# Patient Record
Sex: Female | Born: 1945 | Race: Black or African American | Hispanic: No | Smoking: Former smoker
Health system: Southern US, Community
[De-identification: ages and names within clinical notes are randomized; demographics above are authoritative.]

## PROBLEM LIST (undated history)

## (undated) DIAGNOSIS — I639 Cerebral infarction, unspecified: Secondary | ICD-10-CM

## (undated) DIAGNOSIS — Z9981 Dependence on supplemental oxygen: Secondary | ICD-10-CM

## (undated) DIAGNOSIS — I5030 Unspecified diastolic (congestive) heart failure: Secondary | ICD-10-CM

## (undated) DIAGNOSIS — I4892 Unspecified atrial flutter: Secondary | ICD-10-CM

## (undated) DIAGNOSIS — M797 Fibromyalgia: Secondary | ICD-10-CM

## (undated) DIAGNOSIS — K598 Other specified functional intestinal disorders: Secondary | ICD-10-CM

## (undated) DIAGNOSIS — K219 Gastro-esophageal reflux disease without esophagitis: Secondary | ICD-10-CM

## (undated) DIAGNOSIS — F329 Major depressive disorder, single episode, unspecified: Secondary | ICD-10-CM

## (undated) DIAGNOSIS — E785 Hyperlipidemia, unspecified: Secondary | ICD-10-CM

## (undated) DIAGNOSIS — M1990 Unspecified osteoarthritis, unspecified site: Secondary | ICD-10-CM

## (undated) DIAGNOSIS — M545 Low back pain, unspecified: Secondary | ICD-10-CM

## (undated) DIAGNOSIS — J189 Pneumonia, unspecified organism: Secondary | ICD-10-CM

## (undated) DIAGNOSIS — I509 Heart failure, unspecified: Secondary | ICD-10-CM

## (undated) DIAGNOSIS — J42 Unspecified chronic bronchitis: Secondary | ICD-10-CM

## (undated) DIAGNOSIS — E039 Hypothyroidism, unspecified: Secondary | ICD-10-CM

## (undated) DIAGNOSIS — N281 Cyst of kidney, acquired: Secondary | ICD-10-CM

## (undated) DIAGNOSIS — I219 Acute myocardial infarction, unspecified: Secondary | ICD-10-CM

## (undated) DIAGNOSIS — F419 Anxiety disorder, unspecified: Secondary | ICD-10-CM

## (undated) DIAGNOSIS — Z86010 Personal history of colonic polyps: Secondary | ICD-10-CM

## (undated) DIAGNOSIS — H348122 Central retinal vein occlusion, left eye, stable: Secondary | ICD-10-CM

## (undated) DIAGNOSIS — G8929 Other chronic pain: Secondary | ICD-10-CM

## (undated) DIAGNOSIS — G43909 Migraine, unspecified, not intractable, without status migrainosus: Secondary | ICD-10-CM

## (undated) DIAGNOSIS — J449 Chronic obstructive pulmonary disease, unspecified: Secondary | ICD-10-CM

## (undated) DIAGNOSIS — G4730 Sleep apnea, unspecified: Secondary | ICD-10-CM

## (undated) DIAGNOSIS — F32A Depression, unspecified: Secondary | ICD-10-CM

## (undated) DIAGNOSIS — I63512 Cerebral infarction due to unspecified occlusion or stenosis of left middle cerebral artery: Secondary | ICD-10-CM

## (undated) DIAGNOSIS — R0602 Shortness of breath: Secondary | ICD-10-CM

## (undated) DIAGNOSIS — E049 Nontoxic goiter, unspecified: Secondary | ICD-10-CM

## (undated) DIAGNOSIS — I10 Essential (primary) hypertension: Secondary | ICD-10-CM

## (undated) DIAGNOSIS — N182 Chronic kidney disease, stage 2 (mild): Secondary | ICD-10-CM

## (undated) HISTORY — PX: TONSILLECTOMY: SUR1361

## (undated) HISTORY — PX: CARPAL TUNNEL RELEASE: SHX101

## (undated) HISTORY — PX: TOTAL KNEE ARTHROPLASTY: SHX125

## (undated) HISTORY — PX: VAGINAL HYSTERECTOMY: SUR661

## (undated) HISTORY — PX: CHOLECYSTECTOMY OPEN: SUR202

## (undated) HISTORY — PX: LUMBAR DISC SURGERY: SHX700

## (undated) HISTORY — PX: BREAST BIOPSY: SHX20

## (undated) HISTORY — PX: JOINT REPLACEMENT: SHX530

## (undated) HISTORY — PX: POSTERIOR LUMBAR FUSION: SHX6036

## (undated) HISTORY — PX: ANTERIOR CERVICAL DECOMP/DISCECTOMY FUSION: SHX1161

## (undated) HISTORY — DX: Unspecified atrial flutter: I48.92

## (undated) HISTORY — PX: CARDIAC CATHETERIZATION: SHX172

## (undated) HISTORY — DX: Unspecified diastolic (congestive) heart failure: I50.30

## (undated) HISTORY — PX: BACK SURGERY: SHX140

---

## 1997-11-13 ENCOUNTER — Encounter: Payer: Self-pay | Admitting: Neurosurgery

## 1997-11-13 ENCOUNTER — Ambulatory Visit (HOSPITAL_COMMUNITY): Admission: RE | Admit: 1997-11-13 | Discharge: 1997-11-13 | Payer: Self-pay | Admitting: Neurosurgery

## 1997-12-11 ENCOUNTER — Encounter: Payer: Self-pay | Admitting: Neurosurgery

## 1997-12-11 ENCOUNTER — Inpatient Hospital Stay (HOSPITAL_COMMUNITY): Admission: RE | Admit: 1997-12-11 | Discharge: 1997-12-14 | Payer: Self-pay | Admitting: Neurosurgery

## 1998-07-22 ENCOUNTER — Encounter: Payer: Self-pay | Admitting: *Deleted

## 1998-07-22 ENCOUNTER — Ambulatory Visit (HOSPITAL_COMMUNITY): Admission: RE | Admit: 1998-07-22 | Discharge: 1998-07-22 | Payer: Self-pay | Admitting: *Deleted

## 1998-12-10 ENCOUNTER — Ambulatory Visit (HOSPITAL_COMMUNITY): Admission: RE | Admit: 1998-12-10 | Discharge: 1998-12-10 | Payer: Self-pay | Admitting: Gastroenterology

## 1998-12-10 ENCOUNTER — Encounter: Payer: Self-pay | Admitting: Gastroenterology

## 1998-12-17 ENCOUNTER — Ambulatory Visit (HOSPITAL_COMMUNITY): Admission: RE | Admit: 1998-12-17 | Discharge: 1998-12-17 | Payer: Self-pay | Admitting: *Deleted

## 1998-12-17 ENCOUNTER — Encounter: Payer: Self-pay | Admitting: *Deleted

## 1999-03-04 ENCOUNTER — Ambulatory Visit (HOSPITAL_COMMUNITY): Admission: RE | Admit: 1999-03-04 | Discharge: 1999-03-04 | Payer: Self-pay | Admitting: Gastroenterology

## 2001-10-18 ENCOUNTER — Encounter: Payer: Self-pay | Admitting: Cardiovascular Disease

## 2001-10-18 ENCOUNTER — Ambulatory Visit (HOSPITAL_COMMUNITY): Admission: RE | Admit: 2001-10-18 | Discharge: 2001-10-18 | Payer: Self-pay | Admitting: Cardiovascular Disease

## 2001-11-08 ENCOUNTER — Encounter: Payer: Self-pay | Admitting: Surgery

## 2001-11-10 ENCOUNTER — Encounter: Payer: Self-pay | Admitting: Surgery

## 2001-11-10 ENCOUNTER — Ambulatory Visit (HOSPITAL_COMMUNITY): Admission: AD | Admit: 2001-11-10 | Discharge: 2001-11-11 | Payer: Self-pay | Admitting: Surgery

## 2001-11-10 ENCOUNTER — Encounter (INDEPENDENT_AMBULATORY_CARE_PROVIDER_SITE_OTHER): Payer: Self-pay | Admitting: *Deleted

## 2002-06-06 ENCOUNTER — Encounter: Payer: Self-pay | Admitting: Orthopedic Surgery

## 2002-06-08 ENCOUNTER — Inpatient Hospital Stay (HOSPITAL_COMMUNITY): Admission: RE | Admit: 2002-06-08 | Discharge: 2002-06-13 | Payer: Self-pay | Admitting: Orthopedic Surgery

## 2002-06-08 ENCOUNTER — Encounter: Payer: Self-pay | Admitting: Orthopedic Surgery

## 2003-09-03 ENCOUNTER — Ambulatory Visit (HOSPITAL_COMMUNITY): Admission: RE | Admit: 2003-09-03 | Discharge: 2003-09-03 | Payer: Self-pay | Admitting: *Deleted

## 2003-09-14 ENCOUNTER — Ambulatory Visit (HOSPITAL_COMMUNITY): Admission: RE | Admit: 2003-09-14 | Discharge: 2003-09-14 | Payer: Self-pay | Admitting: *Deleted

## 2004-01-29 ENCOUNTER — Encounter: Admission: RE | Admit: 2004-01-29 | Discharge: 2004-01-29 | Payer: Self-pay | Admitting: Orthopedic Surgery

## 2004-02-17 HISTORY — PX: COLONOSCOPY: SHX174

## 2004-02-17 HISTORY — PX: ESOPHAGOGASTRODUODENOSCOPY: SHX1529

## 2004-02-21 ENCOUNTER — Ambulatory Visit: Payer: Self-pay | Admitting: Internal Medicine

## 2004-02-27 ENCOUNTER — Ambulatory Visit: Payer: Self-pay | Admitting: Internal Medicine

## 2004-02-27 ENCOUNTER — Ambulatory Visit (HOSPITAL_COMMUNITY): Admission: RE | Admit: 2004-02-27 | Discharge: 2004-02-27 | Payer: Self-pay | Admitting: Internal Medicine

## 2004-05-13 ENCOUNTER — Encounter: Admission: RE | Admit: 2004-05-13 | Discharge: 2004-05-13 | Payer: Self-pay | Admitting: Orthopedic Surgery

## 2004-05-14 ENCOUNTER — Ambulatory Visit (HOSPITAL_BASED_OUTPATIENT_CLINIC_OR_DEPARTMENT_OTHER): Admission: RE | Admit: 2004-05-14 | Discharge: 2004-05-14 | Payer: Self-pay | Admitting: Orthopedic Surgery

## 2004-05-14 ENCOUNTER — Encounter (INDEPENDENT_AMBULATORY_CARE_PROVIDER_SITE_OTHER): Payer: Self-pay | Admitting: Specialist

## 2004-05-14 ENCOUNTER — Ambulatory Visit (HOSPITAL_COMMUNITY): Admission: RE | Admit: 2004-05-14 | Discharge: 2004-05-14 | Payer: Self-pay | Admitting: Orthopedic Surgery

## 2005-03-05 ENCOUNTER — Ambulatory Visit: Payer: Self-pay | Admitting: Psychology

## 2005-08-31 ENCOUNTER — Encounter: Admission: RE | Admit: 2005-08-31 | Discharge: 2005-09-30 | Payer: Self-pay | Admitting: Neurology

## 2005-09-04 ENCOUNTER — Ambulatory Visit (HOSPITAL_COMMUNITY): Admission: RE | Admit: 2005-09-04 | Discharge: 2005-09-04 | Payer: Self-pay | Admitting: *Deleted

## 2006-02-15 ENCOUNTER — Encounter: Admission: RE | Admit: 2006-02-15 | Discharge: 2006-02-15 | Payer: Self-pay | Admitting: Neurology

## 2006-06-16 ENCOUNTER — Ambulatory Visit (HOSPITAL_COMMUNITY): Admission: RE | Admit: 2006-06-16 | Discharge: 2006-06-16 | Payer: Self-pay | Admitting: Neurosurgery

## 2006-07-22 ENCOUNTER — Inpatient Hospital Stay (HOSPITAL_COMMUNITY): Admission: RE | Admit: 2006-07-22 | Discharge: 2006-07-27 | Payer: Self-pay | Admitting: Neurosurgery

## 2007-04-19 ENCOUNTER — Ambulatory Visit: Payer: Self-pay | Admitting: Cardiology

## 2007-04-20 ENCOUNTER — Encounter: Payer: Self-pay | Admitting: Cardiology

## 2007-07-05 ENCOUNTER — Emergency Department (HOSPITAL_COMMUNITY): Admission: EM | Admit: 2007-07-05 | Discharge: 2007-07-05 | Payer: Self-pay | Admitting: Emergency Medicine

## 2007-07-25 ENCOUNTER — Ambulatory Visit (HOSPITAL_COMMUNITY): Admission: RE | Admit: 2007-07-25 | Discharge: 2007-07-25 | Payer: Self-pay | Admitting: Neurosurgery

## 2008-02-13 ENCOUNTER — Ambulatory Visit (HOSPITAL_COMMUNITY): Admission: RE | Admit: 2008-02-13 | Discharge: 2008-02-13 | Payer: Self-pay | Admitting: *Deleted

## 2009-02-16 HISTORY — PX: COLONOSCOPY: SHX174

## 2009-03-07 ENCOUNTER — Ambulatory Visit (HOSPITAL_COMMUNITY): Admission: RE | Admit: 2009-03-07 | Discharge: 2009-03-07 | Payer: Self-pay | Admitting: *Deleted

## 2009-04-19 ENCOUNTER — Encounter: Payer: Self-pay | Admitting: Internal Medicine

## 2009-04-23 ENCOUNTER — Ambulatory Visit (HOSPITAL_COMMUNITY): Admission: RE | Admit: 2009-04-23 | Discharge: 2009-04-23 | Payer: Self-pay | Admitting: Internal Medicine

## 2009-04-23 ENCOUNTER — Ambulatory Visit: Payer: Self-pay | Admitting: Internal Medicine

## 2009-04-25 ENCOUNTER — Encounter: Payer: Self-pay | Admitting: Internal Medicine

## 2010-03-09 ENCOUNTER — Encounter: Payer: Self-pay | Admitting: Neurology

## 2010-03-20 NOTE — Letter (Signed)
Summary: Patient Notice, Colon Biopsy Results  [facility]  [address]   [place], [place] [address]   Phone: [phone]  Fax: [phone]       April 25, 2009   Holly Flores [address] [place], [place]  [address] [birth year]    Dear Ms. Habermann,  I am pleased to inform you that the biopsies taken during your recent colonoscopy did not show any evidence of cancer upon pathologic examination.  Additional information/recommendations:  You should have a repeat colonoscopy examination  in 5 years.  Please call us if you are having persistent problems or have questions about your condition that have not been fully answered at this time.  Sincerely,    R. Roetta Sessions MD  [facility] Ph: [phone]    Fax: [phone]   Appended Document: Patient Notice, Colon Biopsy Results Letter mailed.

## 2010-03-20 NOTE — Letter (Signed)
Summary: TRIAGE  TRIAGE   Imported By: Diana Eves 04/19/2009 11:45:05  _____________________________________________________________________  External Attachment:    Type:   Image     Comment:   External Document

## 2010-03-27 ENCOUNTER — Other Ambulatory Visit: Payer: Self-pay

## 2010-03-27 DIAGNOSIS — Z1231 Encounter for screening mammogram for malignant neoplasm of breast: Secondary | ICD-10-CM

## 2010-04-02 ENCOUNTER — Ambulatory Visit (HOSPITAL_COMMUNITY)
Admission: RE | Admit: 2010-04-02 | Discharge: 2010-04-02 | Disposition: A | Discharge status: Home / Self Care | Payer: MEDICARE | Source: Ambulatory Visit | Attending: *Deleted | Admitting: *Deleted

## 2010-04-02 DIAGNOSIS — Z1231 Encounter for screening mammogram for malignant neoplasm of breast: Secondary | ICD-10-CM | POA: Insufficient documentation

## 2010-06-12 ENCOUNTER — Other Ambulatory Visit: Payer: Self-pay | Admitting: Urology

## 2010-06-12 DIAGNOSIS — D4959 Neoplasm of unspecified behavior of other genitourinary organ: Secondary | ICD-10-CM

## 2010-06-17 ENCOUNTER — Ambulatory Visit
Admission: RE | Admit: 2010-06-17 | Discharge: 2010-06-17 | Disposition: A | Discharge status: Home / Self Care | Payer: MEDICARE | Source: Ambulatory Visit | Attending: Urology | Admitting: Urology

## 2010-06-17 DIAGNOSIS — D4959 Neoplasm of unspecified behavior of other genitourinary organ: Secondary | ICD-10-CM

## 2010-06-17 MED ORDER — GADOBENATE DIMEGLUMINE 529 MG/ML IV SOLN
20.0000 mL | Freq: Once | INTRAVENOUS | Status: AC | PRN
  Administered 2010-06-17: 20 mL via INTRAVENOUS

## 2010-06-20 ENCOUNTER — Other Ambulatory Visit (HOSPITAL_COMMUNITY): Payer: Self-pay | Admitting: *Deleted

## 2010-06-20 ENCOUNTER — Other Ambulatory Visit: Payer: Self-pay | Admitting: Urology

## 2010-06-20 DIAGNOSIS — Z1231 Encounter for screening mammogram for malignant neoplasm of breast: Secondary | ICD-10-CM

## 2010-06-21 ENCOUNTER — Ambulatory Visit
Admission: RE | Admit: 2010-06-21 | Discharge: 2010-06-21 | Disposition: A | Discharge status: Home / Self Care | Payer: MEDICARE | Source: Ambulatory Visit | Attending: Urology | Admitting: Urology

## 2010-06-21 MED ORDER — GADOBENATE DIMEGLUMINE 529 MG/ML IV SOLN
20.0000 mL | Freq: Once | INTRAVENOUS | Status: AC | PRN
  Administered 2010-06-21: 20 mL via INTRAVENOUS

## 2010-07-01 NOTE — Op Note (Signed)
NAMEBASYA, CASAVANT NO.:  [id number]   MEDICAL RECORD NO.:  [id number]          PATIENT TYPE:  INP   LOCATION:  3004                         FACILITY:  [facility]   PHYSICIAN:  Danae Orleans. Venetia Maxon, M.D.  DATE OF BIRTH:  1945-09-10   DATE OF PROCEDURE:  07/22/2006  DATE OF DISCHARGE:                               OPERATIVE REPORT   PREOPERATIVE DIAGNOSIS:  Status post L4-5 fusion with L4, 5, S1  spondylolisthesis, spondylosis, degenerative disease and radiculopathy  and low back pain.   POSTOPERATIVE DIAGNOSIS:  Status post L4-5 fusion with L4, 5, S1  spondylolisthesis, spondylosis, degenerative disease and radiculopathy  and low back pain.   PROCEDURE:  1. Exploration of L4-5 fusion.  2. Decompression at L5-S1.  3. Posterior lumbar interbody fusion with PEEK interbody cages with      morselized bone autograft and Osteocell.  4. Pedicle screw fixation nonsegmental instrumentation L5-S1 levels.  5. Posterolateral arthrodesis L5-S1 levels.  6. Nerve monitoring, EMG nerve monitoring.   SURGEON:  Danae Orleans. Venetia Maxon, M.D.   ASSISTANT:  Hilda Lias, M.D. and Georgiann Cocker, RN   ANESTHESIA:  General endotracheal anesthesia.   BLOOD LOSS:  150 mL.   COMPLICATIONS:  None.   DISPOSITION:  Recovery.   INDICATIONS:  Isyss Espinal is a 65 year old woman previously  undergone L4-5 Ray threaded fusion cage surgery who now has developed  back pain, spondylolisthesis of L5 and S1, spondylosis, degenerative  disk disease, radiculopathy.  Was elected to take her to surgery for  decompression and fusion.   PROCEDURE:  Ms. Romeo Apple was brought to the operating room.  Following  satisfactory and uncomplicated option of general endotracheal  anesthesia, placement intravenous lines, and Foley catheter the patient  was placed in a prone position on the [place] table.  Her low back was  then prepped and draped in the usual sterile fashion.  Area of planned  incision was  infiltrated with 0.25% Marcaine and 0.5% lidocaine with  1:200,000 epinephrine.  Incision was made midline, carried through  copious adipose tissue to the lumbodorsal fascia.  Subperiosteal  dissection was performed exposing the L5 transverse process, the sacral  alae bilaterally.  Self-retaining retractor was placed facilitate  exposure.  The intraoperative x-ray was obtained confirming correct  level of the L5-S1 level.  There was significant degenerative changes at  the L5-S1 level with marked spondylosis and facet arthropathic changes  with significant ligamentous hypertrophy.  The L4-5 level was explored.  There was no evidence of motion on moving spinous processes.  There  appeared to be solid arthrodesis at this level.  The removal of spinous  process was then performed of L5. The ligamentous tissue was highly  hypertrophied, this was removed and a dilator probe was inserted  initially on the left side to the pedicle and then out the neural  foramen and subsequently a similar probe was placed on the right side to  access the foramen.  The facetectomy was then completed with removal of  the medial aspect of the facet joint at L5-S1 bilaterally and the  superior articular process of the L5.  The S1 facette was removed with  resultant decompression of the both the L5 and S1 nerve roots.  A  diskectomy was then performed on the left side of midline and the  interspace was aggressively cleared of disk material.  Endplates were  prepared for later grafting.  After trial sizing a 10-mm interbody  spacer was then placed.  A trial spacer was then placed and subsequent  diskectomy was performed on the right side of midline as well.  Subsequently after a thorough diskectomy was performed endplate  preparation, 10-mm PEEK interbody cage was packed with morcellized bone  autograft and also Osteocell.  This was inserted the interspace  countersunk appropriately.  Additional bone material was  placed within  the interspace and subsequently tamped into position and subsequently on  the left side of midline a similarly sized cage was placed and tamped  into position.  Additional bone graft was placed overlying the L5-S1  level was then decorticated and pedicle screw fixation was placed at 6.5  x 40 mm sacral screws bilaterally and 6.5 x 45-mm screws at L5.  EMG  monitoring demonstrated no evidence of any cutouts were suggestion of  nerve root irritability.  The positioning of the screws was confirmed on  AP and lateral fluoroscopy.  The remaining bone graft was placed  posterolateral region, tamped into position.  40-mm rods were placed  overlying the screws and locked down in situ.  The wound was irrigated  and soft tissues inspected and found to be in good repair.  The  lumbodorsal fascia was then closed with 1-0 Vicryl sutures, subcutaneous  tissues were approximated 2-0 Vicryl interrupted inverted sutures and  skin edges were approximated 3-0 Vicryl subcu stitch.  Wound was dressed  with Benzoin, Steri-Strips soft gauze and tape.  The patient was  extubated in the operating, taken to recovery in stable satisfactory  condition having tolerated operation well.  Counts correct at end the  case.      Danae Orleans. Venetia Maxon, M.D.  Electronically Signed     JDS/MEDQ  D:  07/22/2006  T:  07/23/2006  Job:  [id number]

## 2010-07-04 NOTE — Consult Note (Signed)
NAME:  Holly Flores, Holly Flores             ACCOUNT NO.:  [id number]   MEDICAL RECORD NO.:  [id number]          PATIENT TYPE:  OUT   LOCATION:  RDC                           FACILITY:  [facility]   PHYSICIAN:  R. Roetta Sessions, M.D. DATE OF BIRTH:  Nov 15, 1945   DATE OF CONSULTATION:  02/21/2004  DATE OF DISCHARGE:                                   CONSULTATION   REQUESTING PHYSICIAN:  Samuel Jester   REASON FOR CONSULTATION:  Persistent heart burn, history of gastric ulcer  from abdominal bloating, occult blood in stool.   HISTORY OF PRESENT ILLNESS:  Holly Flores is a 65 year old lady who presents  today for further evaluation of above stated symptoms.  She states she has a  long history of gastroesophageal reflux disease.  She describes incidence of  inhaling chlorine gas in the last 1980's and since that time has been  disabled and had chronic and infrequently intractable heart burn symptoms.  She also describes having history of peptic ulcer disease.  She has been  seen by gastroenterologist in [place].  She describes having multiple  endoscopies, both colonoscopies and EGD's.  About three years ago she was  referred to [facility], and at that time, she described an esophageal dilatation.  She was placed on Prilosec in the morning and Nexium at night and did fairly  well for a couple of years.  However, back in October 2005, she notes that  her Synthroid medication was increased.  She was also advised to take on an  empty stomach.  Since that time, she has felt epigastric burning, typical  heartburn symptoms, indigestion.  She feels pain when she swallows.  She  also feels food and pills becoming lodged in her esophagus.  She describes  water brash.  Over the last 2-3 weeks, symptoms have been worsening.  She  also complains of sore throat with it.  She has intermittent nausea and  vomiting, and she has had hematemesis.  Last week as she passed a dark  stool, and this also had fresh blood on it.   She generally has a bowel  movement once daily, but she was having diarrhea then.  She notes greasy  foods and certain liquids makes her epigastric pain and heartburn worse.  She complains of abdominal bloating and pressure.  She is eating six small  meals daily which seems to help.  She reports having three positive  hemoccult cards recently.  I do not have a current CBC.   CURRENT MEDICATIONS:  1.  Nexium 40 mg daily.  2.  Toprol XL 50 mg daily.  3.  Demadex 20 mg daily.  4.  Altace 5 mg daily.  5.  Xanax 2 mg b.i.d.  6.  Synthroid 150 mg daily.  7.  Prozac 20 mg daily.  8.  Serevent two puffs daily.  9.  Proventil two puffs daily.  10. Percocet 500 mg p.r.n.  11. Nebulizer daily.  12. Mylanta daily.  13. Centrum Silver daily.  14. Caltrate daily.   ALLERGIES:  PENICILLIN AND SULFA.   PAST MEDICAL HISTORY:  1.  History of  MI, but she reports no damage was done.  2.  History of CHF.  3.  Hypothyroidism.  4.  Hiatal hernia.  5.  Remote peptic ulcer disease in the early 80's.  6.  History of esophageal dilatation.  7.  Colonic polyps and states she was supposed to have a follow up      colonoscopy last year but she was unable to due to other surgeries.  8.  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  9.  Three titanium disks put in her lumbar back region.  10. Complete hysterectomy.  11. Tonsillectomy.  12. Heart catheterization.  13. Benign breast lumpectomy.  14. Cholecystectomy for stones in September 2005.   FAMILY HISTORY:  Significant for diabetes.  Mother had bone cancer.  Negative for colorectal cancer.  Brother had ulcers.   SOCIAL HISTORY:  She is married.  She has been disabled since 1989.  Denies  any tobacco or alcohol use.   REVIEW OF SYSTEMS:  See HPI for GI.  Cardiopulmonary:  Complains of some  dyspnea on exertion.  GU:  Denies any dysuria.   PHYSICAL EXAMINATION:  VITAL SIGNS:  Weight 236, temperature 98, blood  pressure 110/90, pulse 88.   GENERAL APPEARANCE:  Pleasant, well-nourished, well-developed black female  in no acute distress.  SKIN:  Warm and dry, no jaundice.  HEENT:  Conjunctivae are pink.  Sclerae nonicteric.  Oropharyngeal and  mucosa moist and pink.  No lesion, erythema or exudate.  No lymphadenopathy,  thyromegaly.  CHEST:  Lungs are clear to auscultation.  CARDIAC:  Regular rate and rhythm.  Normal S1, S2.  No murmurs, rubs or  gallops.  ABDOMEN:  Positive bowel sounds.  Obese but symmetrical.  Soft.  She had  mild epigastric tenderness to deep palpation.  No organomegaly or masses.  No rebound tenderness or guarding.  EXTREMITIES:  No edema.   IMPRESSION:  Holly Flores is a 65 year old lady with intractable  gastroesophageal reflux disease, epigastric pain, dysphagia and odynophagia.  She was recently found to have hemoccult positive stool.  She has remote  history of peptic ulcer disease.  She definitely needs to have an EGD for  further evaluation of these symptoms.  In addition, she tells me she is  overdue for a surveillance colonoscopy given personal history of colonic  polyps.  She would like to arrange for that today.  She says she needs  antibiotics for procedures, and therefore, will provide SBE prophylaxis.  I  discussed risks, alternatives and benefits of EGD and colonoscopy with  regards to reactions to medications, bleeding, infection, perforation.  She  understands and is agreeable to proceed.   PLAN:  1.  Colonoscopy with EGD +/- esophageal dilatation.  2.  Will provide SBE prophylaxis.  3.  She will continue Nexium 40 mg daily for now.  4.  Will obtain a CBC at time of endoscopy.  5.  Further recommendations to follow.   I would like to thank Dr. Samuel Jester for allowing [place] to take part in the  care of this patient.     Lesl   LL/MEDQ  D:  02/21/2004  T:  02/21/2004  Job:  [id number]   cc:   Leo [address]  [place]  [place] [address]  Fax: [phone]

## 2010-07-04 NOTE — Discharge Summary (Signed)
[name], [name] NO.:  [id number]   MEDICAL RECORD NO.:  [id number]          PATIENT TYPE:  INP   LOCATION:  3004                         FACILITY:  [facility]   PHYSICIAN:  Danae Orleans. Venetia Maxon, M.D.  DATE OF BIRTH:  1945-10-29   DATE OF ADMISSION:  07/22/2006  DATE OF DISCHARGE:  07/27/2006                               DISCHARGE SUMMARY   REASON FOR ADMISSION:  1. Lumbar disk degeneration.  2. Acquired spondylolisthesis.  3. Lumbosacral spondylosis.  4. Hypertension, not otherwise specified.  5. Congestive heart failure.  6. Chronic airway obstructive disease.  7. History of tobacco use.  8. History of SULFONAMIDE ALLERGY.  9. History of NARCOTIC ALLERGY.   FINAL DIAGNOSES:  1. Lumbar disk degeneration.  2. Acquired spondylolisthesis.  3. Lumbosacral spondylosis.  4. Hypertension, not otherwise specified.  5. Congestive heart failure.  6. Chronic airway obstructive disease.  7. History of tobacco use.  8. History of SULFONAMIDE ALLERGY.  9. History of NARCOTIC ALLERGY.   HISTORY OF PRESENT ILLNESS AND HOSPITAL COURSE:  Holly Flores is a 65-  year-old woman who had previously undergone decompression and fusion at  L4-5 who now developed spondylolisthesis and spondylosis with disk  degeneration at the L5-S1 level.  It was elected to admit her to the  hospital, and she underwent uncomplicated decompression and fusion at  the L5-S1 level.   Postoperatively, she was gradually mobilized, was doing well, and was  discharged home on the 10th with home health nursing, PT, rolling  equipment, and Keflex 500 mg q.i.d. for some incisional drainage and  Valium as needed for muscular spasm.      Danae Orleans. Venetia Maxon, M.D.  Electronically Signed     JDS/MEDQ  D:  09/23/2006  T:  09/24/2006  Job:  [id number]

## 2010-07-04 NOTE — Op Note (Signed)
NAME:  Holly Flores, Holly Flores                       ACCOUNT NO.:  [id number]   MEDICAL RECORD NO.:  [id number]                   PATIENT TYPE:  INP   LOCATION:  5705                                 FACILITY:  [facility]   PHYSICIAN:  Sandria Bales. Ezzard Standing, MD                 DATE OF BIRTH:  October 12, 1945   DATE OF PROCEDURE:  11/10/2001  DATE OF DISCHARGE:  11/11/2001                                 OPERATIVE REPORT   PREOPERATIVE DIAGNOSES:  Chronic cholecystitis with cholelithiasis.   POSTOPERATIVE DIAGNOSES:  Chronic cholecystitis with cholelithiasis.   PROCEDURE:  Laparoscopic cholecystectomy with intraoperative cholangiogram.   SURGEON:  Sandria Bales. Ezzard Standing, M.D.   FIRST ASSISTANT:  Ollen Gross. Carolynne Edouard, M.D.   ANESTHESIA:  General endotracheal.   ESTIMATED BLOOD LOSS:  Minimal.   INDICATION FOR PROCEDURE:  The patient is a 65 year old black female who  comes with symptomatic cholelithiasis.  She is moderately obese, has known  coronary artery disease, followed by Dr. Nanetta Batty.  I discussed both  the indications, potential complications including but not limited to  infection, bleeding, bile duct injury, bowel injury, and no surgery to the  patient.  The patient now comes for attempted laparoscopic cholecystectomy.   DESCRIPTION OF PROCEDURE:  The patient was placed in the supine position,  given a general endotracheal anesthetic.  She had PA stockings placed and  was given 1 g of Ancef at the initiation of this procedure.  Her abdomen was  prepped with Betadine solution and sterilely draped. An infraumbilical  incision was made with sharp dissection and carried down through the  abdominal cavity.  The patient was noted to have some adhesions in the lower  part of her abdomen and these were due to lower midline incisions for C-  sections.  When I got into the abdomen, the 0-degree laparoscope was  inserted through a 12-mm Hasson trocar and the Hasson trocar was secured  with a 0 Vicryl  suture.  I placed three additional trocars, a 10-mm  subxiphoid trocar, a 5-mm right mid subcostal and a 5-mm lateral subcostal.  The patient actually had a fair amount of intra-abdominal obesity and fat  which really limited part of the exposure.  I actually had to switch from a  0 to a 30-degree scope to see the gallbladder and its entire anatomy. I was  able to dissect out the cystic duct.  The patient had some adhesions of the  duodenum to the gallbladder which were taken down.  I identified the cystic  duct and cystic artery and triangle of Calot.  The cystic artery was triply  ligated and divided.  The cystic lymph node, or Calot lymph node, was  identified and pulled down.  I then dissected out at the gallbladder and  cystic duct junction.  A clip was placed in the gallbladder side of the  cystic duct.  An intraoperative cholangiogram was obtained.  An  intraoperative cholangiogram was obtained using a cut-off taut catheter  inserted through a 14-gauge Jelco catheter and inserted to the side of the  cystic duct.  Contrast flowed freeing in the common bile duct into the  duodenum and went back up the hepatic radicles.  This appeared to be a  normal intraoperative cholangiogram.  The taut catheter was then removed.  The cystic duct was triply endoclipped and divided.  The gallbladder was  removed sharply with blunt dissection from the gallbladder bed.  Prior to  complete dissection of the gallbladder from the gallbladder bed, I  revisualized the triangle of Calot in the gallbladder bed and there was no  bleeding or bile leak.  The gallbladder was then delivered through the  umbilicus in an EndoCatch bag.  The umbilical port was closed with a 0  Vicryl suture.  I visualized each port and there was no bleeding at any of  these port sites.  The ports were then all removed, the abdomen irrigated  with about 500 cc of saline.  The skin incision site was closed with a 5-0  Vicryl suture.   Each site had been infiltrated with 0.25% Marcaine using  about 12 cc total.  The wounds were then Steri-Stripped and sterilely  dressed.  The patient tolerated the procedure well and was transported to  the recovery room in good condition.  The sponge and needle count were  correct at the end of the case.                                               Sandria Bales. Ezzard Standing, MD    DHN/MEDQ  D:  11/10/2001  T:  11/11/2001  Job:  [id number]   cc:   Lynnae Sandhoff, M.D.  [address]  [place]  [place] [address]  Fax: [phone]   Eino Farber., M.D.  [address] [place]  [place] [address]  Fax: [phone]

## 2010-07-04 NOTE — Discharge Summary (Signed)
NAME:  HUGH, GARROW                       ACCOUNT NO.:  [id number]   MEDICAL RECORD NO.:  [id number]                   PATIENT TYPE:  INP   LOCATION:  5035                                 FACILITY:  [facility]   PHYSICIAN:  Loreta Ave, M.D.              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   DISCHARGE DIAGNOSES:  1. 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  2. History of congestive heart failure.  3. Hypertension.  4. asthma.   PROCEDURE:  Left total knee replacement.   HISTORY OF PRESENT ILLNESS:  The patient is a 65 year old female with  radiographic end-stage DJD of the left knee.  Previous scope by Dr. Lajoyce Corners  several years ago revealed significant degenerative changes.  She now has  pain at 8/10 affecting her on a daily basis with her activities.  She also  has rest pain.  She is now indicated for a left total knee replacement.   HOSPITAL COURSE:  This 65 year old female was admitted June 08, 2002.  After appropriate laboratory studies were obtained as well as 1 g of  vancomycin IV on call to the operating room, was taken to the operating room  where she underwent a left total knee replacement.  She tolerated the  procedure well.  She was placed on a Dilaudid PCA pump for postoperative  pain management.  Continued on vancomycin 1 g IV q.12h. for two more doses.  Consultations with PT and OT were made.  CPM from 0-30 degrees for eight  hours per day, increasing daily by 10 degrees was also instituted.  Ambulation and PT, weightbearing as tolerated.  Foley was placed  intraoperatively.  She was allowed out of bed to a chair the following day.  She did develop some hypokalemia which was treated with KCl 20 mEq p.o.  b.i.d. x24 hours.  She did not tolerate her iron sulfate, and  this was  discontinued on June 12, 2002.  She improved with her physical and  occupational therapy.  Once she was ambulatory and stable she was discharged  on June 13, 2002, to return back to the office in 10 days for follow-up.   EKG showed normal sinus rhythm with nonspecific T-wave abnormality.   Radiographic studies reveal anatomic alignment status post left total knee  replacement.   Admission hemoglobin 13.6, hematocrit 40.1%, white count 9100, platelets  273,000.  Discharge hemoglobin 10.3, hematocrit 30.3%, white count 12,000,  with platelet count 289,000.  Discharge pro time was 23.4 with an INR of  2.4.  Preoperative chemistries:  Sodium 141, potassium 3.6, chloride 104,  CO2 34, glucose 91, BUN 11, creatinine 1.2, calcium 9.2, total protein 7.3,  albumin 3.8, AST 16, ALT 12, ALP 73, total bilirubin  0.3.  Discharge sodium  139, potassium 3.2, chloride 102, CO2 30, glucose 116, BUN 9, creatinine  1.3, calcium 8.6.  Urinalysis revealed a small amount of bilirubin,  otherwise unremarkable.  Blood type was B positive, antibody screen  negative.   MEDICATIONS:  1. She was given a prescription for Percocet 5/325 1-2 tablets every four     hours as needed for pain.  2. Coumadin 5 mg as per [facility].  3. Colace 100 mg b.i.d.  4. Continue home medications.   ACTIVITY:  As taught in physical therapy.   DIET:  No restrictions.  She is to eat a banana a day for one week.   WOUND CARE:  Keep the wound clean and dry.  Cover with a sterile dressing.   FOLLOW-UP:  Follow the instructions sheet.  Follow back up in the office in  10 days for follow-up.   CONDITION ON DISCHARGE:  Improved.     Oris Drone Petrarca, P.A.-C.                Loreta Ave, M.D.    BDP/MEDQ  D:  07/10/2002  T:  07/10/2002  Job:  [id number]

## 2010-07-04 NOTE — Cardiovascular Report (Signed)
NAME:  Holly Flores, Holly Flores                       ACCOUNT NO.:  [id number]   MEDICAL RECORD NO.:  [id number]                   PATIENT TYPE:  OIB   LOCATION:  2875                                 FACILITY:  [facility]   PHYSICIAN:  Runell Gess, M.D.             DATE OF BIRTH:  Oct 13, 1945   DATE OF PROCEDURE:  DATE OF DISCHARGE:                              CARDIAC CATHETERIZATION   INDICATIONS FOR PROCEDURE:  This is a 65 year old mildly overweight black  female with a history of a cardiac catheterization in 1998, which showed a  mid LAD myocardial bridge.  She has been complaining of chest pain.  A  Cardiolite performed on August 26, 2001, showed a mild amount of central  ischemia.  She was initially treated in the emergency room for chest  pain/right upper quadrant pain, and was found to have cholelithiasis.  She  presents now for a diagnostic coronary angiography, to rule out an ischemic  etiology to her chest pain, and to risk stratify her prior to a  cholecystectomy.   DESCRIPTION OF PROCEDURE:  The patient was brought to the second floor [facility]iac [facility]atheter[facility] in the  postabsorptive state.  She was premedicated with p.o. Valium.  Her right  groin was prepped and shaved in the usual sterile fashion.  Xylocaine 1% was  used for local anesthesia.  A 6-French sheath was inserted into the right  femoral artery using the standard Seldinger technique.  The 6-French right  and left Judkins dilator catheters along with a 6-French pigtail catheter  were used for selective coronary angiography and a left ventriculography  respectively.  Omnipaque dye was used for the entirety of the case.  Retrograde aortic and left ventricular and pullback pressures were recorded.   HEMODYNAMICS:  Aortic systolic pressure:  123.  Aortic diastolic pressure:  78.  Left ventricular systolic pressure:  120.  Left ventricular diastolic pressure:  18.   CORONARY  ANGIOGRAPHY:  1. Left main:  The left main normal.  2. LAD:  The LAD had a 30%-40% mid systolic bridge after a diagonal branch.  3. Left circumflex coronary artery:  Free of significant disease.  4. Right coronary artery:  This a large dominant vessel which was free of     significant disease.   LEFT VENTRICULOGRAPHY:  The RAO left ventriculogram was performed using 25  cc of Omnipaque dye at 12 cc per second.  The overall LVEF was estimated at  greater than 60% without focal wall motion abnormality.   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  I suspect that her chest  pain is related to cholelithiasis.   The sheath was removed and pressure held on the groin to achieve hemostasis.  The patient left the cath lab in  stable condition.    DISPOSITION:  She will be discharged home today to be followed as an  outpatient.  She will see me back in the office in several weeks.  She is  cleared to undergo an elective cholecystectomy, at a low cardiovascular  risk.  She left the laboratory in stable condition.  Dr. Samuel Jester was  notified of these results.                                                Runell Gess, M.D.    JJB/MEDQ  D:  10/18/2001  T:  10/18/2001  Job:  [id number]   cc:   Cardiac Cath Lab Second Floor Nurses Station   [facility]  [address]  [place], [place] [address]   Samuel Jester

## 2010-07-04 NOTE — Op Note (Signed)
NAMEMAKIA, Holly Flores             ACCOUNT NO.:  [id number]   MEDICAL RECORD NO.:  [id number]          PATIENT TYPE:  AMB   LOCATION:  DSC                          FACILITY:  [facility]   PHYSICIAN:  Artist Pais. Weingold, M.D.DATE OF BIRTH:  [birth year]   DATE OF PROCEDURE:  05/14/2004  DATE OF DISCHARGE:                                 OPERATIVE REPORT   PREOPERATIVE DIAGNOSIS:  Symptomatic left wrist volar mass.   POSTOPERATIVE DIAGNOSIS:  Symptomatic left wrist volar mass.   PROCEDURE:  Excision and biopsy of mass left wrist.   SURGEON:  Artist Pais. Mina Marble, M.D.   ASSISTANT:  Aura Fey. Bobbe Medico.   ANESTHESIA:  General anesthesia.   TOURNIQUET TIME:  20 minutes.   COMPLICATIONS:  None.   DRAINS:  None.   SPECIMENS:  One specimen sent.   DESCRIPTION OF PROCEDURE:  Patient was taken to the operating room.  After  induction of adequate general anesthesia, left upper extremity is prepped  and draped in usual sterile fashion.  Esmarch was used to exsanguinate the  limb.  Tourniquet inflated to 250 mmHg.  At this point in time, a  longitudinal incision was made over the dorsal radial aspect of the left  wrist.  Incision taken down through the skin and subcutaneous tissue 3 cm in  length.  Dissection was carried down to the radial artery.  It was carefully  dissected off an underlying mass that appeared to be an old calcified  ganglion cyst.  It was carefully dissected off the cyst.  Cyst was traced  down to the stalk and removed in its entirety.  Wound was thoroughly  irrigated.  Hemostasis achieved with bipolar electrocautery.  It was loosely  closed with running 3-0 Prolene subcuticular stitch.  Steri-Strips, 4x4s,  fluffs and volar splint was applied.  Tolerated the procedure well and went  to the recovery room in stable fashion.      MAW/MEDQ  D:  05/14/2004  T:  05/14/2004  Job:  [id number]

## 2010-07-04 NOTE — Op Note (Signed)
NAME:  Holly Flores, Holly Flores                       ACCOUNT NO.:  [id number]   MEDICAL RECORD NO.:  [id number]                   PATIENT TYPE:  INP   LOCATION:  2899                                 FACILITY:  [facility]   PHYSICIAN:  Loreta Ave, M.D.              DATE OF BIRTH:  [birth year]   DATE OF PROCEDURE:  06/08/2002  DATE OF DISCHARGE:                                 OPERATIVE REPORT   PREOPERATIVE DIAGNOSES:  End stage degenerative arthritis left knee with  varus alignment.   POSTOPERATIVE DIAGNOSES:  End stage degenerative arthritis left knee with  varus alignment.   OPERATION PERFORMED:  Left total knee replacement with soft tissue  balancing.  Cemented #7 posterior stabilized femoral component.  Cemented #7  tibial component with a 10 mm posterior stabilized polyethylene insert.  Cemented recessed 26 mm patellar component.   SURGEON:  Loreta Ave, M.D.   ASSISTANT:  Arlys John D. Petrarca, P.A.-C.   ANESTHESIA:  General.   ESTIMATED BLOOD LOSS:  Minimal.   TOURNIQUET TIME:  One hour 20 minutes.   SPECIMENS:  Excised bone and soft tissue.   CULTURES:  None.   COMPLICATIONS:  None.   DRESSING:  Soft compressive with knee immobilizer.   DRAINS:  Hemovac times two.   DESCRIPTION OF PROCEDURE:  The patient was brought to the operating room and  placed on the operating table in supine position.  After adequate anesthesia  had been obtained, a tourniquet was applied to the __________ aspect of the  left leg.  Prepped and draped in the usual sterile fashion.  Exsanguinated  with elevation and Esmarch.  Tourniquet inflated to 375 mmHg.  Exposure exam  all limited by exogenous obesity.  She did have full extension varus  alignment correctable to neutral but flexion only to 90 degrees because of  adiposity between the calf and the thigh.  Anterior incision from above the  patella down to the tibial tubercle.  Medial parapatellar arthrotomy.  Knee  exposed.  Grade 4  changes throughout.  Remnants of menisci, cruciate  ligaments, periarticular spurs, loose bodies, intra-articular adhesions,  synovitis all excised.  Distal femur exposed.  Intramedullary guide placed.  Distal cut removing 10 mm 5 degrees valgus.  Sized for #7 component, jigs  put in place. Definitive cuts made.  Attention turned to the tibia.  Tibial  spine removed with a saw.  Sized with #7 component.  Proximal cut 5 degree  posterior slope cut resecting 6 mm from the deficient medial side.  Patella  sized, reamed and drilled for 26 mm component.  All trials put in place and  found to fit well.  With a 10 mm insert, full extension, full flexion, good  stability.  Good patellofemoral tracking.  Tibia was marked for rotation and  then hand reamed.  All trials removed.  Copious irrigation with a pulse  irrigating device.  All components cemented under pressurized  technique.  Polyethylene attached to the tibia and knee reduced.  At completion, I had  good stability, good alignment, good patellofemoral tracking, full extension  and full flexion.  Wound irrigated.  Hemovac placed and brought out through  a separate stab wound.  Arthrotomy closed with #1 Vicryl.  Skin and  subcutaneous tissue with Vicryl and staples.  Sterile compressive dressing  applied.  Hemovacs clamped after knee injected with Marcaine.  Knee  immobilizer applied after tourniquet removed.  Anesthesia reversed.  Brought  to recovery room.  Tolerated surgery well.  No complications.                                                  Loreta Ave, M.D.    DFM/MEDQ  D:  06/08/2002  T:  06/08/2002  Job:  [id number]

## 2010-07-04 NOTE — Op Note (Signed)
Holly Flores, Holly Flores             ACCOUNT NO.:  [id number]   MEDICAL RECORD NO.:  [id number]          PATIENT TYPE:  AMB   LOCATION:  DAY                           FACILITY:  [facility]   PHYSICIAN:  R. Roetta Sessions, M.D. DATE OF BIRTH:  12/15/1945   DATE OF PROCEDURE:  02/27/2004  DATE OF DISCHARGE:                                 OPERATIVE REPORT   PROCEDURES:  Esophagogastroduodenoscopy with Elease Hashimoto dilation, followed by  colonoscopy with snare polypectomy.   INDICATION FOR PROCEDURE:  The patient is a 65 year old lady with  intractable gastroesophageal reflux disease symptoms, epigastric and  esophageal dysphagia, Hemoccult-positive stool, history of colonic polyps.  EGD and colonoscopy are now being done.  This approach was discussed with  the patient at length previously and again at the bedside.  The potential  risks, benefits, and alternatives have been reviewed, questions answered.  Please see the documentation in the medical record.   PROCEDURE NOTE:  O2 saturation, blood pressure, pulse, and respiration were  monitored throughout the entirety of both procedures.   CONSCIOUS SEDATION:  Versed 5 mg IV, Demerol 100 mg IV in divided doses.   PREMEDICATION:  The patient received vancomycin 1 g IV, gentamicin 120 mg IV  prior to the procedure.   INSTRUMENT USED:  Olympus video chip system.   FINDINGS:  EGD:  Examination of the tubular esophagus revealed a subtle  Schatzki's ring.  The esophageal mucosa otherwise appeared normal.  The EG  junction was easily traversed.   Stomach:  The gastric cavity was empty and insufflated well with air.  A  thorough examination of the gastric mucosa including a retroflexed view of  the proximal stomach and esophagogastric junction demonstrated only a tiny  pyloric channel erosion.  The bulb and second portion appeared normal.   Therapeutic/diagnostic maneuvers performed:  A 56 French Maloney dilator was  passed to full insertion with good  patient tolerance with blood return on  the dilator.  A look back revealed no apparent complication related to  passage of the dilator.  The patient tolerated the procedure well and was  prepared for colonoscopy.   Digital rectal examination revealed no abnormalities.   Endoscopic findings:  Prep was adequate.   Rectum:  Examination of the rectal mucosa including a retroflexed view of  the anal verge revealed only internal hemorrhoids.   Colon:  The colonic mucosa was surveyed from the rectosigmoid junction  through the left, transverse, and right colon to the area of the appendiceal  orifice, ileocecal valve, and cecum.  These structures were well-seen and  photographed for the record.  From this level the scope was slowly withdrawn  and all previously-mentioned mucosal surfaces were again seen.  The patient  was noted to have a 7 mm pedunculated polyp at 35 cm.  The remainder of the  colonic mucosa appeared normal.  This polyp was removed with snare cautery.  The patient tolerated the procedure well, was reacted in endoscopy.   IMPRESSION:  EGD:  Subtle Schatzki's ring, otherwise normal upper GI tract,  aside from a small pyloric channel erosion, status post dilation  as  described above.  Colonoscopy findings:  1.  Internal hemorrhoids, otherwise normal rectum.  2.  Pedunculated polyp at 35 cm, removed with snare.  The remainder of the      colonic mucosa appeared normal.   RECOMMENDATIONS:  1.  No aspirin or arthritis medications for 10 days.  2.  Follow up on pathology.  3.  Continue Nexium 40 mg orally daily.  4.  Further recommendations to follow.     Otelia Sergeant   RMR/MEDQ  D:  02/27/2004  T:  02/27/2004  Job:  [id number]   cc:   Rolin Barry, M.D.  [place], [place]

## 2011-03-16 ENCOUNTER — Other Ambulatory Visit (HOSPITAL_COMMUNITY): Payer: Self-pay | Admitting: *Deleted

## 2011-03-16 DIAGNOSIS — Z1231 Encounter for screening mammogram for malignant neoplasm of breast: Secondary | ICD-10-CM

## 2011-04-13 ENCOUNTER — Ambulatory Visit (HOSPITAL_COMMUNITY)
Admission: RE | Admit: 2011-04-13 | Discharge: 2011-04-13 | Disposition: A | Discharge status: Home / Self Care | Payer: Medicare Other | Source: Ambulatory Visit | Attending: *Deleted | Admitting: *Deleted

## 2011-04-13 DIAGNOSIS — Z1231 Encounter for screening mammogram for malignant neoplasm of breast: Secondary | ICD-10-CM

## 2011-08-12 ENCOUNTER — Encounter (HOSPITAL_COMMUNITY): Payer: Self-pay | Admitting: *Deleted

## 2011-08-12 ENCOUNTER — Emergency Department (HOSPITAL_COMMUNITY)
Admission: EM | Admit: 2011-08-12 | Discharge: 2011-08-13 | Disposition: A | Discharge status: Home / Self Care | Payer: Medicare Other | Attending: Emergency Medicine | Admitting: Emergency Medicine

## 2011-08-12 DIAGNOSIS — F17200 Nicotine dependence, unspecified, uncomplicated: Secondary | ICD-10-CM | POA: Insufficient documentation

## 2011-08-12 DIAGNOSIS — Z8673 Personal history of transient ischemic attack (TIA), and cerebral infarction without residual deficits: Secondary | ICD-10-CM | POA: Insufficient documentation

## 2011-08-12 DIAGNOSIS — H539 Unspecified visual disturbance: Secondary | ICD-10-CM

## 2011-08-12 DIAGNOSIS — I10 Essential (primary) hypertension: Secondary | ICD-10-CM | POA: Insufficient documentation

## 2011-08-12 DIAGNOSIS — I509 Heart failure, unspecified: Secondary | ICD-10-CM | POA: Insufficient documentation

## 2011-08-12 DIAGNOSIS — R51 Headache: Secondary | ICD-10-CM | POA: Insufficient documentation

## 2011-08-12 DIAGNOSIS — M129 Arthropathy, unspecified: Secondary | ICD-10-CM | POA: Insufficient documentation

## 2011-08-12 HISTORY — DX: Heart failure, unspecified: I50.9

## 2011-08-12 HISTORY — DX: Essential (primary) hypertension: I10

## 2011-08-12 HISTORY — DX: Unspecified osteoarthritis, unspecified site: M19.90

## 2011-08-12 HISTORY — DX: Cerebral infarction, unspecified: I63.9

## 2011-08-12 LAB — CBC WITH DIFFERENTIAL/PLATELET
Basophils Absolute: 0 10*3/uL (ref 0.0–0.1)
Eosinophils Absolute: 0.5 10*3/uL (ref 0.0–0.7)
Lymphs Abs: 4.2 10*3/uL — ABNORMAL HIGH (ref 0.7–4.0)
MCH: 30.6 pg (ref 26.0–34.0)
Neutrophils Relative %: 36 % — ABNORMAL LOW (ref 43–77)
Platelets: 244 10*3/uL (ref 150–400)
RBC: 4.38 MIL/uL (ref 3.87–5.11)
WBC: 8.7 10*3/uL (ref 4.0–10.5)

## 2011-08-12 LAB — BASIC METABOLIC PANEL
GFR calc Af Amer: 53 mL/min — ABNORMAL LOW (ref >90)
GFR calc non Af Amer: 46 mL/min — ABNORMAL LOW (ref >90)
Glucose, Bld: 122 mg/dL — ABNORMAL HIGH (ref 70–99)
Potassium: 3.5 mEq/L (ref 3.5–5.1)
Sodium: 139 mEq/L (ref 135–145)

## 2011-08-12 NOTE — ED Notes (Signed)
MD at bedside. 

## 2011-08-12 NOTE — ED Notes (Signed)
Visual changes lt eye and fell app 3pm today.  Vision of left eye greatly decreased.  Headache, Hx of stroke. Had MRI last week at [facility].

## 2011-08-12 NOTE — ED Provider Notes (Addendum)
History   This chart was scribed for Donnetta Hutching, MD by Melba Coon. The patient was seen in room APA14/APA14 and the patient's care was started at 9:16PM.    CSN: [id number]  Arrival date & time 08/12/11  2057   First MD Initiated Contact with Patient 08/12/11 2113      Chief Complaint  Patient presents with  . Headache    (Consider location/radiation/quality/duration/timing/severity/associated sxs/prior treatment) HPI Holly Flores is a 66 y.o. female who presents to the Emergency Department complaining of constant, moderate to severe headache with associated blurry vision of the left eye with an onset 3:00PM today. Around onset, LOC was present after pain started. Pt can see "figures" but can't see detailed images in left eye. Right eye vision is nml to baseline. Pt also has lateral left leg and arm numbness but not new today. Pt states that her optometrist states that she had a stroke in left eye last week; had blurry vision last week but not as bad as it is today; MRI showed no brain damage but not sure about eye damage. No fever, neck pain, sore throat, rash, back pain, CP, SOB, abd pain, n/v/d, dysuria, or extremity pain, edema, weakness, numbness, or tingling. Allergic to sulfa abx and penicillins. No other pertinent medical symptoms.  PCP: Dr. Charm Barges in [place] Optometrist: Dr. Larry Sierras in [place]   Past Medical History  Diagnosis Date  . CHF (congestive heart failure)   . Arthritis   . Stroke   . Hypertension     Past Surgical History  Procedure Date  . Back surgery   . Knee surgery   . Cholecystectomy   . Tonsillectomy   . Cesarean section   . Cervical spine surgery     History reviewed. No pertinent family history.  History  Substance Use Topics  . Smoking status: Current Some Day Smoker  . Smokeless tobacco: Not on file  . Alcohol Use: No    OB History    Grav Para Term Preterm Abortions TAB SAB Ect Mult Living                  Review of  Systems 10 Systems reviewed and all are negative for acute change except as noted in the HPI.   Allergies  Sulfa antibiotics and Penicillins  Home Medications  No current outpatient prescriptions on file.  BP 119/67  Pulse 72  Temp 97.4 F (36.3 C) (Oral)  Resp 20  Ht 5\' 1"  (1.549 m)  Wt 220 lb (99.791 kg)  BMI 41.57 kg/m2  SpO2 97%  Physical Exam  Nursing note and vitals reviewed. Constitutional: She is oriented to person, place, and time. She appears well-developed and well-nourished.  HENT:  Head: Normocephalic and atraumatic.  Eyes: Conjunctivae and EOM are normal. Pupils are equal, round, and reactive to light.       Subjectively patient complains of blurred vision on left eye  Neck: Normal range of motion. Neck supple.  Cardiovascular: Normal rate and regular rhythm.   Pulmonary/Chest: Effort normal and breath sounds normal.  Abdominal: Soft. Bowel sounds are normal.  Musculoskeletal: Normal range of motion.  Neurological: She is alert and oriented to person, place, and time.  Skin: Skin is warm and dry.  Psychiatric: She has a normal mood and affect.    ED Course  Procedures (including critical care time)  DIAGNOSTIC STUDIES: Oxygen Saturation is 97% on room air, normal by my interpretation.    COORDINATION OF CARE:  9:27PM -  EDMD will send pt to [facility] for [facility] head; blood w/u will be ordered for the pt.  Labs Reviewed - No data to display No results found.   No diagnosis found.   Date: 08/12/2011  Rate: 76as her  Rhythm: normal sinus rhythm  QRS Axis: normal  Intervals: normal  ST/T Wave abnormalities: normal  Conduction Disutrbances: none  Narrative Interpretation: unremarkable  Results for orders placed during the hospital encounter of 08/12/11  CBC WITH DIFFERENTIAL      Component Value Range   WBC 8.7  4.0 - 10.5 K/uL   RBC 4.38  3.87 - 5.11 MIL/uL   Hemoglobin 13.4  12.0 - 15.0 g/dL   HCT 16.1  09.6 - 04.5 %   MCV 93.2  78.0 - 100.0  fL   MCH 30.6  26.0 - 34.0 pg   MCHC 32.8  30.0 - 36.0 g/dL   RDW 40.9  81.1 - 91.4 %   Platelets 244  150 - 400 K/uL   Neutrophils Relative 36 (*) 43 - 77 %   Neutro Abs 3.2  1.7 - 7.7 K/uL   Lymphocytes Relative 48 (*) 12 - 46 %   Lymphs Abs 4.2 (*) 0.7 - 4.0 K/uL   Monocytes Relative 9  3 - 12 %   Monocytes Absolute 0.8  0.1 - 1.0 K/uL   Eosinophils Relative 6 (*) 0 - 5 %   Eosinophils Absolute 0.5  0.0 - 0.7 K/uL   Basophils Relative 1  0 - 1 %   Basophils Absolute 0.0  0.0 - 0.1 K/uL  BASIC METABOLIC PANEL      Component Value Range   Sodium 139  135 - 145 mEq/L   Potassium 3.5  3.5 - 5.1 mEq/L   Chloride 101  96 - 112 mEq/L   CO2 28  19 - 32 mEq/L   Glucose, Bld 122 (*) 70 - 99 mg/dL   BUN 12  6 - 23 mg/dL   Creatinine, Ser 7.82 (*) 0.50 - 1.10 mg/dL   Calcium 9.1  8.4 - 95.6 mg/dL   GFR calc non Af Amer 46 (*) >90 mL/min   GFR calc Af Amer 53 (*) >90 mL/min      MDM  MRI head from [facility] 07/20/2011 shows mild atrophy with moderate chronic microvascular ischemic changes. no acute or large vessel infarct is observed.   Ct head is pending from [facility].  Discussed with Dr Colon Branch I personally performed the services described in this documentation, which was scribed in my presence. The recorded information has been reviewed and considered.  Recheck ay 0020:  No deterioration in left visual field. Patient wants to go home. She can see her ophthalmologist tomorrow. She understands risks and benefits of this decision.      Donnetta Hutching, MD 08/12/11 2356  Donnetta Hutching, MD 08/13/11 2130  Donnetta Hutching, MD 09/23/11 1534

## 2011-08-12 NOTE — ED Notes (Signed)
[facility] called to transport pt, ETA approx 11pm

## 2011-08-13 NOTE — ED Notes (Signed)
Discharge instructions reviewed with pt, questions answered. Pt verbalized understanding.  

## 2011-08-13 NOTE — Discharge Instructions (Signed)
followup your ophthalmologist tomorrow.

## 2011-09-10 ENCOUNTER — Encounter (HOSPITAL_COMMUNITY): Payer: Self-pay | Admitting: Pharmacy Technician

## 2011-09-15 ENCOUNTER — Encounter (HOSPITAL_COMMUNITY)
Admission: RE | Admit: 2011-09-15 | Discharge: 2011-09-15 | Disposition: A | Discharge status: Home / Self Care | Payer: Medicare Other | Source: Ambulatory Visit | Attending: Ophthalmology | Admitting: Ophthalmology

## 2011-09-15 ENCOUNTER — Encounter (HOSPITAL_COMMUNITY): Payer: Self-pay

## 2011-09-15 HISTORY — DX: Shortness of breath: R06.02

## 2011-09-15 HISTORY — DX: Cyst of kidney, acquired: N28.1

## 2011-09-15 HISTORY — DX: Gastro-esophageal reflux disease without esophagitis: K21.9

## 2011-09-15 HISTORY — DX: Anxiety disorder, unspecified: F41.9

## 2011-09-15 HISTORY — DX: Nontoxic goiter, unspecified: E04.9

## 2011-09-15 HISTORY — DX: Sleep apnea, unspecified: G47.30

## 2011-09-15 HISTORY — DX: Hyperlipidemia, unspecified: E78.5

## 2011-09-15 HISTORY — DX: Chronic obstructive pulmonary disease, unspecified: J44.9

## 2011-09-15 HISTORY — DX: Depression, unspecified: F32.A

## 2011-09-15 HISTORY — DX: Major depressive disorder, single episode, unspecified: F32.9

## 2011-09-15 MED ORDER — CYCLOPENTOLATE-PHENYLEPHRINE 0.2-1 % OP SOLN
OPHTHALMIC | Status: AC
  Filled 2011-09-15: qty 2

## 2011-09-15 NOTE — Patient Instructions (Addendum)
Your procedure is scheduled on:  09/21/11  Report to [facility] at 06:15 AM.  Call this number if you have problems the morning of surgery: [phone]   Remember:   Do not eat or drink:After Midnight.  Take these medicines the morning of surgery with A SIP OF WATER: Cymbalta, Nexium, Synthroid, Reglan and Xanax if needed. Also, use your inhalers, Albuterol and Nasonex.   Do not wear jewelry, make-up or nail polish.  Do not wear lotions, powders, or perfumes. You may wear deodorant.  Do not bring valuables to the hospital.  Contacts, dentures or bridgework may not be worn into surgery.   Patients discharged the day of surgery will not be allowed to drive home.  Special Instructions: Take your eye drops as prescribed by your eye doctor.   Please read over the following fact sheets that you were given: Anesthesia Post-op Instructions and Care and Recovery After Surgery    Cataract Surgery  A cataract is a clouding of the lens of the eye. When a lens becomes cloudy, vision is reduced based on the degree and nature of the clouding. Surgery may be needed to improve vision. Surgery removes the cloudy lens and usually replaces it with a substitute lens (intraocular lens, IOL). LET YOUR EYE DOCTOR KNOW ABOUT:  Allergies to food or medicine.   Medicines taken including herbs, eyedrops, over-the-counter medicines, and creams.   Use of steroids (by mouth or creams).   Previous problems with anesthetics or numbing medicine.   History of bleeding problems or blood clots.   Previous surgery.   Other health problems, including diabetes and kidney problems.   Possibility of pregnancy, if this applies.  RISKS AND COMPLICATIONS  Infection.   Inflammation of the eyeball (endophthalmitis) that can spread to both eyes (sympathetic ophthalmia).   Poor wound healing.   If an IOL is inserted, it can later fall out of proper position. This is very uncommon.   Clouding of the part of your eye  that holds an IOL in place. This is called an "after-cataract." These are uncommon, but easily treated.  BEFORE THE PROCEDURE  Do not eat or drink anything except small amounts of water for 8 to 12 before your surgery, or as directed by your caregiver.   Unless you are told otherwise, continue any eyedrops you have been prescribed.   Talk to your primary caregiver about all other medicines that you take (both prescription and non-prescription). In some cases, you may need to stop or change medicines near the time of your surgery. This is most important if you are taking blood-thinning medicine.Do not stop medicines unless you are told to do so.   Arrange for someone to drive you to and from the procedure.   Do not put contact lenses in either eye on the day of your surgery.  PROCEDURE There is more than one method for safely removing a cataract. Your doctor can explain the differences and help determine which is best for you. Phacoemulsification surgery is the most common form of cataract surgery.  An injection is given behind the eye or eyedrops are given to make this a painless procedure.   A small cut (incision) is made on the edge of the clear, dome-shaped surface that covers the front of the eye (cornea).   A tiny probe is painlessly inserted into the eye. This device gives off ultrasound waves that soften and break up the cloudy center of the lens. This makes it easier for the cloudy  lens to be removed by suction.   An IOL may be implanted.   The normal lens of the eye is covered by a clear capsule. Part of that capsule is intentionally left in the eye to support the IOL.   Your surgeon may or may not use stitches to close the incision.  There are other forms of cataract surgery that require a larger incision and stiches to close the eye. This approach is taken in cases where the doctor feels that the cataract cannot be easily removed using phacoemulsification. AFTER THE  PROCEDURE  When an IOL is implanted, it does not need care. It becomes a permanent part of your eye and cannot be seen or felt.   Your doctor will schedule follow-up exams to check on your progress.   Review your other medicines with your doctor to see which can be resumed after surgery.   Use eyedrops or take medicine as prescribed by your doctor.  Document Released: 01/22/2011 Document Reviewed: 01/19/2011 [facility] Patient Information 2012 [place], [place].   PATIENT INSTRUCTIONS POST-ANESTHESIA  IMMEDIATELY FOLLOWING SURGERY:  Do not drive or operate machinery for the first twenty four hours after surgery.  Do not make any important decisions for twenty four hours after surgery or while taking narcotic pain medications or sedatives.  If you develop intractable nausea and vomiting or a severe headache please notify your doctor immediately.  FOLLOW-UP:  Please make an appointment with your surgeon as instructed. You do not need to follow up with anesthesia unless specifically instructed to do so.  WOUND CARE INSTRUCTIONS (if applicable):  Keep a dry clean dressing on the anesthesia/puncture wound site if there is drainage.  Once the wound has quit draining you may leave it open to air.  Generally you should leave the bandage intact for twenty four hours unless there is drainage.  If the epidural site drains for more than 36-48 hours please call the anesthesia department.  QUESTIONS?:  Please feel free to call your physician or the hospital operator if you have any questions, and they will be happy to assist you.

## 2011-09-21 ENCOUNTER — Encounter (HOSPITAL_COMMUNITY): Payer: Self-pay | Admitting: Anesthesiology

## 2011-09-21 ENCOUNTER — Ambulatory Visit (HOSPITAL_COMMUNITY)
Admission: RE | Admit: 2011-09-21 | Discharge: 2011-09-21 | Disposition: A | Discharge status: Home / Self Care | Payer: Medicare Other | Source: Ambulatory Visit | Attending: Ophthalmology | Admitting: Ophthalmology

## 2011-09-21 ENCOUNTER — Ambulatory Visit (HOSPITAL_COMMUNITY): Payer: Medicare Other | Admitting: Anesthesiology

## 2011-09-21 ENCOUNTER — Encounter (HOSPITAL_COMMUNITY)
Admission: RE | Disposition: A | Discharge status: Home / Self Care | Payer: Self-pay | Source: Ambulatory Visit | Attending: Ophthalmology

## 2011-09-21 ENCOUNTER — Encounter (HOSPITAL_COMMUNITY): Payer: Self-pay | Admitting: *Deleted

## 2011-09-21 DIAGNOSIS — Z79899 Other long term (current) drug therapy: Secondary | ICD-10-CM | POA: Insufficient documentation

## 2011-09-21 DIAGNOSIS — H2510 Age-related nuclear cataract, unspecified eye: Secondary | ICD-10-CM | POA: Insufficient documentation

## 2011-09-21 DIAGNOSIS — I10 Essential (primary) hypertension: Secondary | ICD-10-CM | POA: Insufficient documentation

## 2011-09-21 DIAGNOSIS — G4733 Obstructive sleep apnea (adult) (pediatric): Secondary | ICD-10-CM | POA: Insufficient documentation

## 2011-09-21 DIAGNOSIS — J449 Chronic obstructive pulmonary disease, unspecified: Secondary | ICD-10-CM | POA: Insufficient documentation

## 2011-09-21 DIAGNOSIS — J4489 Other specified chronic obstructive pulmonary disease: Secondary | ICD-10-CM | POA: Insufficient documentation

## 2011-09-21 HISTORY — PX: CATARACT EXTRACTION W/PHACO: SHX586

## 2011-09-21 SURGERY — PHACOEMULSIFICATION, CATARACT, WITH IOL INSERTION
Anesthesia: Monitor Anesthesia Care | Site: Eye | Laterality: Left | Wound class: Clean

## 2011-09-21 MED ORDER — TRYPAN BLUE 0.06 % OP SOLN
OPHTHALMIC | Status: AC
  Filled 2011-09-21: qty 0.5

## 2011-09-21 MED ORDER — GATIFLOXACIN 0.5 % OP SOLN OPTIME - NO CHARGE
1.0000 [drp] | Freq: Once | OPHTHALMIC | Status: AC
  Administered 2011-09-21: 1 [drp] via OPHTHALMIC
  Filled 2011-09-21: qty 2.5

## 2011-09-21 MED ORDER — CYCLOPENTOLATE-PHENYLEPHRINE 0.2-1 % OP SOLN
1.0000 [drp] | OPHTHALMIC | Status: AC
  Administered 2011-09-21: 1 [drp] via OPHTHALMIC

## 2011-09-21 MED ORDER — TETRACAINE 0.5 % OP SOLN OPTIME - NO CHARGE
OPHTHALMIC | Status: DC | PRN
  Administered 2011-09-21: 2 [drp] via OPHTHALMIC

## 2011-09-21 MED ORDER — KETOROLAC TROMETHAMINE 0.4 % OP SOLN - NO CHARGE
1.0000 [drp] | Freq: Once | OPHTHALMIC | Status: AC
  Administered 2011-09-21: 1 [drp] via OPHTHALMIC
  Filled 2011-09-21: qty 5

## 2011-09-21 MED ORDER — TETRACAINE HCL 0.5 % OP SOLN
OPHTHALMIC | Status: AC
  Filled 2011-09-21: qty 2

## 2011-09-21 MED ORDER — NA HYALUR & NA CHOND-NA HYALUR 0.55-0.5 ML IO KIT
PACK | INTRAOCULAR | Status: DC | PRN
  Administered 2011-09-21: 1 via OPHTHALMIC

## 2011-09-21 MED ORDER — LACTATED RINGERS IV SOLN
INTRAVENOUS | Status: DC
  Administered 2011-09-21: 1000 mL via INTRAVENOUS

## 2011-09-21 MED ORDER — TETRACAINE HCL 0.5 % OP SOLN
1.0000 [drp] | OPHTHALMIC | Status: AC
Start: 2011-09-21 — End: 2011-09-21
  Administered 2011-09-21: 1 [drp] via OPHTHALMIC

## 2011-09-21 MED ORDER — LIDOCAINE 3.5 % OP GEL OPTIME - NO CHARGE
OPHTHALMIC | Status: DC | PRN
  Administered 2011-09-21: 1 [drp] via OPHTHALMIC

## 2011-09-21 MED ORDER — LIDOCAINE HCL 3.5 % OP GEL: 1.0000 "application " | Freq: Once | OPHTHALMIC | Status: DC

## 2011-09-21 MED ORDER — EPINEPHRINE HCL 1 MG/ML IJ SOLN
INTRAOCULAR | Status: DC | PRN
  Administered 2011-09-21: 08:00:00

## 2011-09-21 MED ORDER — LIDOCAINE HCL 3.5 % OP GEL
OPHTHALMIC | Status: AC
  Filled 2011-09-21: qty 5

## 2011-09-21 MED ORDER — MIDAZOLAM HCL 2 MG/2ML IJ SOLN
1.0000 mg | INTRAMUSCULAR | Status: DC | PRN
  Administered 2011-09-21: 2 mg via INTRAVENOUS

## 2011-09-21 MED ORDER — BSS IO SOLN
INTRAOCULAR | Status: DC | PRN
  Administered 2011-09-21: 15 mL via INTRAOCULAR

## 2011-09-21 MED ORDER — GATIFLOXACIN 0.5 % OP SOLN OPTIME - NO CHARGE
OPHTHALMIC | Status: DC | PRN
  Administered 2011-09-21: 1 [drp] via OPHTHALMIC

## 2011-09-21 MED ORDER — MIDAZOLAM HCL 2 MG/2ML IJ SOLN
INTRAMUSCULAR | Status: AC
  Filled 2011-09-21: qty 2

## 2011-09-21 SURGICAL SUPPLY — 29 items
CAPSULAR TENSION RING-AMO (OPHTHALMIC RELATED) IMPLANT
CLOTH BEACON ORANGE TIMEOUT ST (SAFETY) ×2 IMPLANT
GLOVE BIO SURGEON STRL SZ7.5 (GLOVE) IMPLANT
GLOVE BIOGEL M 6.5 STRL (GLOVE) IMPLANT
GLOVE BIOGEL PI IND STRL 6.5 (GLOVE) IMPLANT
GLOVE BIOGEL PI IND STRL 7.0 (GLOVE) ×1 IMPLANT
GLOVE BIOGEL PI INDICATOR 6.5 (GLOVE)
GLOVE BIOGEL PI INDICATOR 7.0 (GLOVE) ×1
GLOVE ECLIPSE 6.5 STRL STRAW (GLOVE) IMPLANT
GLOVE ECLIPSE 7.5 STRL STRAW (GLOVE) IMPLANT
GLOVE EXAM NITRILE LRG STRL (GLOVE) IMPLANT
GLOVE EXAM NITRILE MD LF STRL (GLOVE) ×2 IMPLANT
GLOVE SKINSENSE NS SZ6.5 (GLOVE)
GLOVE SKINSENSE NS SZ7.0 (GLOVE)
GLOVE SKINSENSE STRL SZ6.5 (GLOVE) IMPLANT
GLOVE SKINSENSE STRL SZ7.0 (GLOVE) IMPLANT
GLOVE SS BIOGEL STRL SZ 6.5 (GLOVE) ×1 IMPLANT
GLOVE SUPERSENSE BIOGEL SZ 6.5 (GLOVE) ×1
INST SET CATARACT ~~LOC~~ (KITS) ×2 IMPLANT
KIT VITRECTOMY (OPHTHALMIC RELATED) IMPLANT
PAD ARMBOARD 7.5X6 YLW CONV (MISCELLANEOUS) ×2 IMPLANT
PROC W NO LENS (INTRAOCULAR LENS)
PROC W SPEC LENS (INTRAOCULAR LENS)
PROCESS W NO LENS (INTRAOCULAR LENS) IMPLANT
PROCESS W SPEC LENS (INTRAOCULAR LENS) IMPLANT
RING MALYGIN (MISCELLANEOUS) IMPLANT
SIGHTPATH CAT PROC W REG LENS (Ophthalmic Related) ×2 IMPLANT
VISCOELASTIC ADDITIONAL (OPHTHALMIC RELATED) IMPLANT
WATER STERILE IRR 250ML POUR (IV SOLUTION) ×2 IMPLANT

## 2011-09-21 NOTE — Progress Notes (Signed)
Medication order incorrect in EPIC.  MD order for Cyclomydril and Tetracaine is for only one drop to left eye per medication.  Unable to change order due to timing of medication.  One drop Cyclomydril and one drop Tetracaine administered to left eye per MD order.  See chart for more information.

## 2011-09-21 NOTE — Transfer of Care (Signed)
Immediate Anesthesia Transfer of Care Note  Patient: Holly Flores  Procedure(s) Performed: Procedure(s) (LRB): CATARACT EXTRACTION PHACO AND INTRAOCULAR LENS PLACEMENT (IOC) (Left)  Patient Location: PACU and Short Stay  Anesthesia Type: MAC  Level of Consciousness: awake, alert , oriented and patient cooperative  Airway & Oxygen Therapy: Patient Spontanous Breathing  Post-op Assessment: Report given to PACU RN, Post -op Vital signs reviewed and stable and Patient moving all extremities  Post vital signs: Reviewed and stable  Complications: No apparent anesthesia complications

## 2011-09-21 NOTE — H&P (Signed)
I have reviewed the pre printed H&P, the patient was re-examined, and I have identified no significant interval changes in the patient's medical condition.  There is no change in the plan of care since the history and physical of record. 

## 2011-09-21 NOTE — Brief Op Note (Signed)
09/21/2011  10:52 AM  PATIENT:  Holly Flores  66 y.o. female  PRE-OPERATIVE DIAGNOSIS:  nuclear cataract left eye  POST-OPERATIVE DIAGNOSIS:  nuclear cataract left eye  PROCEDURE:  Procedure(s): CATARACT EXTRACTION PHACO AND INTRAOCULAR LENS PLACEMENT (IOC)  SURGEON:  Surgeon(s): Susa Simmonds, MD  ASSISTANTS:  Laurena Bering, CST   ANESTHESIA STAFF: Despina Hidden, CRNA - CRNA Roselie Awkward, MD - Anesthesiologist  ANESTHESIA:   topical and MAC  REQUESTED LENS POWER: 22.0  LENS IMPLANT INFORMATION:  Alcon [id number] s/n [id number]  Exp 11/2015  CUMULATIVE DISSIPATED ENERGY:9.78  INDICATIONS:see cpre op H&P  OP FINDINGS:dense NS   COMPLICATIONS:None  DICTATION #: n/a  PLAN OF CARE: see H&P  PATIENT DISPOSITION:  Short Stay

## 2011-09-21 NOTE — Anesthesia Preprocedure Evaluation (Signed)
Anesthesia Evaluation  Patient identified by MRN, date of birth, ID band Patient awake    Reviewed: Allergy & Precautions  Airway Mallampati: I TM Distance: >3 FB Neck ROM: Full    Dental  (+) Edentulous Upper and Edentulous Lower   Pulmonary shortness of breath, sleep apnea , COPD   Pulmonary exam normal       Cardiovascular hypertension, Pt. on medications +CHF Rhythm:Regular Rate:Normal     Neuro/Psych PSYCHIATRIC DISORDERS Anxiety Depression CVA    GI/Hepatic Neg liver ROS, GERD-  Medicated,  Endo/Other    Renal/GU   negative genitourinary   Musculoskeletal   Abdominal (+) + obese,  Abdomen: soft.    Peds  Hematology   Anesthesia Other Findings   Reproductive/Obstetrics                           Anesthesia Physical Anesthesia Plan  ASA: III  Anesthesia Plan: MAC   Post-op Pain Management:    Induction: Intravenous  Airway Management Planned: Nasal Cannula  Additional Equipment:   Intra-op Plan:   Post-operative Plan:   Informed Consent: I have reviewed the patients History and Physical, chart, labs and discussed the procedure including the risks, benefits and alternatives for the proposed anesthesia with the patient or authorized representative who has indicated his/her understanding and acceptance.     Plan Discussed with: CRNA  Anesthesia Plan Comments:         Anesthesia Quick Evaluation

## 2011-09-21 NOTE — Op Note (Signed)
See scanned op note dated today  

## 2011-09-21 NOTE — Anesthesia Postprocedure Evaluation (Signed)
  Anesthesia Post-op Note  Patient: Holly Flores  Procedure(s) Performed: Procedure(s) (LRB): CATARACT EXTRACTION PHACO AND INTRAOCULAR LENS PLACEMENT (IOC) (Left)  Patient Location: PACU and Short Stay  Anesthesia Type: MAC  Level of Consciousness: awake, alert , oriented and patient cooperative  Airway and Oxygen Therapy: Patient Spontanous Breathing  Post-op Pain: none  Post-op Assessment: Post-op Vital signs reviewed, Patient's Cardiovascular Status Stable, Respiratory Function Stable, Patent Airway, No signs of Nausea or vomiting and Pain level controlled  Post-op Vital Signs: Reviewed and stable  Complications: No apparent anesthesia complications

## 2011-09-22 ENCOUNTER — Encounter (HOSPITAL_COMMUNITY): Payer: Self-pay | Admitting: Ophthalmology

## 2013-04-20 ENCOUNTER — Encounter: Payer: Self-pay | Admitting: Cardiology

## 2013-04-20 ENCOUNTER — Ambulatory Visit (INDEPENDENT_AMBULATORY_CARE_PROVIDER_SITE_OTHER): Payer: Medicare Other | Admitting: Cardiology

## 2013-04-20 ENCOUNTER — Ambulatory Visit: Payer: Medicare Other | Admitting: Cardiology

## 2013-04-20 VITALS — BP 110/78 | HR 81 | Ht 61.0 in | Wt 232.0 lb

## 2013-04-20 DIAGNOSIS — R0989 Other specified symptoms and signs involving the circulatory and respiratory systems: Secondary | ICD-10-CM

## 2013-04-20 DIAGNOSIS — E785 Hyperlipidemia, unspecified: Secondary | ICD-10-CM | POA: Insufficient documentation

## 2013-04-20 DIAGNOSIS — R079 Chest pain, unspecified: Secondary | ICD-10-CM

## 2013-04-20 DIAGNOSIS — R0600 Dyspnea, unspecified: Secondary | ICD-10-CM

## 2013-04-20 DIAGNOSIS — I10 Essential (primary) hypertension: Secondary | ICD-10-CM

## 2013-04-20 DIAGNOSIS — R0609 Other forms of dyspnea: Secondary | ICD-10-CM

## 2013-04-20 NOTE — Progress Notes (Signed)
Clinical Summary Holly Flores is a 68 y.o.female seen today as a new patient. She was formerly followed by Dr Gwenlyn Found over 10 years ago at [facility].   1. 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 Repeat cath 10/2001 in setting of chest pain with LM normal, LAD 35-32% mid systolic bridge, LCX patent, RCA patent. LVEF 60% by LVgram.  - denies any recent chest pain.   2. Preoperative evaluation - planned for knee replacement - denies any recent chest pain. Chronic DOE with walking just a few feet. Physical activity mainly limited by chronic kneed pain.      Past Medical History  Diagnosis Date  . CHF (congestive heart failure)   . Arthritis   . Stroke   . Hypertension   . GERD (gastroesophageal reflux disease)   . COPD (chronic obstructive pulmonary disease)   . Shortness of breath   . Anxiety   . Depression   . Hyperlipidemia   . Goiter   . Kidney cysts     left side  . Sleep apnea      Allergies  Allergen Reactions  . Sulfa Antibiotics Swelling    Tongue swelling  . Penicillins Hives, Itching and Rash     Current Outpatient Prescriptions  Medication Sig Dispense Refill  . albuterol (PROVENTIL HFA;VENTOLIN HFA) 108 (90 BASE) MCG/ACT inhaler Inhale 2 puffs into the lungs every 6 (six) hours as needed. For shortness of breath      . albuterol (PROVENTIL) (2.5 MG/3ML) 0.083% nebulizer solution Take 2.5 mg by nebulization 3 (three) times daily.      Marland Kitchen alprazolam (XANAX) 2 MG tablet Take 2 mg by mouth 3 (three) times daily.       . Carboxymethylcellulose Sodium (THERATEARS) 0.25 % SOLN Apply 1 drop to eye daily.      . DULoxetine (CYMBALTA) 20 MG capsule Take 20 mg by mouth daily.      Marland Kitchen esomeprazole (NEXIUM) 40 MG capsule Take 40 mg by mouth daily as needed. For heartburn      . levothyroxine (SYNTHROID, LEVOTHROID) 100 MCG tablet Take 100 mcg by mouth daily.      . metoCLOPramide (REGLAN) 5 MG tablet Take 5 mg by mouth every morning.      .  mometasone (NASONEX) 50 MCG/ACT nasal spray Place 2 sprays into the nose as needed. For allergies      . Multiple Vitamin (MULTIVITAMIN WITH MINERALS) TABS Take 1 tablet by mouth daily.      . naproxen sodium (ANAPROX) 220 MG tablet Take 220 mg by mouth every 8 (eight) hours as needed. For pain      . oxyCODONE-acetaminophen (PERCOCET) 10-325 MG per tablet Take 1 tablet by mouth every 6 (six) hours as needed. For pain      . potassium chloride SA (K-DUR,KLOR-CON) 20 MEQ tablet Take 20 mEq by mouth every morning.      . simvastatin (ZOCOR) 10 MG tablet Take 10 mg by mouth at bedtime.      . torsemide (DEMADEX) 20 MG tablet Take 40 mg by mouth every morning.      . Vitamin D, Ergocalciferol, (DRISDOL) 50000 UNITS CAPS Take 50,000 Units by mouth every 7 (seven) days. On Wednesdays       No current facility-administered medications for this visit.     Past Surgical History  Procedure Laterality Date  . Back surgery    . Knee surgery    . Cholecystectomy    .  Tonsillectomy    . Cesarean section    . Cervical spine surgery    . Cardiac catheterization    . Abdominal hysterectomy    . Breast biopsy      left-non cancerous  . Cataract extraction w/phaco  09/21/2011    Procedure: CATARACT EXTRACTION PHACO AND INTRAOCULAR LENS PLACEMENT (IOC);  Surgeon: Williams Che, MD;  Location: [facility];  Service: Ophthalmology;  Laterality: Left;  CDE:9.78     Allergies  Allergen Reactions  . Sulfa Antibiotics Swelling    Tongue swelling  . Penicillins Hives, Itching and Rash      No family history on file.   Social History Holly Flores reports that she has never smoked. She does not have any smokeless tobacco history on file. Holly Flores reports that she does not drink alcohol.   Review of Systems CONSTITUTIONAL: No weight loss, fever, chills, weakness or fatigue.  HEENT: Eyes: No visual loss, blurred vision, double vision or yellow sclerae.No hearing loss, sneezing, congestion, runny  nose or sore throat.  SKIN: No rash or itching.  CARDIOVASCULAR: per HPI  RESPIRATORY: No shortness of breath, cough or sputum.  GASTROINTESTINAL: No anorexia, nausea, vomiting or diarrhea. No abdominal pain or blood.  GENITOURINARY: No burning on urination, no polyuria NEUROLOGICAL: No headache, dizziness, syncope, paralysis, ataxia, numbness or tingling in the extremities. No change in bowel or bladder control.  MUSCULOSKELETAL:knee pain LYMPHATICS: No enlarged nodes. No history of splenectomy.  PSYCHIATRIC: No history of depression or anxiety.  ENDOCRINOLOGIC: No reports of sweating, cold or heat intolerance. No polyuria or polydipsia.  Marland Kitchen   Physical Examination p 81 bp 110/78 wt 232 lbs BMI 44 Gen: resting comfortably, no acute distress HEENT: no scleral icterus, pupils equal round and reactive, no palptable cervical adenopathy,  CV: RRR, no m/r/g, no JVD, no carotid bruits Resp: Clear to auscultation bilaterally GI: abdomen is soft, non-tender, non-distended, normal bowel sounds, no hepatosplenomegaly MSK: extremities are warm, no edema.  Skin: warm, no rash Neuro:  no focal deficits Psych: appropriate affect   Diagnostic Studies 10/2011 cath HEMODYNAMICS: Aortic systolic pressure: 427.  Aortic diastolic pressure: 78.  Left ventricular systolic pressure: 062.  Left ventricular diastolic pressure: 18.  CORONARY ANGIOGRAPHY:  1. Left main: The left main normal.  2. LAD: The LAD had a 37%-62% mid systolic bridge after a diagonal Kendrick Haapala.  3. Left circumflex coronary artery: Free of significant disease.  4. Right coronary artery: This a large dominant vessel which was free of  significant disease.  LEFT VENTRICULOGRAPHY: The RAO left ventriculogram was performed using 25  cc of Omnipaque dye at 12 cc per second. The overall LVEF was estimated at  greater than 60% without focal wall motion abnormality.  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 I suspect that her chest  pain is related to cholelithiasis.  The sheath was removed and pressure held on the groin to achieve hemostasis.  The patient left the cath lab in stable condition.  DISPOSITION: She will be discharged home today to be followed as an  outpatient. She will see me back in the office in several weeks. She is  cleared to undergo an elective cholecystectomy, at a low cardiovascular  risk. She left the laboratory in stable condition. Dr. Octavio Graves was  notified of these results.     Assessment and Plan  1. Chest pain - no current symptoms - prior myocardial bridging on cath, if recurrence of symptoms start beta blocker.   2. Dyspnea - describes DOE with just a few feet, unclear etiology. She does reprort prior pulmonary history, but unclear if could potentially be a cardiac component. She is on torsemide regularly at home, do not see any recent echoes in our system - obtain echo  3. Preoperative evalatuion - she is being considered for intermediate risk orthopedic surgery. She has no active acute cardiac issues. I cannot assess her cardiac functional capacity by history because she is limited by knee pain. She descirbes dyspnea with walking just a few feet which could potentially have a cardiac component given her age and cardiac risk factors - will obtain lexiscan to evalute for possible ischemia in setting of dyspnea and better risk stratify for surgery.    Follow 4 months   Arnoldo Lenis, M.D., F.A.C.C.

## 2013-04-20 NOTE — Patient Instructions (Signed)
Your physician recommends that you schedule a follow-up appointment in: 4 months with Dr. Harl Bowie. You should receive a letter in the mail in 2-3 months. If you do not receive this letter by May or June 2015 call our office to schedule this appointment.   Your physician recommends that you continue on your current medications as directed. Please refer to the Current Medication list given to you today.  Your physician has requested that you have an echocardiogram. Echocardiography is a painless test that uses sound waves to create images of your heart. It provides your doctor with information about the size and shape of your heart and how well your heart's chambers and valves are working. This procedure takes approximately one hour. There are no restrictions for this procedure.  Your physician has requested that you have a lexiscan myoview. For further information please visit [email or web address]. Please follow instruction sheet, as given.

## 2013-04-27 ENCOUNTER — Other Ambulatory Visit: Payer: Self-pay

## 2013-04-27 ENCOUNTER — Other Ambulatory Visit (INDEPENDENT_AMBULATORY_CARE_PROVIDER_SITE_OTHER): Payer: Medicare Other

## 2013-04-27 DIAGNOSIS — R0989 Other specified symptoms and signs involving the circulatory and respiratory systems: Secondary | ICD-10-CM

## 2013-04-27 DIAGNOSIS — R0600 Dyspnea, unspecified: Secondary | ICD-10-CM

## 2013-04-27 DIAGNOSIS — R0609 Other forms of dyspnea: Secondary | ICD-10-CM

## 2013-04-27 DIAGNOSIS — R079 Chest pain, unspecified: Secondary | ICD-10-CM

## 2013-04-28 ENCOUNTER — Other Ambulatory Visit (HOSPITAL_COMMUNITY)

## 2013-05-01 ENCOUNTER — Encounter (HOSPITAL_COMMUNITY): Payer: Self-pay

## 2013-05-01 ENCOUNTER — Encounter (HOSPITAL_COMMUNITY)
Admission: RE | Admit: 2013-05-01 | Discharge: 2013-05-01 | Disposition: A | Discharge status: Home / Self Care | Payer: Medicare Other | Source: Ambulatory Visit | Attending: Cardiology | Admitting: Cardiology

## 2013-05-01 DIAGNOSIS — R0600 Dyspnea, unspecified: Secondary | ICD-10-CM

## 2013-05-01 DIAGNOSIS — R0609 Other forms of dyspnea: Secondary | ICD-10-CM | POA: Insufficient documentation

## 2013-05-01 DIAGNOSIS — R0989 Other specified symptoms and signs involving the circulatory and respiratory systems: Principal | ICD-10-CM | POA: Insufficient documentation

## 2013-05-01 DIAGNOSIS — R079 Chest pain, unspecified: Secondary | ICD-10-CM

## 2013-05-01 MED ORDER — TECHNETIUM TC 99M SESTAMIBI - CARDIOLITE
30.0000 | Freq: Once | INTRAVENOUS | Status: AC | PRN
  Administered 2013-05-01: 12:00:00 30 via INTRAVENOUS

## 2013-05-01 MED ORDER — REGADENOSON 0.4 MG/5ML IV SOLN
INTRAVENOUS | Status: AC
  Administered 2013-05-01: 0.4 mg via INTRAVENOUS
  Filled 2013-05-01: qty 5

## 2013-05-01 MED ORDER — SODIUM CHLORIDE 0.9 % IJ SOLN
INTRAMUSCULAR | Status: AC
  Administered 2013-05-01: 10 mL via INTRAVENOUS
  Filled 2013-05-01: qty 10

## 2013-05-01 MED ORDER — TECHNETIUM TC 99M SESTAMIBI GENERIC - CARDIOLITE
10.0000 | Freq: Once | INTRAVENOUS | Status: AC | PRN
  Administered 2013-05-01: 10 via INTRAVENOUS

## 2013-05-01 NOTE — Progress Notes (Signed)
Stress Lab Nurses Notes - [facility]  JOSILYN SHIPPEE 05/01/2013 Reason for doing test: Chest Pain, Dyspnea and Surgical Clearance Type of test: Wille Glaser Nurse performing test: Gerrit Halls, RN Nuclear Medicine Tech: Redmond Baseman Echo Tech: Not Applicable MD performing test: S. McDowellK.Purcell Nails NP Family MD: Octavio Graves, DO Test explained and consent signed: yes IV started: 22g jelco, Saline lock flushed, No redness or edema and Saline lock started in radiology Symptoms: Flushed Treatment/Intervention: None Reason test stopped: protocol completed After recovery IV was: Discontinued via X-ray tech and No redness or edema Patient to return to [facility]. Med at : 12:30 Patient discharged: Home Patient's Condition upon discharge was: stable Comments: During test BP 141/84 & HR 93.  Recovery BP 129/78 & HR 68.  Symptoms resolved in recovery. Geanie Cooley T

## 2013-05-02 ENCOUNTER — Telehealth: Payer: Self-pay | Admitting: Cardiology

## 2013-05-02 NOTE — Telephone Encounter (Signed)
Message copied by Lewayne Bunting on Tue May 02, 2013  4:06 PM ------      Message from: [place] F      Created: Mon May 01, 2013 10:18 PM       Patient with normal echo and stress test, please update her and provide what ever her surgeon needs for clearance.            Carlyle Dolly MD ------

## 2013-05-02 NOTE — Telephone Encounter (Signed)
Left message for pt to return call in [place] office tomorrow Wednesday 05-03-13

## 2013-05-03 NOTE — Telephone Encounter (Signed)
Informed pt of results and pt verbalized understanding. I will forwarded information to Dr. Kathryne Hitch at [facility] in [place].

## 2013-05-08 ENCOUNTER — Other Ambulatory Visit: Payer: Self-pay | Admitting: Physician Assistant

## 2013-05-08 NOTE — H&P (Signed)
TOTAL KNEE ADMISSION H&P  Patient is being admitted for right total knee arthroplasty.  Subjective:  Chief Complaint:right knee pain.  HPI: Holly Flores, 68 y.o. female, has a history of pain and functional disability in the right knee due to arthritis and has failed non-surgical conservative treatments for greater than 12 weeks to includeNSAID's and/or analgesics, corticosteriod injections and activity modification.  Onset of symptoms was gradual, starting 6 years ago with gradually worsening course since that time. The patient noted no past surgery on the right knee(s).  Patient currently rates pain in the right knee(s) at 6 out of 10 with activity. Patient has night pain, worsening of pain with activity and weight bearing, pain that interferes with activities of daily living, pain with passive range of motion, crepitus and joint swelling.  Patient has evidence of subchondral sclerosis, periarticular osteophytes and joint space narrowing by imaging studies. There is no active infection.  Patient Active Problem List   Diagnosis Date Noted  . Chest pain 04/20/2013  . Dyspnea 04/20/2013  . Hyperlipidemia 04/20/2013   Past Medical History  Diagnosis Date  . CHF (congestive heart failure)   . Arthritis   . Stroke   . Hypertension   . GERD (gastroesophageal reflux disease)   . COPD (chronic obstructive pulmonary disease)   . Shortness of breath   . Anxiety   . Depression   . Hyperlipidemia   . Goiter   . Kidney cysts     left side  . Sleep apnea     Past Surgical History  Procedure Laterality Date  . Back surgery    . Knee surgery    . Cholecystectomy    . Tonsillectomy    . Cesarean section    . Cervical spine surgery    . Cardiac catheterization    . Abdominal hysterectomy    . Breast biopsy      left-non cancerous  . Cataract extraction w/phaco  09/21/2011    Procedure: CATARACT EXTRACTION PHACO AND INTRAOCULAR LENS PLACEMENT (IOC);  Surgeon: Williams Che, MD;   Location: [facility];  Service: Ophthalmology;  Laterality: Left;  CDE:9.78     (Not in a hospital admission) Allergies  Allergen Reactions  . Sulfa Antibiotics Swelling    Tongue swelling  . Penicillins Hives, Itching and Rash    History  Substance Use Topics  . Smoking status: Former Smoker -- 5 years    Types: Cigarettes    Quit date: 04/20/2008  . Smokeless tobacco: Never Used  . Alcohol Use: No    No family history on file.   Review of Systems  Constitutional: Negative.   HENT: Positive for nosebleeds.   Eyes: Negative.   Respiratory: Negative.   Cardiovascular: Negative.   Gastrointestinal: Positive for heartburn. Negative for nausea and vomiting.  Genitourinary: Negative.   Musculoskeletal: Positive for joint pain.  Skin: Negative.   Neurological: Positive for headaches.  Endo/Heme/Allergies: Bruises/bleeds easily.  Psychiatric/Behavioral: Negative.     Objective:  Physical Exam  Constitutional: She is oriented to person, place, and time. She appears well-developed and well-nourished.  HENT:  Head: Normocephalic and atraumatic.  Eyes: EOM are normal. Pupils are equal, round, and reactive to light.  Neck: Normal range of motion. Thyromegaly present.  Cardiovascular: Normal rate and regular rhythm.   Respiratory: Effort normal and breath sounds normal. No respiratory distress. She has no wheezes. She has no rales.  GI: Soft. Bowel sounds are normal. She exhibits no distension. There is no tenderness.  Musculoskeletal:  ROM 5-90, 5 degrees of varus.  Marked tibial femoral and patellofemoral crepitus.   Negative log roll.  Negative straight leg raise.  Neurovascularly intact distally.   Neurological: She is alert and oriented to person, place, and time.  Skin: Skin is warm and dry.  Psychiatric: She has a normal mood and affect. Her behavior is normal. Judgment and thought content normal.    Vital signs in last 24 hours: @VSRANGES @  Labs:   Estimated body  mass index is 43.86 kg/(m^2) as calculated from the following:   Height as of 04/20/13: 5\' 1"  (1.549 m).   Weight as of 04/20/13: 105.235 kg (232 lb).   Imaging Review Plain radiographs demonstrate severe degenerative joint disease of the right knee(s). The overall alignment ismild varus. The bone quality appears to be fair for age and reported activity level.  Assessment/Plan:  End stage arthritis, right knee   The patient history, physical examination, clinical judgment of the provider and imaging studies are consistent with end stage degenerative joint disease of the right knee(s) and total knee arthroplasty is deemed medically necessary. The treatment options including medical management, injection therapy arthroscopy and arthroplasty were discussed at length. The risks and benefits of total knee arthroplasty were presented and reviewed. The risks due to aseptic loosening, infection, stiffness, patella tracking problems, thromboembolic complications and other imponderables were discussed. The patient acknowledged the explanation, agreed to proceed with the plan and consent was signed. Patient is being admitted for inpatient treatment for surgery, pain control, PT, OT, prophylactic antibiotics, VTE prophylaxis, progressive ambulation and ADL's and discharge planning. The patient is planning to be discharged home with home health services

## 2013-05-10 ENCOUNTER — Encounter (HOSPITAL_COMMUNITY): Payer: Self-pay | Admitting: Pharmacy Technician

## 2013-05-11 NOTE — Pre-Procedure Instructions (Signed)
Holly Flores  05/11/2013   Your procedure is scheduled on: Wednesday, April 8th    Report to [facility]  2 * 3 at  12:30  PM.   Call this number if you have problems the morning of surgery: [phone]   Remember:   Do not eat food or drink liquids after midnight Tuesday.   Take these medicines the morning of surgery with A SIP OF WATER: Xanax, Nexium, Levothyroid, Pain medication.               Please take your inhalers & nebulizer.     Do not wear jewelry, make-up or nail polish.  Do not wear lotions, powders, or perfumes. You may NOT  wear deodorant.  Do not shave 48 hours prior to surgery.    Do not bring valuables to the hospital.  [facility] is not responsible for any belongings or valuables.               Contacts, dentures or bridgework may not be worn into surgery.  Leave suitcase in the car. After surgery it may be brought to your room.  For patients admitted to the hospital, discharge time is determined by your treatment team.    Name and phone number of your driver:    Special Instructions:   "Preparing for Surgery" instruction sheet.   Please read over the following fact sheets that you were given: Pain Booklet, Coughing and Deep Breathing, Blood Transfusion Information, MRSA Information and Surgical Site Infection Prevention

## 2013-05-12 ENCOUNTER — Encounter (HOSPITAL_COMMUNITY): Payer: Self-pay

## 2013-05-12 ENCOUNTER — Ambulatory Visit (HOSPITAL_COMMUNITY): Admission: RE | Admit: 2013-05-12 | Payer: Medicare Other | Source: Ambulatory Visit

## 2013-05-12 ENCOUNTER — Encounter (HOSPITAL_COMMUNITY)
Admission: RE | Admit: 2013-05-12 | Discharge: 2013-05-12 | Disposition: A | Discharge status: Home / Self Care | Payer: Medicare Other | Source: Ambulatory Visit | Attending: Orthopedic Surgery | Admitting: Orthopedic Surgery

## 2013-05-12 DIAGNOSIS — Z01812 Encounter for preprocedural laboratory examination: Secondary | ICD-10-CM | POA: Insufficient documentation

## 2013-05-12 LAB — CBC WITH DIFFERENTIAL/PLATELET
BASOS ABS: 0 10*3/uL (ref 0.0–0.1)
Basophils Relative: 0 % (ref 0–1)
EOS ABS: 0.9 10*3/uL — AB (ref 0.0–0.7)
EOS PCT: 9 % — AB (ref 0–5)
HEMATOCRIT: 41.6 % (ref 36.0–46.0)
Hemoglobin: 14.2 g/dL (ref 12.0–15.0)
Lymphocytes Relative: 46 % (ref 12–46)
Lymphs Abs: 4.4 10*3/uL — ABNORMAL HIGH (ref 0.7–4.0)
MCH: 30.9 pg (ref 26.0–34.0)
MCHC: 34.1 g/dL (ref 30.0–36.0)
MCV: 90.4 fL (ref 78.0–100.0)
MONO ABS: 0.6 10*3/uL (ref 0.1–1.0)
Monocytes Relative: 6 % (ref 3–12)
Neutro Abs: 3.7 10*3/uL (ref 1.7–7.7)
Neutrophils Relative %: 39 % — ABNORMAL LOW (ref 43–77)
Platelets: 243 10*3/uL (ref 150–400)
RBC: 4.6 MIL/uL (ref 3.87–5.11)
RDW: 14.2 % (ref 11.5–15.5)
WBC: 9.6 10*3/uL (ref 4.0–10.5)

## 2013-05-12 LAB — COMPREHENSIVE METABOLIC PANEL
ALT: 6 U/L (ref 0–35)
AST: 11 U/L (ref 0–37)
Albumin: 3.5 g/dL (ref 3.5–5.2)
Alkaline Phosphatase: 89 U/L (ref 39–117)
BUN: 13 mg/dL (ref 6–23)
CO2: 32 meq/L (ref 19–32)
CREATININE: 1.09 mg/dL (ref 0.50–1.10)
Calcium: 9.1 mg/dL (ref 8.4–10.5)
Chloride: 102 mEq/L (ref 96–112)
GFR calc non Af Amer: 51 mL/min — ABNORMAL LOW (ref >90)
GFR, EST AFRICAN AMERICAN: 59 mL/min — AB (ref >90)
GLUCOSE: 104 mg/dL — AB (ref 70–99)
Potassium: 3.6 mEq/L — ABNORMAL LOW (ref 3.7–5.3)
Sodium: 144 mEq/L (ref 137–147)
TOTAL PROTEIN: 7.3 g/dL (ref 6.0–8.3)
Total Bilirubin: 0.2 mg/dL — ABNORMAL LOW (ref 0.3–1.2)

## 2013-05-12 LAB — URINALYSIS, ROUTINE W REFLEX MICROSCOPIC
Bilirubin Urine: NEGATIVE
Glucose, UA: NEGATIVE mg/dL
Hgb urine dipstick: NEGATIVE
Ketones, ur: NEGATIVE mg/dL
Leukocytes, UA: NEGATIVE
Nitrite: NEGATIVE
PROTEIN: NEGATIVE mg/dL
Specific Gravity, Urine: 1.018 (ref 1.005–1.030)
UROBILINOGEN UA: 0.2 mg/dL (ref 0.0–1.0)
pH: 5 (ref 5.0–8.0)

## 2013-05-12 LAB — TYPE AND SCREEN
ABO/RH(D): B POS
ANTIBODY SCREEN: NEGATIVE

## 2013-05-12 LAB — SURGICAL PCR SCREEN
MRSA, PCR: NEGATIVE
STAPHYLOCOCCUS AUREUS: NEGATIVE

## 2013-05-12 LAB — PROTIME-INR
INR: 0.99 (ref 0.00–1.49)
Prothrombin Time: 12.9 seconds (ref 11.6–15.2)

## 2013-05-12 LAB — APTT: aPTT: 31 seconds (ref 24–37)

## 2013-05-12 NOTE — Progress Notes (Addendum)
Patient states she saw Dr. Carlyle Dolly [phone]) last week.  She had stress, echo and EKG d/t her past history of CHF.  Will request those records.  DA Mrs Partin believes she had CXR while at Dr. Nelly Laurence office.  May have to do one DOS.  DA Denies CP or SOB.  Feels pretty good except for the knee.  DA  I have placed a call into Dr. Percell Miller concerning the thigh hi teds.  Her calf diameter measure 18.5.  We do NOT have anything larger then 17.5 (spoke with materials).  I believe these will be to tight.  DA  Spoke with the PA.Marland KitchenMarland KitchenMarland KitchenShe stated that it was ok to use the  17.5 diameter ted hose.   DA

## 2013-05-14 LAB — URINE CULTURE: Colony Count: 75000

## 2013-05-15 ENCOUNTER — Encounter (HOSPITAL_COMMUNITY): Payer: Self-pay

## 2013-05-15 NOTE — Progress Notes (Signed)
Anesthesia Chart Review:  Patient is a 68 year old female scheduled for right TKA on 05/24/13 by Dr. Kathryne Hitch.  History includes former smoker, CHF, hypertension, hyperlipidemia, obstructive sleep apnea, GERD, COPD, left ocular stroke, goiter, anxiety, depression, goiter, left renal cysts, L4-5 PLIF, left TKA '04. BMI is 43 consistent with morbid obesity. Cardiologist Dr. Carlyle Dolly cleared her for surgery following normal stress and echo. PCP is listed as Dr. Octavio Graves.  Nuclear stress test on 05/01/13 showed: Low risk Lexiscan Cardiolite. There were no diagnostic ST segment abnormalities. Perfusion imaging is most consistent with breast attenuation, no clear evidence of scar or ischemia as outlined above. LVEF is normal at 67% with normal volumes and normal wall motion.  Echo on 04/27/13 showed: Mild LVH with LVEF 32-67%, grade 1 diastolic dysfunction. Mild left atrial enlargement. Minor MAC with trivial mitral regurgitation. Trivial tricuspid regurgitation. Unable to assess PASP.  Cardiac cath on 10/18/01 showed: 1. Left main: The left main normal.  2. LAD: The LAD had a 12%-45% mid systolic bridge after a diagonal branch.  3. Left circumflex coronary artery: Free of significant disease.  4. Right coronary artery: This a large dominant vessel which was free of significant disease.  EKG on 04/20/13 showed NSR, LAD, poor r wave progression, non-specific T wave abnormality.  No CXR done at [facility], so it will need to be done on the day of surgery.  Preoperative labs noted. UA WNL. Urine culture showed: 75,000 colonies DIPHTHEROIDS (CORYNEBACTERIUM SPECIES) Note: Standardized susceptibility testing for this organism is not available. Doran Stabler, PA-C aware.  She will follow-up with patient and determine if she will need any antibiotic treatment pre-operatively.  From an anesthesia standpoint, I think she can proceed as planned if no acute changes.   George [facility]  Short Stay Center/Anesthesiology Phone [phone] 05/15/2013 3:23 PM

## 2013-05-23 MED ORDER — CLINDAMYCIN PHOSPHATE 900 MG/50ML IV SOLN
900.0000 mg | INTRAVENOUS | Status: AC
  Administered 2013-05-24: 900 mg via INTRAVENOUS
  Filled 2013-05-23: qty 50

## 2013-05-23 NOTE — Progress Notes (Signed)
Spoke with pt and informed to arrive at 1000 with stated understanding.

## 2013-05-24 ENCOUNTER — Inpatient Hospital Stay (HOSPITAL_COMMUNITY)
Admission: RE | Admit: 2013-05-24 | Discharge: 2013-05-26 | DRG: 470 | Disposition: A | Discharge status: Home / Self Care | Payer: Medicare Other | Source: Ambulatory Visit | Attending: Orthopedic Surgery | Admitting: Orthopedic Surgery

## 2013-05-24 ENCOUNTER — Encounter (HOSPITAL_COMMUNITY): Payer: Medicare Other | Admitting: Vascular Surgery

## 2013-05-24 ENCOUNTER — Encounter (HOSPITAL_COMMUNITY): Payer: Self-pay | Admitting: *Deleted

## 2013-05-24 ENCOUNTER — Encounter (HOSPITAL_COMMUNITY)
Admission: RE | Disposition: A | Discharge status: Home / Self Care | Payer: Self-pay | Source: Ambulatory Visit | Attending: Orthopedic Surgery

## 2013-05-24 ENCOUNTER — Inpatient Hospital Stay (HOSPITAL_COMMUNITY): Payer: Medicare Other | Admitting: Anesthesiology

## 2013-05-24 ENCOUNTER — Inpatient Hospital Stay (HOSPITAL_COMMUNITY): Payer: Medicare Other

## 2013-05-24 DIAGNOSIS — F3289 Other specified depressive episodes: Secondary | ICD-10-CM | POA: Diagnosis present

## 2013-05-24 DIAGNOSIS — Z8673 Personal history of transient ischemic attack (TIA), and cerebral infarction without residual deficits: Secondary | ICD-10-CM

## 2013-05-24 DIAGNOSIS — J449 Chronic obstructive pulmonary disease, unspecified: Secondary | ICD-10-CM | POA: Diagnosis present

## 2013-05-24 DIAGNOSIS — F329 Major depressive disorder, single episode, unspecified: Secondary | ICD-10-CM | POA: Diagnosis present

## 2013-05-24 DIAGNOSIS — Z79899 Other long term (current) drug therapy: Secondary | ICD-10-CM

## 2013-05-24 DIAGNOSIS — Z9849 Cataract extraction status, unspecified eye: Secondary | ICD-10-CM

## 2013-05-24 DIAGNOSIS — Z01812 Encounter for preprocedural laboratory examination: Secondary | ICD-10-CM

## 2013-05-24 DIAGNOSIS — E785 Hyperlipidemia, unspecified: Secondary | ICD-10-CM | POA: Diagnosis present

## 2013-05-24 DIAGNOSIS — G4730 Sleep apnea, unspecified: Secondary | ICD-10-CM | POA: Diagnosis present

## 2013-05-24 DIAGNOSIS — Z6841 Body Mass Index (BMI) 40.0 and over, adult: Secondary | ICD-10-CM

## 2013-05-24 DIAGNOSIS — K219 Gastro-esophageal reflux disease without esophagitis: Secondary | ICD-10-CM | POA: Diagnosis present

## 2013-05-24 DIAGNOSIS — J4489 Other specified chronic obstructive pulmonary disease: Secondary | ICD-10-CM | POA: Diagnosis present

## 2013-05-24 DIAGNOSIS — Z7982 Long term (current) use of aspirin: Secondary | ICD-10-CM

## 2013-05-24 DIAGNOSIS — I509 Heart failure, unspecified: Secondary | ICD-10-CM | POA: Diagnosis present

## 2013-05-24 DIAGNOSIS — Z87891 Personal history of nicotine dependence: Secondary | ICD-10-CM

## 2013-05-24 DIAGNOSIS — M1710 Unilateral primary osteoarthritis, unspecified knee: Principal | ICD-10-CM | POA: Diagnosis present

## 2013-05-24 DIAGNOSIS — M179 Osteoarthritis of knee, unspecified: Secondary | ICD-10-CM | POA: Diagnosis present

## 2013-05-24 DIAGNOSIS — I10 Essential (primary) hypertension: Secondary | ICD-10-CM | POA: Diagnosis present

## 2013-05-24 DIAGNOSIS — F411 Generalized anxiety disorder: Secondary | ICD-10-CM | POA: Diagnosis present

## 2013-05-24 HISTORY — PX: TOTAL KNEE ARTHROPLASTY: SHX125

## 2013-05-24 SURGERY — ARTHROPLASTY, KNEE, TOTAL
Anesthesia: General | Site: Knee | Laterality: Right

## 2013-05-24 MED ORDER — ACETAMINOPHEN 10 MG/ML IV SOLN
INTRAVENOUS | Status: DC | PRN
  Administered 2013-05-24: 1000 mg via INTRAVENOUS

## 2013-05-24 MED ORDER — STERILE WATER FOR INJECTION IJ SOLN
INTRAMUSCULAR | Status: AC
  Filled 2013-05-24: qty 10

## 2013-05-24 MED ORDER — ONDANSETRON HCL 4 MG/2ML IJ SOLN
INTRAMUSCULAR | Status: DC | PRN
  Administered 2013-05-24: 4 mg via INTRAVENOUS

## 2013-05-24 MED ORDER — PROPOFOL 10 MG/ML IV BOLUS
INTRAVENOUS | Status: AC
  Filled 2013-05-24: qty 20

## 2013-05-24 MED ORDER — HYDROMORPHONE HCL PF 1 MG/ML IJ SOLN: 0.5000 mg | INTRAMUSCULAR | Status: DC | PRN

## 2013-05-24 MED ORDER — BUPIVACAINE HCL (PF) 0.25 % IJ SOLN
INTRAMUSCULAR | Status: DC | PRN
  Administered 2013-05-24: 10 mL

## 2013-05-24 MED ORDER — DEXAMETHASONE SODIUM PHOSPHATE 10 MG/ML IJ SOLN
INTRAMUSCULAR | Status: DC | PRN
  Administered 2013-05-24: 10 mg via INTRAVENOUS

## 2013-05-24 MED ORDER — METOCLOPRAMIDE HCL 5 MG/ML IJ SOLN
INTRAMUSCULAR | Status: AC
  Filled 2013-05-24: qty 2

## 2013-05-24 MED ORDER — LEVOTHYROXINE SODIUM 50 MCG PO TABS
50.0000 ug | ORAL_TABLET | Freq: Every day | ORAL | Status: DC
  Administered 2013-05-25 – 2013-05-26 (×2): 50 ug via ORAL
  Filled 2013-05-24 (×3): qty 1

## 2013-05-24 MED ORDER — OXYCODONE HCL 5 MG PO TABS: 5.0000 mg | ORAL_TABLET | Freq: Once | ORAL | Status: DC | PRN

## 2013-05-24 MED ORDER — LACTATED RINGERS IV SOLN: INTRAVENOUS | Status: DC

## 2013-05-24 MED ORDER — ONDANSETRON HCL 4 MG/2ML IJ SOLN: 4.0000 mg | Freq: Four times a day (QID) | INTRAMUSCULAR | Status: DC | PRN

## 2013-05-24 MED ORDER — VECURONIUM BROMIDE 10 MG IV SOLR
INTRAVENOUS | Status: AC
  Filled 2013-05-24: qty 10

## 2013-05-24 MED ORDER — ALBUTEROL SULFATE (2.5 MG/3ML) 0.083% IN NEBU: 3.0000 mL | INHALATION_SOLUTION | Freq: Four times a day (QID) | RESPIRATORY_TRACT | Status: DC | PRN

## 2013-05-24 MED ORDER — ACETAMINOPHEN 10 MG/ML IV SOLN
INTRAVENOUS | Status: AC
  Filled 2013-05-24: qty 100

## 2013-05-24 MED ORDER — POTASSIUM CHLORIDE IN NACL 20-0.9 MEQ/L-% IV SOLN
INTRAVENOUS | Status: DC
  Administered 2013-05-24: 21:00:00 via INTRAVENOUS
  Filled 2013-05-24 (×6): qty 1000

## 2013-05-24 MED ORDER — METHOCARBAMOL 500 MG PO TABS
500.0000 mg | ORAL_TABLET | Freq: Four times a day (QID) | ORAL | Status: DC | PRN
  Administered 2013-05-25 – 2013-05-26 (×2): 500 mg via ORAL
  Filled 2013-05-24 (×2): qty 1

## 2013-05-24 MED ORDER — ARTIFICIAL TEARS OP OINT
TOPICAL_OINTMENT | OPHTHALMIC | Status: DC | PRN
  Administered 2013-05-24: 1 via OPHTHALMIC

## 2013-05-24 MED ORDER — SIMVASTATIN 10 MG PO TABS
10.0000 mg | ORAL_TABLET | Freq: Every day | ORAL | Status: DC
  Administered 2013-05-24 – 2013-05-25 (×2): 10 mg via ORAL
  Filled 2013-05-24 (×3): qty 1

## 2013-05-24 MED ORDER — POTASSIUM CHLORIDE CRYS ER 20 MEQ PO TBCR
20.0000 meq | EXTENDED_RELEASE_TABLET | Freq: Every day | ORAL | Status: DC
  Administered 2013-05-24 – 2013-05-26 (×3): 20 meq via ORAL
  Filled 2013-05-24 (×3): qty 1

## 2013-05-24 MED ORDER — DEXAMETHASONE SODIUM PHOSPHATE 10 MG/ML IJ SOLN
10.0000 mg | Freq: Three times a day (TID) | INTRAMUSCULAR | Status: AC
  Filled 2013-05-24 (×3): qty 1

## 2013-05-24 MED ORDER — METOCLOPRAMIDE HCL 10 MG PO TABS: 5.0000 mg | ORAL_TABLET | Freq: Three times a day (TID) | ORAL | Status: DC | PRN

## 2013-05-24 MED ORDER — LIDOCAINE HCL (CARDIAC) 20 MG/ML IV SOLN
INTRAVENOUS | Status: DC | PRN
  Administered 2013-05-24: 100 mg via INTRAVENOUS

## 2013-05-24 MED ORDER — MENTHOL 3 MG MT LOZG: 1.0000 | LOZENGE | OROMUCOSAL | Status: DC | PRN

## 2013-05-24 MED ORDER — SODIUM CHLORIDE 0.9 % IR SOLN
Status: DC | PRN
  Administered 2013-05-24: 3000 mL

## 2013-05-24 MED ORDER — VECURONIUM BROMIDE 10 MG IV SOLR
INTRAVENOUS | Status: DC | PRN
  Administered 2013-05-24: 2 mg via INTRAVENOUS

## 2013-05-24 MED ORDER — NEOSTIGMINE METHYLSULFATE 1 MG/ML IJ SOLN
INTRAMUSCULAR | Status: DC | PRN
  Administered 2013-05-24: 5 mg via INTRAVENOUS

## 2013-05-24 MED ORDER — ALPRAZOLAM 0.5 MG PO TABS
2.0000 mg | ORAL_TABLET | Freq: Three times a day (TID) | ORAL | Status: DC | PRN
  Administered 2013-05-24: 2 mg via ORAL
  Filled 2013-05-24: qty 4

## 2013-05-24 MED ORDER — ALBUTEROL SULFATE (2.5 MG/3ML) 0.083% IN NEBU
2.5000 mg | INHALATION_SOLUTION | Freq: Three times a day (TID) | RESPIRATORY_TRACT | Status: DC
  Administered 2013-05-24 – 2013-05-25 (×3): 2.5 mg via RESPIRATORY_TRACT
  Filled 2013-05-24 (×5): qty 3

## 2013-05-24 MED ORDER — LACTATED RINGERS IV SOLN
INTRAVENOUS | Status: DC | PRN
  Administered 2013-05-24 (×2): via INTRAVENOUS

## 2013-05-24 MED ORDER — CARBOXYMETHYLCELLULOSE SODIUM 0.25 % OP SOLN: 1.0000 [drp] | Freq: Every day | OPHTHALMIC | Status: DC

## 2013-05-24 MED ORDER — ASPIRIN EC 325 MG PO TBEC
325.0000 mg | DELAYED_RELEASE_TABLET | Freq: Every day | ORAL | Status: DC
  Administered 2013-05-25 – 2013-05-26 (×2): 325 mg via ORAL
  Filled 2013-05-24 (×3): qty 1

## 2013-05-24 MED ORDER — METHOCARBAMOL 100 MG/ML IJ SOLN: 500.0000 mg | Freq: Four times a day (QID) | INTRAVENOUS | Status: DC | PRN

## 2013-05-24 MED ORDER — DOCUSATE SODIUM 100 MG PO CAPS
100.0000 mg | ORAL_CAPSULE | Freq: Two times a day (BID) | ORAL | Status: DC
  Administered 2013-05-24 – 2013-05-26 (×4): 100 mg via ORAL
  Filled 2013-05-24 (×5): qty 1

## 2013-05-24 MED ORDER — ARTIFICIAL TEARS OP OINT
TOPICAL_OINTMENT | OPHTHALMIC | Status: AC
  Filled 2013-05-24: qty 3.5

## 2013-05-24 MED ORDER — FENTANYL CITRATE 0.05 MG/ML IJ SOLN
INTRAMUSCULAR | Status: DC | PRN
  Administered 2013-05-24: 50 ug via INTRAVENOUS
  Administered 2013-05-24: 150 ug via INTRAVENOUS
  Administered 2013-05-24 (×2): 50 ug via INTRAVENOUS

## 2013-05-24 MED ORDER — EPHEDRINE SULFATE 50 MG/ML IJ SOLN
INTRAMUSCULAR | Status: DC | PRN
  Administered 2013-05-24: 15 mg via INTRAVENOUS

## 2013-05-24 MED ORDER — FENTANYL CITRATE 0.05 MG/ML IJ SOLN
25.0000 ug | INTRAMUSCULAR | Status: DC | PRN
  Administered 2013-05-24 (×2): 50 ug via INTRAVENOUS

## 2013-05-24 MED ORDER — FENTANYL CITRATE 0.05 MG/ML IJ SOLN
INTRAMUSCULAR | Status: AC
  Filled 2013-05-24: qty 5

## 2013-05-24 MED ORDER — OXYCODONE HCL 5 MG PO TABS
5.0000 mg | ORAL_TABLET | ORAL | Status: DC | PRN
  Administered 2013-05-24 – 2013-05-26 (×10): 10 mg via ORAL
  Filled 2013-05-24 (×10): qty 2

## 2013-05-24 MED ORDER — BISACODYL 10 MG RE SUPP: 10.0000 mg | Freq: Every day | RECTAL | Status: DC | PRN

## 2013-05-24 MED ORDER — SODIUM CHLORIDE 0.9 % IJ SOLN
INTRAMUSCULAR | Status: DC | PRN
  Administered 2013-05-24 (×4): 10 mL via INTRAVENOUS

## 2013-05-24 MED ORDER — METOCLOPRAMIDE HCL 5 MG PO TABS
5.0000 mg | ORAL_TABLET | ORAL | Status: DC
  Administered 2013-05-24 – 2013-05-26 (×2): 5 mg via ORAL
  Filled 2013-05-24 (×2): qty 1

## 2013-05-24 MED ORDER — POLYVINYL ALCOHOL 1.4 % OP SOLN
1.0000 [drp] | Freq: Every day | OPHTHALMIC | Status: DC
  Administered 2013-05-25 – 2013-05-26 (×2): 1 [drp] via OPHTHALMIC
  Filled 2013-05-24: qty 15

## 2013-05-24 MED ORDER — TORSEMIDE 20 MG PO TABS
40.0000 mg | ORAL_TABLET | Freq: Every day | ORAL | Status: DC
  Administered 2013-05-24 – 2013-05-26 (×3): 40 mg via ORAL
  Filled 2013-05-24 (×3): qty 2

## 2013-05-24 MED ORDER — MIDAZOLAM HCL 2 MG/2ML IJ SOLN
INTRAMUSCULAR | Status: AC
  Filled 2013-05-24: qty 2

## 2013-05-24 MED ORDER — DEXAMETHASONE 6 MG PO TABS
10.0000 mg | ORAL_TABLET | Freq: Three times a day (TID) | ORAL | Status: AC
  Administered 2013-05-24 – 2013-05-25 (×3): 10 mg via ORAL
  Filled 2013-05-24 (×3): qty 1

## 2013-05-24 MED ORDER — METOCLOPRAMIDE HCL 5 MG/ML IJ SOLN
INTRAMUSCULAR | Status: DC | PRN
  Administered 2013-05-24: 10 mg via INTRAVENOUS

## 2013-05-24 MED ORDER — FENTANYL CITRATE 0.05 MG/ML IJ SOLN
INTRAMUSCULAR | Status: AC
Start: 2013-05-24 — End: 2013-05-25
  Filled 2013-05-24: qty 2

## 2013-05-24 MED ORDER — ACETAMINOPHEN 650 MG RE SUPP: 650.0000 mg | Freq: Four times a day (QID) | RECTAL | Status: DC | PRN

## 2013-05-24 MED ORDER — DEXAMETHASONE SODIUM PHOSPHATE 10 MG/ML IJ SOLN
INTRAMUSCULAR | Status: AC
  Filled 2013-05-24: qty 1

## 2013-05-24 MED ORDER — ACETAMINOPHEN 325 MG PO TABS: 650.0000 mg | ORAL_TABLET | Freq: Four times a day (QID) | ORAL | Status: DC | PRN

## 2013-05-24 MED ORDER — OXYCODONE HCL 5 MG/5ML PO SOLN: 5.0000 mg | Freq: Once | ORAL | Status: DC | PRN

## 2013-05-24 MED ORDER — MIDAZOLAM HCL 5 MG/5ML IJ SOLN
INTRAMUSCULAR | Status: DC | PRN
  Administered 2013-05-24: 2 mg via INTRAVENOUS

## 2013-05-24 MED ORDER — PROPOFOL 10 MG/ML IV BOLUS
INTRAVENOUS | Status: DC | PRN
  Administered 2013-05-24: 150 mg via INTRAVENOUS

## 2013-05-24 MED ORDER — PANTOPRAZOLE SODIUM 40 MG PO TBEC
80.0000 mg | DELAYED_RELEASE_TABLET | Freq: Every day | ORAL | Status: DC
  Administered 2013-05-25: 80 mg via ORAL
  Filled 2013-05-24: qty 2

## 2013-05-24 MED ORDER — ONDANSETRON HCL 4 MG/2ML IJ SOLN
INTRAMUSCULAR | Status: AC
  Filled 2013-05-24: qty 2

## 2013-05-24 MED ORDER — ONDANSETRON HCL 4 MG PO TABS: 4.0000 mg | ORAL_TABLET | Freq: Four times a day (QID) | ORAL | Status: DC | PRN

## 2013-05-24 MED ORDER — ROCURONIUM BROMIDE 100 MG/10ML IV SOLN
INTRAVENOUS | Status: DC | PRN
  Administered 2013-05-24: 50 mg via INTRAVENOUS

## 2013-05-24 MED ORDER — BUPIVACAINE LIPOSOME 1.3 % IJ SUSP
INTRAMUSCULAR | Status: DC | PRN
  Administered 2013-05-24: 20 mL

## 2013-05-24 MED ORDER — DIPHENHYDRAMINE HCL 12.5 MG/5ML PO ELIX: 12.5000 mg | ORAL_SOLUTION | ORAL | Status: DC | PRN

## 2013-05-24 MED ORDER — PHENOL 1.4 % MT LIQD: 1.0000 | OROMUCOSAL | Status: DC | PRN

## 2013-05-24 MED ORDER — BUPIVACAINE LIPOSOME 1.3 % IJ SUSP
20.0000 mL | Freq: Once | INTRAMUSCULAR | Status: DC
  Filled 2013-05-24: qty 20

## 2013-05-24 MED ORDER — CLINDAMYCIN PHOSPHATE 600 MG/50ML IV SOLN
600.0000 mg | Freq: Four times a day (QID) | INTRAVENOUS | Status: AC
  Administered 2013-05-24 – 2013-05-25 (×2): 600 mg via INTRAVENOUS
  Filled 2013-05-24 (×2): qty 50

## 2013-05-24 MED ORDER — METOCLOPRAMIDE HCL 5 MG/ML IJ SOLN: 5.0000 mg | Freq: Three times a day (TID) | INTRAMUSCULAR | Status: DC | PRN

## 2013-05-24 MED ORDER — GLYCOPYRROLATE 0.2 MG/ML IJ SOLN
INTRAMUSCULAR | Status: DC | PRN
  Administered 2013-05-24: 1 mg via INTRAVENOUS

## 2013-05-24 SURGICAL SUPPLY — 63 items
BANDAGE ESMARK 6X9 LF (GAUZE/BANDAGES/DRESSINGS) ×1 IMPLANT
BENZOIN TINCTURE PRP APPL 2/3 (GAUZE/BANDAGES/DRESSINGS) ×2 IMPLANT
BLADE SAG 18X100X1.27 (BLADE) ×4 IMPLANT
BNDG ESMARK 6X9 LF (GAUZE/BANDAGES/DRESSINGS) ×2
BOWL SMART MIX CTS (DISPOSABLE) ×2 IMPLANT
CEMENT BONE SIMPLEX SPEEDSET (Cement) ×4 IMPLANT
CLSR STERI-STRIP ANTIMIC 1/2X4 (GAUZE/BANDAGES/DRESSINGS) ×4 IMPLANT
COVER SURGICAL LIGHT HANDLE (MISCELLANEOUS) ×2 IMPLANT
CUFF TOURNIQUET SINGLE 44IN (TOURNIQUET CUFF) ×2 IMPLANT
DRAPE EXTREMITY T 121X128X90 (DRAPE) ×2 IMPLANT
DRAPE PROXIMA HALF (DRAPES) ×2 IMPLANT
DRAPE U-SHAPE 47X51 STRL (DRAPES) ×2 IMPLANT
DRSG PAD ABDOMINAL 8X10 ST (GAUZE/BANDAGES/DRESSINGS) ×2 IMPLANT
DURAPREP 26ML APPLICATOR (WOUND CARE) ×4 IMPLANT
ELECT CAUTERY BLADE 6.4 (BLADE) ×2 IMPLANT
ELECT REM PT RETURN 9FT ADLT (ELECTROSURGICAL) ×2
ELECTRODE REM PT RTRN 9FT ADLT (ELECTROSURGICAL) ×1 IMPLANT
EVACUATOR 1/8 PVC DRAIN (DRAIN) ×2 IMPLANT
FACESHIELD WRAPAROUND (MASK) ×4 IMPLANT
GLOVE BIOGEL PI IND STRL 7.0 (GLOVE) ×2 IMPLANT
GLOVE BIOGEL PI INDICATOR 7.0 (GLOVE) ×2
GLOVE ECLIPSE 6.5 STRL STRAW (GLOVE) ×4 IMPLANT
GLOVE ORTHO TXT STRL SZ7.5 (GLOVE) ×2 IMPLANT
GOWN STRL REUS W/ TWL LRG LVL3 (GOWN DISPOSABLE) ×1 IMPLANT
GOWN STRL REUS W/ TWL XL LVL3 (GOWN DISPOSABLE) ×1 IMPLANT
GOWN STRL REUS W/TWL LRG LVL3 (GOWN DISPOSABLE) ×1
GOWN STRL REUS W/TWL XL LVL3 (GOWN DISPOSABLE) ×1
HANDPIECE INTERPULSE COAX TIP (DISPOSABLE) ×1
IMMOBILIZER KNEE 20 (SOFTGOODS) ×2
IMMOBILIZER KNEE 20 THIGH 36 (SOFTGOODS) ×1 IMPLANT
IMMOBILIZER KNEE 22 UNIV (SOFTGOODS) ×2 IMPLANT
IMMOBILIZER KNEE 24 THIGH 36 (MISCELLANEOUS) IMPLANT
IMMOBILIZER KNEE 24 UNIV (MISCELLANEOUS)
KIT BASIN OR (CUSTOM PROCEDURE TRAY) ×2 IMPLANT
KIT ROOM TURNOVER OR (KITS) ×2 IMPLANT
KNEE/VIT E POLY LINER LEVEL 1B ×2 IMPLANT
MANIFOLD NEPTUNE II (INSTRUMENTS) ×2 IMPLANT
NEEDLE 18GX1X1/2 (RX/OR ONLY) (NEEDLE) ×2 IMPLANT
NEEDLE 25GX 5/8IN NON SAFETY (NEEDLE) ×2 IMPLANT
NS IRRIG 1000ML POUR BTL (IV SOLUTION) ×2 IMPLANT
PACK TOTAL JOINT (CUSTOM PROCEDURE TRAY) ×2 IMPLANT
PAD ABD 8X10 STRL (GAUZE/BANDAGES/DRESSINGS) ×2 IMPLANT
PAD ARMBOARD 7.5X6 YLW CONV (MISCELLANEOUS) ×4 IMPLANT
PAD CAST 4YDX4 CTTN HI CHSV (CAST SUPPLIES) ×1 IMPLANT
PADDING CAST COTTON 4X4 STRL (CAST SUPPLIES) ×1
PADDING CAST COTTON 6X4 STRL (CAST SUPPLIES) ×2 IMPLANT
SET HNDPC FAN SPRY TIP SCT (DISPOSABLE) ×1 IMPLANT
SPONGE GAUZE 4X4 12PLY (GAUZE/BANDAGES/DRESSINGS) ×2 IMPLANT
STRIP CLOSURE SKIN 1/2X4 (GAUZE/BANDAGES/DRESSINGS) ×4 IMPLANT
SUCTION FRAZIER TIP 10 FR DISP (SUCTIONS) ×2 IMPLANT
SUT MNCRL AB 4-0 PS2 18 (SUTURE) ×2 IMPLANT
SUT VIC AB 0 CT1 27 (SUTURE) ×1
SUT VIC AB 0 CT1 27XBRD ANBCTR (SUTURE) ×1 IMPLANT
SUT VIC AB 1 CTX 36 (SUTURE) ×1
SUT VIC AB 1 CTX36XBRD ANBCTR (SUTURE) ×1 IMPLANT
SUT VIC AB 2-0 CT1 27 (SUTURE) ×2
SUT VIC AB 2-0 CT1 36 (SUTURE) ×4 IMPLANT
SUT VIC AB 2-0 CT1 TAPERPNT 27 (SUTURE) ×2 IMPLANT
SYR 50ML LL SCALE MARK (SYRINGE) ×2 IMPLANT
SYR CONTROL 10ML LL (SYRINGE) ×2 IMPLANT
TOWEL OR 17X24 6PK STRL BLUE (TOWEL DISPOSABLE) ×2 IMPLANT
TOWEL OR 17X26 10 PK STRL BLUE (TOWEL DISPOSABLE) ×2 IMPLANT
WATER STERILE IRR 1000ML POUR (IV SOLUTION) ×4 IMPLANT

## 2013-05-24 NOTE — Discharge Instructions (Signed)
Total Knee Replacement Care After Refer to this sheet in the next few weeks. These instructions provide you with information on caring for yourself after your procedure. Your caregiver also may give you specific instructions. Your treatment has been planned according to the most current medical practices, but problems sometimes occur. Call your caregiver if you have any problems or questions after your procedure. HOME CARE INSTRUCTIONS   Weight bearing as tolerated.  Take Aspirin 1 tab a day for the next 30 days to prevent blood clots.  May change dressing on daily starting on Saturday.  May shower on Monday, but do not let incision get wet.  May apply ice for up to 20 minutes at a time for pain and swelling.  Follow up appointment in our office in two weeks.     See a physical therapist as directed by your caregiver.  Take over-the-counter or prescription medicines for pain, discomfort, or fever only as directed by your caregiver.  Avoid lifting or driving until you are instructed otherwise.  If you have been sent home with a continuous passive motion machine, use it as directed by your caregiver. SEEK MEDICAL CARE IF:  You have difficulty breathing.  Your wound is red, swollen, or has become increasingly painful.  You have pus draining from your wound.  You have a bad smell coming from your wound.  You have persistent bleeding from your wound.  Your wound breaks open after sutures (stitches) or staples have been removed. SEEK IMMEDIATE MEDICAL CARE IF:   You have a fever.  You have a rash.  You have pain or swelling in your calf or thigh.  You have shortness of breath or chest pain.  Your range of motion in your knee is decreasing rather than increasing. MAKE SURE YOU:   Understand these instructions.  Will watch your condition.  Will get help right away if you are not doing well or get worse. Document Released: 08/22/2004 Document Revised: 08/04/2011 Document  Reviewed: 03/24/2011 [facility] Patient Information 2014 [place].

## 2013-05-24 NOTE — Anesthesia Postprocedure Evaluation (Deleted)
Anesthesia Post Note  Patient: Holly Flores  Procedure(s) Performed: Procedure(s) (LRB): TOTAL KNEE ARTHROPLASTY (Right)  Anesthesia type: general  Patient location: PACU  Post pain: Pain level controlled  Post assessment: Patient's Cardiovascular Status Stable  Last Vitals:  Filed Vitals:   05/24/13 1500  BP: 113/76  Pulse: 58  Temp: 36.4 C  Resp: 15    Post vital signs: Reviewed and stable  Level of consciousness: sedated  Complications: No apparent anesthesia complications

## 2013-05-24 NOTE — Anesthesia Procedure Notes (Signed)
Procedure Name: Intubation Date/Time: 05/24/2013 12:44 PM Performed by: Neldon Newport Pre-anesthesia Checklist: Patient identified, Timeout performed, Emergency Drugs available, Suction available and Patient being monitored Patient Re-evaluated:Patient Re-evaluated prior to inductionOxygen Delivery Method: Circle system utilized Preoxygenation: Pre-oxygenation with 100% oxygen Intubation Type: IV induction Ventilation: Mask ventilation without difficulty Laryngoscope Size: Mac and 3 (intubation by [employer] student) Grade View: Grade I Tube type: Oral Tube size: 7.5 mm Number of attempts: 1 Placement Confirmation: positive ETCO2,  ETT inserted through vocal cords under direct vision and breath sounds checked- equal and bilateral Secured at: 21 cm Tube secured with: Tape Dental Injury: Teeth and Oropharynx as per pre-operative assessment

## 2013-05-24 NOTE — Anesthesia Preprocedure Evaluation (Addendum)
Anesthesia Evaluation  Patient identified by MRN, date of birth, ID band Patient awake    Reviewed: Allergy & Precautions, H&P , NPO status , Patient's Chart, lab work & pertinent test results  Airway Mallampati: II  Neck ROM: full    Dental  (+) Edentulous Upper, Edentulous Lower, Dental Advidsory Given   Pulmonary sleep apnea , COPDformer smoker,          Cardiovascular hypertension, +CHF     Neuro/Psych Anxiety Depression CVA    GI/Hepatic GERD-  ,  Endo/Other  Morbid obesity  Renal/GU      Musculoskeletal  (+) Arthritis -,   Abdominal   Peds  Hematology   Anesthesia Other Findings   Reproductive/Obstetrics                          Anesthesia Physical Anesthesia Plan  ASA: III  Anesthesia Plan: General   Post-op Pain Management:    Induction: Intravenous  Airway Management Planned: Oral ETT  Additional Equipment:   Intra-op Plan:   Post-operative Plan: Extubation in OR  Informed Consent: I have reviewed the patients History and Physical, chart, labs and discussed the procedure including the risks, benefits and alternatives for the proposed anesthesia with the patient or authorized representative who has indicated his/her understanding and acceptance.   Dental Advisory Given  Plan Discussed with: CRNA, Anesthesiologist and Surgeon  Anesthesia Plan Comments:        Anesthesia Quick Evaluation

## 2013-05-24 NOTE — Progress Notes (Signed)
Orthopedic Tech Progress Note Patient Details:  JONETTE WASSEL 08/18/1945 [id number]  CPM Right Knee CPM Right Knee: On Right Knee Flexion (Degrees): 60 Right Knee Extension (Degrees): 0 Additional Comments: put ohf on bed   Braulio Bosch 05/24/2013, 3:49 PM

## 2013-05-24 NOTE — H&P (View-Only) (Signed)
TOTAL KNEE ADMISSION H&P  Patient is being admitted for right total knee arthroplasty.  Subjective:  Chief Complaint:right knee pain.  HPI: Holly Flores, 68 y.o. female, has a history of pain and functional disability in the right knee due to arthritis and has failed non-surgical conservative treatments for greater than 12 weeks to includeNSAID's and/or analgesics, corticosteriod injections and activity modification.  Onset of symptoms was gradual, starting 6 years ago with gradually worsening course since that time. The patient noted no past surgery on the right knee(s).  Patient currently rates pain in the right knee(s) at 6 out of 10 with activity. Patient has night pain, worsening of pain with activity and weight bearing, pain that interferes with activities of daily living, pain with passive range of motion, crepitus and joint swelling.  Patient has evidence of subchondral sclerosis, periarticular osteophytes and joint space narrowing by imaging studies. There is no active infection.  Patient Active Problem List   Diagnosis Date Noted  . Chest pain 04/20/2013  . Dyspnea 04/20/2013  . Hyperlipidemia 04/20/2013   Past Medical History  Diagnosis Date  . CHF (congestive heart failure)   . Arthritis   . Stroke   . Hypertension   . GERD (gastroesophageal reflux disease)   . COPD (chronic obstructive pulmonary disease)   . Shortness of breath   . Anxiety   . Depression   . Hyperlipidemia   . Goiter   . Kidney cysts     left side  . Sleep apnea     Past Surgical History  Procedure Laterality Date  . Back surgery    . Knee surgery    . Cholecystectomy    . Tonsillectomy    . Cesarean section    . Cervical spine surgery    . Cardiac catheterization    . Abdominal hysterectomy    . Breast biopsy      left-non cancerous  . Cataract extraction w/phaco  09/21/2011    Procedure: CATARACT EXTRACTION PHACO AND INTRAOCULAR LENS PLACEMENT (IOC);  Surgeon: Williams Che, MD;   Location: [facility];  Service: Ophthalmology;  Laterality: Left;  CDE:9.78     (Not in a hospital admission) Allergies  Allergen Reactions  . Sulfa Antibiotics Swelling    Tongue swelling  . Penicillins Hives, Itching and Rash    History  Substance Use Topics  . Smoking status: Former Smoker -- 5 years    Types: Cigarettes    Quit date: 04/20/2008  . Smokeless tobacco: Never Used  . Alcohol Use: No    No family history on file.   Review of Systems  Constitutional: Negative.   HENT: Positive for nosebleeds.   Eyes: Negative.   Respiratory: Negative.   Cardiovascular: Negative.   Gastrointestinal: Positive for heartburn. Negative for nausea and vomiting.  Genitourinary: Negative.   Musculoskeletal: Positive for joint pain.  Skin: Negative.   Neurological: Positive for headaches.  Endo/Heme/Allergies: Bruises/bleeds easily.  Psychiatric/Behavioral: Negative.     Objective:  Physical Exam  Constitutional: She is oriented to person, place, and time. She appears well-developed and well-nourished.  HENT:  Head: Normocephalic and atraumatic.  Eyes: EOM are normal. Pupils are equal, round, and reactive to light.  Neck: Normal range of motion. Thyromegaly present.  Cardiovascular: Normal rate and regular rhythm.   Respiratory: Effort normal and breath sounds normal. No respiratory distress. She has no wheezes. She has no rales.  GI: Soft. Bowel sounds are normal. She exhibits no distension. There is no tenderness.  Musculoskeletal:  ROM 5-90, 5 degrees of varus.  Marked tibial femoral and patellofemoral crepitus.   Negative log roll.  Negative straight leg raise.  Neurovascularly intact distally.   Neurological: She is alert and oriented to person, place, and time.  Skin: Skin is warm and dry.  Psychiatric: She has a normal mood and affect. Her behavior is normal. Judgment and thought content normal.    Vital signs in last 24 hours: @VSRANGES @  Labs:   Estimated body  mass index is 43.86 kg/(m^2) as calculated from the following:   Height as of 04/20/13: 5\' 1"  (1.549 m).   Weight as of 04/20/13: 105.235 kg (232 lb).   Imaging Review Plain radiographs demonstrate severe degenerative joint disease of the right knee(s). The overall alignment ismild varus. The bone quality appears to be fair for age and reported activity level.  Assessment/Plan:  End stage arthritis, right knee   The patient history, physical examination, clinical judgment of the provider and imaging studies are consistent with end stage degenerative joint disease of the right knee(s) and total knee arthroplasty is deemed medically necessary. The treatment options including medical management, injection therapy arthroscopy and arthroplasty were discussed at length. The risks and benefits of total knee arthroplasty were presented and reviewed. The risks due to aseptic loosening, infection, stiffness, patella tracking problems, thromboembolic complications and other imponderables were discussed. The patient acknowledged the explanation, agreed to proceed with the plan and consent was signed. Patient is being admitted for inpatient treatment for surgery, pain control, PT, OT, prophylactic antibiotics, VTE prophylaxis, progressive ambulation and ADL's and discharge planning. The patient is planning to be discharged home with home health services

## 2013-05-24 NOTE — Transfer of Care (Signed)
Immediate Anesthesia Transfer of Care Note  Patient: Holly Flores  Procedure(s) Performed: Procedure(s): TOTAL KNEE ARTHROPLASTY (Right)  Patient Location: PACU  Anesthesia Type:General  Level of Consciousness: sedated and responds to stimulation  Airway & Oxygen Therapy: Patient Spontanous Breathing and Patient connected to face mask oxygen  Post-op Assessment: Report given to PACU RN, Post -op Vital signs reviewed and stable and Patient moving all extremities  Post vital signs: Reviewed and stable  Complications: No apparent anesthesia complications

## 2013-05-24 NOTE — Interval H&P Note (Signed)
History and Physical Interval Note:  05/24/2013 8:16 AM  Holly Flores  has presented today for surgery, with the diagnosis of DJD RIGHT KNEE  The various methods of treatment have been discussed with the patient and family. After consideration of risks, benefits and other options for treatment, the patient has consented to  Procedure(s): TOTAL KNEE ARTHROPLASTY (Right) as a surgical intervention .  The patient's history has been reviewed, patient examined, no change in status, stable for surgery.  I have reviewed the patient's chart and labs.  Questions were answered to the patient's satisfaction.     [name]

## 2013-05-24 NOTE — Progress Notes (Signed)
Utilization review completed.  

## 2013-05-25 ENCOUNTER — Encounter (HOSPITAL_COMMUNITY): Payer: Self-pay | Admitting: General Practice

## 2013-05-25 LAB — CBC
HEMATOCRIT: 37.2 % (ref 36.0–46.0)
HEMOGLOBIN: 12.1 g/dL (ref 12.0–15.0)
MCH: 30.1 pg (ref 26.0–34.0)
MCHC: 32.5 g/dL (ref 30.0–36.0)
MCV: 92.5 fL (ref 78.0–100.0)
Platelets: 242 10*3/uL (ref 150–400)
RBC: 4.02 MIL/uL (ref 3.87–5.11)
RDW: 14.4 % (ref 11.5–15.5)
WBC: 18.7 10*3/uL — ABNORMAL HIGH (ref 4.0–10.5)

## 2013-05-25 LAB — BASIC METABOLIC PANEL
BUN: 16 mg/dL (ref 6–23)
CALCIUM: 8.7 mg/dL (ref 8.4–10.5)
CO2: 25 mEq/L (ref 19–32)
CREATININE: 1.26 mg/dL — AB (ref 0.50–1.10)
Chloride: 99 mEq/L (ref 96–112)
GFR calc non Af Amer: 43 mL/min — ABNORMAL LOW (ref >90)
GFR, EST AFRICAN AMERICAN: 50 mL/min — AB (ref >90)
GLUCOSE: 155 mg/dL — AB (ref 70–99)
Potassium: 4.2 mEq/L (ref 3.7–5.3)
Sodium: 140 mEq/L (ref 137–147)

## 2013-05-25 MED ORDER — METHOCARBAMOL 500 MG PO TABS: 500.0000 mg | ORAL_TABLET | Freq: Four times a day (QID) | ORAL | Status: DC

## 2013-05-25 MED ORDER — ASPIRIN EC 325 MG PO TBEC: 325.0000 mg | DELAYED_RELEASE_TABLET | Freq: Every day | ORAL | Status: DC

## 2013-05-25 MED ORDER — OXYCODONE-ACETAMINOPHEN 5-325 MG PO TABS: 1.0000 | ORAL_TABLET | ORAL | Status: DC | PRN

## 2013-05-25 MED ORDER — ONDANSETRON HCL 4 MG PO TABS: 4.0000 mg | ORAL_TABLET | Freq: Three times a day (TID) | ORAL | Status: DC | PRN

## 2013-05-25 MED ORDER — BISACODYL 5 MG PO TBEC: 5.0000 mg | DELAYED_RELEASE_TABLET | Freq: Every day | ORAL | Status: DC | PRN

## 2013-05-25 NOTE — Progress Notes (Signed)
Subjective: 1 Day Post-Op Procedure(s) (LRB): TOTAL KNEE ARTHROPLASTY (Right) Patient reports pain as 4 on 0-10 scale.  No nausea/vomiting, lightheadedness/dizziness.  Positive flatus but no bm as of yet.  Tolerating diet.  Objective: Vital signs in last 24 hours: Temp:  [97.3 F (36.3 C)-98.5 F (36.9 C)] 98.5 F (36.9 C) (04/09 0629) Pulse Rate:  [56-103] 76 (04/09 0629) Resp:  [5-20] 17 (04/09 0629) BP: (111-132)/(59-112) 117/59 mmHg (04/09 0629) SpO2:  [90 %-100 %] 98 % (04/09 0629) Weight:  [107.049 kg (236 lb)] 107.049 kg (236 lb) (04/08 1017)  Intake/Output from previous day: 04/08 0701 - 04/09 0700 In: 1725 [I.V.:1400] Out: 505 [Drains:405; Blood:100] Intake/Output this shift: Total I/O In: -  Out: 280 [Drains:280]  No results found for this basename: HGB,  in the last 72 hours No results found for this basename: WBC, RBC, HCT, PLT,  in the last 72 hours No results found for this basename: NA, K, CL, CO2, BUN, CREATININE, GLUCOSE, CALCIUM,  in the last 72 hours No results found for this basename: LABPT, INR,  in the last 72 hours  Neurologically intact ABD soft Neurovascular intact Sensation intact distally Intact pulses distally Dorsiflexion/Plantar flexion intact Compartment soft hemovac drain pulled by me today No drainage noted through ace wrap  Assessment/Plan: 1 Day Post-Op Procedure(s) (LRB): TOTAL KNEE ARTHROPLASTY (Right) Advance diet Up with therapy Plan for discharge tomorrow to home Will keep iv fluids for one more day to to decreased GFR pre-op  M. Doran Stabler 05/25/2013, 6:43 AM

## 2013-05-25 NOTE — Progress Notes (Signed)
Occupational Therapy Evaluation and Discharge Patient Details Name: Holly Flores MRN: [id number] DOB: Jun 15, 1945 Today's Date: 05/25/2013    History of Present Illness Pt is 68 y.o female s/p R TKA for DJD.    Clinical Impression   PTA pt lived at home with husband and required assistance for LB bathing and setup for bathing. Pt with history of back surgeries and L TKA. Education and training completed regarding compensatory techniques for LB ADLs and safety during toilet/tub transfers. Pt declined opportunity to practice tub transfer and was in recliner for duration of OT session. Educated and demonstrated safe tub transfer with use of RW and BSC. Pt reports that her husband will be home 24/7 to assist with ADLs. No further acute OT needs. Acute OT to sign off.     Follow Up Recommendations  No OT follow up;Supervision/Assistance - 24 hour    Equipment Recommendations  None recommended by OT       Precautions / Restrictions Precautions Precautions: Knee Required Braces or Orthoses: Knee Immobilizer - Right Restrictions Weight Bearing Restrictions: Yes RLE Weight Bearing: Weight bearing as tolerated              ADL Overall ADL's : Needs assistance/impaired Eating/Feeding: Sitting;Independent   Grooming: Set up;Sitting   Upper Body Bathing: Set up;Sitting   Lower Body Bathing: Minimal assistance;Sit to/from stand   Upper Body Dressing : Set up;Sitting   Lower Body Dressing: Moderate assistance;Sit to/from stand                 General ADL Comments: Pt declined OOB activities and states she has no concerns about toilet/tub transfer. Pt in recliner for duration of OT session.               Pertinent Vitals/Pain No c/o pain     Hand Dominance Right   Extremity/Trunk Assessment Upper Extremity Assessment Upper Extremity Assessment: Overall WFL for tasks assessed   Lower Extremity Assessment Lower Extremity Assessment: Defer to PT evaluation        Communication Communication Communication: No difficulties   Cognition Arousal/Alertness: Awake/alert Behavior During Therapy: WFL for tasks assessed/performed Overall Cognitive Status: Within Functional Limits for tasks assessed                                Home Living Family/patient expects to be discharged to:: Private residence Living Arrangements: Spouse/significant other Available Help at Discharge: Family;Available 24 hours/day Type of Home: House Home Access: Stairs to enter [employer] of Steps: 2 Entrance Stairs-Rails: None Home Layout: One level     Bathroom Shower/Tub: Tub/shower unit;Curtain Shower/tub characteristics: Architectural technologist: Standard     Home Equipment: Walker - standard;Cane - single point;Bedside commode          Prior Functioning/Environment Level of Independence: Needs assistance  Gait / Transfers Assistance Needed: 4-wheeled walker all the time ADL's / Homemaking Assistance Needed: Husband assisted her with dressing and set-up for bathing                                  End of Session CPM Right Knee CPM Right Knee: Off  Activity Tolerance: Patient tolerated treatment well Patient left: in chair;with call bell/phone within reach   Time: 1022-1039 OT Time Calculation (min): 17 min Charges:  OT General Charges $OT Visit: 1 Procedure OT Evaluation $Initial OT Evaluation Tier  I: 1 Procedure OT Treatments $Self Care/Home Management : 8-22 mins  Juluis Rainier [phone] 05/25/2013, 10:43 AM

## 2013-05-25 NOTE — Discharge Summary (Signed)
Patient ID: Holly Flores MRN: [id number] DOB/AGE: 23-Nov-1945 68 y.o.  Admit date: 05/24/2013 Discharge date: 05/26/2013  Admission Diagnoses:  Active Problems:   DJD (degenerative joint disease) of knee   Discharge Diagnoses:  Same  Past Medical History  Diagnosis Date  . CHF (congestive heart failure)   . Arthritis   . Stroke   . Hypertension   . GERD (gastroesophageal reflux disease)   . COPD (chronic obstructive pulmonary disease)   . Shortness of breath   . Anxiety   . Depression   . Hyperlipidemia   . Goiter   . Kidney cysts     left side  . Sleep apnea   . Stroke     OCULAR  LEFT  EYE    LAST YR.    Surgeries: Procedure(s): TOTAL KNEE ARTHROPLASTY on 05/24/2013   Consultants:    Discharged Condition: Improved  Hospital Course: Holly Flores is an 68 y.o. female who was admitted 05/24/2013 for operative treatment of<principal problem not specified>. Patient has severe unremitting pain that affects sleep, daily activities, and work/hobbies. After pre-op clearance the patient was taken to the operating room on 05/24/2013 and underwent  Procedure(s): TOTAL KNEE ARTHROPLASTY.    Patient was given perioperative antibiotics:     Anti-infectives   Start     Dose/Rate Route Frequency Ordered Stop   05/24/13 1800  clindamycin (CLEOCIN) IVPB 600 mg     600 mg 100 mL/hr over 30 Minutes Intravenous Every 6 hours 05/24/13 1655 05/25/13 0129   05/24/13 0600  clindamycin (CLEOCIN) IVPB 900 mg     900 mg 100 mL/hr over 30 Minutes Intravenous On call to O.R. 05/23/13 1433 05/24/13 1255       Patient was given sequential compression devices, early ambulation, and chemoprophylaxis to prevent DVT.  Patient benefited maximally from hospital stay and there were no complications.    Recent vital signs:  Patient Vitals for the past 24 hrs:  BP Temp Temp src Pulse Resp SpO2  05/26/13 0617 123/68 mmHg 99.3 F (37.4 C) Oral 91 18 93 %  05/25/13 2246 124/72 mmHg 99.9 F  (37.7 C) Oral 110 18 93 %  05/25/13 2019 - - - 86 18 97 %  05/25/13 1534 - - - - - 97 %     Recent laboratory studies:   Recent Labs  05/25/13 0630  WBC 18.7*  HGB 12.1  HCT 37.2  PLT 242  NA 140  K 4.2  CL 99  CO2 25  BUN 16  CREATININE 1.26*  GLUCOSE 155*  CALCIUM 8.7     Discharge Medications:     Medication List    STOP taking these medications       naproxen sodium 220 MG tablet  Commonly known as:  ANAPROX     oxyCODONE-acetaminophen 10-325 MG per tablet  Commonly known as:  PERCOCET  Replaced by:  oxyCODONE-acetaminophen 5-325 MG per tablet     torsemide 20 MG tablet  Commonly known as:  DEMADEX      TAKE these medications       albuterol (2.5 MG/3ML) 0.083% nebulizer solution  Commonly known as:  PROVENTIL  Take 2.5 mg by nebulization 3 (three) times daily.     albuterol 108 (90 BASE) MCG/ACT inhaler  Commonly known as:  PROVENTIL HFA;VENTOLIN HFA  Inhale 2 puffs into the lungs every 6 (six) hours as needed for shortness of breath.     alprazolam 2 MG tablet  Commonly known as:  XANAX  Take 2 mg by mouth 3 (three) times daily as needed for anxiety.     aspirin EC 325 MG tablet  Take 1 tablet (325 mg total) by mouth daily.     bisacodyl 5 MG EC tablet  Commonly known as:  DULCOLAX  Take 1 tablet (5 mg total) by mouth daily as needed for moderate constipation.     esomeprazole 40 MG capsule  Commonly known as:  NEXIUM  Take 40 mg by mouth daily. For heartburn     LEVOTHROID 50 MCG tablet  Generic drug:  levothyroxine  Take 50 mcg by mouth daily before breakfast.     methocarbamol 500 MG tablet  Commonly known as:  ROBAXIN  Take 1 tablet (500 mg total) by mouth 4 (four) times daily.     metoCLOPramide 5 MG tablet  Commonly known as:  REGLAN  Take 5 mg by mouth every other day.     mometasone 50 MCG/ACT nasal spray  Commonly known as:  NASONEX  Place 2 sprays into the nose daily as needed (allergies).     multivitamin with  minerals Tabs tablet  Take 1 tablet by mouth daily.     ondansetron 4 MG tablet  Commonly known as:  ZOFRAN  Take 1 tablet (4 mg total) by mouth every 8 (eight) hours as needed for nausea or vomiting.     oxyCODONE-acetaminophen 5-325 MG per tablet  Commonly known as:  ROXICET  Take 1-2 tablets by mouth every 4 (four) hours as needed.     potassium chloride SA 20 MEQ tablet  Commonly known as:  K-DUR,KLOR-CON  Take 20 mEq by mouth every morning.     simvastatin 10 MG tablet  Commonly known as:  ZOCOR  Take 10 mg by mouth at bedtime.     THERATEARS 0.25 % Soln  Generic drug:  Carboxymethylcellulose Sodium  Apply 1 drop to eye daily.     Vitamin D (Ergocalciferol) 50000 UNITS Caps capsule  Commonly known as:  DRISDOL  Take 100,000 Units by mouth every 7 (seven) days. On Wednesdays        Diagnostic Studies: Nm Myocar Single W/spect W/wall Motion And Ef  05/01/2013   CLINICAL DATA:  68 year old woman with a history of LAD myocardial bridging, hypertension, stroke, hyperlipidemia, and COPD. She is being considered for orthopedic surgery, and this study is requested as part of a preoperative evaluation.  EXAM: MYOCARDIAL IMAGING WITH SPECT (REST AND PHARMACOLOGIC-STRESS)  GATED LEFT VENTRICULAR WALL MOTION STUDY  LEFT VENTRICULAR EJECTION FRACTION  TECHNIQUE: Standard myocardial SPECT imaging was performed after resting intravenous injection of 10 mCi Tc-60m sestamibi. Subsequently, intravenous infusion of Lexiscan was performed under the supervision of the Cardiology staff. At peak effect of the drug, 30 mCi Tc-19m sestamibi was injected intravenously and standard myocardial SPECT imaging was performed. Quantitative gated imaging was also performed to evaluate left ventricular wall motion, and estimate left ventricular ejection fraction.  COMPARISON:  None.  FINDINGS: Resting ECG shows normal sinus rhythm at 63 beats per min with nonspecific T-wave changes and borderline low voltage.  Lexiscan bolus was given in standard fashion. Heart rate increased from 56 beats per min up to 93 beats per min, and blood pressure increased from 139/75 up to 141/84. No chest pain was reported. No diagnostic ST segment abnormalities were seen, and there were no arrhythmias.  Analysis of the overall perfusion data finds breast attenuation.  Tomographic views were obtained using the short axis, vertical long axis, and  horizontal long axis planes. There is a small, mild intensity, apical anteroseptal defect that is fixed and most consistent with soft tissue attenuation. There is normal wall motion in this region.  Gated imaging reveals an EDV of 88, ESV of 29, TID ratio 0.96, and LVEF of 67% with normal wall motion.  IMPRESSION: Low risk Lexiscan Cardiolite. There were no diagnostic ST segment abnormalities. Perfusion imaging is most consistent with breast attenuation, no clear evidence of scar or ischemia as outlined above. LVEF is normal at 67% with normal volumes and normal wall motion.   Electronically Signed   By: Rozann Lesches M.D.   On: 05/01/2013 15:39   Dg Knee Right Port  05/24/2013   CLINICAL DATA:  Total right knee arthroplasty.  EXAM: PORTABLE RIGHT KNEE - 1-2 VIEW  COMPARISON:  10/19/2012  FINDINGS: The femoral and tibial components appear well seated without complicating features. No fracture is identified.  IMPRESSION: Well seated components of a total knee arthroplasty without complicating features.   Electronically Signed   By: Kalman Jewels M.D.   On: 05/24/2013 16:14    Disposition: 01-Home or Self Care  Discharge Orders   Future Orders Complete By Expires   Call MD / Call 911  As directed    Change dressing  As directed    Constipation Prevention  As directed    CPM  As directed    Diet - low sodium heart healthy  As directed    Discharge instructions  As directed    Do not put a pillow under the knee. Place it under the heel.  As directed    Increase activity slowly as  tolerated  As directed    TED hose  As directed       Follow-up Information   Follow up with [facility] F, MD. Schedule an appointment as soon as possible for a visit in 2 weeks.   Specialty:  Orthopedic Surgery   Contact information:   [place] [address] [phone]       Follow up with [facility]. (Someone from [facility] will contact you concerning start date of physical therapy.)    Contact information:   [address] [place] [address] [phone]        Signed: Jerilynn Mages. Doran Stabler 05/26/2013, 8:21 AM

## 2013-05-25 NOTE — Progress Notes (Signed)
Physical Therapy Treatment Patient Details Name: Holly Flores MRN: [id number] DOB: Jan 14, 1946 Today's Date: 05/25/2013    History of Present Illness Pt is a 68 y/o female admitted s/p elective TKA on R.     PT Comments    Pt progressing towards physical therapy goals. Was able to improve ambulation distance and technique, with less reported pain. CPM was donned at end of session and set on 70, with pt education on increasing flexion angle when able on the remote. Will continue to follow.   Follow Up Recommendations  Home health PT;Supervision for mobility/OOB     Equipment Recommendations  Rolling walker with 5" wheels    Recommendations for Other Services       Precautions / Restrictions Precautions Precautions: Knee;Fall Precaution Comments: Discussed towel/foam roll under heel and NO pillow under knee.  Required Braces or Orthoses: Knee Immobilizer - Right Knee Immobilizer - Right: On when out of bed or walking Restrictions Weight Bearing Restrictions: Yes RLE Weight Bearing: Weight bearing as tolerated    Mobility  Bed Mobility Overal bed mobility: Needs Assistance Bed Mobility: Sit to Supine       Sit to supine: Supervision   General bed mobility comments: Pt able to transition from sit>supine without physical assist. VC's for technique as she scoots to [facility].   Transfers Overall transfer level: Needs assistance Equipment used: Rolling walker (2 wheeled) Transfers: Sit to/from Stand Sit to Stand: Min guard         General transfer comment: VC's for walker placement and both hands reaching back for the bed prior to initiating stand>sit.   Ambulation/Gait Ambulation/Gait assistance: Min guard Ambulation Distance (Feet): 75 Feet Assistive device: Rolling walker (2 wheeled) Gait Pattern/deviations: Step-to pattern;Step-through pattern;Decreased stride length;Trunk flexed Gait velocity: Decreased Gait velocity interpretation: Below normal speed for  age/gender General Gait Details: VC's for sequencing and safety awareness with the RW. Specific cueing for increased heel strike, improved posture, and encouraged step-through gait pattern.    Stairs            Wheelchair Mobility    Modified Rankin (Stroke Patients Only)       Balance Overall balance assessment: Needs assistance Sitting-balance support: Feet supported;No upper extremity supported Sitting balance-Leahy Scale: Fair     Standing balance support: Bilateral upper extremity supported Standing balance-Leahy Scale: Fair Standing balance comment: Able to maintain balance for short amounts of time with hands off walker, or 1 extremity off walker.                     Cognition Arousal/Alertness: Awake/alert Behavior During Therapy: WFL for tasks assessed/performed Overall Cognitive Status: Within Functional Limits for tasks assessed                      Exercises Total Joint Exercises Ankle Circles/Pumps: 10 reps Quad Sets: 10 reps Heel Slides: 15 reps Hip ABduction/ADduction: 10 reps Straight Leg Raises: 10 reps Long Arc Quad: 10 reps    General Comments        Pertinent Vitals/Pain Pt reports minimal pain throughout session.     Home Living                      Prior Function            PT Goals (current goals can now be found in the care plan section) Acute Rehab PT Goals Patient Stated Goal: To be more independent at home.  PT  Goal Formulation: With patient Time For Goal Achievement: 06/01/13 Potential to Achieve Goals: Good Progress towards PT goals: Progressing toward goals    Frequency  7X/week    PT Plan Current plan remains appropriate    Co-evaluation             End of Session Equipment Utilized During Treatment: Gait belt Activity Tolerance: Patient tolerated treatment well Patient left: in bed;with call bell/phone within reach;with family/visitor present     Time: 1308-6578 PT Time  Calculation (min): 35 min  Charges:  $Gait Training: 8-22 mins $Therapeutic Exercise: 8-22 mins                    G Codes:      Jolyn Lent May 26, 2013, 4:02 PM  Jolyn Lent, PT, DPT Acute Rehabilitation Services Pager: [phone]

## 2013-05-25 NOTE — Progress Notes (Signed)
Physical Therapy Treatment Patient Details Name: Holly Flores MRN: [id number] DOB: Apr 05, 1945 Today's Date: 05/25/2013    History of Present Illness Pt is 68 y.o female s/p R TKA for DJD.     PT Comments    Pt is s/p TKA resulting in the deficits listed below (see PT Problem List). At the time of PT eval pt was able to perform transfers and ambulation with min guard assist. Pt will benefit from skilled PT to increase their independence and safety with mobility to allow discharge to the venue listed below.    Follow Up Recommendations  Home health PT;Supervision for mobility/OOB     Equipment Recommendations  Rolling walker with 5" wheels    Recommendations for Other Services       Precautions / Restrictions Precautions Precautions: Knee Precaution Comments: Discussed towel/foam roll under heel and NO pillow under knee.  Required Braces or Orthoses: Knee Immobilizer - Right Knee Immobilizer - Right: On when out of bed or walking Restrictions Weight Bearing Restrictions: Yes RLE Weight Bearing: Weight bearing as tolerated    Mobility  Bed Mobility               General bed mobility comments: Pt received sitting up in recliner, finishing self-bath.   Transfers Overall transfer level: Needs assistance Equipment used: Rolling walker (2 wheeled) Transfers: Sit to/from Stand Sit to Stand: Min guard         General transfer comment: VC's for hand placement on seated surface for safety.   Ambulation/Gait Ambulation/Gait assistance: Min guard Ambulation Distance (Feet): 60 Feet Assistive device: Rolling walker (2 wheeled) Gait Pattern/deviations: Step-to pattern;Step-through pattern;Decreased stride length;Trunk flexed Gait velocity: Decreased Gait velocity interpretation: Below normal speed for age/gender General Gait Details: VC's for sequencing and safety awareness with the RW. Specific cueing for increased heel strike, improved posture, and encouraged  step-through gait pattern.    Stairs            Wheelchair Mobility    Modified Rankin (Stroke Patients Only)       Balance Overall balance assessment: Needs assistance Sitting-balance support: Feet supported;No upper extremity supported Sitting balance-Leahy Scale: Fair     Standing balance support: Bilateral upper extremity supported Standing balance-Leahy Scale: Poor Standing balance comment: Pt needs walker support to maintain balance while standing.                     Cognition Arousal/Alertness: Awake/alert Behavior During Therapy: WFL for tasks assessed/performed Overall Cognitive Status: Within Functional Limits for tasks assessed                      Exercises Total Joint Exercises Ankle Circles/Pumps: 10 reps Quad Sets: 10 reps Long Arc Quad: 10 reps Knee Flexion: 10 reps Goniometric ROM: 70 AROM     General Comments        Pertinent Vitals/Pain Pt reports 6/10 pain at rest, and asks for pain medication at end of session. RN notified.     Home Living Family/patient expects to be discharged to:: Private residence Living Arrangements: Spouse/significant other Available Help at Discharge: Family;Available 24 hours/day Type of Home: House Home Access: Stairs to enter Entrance Stairs-Rails: None Home Layout: One level Home Equipment: Walker - standard;Cane - single point;Bedside commode      Prior Function Level of Independence: Needs assistance  Gait / Transfers Assistance Needed: 4-wheeled walker all the time ADL's / Homemaking Assistance Needed: Husband assisted her with dressing and set-up for  bathing     PT Goals (current goals can now be found in the care plan section) Acute Rehab PT Goals Patient Stated Goal: To be more independent at home.  PT Goal Formulation: With patient Time For Goal Achievement: 06/01/13 Potential to Achieve Goals: Good    Frequency  7X/week    PT Plan      Co-evaluation              End of Session Equipment Utilized During Treatment: Gait belt Activity Tolerance: Patient tolerated treatment well Patient left: in chair;with call bell/phone within reach;with family/visitor present     Time: 0921-0954 PT Time Calculation (min): 33 min  Charges:  $Therapeutic Exercise: 8-22 mins $Therapeutic Activity: 8-22 mins                    G Codes:      Jolyn Lent 06-05-2013, 11:15 AM  Jolyn Lent, PT, DPT Acute Rehabilitation Services Pager: [phone]

## 2013-05-25 NOTE — Care Management Note (Signed)
CARE MANAGEMENT NOTE 05/25/2013  Patient:  Holly Flores, Holly Flores   Account Number:  [id number]  Date Initiated:  05/25/2013  Documentation initiated by:  Ricki Miller  Subjective/Objective Assessment:   68 yr old female s/p right total knee arthroplasty.     Action/Plan:   Case Manager spoke with patient concerning home health needs and DME .Preoperatively setup with [facility], no changes. Patient has family support at discharge.   Anticipated DC Date:  05/26/2013   Anticipated DC Plan:  [place]  CM consult      [facility] Choice  HOME HEALTH  DURABLE MEDICAL EQUIPMENT   Choice offered to / List presented to:  C-1 Patient   DME arranged  3-N-1  [place]  CPM      DME agency  [employer]     [place] arranged  HH-2 PT      [place].   Status of service:  Completed, signed off

## 2013-05-26 LAB — CBC
HCT: 32.8 % — ABNORMAL LOW (ref 36.0–46.0)
HEMOGLOBIN: 10.7 g/dL — AB (ref 12.0–15.0)
MCH: 29.8 pg (ref 26.0–34.0)
MCHC: 32.6 g/dL (ref 30.0–36.0)
MCV: 91.4 fL (ref 78.0–100.0)
PLATELETS: 219 10*3/uL (ref 150–400)
RBC: 3.59 MIL/uL — ABNORMAL LOW (ref 3.87–5.11)
RDW: 14.7 % (ref 11.5–15.5)
WBC: 24.9 10*3/uL — ABNORMAL HIGH (ref 4.0–10.5)

## 2013-05-26 LAB — BASIC METABOLIC PANEL
BUN: 24 mg/dL — ABNORMAL HIGH (ref 6–23)
CALCIUM: 8.7 mg/dL (ref 8.4–10.5)
CO2: 26 mEq/L (ref 19–32)
Chloride: 101 mEq/L (ref 96–112)
Creatinine, Ser: 1.38 mg/dL — ABNORMAL HIGH (ref 0.50–1.10)
GFR, EST AFRICAN AMERICAN: 45 mL/min — AB (ref >90)
GFR, EST NON AFRICAN AMERICAN: 39 mL/min — AB (ref >90)
GLUCOSE: 128 mg/dL — AB (ref 70–99)
Potassium: 4.3 mEq/L (ref 3.7–5.3)
SODIUM: 140 meq/L (ref 137–147)

## 2013-05-26 NOTE — Anesthesia Postprocedure Evaluation (Signed)
  Anesthesia Post-op Note  Patient: Holly Flores  Procedure(s) Performed: Procedure(s): TOTAL KNEE ARTHROPLASTY (Right)  Patient discharged with no apparent anesthetic complaints of complications   Last Vitals:  Filed Vitals:   05/26/13 0617  BP: 123/68  Pulse: 91  Temp: 37.4 C  Resp: 18

## 2013-05-26 NOTE — Op Note (Signed)
NAMELENNOX, LEIKAM NO.:  [id number]  MEDICAL RECORD NO.:  [id number]  LOCATION:  5N12C                        FACILITY:  [facility]  PHYSICIAN:  [name], M.D. DATE OF BIRTH:  May 21, 1945  DATE OF PROCEDURE:  05/24/2013 DATE OF DISCHARGE:                              OPERATIVE REPORT   PREOPERATIVE DIAGNOSIS:  Right knee end-stage degenerative arthritis. Varus alignment.  5 to 7 degree flexion contracture.  POSTOPERATIVE DIAGNOSIS:  Right knee end-stage degenerative arthritis. Varus alignment.  5 to 7 degree flexion contracture.  PROCEDURE:  Right knee modified minimally invasive total knee replacement with Stryker triathlon prosthesis.  Soft tissue balancing. Cemented pegged #4 CR femoral component.  Cemented #4 tibial component with a 9 mm CS insert.  Cemented resurfacing 32-mm patellar component.  SURGEON:  [name], M.D.  ASSISTANT:  Eula Listen PA, present throughout the entire case and necessary for timely completion of procedure.  ANESTHESIA:  General.  BLOOD LOSS:  Minimal.  SPECIMENS:  None.  CULTURES:  None.  COMPLICATIONS:  None.  DRESSINGS:  Soft compressive knee immobilizer.  DRAINS:  Hemovac x1.  TOURNIQUET TIME:  1 hour.  DESCRIPTION OF PROCEDURE:  The patient was brought to the operating room, placed on the operating table in supine position.  After adequate anesthesia had been obtained, knee examined.  5 to 7 degrees flexion contracture.  Further flexion 90.  5 degrees of varus.  Tourniquet applied, prepped and draped in usual sterile fashion.  Exsanguinated with elevation of Esmarch.  Tourniquet inflated to 350 mmHg.  Straight incision above the patella down to tibial tubercle.  Because of obesity, this was larger than normal to allow access.  Medial arthrotomy, vastus splitting, preserving quad tendon.  Medial capsule release.  The knee cleared out.  Intramedullary guide distal femur.  An 8-mm resection  5 degrees of valgus.  Using epicondylar axis, the femur was sized, cut, and fitted for a #4 CR component.  Proximal tibial resection below the defect medially.  Multiple drilling of sclerotic bone on the medial side.  Size #4 component.  Patella exposed.  Posterior 10 mm removed. Drilled, sized, and fitted for a 32-mm component.  Debris cleared throughout.  Trials put in place.  With the 9 mm insert, very pleased with alignment, stability, motion.  Tibia was marked for rotation and hand reamed.  All trials removed.  Copious irrigation with pulse irrigating device.  Cement prepared, placed on all components, firmly seated.  Polyethylene attached to tibia, knee reduced.  Patella held with a clamp.  Once cement hardened, the knee was irrigated once again. Soft tissue was injected with Exparel.  Hemovac placed.  Arthrotomy closed with #1 Vicryl.  Skin and subcutaneous tissue with subcutaneous subcuticular closure.  Margins were injected with Marcaine.  Sterile compressive dressing applied.  Tourniquet deflated and removed.  Knee immobilizer applied.  Anesthesia reversed.  Brought to the recovery room.  Tolerated the surgery well with no complications.     [name], M.D.     DFM/MEDQ  D:  05/25/2013  T:  05/25/2013  Job:  [id number]

## 2013-05-26 NOTE — Progress Notes (Signed)
Physical Therapy Treatment Patient Details Name: Holly Flores MRN: [id number] DOB: July 26, 1945 Today's Date: 05/26/2013    History of Present Illness Pt is a 68 y/o female admitted s/p elective TKA on R.     PT Comments    Pt progressing towards physical therapy goals. Was able to negotiate 4 steps ascending and descending utilizing backwards technique with the walker. Handouts given for sequencing on the steps and for HEP. Pt anticipates d/c home today. Will continue to follow until d/c.   Follow Up Recommendations  Home health PT;Supervision for mobility/OOB     Equipment Recommendations  Rolling walker with 5" wheels    Recommendations for Other Services       Precautions / Restrictions Precautions Precautions: Knee;Fall Precaution Comments: Discussed towel/foam roll under heel and NO pillow under knee.  Required Braces or Orthoses: Knee Immobilizer - Right Knee Immobilizer - Right: On when out of bed or walking Restrictions Weight Bearing Restrictions: Yes RLE Weight Bearing: Weight bearing as tolerated    Mobility  Bed Mobility               General bed mobility comments: Pt received sitting up in recliner.   Transfers Overall transfer level: Needs assistance Equipment used: Rolling walker (2 wheeled) Transfers: Sit to/from Stand Sit to Stand: Supervision         General transfer comment: Pt demonstrates proper hand placement and safety awareness.  Ambulation/Gait Ambulation/Gait assistance: Supervision Ambulation Distance (Feet): 100 Feet Assistive device: Rolling walker (2 wheeled) Gait Pattern/deviations: Step-to pattern;Step-through pattern;Decreased stride length;Trunk flexed Gait velocity: Decreased Gait velocity interpretation: Below normal speed for age/gender General Gait Details: VC's for sequencing and safety awareness with the RW. Specific cueing for increased heel strike, improved posture, and encouraged step-through gait pattern.     Stairs Stairs: Yes Stairs assistance: Min guard Stair Management: No rails;Backwards;With walker Number of Stairs: 4 General stair comments: VC's for sequencing and safety awareness.  Wheelchair Mobility    Modified Rankin (Stroke Patients Only)       Balance Overall balance assessment: Needs assistance Sitting-balance support: Feet supported;No upper extremity supported Sitting balance-Leahy Scale: Good     Standing balance support: No upper extremity supported;During functional activity Standing balance-Leahy Scale: Fair                      Cognition Arousal/Alertness: Awake/alert Behavior During Therapy: WFL for tasks assessed/performed Overall Cognitive Status: Within Functional Limits for tasks assessed                      Exercises Total Joint Exercises Ankle Circles/Pumps: 10 reps Quad Sets: 10 reps Towel Squeeze: 10 reps Short Arc Quad: 10 reps Heel Slides: 10 reps Hip ABduction/ADduction: 10 reps Straight Leg Raises: 10 reps Knee Flexion: 10 reps Goniometric ROM: 93 AAROM    General Comments General comments (skin integrity, edema, etc.): Issued HEP and discussed technique, reps, sets, frequency      Pertinent Vitals/Pain Pt reports minimal pain throughout session.     Home Living                      Prior Function            PT Goals (current goals can now be found in the care plan section) Acute Rehab PT Goals Patient Stated Goal: To be more independent at home.  PT Goal Formulation: With patient Time For Goal Achievement: 06/01/13 Potential to Achieve Goals:  Good Progress towards PT goals: Progressing toward goals    Frequency  7X/week    PT Plan Current plan remains appropriate    Co-evaluation             End of Session Equipment Utilized During Treatment: Gait belt Activity Tolerance: Patient tolerated treatment well Patient left: in bed;with call bell/phone within reach;with  family/visitor present     Time: 7824-2353 PT Time Calculation (min): 42 min  Charges:  $Gait Training: 23-37 mins $Therapeutic Exercise: 8-22 mins                    G Codes:      Jolyn Lent 2013/06/02, 11:08 AM  Jolyn Lent, PT, DPT Acute Rehabilitation Services Pager: [phone]

## 2013-05-26 NOTE — Progress Notes (Signed)
Subjective: 2 Days Post-Op Procedure(s) (LRB): TOTAL KNEE ARTHROPLASTY (Right) Patient reports pain as 1 on 0-10 scale.  Holly Flores is continuing to do great.  Doing well with PT.  No nausea/vomiting, lightheadedness/dizziness.  Positive flatus, but no bm as of yet.    Objective: Vital signs in last 24 hours: Temp:  [99.3 F (37.4 C)-99.9 F (37.7 C)] 99.3 F (37.4 C) (04/10 0617) Pulse Rate:  [86-110] 91 (04/10 0617) Resp:  [18] 18 (04/10 0617) BP: (123-124)/(68-72) 123/68 mmHg (04/10 0617) SpO2:  [93 %-97 %] 93 % (04/10 0617)  Intake/Output from previous day: 04/09 0701 - 04/10 0700 In: 240 [I.V.:240] Out: -  Intake/Output this shift:     Recent Labs  05/25/13 0630  HGB 12.1    Recent Labs  05/25/13 0630  WBC 18.7*  RBC 4.02  HCT 37.2  PLT 242    Recent Labs  05/25/13 0630  NA 140  K 4.2  CL 99  CO2 25  BUN 16  CREATININE 1.26*  GLUCOSE 155*  CALCIUM 8.7   No results found for this basename: LABPT, INR,  in the last 72 hours  Neurologically intact Neurovascular intact Sensation intact distally Intact pulses distally Dorsiflexion/Plantar flexion intact Incision: scant drainage No cellulitis present Compartment soft Dressing changed by me today  Assessment/Plan: 2 Days Post-Op Procedure(s) (LRB): TOTAL KNEE ARTHROPLASTY (Right) Advance diet Up with therapy D/C IV fluids Discharge home with home health  M. Doran Stabler 05/26/2013, 8:19 AM

## 2013-06-13 ENCOUNTER — Ambulatory Visit: Payer: Medicare Other | Attending: Orthopedic Surgery | Admitting: Physical Therapy

## 2013-06-13 DIAGNOSIS — M25669 Stiffness of unspecified knee, not elsewhere classified: Secondary | ICD-10-CM | POA: Diagnosis not present

## 2013-06-13 DIAGNOSIS — IMO0001 Reserved for inherently not codable concepts without codable children: Secondary | ICD-10-CM | POA: Diagnosis not present

## 2013-06-13 DIAGNOSIS — Z96659 Presence of unspecified artificial knee joint: Secondary | ICD-10-CM | POA: Diagnosis not present

## 2013-06-13 DIAGNOSIS — M25569 Pain in unspecified knee: Secondary | ICD-10-CM | POA: Insufficient documentation

## 2013-06-13 DIAGNOSIS — R5381 Other malaise: Secondary | ICD-10-CM | POA: Insufficient documentation

## 2013-06-15 ENCOUNTER — Ambulatory Visit: Payer: Medicare Other | Admitting: Physical Therapy

## 2013-06-15 DIAGNOSIS — IMO0001 Reserved for inherently not codable concepts without codable children: Secondary | ICD-10-CM | POA: Diagnosis not present

## 2013-06-19 ENCOUNTER — Ambulatory Visit: Payer: Medicare Other | Attending: Orthopedic Surgery | Admitting: Physical Therapy

## 2013-06-19 DIAGNOSIS — IMO0001 Reserved for inherently not codable concepts without codable children: Secondary | ICD-10-CM | POA: Diagnosis not present

## 2013-06-19 DIAGNOSIS — M25569 Pain in unspecified knee: Secondary | ICD-10-CM | POA: Diagnosis not present

## 2013-06-19 DIAGNOSIS — Z96659 Presence of unspecified artificial knee joint: Secondary | ICD-10-CM | POA: Insufficient documentation

## 2013-06-19 DIAGNOSIS — R5381 Other malaise: Secondary | ICD-10-CM | POA: Diagnosis not present

## 2013-06-19 DIAGNOSIS — M25669 Stiffness of unspecified knee, not elsewhere classified: Secondary | ICD-10-CM | POA: Insufficient documentation

## 2013-06-21 ENCOUNTER — Ambulatory Visit: Payer: Medicare Other | Admitting: Physical Therapy

## 2013-06-21 DIAGNOSIS — IMO0001 Reserved for inherently not codable concepts without codable children: Secondary | ICD-10-CM | POA: Diagnosis not present

## 2013-06-23 ENCOUNTER — Ambulatory Visit: Payer: Medicare Other | Admitting: Physical Therapy

## 2013-06-23 DIAGNOSIS — IMO0001 Reserved for inherently not codable concepts without codable children: Secondary | ICD-10-CM | POA: Diagnosis not present

## 2013-06-27 ENCOUNTER — Ambulatory Visit: Payer: Medicare Other | Admitting: *Deleted

## 2013-06-27 DIAGNOSIS — IMO0001 Reserved for inherently not codable concepts without codable children: Secondary | ICD-10-CM | POA: Diagnosis not present

## 2013-06-29 ENCOUNTER — Ambulatory Visit: Payer: Medicare Other | Admitting: Physical Therapy

## 2013-06-29 DIAGNOSIS — IMO0001 Reserved for inherently not codable concepts without codable children: Secondary | ICD-10-CM | POA: Diagnosis not present

## 2013-07-04 ENCOUNTER — Ambulatory Visit: Payer: Medicare Other | Admitting: *Deleted

## 2013-07-04 DIAGNOSIS — IMO0001 Reserved for inherently not codable concepts without codable children: Secondary | ICD-10-CM | POA: Diagnosis not present

## 2013-07-06 ENCOUNTER — Ambulatory Visit: Payer: Medicare Other | Admitting: *Deleted

## 2013-07-06 DIAGNOSIS — IMO0001 Reserved for inherently not codable concepts without codable children: Secondary | ICD-10-CM | POA: Diagnosis not present

## 2014-04-17 ENCOUNTER — Encounter: Payer: Self-pay | Admitting: Internal Medicine

## 2014-05-14 ENCOUNTER — Encounter: Payer: Self-pay | Admitting: Gastroenterology

## 2014-05-14 ENCOUNTER — Other Ambulatory Visit: Payer: Self-pay

## 2014-05-14 ENCOUNTER — Ambulatory Visit (INDEPENDENT_AMBULATORY_CARE_PROVIDER_SITE_OTHER): Payer: Medicare Other | Admitting: Gastroenterology

## 2014-05-14 VITALS — BP 137/85 | HR 87 | Temp 97.5°F | Ht 61.0 in | Wt 230.0 lb

## 2014-05-14 DIAGNOSIS — Z860100 Personal history of colon polyps, unspecified: Secondary | ICD-10-CM

## 2014-05-14 HISTORY — DX: Personal history of colon polyps, unspecified: Z86.0100

## 2014-05-14 HISTORY — DX: Personal history of colonic polyps: Z86.010

## 2014-05-14 MED ORDER — PEG-KCL-NACL-NASULF-NA ASC-C 100 G PO SOLR: 1.0000 | ORAL | Status: DC

## 2014-05-14 NOTE — Progress Notes (Signed)
Primary Care Physician:  Octavio Graves, DO Primary Gastroenterologist:  Dr. Gala Romney   Chief Complaint  Patient presents with  . set up TCS    HPI:   Holly Flores is a 69 y.o. female presenting today to schedule surveillance colonoscopy. History of adenomatous polyps in 2011. No hematochezia. No constipation or diarrhea. No abdominal pain. Nexium for GERD. No dysphagia. Overall no GI issues currently.   Past Medical History  Diagnosis Date  . CHF (congestive heart failure)   . Arthritis   . Stroke   . Hypertension   . GERD (gastroesophageal reflux disease)   . COPD (chronic obstructive pulmonary disease)   . Shortness of breath   . Anxiety   . Depression   . Hyperlipidemia   . Goiter   . Kidney cysts     left side  . Sleep apnea   . Stroke     OCULAR  LEFT  EYE    LAST YR.    Past Surgical History  Procedure Laterality Date  . Back surgery    . Knee surgery    . Cholecystectomy    . Tonsillectomy    . Cesarean section    . Cervical spine surgery    . Cardiac catheterization    . Abdominal hysterectomy    . Breast biopsy      left-non cancerous  . Cataract extraction w/phaco  09/21/2011    Procedure: CATARACT EXTRACTION PHACO AND INTRAOCULAR LENS PLACEMENT (IOC);  Surgeon: Williams Che, MD;  Location: [facility];  Service: Ophthalmology;  Laterality: Left;  CDE:9.78  . Total knee arthroplasty Right 05/24/2013    DR MURPHY  . Total knee arthroplasty Right 05/24/2013    Procedure: TOTAL KNEE ARTHROPLASTY;  Surgeon: [name], MD;  Location: [facility];  Service: Orthopedics;  Laterality: Right;  . Esophagogastroduodenoscopy  2006    Dr. Gala Romney: Subtle Schatzkis ring, otherwise normal upper GI tract, aside from a small pyloric channell erosion, status post dilation as described above.   . Colonoscopy  2006    RMR: 1. Internal hemorroids, otherwise normal rectum. 2. Pedunculated polyp at 35 cm. reomved with snare. The remainder of teh colonic mucosa appeared  normal.   . Colonoscopy  2011    Dr. Gala Romney: multiple ascending colon polyps and rectal polyp, adenomatous    Current Outpatient Prescriptions  Medication Sig Dispense Refill  . albuterol (PROVENTIL HFA;VENTOLIN HFA) 108 (90 BASE) MCG/ACT inhaler Inhale 2 puffs into the lungs every 6 (six) hours as needed for shortness of breath.     Marland Kitchen albuterol (PROVENTIL) (2.5 MG/3ML) 0.083% nebulizer solution Take 2.5 mg by nebulization 3 (three) times daily.    Marland Kitchen alprazolam (XANAX) 2 MG tablet Take 2 mg by mouth 3 (three) times daily as needed for anxiety.     Marland Kitchen aspirin EC 325 MG tablet Take 1 tablet (325 mg total) by mouth daily. 30 tablet 0  . Carboxymethylcellulose Sodium (THERATEARS) 0.25 % SOLN Apply 1 drop to eye daily.    Marland Kitchen esomeprazole (NEXIUM) 40 MG capsule Take 40 mg by mouth daily. For heartburn    . levothyroxine (LEVOTHROID) 50 MCG tablet Take 50 mcg by mouth daily before breakfast.    . methocarbamol (ROBAXIN) 500 MG tablet Take 1 tablet (500 mg total) by mouth 4 (four) times daily. 90 tablet 0  . metoCLOPramide (REGLAN) 5 MG tablet Take 5 mg by mouth every other day. Takes only rarely    . mometasone (NASONEX)  50 MCG/ACT nasal spray Place 2 sprays into the nose daily as needed (allergies).     . Multiple Vitamin (MULTIVITAMIN WITH MINERALS) TABS Take 1 tablet by mouth daily.    . ondansetron (ZOFRAN) 4 MG tablet Take 1 tablet (4 mg total) by mouth every 8 (eight) hours as needed for nausea or vomiting. 40 tablet 0  . oxyCODONE-acetaminophen (ROXICET) 5-325 MG per tablet Take 1-2 tablets by mouth every 4 (four) hours as needed. 60 tablet 0  . torsemide (DEMADEX) 20 MG tablet Take 40 mg by mouth daily.    . Vitamin D, Ergocalciferol, (DRISDOL) 50000 UNITS CAPS Take 100,000 Units by mouth every 7 (seven) days. On Wednesdays    . peg 3350 powder (MOVIPREP) 100 G SOLR Take 1 kit (200 g total) by mouth as directed. 1 kit 0  . simvastatin (ZOCOR) 10 MG tablet Take 10 mg by mouth daily.     No  current facility-administered medications for this visit.    Allergies as of 05/14/2014 - Review Complete 05/14/2014  Allergen Reaction Noted  . Sulfa antibiotics Swelling 08/12/2011  . Penicillins Hives, Itching, and Rash 08/12/2011    Family History  Problem Relation Age of Onset  . Colon cancer Neg Hx     History   Social History  . Marital Status: Married    Spouse Name: N/A  . Number of Children: N/A  . Years of Education: N/A   Occupational History  . Not on file.   Social History Main Topics  . Smoking status: Former Smoker -- 5 years    Types: Cigarettes    Quit date: 04/21/1998  . Smokeless tobacco: Never Used  . Alcohol Use: 1.2 oz/week    2 Glasses of wine per week  . Drug Use: No  . Sexual Activity: Yes    Birth Control/ Protection: Post-menopausal   Other Topics Concern  . Not on file   Social History Narrative    Review of Systems: Gen: Denies any fever, chills, fatigue, weight loss, lack of appetite.  CV: Denies chest pain, heart palpitations, peripheral edema, syncope.  Resp: Denies shortness of breath at rest or with exertion. Denies wheezing or cough.  GI: see HPI GU : Denies urinary burning, urinary frequency, urinary hesitancy MS: +arthritis Derm: Denies rash, itching, dry skin Psych: Denies depression, anxiety, memory loss, and confusion Heme: Denies bruising, bleeding, and enlarged lymph nodes.  Physical Exam: BP 137/85 mmHg  Pulse 87  Temp(Src) 97.5 F (36.4 C)  Ht _0  (1.549 m)  Wt 230 lb (104.327 kg)  BMI 43.48 kg/m2 General:   Alert and oriented. Pleasant and cooperative. Well-nourished and well-developed.  Head:  Normocephalic and atraumatic. Eyes:  Without icterus, sclera clear and conjunctiva pink.  Ears:  Normal auditory acuity. Nose:  No deformity, discharge,  or lesions. Mouth:  No deformity or lesions, oral mucosa pink.  Lungs:  Clear to auscultation bilaterally. No wheezes, rales, or rhonchi. No distress.  Heart:   S1, S2 present without murmurs appreciated.  Abdomen:  +BS, soft, non-tender and non-distended. No HSM noted. No guarding or rebound. No masses appreciated.  Rectal:  Deferred  Msk:  Symmetrical without gross deformities. Normal posture. Extremities:  Without clubbing or edema. Neurologic:  Alert and  oriented x4;  grossly normal neurologically. Skin:  Intact without significant lesions or rashes. Psych:  Alert and cooperative. Normal mood and affect.

## 2014-05-14 NOTE — Patient Instructions (Signed)
We have set you up for a colonoscopy with Dr. Gala Romney.  Further recommendations to follow!

## 2014-05-24 ENCOUNTER — Encounter: Payer: Self-pay | Admitting: Gastroenterology

## 2014-05-24 NOTE — Assessment & Plan Note (Signed)
69 year old female due for surveillance colonoscopy secondary to history of colon polyps, without any concerning lower or upper GI symptoms. Proceed with colonoscopy now.   Proceed with TCS with Dr. Gala Romney in near future: the risks, benefits, and alternatives have been discussed with the patient in detail. The patient states understanding and desires to proceed.

## 2014-05-28 NOTE — Progress Notes (Signed)
cc'ed to pcp °

## 2014-05-31 ENCOUNTER — Encounter (HOSPITAL_COMMUNITY): Payer: Self-pay | Admitting: *Deleted

## 2014-05-31 ENCOUNTER — Ambulatory Visit (HOSPITAL_COMMUNITY)
Admission: RE | Admit: 2014-05-31 | Discharge: 2014-05-31 | Disposition: A | Discharge status: Home / Self Care | Payer: Medicare Other | Source: Ambulatory Visit | Attending: Internal Medicine | Admitting: Internal Medicine

## 2014-05-31 ENCOUNTER — Encounter (HOSPITAL_COMMUNITY)
Admission: RE | Disposition: A | Discharge status: Home / Self Care | Payer: Self-pay | Source: Ambulatory Visit | Attending: Internal Medicine

## 2014-05-31 DIAGNOSIS — D123 Benign neoplasm of transverse colon: Secondary | ICD-10-CM | POA: Diagnosis not present

## 2014-05-31 DIAGNOSIS — G4730 Sleep apnea, unspecified: Secondary | ICD-10-CM | POA: Diagnosis not present

## 2014-05-31 DIAGNOSIS — D120 Benign neoplasm of cecum: Secondary | ICD-10-CM | POA: Diagnosis not present

## 2014-05-31 DIAGNOSIS — Z7951 Long term (current) use of inhaled steroids: Secondary | ICD-10-CM | POA: Insufficient documentation

## 2014-05-31 DIAGNOSIS — F419 Anxiety disorder, unspecified: Secondary | ICD-10-CM | POA: Insufficient documentation

## 2014-05-31 DIAGNOSIS — Z79899 Other long term (current) drug therapy: Secondary | ICD-10-CM | POA: Diagnosis not present

## 2014-05-31 DIAGNOSIS — Z96651 Presence of right artificial knee joint: Secondary | ICD-10-CM | POA: Diagnosis not present

## 2014-05-31 DIAGNOSIS — I509 Heart failure, unspecified: Secondary | ICD-10-CM | POA: Diagnosis not present

## 2014-05-31 DIAGNOSIS — Z09 Encounter for follow-up examination after completed treatment for conditions other than malignant neoplasm: Secondary | ICD-10-CM | POA: Diagnosis present

## 2014-05-31 DIAGNOSIS — Z8673 Personal history of transient ischemic attack (TIA), and cerebral infarction without residual deficits: Secondary | ICD-10-CM | POA: Insufficient documentation

## 2014-05-31 DIAGNOSIS — D175 Benign lipomatous neoplasm of intra-abdominal organs: Secondary | ICD-10-CM | POA: Insufficient documentation

## 2014-05-31 DIAGNOSIS — M1990 Unspecified osteoarthritis, unspecified site: Secondary | ICD-10-CM | POA: Diagnosis not present

## 2014-05-31 DIAGNOSIS — E049 Nontoxic goiter, unspecified: Secondary | ICD-10-CM | POA: Diagnosis not present

## 2014-05-31 DIAGNOSIS — E785 Hyperlipidemia, unspecified: Secondary | ICD-10-CM | POA: Insufficient documentation

## 2014-05-31 DIAGNOSIS — J449 Chronic obstructive pulmonary disease, unspecified: Secondary | ICD-10-CM | POA: Insufficient documentation

## 2014-05-31 DIAGNOSIS — Z79891 Long term (current) use of opiate analgesic: Secondary | ICD-10-CM | POA: Diagnosis not present

## 2014-05-31 DIAGNOSIS — Z9049 Acquired absence of other specified parts of digestive tract: Secondary | ICD-10-CM | POA: Diagnosis not present

## 2014-05-31 DIAGNOSIS — K219 Gastro-esophageal reflux disease without esophagitis: Secondary | ICD-10-CM | POA: Insufficient documentation

## 2014-05-31 DIAGNOSIS — Z7982 Long term (current) use of aspirin: Secondary | ICD-10-CM | POA: Diagnosis not present

## 2014-05-31 DIAGNOSIS — D1779 Benign lipomatous neoplasm of other sites: Secondary | ICD-10-CM | POA: Diagnosis not present

## 2014-05-31 DIAGNOSIS — Z86010 Personal history of colonic polyps: Secondary | ICD-10-CM | POA: Insufficient documentation

## 2014-05-31 DIAGNOSIS — I10 Essential (primary) hypertension: Secondary | ICD-10-CM | POA: Insufficient documentation

## 2014-05-31 HISTORY — DX: Hypothyroidism, unspecified: E03.9

## 2014-05-31 HISTORY — PX: COLONOSCOPY: SHX5424

## 2014-05-31 SURGERY — COLONOSCOPY
Anesthesia: Moderate Sedation

## 2014-05-31 MED ORDER — MIDAZOLAM HCL 5 MG/5ML IJ SOLN
INTRAMUSCULAR | Status: DC | PRN
  Administered 2014-05-31: 2 mg via INTRAVENOUS
  Administered 2014-05-31: 1 mg via INTRAVENOUS
  Administered 2014-05-31: 2 mg via INTRAVENOUS

## 2014-05-31 MED ORDER — MIDAZOLAM HCL 5 MG/5ML IJ SOLN
INTRAMUSCULAR | Status: AC
  Filled 2014-05-31: qty 10

## 2014-05-31 MED ORDER — SODIUM CHLORIDE 0.9 % IV SOLN
INTRAVENOUS | Status: DC
Start: 2014-05-31 — End: 2014-05-31
  Administered 2014-05-31: 1000 mL via INTRAVENOUS

## 2014-05-31 MED ORDER — STERILE WATER FOR IRRIGATION IR SOLN
Status: DC | PRN
  Administered 2014-05-31: 13:00:00

## 2014-05-31 MED ORDER — ONDANSETRON HCL 4 MG/2ML IJ SOLN
INTRAMUSCULAR | Status: DC | PRN
  Administered 2014-05-31: 4 mg via INTRAVENOUS

## 2014-05-31 MED ORDER — MEPERIDINE HCL 100 MG/ML IJ SOLN
INTRAMUSCULAR | Status: AC
  Filled 2014-05-31: qty 2

## 2014-05-31 MED ORDER — ONDANSETRON HCL 4 MG/2ML IJ SOLN
INTRAMUSCULAR | Status: AC
  Filled 2014-05-31: qty 2

## 2014-05-31 MED ORDER — MEPERIDINE HCL 100 MG/ML IJ SOLN
INTRAMUSCULAR | Status: DC | PRN
  Administered 2014-05-31 (×2): 50 mg via INTRAVENOUS
  Administered 2014-05-31: 25 mg via INTRAVENOUS

## 2014-05-31 NOTE — Discharge Instructions (Signed)
°Colonoscopy °Discharge Instructions ° °Read the instructions outlined below and refer to this sheet in the next few weeks. These discharge instructions provide you with general information on caring for yourself after you leave the hospital. Your doctor may also give you specific instructions. While your treatment has been planned according to the most current medical practices available, unavoidable complications occasionally occur. If you have any problems or questions after discharge, call Dr. Rourk at [phone]. °ACTIVITY °· You may resume your regular activity, but move at a slower pace for the next 24 hours.  °· Take frequent rest periods for the next 24 hours.  °· Walking will help get rid of the air and reduce the bloated feeling in your belly (abdomen).  °· No driving for 24 hours (because of the medicine (anesthesia) used during the test).   °· Do not sign any important legal documents or operate any machinery for 24 hours (because of the anesthesia used during the test).  °NUTRITION °· Drink plenty of fluids.  °· You may resume your normal diet as instructed by your doctor.  °· Begin with a light meal and progress to your normal diet. Heavy or fried foods are harder to digest and may make you feel sick to your stomach (nauseated).  °· Avoid alcoholic beverages for 24 hours or as instructed.  °MEDICATIONS °· You may resume your normal medications unless your doctor tells you otherwise.  °WHAT YOU CAN EXPECT TODAY °· Some feelings of bloating in the abdomen.  °· Passage of more gas than usual.  °· Spotting of blood in your stool or on the toilet paper.  °IF YOU HAD POLYPS REMOVED DURING THE COLONOSCOPY: °· No aspirin products for 7 days or as instructed.  °· No alcohol for 7 days or as instructed.  °· Eat a soft diet for the next 24 hours.  °FINDING OUT THE RESULTS OF YOUR TEST °Not all test results are available during your visit. If your test results are not back during the visit, make an appointment  with your caregiver to find out the results. Do not assume everything is normal if you have not heard from your caregiver or the medical facility. It is important for you to follow up on all of your test results.  °SEEK IMMEDIATE MEDICAL ATTENTION IF: °· You have more than a spotting of blood in your stool.  °· Your belly is swollen (abdominal distention).  °· You are nauseated or vomiting.  °· You have a temperature over 101.  °· You have abdominal pain or discomfort that is severe or gets worse throughout the day.  ° ° °Polyp information provided ° °Further recommendations to follow pending review of pathology report ° °Colon Polyps °Polyps are lumps of extra tissue growing inside the body. Polyps can grow in the large intestine (colon). Most colon polyps are noncancerous (benign). However, some colon polyps can become cancerous over time. Polyps that are larger than a pea may be harmful. To be safe, caregivers remove and test all polyps. °CAUSES  °Polyps form when mutations in the genes cause your cells to grow and divide even though no more tissue is needed. °RISK FACTORS °There are a number of risk factors that can increase your chances of getting colon polyps. They include: °· Being older than 50 years. °· Family history of colon polyps or colon cancer. °· Long-term colon diseases, such as colitis or Crohn disease. °· Being overweight. °· Smoking. °· Being inactive. °· Drinking too much alcohol. °SYMPTOMS  °  Most small polyps do not cause symptoms. If symptoms are present, they may include: °· Blood in the stool. The stool may look dark red or black. °· Constipation or diarrhea that lasts longer than 1 week. °DIAGNOSIS °People often do not know they have polyps until their caregiver finds them during a regular checkup. Your caregiver can use 4 tests to check for polyps: °· Digital rectal exam. The caregiver wears gloves and feels inside the rectum. This test would find polyps only in the rectum. °· Barium  enema. The caregiver puts a liquid called barium into your rectum before taking X-rays of your colon. Barium makes your colon look white. Polyps are dark, so they are easy to see in the X-ray pictures. °· Sigmoidoscopy. A thin, flexible tube (sigmoidoscope) is placed into your rectum. The sigmoidoscope has a light and tiny camera in it. The caregiver uses the sigmoidoscope to look at the last third of your colon. °· Colonoscopy. This test is like sigmoidoscopy, but the caregiver looks at the entire colon. This is the most common method for finding and removing polyps. °TREATMENT  °Any polyps will be removed during a sigmoidoscopy or colonoscopy. The polyps are then tested for cancer. °PREVENTION  °To help lower your risk of getting more colon polyps: °· Eat plenty of fruits and vegetables. Avoid eating fatty foods. °· Do not smoke. °· Avoid drinking alcohol. °· Exercise every day. °· Lose weight if recommended by your caregiver. °· Eat plenty of calcium and folate. Foods that are rich in calcium include milk, cheese, and broccoli. Foods that are rich in folate include chickpeas, kidney beans, and spinach. °HOME CARE INSTRUCTIONS °Keep all follow-up appointments as directed by your caregiver. You may need periodic exams to check for polyps. °SEEK MEDICAL CARE IF: °You notice bleeding during a bowel movement. °Document Released: 10/30/2003 Document Revised: 04/27/2011 Document Reviewed: 04/14/2011 °[employer]® Patient Information ©2015 [employer]. This information is not intended to replace advice given to you by your health care provider. Make sure you discuss any questions you have with your health care provider. ° °

## 2014-05-31 NOTE — Interval H&P Note (Signed)
History and Physical Interval Note:  05/31/2014 1:24 PM  Holly Flores  has presented today for surgery, with the diagnosis of history of colon polyps  The various methods of treatment have been discussed with the patient and family. After consideration of risks, benefits and other options for treatment, the patient has consented to  Procedure(s) with comments: COLONOSCOPY (N/A) - 130 as a surgical intervention .  The patient's history has been reviewed, patient examined, no change in status, stable for surgery.  I have reviewed the patient's chart and labs.  Questions were answered to the patient's satisfaction.     Holly Flores   No change. Surveillance colonoscopy per plan.  The risks, benefits, limitations, alternatives and imponderables have been reviewed with the patient. Questions have been answered. All parties are agreeable.

## 2014-05-31 NOTE — Op Note (Signed)
[facility] [address] [place], [address]   COLONOSCOPY PROCEDURE REPORT  PATIENT: Holly Flores, Holly Flores  MR#: [id number] BIRTHDATE: November 18, 1945 , 68  yrs. old GENDER: female ENDOSCOPIST: R.  Garfield Cornea, MD FACP [facility] REFERRED TK:[id number] Melina Copa PROCEDURE DATE:  2014/06/09 PROCEDURE:   Colonoscopy, surveillance , Colonoscopy with biopsy, and Colonoscopy with snare polypectomy INDICATIONS:History of multiple colonic adenomas. MEDICATIONS: Versed 5 mg IV and Demerol 125 mg IV in divided doses. Zofran 4 mg IV. ASA CLASS:       Class II  CONSENT: The risks, benefits, alternatives and imponderables including but not limited to bleeding, perforation as well as the possibility of a missed lesion have been reviewed.  The potential for biopsy, lesion removal, etc. have also been discussed. Questions have been answered.  All parties agreeable.  Please see the history and physical in the medical record for more information.  DESCRIPTION OF PROCEDURE:   After the risks benefits and alternatives of the procedure were thoroughly explained, informed consent was obtained.  The digital rectal exam revealed no abnormalities of the rectum.   The EC-[id number] ([id number])  endoscope was introduced through the anus and advanced to the cecum, which was identified by both the appendix and ileocecal valve. No adverse events experienced.   The quality of the prep was adequate  The instrument was then slowly withdrawn as the colon was fully examined.      COLON FINDINGS: Normal-appearing rectal mucosa.  (1) 5 x 7 mm carpet polyp in the base of the cecum; (3) diminutive adjacent cecal polyps (1) diminutive polyp at the hepatic flexure and (2) diminutive polyps in the mid transverse segment; otherwise, the remainder of the colonic mucosa appeared normal aside from a 1.5 cm yellowish submucosal mass in the transverse colon (positive pillow sign)  consistent with a lipoma.  The  above-mentioned polyps were hot snare removed and cold biopsy removed, respectively.  Retroflexion was performed. .  Withdrawal time=23 minutes 0 seconds.  The scope was withdrawn and the procedure completed. COMPLICATIONS: There were no immediate complications.  ENDOSCOPIC IMPRESSION: Multiple colonic polyps?"removed as described above. 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  eSigned:  R. Garfield Cornea, MD Rosalita Chessman [facility] 06-09-2014 2:27 PM   cc:  CPT CODES: ICD CODES:  The ICD and CPT codes recommended by this software are interpretations from the data that the clinical staff has captured with the software.  The verification of the translation of this report to the ICD and CPT codes and modifiers is the sole responsibility of the health care institution and practicing physician where this report was generated.  [employer] will not be held responsible for the validity of the ICD and CPT codes included on this report.  AMA assumes no liability for data contained or not contained herein. CPT is a Designer, television/film set of the [employer].  PATIENT NAME:  Holly Flores, Holly Flores MR#: [id number]

## 2014-05-31 NOTE — H&P (View-Only) (Signed)
    Primary Care Physician:  BUTLER, CYNTHIA, DO Primary Gastroenterologist:  Dr. Rourk   Chief Complaint  Patient presents with  . set up TCS    HPI:   Holly Flores is a 68 y.o. female presenting today to schedule surveillance colonoscopy. History of adenomatous polyps in 2011. No hematochezia. No constipation or diarrhea. No abdominal pain. Nexium for GERD. No dysphagia. Overall no GI issues currently.   Past Medical History  Diagnosis Date  . CHF (congestive heart failure)   . Arthritis   . Stroke   . Hypertension   . GERD (gastroesophageal reflux disease)   . COPD (chronic obstructive pulmonary disease)   . Shortness of breath   . Anxiety   . Depression   . Hyperlipidemia   . Goiter   . Kidney cysts     left side  . Sleep apnea   . Stroke     OCULAR  LEFT  EYE    LAST YR.    Past Surgical History  Procedure Laterality Date  . Back surgery    . Knee surgery    . Cholecystectomy    . Tonsillectomy    . Cesarean section    . Cervical spine surgery    . Cardiac catheterization    . Abdominal hysterectomy    . Breast biopsy      left-non cancerous  . Cataract extraction w/phaco  09/21/2011    Procedure: CATARACT EXTRACTION PHACO AND INTRAOCULAR LENS PLACEMENT (IOC);  Surgeon: Carroll F Haines, MD;  Location: [facility];  Service: Ophthalmology;  Laterality: Left;  CDE:9.78  . Total knee arthroplasty Right 05/24/2013    DR MURPHY  . Total knee arthroplasty Right 05/24/2013    Procedure: TOTAL KNEE ARTHROPLASTY;  Surgeon: Daniel F Murphy, MD;  Location: [facility] OR;  Service: Orthopedics;  Laterality: Right;  . Esophagogastroduodenoscopy  2006    Dr. Rourk: Subtle Schatzkis ring, otherwise normal upper GI tract, aside from a small pyloric channell erosion, status post dilation as described above.   . Colonoscopy  2006    RMR: 1. Internal hemorroids, otherwise normal rectum. 2. Pedunculated polyp at 35 cm. reomved with snare. The remainder of teh colonic mucosa appeared  normal.   . Colonoscopy  2011    Dr. Rourk: multiple ascending colon polyps and rectal polyp, adenomatous    Current Outpatient Prescriptions  Medication Sig Dispense Refill  . albuterol (PROVENTIL HFA;VENTOLIN HFA) 108 (90 BASE) MCG/ACT inhaler Inhale 2 puffs into the lungs every 6 (six) hours as needed for shortness of breath.     . albuterol (PROVENTIL) (2.5 MG/3ML) 0.083% nebulizer solution Take 2.5 mg by nebulization 3 (three) times daily.    . alprazolam (XANAX) 2 MG tablet Take 2 mg by mouth 3 (three) times daily as needed for anxiety.     . aspirin EC 325 MG tablet Take 1 tablet (325 mg total) by mouth daily. 30 tablet 0  . Carboxymethylcellulose Sodium (THERATEARS) 0.25 % SOLN Apply 1 drop to eye daily.    . esomeprazole (NEXIUM) 40 MG capsule Take 40 mg by mouth daily. For heartburn    . levothyroxine (LEVOTHROID) 50 MCG tablet Take 50 mcg by mouth daily before breakfast.    . methocarbamol (ROBAXIN) 500 MG tablet Take 1 tablet (500 mg total) by mouth 4 (four) times daily. 90 tablet 0  . metoCLOPramide (REGLAN) 5 MG tablet Take 5 mg by mouth every other day. Takes only rarely    . mometasone (NASONEX)   50 MCG/ACT nasal spray Place 2 sprays into the nose daily as needed (allergies).     . Multiple Vitamin (MULTIVITAMIN WITH MINERALS) TABS Take 1 tablet by mouth daily.    . ondansetron (ZOFRAN) 4 MG tablet Take 1 tablet (4 mg total) by mouth every 8 (eight) hours as needed for nausea or vomiting. 40 tablet 0  . oxyCODONE-acetaminophen (ROXICET) 5-325 MG per tablet Take 1-2 tablets by mouth every 4 (four) hours as needed. 60 tablet 0  . torsemide (DEMADEX) 20 MG tablet Take 40 mg by mouth daily.    . Vitamin D, Ergocalciferol, (DRISDOL) 50000 UNITS CAPS Take 100,000 Units by mouth every 7 (seven) days. On Wednesdays    . peg 3350 powder (MOVIPREP) 100 G SOLR Take 1 kit (200 g total) by mouth as directed. 1 kit 0  . simvastatin (ZOCOR) 10 MG tablet Take 10 mg by mouth daily.     No  current facility-administered medications for this visit.    Allergies as of 05/14/2014 - Review Complete 05/14/2014  Allergen Reaction Noted  . Sulfa antibiotics Swelling 08/12/2011  . Penicillins Hives, Itching, and Rash 08/12/2011    Family History  Problem Relation Age of Onset  . Colon cancer Neg Hx     History   Social History  . Marital Status: Married    Spouse Name: N/A  . Number of Children: N/A  . Years of Education: N/A   Occupational History  . Not on file.   Social History Main Topics  . Smoking status: Former Smoker -- 5 years    Types: Cigarettes    Quit date: 04/21/1998  . Smokeless tobacco: Never Used  . Alcohol Use: 1.2 oz/week    2 Glasses of wine per week  . Drug Use: No  . Sexual Activity: Yes    Birth Control/ Protection: Post-menopausal   Other Topics Concern  . Not on file   Social History Narrative    Review of Systems: Gen: Denies any fever, chills, fatigue, weight loss, lack of appetite.  CV: Denies chest pain, heart palpitations, peripheral edema, syncope.  Resp: Denies shortness of breath at rest or with exertion. Denies wheezing or cough.  GI: see HPI GU : Denies urinary burning, urinary frequency, urinary hesitancy MS: +arthritis Derm: Denies rash, itching, dry skin Psych: Denies depression, anxiety, memory loss, and confusion Heme: Denies bruising, bleeding, and enlarged lymph nodes.  Physical Exam: BP 137/85 mmHg  Pulse 87  Temp(Src) 97.5 F (36.4 C)  Ht 5' 1" (1.549 m)  Wt 230 lb (104.327 kg)  BMI 43.48 kg/m2 General:   Alert and oriented. Pleasant and cooperative. Well-nourished and well-developed.  Head:  Normocephalic and atraumatic. Eyes:  Without icterus, sclera clear and conjunctiva pink.  Ears:  Normal auditory acuity. Nose:  No deformity, discharge,  or lesions. Mouth:  No deformity or lesions, oral mucosa pink.  Lungs:  Clear to auscultation bilaterally. No wheezes, rales, or rhonchi. No distress.  Heart:   S1, S2 present without murmurs appreciated.  Abdomen:  +BS, soft, non-tender and non-distended. No HSM noted. No guarding or rebound. No masses appreciated.  Rectal:  Deferred  Msk:  Symmetrical without gross deformities. Normal posture. Extremities:  Without clubbing or edema. Neurologic:  Alert and  oriented x4;  grossly normal neurologically. Skin:  Intact without significant lesions or rashes. Psych:  Alert and cooperative. Normal mood and affect.    

## 2014-06-01 ENCOUNTER — Encounter (HOSPITAL_COMMUNITY): Payer: Self-pay | Admitting: Internal Medicine

## 2014-06-05 ENCOUNTER — Encounter: Payer: Self-pay | Admitting: Internal Medicine

## 2014-10-08 NOTE — Patient Instructions (Signed)
Holly Flores  10/08/2014     @PREFPERIOPPHARMACY @   Your procedure is scheduled on 10/12/2014  Report to [facility] at 6:30 A.M.  Call this number if you have problems the morning of surgery:  [phone]   Remember:  Do not eat food or drink liquids after midnight.  Take these medicines the morning of surgery with A SIP OF WATER Albuterol inhaler (Bring with you, as well), Xanax, Nexium,   Synthroid, Robaxin, Reglan, Nasonex, Zofran, Oxycodone   Do not wear jewelry, make-up or nail polish.  Do not wear lotions, powders, or perfumes.  You may wear deodorant.  Do not shave 48 hours prior to surgery.  Men may shave face and neck.  Do not bring valuables to the hospital.  [facility] is not responsible for any belongings or valuables.  Contacts, dentures or bridgework may not be worn into surgery.  Leave your suitcase in the car.  After surgery it may be brought to your room.  For patients admitted to the hospital, discharge time will be determined by your treatment team.  Patients discharged the day of surgery will not be allowed to drive home.    Please read over the following fact sheets that you were given. Anesthesia Post-op Instructions     PATIENT INSTRUCTIONS POST-ANESTHESIA  IMMEDIATELY FOLLOWING SURGERY:  Do not drive or operate machinery for the first twenty four hours after surgery.  Do not make any important decisions for twenty four hours after surgery or while taking narcotic pain medications or sedatives.  If you develop intractable nausea and vomiting or a severe headache please notify your doctor immediately.  FOLLOW-UP:  Please make an appointment with your surgeon as instructed. You do not need to follow up with anesthesia unless specifically instructed to do so.  WOUND CARE INSTRUCTIONS (if applicable):  Keep a dry clean dressing on the anesthesia/puncture wound site if there is drainage.  Once the wound has quit draining you may leave it open to air.   Generally you should leave the bandage intact for twenty four hours unless there is drainage.  If the epidural site drains for more than 36-48 hours please call the anesthesia department.  QUESTIONS?:  Please feel free to call your physician or the hospital operator if you have any questions, and they will be happy to assist you.      Cataract Surgery  A cataract is a clouding of the lens of the eye. When a lens becomes cloudy, vision is reduced based on the degree and nature of the clouding. Surgery may be needed to improve vision. Surgery removes the cloudy lens and usually replaces it with a substitute lens (intraocular lens, IOL). LET YOUR EYE DOCTOR KNOW ABOUT:  Allergies to food or medicine.  Medicines taken including herbs, eye drops, over-the-counter medicines, and creams.  Use of steroids (by mouth or creams).  Previous problems with anesthetics or numbing medicine.  History of bleeding problems or blood clots.  Previous surgery.  Other health problems, including diabetes and kidney problems.  Possibility of pregnancy, if this applies. RISKS AND COMPLICATIONS  Infection.  Inflammation of the eyeball (endophthalmitis) that can spread to both eyes (sympathetic ophthalmia).  Poor wound healing.  If an IOL is inserted, it can later fall out of proper position. This is very uncommon.  Clouding of the part of your eye that holds an IOL in place. This is called an "after-cataract." These are uncommon but easily treated. BEFORE THE PROCEDURE  Do  not eat or drink anything except small amounts of water for 8 to 12 before your surgery, or as directed by your caregiver.  Unless you are told otherwise, continue any eye drops you have been prescribed.  Talk to your primary caregiver about all other medicines that you take (both prescription and nonprescription). In some cases, you may need to stop or change medicines near the time of your surgery. This is most important if you  are taking blood-thinning medicine.Do not stop medicines unless you are told to do so.  Arrange for someone to drive you to and from the procedure.  Do not put contact lenses in either eye on the day of your surgery. PROCEDURE There is more than one method for safely removing a cataract. Your doctor can explain the differences and help determine which is best for you. Phacoemulsification surgery is the most common form of cataract surgery.  An injection is given behind the eye or eye drops are given to make this a painless procedure.  A small cut (incision) is made on the edge of the clear, dome-shaped surface that covers the front of the eye (cornea).  A tiny probe is painlessly inserted into the eye. This device gives off ultrasound waves that soften and break up the cloudy center of the lens. This makes it easier for the cloudy lens to be removed by suction.  An IOL may be implanted.  The normal lens of the eye is covered by a clear capsule. Part of that capsule is intentionally left in the eye to support the IOL.  Your surgeon may or may not use stitches to close the incision. There are other forms of cataract surgery that require a larger incision and stitches to close the eye. This approach is taken in cases where the doctor feels that the cataract cannot be easily removed using phacoemulsification. AFTER THE PROCEDURE  When an IOL is implanted, it does not need care. It becomes a permanent part of your eye and cannot be seen or felt.  Your doctor will schedule follow-up exams to check on your progress.  Review your other medicines with your doctor to see which can be resumed after surgery.  Use eye drops or take medicine as prescribed by your doctor. Document Released: 01/22/2011 Document Revised: 06/19/2013 Document Reviewed: 01/22/2011 [facility] Patient Information 2015 [place], [place]. This information is not intended to replace advice given to you by your health care  provider. Make sure you discuss any questions you have with your health care provider.

## 2014-10-09 ENCOUNTER — Encounter (HOSPITAL_COMMUNITY): Payer: Self-pay

## 2014-10-09 ENCOUNTER — Encounter (HOSPITAL_COMMUNITY)
Admission: RE | Admit: 2014-10-09 | Discharge: 2014-10-09 | Disposition: A | Discharge status: Home / Self Care | Payer: Medicare Other | Source: Ambulatory Visit | Attending: Ophthalmology | Admitting: Ophthalmology

## 2014-10-09 DIAGNOSIS — H2511 Age-related nuclear cataract, right eye: Secondary | ICD-10-CM | POA: Insufficient documentation

## 2014-10-09 DIAGNOSIS — Z01818 Encounter for other preprocedural examination: Secondary | ICD-10-CM | POA: Insufficient documentation

## 2014-10-09 HISTORY — DX: Acute myocardial infarction, unspecified: I21.9

## 2014-10-09 LAB — CBC
HCT: 41.1 % (ref 36.0–46.0)
Hemoglobin: 13.6 g/dL (ref 12.0–15.0)
MCH: 30.4 pg (ref 26.0–34.0)
MCHC: 33.1 g/dL (ref 30.0–36.0)
MCV: 91.9 fL (ref 78.0–100.0)
PLATELETS: 235 10*3/uL (ref 150–400)
RBC: 4.47 MIL/uL (ref 3.87–5.11)
RDW: 13.9 % (ref 11.5–15.5)
WBC: 8.7 10*3/uL (ref 4.0–10.5)

## 2014-10-09 LAB — BASIC METABOLIC PANEL
Anion gap: 7 (ref 5–15)
BUN: 16 mg/dL (ref 6–20)
CALCIUM: 8.4 mg/dL — AB (ref 8.9–10.3)
CO2: 29 mmol/L (ref 22–32)
CREATININE: 1.08 mg/dL — AB (ref 0.44–1.00)
Chloride: 101 mmol/L (ref 101–111)
GFR calc Af Amer: 60 mL/min — ABNORMAL LOW (ref >60)
GFR, EST NON AFRICAN AMERICAN: 52 mL/min — AB (ref >60)
Glucose, Bld: 115 mg/dL — ABNORMAL HIGH (ref 65–99)
POTASSIUM: 3.1 mmol/L — AB (ref 3.5–5.1)
SODIUM: 137 mmol/L (ref 135–145)

## 2014-10-11 NOTE — H&P (Signed)
Pre Surgical History and Physical Exam  Patient:  Holly   C. Flores     Date:      10/09/2014 9:28:08 AM  Chief Complaint Pt states OD is pulling c/o having to look with one eye.  Pt states Dr. Celesta Aver wants cataract removed OD so better retinal view  +HPI:   Location OU  Quality Pt states OD is pulling c/o having to look with one eye.  Pt states Dr. Celesta Aver wants cataract removed OD so better retinal view  Severity moderate  Duration x yrs  Timing constant  Context Hx:  cat OD, PC IOL OS, retinal edema OS, macula puckering OS, last Avastin inj OS 09/26/12, CRVO OS  Modifying Factors Gtts- Artificial Tears prn OU,Tobrex QID OS for Avastin injection  Medical History:              Retinal detachment , recent , unspecified OS  Incomplete CRVO OS  Pseudophakia OS  Vitreous hemorrhage  OS Hypertension  Headache  Visual field disturbance  Blurred vision  BRVO OS  Floaters OU  Cataract, Nuclear OD  Dry Eye  Cataract , Cortical  OD  Presbyopia Hyperopia  Family Hx:                      non contributary  Social Hx:                        Allergy:                          Sulfa PCN Med List:                        Demadex, Xanax, Nexium, Simvastin, Potassium, Vit D, Reglan  Notes:                               Review of Systems:   Pt admits to:Fatigue  The patient admits to: and sinus problems   Patient admits to and low thyroid  The patient admits [id number] and Breathing  The patient admits to:, Hypertension and irregular heart rhythm  The patient admits to: and Acid Reflux  The patient admits to: and Arthritis  The patient admits YK:DXIPJASN, joint pain, Headaches  The patient admits to:, depression and anxiety  The patient admits  to:Thyroid   Physical Exam: General:  The patient is a well nourished, well developed [place] American female.  Systolic: Diastolic: Pulse:  [phone] 78    SHEENT:          Normocephalic, normally pigmented, atraumatic  Neck:               without mass, midline structural deviation or malposition      Chest:             clear to auscultation bilaterally Breast:            Deferred  Heart:             The heart sounds are normal.  There is no murmur or gallop present  Abdomen:       Normal bowel sounds present  Pelvic:            Deferred GU:                 Deferred Extremities:  symmetric without pretibial edema Skin:               Deferred  Neurological:  Pt oriented, cranial nerves grossly normal   Eye Exam:   Best corrected Vision Right:  20/25      Left:  20/60-2  Spectacle Rx Right: +1.00 sph  Spectacle Rx Left:   -1.00 sph   Eye Exam                RIGHT   Lids:                       Lids function normally and are symmetrically, and normally positioned with clean/quiet margins OU.   Conjunctiva:           White and quiet  Cornea:                  Clear, with adequate cell count  Anterior Chamber:  deep and quiet  Iris:                        The iris is uniformly colored with a round symmetric pupil.  Lens:                      4+ nuclear sclerosis  Anterior Vitreous:   Clear and quiet Tonometry, right:   18    left:  14    Fundus, disc:           The discs are  normal, healthy appearing OU.  Fundus, macula:      The background, macula, and vessels are normal in appearance.  Dr. Iona Hansen Notes:    Please add 4.0 mL Omidria (phenylephrine 1% / ketorolac 0.3%) to 500 mL BSS irrigation solution no items of interest  Ultrasonic Digital Biometry OD:  23.71                       OD                        PC Lens (primary lens)  Alcon [id number]   Lens Power +20.00   Calc. Ametropia plano         AC Lens (Alt. lens) Alcon [id number]      Lens Power +17.50          Diagnosis:     Surgical Cataract RIGHT has been specifically requested by the patient with reasonable eexpectation of achieving stated visual/functional goals.  Alternative treatments, including optical correction have been discussed with the patient  as well as the mitigating effects of ocular co-morbidities on final vision.   Patient's statement of visual disability OD:  Has indicated that he/she has difficulty in the following tasks associated with daily living: difficulty driving at night due to glare, halos, or rings Blurry or hazy vision Light sensitivity or experiencing glare on sunny days Decreased vision at night difficulty writing checks, letters, or filling out forms     Surgical Plan:  Phacoemulsification with lens implant OD (RIGHT EYE)   Post Op Appt:  10/16/14@9 :45 am  in [facility].    Lala Lund, MD 10/09/14 09:19:52 Digitally signed to expedite the transference of documentation.*

## 2014-10-12 NOTE — OR Nursing (Signed)
Dr. Patsey Berthold notified of patient history and  labs.  Dr. Iona Hansen also notified. He is going to contact patient.

## 2014-10-15 ENCOUNTER — Encounter (HOSPITAL_COMMUNITY): Payer: Self-pay | Admitting: Anesthesiology

## 2014-10-15 ENCOUNTER — Ambulatory Visit (HOSPITAL_COMMUNITY): Payer: Medicare Other | Admitting: Anesthesiology

## 2014-10-15 ENCOUNTER — Ambulatory Visit (HOSPITAL_COMMUNITY)
Admission: RE | Admit: 2014-10-15 | Discharge: 2014-10-15 | Disposition: A | Discharge status: Home / Self Care | Payer: Medicare Other | Source: Ambulatory Visit | Attending: Ophthalmology | Admitting: Ophthalmology

## 2014-10-15 ENCOUNTER — Encounter (HOSPITAL_COMMUNITY)
Admission: RE | Disposition: A | Discharge status: Home / Self Care | Payer: Self-pay | Source: Ambulatory Visit | Attending: Ophthalmology

## 2014-10-15 ENCOUNTER — Other Ambulatory Visit: Payer: Self-pay

## 2014-10-15 DIAGNOSIS — H2511 Age-related nuclear cataract, right eye: Secondary | ICD-10-CM | POA: Diagnosis not present

## 2014-10-15 DIAGNOSIS — Z87891 Personal history of nicotine dependence: Secondary | ICD-10-CM | POA: Diagnosis not present

## 2014-10-15 DIAGNOSIS — Z7982 Long term (current) use of aspirin: Secondary | ICD-10-CM | POA: Insufficient documentation

## 2014-10-15 DIAGNOSIS — F418 Other specified anxiety disorders: Secondary | ICD-10-CM | POA: Insufficient documentation

## 2014-10-15 DIAGNOSIS — Z01810 Encounter for preprocedural cardiovascular examination: Secondary | ICD-10-CM | POA: Diagnosis not present

## 2014-10-15 DIAGNOSIS — J449 Chronic obstructive pulmonary disease, unspecified: Secondary | ICD-10-CM | POA: Insufficient documentation

## 2014-10-15 DIAGNOSIS — Z79899 Other long term (current) drug therapy: Secondary | ICD-10-CM | POA: Insufficient documentation

## 2014-10-15 DIAGNOSIS — I10 Essential (primary) hypertension: Secondary | ICD-10-CM | POA: Insufficient documentation

## 2014-10-15 DIAGNOSIS — K219 Gastro-esophageal reflux disease without esophagitis: Secondary | ICD-10-CM | POA: Insufficient documentation

## 2014-10-15 HISTORY — PX: CATARACT EXTRACTION W/PHACO: SHX586

## 2014-10-15 SURGERY — PHACOEMULSIFICATION, CATARACT, WITH IOL INSERTION
Anesthesia: Monitor Anesthesia Care | Site: Eye | Laterality: Right

## 2014-10-15 MED ORDER — LIDOCAINE HCL 3.5 % OP GEL
OPHTHALMIC | Status: DC | PRN
Start: 2014-10-15 — End: 2014-10-15
  Administered 2014-10-15: 1 via OPHTHALMIC

## 2014-10-15 MED ORDER — POVIDONE-IODINE 5 % OP SOLN
OPHTHALMIC | Status: DC | PRN
  Administered 2014-10-15: 1 via OPHTHALMIC

## 2014-10-15 MED ORDER — CYCLOPENTOLATE-PHENYLEPHRINE OP SOLN OPTIME - NO CHARGE
OPHTHALMIC | Status: AC
Start: 2014-10-15 — End: 2014-10-15
  Filled 2014-10-15: qty 2

## 2014-10-15 MED ORDER — FENTANYL CITRATE (PF) 100 MCG/2ML IJ SOLN
INTRAMUSCULAR | Status: AC
Start: 2014-10-15 — End: 2014-10-15
  Filled 2014-10-15: qty 2

## 2014-10-15 MED ORDER — MIDAZOLAM HCL 2 MG/2ML IJ SOLN
INTRAMUSCULAR | Status: AC
  Filled 2014-10-15: qty 2

## 2014-10-15 MED ORDER — NA HYALUR & NA CHOND-NA HYALUR 0.55-0.5 ML IO KIT
PACK | INTRAOCULAR | Status: DC | PRN
  Administered 2014-10-15: 1 via OPHTHALMIC

## 2014-10-15 MED ORDER — FENTANYL CITRATE (PF) 100 MCG/2ML IJ SOLN
25.0000 ug | INTRAMUSCULAR | Status: AC
  Administered 2014-10-15 (×2): 25 ug via INTRAVENOUS

## 2014-10-15 MED ORDER — LACTATED RINGERS IV SOLN
INTRAVENOUS | Status: DC
  Administered 2014-10-15: 1000 mL via INTRAVENOUS

## 2014-10-15 MED ORDER — PHENYLEPHRINE-KETOROLAC 1-0.3 % IO SOLN
INTRAOCULAR | Status: AC
  Filled 2014-10-15: qty 4

## 2014-10-15 MED ORDER — MIDAZOLAM HCL 2 MG/2ML IJ SOLN
1.0000 mg | INTRAMUSCULAR | Status: DC | PRN
  Administered 2014-10-15: 2 mg via INTRAVENOUS

## 2014-10-15 MED ORDER — LIDOCAINE HCL 3.5 % OP GEL: 1.0000 "application " | Freq: Once | OPHTHALMIC | Status: DC

## 2014-10-15 MED ORDER — BSS IO SOLN
INTRAOCULAR | Status: DC | PRN
  Administered 2014-10-15: 15 mL

## 2014-10-15 MED ORDER — TETRACAINE 0.5 % OP SOLN OPTIME - NO CHARGE
OPHTHALMIC | Status: DC | PRN
  Administered 2014-10-15: 1 [drp] via OPHTHALMIC

## 2014-10-15 MED ORDER — LIDOCAINE HCL 3.5 % OP GEL
OPHTHALMIC | Status: AC
  Filled 2014-10-15: qty 1

## 2014-10-15 MED ORDER — CYCLOPENTOLATE-PHENYLEPHRINE 0.2-1 % OP SOLN
1.0000 [drp] | OPHTHALMIC | Status: AC
  Administered 2014-10-15 (×3): 1 [drp] via OPHTHALMIC

## 2014-10-15 MED ORDER — TETRACAINE HCL 0.5 % OP SOLN
OPHTHALMIC | Status: AC
Start: 2014-10-15 — End: 2014-10-15
  Filled 2014-10-15: qty 2

## 2014-10-15 MED ORDER — PHENYLEPHRINE-KETOROLAC 1-0.3 % IO SOLN
INTRAOCULAR | Status: DC | PRN
  Administered 2014-10-15: 500 mL via OPHTHALMIC

## 2014-10-15 MED ORDER — TETRACAINE HCL 0.5 % OP SOLN
1.0000 [drp] | OPHTHALMIC | Status: AC
Start: 2014-10-15 — End: 2014-10-15
  Administered 2014-10-15 (×3): 1 [drp] via OPHTHALMIC

## 2014-10-15 MED ORDER — LACTATED RINGERS IV SOLN
INTRAVENOUS | Status: DC | PRN
  Administered 2014-10-15: 08:00:00 via INTRAVENOUS

## 2014-10-15 SURGICAL SUPPLY — 8 items
CLOTH BEACON ORANGE TIMEOUT ST (SAFETY) ×2 IMPLANT
GLOVE BIOGEL PI IND STRL 7.0 (GLOVE) ×1 IMPLANT
GLOVE BIOGEL PI INDICATOR 7.0 (GLOVE) ×1
GLOVE EXAM NITRILE MD LF STRL (GLOVE) ×2 IMPLANT
INST SET CATARACT ~~LOC~~ (KITS) ×2 IMPLANT
LENS ALC ACRYL/TECN (Ophthalmic Related) ×2 IMPLANT
PAD ARMBOARD 7.5X6 YLW CONV (MISCELLANEOUS) ×2 IMPLANT
WATER STERILE IRR 250ML POUR (IV SOLUTION) ×2 IMPLANT

## 2014-10-15 NOTE — Brief Op Note (Deleted)
Holly Flores 10/15/2014  Williams Che, MD  Pre-op Diagnosis:   Secondary cataract Left eye  Post-op Diagnosis:    Secondary cataract Left eye  Yag laser self-test completed: Yes.    Indications:  See H&P  Procedure:  YAG capsulotomy left eye  Eye protection worn by staff:  Yes.   Laser In Use sign on door:  Yes.    Laser: {LUMENIS YAG/SLT LASER  Power Setting:  1.7 mJ/burst Anatomical site treated:  Posterior capsule Number of applications:  31 Total energy delivered: 49.97 mJ Results:  Good central capsular opening  The patient was discharged home with instructions to continue all her current glaucoma medications in the un-operated eye, and discontinue all her current glaucoma medications, if any.  Patient was instructed to go to the office, as previously scheduled, for intraocular pressure:  No.  Patient verbalizes understanding of discharge instructions:  Yes.    Notes:  Pt  Tolerated procedure well.  There were no  complications

## 2014-10-15 NOTE — Anesthesia Procedure Notes (Signed)
Procedure Name: MAC Date/Time: 10/15/2014 8:33 AM Performed by: Andree Elk, Gaylyn Berish A Pre-anesthesia Checklist: Patient identified, Timeout performed, Emergency Drugs available, Suction available and Patient being monitored Oxygen Delivery Method: Nasal cannula

## 2014-10-15 NOTE — Brief Op Note (Signed)
10/15/2014  9:03 AM  PATIENT:  Secundino Ginger  69 y.o. female  PRE-OPERATIVE DIAGNOSIS:  nuclear cataract right eye  POST-OPERATIVE DIAGNOSIS:  nuclear cataract right eye  PROCEDURE:  Procedure(s): CATARACT EXTRACTION PHACO AND INTRAOCULAR LENS PLACEMENT (IOC)  SURGEON:  Surgeon(s): Williams Che, MD  ASSISTANTS: Katheran James, CST   ANESTHESIA STAFF: Anesthesiologist: Lerry Liner, MD CRNA: Mickel Baas, CRNA  ANESTHESIA:   topical and MAC  REQUESTED LENS POWER: 20.0  LENS IMPLANT INFORMATION:   Alcon [id number]  S/n  [id number]  Exp 12/2018  CUMULATIVE DISSIPATED ENERGY:4.19  INDICATIONS:see  H&P for specific details  OP FINDINGS:  Dense NS  COMPLICATIONS:None  DICTATION #: none  PLAN OF CARE: as above  PATIENT DISPOSITION:  Short Stay

## 2014-10-15 NOTE — Transfer of Care (Signed)
Immediate Anesthesia Transfer of Care Note  Patient: Holly Flores  Procedure(s) Performed: Procedure(s) with comments: CATARACT EXTRACTION PHACO AND INTRAOCULAR LENS PLACEMENT (IOC) (Right) - CDE:4.19  Patient Location: Short Stay  Anesthesia Type:MAC  Level of Consciousness: awake, alert , oriented and patient cooperative  Airway & Oxygen Therapy: Patient Spontanous Breathing  Post-op Assessment: Report given to RN and Post -op Vital signs reviewed and stable  Post vital signs: Reviewed and stable  Last Vitals:  Filed Vitals:   10/15/14 0815  BP: 140/76  Temp:   Resp: 15    Complications: No apparent anesthesia complications

## 2014-10-15 NOTE — Anesthesia Postprocedure Evaluation (Signed)
  Anesthesia Post-op Note  Patient: Holly Flores  Procedure(s) Performed: Procedure(s) with comments: CATARACT EXTRACTION PHACO AND INTRAOCULAR LENS PLACEMENT (IOC) (Right) - CDE:4.19  Patient Location: Short Stay  Anesthesia Type:MAC  Level of Consciousness: awake, alert , oriented and patient cooperative  Airway and Oxygen Therapy: Patient Spontanous Breathing  Post-op Pain: none  Post-op Assessment: Post-op Vital signs reviewed, Patient's Cardiovascular Status Stable, Respiratory Function Stable, Patent Airway, No signs of Nausea or vomiting and Pain level controlled              Post-op Vital Signs: Reviewed and stable  Last Vitals:  Filed Vitals:   10/15/14 0815  BP: 140/76  Temp:   Resp: 15    Complications: No apparent anesthesia complications

## 2014-10-15 NOTE — Anesthesia Preprocedure Evaluation (Signed)
Anesthesia Evaluation  Patient identified by MRN, date of birth, ID band Patient awake    Reviewed: Allergy & Precautions  Airway Mallampati: I  TM Distance: >3 FB Neck ROM: Full    Dental  (+) Edentulous Upper, Edentulous Lower   Pulmonary shortness of breath, sleep apnea , COPDformer smoker,    Pulmonary exam normal       Cardiovascular hypertension, Pt. on medications +CHF Rhythm:Regular Rate:Normal     Neuro/Psych PSYCHIATRIC DISORDERS Anxiety Depression CVA    GI/Hepatic Neg liver ROS, GERD-  Medicated,  Endo/Other    Renal/GU   negative genitourinary   Musculoskeletal   Abdominal (+) + obese,  Abdomen: soft.    Peds  Hematology   Anesthesia Other Findings   Reproductive/Obstetrics                             Anesthesia Physical Anesthesia Plan  ASA: III  Anesthesia Plan: MAC   Post-op Pain Management:    Induction: Intravenous  Airway Management Planned: Nasal Cannula  Additional Equipment:   Intra-op Plan:   Post-operative Plan:   Informed Consent: I have reviewed the patients History and Physical, chart, labs and discussed the procedure including the risks, benefits and alternatives for the proposed anesthesia with the patient or authorized representative who has indicated his/her understanding and acceptance.     Plan Discussed with: CRNA  Anesthesia Plan Comments:         Anesthesia Quick Evaluation

## 2014-10-15 NOTE — Op Note (Signed)
10/15/2014  9:03 AM  PATIENT:  Holly Flores  69 y.o. female  PRE-OPERATIVE DIAGNOSIS:  nuclear cataract right eye  POST-OPERATIVE DIAGNOSIS:  nuclear cataract right eye  PROCEDURE:  Procedure(s): CATARACT EXTRACTION PHACO AND INTRAOCULAR LENS PLACEMENT (IOC)  SURGEON:  Surgeon(s): Williams Che, MD  ASSISTANTS: Katheran James, CST   ANESTHESIA STAFF: Anesthesiologist: Lerry Liner, MD CRNA: Mickel Baas, CRNA  ANESTHESIA:   topical and MAC  REQUESTED LENS POWER: 20.0  LENS IMPLANT INFORMATION:   Alcon [id number]  S/n  [id number]  Exp 12/2018  CUMULATIVE DISSIPATED ENERGY:4.19  INDICATIONS:see  H&P for specific details  OP FINDINGS:  Dense NS  COMPLICATIONS:None  PROCEDURE:  The patient was brought to the operating room in good condition.  The operative eye was prepped and draped in the usual fashion for intraocular surgery.  Lidocaine gel was dropped onto the eye.  A 2.4 mm 10 O'clock near clear corneal stepped incision and a 12 O'clock stab incision were created.  Viscoat was instilled into the anterior chamber.  The 5 mm anterior capsulorhexis was performed with a bent needle cystotome and Utrata forceps.  The lens was hydrodissected and hydrodelineated with a cannula and balanced salt solution and rotated with a Kuglen hook.  Phacoemulsification was perfomed in the divide and conquer technique.  The remaining cortex was removed with I&A and the capsular surfaces polished as necessary.  Provisc was placed into the capsular bag and the lens inserted with the Alcon inserter.  The viscoelastic was removed with I&A and the lens "rocked" into position.  The wounds were hydrated and te anterior chamber was refilled with balanced salt solution.  The wounds were checked for leakage and rehydrated as necessary.  The lid speculum and drapes were removed and the patient was transported to short stay in good condition.  PATIENT DISPOSITION:  Short Stay

## 2014-10-15 NOTE — H&P (Signed)
I have reviewed the pre printed H&P, the patient was re-examined, and I have identified no significant interval changes in the patient's medical condition.  There is no change in the plan of care since the history and physical of record. 

## 2014-10-16 ENCOUNTER — Encounter (HOSPITAL_COMMUNITY): Payer: Self-pay | Admitting: Ophthalmology

## 2014-12-10 ENCOUNTER — Other Ambulatory Visit: Payer: Self-pay

## 2014-12-10 DIAGNOSIS — Z1231 Encounter for screening mammogram for malignant neoplasm of breast: Secondary | ICD-10-CM

## 2014-12-13 ENCOUNTER — Ambulatory Visit
Admission: RE | Admit: 2014-12-13 | Discharge: 2014-12-13 | Disposition: A | Discharge status: Home / Self Care | Payer: Medicare Other | Source: Ambulatory Visit

## 2014-12-13 DIAGNOSIS — Z1231 Encounter for screening mammogram for malignant neoplasm of breast: Secondary | ICD-10-CM

## 2014-12-29 ENCOUNTER — Encounter (HOSPITAL_COMMUNITY): Payer: Self-pay | Admitting: *Deleted

## 2014-12-29 ENCOUNTER — Inpatient Hospital Stay (HOSPITAL_COMMUNITY)
Admission: EM | Admit: 2014-12-29 | Discharge: 2014-12-31 | DRG: 103 | Disposition: A | Discharge status: Home / Self Care | Payer: Medicare Other | Attending: Family Medicine | Admitting: Family Medicine

## 2014-12-29 ENCOUNTER — Emergency Department (HOSPITAL_COMMUNITY): Payer: Medicare Other

## 2014-12-29 DIAGNOSIS — M1990 Unspecified osteoarthritis, unspecified site: Secondary | ICD-10-CM | POA: Diagnosis present

## 2014-12-29 DIAGNOSIS — I69354 Hemiplegia and hemiparesis following cerebral infarction affecting left non-dominant side: Secondary | ICD-10-CM

## 2014-12-29 DIAGNOSIS — R51 Headache: Secondary | ICD-10-CM

## 2014-12-29 DIAGNOSIS — Z7982 Long term (current) use of aspirin: Secondary | ICD-10-CM | POA: Diagnosis not present

## 2014-12-29 DIAGNOSIS — G43909 Migraine, unspecified, not intractable, without status migrainosus: Principal | ICD-10-CM | POA: Diagnosis present

## 2014-12-29 DIAGNOSIS — E039 Hypothyroidism, unspecified: Secondary | ICD-10-CM | POA: Diagnosis present

## 2014-12-29 DIAGNOSIS — Z9114 Patient's other noncompliance with medication regimen: Secondary | ICD-10-CM

## 2014-12-29 DIAGNOSIS — I110 Hypertensive heart disease with heart failure: Secondary | ICD-10-CM | POA: Diagnosis present

## 2014-12-29 DIAGNOSIS — K219 Gastro-esophageal reflux disease without esophagitis: Secondary | ICD-10-CM | POA: Diagnosis present

## 2014-12-29 DIAGNOSIS — I2510 Atherosclerotic heart disease of native coronary artery without angina pectoris: Secondary | ICD-10-CM | POA: Diagnosis present

## 2014-12-29 DIAGNOSIS — R296 Repeated falls: Secondary | ICD-10-CM | POA: Diagnosis present

## 2014-12-29 DIAGNOSIS — Z87891 Personal history of nicotine dependence: Secondary | ICD-10-CM | POA: Diagnosis not present

## 2014-12-29 DIAGNOSIS — I252 Old myocardial infarction: Secondary | ICD-10-CM | POA: Diagnosis not present

## 2014-12-29 DIAGNOSIS — E785 Hyperlipidemia, unspecified: Secondary | ICD-10-CM | POA: Diagnosis present

## 2014-12-29 DIAGNOSIS — Z96651 Presence of right artificial knee joint: Secondary | ICD-10-CM | POA: Diagnosis present

## 2014-12-29 DIAGNOSIS — J449 Chronic obstructive pulmonary disease, unspecified: Secondary | ICD-10-CM | POA: Diagnosis present

## 2014-12-29 DIAGNOSIS — Z23 Encounter for immunization: Secondary | ICD-10-CM | POA: Diagnosis not present

## 2014-12-29 DIAGNOSIS — I639 Cerebral infarction, unspecified: Secondary | ICD-10-CM

## 2014-12-29 DIAGNOSIS — I509 Heart failure, unspecified: Secondary | ICD-10-CM | POA: Diagnosis present

## 2014-12-29 DIAGNOSIS — R519 Headache, unspecified: Secondary | ICD-10-CM | POA: Diagnosis present

## 2014-12-29 LAB — URINE MICROSCOPIC-ADD ON

## 2014-12-29 LAB — CBC WITH DIFFERENTIAL/PLATELET
BASOS ABS: 0 10*3/uL (ref 0.0–0.1)
Basophils Relative: 0 %
EOS ABS: 0.4 10*3/uL (ref 0.0–0.7)
EOS PCT: 4 %
HCT: 41.6 % (ref 36.0–46.0)
Hemoglobin: 13.7 g/dL (ref 12.0–15.0)
LYMPHS PCT: 45 %
Lymphs Abs: 4.8 10*3/uL — ABNORMAL HIGH (ref 0.7–4.0)
MCH: 30.4 pg (ref 26.0–34.0)
MCHC: 32.9 g/dL (ref 30.0–36.0)
MCV: 92.2 fL (ref 78.0–100.0)
MONO ABS: 0.8 10*3/uL (ref 0.1–1.0)
Monocytes Relative: 7 %
Neutro Abs: 4.8 10*3/uL (ref 1.7–7.7)
Neutrophils Relative %: 44 %
PLATELETS: 250 10*3/uL (ref 150–400)
RBC: 4.51 MIL/uL (ref 3.87–5.11)
RDW: 13.9 % (ref 11.5–15.5)
WBC: 10.8 10*3/uL — ABNORMAL HIGH (ref 4.0–10.5)

## 2014-12-29 LAB — URINALYSIS, ROUTINE W REFLEX MICROSCOPIC
Bilirubin Urine: NEGATIVE
GLUCOSE, UA: NEGATIVE mg/dL
Ketones, ur: NEGATIVE mg/dL
LEUKOCYTES UA: NEGATIVE
Nitrite: NEGATIVE
Protein, ur: NEGATIVE mg/dL
SPECIFIC GRAVITY, URINE: 1.025 (ref 1.005–1.030)
Urobilinogen, UA: 0.2 mg/dL (ref 0.0–1.0)
pH: 5.5 (ref 5.0–8.0)

## 2014-12-29 LAB — COMPREHENSIVE METABOLIC PANEL
ALT: 11 U/L — ABNORMAL LOW (ref 14–54)
AST: 13 U/L — AB (ref 15–41)
Albumin: 3.7 g/dL (ref 3.5–5.0)
Alkaline Phosphatase: 87 U/L (ref 38–126)
Anion gap: 8 (ref 5–15)
BUN: 16 mg/dL (ref 6–20)
CHLORIDE: 103 mmol/L (ref 101–111)
CO2: 31 mmol/L (ref 22–32)
Calcium: 9 mg/dL (ref 8.9–10.3)
Creatinine, Ser: 1.44 mg/dL — ABNORMAL HIGH (ref 0.44–1.00)
GFR, EST AFRICAN AMERICAN: 42 mL/min — AB (ref >60)
GFR, EST NON AFRICAN AMERICAN: 36 mL/min — AB (ref >60)
Glucose, Bld: 113 mg/dL — ABNORMAL HIGH (ref 65–99)
POTASSIUM: 3.4 mmol/L — AB (ref 3.5–5.1)
SODIUM: 142 mmol/L (ref 135–145)
Total Bilirubin: 0.2 mg/dL — ABNORMAL LOW (ref 0.3–1.2)
Total Protein: 7.2 g/dL (ref 6.5–8.1)

## 2014-12-29 LAB — TROPONIN I: Troponin I: <0.03 ng/mL (ref <0.031)

## 2014-12-29 LAB — SEDIMENTATION RATE: Sed Rate: 17 mm/hr (ref 0–22)

## 2014-12-29 LAB — CBG MONITORING, ED: GLUCOSE-CAPILLARY: 106 mg/dL — AB (ref 65–99)

## 2014-12-29 LAB — BRAIN NATRIURETIC PEPTIDE: B NATRIURETIC PEPTIDE 5: 26 pg/mL (ref 0.0–100.0)

## 2014-12-29 MED ORDER — HYDROCODONE-ACETAMINOPHEN 5-325 MG PO TABS
1.0000 | ORAL_TABLET | Freq: Once | ORAL | Status: AC
  Administered 2014-12-29: 1 via ORAL
  Filled 2014-12-29: qty 1

## 2014-12-29 NOTE — ED Notes (Signed)
Pt c/o left sided headache. Pt also c/o left sided numbness since Wednesday in the left side if her head, shoulder and arm. Pt states her left leg is always numb. Pt also centralized, chest pain and pressure since 4pm. Pt has taken 2 nitro with some relief. Pt then states when her head started hurting.

## 2014-12-29 NOTE — ED Provider Notes (Signed)
CSN: [id number]     Arrival date & time 12/29/14  1945 History  By signing my name below, I, Hansel Feinstein, attest that this documentation has been prepared under the direction and in the presence of Forde Dandy, MD. Electronically Signed: Hansel Feinstein, ED Scribe. 12/29/2014. 9:29 PM.   Chief Complaint  Patient presents with  . Headache   The history is provided by the patient. No language interpreter was used.    HPI Comments: Holly Flores is a 69 y.o. female who is right hand dominant with h/o CHF, left-sided stroke, HTN, GERD, COPD, HLD, CAD, and  hypothyroidism who presents to the Emergency Department complaining of moderate, persistent, waxing and waning left-sided temporal HA onset 3 days ago that woke her from her sleep. This is associated with left-sided arm and leg numbness, tingling and weakness in the LUE and LLE, nausea and emesis today.   She also complains of CP and pressure onset today at 4PM, SOB, increased leg swelling for 3 days. Pt states that the left-sided numbness and tingling began shortly after her HA. Pt states she took 2 NTG with relief of CP. She also notes she has taken pain medication with temporary relief of HA.   Per husband, she also experienced 15 minutes of slurred speech 3 days ago. She also states that she has been falling frequently this week secondary to worsened weakness in her left leg.  No h/o similar HA. H/o similar CP and SOB due to CHF. She states that she has gained 5 pounds in the past 3 days. She notes that she has increased her dosage of her fluid pills in the past 3 days.  Past Medical History  Diagnosis Date  . CHF (congestive heart failure) ([place])   . Arthritis   . Stroke ([place])   . Hypertension   . GERD (gastroesophageal reflux disease)   . COPD (chronic obstructive pulmonary disease) ([place])   . Shortness of breath   . Anxiety   . Depression   . Hyperlipidemia   . Goiter   . Kidney cysts     left side  . Stroke ([place])     OCULAR   LEFT  EYE    LAST YR.  Marland Kitchen Hypothyroidism   . Myocardial infarction ([place])     8 yrs ago.   . Sleep apnea     uses CPAP, 3   Past Surgical History  Procedure Laterality Date  . Knee surgery Bilateral   . Cholecystectomy    . Tonsillectomy    . Cesarean section      x2  . Cervical spine surgery    . Cardiac catheterization    . Abdominal hysterectomy    . Breast biopsy      left-non cancerous  . Cataract extraction w/phaco  09/21/2011    Procedure: CATARACT EXTRACTION PHACO AND INTRAOCULAR LENS PLACEMENT (IOC);  Surgeon: Williams Che, MD;  Location: [facility];  Service: Ophthalmology;  Laterality: Left;  CDE:9.78  . Total knee arthroplasty Right 05/24/2013    DR MURPHY  . Total knee arthroplasty Right 05/24/2013    Procedure: TOTAL KNEE ARTHROPLASTY;  Surgeon: [name], MD;  Location: [facility];  Service: Orthopedics;  Laterality: Right;  . Esophagogastroduodenoscopy  2006    Dr. Gala Romney: Subtle Schatzkis ring, otherwise normal upper GI tract, aside from a small pyloric channell erosion, status post dilation as described above.   . Colonoscopy  2006    RMR: 1. Internal hemorroids, otherwise normal  rectum. 2. Pedunculated polyp at 35 cm. reomved with snare. The remainder of teh colonic mucosa appeared normal.   . Colonoscopy  2011    Dr. Gala Romney: multiple ascending colon polyps and rectal polyp, adenomatous  . Colonoscopy N/A 05/31/2014    Procedure: COLONOSCOPY;  Surgeon: Daneil Dolin, MD;  Location: [facility];  Service: Endoscopy;  Laterality: N/A;  130  . Back surgery      x3,cervical and lunmbar, disc.  . Cataract extraction w/phaco Right 10/15/2014    Procedure: CATARACT EXTRACTION PHACO AND INTRAOCULAR LENS PLACEMENT (IOC);  Surgeon: Williams Che, MD;  Location: [facility];  Service: Ophthalmology;  Laterality: Right;  CDE:4.19   Family History  Problem Relation Age of Onset  . Colon cancer Neg Hx    Social History  Substance Use Topics  . Smoking status: Former Smoker --  0.50 packs/day for 10 years    Types: Cigarettes    Quit date: 04/21/1987  . Smokeless tobacco: Never Used  . Alcohol Use: 1.2 oz/week    2 Glasses of wine per week   OB History    No data available     Review of Systems 10/14 systems reviewed and are negative other than those stated in the HPI.    Allergies  Sulfa antibiotics and Penicillins  Home Medications   Prior to Admission medications   Medication Sig Start Date End Date Taking? Authorizing Provider  albuterol (PROVENTIL HFA;VENTOLIN HFA) 108 (90 BASE) MCG/ACT inhaler Inhale 2 puffs into the lungs every 6 (six) hours as needed for shortness of breath.    Yes Historical Provider, MD  albuterol (PROVENTIL) (2.5 MG/3ML) 0.083% nebulizer solution Take 2.5 mg by nebulization 3 (three) times daily.   Yes Historical Provider, MD  alprazolam Duanne Moron) 2 MG tablet Take 2 mg by mouth 3 (three) times daily as needed for anxiety.    Yes Historical Provider, MD  aspirin EC 325 MG tablet Take 1 tablet (325 mg total) by mouth daily. 05/25/13  Yes Aundra Dubin, PA-C  aspirin EC 81 MG tablet Take 81 mg by mouth daily.   Yes Historical Provider, MD  Carboxymethylcellulose Sodium (THERATEARS) 0.25 % SOLN Apply 1 drop to eye daily.   Yes Historical Provider, MD  esomeprazole (NEXIUM) 40 MG capsule Take 40 mg by mouth daily as needed (heartburn). For heartburn   Yes Historical Provider, MD  levothyroxine (LEVOTHROID) 50 MCG tablet Take 50 mcg by mouth daily before breakfast.   Yes Historical Provider, MD  methocarbamol (ROBAXIN) 500 MG tablet Take 1 tablet (500 mg total) by mouth 4 (four) times daily. Patient taking differently: Take 500 mg by mouth every 6 (six) hours as needed for muscle spasms.  05/25/13  Yes Aundra Dubin, PA-C  metoCLOPramide (REGLAN) 5 MG tablet Take 5 mg by mouth every other day. Takes only rarely   Yes Historical Provider, MD  mometasone (NASONEX) 50 MCG/ACT nasal spray Place 2 sprays into the nose daily as needed  (allergies).    Yes Historical Provider, MD  Multiple Vitamin (MULTIVITAMIN WITH MINERALS) TABS Take 1 tablet by mouth daily.   Yes Historical Provider, MD  oxyCODONE-acetaminophen (ROXICET) 5-325 MG per tablet Take 1-2 tablets by mouth every 4 (four) hours as needed. 05/25/13  Yes Aundra Dubin, PA-C  peg 3350 powder (MOVIPREP) 100 G SOLR Take 1 kit (200 g total) by mouth as directed. 05/14/14  Yes Daneil Dolin, MD  potassium chloride SA (K-DUR,KLOR-CON) 20 MEQ tablet Take 20 mEq  by mouth daily.   Yes Historical Provider, MD  simvastatin (ZOCOR) 10 MG tablet Take 10 mg by mouth daily.   Yes Historical Provider, MD  torsemide (DEMADEX) 20 MG tablet Take 40 mg by mouth daily.   Yes Historical Provider, MD  Vitamin D, Ergocalciferol, (DRISDOL) 50000 UNITS CAPS Take 50,000 Units by mouth 2 (two) times a week. On Tuesday and Thursday   Yes Historical Provider, MD   BP 115/58 mmHg  Pulse 73  Temp(Src) 98 F (36.7 C) (Oral)  Resp 18  Ht '5\' 1"'  (1.549 m)  Wt 231 lb 14.8 oz (105.2 kg)  BMI 43.84 kg/m2  SpO2 96% Physical Exam Physical Exam  Nursing note and vitals reviewed. Constitutional: Well developed, well nourished, non-toxic, and in no acute distress Head: Normocephalic and atraumatic.  Mouth/Throat: Oropharynx is clear and moist.  Neck: Cardiovascular: Normal rate and regular rhythm.   Pulmonary/Chest: Effort normal and breath sounds normal.  Abdominal: Soft. There is no tenderness. There is no rebound and no guarding.  Musculoskeletal: Normal range of motion.  Neurological:  Alert, oriented to person, place, time, and situation. Memory grossly in tact. Fluent speech. No dysarthria or aphasia.  Cranial nerves: VF are full.. Pupils are symmetric, and reactive to light. EOMI without nystagmus. No gaze deviation. Facial muscles symmetric with activation. Sensation to light touch over face diminished on the left side. Hearing grossly in tact. Palate elevates symmetrically. Head turn and  shoulder shrug are intact. Tongue midline.  Muscle bulk and tone normal. Mild left sided pronator drift. Drift with left lower extremity against gravity Sensation to light touch is diminished in the LUE and LLE Coordination reveals left sided dysmetria with finger to nose. Gait is narrow-based and steady. Unable to perform tandem gaot Skin: Skin is warm and dry.  Psychiatric: Cooperative   ED Course  Procedures (including critical care time) DIAGNOSTIC STUDIES: Oxygen Saturation is 100% on RA, normal by my interpretation.    COORDINATION OF CARE: 9:27 PM Discussed treatment plan with pt at bedside and pt agreed to plan.   Labs Review Labs Reviewed  CBC WITH DIFFERENTIAL/PLATELET - Abnormal; Notable for the following:    WBC 10.8 (*)    Lymphs Abs 4.8 (*)    All other components within normal limits  COMPREHENSIVE METABOLIC PANEL - Abnormal; Notable for the following:    Potassium 3.4 (*)    Glucose, Bld 113 (*)    Creatinine, Ser 1.44 (*)    AST 13 (*)    ALT 11 (*)    Total Bilirubin 0.2 (*)    GFR calc non Af Amer 36 (*)    GFR calc Af Amer 42 (*)    All other components within normal limits  URINALYSIS, ROUTINE W REFLEX MICROSCOPIC (NOT AT [facility]) - Abnormal; Notable for the following:    Hgb urine dipstick SMALL (*)    All other components within normal limits  TSH - Abnormal; Notable for the following:    TSH 0.294 (*)    All other components within normal limits  CBG MONITORING, ED - Abnormal; Notable for the following:    Glucose-Capillary 106 (*)    All other components within normal limits  BRAIN NATRIURETIC PEPTIDE  SEDIMENTATION RATE  TROPONIN I  URINE MICROSCOPIC-ADD ON  C-REACTIVE PROTEIN    Imaging Review Dg Chest 2 View  12/29/2014  CLINICAL DATA:  Left-sided numbness radiating into the left upper extremity and left side of the head for 4 days. EXAM: CHEST  2 VIEW COMPARISON:  PA and lateral chest 08/09/2013. FINDINGS: The lungs are clear. Heart size is  mildly enlarged. No pneumothorax or pleural effusion. Thoracic spondylosis and severe degenerative disease about the left shoulder noted. IMPRESSION: No acute disease. Electronically Signed   By: Inge Rise M.D.   On: 12/29/2014 22:21   Ct Head Wo Contrast  12/29/2014  CLINICAL DATA:  Left-sided headache with left-sided weakness and numbness since Wednesday. EXAM: CT HEAD WITHOUT CONTRAST TECHNIQUE: Contiguous axial images were obtained from the base of the skull through the vertex without intravenous contrast. COMPARISON:  None currently available FINDINGS: Skull and Sinuses:Negative for fracture or destructive process. The mastoids, middle ears, and imaged paranasal sinuses are clear. Orbits: No acute abnormality. Brain: No evidence of acute infarction, hemorrhage, hydrocephalus, or mass lesion/mass effect. There is patchy low-density in the bilateral cerebral white matter, most confluent in the frontal regions. Findings most consistent with chronic small vessel disease (patient has history of hypertension, COPD, and coronary artery disease), which was also reported on brain MRI from 2013. Normal cerebral volume. IMPRESSION: 1. No acute finding. 2. Chronic microvascular ischemia. Electronically Signed   By: Monte Fantasia M.D.   On: 12/29/2014 22:29   I have personally reviewed and evaluated these images and lab results as part of my medical decision-making.   EKG Interpretation   Date/Time:  Saturday December 29 2014 20:01:27 EST Ventricular Rate:  93 PR Interval:  188 QRS Duration: 82 QT Interval:  358 QTC Calculation: 445 R Axis:   -40 Text Interpretation:  Normal sinus rhythm Left axis deviation Abnormal ECG  No significant change since last tracing Confirmed by LIU MD, Hinton Dyer ([id number])  on 12/29/2014 9:59:00 PM      MDM   Final diagnoses:  CVA (cerebral infarction)  Cerebral infarction due to unspecified mechanism    69 year old female with history of CAD, HTN, HLD, and prior  CVA who presents with headache and left sided numbness/weakness for three days. C/f CVA given subtle diminished sensation and weakness on the left side on neuro exam. CT negative. She is also tender over the left temple and considered temporal arteritis. Inflammatory markers pending, but seems less likely given cortical neuro findings.   Spoke with Dr. Nicole Kindred from Neurology regarding suspicion for stroke. Given 3 days since onset of symptoms, there is no urgency in MRI imaging. Recommended admission to [facility] hospitalist service for PT and ongoing stroke management. Discussed with Dr. Marin Comment who will admit for further stroke assessment.  I personally performed the services described in this documentation, which was scribed in my presence. The recorded information has been reviewed and is accurate.   Forde Dandy, MD 12/30/14 1311

## 2014-12-30 ENCOUNTER — Inpatient Hospital Stay (HOSPITAL_COMMUNITY): Payer: Medicare Other

## 2014-12-30 ENCOUNTER — Encounter (HOSPITAL_COMMUNITY): Payer: Self-pay | Admitting: Internal Medicine

## 2014-12-30 DIAGNOSIS — R296 Repeated falls: Secondary | ICD-10-CM | POA: Diagnosis present

## 2014-12-30 DIAGNOSIS — I6350 Cerebral infarction due to unspecified occlusion or stenosis of unspecified cerebral artery: Secondary | ICD-10-CM

## 2014-12-30 DIAGNOSIS — Z87891 Personal history of nicotine dependence: Secondary | ICD-10-CM | POA: Diagnosis not present

## 2014-12-30 DIAGNOSIS — K219 Gastro-esophageal reflux disease without esophagitis: Secondary | ICD-10-CM | POA: Diagnosis present

## 2014-12-30 DIAGNOSIS — I69354 Hemiplegia and hemiparesis following cerebral infarction affecting left non-dominant side: Secondary | ICD-10-CM | POA: Diagnosis not present

## 2014-12-30 DIAGNOSIS — E785 Hyperlipidemia, unspecified: Secondary | ICD-10-CM | POA: Diagnosis present

## 2014-12-30 DIAGNOSIS — E039 Hypothyroidism, unspecified: Secondary | ICD-10-CM | POA: Diagnosis present

## 2014-12-30 DIAGNOSIS — I509 Heart failure, unspecified: Secondary | ICD-10-CM | POA: Diagnosis present

## 2014-12-30 DIAGNOSIS — Z9114 Patient's other noncompliance with medication regimen: Secondary | ICD-10-CM | POA: Diagnosis not present

## 2014-12-30 DIAGNOSIS — R519 Headache, unspecified: Secondary | ICD-10-CM | POA: Diagnosis present

## 2014-12-30 DIAGNOSIS — I252 Old myocardial infarction: Secondary | ICD-10-CM | POA: Diagnosis not present

## 2014-12-30 DIAGNOSIS — I639 Cerebral infarction, unspecified: Secondary | ICD-10-CM

## 2014-12-30 DIAGNOSIS — Z7982 Long term (current) use of aspirin: Secondary | ICD-10-CM | POA: Diagnosis not present

## 2014-12-30 DIAGNOSIS — Z96651 Presence of right artificial knee joint: Secondary | ICD-10-CM | POA: Diagnosis present

## 2014-12-30 DIAGNOSIS — M1990 Unspecified osteoarthritis, unspecified site: Secondary | ICD-10-CM | POA: Diagnosis present

## 2014-12-30 DIAGNOSIS — I10 Essential (primary) hypertension: Secondary | ICD-10-CM

## 2014-12-30 DIAGNOSIS — G43909 Migraine, unspecified, not intractable, without status migrainosus: Secondary | ICD-10-CM | POA: Diagnosis present

## 2014-12-30 DIAGNOSIS — R51 Headache: Secondary | ICD-10-CM

## 2014-12-30 DIAGNOSIS — Z23 Encounter for immunization: Secondary | ICD-10-CM | POA: Diagnosis not present

## 2014-12-30 DIAGNOSIS — I110 Hypertensive heart disease with heart failure: Secondary | ICD-10-CM | POA: Diagnosis present

## 2014-12-30 DIAGNOSIS — J449 Chronic obstructive pulmonary disease, unspecified: Secondary | ICD-10-CM | POA: Diagnosis present

## 2014-12-30 DIAGNOSIS — I2510 Atherosclerotic heart disease of native coronary artery without angina pectoris: Secondary | ICD-10-CM | POA: Diagnosis present

## 2014-12-30 HISTORY — DX: Cerebral infarction, unspecified: I63.9

## 2014-12-30 LAB — C-REACTIVE PROTEIN: CRP: 1.3 mg/dL — ABNORMAL HIGH (ref <1.0)

## 2014-12-30 LAB — TSH: TSH: 0.294 u[IU]/mL — ABNORMAL LOW (ref 0.350–4.500)

## 2014-12-30 MED ORDER — ONDANSETRON HCL 4 MG PO TABS: 4.0000 mg | ORAL_TABLET | Freq: Three times a day (TID) | ORAL | Status: DC | PRN

## 2014-12-30 MED ORDER — SODIUM CHLORIDE 0.9 % IJ SOLN
3.0000 mL | Freq: Two times a day (BID) | INTRAMUSCULAR | Status: DC
  Administered 2014-12-30 – 2014-12-31 (×3): 3 mL via INTRAVENOUS

## 2014-12-30 MED ORDER — PANTOPRAZOLE SODIUM 40 MG PO PACK
40.0000 mg | PACK | Freq: Every day | ORAL | Status: DC
  Administered 2014-12-30 – 2014-12-31 (×2): 40 mg via ORAL
  Filled 2014-12-30 (×4): qty 20

## 2014-12-30 MED ORDER — POTASSIUM CHLORIDE CRYS ER 20 MEQ PO TBCR
20.0000 meq | EXTENDED_RELEASE_TABLET | Freq: Every day | ORAL | Status: DC
  Administered 2014-12-30 – 2014-12-31 (×2): 20 meq via ORAL
  Filled 2014-12-30 (×2): qty 1

## 2014-12-30 MED ORDER — HYDROMORPHONE HCL 1 MG/ML IJ SOLN
0.5000 mg | INTRAMUSCULAR | Status: DC | PRN
  Administered 2014-12-30 – 2014-12-31 (×3): 0.5 mg via INTRAVENOUS
  Filled 2014-12-30 (×3): qty 1

## 2014-12-30 MED ORDER — ENOXAPARIN SODIUM 40 MG/0.4ML ~~LOC~~ SOLN
40.0000 mg | SUBCUTANEOUS | Status: DC
  Administered 2014-12-30 – 2014-12-31 (×2): 40 mg via SUBCUTANEOUS
  Filled 2014-12-30 (×2): qty 0.4

## 2014-12-30 MED ORDER — ALBUTEROL SULFATE (2.5 MG/3ML) 0.083% IN NEBU: 3.0000 mL | INHALATION_SOLUTION | Freq: Four times a day (QID) | RESPIRATORY_TRACT | Status: DC | PRN

## 2014-12-30 MED ORDER — SIMVASTATIN 10 MG PO TABS
10.0000 mg | ORAL_TABLET | Freq: Every day | ORAL | Status: DC
  Administered 2014-12-30 – 2014-12-31 (×2): 10 mg via ORAL
  Filled 2014-12-30 (×2): qty 1

## 2014-12-30 MED ORDER — ALBUTEROL SULFATE (2.5 MG/3ML) 0.083% IN NEBU
2.5000 mg | INHALATION_SOLUTION | Freq: Three times a day (TID) | RESPIRATORY_TRACT | Status: DC
  Administered 2014-12-30 – 2014-12-31 (×4): 2.5 mg via RESPIRATORY_TRACT
  Filled 2014-12-30 (×4): qty 3

## 2014-12-30 MED ORDER — TORSEMIDE 20 MG PO TABS
40.0000 mg | ORAL_TABLET | Freq: Every day | ORAL | Status: DC
  Administered 2014-12-30 – 2014-12-31 (×2): 40 mg via ORAL
  Filled 2014-12-30 (×2): qty 2

## 2014-12-30 MED ORDER — INFLUENZA VAC SPLIT QUAD 0.5 ML IM SUSY: 0.5000 mL | PREFILLED_SYRINGE | INTRAMUSCULAR | Status: DC

## 2014-12-30 MED ORDER — FLUTICASONE PROPIONATE 50 MCG/ACT NA SUSP
1.0000 | Freq: Every day | NASAL | Status: DC
  Administered 2014-12-30 – 2014-12-31 (×2): 1 via NASAL
  Filled 2014-12-30: qty 16

## 2014-12-30 MED ORDER — LEVOTHYROXINE SODIUM 50 MCG PO TABS
50.0000 ug | ORAL_TABLET | Freq: Every day | ORAL | Status: DC
  Administered 2014-12-30 – 2014-12-31 (×2): 50 ug via ORAL
  Filled 2014-12-30 (×2): qty 1

## 2014-12-30 MED ORDER — ALPRAZOLAM 1 MG PO TABS
2.0000 mg | ORAL_TABLET | Freq: Three times a day (TID) | ORAL | Status: DC | PRN
  Administered 2014-12-30: 2 mg via ORAL
  Filled 2014-12-30: qty 2

## 2014-12-30 NOTE — Evaluation (Signed)
Physical Therapy Evaluation Patient Details Name: Holly Flores MRN: [id number] DOB: 1945-03-26 Today's Date: 12/30/2014   History of Present Illness  Holly Flores is a 69 y.o. female who is right hand dominant with h/o CHF, left-sided stroke, HTN, GERD, COPD, HLD, MI. hypothyroidism who presents to the Emergency Department complaining of moderate, persistent, waxing and waning left-sided temporal HA onset 3 days ago (10/10 at onset) that woke her from her sleep. She states associated flashing lights in the eyes, left-sided arm and leg numbness, tingling and weakness in the LUE and LLE, nausea and emesis today. She also complains of CP and pressure onset today at 4PM, SOB, increased leg swelling for 3 days. Pt states that the left-sided numbness and tingling began shortly after her HA. Pt states she took 2 NTG with relief of CP. She also notes she has taken pain medication with temporary relief of HA. Per husband, she also experienced 15 minutes of slurred speech 3 days ago. She reports weakness and numbness in the leg at baseline for 6 months that has worsened in the last week. She also states that she has been falling frequently this week secondary to worsened weakness in her left leg. No h/o similar HA. H/o similar CP and SOB due to CHF. She states that she has gained 5 pounds in the past 3 days. She notes that she has increased her dosage of her fluid pills in the past 3 days. She denies right-sided HA, weakness, numbness or tingling in the RUE or RLE. Pt reports PMHx postiive for L TKA, 3 lumbar spine surgeries, and anterior cervical fusion.    Clinical Impression  Pt received semirecumbent in bed upon arrival with family in room, no acute distress noted. Pt demonstrating strength, balance, sensation, coordinate, and mobility all within functional limits and at baseline. Questions regarding HA do not clearly indicate and musculoskeletal or cervicogenic origins at this time, pt denying any  changes to movement and reporting some mild photophobia. Pt reports that HA is an intermittent problem, however never lasting longer than 24 hours. Pt reports she can feel them come on with pain in paresthesia in the L jaw prior to the onset of HA. Pt presenting at functional baseline with no acute changes that can be associated with typical CVA presentation. PT signing of at this time. No additional skilled PT services recommended at this time and pt/family is agreeable.       Follow Up Recommendations No PT follow up    Equipment Recommendations  None recommended by PT    Recommendations for Other Services       Precautions / Restrictions Precautions Precautions: None Restrictions Weight Bearing Restrictions: No      Mobility  Bed Mobility Overal bed mobility: Modified Independent                Transfers Overall transfer level: Modified independent Equipment used: None                Ambulation/Gait Ambulation/Gait assistance: Min guard Ambulation Distance (Feet): 100 Feet Assistive device: None Gait Pattern/deviations: WFL(Within Functional Limits)   Gait velocity interpretation: <1.8 ft/sec, indicative of risk for recurrent falls General Gait Details: decreased contribution from LLE, however pt reports this ti be at baseline for mobility.   Stairs            Wheelchair Mobility    Modified Rankin (Stroke Patients Only)       Balance Overall balance assessment: No apparent balance deficits (not  formally assessed);History of Falls (1 fall in 6 months related to LLE giving out on her. )                                           Pertinent Vitals/Pain Pain Assessment:  (Continued HA since arrival, L temporomandibular. )    Home Living Family/patient expects to be discharged to:: Private residence Living Arrangements: Spouse/significant other Available Help at Discharge: Family Type of Home: House Home Access: Level  entry;Stairs to enter Entrance Stairs-Rails: None Entrance Stairs-Number of Steps: 2 Home Layout: One level Home Equipment: Cane - single point;Walker - 4 wheels      Prior Function Level of Independence: Independent with assistive device(s)         Comments: Ambulating limited community distances only, due to chronic N/T in LLE that worsens with prolonged weight bearing.       Hand Dominance   Dominant Hand: Right    Extremity/Trunk Assessment   Upper Extremity Assessment: Overall WFL for tasks assessed (sensation WNL. )           Lower Extremity Assessment: Overall WFL for tasks assessed;LLE deficits/detail   LLE Deficits / Details: MMT 4/5 in left ankle and knee.      Communication   Communication: No difficulties  Cognition Arousal/Alertness: Awake/alert Behavior During Therapy: WFL for tasks assessed/performed Overall Cognitive Status: Within Functional Limits for tasks assessed                      General Comments      Exercises        Assessment/Plan    PT Assessment Patent does not need any further PT services  PT Diagnosis     PT Problem List    PT Treatment Interventions     PT Goals (Current goals can be found in the Care Plan section) Acute Rehab PT Goals PT Goal Formulation: All assessment and education complete, DC therapy    Frequency     Barriers to discharge        Co-evaluation               End of Session Equipment Utilized During Treatment: Gait belt Activity Tolerance: Patient tolerated treatment well;No increased pain Patient left: in bed;with family/visitor present Nurse Communication: Mobility status;Other (comment)         TimeUI:[id number] PT Time Calculation (min) (ACUTE ONLY): 18 min   Charges:   PT Evaluation $Initial PT Evaluation Tier I: 1 Procedure     PT G Codes:        Kadarious Dikes C 01-27-15, 12:15 PM 12:19 PM  Etta Grandchild, PT, DPT [place] License # [id number]

## 2014-12-30 NOTE — H&P (Signed)
[facility] History and Physical  Holly Flores RCV:[id number] DOB: [birth year]    PCP:   Octavio Graves, DO   Chief Complaint: left sided headache, and left sided weakness.   HPI: Holly Flores is an 69 y.o. 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  She had no slurred speech, facial droop, but had mild nausea and no vomiting.  She has had intermittent HA in the past, but never told she has migraine.  In the ER, evaluation showed negative head CT, normal EKG,  Negative troponin, Cr of 1.44, K of 3.4, and unremarkable serologies.  Her ESR was normal, and CRP was pending.  Hospitalist was asked to admit her for stroke work up.   Rewiew of Systems:  Constitutional: Negative for malaise, fever and chills. No significant weight loss or weight gain Eyes: Negative for eye pain, redness and discharge, diplopia, visual changes, or flashes of light. ENMT: Negative for ear pain, hoarseness, nasal congestion, sinus pressure and sore throat. No headaches; tinnitus, drooling, or problem swallowing. Cardiovascular: Negative for chest pain, palpitations, diaphoresis, dyspnea and peripheral edema. ; No orthopnea, PND Respiratory: Negative for cough, hemoptysis, wheezing and stridor. No pleuritic chestpain. Gastrointestinal: Negative for nausea, vomiting, diarrhea, constipation, abdominal pain, melena, blood in stool, hematemesis, jaundice and rectal bleeding.    Genitourinary: Negative for frequency, dysuria, incontinence,flank pain and hematuria; Musculoskeletal: Negative for back pain and neck pain. Negative for swelling and trauma.;  Skin: . Negative for pruritus, rash, abrasions, bruising and skin lesion.; ulcerations Neuro: Negative for headache, lightheadedness and neck stiffness. Negative for weakness,  altered level of consciousness , altered mental status, extremity weakness, burning feet, involuntary movement, seizure and syncope.  Psych: negative for anxiety, depression, insomnia, tearfulness, panic attacks, hallucinations, paranoia, suicidal or homicidal ideation    Past Medical History  Diagnosis Date  . CHF (congestive heart failure) ([place])   . Arthritis   . Stroke ([place])   . Hypertension   . GERD (gastroesophageal reflux disease)   . COPD (chronic obstructive pulmonary disease) ([place])   . Shortness of breath   . Anxiety   . Depression   . Hyperlipidemia   . Goiter   . Kidney cysts     left side  . Stroke ([place])     OCULAR  LEFT  EYE    LAST YR.  Marland Kitchen Hypothyroidism   . Myocardial infarction ([place])     8 yrs ago.   . Sleep apnea     uses CPAP, 3    Past Surgical History  Procedure Laterality Date  . Knee surgery Bilateral   . Cholecystectomy    . Tonsillectomy    . Cesarean section      x2  . Cervical spine surgery    . Cardiac catheterization    . Abdominal hysterectomy    . Breast biopsy      left-non cancerous  . Cataract extraction w/phaco  09/21/2011    Procedure: CATARACT EXTRACTION PHACO AND INTRAOCULAR LENS PLACEMENT (IOC);  Surgeon: Williams Che, MD;  Location: [facility];  Service: Ophthalmology;  Laterality: Left;  CDE:9.78  . Total knee arthroplasty Right 05/24/2013    DR MURPHY  . Total knee arthroplasty Right 05/24/2013    Procedure: TOTAL KNEE ARTHROPLASTY;  Surgeon: [name], MD;  Location: [facility];  Service: Orthopedics;  Laterality: Right;  . Esophagogastroduodenoscopy  2006    Dr. Gala Romney: Subtle Schatzkis ring, otherwise normal upper GI tract, aside from a small pyloric channell erosion, status post dilation as described above.   . Colonoscopy  2006    RMR: 1. Internal hemorroids, otherwise normal rectum. 2. Pedunculated polyp at 35 cm. reomved with snare. The remainder of teh colonic mucosa appeared normal.   . Colonoscopy  2011    Dr. Gala Romney:  multiple ascending colon polyps and rectal polyp, adenomatous  . Colonoscopy N/A 05/31/2014    Procedure: COLONOSCOPY;  Surgeon: Daneil Dolin, MD;  Location: [facility];  Service: Endoscopy;  Laterality: N/A;  130  . Back surgery      x3,cervical and lunmbar, disc.  . Cataract extraction w/phaco Right 10/15/2014    Procedure: CATARACT EXTRACTION PHACO AND INTRAOCULAR LENS PLACEMENT (IOC);  Surgeon: Williams Che, MD;  Location: [facility];  Service: Ophthalmology;  Laterality: Right;  CDE:4.19    Medications:  HOME MEDS: Prior to Admission medications   Medication Sig Start Date End Date Taking? Authorizing Provider  albuterol (PROVENTIL HFA;VENTOLIN HFA) 108 (90 BASE) MCG/ACT inhaler Inhale 2 puffs into the lungs every 6 (six) hours as needed for shortness of breath.     Historical Provider, MD  albuterol (PROVENTIL) (2.5 MG/3ML) 0.083% nebulizer solution Take 2.5 mg by nebulization 3 (three) times daily.    Historical Provider, MD  alprazolam Duanne Moron) 2 MG tablet Take 2 mg by mouth 3 (three) times daily as needed for anxiety.     Historical Provider, MD  aspirin EC 325 MG tablet Take 1 tablet (325 mg total) by mouth daily. Patient not taking: Reported on 10/08/2014 05/25/13   Aundra Dubin, PA-C  aspirin EC 81 MG tablet Take 81 mg by mouth daily.    Historical Provider, MD  Carboxymethylcellulose Sodium (THERATEARS) 0.25 % SOLN Apply 1 drop to eye daily.    Historical Provider, MD  esomeprazole (NEXIUM) 40 MG capsule Take 40 mg by mouth daily as needed (heartburn). For heartburn    Historical Provider, MD  levothyroxine (LEVOTHROID) 50 MCG tablet Take 50 mcg by mouth daily before breakfast.    Historical Provider, MD  methocarbamol (ROBAXIN) 500 MG tablet Take 1 tablet (500 mg total) by mouth 4 (four) times daily. Patient taking differently: Take 500 mg by mouth every 6 (six) hours as needed for muscle spasms.  05/25/13   Aundra Dubin, PA-C  metoCLOPramide (REGLAN) 5 MG tablet Take 5 mg  by mouth every other day. Takes only rarely    Historical Provider, MD  mometasone (NASONEX) 50 MCG/ACT nasal spray Place 2 sprays into the nose daily as needed (allergies).     Historical Provider, MD  Multiple Vitamin (MULTIVITAMIN WITH MINERALS) TABS Take 1 tablet by mouth daily.    Historical Provider, MD  ondansetron (ZOFRAN) 4 MG tablet Take 1 tablet (4 mg total) by mouth every 8 (eight) hours as needed for nausea or vomiting. 05/25/13   Aundra Dubin, PA-C  oxyCODONE-acetaminophen (ROXICET) 5-325 MG per tablet Take 1-2 tablets by mouth every 4 (four) hours as needed. 05/25/13   Aundra Dubin, PA-C  peg 3350 powder (MOVIPREP) 100 G SOLR Take 1 kit (200 g total) by mouth as directed. 05/14/14   Daneil Dolin, MD  potassium chloride SA (K-DUR,KLOR-CON) 20 MEQ tablet Take 20 mEq by mouth daily.    Historical Provider, MD  simvastatin (ZOCOR) 10 MG tablet Take 10 mg  by mouth daily.    Historical Provider, MD  torsemide (DEMADEX) 20 MG tablet Take 40 mg by mouth daily.    Historical Provider, MD  Vitamin D, Ergocalciferol, (DRISDOL) 50000 UNITS CAPS Take 50,000 Units by mouth 2 (two) times a week. On Tuesday and Thursday    Historical Provider, MD     Allergies:  Allergies  Allergen Reactions  . Sulfa Antibiotics Swelling    Tongue swelling  . Penicillins Hives, Itching and Rash    Social History:   reports that she quit smoking about 27 years ago. Her smoking use included Cigarettes. She has a 5 pack-year smoking history. She has never used smokeless tobacco. She reports that she drinks about 1.2 oz of alcohol per week. She reports that she does not use illicit drugs.  Family History: Family History  Problem Relation Age of Onset  . Colon cancer Neg Hx      Physical Exam: Filed Vitals:   12/29/14 1957  BP: 122/85  Pulse: 83  Temp: 98.2 F (36.8 C)  TempSrc: Oral  Resp: 20  Height: $Remove'5\' 1"'EsshIJD$  (1.549 m)  Weight: 101.606 kg (224 lb)  SpO2: 100%   Blood pressure 122/85, pulse 83,  temperature 98.2 F (36.8 C), temperature source Oral, resp. rate 20, height $RemoveBe'5\' 1"'GhkTmGnWy$  (1.549 m), weight 101.606 kg (224 lb), SpO2 100 %.  GEN:  Pleasant patient lying in the stretcher in no acute distress; cooperative with exam. PSYCH:  alert and oriented x4; does not appear anxious or depressed; affect is appropriate. HEENT: Mucous membranes pink and anicteric; PERRLA; EOM intact; no cervical lymphadenopathy nor thyromegaly or carotid bruit; no JVD; There were no stridor. Neck is very supple. Tender over the left temple area, but no cord palpable.  Breasts:: Not examined CHEST WALL: No tenderness CHEST: Normal respiration, clear to auscultation bilaterally.  HEART: Regular rate and rhythm.  There are no murmur, rub, or gallops.   BACK: No kyphosis or scoliosis; no CVA tenderness ABDOMEN: soft and non-tender; no masses, no organomegaly, normal abdominal bowel sounds; no pannus; no intertriginous candida. There is no rebound and no distention. Rectal Exam: Not done EXTREMITIES: No bone or joint deformity; age-appropriate arthropathy of the hands and knees; no edema; no ulcerations.  There is no calf tenderness. Genitalia: not examined PULSES: 2+ and symmetric SKIN: Normal hydration no rash or ulceration CNS: Cranial nerves 2-12 grossly intact no focal lateralizing neurologic deficit.  Speech is fluent; uvula elevated with phonation, facial symmetry and tongue midline. DTR are normal bilaterally, cerebella exam is intact, barbinski is negative No sensory loss.  Slightly weaker on the left side.    Labs on Admission:  Basic Metabolic Panel:  Recent Labs Lab 12/29/14 2130  NA 142  K 3.4*  CL 103  CO2 31  GLUCOSE 113*  BUN 16  CREATININE 1.44*  CALCIUM 9.0   Liver Function Tests:  Recent Labs Lab 12/29/14 2130  AST 13*  ALT 11*  ALKPHOS 87  BILITOT 0.2*  PROT 7.2  ALBUMIN 3.7   CBC:  Recent Labs Lab 12/29/14 2130  WBC 10.8*  NEUTROABS 4.8  HGB 13.7  HCT 41.6  MCV 92.2   PLT 250   Cardiac Enzymes:  Recent Labs Lab 12/29/14 2130  TROPONINI <0.03    CBG:  Recent Labs Lab 12/29/14 2304  GLUCAP 106*     Radiological Exams on Admission: Dg Chest 2 View  12/29/2014  CLINICAL DATA:  Left-sided numbness radiating into the left upper extremity and left  side of the head for 4 days. EXAM: CHEST  2 VIEW COMPARISON:  PA and lateral chest 08/09/2013. FINDINGS: The lungs are clear. Heart size is mildly enlarged. No pneumothorax or pleural effusion. Thoracic spondylosis and severe degenerative disease about the left shoulder noted. IMPRESSION: No acute disease. Electronically Signed   By: Inge Rise M.D.   On: 12/29/2014 22:21   Ct Head Wo Contrast  12/29/2014  CLINICAL DATA:  Left-sided headache with left-sided weakness and numbness since Wednesday. EXAM: CT HEAD WITHOUT CONTRAST TECHNIQUE: Contiguous axial images were obtained from the base of the skull through the vertex without intravenous contrast. COMPARISON:  None currently available FINDINGS: Skull and Sinuses:Negative for fracture or destructive process. The mastoids, middle ears, and imaged paranasal sinuses are clear. Orbits: No acute abnormality. Brain: No evidence of acute infarction, hemorrhage, hydrocephalus, or mass lesion/mass effect. There is patchy low-density in the bilateral cerebral white matter, most confluent in the frontal regions. Findings most consistent with chronic small vessel disease (patient has history of hypertension, COPD, and coronary artery disease), which was also reported on brain MRI from 2013. Normal cerebral volume. IMPRESSION: 1. No acute finding. 2. Chronic microvascular ischemia. Electronically Signed   By: Monte Fantasia M.D.   On: 12/29/2014 22:29   Assessment/Plan Present on Admission:  . HA (headache) . Hyperlipidemia . CVA (cerebral vascular accident) ([place]) . CVA (cerebral infarction)  PLAN:  Will admit her for CVA work up.  WIll continue with her ASA, as  she has not been compliant with her meds.  I thought about temporal arteritis, and though she could have negative ESR TA, it is far less likely.  Will obtain MRI without contrast, ECHO and carotid US.  She is otherwise stable, and her home meds will be continued.  She is stable, full code, and will be admitted to [facility] service.  Thank you and Good Day.  Other plans as per orders.  Code Status  FULL Haskel Khan, MD. [facility] Pager [phone] 7pm to 7am.  12/30/2014, 12:13 AM

## 2014-12-30 NOTE — Progress Notes (Signed)
  Echocardiogram 2D Echocardiogram has been performed.  Aggie Cosier 12/30/2014, 11:35 AM

## 2014-12-30 NOTE — Plan of Care (Signed)
Problem: Education: Goal: Knowledge of [place] [facility] Education information/materials will improve Outcome: Progressing Discussed pt's treatment plan, room orientation, medications, etc.   Problem: Safety: Goal: Ability to remain free from injury will improve Outcome: Progressing Pt remains free of injury this shift.   Problem: Pain Managment: Goal: General experience of comfort will improve Outcome: Progressing Pt rated her headache as a 6 from a 10 previously. She states her headache has improved.

## 2014-12-30 NOTE — Progress Notes (Signed)
Patient seen and evaluated earlier this AM by my associate. Please refer to H and P for details regarding assessment and plan.  Currently undergoing work up. Pt states she has had history of migraines in the past.  Will plan on reassessing next am unless there is acute medical problem requiring reassessment.  Gen: Pt in nad, alert and awake CV: no cyanosis Pulm: no increased wob, no wheezes  Holly Flores

## 2014-12-31 ENCOUNTER — Inpatient Hospital Stay (HOSPITAL_COMMUNITY): Payer: Medicare Other

## 2014-12-31 DIAGNOSIS — R51 Headache: Secondary | ICD-10-CM

## 2014-12-31 DIAGNOSIS — G43909 Migraine, unspecified, not intractable, without status migrainosus: Secondary | ICD-10-CM | POA: Diagnosis not present

## 2014-12-31 DIAGNOSIS — E785 Hyperlipidemia, unspecified: Secondary | ICD-10-CM | POA: Diagnosis not present

## 2014-12-31 LAB — LIPID PANEL
CHOLESTEROL: 169 mg/dL (ref 0–200)
HDL: 34 mg/dL — ABNORMAL LOW (ref >40)
LDL CALC: 112 mg/dL — AB (ref 0–99)
Total CHOL/HDL Ratio: 5 RATIO
Triglycerides: 117 mg/dL (ref <150)
VLDL: 23 mg/dL (ref 0–40)

## 2014-12-31 NOTE — Progress Notes (Signed)
OT Screen Note  Patient Details Name: Holly Flores MRN: [id number] DOB: 22-Jun-1945   Cancelled Treatment:    Reason Eval/Treat Not Completed: OT screened, no needs identified, will sign off. OT order received. Patient chart reviewed. Patient was seen by Physical Therapist over weekend and is at baseline functioning at Independent level. No acute CVA was seen on MRI. Patient shows no signs of acute weakness. No OT services needed at this time. Thank you for referral.   Ailene Ravel, OTR/L,CBIS  [phone]  12/31/2014, 8:16 AM

## 2014-12-31 NOTE — Care Management Note (Signed)
Case Management Note  Patient Details  Name: Holly Flores MRN: [id number] Date of Birth: 06/09/1945  Subjective/Objective:                  Pt admitted with CVA/TIA workup. Pt is from home, lives with husband and is ind with ADL's. Pt has cane, walker and BSC. Pt has had previous CVA and has difficulty walking long distances when she leaves home. Pt ordered wheelchair. Pt has chosen [facility] for DME.   Action/Plan: Romualdo Bolk, of [facility], made aware of DME referral and will obtain pt info from chart. Pt will have wheelchair delivered to pt's room prior to DC. Pt discharging home today with self care. No further CM needs.   Expected Discharge Date:     12/31/2014             Expected Discharge Plan:  Home/Self Care  In-House Referral:  NA  Discharge planning Services  CM Consult  Post Acute Care Choice:  Durable Medical Equipment Choice offered to:  Patient  DME Arranged:  Wheelchair manual DME Agency:  [place]:    [employer]:     Status of Service:  Completed, signed off  Medicare Important Message Given:    Date Medicare IM Given:    Medicare IM give by:    Date Additional Medicare IM Given:    Additional Medicare Important Message give by:     If discussed at [place] of Stay Meetings, dates discussed:    Additional Comments:  Sherald Barge, RN 12/31/2014, 12:06 PM

## 2014-12-31 NOTE — Progress Notes (Signed)
AVS reviewed with patient.  Patient verbalized understanding of discharge instructions, physician follow-up, medications.  Patient's IV removed by patient.  Site WNL and catheter intact (located in trash).  Patient reports all belongings intact and in possession at time of discharge.  Patient transported by NT via w/c to main entrance for discharge.  Patient transported home by husband in private vehicle.  Patient stable at time of discharge.

## 2014-12-31 NOTE — Discharge Summary (Signed)
Physician Discharge Summary  DALL[place] PUPILLO Holly Flores DOB: 04-24-1945 DOA: 12/29/2014  PCP: Octavio Graves, DO  Admit date: 12/29/2014 Discharge date: 12/31/2014  Time spent: > 35 minutes  Recommendations for Outpatient Follow-up 1. Suspect pt had migraine headache as she reports a history of this a long time ago.  She should follow up with her primary care physician or neurologist for further evaluation and recommendations.   Discharge Diagnoses:  Active Problems:   Hyperlipidemia   HA (headache)   CVA (cerebral vascular accident) ([place])   CVA (cerebral infarction)   Discharge Condition: stable  Diet recommendation: Heart healthy diet  Filed Weights   12/29/14 1957 12/30/14 0500  Weight: 101.606 kg (224 lb) 105.2 kg (231 lb 14.8 oz)    History of present illness:  From original HPI: 69 y.o. 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 Did not report any worsening weakness of left side.\ - Evaluated by PT who did not recommend PT f/u on d/c  Patient to continue home medication regimen for known comorbidities  Procedures:  MRI of brain, CT of head, Echocardiogram, Carotid dopplers  Consultations:  None  Discharge Exam: Filed Vitals:   12/31/14 0512  BP: 108/81  Pulse: 94  Temp: 98.8 F (37.1 C)  Resp: 16    General: Pt in nad, alert and awake Cardiovascular: rrr, no mrg Respiratory: cta bl , no wheezes  Discharge Instructions   Discharge Instructions    Call MD for:  difficulty breathing, headache or  visual disturbances    Complete by:  As directed      Call MD for:  persistant dizziness or light-headedness    Complete by:  As directed      Call MD for:  severe uncontrolled pain    Complete by:  As directed      Call MD for:  temperature >100.4    Complete by:  As directed      Diet - low sodium heart healthy    Complete by:  As directed      Discharge instructions    Complete by:  As directed   When administering albuterol use either HFA or nebulizer but not both at the same time. Please read administration instructions should there be any doubts and/or ask her pharmacist.  Follow up with your primary care physician within the next one or 2 weeks     Increase activity slowly    Complete by:  As directed           Current Discharge Medication List    CONTINUE these medications which have NOT CHANGED   Details  albuterol (PROVENTIL HFA;VENTOLIN HFA) 108 (90 BASE) MCG/ACT inhaler Inhale 2 puffs into the lungs every 6 (six) hours as needed for shortness of breath.     albuterol (PROVENTIL) (2.5 MG/3ML) 0.083% nebulizer solution Take 2.5 mg by nebulization 3 (three) times daily.    alprazolam (XANAX) 2 MG tablet Take 2 mg by mouth 3 (three) times daily as needed for anxiety.     Carboxymethylcellulose Sodium (THERATEARS) 0.25 % SOLN  Apply 1 drop to eye daily.    DHA-EPA-Flaxseed Oil-Vitamin E (THERA TEARS NUTRITION PO) Take 1 drop by mouth 3 (three) times daily.    esomeprazole (NEXIUM) 40 MG capsule Take 40 mg by mouth daily as needed (heartburn). For heartburn    levothyroxine (LEVOTHROID) 50 MCG tablet Take 50 mcg by mouth daily before breakfast.    MEGARED OMEGA-3 KRILL OIL 500 MG CAPS Take 1 capsule by mouth daily.    methocarbamol (ROBAXIN) 500 MG tablet Take 1 tablet (500 mg total) by mouth 4 (four) times daily. Qty: 90 tablet, Refills: 0    metoCLOPramide (REGLAN) 5 MG tablet Take 5 mg by mouth every other day. Takes only rarely    mometasone (NASONEX) 50 MCG/ACT  nasal spray Place 2 sprays into the nose daily as needed (allergies).     Multiple Vitamin (MULTIVITAMIN WITH MINERALS) TABS Take 1 tablet by mouth daily.    naproxen sodium (ANAPROX) 220 MG tablet Take 440 mg by mouth daily as needed (pain).    oxyCODONE-acetaminophen (ROXICET) 5-325 MG per tablet Take 1-2 tablets by mouth every 4 (four) hours as needed. Qty: 60 tablet, Refills: 0    potassium chloride SA (K-DUR,KLOR-CON) 20 MEQ tablet Take 20 mEq by mouth daily.    simvastatin (ZOCOR) 10 MG tablet Take 10 mg by mouth daily.    torsemide (DEMADEX) 20 MG tablet Take 40 mg by mouth daily.    Vitamin D, Ergocalciferol, (DRISDOL) 50000 UNITS CAPS Take 50,000 Units by mouth 2 (two) times a week. On Tuesday and Thursday      STOP taking these medications     ondansetron (ZOFRAN) 4 MG tablet        Allergies  Allergen Reactions  . Sulfa Antibiotics Swelling    Tongue swelling  . Penicillins Hives, Itching and Rash      The results of significant diagnostics from this hospitalization (including imaging, microbiology, ancillary and laboratory) are listed below for reference.    Significant Diagnostic Studies: Dg Chest 2 View  12/29/2014  CLINICAL DATA:  Left-sided numbness radiating into the left upper extremity and left side of the head for 4 days. EXAM: CHEST  2 VIEW COMPARISON:  PA and lateral chest 08/09/2013. FINDINGS: The lungs are clear. Heart size is mildly enlarged. No pneumothorax or pleural effusion. Thoracic spondylosis and severe degenerative disease about the left shoulder noted. IMPRESSION: No acute disease. Electronically Signed   By: Inge Rise M.D.   On: 12/29/2014 22:21   Ct Head Wo Contrast  12/29/2014  CLINICAL DATA:  Left-sided headache with left-sided weakness and numbness since Wednesday. EXAM: CT HEAD WITHOUT CONTRAST TECHNIQUE: Contiguous axial images were obtained from the base of the skull through the vertex without intravenous contrast. COMPARISON:   None currently available FINDINGS: Skull and Sinuses:Negative for fracture or destructive process. The mastoids, middle ears, and imaged paranasal sinuses are clear. Orbits: No acute abnormality. Brain: No evidence of acute infarction, hemorrhage, hydrocephalus, or mass lesion/mass effect. There is patchy low-density in the bilateral cerebral white matter, most confluent in the frontal regions. Findings most consistent with chronic small vessel disease (patient has history of hypertension, COPD, and coronary artery disease), which was also reported on brain MRI from 2013. Normal cerebral volume. IMPRESSION: 1. No acute finding. 2. Chronic microvascular ischemia. Electronically Signed   By: Monte Fantasia M.D.   On: 12/29/2014 22:29   Mr Brain Wo Contrast  12/31/2014  CLINICAL DATA:  Headache in weakness for 2 days. EXAM: MRI  HEAD WITHOUT CONTRAST TECHNIQUE: Multiplanar, multiecho pulse sequences of the brain and surrounding structures were obtained without intravenous contrast. COMPARISON:  Head CT 12/29/2014 FINDINGS: The study is mildly motion degraded. There is no evidence of acute infarct, mass, midline shift, or extra-axial fluid collection. Mild generalized cerebral atrophy is within normal limits for age. There is a chronic lacunar infarct in the left caudate head with associated chronic blood products. Patchy T2 hyperintensities in the subcortical and deep cerebral white matter bilaterally are nonspecific but compatible with moderate chronic small vessel ischemic disease. Prior bilateral cataract extraction is noted. Paranasal sinuses and mastoid air cells are clear. Major intracranial vascular flow voids are preserved. IMPRESSION: 1. No acute intracranial abnormality. 2. Moderate chronic small vessel ischemic disease. Chronic left basal ganglia infarct. Electronically Signed   By: Logan Bores M.D.   On: 12/31/2014 08:06   US Carotid Bilateral  12/30/2014  CLINICAL DATA:  Acute headache for 4 days  with hypertension, stroke symptoms and syncope. Visual disturbance. EXAM: BILATERAL CAROTID DUPLEX ULTRASOUND TECHNIQUE: Pearline Cables scale imaging, color Doppler and duplex ultrasound were performed of bilateral carotid and vertebral arteries in the neck. COMPARISON:  None. FINDINGS: Criteria: Quantification of carotid stenosis is based on velocity parameters that correlate the residual internal carotid diameter with NASCET-based stenosis levels, using the diameter of the distal internal carotid lumen as the denominator for stenosis measurement. The following velocity measurements were obtained: RIGHT ICA:  57/25 cm/sec CCA:  123XX123 cm/sec SYSTOLIC ICA/CCA RATIO:  123456 DIASTOLIC ICA/CCA RATIO:  1.2 ECA:  40 cm/sec LEFT ICA:  65/27 cm/sec CCA:  0000000 cm/sec SYSTOLIC ICA/CCA RATIO:  0000000 DIASTOLIC ICA/CCA RATIO:  1.5 ECA:  71 cm/sec RIGHT CAROTID ARTERY: Minor echogenic shadowing plaque formation. No hemodynamically significant right ICA stenosis, velocity elevation, or turbulent flow. Degree of narrowing less than 50%. RIGHT VERTEBRAL ARTERY:  Antegrade LEFT CAROTID ARTERY: Similar scattered minor echogenic plaque formation. No hemodynamically significant left ICA stenosis, velocity elevation, or turbulent flow. LEFT VERTEBRAL ARTERY:  Antegrade IMPRESSION: Minor carotid atherosclerosis. No hemodynamically significant ICA stenosis by ultrasound. Degree of narrowing less than 50% bilaterally. Patent antegrade vertebral flow bilaterally. Electronically Signed   By: Jerilynn Mages.  Shick M.D.   On: 12/30/2014 14:13   Mm Screening Breast Tomo Bilateral  12/14/2014  CLINICAL DATA:  Screening. EXAM: DIGITAL SCREENING BILATERAL MAMMOGRAM WITH 3D TOMO WITH CAD COMPARISON:  Previous exam(s). ACR Breast Density Category b: There are scattered areas of fibroglandular density. FINDINGS: There are no findings suspicious for malignancy. Images were processed with CAD. IMPRESSION: No mammographic evidence of malignancy. A result letter of this  screening mammogram will be mailed directly to the patient. RECOMMENDATION: Screening mammogram in one year. (Code:SM-B-01Y) BI-RADS CATEGORY  1: Negative. Electronically Signed   By: Lajean Manes M.D.   On: 12/14/2014 09:56    Microbiology: No results found for this or any previous visit (from the past 240 hour(s)).   Labs: Basic Metabolic Panel:  Recent Labs Lab 12/29/14 2130  NA 142  K 3.4*  CL 103  CO2 31  GLUCOSE 113*  BUN 16  CREATININE 1.44*  CALCIUM 9.0   Liver Function Tests:  Recent Labs Lab 12/29/14 2130  AST 13*  ALT 11*  ALKPHOS 87  BILITOT 0.2*  PROT 7.2  ALBUMIN 3.7   No results for input(s): LIPASE, AMYLASE in the last 168 hours. No results for input(s): AMMONIA in the last 168 hours. CBC:  Recent Labs Lab 12/29/14 2130  WBC 10.8*  NEUTROABS 4.8  HGB 13.7  HCT 41.6  MCV 92.2  PLT 250   Cardiac Enzymes:  Recent Labs Lab 12/29/14 2130  TROPONINI <0.03   BNP: BNP (last 3 results)  Recent Labs  12/29/14 2130  BNP 26.0    ProBNP (last 3 results) No results for input(s): PROBNP in the last 8760 hours.  CBG:  Recent Labs Lab 12/29/14 2304  GLUCAP 106*     Signed:  Velvet Bathe  [facility] 12/31/2014, 9:30 AM

## 2014-12-31 NOTE — Care Management Important Message (Signed)
Important Message  Patient Details  Name: Holly Flores MRN: [id number] Date of Birth: 02-10-1946   Medicare Important Message Given:  N/A - LOS <3 / Initial given by admissions    Sherald Barge, RN 12/31/2014, 12:10 PM

## 2014-12-31 NOTE — Clinical Documentation Improvement (Signed)
Family Medicine Internal Medicine  #1 of 2:  Based on the clinical findings below, please document any associated diagnoses/conditions the patient has or may have.   Morbid obesity  Other  Clinically Undetermined  Supporting Information: Current chart states height 5 feet 1 inch, weight 231 pounds, and BMI 43.84.    #2 of 2:  Heart failure currently documented in chart.  Echo performed 11/13.  Please identify acuity and type of CHF present.    Please exercise your independent, professional judgment when responding. A specific answer is not anticipated or expected. Please update your documentation within the medical record to reflect your response to this query.  Thank you, Mateo Flow, RN [phone] Clinical Documentation Specialist

## 2015-01-16 ENCOUNTER — Emergency Department (HOSPITAL_COMMUNITY)
Admission: EM | Admit: 2015-01-16 | Discharge: 2015-01-16 | Disposition: A | Discharge status: Home / Self Care | Payer: Medicare Other | Attending: Emergency Medicine | Admitting: Emergency Medicine

## 2015-01-16 ENCOUNTER — Encounter (HOSPITAL_COMMUNITY): Payer: Self-pay | Admitting: *Deleted

## 2015-01-16 ENCOUNTER — Emergency Department (HOSPITAL_COMMUNITY): Payer: Medicare Other

## 2015-01-16 DIAGNOSIS — I509 Heart failure, unspecified: Secondary | ICD-10-CM | POA: Insufficient documentation

## 2015-01-16 DIAGNOSIS — Z8673 Personal history of transient ischemic attack (TIA), and cerebral infarction without residual deficits: Secondary | ICD-10-CM | POA: Insufficient documentation

## 2015-01-16 DIAGNOSIS — Z9981 Dependence on supplemental oxygen: Secondary | ICD-10-CM | POA: Insufficient documentation

## 2015-01-16 DIAGNOSIS — M7989 Other specified soft tissue disorders: Secondary | ICD-10-CM | POA: Diagnosis present

## 2015-01-16 DIAGNOSIS — Z880 Allergy status to penicillin: Secondary | ICD-10-CM | POA: Diagnosis not present

## 2015-01-16 DIAGNOSIS — I10 Essential (primary) hypertension: Secondary | ICD-10-CM | POA: Diagnosis not present

## 2015-01-16 DIAGNOSIS — E039 Hypothyroidism, unspecified: Secondary | ICD-10-CM | POA: Diagnosis not present

## 2015-01-16 DIAGNOSIS — K219 Gastro-esophageal reflux disease without esophagitis: Secondary | ICD-10-CM | POA: Diagnosis not present

## 2015-01-16 DIAGNOSIS — Z87891 Personal history of nicotine dependence: Secondary | ICD-10-CM | POA: Insufficient documentation

## 2015-01-16 DIAGNOSIS — Z79899 Other long term (current) drug therapy: Secondary | ICD-10-CM | POA: Insufficient documentation

## 2015-01-16 DIAGNOSIS — I252 Old myocardial infarction: Secondary | ICD-10-CM | POA: Diagnosis not present

## 2015-01-16 DIAGNOSIS — F329 Major depressive disorder, single episode, unspecified: Secondary | ICD-10-CM | POA: Diagnosis not present

## 2015-01-16 DIAGNOSIS — G4730 Sleep apnea, unspecified: Secondary | ICD-10-CM | POA: Insufficient documentation

## 2015-01-16 DIAGNOSIS — M1990 Unspecified osteoarthritis, unspecified site: Secondary | ICD-10-CM | POA: Diagnosis not present

## 2015-01-16 DIAGNOSIS — R635 Abnormal weight gain: Secondary | ICD-10-CM | POA: Diagnosis not present

## 2015-01-16 DIAGNOSIS — J441 Chronic obstructive pulmonary disease with (acute) exacerbation: Secondary | ICD-10-CM | POA: Diagnosis not present

## 2015-01-16 DIAGNOSIS — E785 Hyperlipidemia, unspecified: Secondary | ICD-10-CM | POA: Insufficient documentation

## 2015-01-16 DIAGNOSIS — R600 Localized edema: Secondary | ICD-10-CM | POA: Insufficient documentation

## 2015-01-16 DIAGNOSIS — Q6100 Congenital renal cyst, unspecified: Secondary | ICD-10-CM | POA: Diagnosis not present

## 2015-01-16 DIAGNOSIS — F419 Anxiety disorder, unspecified: Secondary | ICD-10-CM | POA: Diagnosis not present

## 2015-01-16 DIAGNOSIS — R609 Edema, unspecified: Secondary | ICD-10-CM

## 2015-01-16 LAB — CBC
HEMATOCRIT: 40.9 % (ref 36.0–46.0)
Hemoglobin: 13.8 g/dL (ref 12.0–15.0)
MCH: 30.7 pg (ref 26.0–34.0)
MCHC: 33.7 g/dL (ref 30.0–36.0)
MCV: 91.1 fL (ref 78.0–100.0)
PLATELETS: 250 10*3/uL (ref 150–400)
RBC: 4.49 MIL/uL (ref 3.87–5.11)
RDW: 13.8 % (ref 11.5–15.5)
WBC: 8.8 10*3/uL (ref 4.0–10.5)

## 2015-01-16 LAB — BASIC METABOLIC PANEL
Anion gap: 8 (ref 5–15)
BUN: 18 mg/dL (ref 6–20)
CHLORIDE: 104 mmol/L (ref 101–111)
CO2: 28 mmol/L (ref 22–32)
CREATININE: 1.24 mg/dL — AB (ref 0.44–1.00)
Calcium: 8.7 mg/dL — ABNORMAL LOW (ref 8.9–10.3)
GFR calc Af Amer: 50 mL/min — ABNORMAL LOW (ref >60)
GFR, EST NON AFRICAN AMERICAN: 43 mL/min — AB (ref >60)
GLUCOSE: 128 mg/dL — AB (ref 65–99)
POTASSIUM: 3.4 mmol/L — AB (ref 3.5–5.1)
Sodium: 140 mmol/L (ref 135–145)

## 2015-01-16 LAB — BRAIN NATRIURETIC PEPTIDE: B Natriuretic Peptide: 38 pg/mL (ref 0.0–100.0)

## 2015-01-16 LAB — PROTIME-INR
INR: 1.1 (ref 0.00–1.49)
Prothrombin Time: 14.4 seconds (ref 11.6–15.2)

## 2015-01-16 LAB — TROPONIN I: Troponin I: <0.03 ng/mL (ref <0.031)

## 2015-01-16 MED ORDER — FUROSEMIDE 10 MG/ML IJ SOLN: 20.0000 mg | Freq: Once | INTRAMUSCULAR | Status: DC

## 2015-01-16 MED ORDER — FUROSEMIDE 40 MG PO TABS
40.0000 mg | ORAL_TABLET | Freq: Once | ORAL | Status: AC
  Administered 2015-01-16: 40 mg via ORAL
  Filled 2015-01-16: qty 1

## 2015-01-16 MED ORDER — FUROSEMIDE 10 MG/ML IJ SOLN
20.0000 mg | Freq: Once | INTRAMUSCULAR | Status: AC
  Administered 2015-01-16: 20 mg via INTRAVENOUS
  Filled 2015-01-16: qty 2

## 2015-01-16 NOTE — ED Notes (Signed)
Patient reports gaining six pounds since Monday, hx of CHF.

## 2015-01-16 NOTE — ED Notes (Signed)
Patient c/o chest pain that started yesterday, left sided chest pain with radiation to jaw.

## 2015-01-16 NOTE — Discharge Instructions (Signed)

## 2015-01-16 NOTE — ED Provider Notes (Signed)
CSN: [id number]     Arrival date & time 01/16/15  1153 History  By signing my name below, I, Tula Nakayama, attest that this documentation has been prepared under the direction and in the presence of Dorie Rank, MD.  Electronically Signed: Tula Nakayama, ED Scribe. 01/16/2015. 12:26 PM.   Chief Complaint  Patient presents with  . Chest Pain   The history is provided by the patient. No language interpreter was used.    HPI Comments: Holly Flores is a 69 y.o. female with a history of CHF and MI who presents to the Emergency Department complaining of constant, moderate SOB and moderate, central, pressure-like CP that started last night. She states 6-lb weight gain in the last 2 days, leg swelling and coughing as associated symptoms. Pt has been compliant with all medications. She was seen by her PCP earlier today for the same who referred her to the ED to rule out CHF. Pt denies fever.   Past Medical History  Diagnosis Date  . CHF (congestive heart failure) ([place])   . Arthritis   . Stroke ([place])   . Hypertension   . GERD (gastroesophageal reflux disease)   . COPD (chronic obstructive pulmonary disease) ([place])   . Shortness of breath   . Anxiety   . Depression   . Hyperlipidemia   . Goiter   . Kidney cysts     left side  . Stroke ([place])     OCULAR  LEFT  EYE    LAST YR.  Marland Kitchen Hypothyroidism   . Myocardial infarction ([place])     8 yrs ago.   . Sleep apnea     uses CPAP, 3   Past Surgical History  Procedure Laterality Date  . Knee surgery Bilateral   . Cholecystectomy    . Tonsillectomy    . Cesarean section      x2  . Cervical spine surgery    . Cardiac catheterization    . Abdominal hysterectomy    . Breast biopsy      left-non cancerous  . Cataract extraction w/phaco  09/21/2011    Procedure: CATARACT EXTRACTION PHACO AND INTRAOCULAR LENS PLACEMENT (IOC);  Surgeon: Williams Che, MD;  Location: [facility];  Service: Ophthalmology;  Laterality: Left;  CDE:9.78  . Total knee  arthroplasty Right 05/24/2013    DR MURPHY  . Total knee arthroplasty Right 05/24/2013    Procedure: TOTAL KNEE ARTHROPLASTY;  Surgeon: [name], MD;  Location: [facility];  Service: Orthopedics;  Laterality: Right;  . Esophagogastroduodenoscopy  2006    Dr. Gala Romney: Subtle Schatzkis ring, otherwise normal upper GI tract, aside from a small pyloric channell erosion, status post dilation as described above.   . Colonoscopy  2006    RMR: 1. Internal hemorroids, otherwise normal rectum. 2. Pedunculated polyp at 35 cm. reomved with snare. The remainder of teh colonic mucosa appeared normal.   . Colonoscopy  2011    Dr. Gala Romney: multiple ascending colon polyps and rectal polyp, adenomatous  . Colonoscopy N/A 05/31/2014    Procedure: COLONOSCOPY;  Surgeon: Daneil Dolin, MD;  Location: [facility];  Service: Endoscopy;  Laterality: N/A;  130  . Back surgery      x3,cervical and lunmbar, disc.  . Cataract extraction w/phaco Right 10/15/2014    Procedure: CATARACT EXTRACTION PHACO AND INTRAOCULAR LENS PLACEMENT (IOC);  Surgeon: Williams Che, MD;  Location: [facility];  Service: Ophthalmology;  Laterality: Right;  CDE:4.19   Family History  Problem Relation Age of Onset  . Colon cancer Neg Hx    Social History  Substance Use Topics  . Smoking status: Former Smoker -- 0.50 packs/day for 10 years    Types: Cigarettes    Quit date: 04/21/1987  . Smokeless tobacco: Never Used  . Alcohol Use: 1.2 oz/week    2 Glasses of wine per week   OB History    No data available     Review of Systems  Constitutional: Positive for unexpected weight change. Negative for fever.  Respiratory: Positive for cough and shortness of breath.   Cardiovascular: Positive for chest pain and leg swelling.  All other systems reviewed and are negative.   Allergies  Sulfa antibiotics and Penicillins  Home Medications   Prior to Admission medications   Medication Sig Start Date End Date Taking? Authorizing Provider   albuterol (PROVENTIL HFA;VENTOLIN HFA) 108 (90 BASE) MCG/ACT inhaler Inhale 2 puffs into the lungs every 6 (six) hours as needed for shortness of breath.    Yes Historical Provider, MD  albuterol (PROVENTIL) (2.5 MG/3ML) 0.083% nebulizer solution Take 2.5 mg by nebulization 3 (three) times daily.   Yes Historical Provider, MD  alprazolam Duanne Moron) 2 MG tablet Take 2 mg by mouth 3 (three) times daily as needed for anxiety.    Yes Historical Provider, MD  Carboxymethylcellulose Sodium (THERATEARS) 0.25 % SOLN Apply 1 drop to eye daily.   Yes Historical Provider, MD  DHA-EPA-Flaxseed Oil-Vitamin E (THERA TEARS NUTRITION PO) Take 1 drop by mouth 3 (three) times daily.   Yes Historical Provider, MD  esomeprazole (NEXIUM) 40 MG capsule Take 40 mg by mouth daily as needed (heartburn). For heartburn   Yes Historical Provider, MD  levothyroxine (LEVOTHROID) 50 MCG tablet Take 50 mcg by mouth daily before breakfast.   Yes Historical Provider, MD  MEGARED OMEGA-3 KRILL OIL 500 MG CAPS Take 1 capsule by mouth daily.   Yes Historical Provider, MD  methocarbamol (ROBAXIN) 500 MG tablet Take 1 tablet (500 mg total) by mouth 4 (four) times daily. Patient taking differently: Take 500 mg by mouth every 6 (six) hours as needed for muscle spasms.  05/25/13  Yes Aundra Dubin, PA-C  metoCLOPramide (REGLAN) 5 MG tablet Take 5 mg by mouth every other day. Takes only rarely   Yes Historical Provider, MD  mometasone (NASONEX) 50 MCG/ACT nasal spray Place 2 sprays into the nose daily as needed (allergies).    Yes Historical Provider, MD  Multiple Vitamin (MULTIVITAMIN WITH MINERALS) TABS Take 1 tablet by mouth daily.   Yes Historical Provider, MD  naproxen sodium (ANAPROX) 220 MG tablet Take 440 mg by mouth daily as needed (pain).   Yes Historical Provider, MD  oxyCODONE-acetaminophen (ROXICET) 5-325 MG per tablet Take 1-2 tablets by mouth every 4 (four) hours as needed. 05/25/13  Yes Aundra Dubin, PA-C  potassium chloride SA  (K-DUR,KLOR-CON) 20 MEQ tablet Take 20 mEq by mouth daily.   Yes Historical Provider, MD  simvastatin (ZOCOR) 10 MG tablet Take 10 mg by mouth daily.   Yes Historical Provider, MD  torsemide (DEMADEX) 20 MG tablet Take 40 mg by mouth daily.   Yes Historical Provider, MD  Vitamin D, Ergocalciferol, (DRISDOL) 50000 UNITS CAPS Take 50,000 Units by mouth 2 (two) times a week. On Tuesday and Thursday   Yes Historical Provider, MD   BP 125/84 mmHg  Pulse 70  Temp(Src) 98.1 F (36.7 C) (Oral)  Resp 22  Ht 5\' 4"  (1.626 m)  Wt 105.688 kg  BMI 39.97 kg/m2  SpO2 98% Physical Exam  Constitutional: She appears well-developed and well-nourished. No distress.  HENT:  Head: Normocephalic and atraumatic.  Right Ear: External ear normal.  Left Ear: External ear normal.  Eyes: Conjunctivae are normal. Right eye exhibits no discharge. Left eye exhibits no discharge. No scleral icterus.  Neck: No JVD present. No tracheal deviation present.  Cardiovascular: Normal rate, regular rhythm and intact distal pulses.   Pulses:      Dorsalis pedis pulses are 2+ on the right side, and 2+ on the left side.  Pulmonary/Chest: Effort normal and breath sounds normal. No stridor. No respiratory distress. She has no wheezes. She has no rales.  Abdominal: Soft. Bowel sounds are normal. She exhibits no distension. There is no tenderness. There is no rebound and no guarding.  Musculoskeletal: She exhibits edema (Trace bilateral LE). She exhibits no tenderness.  Neurological: She is alert. No cranial nerve deficit or sensory deficit. She exhibits normal muscle tone. She displays no seizure activity. Coordination normal.  Skin: Skin is warm and dry. No rash noted.  Psychiatric: She has a normal mood and affect.  Nursing note and vitals reviewed.   ED Course  Procedures  DIAGNOSTIC STUDIES: Oxygen Saturation is 99% on RA, normal by my interpretation.    COORDINATION OF CARE: 12:23 PM Discussed EKG results and treatment  plan with pt which includes lab work and chest x-ray. Pt agreed to plan.   Labs Review Labs Reviewed  BASIC METABOLIC PANEL - Abnormal; Notable for the following:    Potassium 3.4 (*)    Glucose, Bld 128 (*)    Creatinine, Ser 1.24 (*)    Calcium 8.7 (*)    GFR calc non Af Amer 43 (*)    GFR calc Af Amer 50 (*)    All other components within normal limits  CBC  TROPONIN I  BRAIN NATRIURETIC PEPTIDE  PROTIME-INR    Imaging Review Dg Chest 2 View  01/16/2015  CLINICAL DATA:  Dry cough and chest pain since last night. History of COPD. Previous smoking history. EXAM: CHEST  2 VIEW COMPARISON:  Chest x-ray dated 12/29/2014. FINDINGS: Study is hypoinspiratory with crowding of the perihilar and bibasilar bronchovascular markings. Given the low lung volumes, lungs appear clear. No evidence of pneumonia. No pleural effusion. No pneumothorax. No evidence of CHF. Mild cardiomegaly is unchanged. Overall cardiomediastinal silhouette appears stable in size and configuration. Anterior cervical fusion hardware again identified within the lower cervical spine. Mild degenerative change again seen within the thoracic spine. Osseous and soft tissue structures about the chest are otherwise unremarkable. IMPRESSION: Mild cardiomegaly, stable. Hypoinspiratory exam. No evidence of acute cardiopulmonary abnormality. Electronically Signed   By: Franki Cabot M.D.   On: 01/16/2015 12:55   I have personally reviewed and evaluated these images and lab results as part of my medical decision-making.   EKG Interpretation   Date/Time:  Wednesday January 16 2015 12:06:16 EST Ventricular Rate:  81 PR Interval:  282 QRS Duration: 93 QT Interval:  409 QTC Calculation: 475 R Axis:   -34 Text Interpretation:  Sinus rhythm Prolonged PR interval Left axis  deviation Nonspecific T abnormalities, lateral leads No significant change  since last tracing Confirmed by Caitriona Sundquist  MD-J, Ondrea Dow [id number]) on 01/16/2015  12:25:29 PM      Medications  furosemide (LASIX) injection 20 mg (not administered)  furosemide (LASIX) tablet 40 mg (40 mg Oral Given 01/16/15 1423)    MDM  Final diagnoses:  Peripheral edema    Patient complains of mild symptoms that started last evening. Her laboratory tests are reassuring. Her EKG is unchanged and she has a normal troponin. Since the symptoms started last night, I doubt that she's having an acute coronary syndrome. Chest x-ray is not showing congestive heart failure. She has noticed some mild swelling in her lower extremities. I think it is possible her symptoms may be related to mild CHF exacerbation. Patient does want to go home. Will give her a dose of Lasix in the emergency room and have her continue her medications, monitor her fluid and salt intake and follow up with her doctor in the next few days.  I personally performed the services described in this documentation, which was scribed in my presence.  The recorded information has been reviewed and is accurate.     Dorie Rank, MD 01/16/15 1430

## 2015-01-22 ENCOUNTER — Encounter: Payer: Self-pay | Admitting: Neurology

## 2015-01-22 ENCOUNTER — Ambulatory Visit (INDEPENDENT_AMBULATORY_CARE_PROVIDER_SITE_OTHER): Payer: Medicare Other | Admitting: Neurology

## 2015-01-22 VITALS — BP 130/78 | HR 77 | Ht 61.0 in | Wt 236.0 lb

## 2015-01-22 DIAGNOSIS — H348122 Central retinal vein occlusion, left eye, stable: Secondary | ICD-10-CM | POA: Insufficient documentation

## 2015-01-22 DIAGNOSIS — I639 Cerebral infarction, unspecified: Secondary | ICD-10-CM | POA: Diagnosis not present

## 2015-01-22 DIAGNOSIS — H34812 Central retinal vein occlusion, left eye: Secondary | ICD-10-CM

## 2015-01-22 DIAGNOSIS — R531 Weakness: Secondary | ICD-10-CM

## 2015-01-22 DIAGNOSIS — M6289 Other specified disorders of muscle: Secondary | ICD-10-CM | POA: Diagnosis not present

## 2015-01-22 DIAGNOSIS — G43009 Migraine without aura, not intractable, without status migrainosus: Secondary | ICD-10-CM | POA: Diagnosis not present

## 2015-01-22 DIAGNOSIS — M5030 Other cervical disc degeneration, unspecified cervical region: Secondary | ICD-10-CM | POA: Insufficient documentation

## 2015-01-22 DIAGNOSIS — I679 Cerebrovascular disease, unspecified: Secondary | ICD-10-CM

## 2015-01-22 DIAGNOSIS — R200 Anesthesia of skin: Secondary | ICD-10-CM

## 2015-01-22 HISTORY — DX: Central retinal vein occlusion, left eye, stable: H34.8122

## 2015-01-22 MED ORDER — GABAPENTIN 100 MG PO CAPS: ORAL_CAPSULE | ORAL | Status: DC

## 2015-01-22 NOTE — Progress Notes (Signed)
Chart forwarded.  

## 2015-01-22 NOTE — Patient Instructions (Addendum)
1.  Start gabapentin 100mg  capsules.  Take 1 capsule at bedtime for 7 days,  Then 1 capsule twice daily for 7 days,  Then 2 capsules twice daily  Call in 6 weeks with update.  2.  Continue topamax 100mg  twice daily 3.  Restart aspirin 81mg  daily 4.  Continue simvastatin 5.  Follow up in 3 months.  I think you have migraines.  I don't think you have temporal arteritis.  Headaches may be related to your jaw or TMJ

## 2015-01-22 NOTE — Progress Notes (Signed)
NEUROLOGY CONSULTATION NOTE  Holly Flores MRN: [id number] DOB: [birth year]  Referring provider: Dr. Melina Copa Primary care provider: Dr. Melina Copa  Reason for consult:  migraine  HISTORY OF PRESENT ILLNESS: Holly Flores is a 69 year old right-handed female with CHF, COPD, HTN, hyperlipidemia, goiter, hypothyroidism, OSA, status post cervical and lumbar surgery and prior central retinal venous occlusion who presents for stroke and headache.  History obtained by patient, her husband, PCP, ophthalmology and hospital notes.  Images of CT and MRI of brain and labs reviewed.  About 2 years ago, she had a central retinal venous occlusion of the left eye.  She has residual vision loss with blurred vision.  Since that time, she developed left sided headache.  Initially, it was well controlled on topamax 100mg  twice daily.  After a while, she decreased the dose to 100mg  once daily because headaches were well controlled.  Over the past few months, the headaches have become daily.  They are typically mild, 6/10 intensity in the left temporal region.  There is associated photophobia or phonophobia.  Sometimes, there is nausea.  She has taken Percocet in the past.  She takes Aleve, which helps. .  She was admitted to [facility] on 12/29/14 for worsening headache.  The headache was 10/10 and pounding.  She reports longstanding history of residual left arm weakness, which she said was worse as well.  Initial CT of head revealed no acute intracranial process.  MRI of the brain showed moderate chronic small vessel ischemic changes with chronic left basal ganglia infarct, but no acute stroke.  Carotid doppler showed no hemodynamically significant ICA stenosis with less than 50% bilaterally.  Echo showed EF 65-70% with grade 1 diastolic dysfunction.  LDL was 112.  Sed Rate was 17 and CRP was mildly elevated at 1.3.  Afterwards, she restarted topamax twice daily and stopped ASA.  She saw her ophthalmologist  recently.  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  She has osteoarthritis but denies diffuse joint pain, rash or fever.  PAST MEDICAL HISTORY: Past Medical History  Diagnosis Date  . CHF (congestive heart failure) ([place])   . Arthritis   . Stroke ([place])   . Hypertension   . GERD (gastroesophageal reflux disease)   . COPD (chronic obstructive pulmonary disease) ([place])   . Shortness of breath   . Anxiety   . Depression   . Hyperlipidemia   . Goiter   . Kidney cysts     left side  . Stroke ([place])     OCULAR  LEFT  EYE    LAST YR.  Marland Kitchen Hypothyroidism   . Myocardial infarction ([place])     8 yrs ago.   . Sleep apnea     uses CPAP, 3    PAST SURGICAL HISTORY: Past Surgical History  Procedure Laterality Date  . Knee surgery Bilateral   . Cholecystectomy    . Tonsillectomy    . Cesarean section      x2  . Cervical spine surgery    . Cardiac catheterization    . Abdominal hysterectomy    . Breast biopsy      left-non cancerous  . Cataract extraction w/phaco  09/21/2011    Procedure: CATARACT EXTRACTION PHACO AND INTRAOCULAR LENS PLACEMENT (IOC);  Surgeon: Williams Che, MD;  Location: [facility];  Service: Ophthalmology;  Laterality: Left;  CDE:9.78  . Total knee arthroplasty Right 05/24/2013  DR Percell Miller  . Total knee arthroplasty Right 05/24/2013    Procedure: TOTAL KNEE ARTHROPLASTY;  Surgeon: [name], MD;  Location: [facility];  Service: Orthopedics;  Laterality: Right;  . Esophagogastroduodenoscopy  2006    Dr. Gala Romney: Subtle Schatzkis ring, otherwise normal upper GI tract, aside from a small pyloric channell erosion, status post dilation as described above.   . Colonoscopy  2006    RMR: 1. Internal hemorroids, otherwise normal rectum. 2. Pedunculated polyp at 35 cm. reomved with snare. The remainder of teh colonic mucosa appeared normal.   . Colonoscopy  2011    Dr. Gala Romney: multiple ascending colon polyps and rectal polyp, adenomatous    . Colonoscopy N/A 05/31/2014    Procedure: COLONOSCOPY;  Surgeon: Daneil Dolin, MD;  Location: [facility];  Service: Endoscopy;  Laterality: N/A;  130  . Back surgery      x3,cervical and lunmbar, disc.  . Cataract extraction w/phaco Right 10/15/2014    Procedure: CATARACT EXTRACTION PHACO AND INTRAOCULAR LENS PLACEMENT (IOC);  Surgeon: Williams Che, MD;  Location: [facility];  Service: Ophthalmology;  Laterality: Right;  CDE:4.19    MEDICATIONS: Current Outpatient Prescriptions on File Prior to Visit  Medication Sig Dispense Refill  . albuterol (PROVENTIL HFA;VENTOLIN HFA) 108 (90 BASE) MCG/ACT inhaler Inhale 2 puffs into the lungs every 6 (six) hours as needed for shortness of breath.     Marland Kitchen albuterol (PROVENTIL) (2.5 MG/3ML) 0.083% nebulizer solution Take 2.5 mg by nebulization 3 (three) times daily.    Marland Kitchen alprazolam (XANAX) 2 MG tablet Take 2 mg by mouth 3 (three) times daily as needed for anxiety.     Marland Kitchen esomeprazole (NEXIUM) 40 MG capsule Take 40 mg by mouth daily as needed (heartburn). For heartburn    . levothyroxine (LEVOTHROID) 50 MCG tablet Take 50 mcg by mouth daily before breakfast.    . MEGARED OMEGA-3 KRILL OIL 500 MG CAPS Take 1 capsule by mouth daily.    . methocarbamol (ROBAXIN) 500 MG tablet Take 1 tablet (500 mg total) by mouth 4 (four) times daily. (Patient taking differently: Take 500 mg by mouth every 6 (six) hours as needed for muscle spasms. ) 90 tablet 0  . metoCLOPramide (REGLAN) 5 MG tablet Take 5 mg by mouth every other day. Takes only rarely    . Multiple Vitamin (MULTIVITAMIN WITH MINERALS) TABS Take 1 tablet by mouth daily.    . naproxen sodium (ANAPROX) 220 MG tablet Take 500 mg by mouth daily as needed (pain).     . potassium chloride SA (K-DUR,KLOR-CON) 20 MEQ tablet Take 20 mEq by mouth daily.    . simvastatin (ZOCOR) 10 MG tablet Take 10 mg by mouth daily.    Marland Kitchen torsemide (DEMADEX) 20 MG tablet Take 40 mg by mouth daily.    . Vitamin D, Ergocalciferol,  (DRISDOL) 50000 UNITS CAPS Take 50,000 Units by mouth 2 (two) times a week. On Tuesday and Thursday     No current facility-administered medications on file prior to visit.    ALLERGIES: Allergies  Allergen Reactions  . Sulfa Antibiotics Swelling    Tongue swelling  . Penicillins Hives, Itching and Rash    FAMILY HISTORY: Family History  Problem Relation Age of Onset  . Colon cancer Neg Hx     SOCIAL HISTORY: Social History   Social History  . Marital Status: Married    Spouse Name: N/A  . Number of Children: N/A  . Years of Education: N/A  Occupational History  . Not on file.   Social History Main Topics  . Smoking status: Former Smoker -- 0.50 packs/day for 10 years    Types: Cigarettes    Quit date: 04/21/1987  . Smokeless tobacco: Never Used  . Alcohol Use: 1.2 oz/week    2 Glasses of wine per week  . Drug Use: No  . Sexual Activity: No   Other Topics Concern  . Not on file   Social History Narrative   12th grade education. Lives with husband.     REVIEW OF SYSTEMS: Constitutional: No fevers, chills, or sweats, no generalized fatigue, change in appetite Eyes: as above Ear, nose and throat: No hearing loss, ear pain, nasal congestion, sore throat Cardiovascular: No chest pain, palpitations Respiratory:  No shortness of breath at rest or with exertion, wheezes GastrointestinaI: No nausea, vomiting, diarrhea, abdominal pain, fecal incontinence Genitourinary:  No dysuria, urinary retention or frequency Musculoskeletal:  Neck pain, back pain Integumentary: No rash, pruritus, skin lesions Neurological: as above Psychiatric: No depression, insomnia, anxiety Endocrine: No palpitations, fatigue, diaphoresis, mood swings, change in appetite, change in weight, increased thirst Hematologic/Lymphatic:  No anemia, purpura, petechiae. Allergic/Immunologic: no itchy/runny eyes, nasal congestion, recent allergic reactions, rashes  PHYSICAL EXAM: Filed Vitals:    01/22/15 1231  BP: 130/78  Pulse: 77   General: No acute distress.   Head:  Normocephalic/atraumatic Eyes:  Fundi difficult to visualize Neck: supple, no paraspinal tenderness, full range of motion Back: No paraspinal tenderness Heart: regular rate and rhythm Lungs: Clear to auscultation bilaterally. Vascular: No carotid bruits. Neurological Exam: Mental status: alert and oriented to person, place, and time, recent and remote memory intact, fund of knowledge intact, attention and concentration intact, speech fluent and not dysarthric, language intact. Cranial nerves: CN I: not tested CN II: pupils equal, round and reactive to light, visual fields intact but visual acuity poor in left eye. CN III, IV, VI:  full range of motion, no nystagmus, no ptosis CN V: facial sensation intact CN VII: upper and lower face symmetric CN VIII: hearing intact CN IX, X: gag intact, uvula midline CN XI: sternocleidomastoid and trapezius muscles intact CN XII: tongue midline Bulk & Tone: normal, no fasciculations. Motor:  4+/5 left grip.  Otherwise, 5/5 throughout Sensation:  Pinprick and vibration sensation decreased in left upper and lower extremities. Deep Tendon Reflexes:  2+ throughout, toes downgoing.  Finger to nose testing:  Without dysmetria.  Heel to shin:  Without dysmetria.  Gait:  Mildly wide-based.  Able to turn and tandem walk. Romberg negative.  IMPRESSION: Probable migraine Left central retinal venous occlusion Left sided numbness and weakness.  Etiology unclear as it does not correlate with location of old lacunar stroke on MRI.  Could it be residual from cervical disc disease? Cerebrovascular disease Hyperlipidemia  PLAN: 1.  Add gabapentin 100mg  and titrate to 200mg  twice daily 2.  Continue topamax 100mg  twice daily 3.  Restart ASA 81mg  daily 4.  Continue statin therapy (LDL goal should be less than 70) 5.  She is to call in 6 weeks with update.  Follow up in 3  months.  Thank you for allowing me to take part in the care of this patient.  Metta Clines, DO  CC:  Octavio Graves, DO

## 2015-02-12 NOTE — Progress Notes (Signed)
   12/30/14 1214  Acute Rehab PT Goals  PT Goal Formulation All assessment and education complete, DC therapy  PT Time Calculation  PT Start Time (ACUTE ONLY) 1145  PT Stop Time (ACUTE ONLY) 1203  PT Time Calculation (min) (ACUTE ONLY) 18 min  PT G-Codes **NOT FOR INPATIENT CLASS**  Functional Assessment Tool Used clinical judgment  Functional Limitation Mobility: Walking and moving around  Mobility: Walking and Moving Around Current Status [id number]) CK  Mobility: Walking and Moving Around Goal Status [id number]) CK  Mobility: Walking and Moving Around Discharge Status [id number]) CK  PT General Charges  $$ ACUTE PT VISIT 1 Procedure  PT Evaluation  $Initial PT Evaluation Tier I 1 Procedure   Late entry G codes base on evaluation:  1:18 PM 02/12/2015  Etta Grandchild, PT, DPT [place] License # [id number]

## 2015-04-24 ENCOUNTER — Ambulatory Visit (INDEPENDENT_AMBULATORY_CARE_PROVIDER_SITE_OTHER): Payer: Medicare Other | Admitting: Neurology

## 2015-04-24 ENCOUNTER — Encounter: Payer: Self-pay | Admitting: Neurology

## 2015-04-24 VITALS — BP 148/70 | HR 87 | Ht 62.0 in | Wt 229.0 lb

## 2015-04-24 DIAGNOSIS — R200 Anesthesia of skin: Secondary | ICD-10-CM

## 2015-04-24 DIAGNOSIS — M6289 Other specified disorders of muscle: Secondary | ICD-10-CM | POA: Diagnosis not present

## 2015-04-24 DIAGNOSIS — R296 Repeated falls: Secondary | ICD-10-CM

## 2015-04-24 DIAGNOSIS — I679 Cerebrovascular disease, unspecified: Secondary | ICD-10-CM

## 2015-04-24 DIAGNOSIS — R208 Other disturbances of skin sensation: Secondary | ICD-10-CM | POA: Diagnosis not present

## 2015-04-24 DIAGNOSIS — I10 Essential (primary) hypertension: Secondary | ICD-10-CM

## 2015-04-24 DIAGNOSIS — R29898 Other symptoms and signs involving the musculoskeletal system: Secondary | ICD-10-CM | POA: Diagnosis not present

## 2015-04-24 DIAGNOSIS — G43009 Migraine without aura, not intractable, without status migrainosus: Secondary | ICD-10-CM

## 2015-04-24 DIAGNOSIS — M542 Cervicalgia: Secondary | ICD-10-CM

## 2015-04-24 DIAGNOSIS — M545 Low back pain, unspecified: Secondary | ICD-10-CM

## 2015-04-24 NOTE — Patient Instructions (Signed)
1.  Continue topamax 100mg  twice daily 2.  We will get MRI of cervical and lumbar spine without contrast to assess left hand weakness, left leg weakness and left leg numbness and falls. 3.  Will refer you to physical therapy for frequent falls 4.  Follow up

## 2015-04-24 NOTE — Progress Notes (Signed)
NEUROLOGY FOLLOW UP OFFICE NOTE  KALLIN BOLINSKY [id number]  HISTORY OF PRESENT ILLNESS: Holly Flores is a 70 year old right-handed female with CHF, COPD, HTN, hyperlipidemia, goiter, hypothyroidism, OSA, status post cervical and lumbar surgery and prior central retinal venous occlusion who presents follows up for migraine and cerebrovascular disease.  UPDATE: She is taking topamax 100mg  twice daily.  Gabapentin caused tongue swelling.  Headaches are well controlled.  They occur twice a month.  She takes oxycodone for it, as prescribed by her PCP. She continues to have left sided weakness.  She notes numbness in the left leg, which causes frequent falls.  They occur once a week.  Her last fall was Saturday.  She reports neck and back pain.  She feels like she is starting to get some weakness in the right hand and numbness in the right leg.  HISTORY: About 2 years ago, she had a central retinal venous occlusion of the left eye.  She has residual vision loss with blurred vision.  Since that time, she developed left sided headache.  Initially, it was well controlled on topamax 100mg  twice daily.  After a while, she decreased the dose to 100mg  once daily because headaches were well controlled.  Over the past few months, the headaches had become daily.  They are typically mild, 6/10 intensity in the left temporal region.  There is associated photophobia or phonophobia.  Sometimes, there is nausea.  Aleve helps.  Past medication include Percocet.  She was admitted to [facility] on 12/29/14 for worsening headache.  The headache was 10/10 and pounding.  She reports longstanding history of residual left arm weakness, which she said was worse as well.  Initial CT of head revealed no acute intracranial process.  MRI of the brain showed moderate chronic small vessel ischemic changes with chronic left basal ganglia infarct, but no acute stroke.  Carotid doppler showed no hemodynamically  significant ICA stenosis with less than 50% bilaterally.  Echo showed EF 65-70% with grade 1 diastolic dysfunction.  LDL was 112.  Sed Rate was 17 and CRP was mildly elevated at 1.3.  Afterwards, she restarted topamax twice daily and stopped ASA.  She saw her ophthalmologist recently.  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  She has osteoarthritis but denies diffuse joint pain, rash or fever.  PAST MEDICAL HISTORY: Past Medical History  Diagnosis Date  . CHF (congestive heart failure) ([place])   . Arthritis   . Stroke ([place])   . Hypertension   . GERD (gastroesophageal reflux disease)   . COPD (chronic obstructive pulmonary disease) ([place])   . Shortness of breath   . Anxiety   . Depression   . Hyperlipidemia   . Goiter   . Kidney cysts     left side  . Stroke ([place])     OCULAR  LEFT  EYE    LAST YR.  Marland Kitchen Hypothyroidism   . Myocardial infarction ([place])     8 yrs ago.   . Sleep apnea     uses CPAP, 3    MEDICATIONS: Current Outpatient Prescriptions on File Prior to Visit  Medication Sig Dispense Refill  . albuterol (PROVENTIL HFA;VENTOLIN HFA) 108 (90 BASE) MCG/ACT inhaler Inhale 2 puffs into the lungs every 6 (six) hours as needed for shortness of breath.     Marland Kitchen albuterol (PROVENTIL) (2.5 MG/3ML) 0.083% nebulizer solution Take 2.5 mg by nebulization 3 (three)  times daily.    Marland Kitchen alprazolam (XANAX) 2 MG tablet Take 2 mg by mouth 3 (three) times daily as needed for anxiety.     Marland Kitchen esomeprazole (NEXIUM) 40 MG capsule Take 40 mg by mouth daily as needed (heartburn). For heartburn    . levothyroxine (LEVOTHROID) 50 MCG tablet Take 50 mcg by mouth daily before breakfast.    . MEGARED OMEGA-3 KRILL OIL 500 MG CAPS Take 1 capsule by mouth daily.    . methocarbamol (ROBAXIN) 500 MG tablet Take 1 tablet (500 mg total) by mouth 4 (four) times daily. (Patient taking differently: Take 500 mg by mouth every 6 (six) hours as needed for muscle spasms. ) 90 tablet 0   . metoCLOPramide (REGLAN) 5 MG tablet Take 5 mg by mouth every other day. Takes only rarely    . Multiple Vitamin (MULTIVITAMIN WITH MINERALS) TABS Take 1 tablet by mouth daily.    . nebivolol (BYSTOLIC) 5 MG tablet Take 5 mg by mouth daily.    Marland Kitchen oxyCODONE-acetaminophen (ENDOCET) 10-325 MG tablet Take 1 tablet by mouth every 4 (four) hours as needed for pain.    . potassium chloride SA (K-DUR,KLOR-CON) 20 MEQ tablet Take 20 mEq by mouth daily.    . simvastatin (ZOCOR) 10 MG tablet Take 10 mg by mouth daily.    Marland Kitchen topiramate (TOPAMAX) 100 MG tablet Take 100 mg by mouth 2 (two) times daily.    Marland Kitchen torsemide (DEMADEX) 20 MG tablet Take 40 mg by mouth daily.    . Vitamin D, Ergocalciferol, (DRISDOL) 50000 UNITS CAPS Take 50,000 Units by mouth 2 (two) times a week. On Tuesday and Thursday    . naproxen sodium (ANAPROX) 220 MG tablet Take 500 mg by mouth daily as needed (pain). Reported on 04/24/2015     No current facility-administered medications on file prior to visit.    ALLERGIES: Allergies  Allergen Reactions  . Sulfa Antibiotics Swelling    Tongue swelling  . Penicillins Hives, Itching and Rash    FAMILY HISTORY: Family History  Problem Relation Age of Onset  . Colon cancer Neg Hx     SOCIAL HISTORY: Social History   Social History  . Marital Status: Married    Spouse Name: N/A  . Number of Children: N/A  . Years of Education: N/A   Occupational History  . Not on file.   Social History Main Topics  . Smoking status: Former Smoker -- 0.50 packs/day for 10 years    Types: Cigarettes    Quit date: 04/21/1987  . Smokeless tobacco: Never Used  . Alcohol Use: 1.2 oz/week    2 Glasses of wine per week  . Drug Use: No  . Sexual Activity: No   Other Topics Concern  . Not on file   Social History Narrative   12th grade education. Lives with husband.     REVIEW OF SYSTEMS: Constitutional: No fevers, chills, or sweats, no generalized fatigue, change in appetite Eyes: No  visual changes, double vision, eye pain Ear, nose and throat: No hearing loss, ear pain, nasal congestion, sore throat Cardiovascular: No chest pain, palpitations Respiratory:  No shortness of breath at rest or with exertion, wheezes GastrointestinaI: No nausea, vomiting, diarrhea, abdominal pain, fecal incontinence Genitourinary:  No dysuria, urinary retention or frequency Musculoskeletal:  No neck pain, back pain Integumentary: No rash, pruritus, skin lesions Neurological: as above Psychiatric: No depression, insomnia, anxiety Endocrine: No palpitations, fatigue, diaphoresis, mood swings, change in appetite, change in weight, increased thirst  Hematologic/Lymphatic:  No anemia, purpura, petechiae. Allergic/Immunologic: no itchy/runny eyes, nasal congestion, recent allergic reactions, rashes  PHYSICAL EXAM: Filed Vitals:   04/24/15 1247  BP: 148/70  Pulse: 87   General: No acute distress.  Patient appears well-groomed.   Head:  Normocephalic/atraumatic Eyes:  Fundoscopic exam unremarkable without vessel changes, exudates, hemorrhages or papilledema. Neck: supple, no paraspinal tenderness, full range of motion Heart:  Regular rate and rhythm Lungs:  Clear to auscultation bilaterally Back: No paraspinal tenderness Neurological Exam: alert and oriented to person, place, and time. Attention span and concentration intact, recent and remote memory intact, fund of knowledge intact.  Speech fluent and not dysarthric, language intact.  CN II-XII intact. Fundoscopic exam unremarkable without vessel changes, exudates, hemorrhages or papilledema.  Bulk and tone normal, muscle strength 5-/5 left hand grip and hip flexion/extension (may be limited by pain).  Otherwise 5/5 throughout.  Sensation to light touch, temperature and vibration intact.  Deep tendon reflexes 2+ throughout, toes downgoing.  Finger to nose testing intact.  Gait wide based and antalgic, Romberg with sway  IMPRESSION: Frequent  falls.  She says related to numbness in the left leg. Left sided numbness and weakness.  Etiology unclear as it does not correlate with location of old lacunar stroke on MRI.  Could it be residual from cervical disc disease? Migraine without aura Cerebrovascular disease Hyperlipidemia HTN  PLAN: 1.  Given hand weakness and leg weakness and numbness, and with history of cervical and lumbar disease, will get MRI of cervical and lumbar spines to evaluate for etiology.  If unrevealing, NCV-EMG of left upper and lower extremities. 2.  Topamax 100mg  twice daily 3. Referral to PT for falls. 4.  ASA 81mg  daily 5.  LDL goal should be less than 70 (as managed by PCP) 6.  Follow up BP with PCP 7.  Follow up in 3 to 4 months  15 minutes spent face to face with patient, over 50% spent discussing management and diagnosis.  Metta Clines, DO  CC:  Octavio Graves, DO

## 2015-04-24 NOTE — Progress Notes (Signed)
Chart forwarded.  

## 2015-05-06 ENCOUNTER — Other Ambulatory Visit: Payer: Medicare Other

## 2015-05-22 ENCOUNTER — Ambulatory Visit
Admission: RE | Admit: 2015-05-22 | Discharge: 2015-05-22 | Disposition: A | Discharge status: Home / Self Care | Payer: Medicare Other | Source: Ambulatory Visit | Attending: Neurology | Admitting: Neurology

## 2015-05-22 DIAGNOSIS — R296 Repeated falls: Secondary | ICD-10-CM

## 2015-05-22 DIAGNOSIS — I679 Cerebrovascular disease, unspecified: Secondary | ICD-10-CM

## 2015-05-22 DIAGNOSIS — M545 Low back pain, unspecified: Secondary | ICD-10-CM

## 2015-05-22 DIAGNOSIS — R200 Anesthesia of skin: Secondary | ICD-10-CM

## 2015-05-22 DIAGNOSIS — R29898 Other symptoms and signs involving the musculoskeletal system: Secondary | ICD-10-CM

## 2015-05-23 ENCOUNTER — Telehealth: Payer: Self-pay

## 2015-05-23 DIAGNOSIS — R296 Repeated falls: Secondary | ICD-10-CM

## 2015-05-23 NOTE — Telephone Encounter (Signed)
-----   Message from Pieter Partridge, DO sent at 05/23/2015  7:31 AM EDT ----- MRI of cervical and lumbar spine do not show anything significant.  However, there are a couple of disc bulges that may be pressing on a nerve.  I would like to get EMG of left upper and lower extremities for comparison to evaluate for cervical and lumbar radiculopathy

## 2015-05-23 NOTE — Telephone Encounter (Signed)
Message relayed to patient. Verbalized understanding and denied questions. Order placed.  

## 2015-05-24 ENCOUNTER — Telehealth: Payer: Self-pay

## 2015-05-24 NOTE — Telephone Encounter (Signed)
-----   Message from [facility] sent at 05/24/2015 10:12 AM EDT ----- Called PT to schedule her EMG and she does not want to have it done, refused to schedule/Dawn

## 2015-05-24 NOTE — Telephone Encounter (Signed)
FYI

## 2015-08-26 ENCOUNTER — Encounter: Payer: Self-pay | Admitting: Neurology

## 2015-08-26 ENCOUNTER — Ambulatory Visit (INDEPENDENT_AMBULATORY_CARE_PROVIDER_SITE_OTHER): Payer: Medicare Other | Admitting: Neurology

## 2015-08-26 VITALS — BP 124/74 | HR 87 | Ht 64.0 in | Wt 230.0 lb

## 2015-08-26 DIAGNOSIS — M6289 Other specified disorders of muscle: Secondary | ICD-10-CM | POA: Diagnosis not present

## 2015-08-26 DIAGNOSIS — R531 Weakness: Secondary | ICD-10-CM

## 2015-08-26 DIAGNOSIS — I10 Essential (primary) hypertension: Secondary | ICD-10-CM

## 2015-08-26 DIAGNOSIS — G43009 Migraine without aura, not intractable, without status migrainosus: Secondary | ICD-10-CM

## 2015-08-26 DIAGNOSIS — R200 Anesthesia of skin: Secondary | ICD-10-CM

## 2015-08-26 DIAGNOSIS — G5602 Carpal tunnel syndrome, left upper limb: Secondary | ICD-10-CM

## 2015-08-26 DIAGNOSIS — I679 Cerebrovascular disease, unspecified: Secondary | ICD-10-CM

## 2015-08-26 MED ORDER — AMBULATORY NON FORMULARY MEDICATION: 1.0000 | Freq: Once | Status: DC

## 2015-08-26 NOTE — Patient Instructions (Signed)
1. The hand weakness may be carpal tunnel syndrome.  I recommend doing the nerve test because if you have severe carpal tunnel, it will cause potentially permanent hand weakness.  Contact me if you wish to schedule it. 2.  In meantime, we will prescribe you a wrist splint for the left wrist. 3.  Continue topiramate 100mg  twice daily 4.  Follow up in 6 months.

## 2015-08-26 NOTE — Progress Notes (Signed)
NEUROLOGY FOLLOW UP OFFICE NOTE  Holly Flores [id number]  HISTORY OF PRESENT ILLNESS: Holly Flores is a 70 year old right-handed female with CHF, COPD, HTN, hyperlipidemia, goiter, hypothyroidism, OSA, status post cervical and lumbar surgery and prior central retinal venous occlusion who follows up for migraine and cerebrovascular disease and left sided numbness and weakness.  UPDATE: She is taking topamax 100mg  twice daily.  Gabapentin caused tongue swelling.  Headaches are well controlled.  She continues to have left sided weakness.  She notes numbness in the left leg, which causes frequent falls.  I recommended NCV-EMG to evaluate further, but she declined.  MRI of cervical spine from 05/22/15 was personally reviewed and revealed ACDF from C2 through C5, but also showed severe left foraminal stenosis at C5-6 and mild right and moderate left foraminal narrowing at C3-4, but no central stenosis or cord abnormality.  To further evaluate left leg weakness and numbness, MRI of lumbar spine was performed and personally reviewed, revealing lumbar fusion at L4-5 and L5-S1 without residual central canal stenosis.  Mild central and bilateral foraminal narrowing at L3-4 is seen, left greater than right.  HISTORY: About 2.5 years ago, she had a central retinal venous occlusion of the left eye.  She has residual vision loss with blurred vision.  Since that time, she developed left sided headache.  Initially, it was well controlled on topamax 100mg  twice daily.  After a while, she decreased the dose to 100mg  once daily because headaches were well controlled.  Over the past few months, the headaches had become daily.  They are typically mild, 6/10 intensity in the left temporal region.  There is associated photophobia or phonophobia.  Sometimes, there is nausea.  Aleve helps.  Past medication include Percocet.  Gabapentin caused tongue swelling  She was admitted to [facility] on 12/29/14 for  worsening headache.  The headache was 10/10 and pounding.  She reports longstanding history of residual left arm weakness, which she said was worse as well.  Initial CT of head revealed no acute intracranial process.  MRI of the brain showed moderate chronic small vessel ischemic changes with chronic left basal ganglia infarct, but no acute stroke.  Carotid doppler showed no hemodynamically significant ICA stenosis with less than 50% bilaterally.  Echo showed EF 65-70% with grade 1 diastolic dysfunction.  LDL was 112.  Sed Rate was 17 and CRP was mildly elevated at 1.3.  Afterwards, she restarted topamax twice daily and stopped ASA.  She saw her ophthalmologist recently.  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  She has osteoarthritis but denies diffuse joint pain, rash or fever.  PAST MEDICAL HISTORY: Past Medical History  Diagnosis Date  . CHF (congestive heart failure) ([place])   . Arthritis   . Stroke ([place])   . Hypertension   . GERD (gastroesophageal reflux disease)   . COPD (chronic obstructive pulmonary disease) ([place])   . Shortness of breath   . Anxiety   . Depression   . Hyperlipidemia   . Goiter   . Kidney cysts     left side  . Stroke ([place])     OCULAR  LEFT  EYE    LAST YR.  Marland Kitchen Hypothyroidism   . Myocardial infarction ([place])     8 yrs ago.   . Sleep apnea     uses CPAP, 3    MEDICATIONS: Current Outpatient Prescriptions on File Prior to  Visit  Medication Sig Dispense Refill  . albuterol (PROVENTIL HFA;VENTOLIN HFA) 108 (90 BASE) MCG/ACT inhaler Inhale 2 puffs into the lungs every 6 (six) hours as needed for shortness of breath.     Marland Kitchen albuterol (PROVENTIL) (2.5 MG/3ML) 0.083% nebulizer solution Take 2.5 mg by nebulization 3 (three) times daily.    Marland Kitchen alprazolam (XANAX) 2 MG tablet Take 2 mg by mouth 3 (three) times daily as needed for anxiety.     Marland Kitchen aspirin 81 MG tablet Take 81 mg by mouth daily.    Marland Kitchen esomeprazole (NEXIUM) 40 MG  capsule Take 40 mg by mouth daily as needed (heartburn). For heartburn    . levothyroxine (LEVOTHROID) 50 MCG tablet Take 50 mcg by mouth daily before breakfast.    . MEGARED OMEGA-3 KRILL OIL 500 MG CAPS Take 1 capsule by mouth daily.    . methocarbamol (ROBAXIN) 500 MG tablet Take 1 tablet (500 mg total) by mouth 4 (four) times daily. (Patient taking differently: Take 500 mg by mouth every 6 (six) hours as needed for muscle spasms. ) 90 tablet 0  . metoCLOPramide (REGLAN) 5 MG tablet Take 5 mg by mouth every other day. Takes only rarely    . Multiple Vitamin (MULTIVITAMIN WITH MINERALS) TABS Take 1 tablet by mouth daily.    . naproxen sodium (ANAPROX) 220 MG tablet Take 500 mg by mouth daily as needed (pain). Reported on 04/24/2015    . nebivolol (BYSTOLIC) 5 MG tablet Take 5 mg by mouth daily.    Marland Kitchen oxyCODONE-acetaminophen (ENDOCET) 10-325 MG tablet Take 1 tablet by mouth every 4 (four) hours as needed for pain.    . potassium chloride SA (K-DUR,KLOR-CON) 20 MEQ tablet Take 20 mEq by mouth daily.    . simvastatin (ZOCOR) 10 MG tablet Take 10 mg by mouth daily.    Marland Kitchen topiramate (TOPAMAX) 100 MG tablet Take 100 mg by mouth 2 (two) times daily.    Marland Kitchen torsemide (DEMADEX) 20 MG tablet Take 40 mg by mouth daily.    . Vitamin D, Ergocalciferol, (DRISDOL) 50000 UNITS CAPS Take 50,000 Units by mouth 2 (two) times a week. On Tuesday and Thursday     No current facility-administered medications on file prior to visit.    ALLERGIES: Allergies  Allergen Reactions  . Sulfa Antibiotics Swelling    Tongue swelling  . Penicillins Hives, Itching and Rash    FAMILY HISTORY: Family History  Problem Relation Age of Onset  . Colon cancer Neg Hx     SOCIAL HISTORY: Social History   Social History  . Marital Status: Married    Spouse Name: N/A  . Number of Children: N/A  . Years of Education: N/A   Occupational History  . Not on file.   Social History Main Topics  . Smoking status: Former Smoker  -- 0.50 packs/day for 10 years    Types: Cigarettes    Quit date: 04/21/1987  . Smokeless tobacco: Never Used  . Alcohol Use: 1.2 oz/week    2 Glasses of wine per week  . Drug Use: No  . Sexual Activity: No   Other Topics Concern  . Not on file   Social History Narrative   12th grade education. Lives with husband.     REVIEW OF SYSTEMS: Constitutional: No fevers, chills, or sweats, no generalized fatigue, change in appetite Eyes: No visual changes, double vision, eye pain Ear, nose and throat: No hearing loss, ear pain, nasal congestion, sore throat Cardiovascular: No chest pain,  palpitations Respiratory:  No shortness of breath at rest or with exertion, wheezes GastrointestinaI: No nausea, vomiting, diarrhea, abdominal pain, fecal incontinence Genitourinary:  No dysuria, urinary retention or frequency Musculoskeletal:  No neck pain, back pain Integumentary: No rash, pruritus, skin lesions Neurological: as above Psychiatric: No depression, insomnia, anxiety Endocrine: No palpitations, fatigue, diaphoresis, mood swings, change in appetite, change in weight, increased thirst Hematologic/Lymphatic:  No purpura, petechiae. Allergic/Immunologic: no itchy/runny eyes, nasal congestion, recent allergic reactions, rashes  PHYSICAL EXAM: Filed Vitals:   08/26/15 1058  BP: 124/74  Pulse: 87   General: No acute distress.  Patient appears well-groomed.  Obese body habitus. Head:  Normocephalic/atraumatic Eyes:  Fundi examined but not visualized Neck: supple, no paraspinal tenderness, full range of motion Heart:  Regular rate and rhythm Lungs:  Clear to auscultation bilaterally Back: No paraspinal tenderness Neurological Exam: alert and oriented to person, place, and time. Attention span and concentration intact, recent and remote memory intact, fund of knowledge intact.  Speech fluent and not dysarthric, language intact.  CN II-XII intact. Bulk and tone normal, muscle strength 5-/5  left hand grip and hip flexion/extension (may be limited by pain).  Otherwise 5/5 throughout.  Sensation to light touch, temperature and vibration intact.  Deep tendon reflexes 2+ throughout, toes downgoing.  Finger to nose testing intact.  Gait wide based and antalgic  IMPRESSION: 1.  Left sided numbness and weakness.  I have no explanation for subjective numbness of the left arm and leg.  She does have degenerative changes in the spine, but nothing obvious.  The left shoulder weakness may be due to pain in the shoulder.  I question if the hand weakness is related to carpal tunnel syndrome. 2.  Migraine without aura stable 3.  Cerebrovascular disease 4.  HTN   PLAN: 1.  I recommend NCV-EMG of the left upper extremity to evaluate for carpal tunnel syndrome.  If it is severe, it may cause permanent hand weakness and would therefore need surgery.  I explained this to Holly Flores but she would like to think about it (she has a bad experience with NCV-EMG in the past). 2.  Continue topiramate 100mg  twice daily 3.  ASA 81mg  daily 4.  Follow up in 6 months.  15 minutes spent face to face with patient, over 50% spent discussing management.  Metta Clines, DO  CC:  Octavio Graves

## 2015-11-27 ENCOUNTER — Other Ambulatory Visit: Payer: Self-pay | Admitting: *Deleted

## 2015-11-27 DIAGNOSIS — Z1231 Encounter for screening mammogram for malignant neoplasm of breast: Secondary | ICD-10-CM

## 2015-12-23 ENCOUNTER — Ambulatory Visit
Admission: RE | Admit: 2015-12-23 | Discharge: 2015-12-23 | Disposition: A | Discharge status: Home / Self Care | Payer: Medicare Other | Source: Ambulatory Visit | Attending: *Deleted | Admitting: *Deleted

## 2015-12-23 DIAGNOSIS — Z1231 Encounter for screening mammogram for malignant neoplasm of breast: Secondary | ICD-10-CM

## 2016-02-26 ENCOUNTER — Ambulatory Visit (INDEPENDENT_AMBULATORY_CARE_PROVIDER_SITE_OTHER): Payer: Medicare Other | Admitting: Neurology

## 2016-02-26 ENCOUNTER — Other Ambulatory Visit (INDEPENDENT_AMBULATORY_CARE_PROVIDER_SITE_OTHER): Payer: Medicare Other

## 2016-02-26 ENCOUNTER — Encounter: Payer: Self-pay | Admitting: Neurology

## 2016-02-26 VITALS — BP 126/78 | HR 79 | Ht 64.0 in | Wt 226.4 lb

## 2016-02-26 DIAGNOSIS — M67431 Ganglion, right wrist: Secondary | ICD-10-CM | POA: Diagnosis not present

## 2016-02-26 DIAGNOSIS — I679 Cerebrovascular disease, unspecified: Secondary | ICD-10-CM | POA: Diagnosis not present

## 2016-02-26 DIAGNOSIS — G43009 Migraine without aura, not intractable, without status migrainosus: Secondary | ICD-10-CM | POA: Diagnosis not present

## 2016-02-26 DIAGNOSIS — G5603 Carpal tunnel syndrome, bilateral upper limbs: Secondary | ICD-10-CM

## 2016-02-26 LAB — BASIC METABOLIC PANEL
BUN: 18 mg/dL (ref 6–23)
CHLORIDE: 106 meq/L (ref 96–112)
CO2: 25 mEq/L (ref 19–32)
CREATININE: 1.3 mg/dL — AB (ref 0.40–1.20)
Calcium: 9.1 mg/dL (ref 8.4–10.5)
GFR: 52.05 mL/min — ABNORMAL LOW (ref >60.00)
GLUCOSE: 108 mg/dL — AB (ref 70–99)
POTASSIUM: 3.6 meq/L (ref 3.5–5.1)
Sodium: 141 mEq/L (ref 135–145)

## 2016-02-26 NOTE — Progress Notes (Signed)
NEUROLOGY FOLLOW UP OFFICE NOTE  DEVINNE NEYER [id number]  HISTORY OF PRESENT ILLNESS: Holly Flores is a 71 year old right-handed female with CHF, COPD, HTN, hyperlipidemia, goiter, hypothyroidism, OSA, status post cervical and lumbar surgery and prior central retinal venous occlusion who follows up for migraine and cerebrovascular disease and left sided numbness and weakness.   UPDATE: She is taking topamax 100mg  twice daily.  Headaches are well controlled, occurring once a month and lasting a couple of hours.  Last visit, I recommended NCV-EMG to evaluate for carpal tunnel syndrome.  She wanted to hold off for the time being.  In the meantime, she had tried wearing a wrist splint on the left wrist, which is ineffective.  She started experiencing pain in the right wrist as well.    HISTORY: Around 2014, she had a central retinal venous occlusion of the left eye.  She has residual vision loss with blurred vision.  Since that time, she developed left sided headache.  Initially, it was well controlled on topamax 100mg  twice daily.  After a while, she decreased the dose to 100mg  once daily because headaches were well controlled.  Over the past few months, the headaches had become daily.  They are typically mild, 6/10 intensity in the left temporal region.  There is associated photophobia or phonophobia.  Sometimes, there is nausea.  Aleve helps.  Past medication include Percocet.  Gabapentin caused tongue swelling   She was admitted to [facility] on 12/29/14 for worsening headache.  The headache was 10/10 and pounding.  She reports longstanding history of residual left arm weakness, which she said was worse as well.  Initial CT of head revealed no acute intracranial process.  MRI of the brain showed moderate chronic small vessel ischemic changes with chronic left basal ganglia infarct, but no acute stroke.  Carotid doppler showed no hemodynamically significant ICA stenosis with less  than 50% bilaterally.  Echo showed EF 65-70% with grade 1 diastolic dysfunction.  LDL was 112.  Sed Rate was 17 and CRP was mildly elevated at 1.3.  Afterwards, she restarted topamax twice daily and stopped ASA.  She saw her ophthalmologist recently.  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  She has osteoarthritis but denies diffuse joint pain, rash or fever.  She has left sided weakness.  She notes numbness in the left leg, which causes frequent falls.  I recommended NCV-EMG to evaluate further, but she declined.  MRI of cervical spine from 05/22/15 was personally reviewed and revealed ACDF from C2 through C5, but also showed severe left foraminal stenosis at C5-6 and mild right and moderate left foraminal narrowing at C3-4, but no central stenosis or cord abnormality.  To further evaluate left leg weakness and numbness, MRI of lumbar spine was performed and personally reviewed, revealing lumbar fusion at L4-5 and L5-S1 without residual central canal stenosis.  Mild central and bilateral foraminal narrowing at L3-4 is seen, left greater than right.  Past medications:  gabapentin (tongue swelling)  PAST MEDICAL HISTORY: Past Medical History:  Diagnosis Date  . Anxiety   . Arthritis   . CHF (congestive heart failure) ([place])   . COPD (chronic obstructive pulmonary disease) ([place])   . Depression   . GERD (gastroesophageal reflux disease)   . Goiter   . Hyperlipidemia   . Hypertension   . Hypothyroidism   . Kidney cysts    left side  .  Myocardial infarction    8 yrs ago.   Marland Kitchen Shortness of breath   . Sleep apnea    uses CPAP, 3  . Stroke ([place])   . Stroke ([place])    OCULAR  LEFT  EYE    LAST YR.    MEDICATIONS: Current Outpatient Prescriptions on File Prior to Visit  Medication Sig Dispense Refill  . albuterol (PROVENTIL HFA;VENTOLIN HFA) 108 (90 BASE) MCG/ACT inhaler Inhale 2 puffs into the lungs every 6 (six) hours as needed for shortness of  breath.     Marland Kitchen albuterol (PROVENTIL) (2.5 MG/3ML) 0.083% nebulizer solution Take 2.5 mg by nebulization 3 (three) times daily.    Marland Kitchen alprazolam (XANAX) 2 MG tablet Take 2 mg by mouth 3 (three) times daily as needed for anxiety.     . AMBULATORY NON FORMULARY MEDICATION 1 each by Other route once. Cock - up splint - Left. 1 each 0  . aspirin 81 MG tablet Take 81 mg by mouth daily.    Marland Kitchen esomeprazole (NEXIUM) 40 MG capsule Take 40 mg by mouth daily as needed (heartburn). For heartburn    . levothyroxine (LEVOTHROID) 50 MCG tablet Take 50 mcg by mouth daily before breakfast.    . MEGARED OMEGA-3 KRILL OIL 500 MG CAPS Take 1 capsule by mouth daily.    . methocarbamol (ROBAXIN) 500 MG tablet Take 1 tablet (500 mg total) by mouth 4 (four) times daily. (Patient taking differently: Take 500 mg by mouth every 6 (six) hours as needed for muscle spasms. ) 90 tablet 0  . metoCLOPramide (REGLAN) 5 MG tablet Take 5 mg by mouth every other day. Takes only rarely    . Multiple Vitamin (MULTIVITAMIN WITH MINERALS) TABS Take 1 tablet by mouth daily.    . naproxen sodium (ANAPROX) 220 MG tablet Take 500 mg by mouth daily as needed (pain). Reported on 04/24/2015    . nebivolol (BYSTOLIC) 5 MG tablet Take 5 mg by mouth daily.    Marland Kitchen oxyCODONE-acetaminophen (ENDOCET) 10-325 MG tablet Take 1 tablet by mouth every 4 (four) hours as needed for pain.    . potassium chloride SA (K-DUR,KLOR-CON) 20 MEQ tablet Take 20 mEq by mouth daily.    . simvastatin (ZOCOR) 10 MG tablet Take 10 mg by mouth daily.    Marland Kitchen topiramate (TOPAMAX) 100 MG tablet Take 100 mg by mouth 2 (two) times daily.    Marland Kitchen torsemide (DEMADEX) 20 MG tablet Take 40 mg by mouth daily.    . Vitamin D, Ergocalciferol, (DRISDOL) 50000 UNITS CAPS Take 50,000 Units by mouth 2 (two) times a week. On Tuesday and Thursday     No current facility-administered medications on file prior to visit.     ALLERGIES: Allergies  Allergen Reactions  . Sulfa Antibiotics Swelling     Tongue swelling  . Penicillins Hives, Itching and Rash    FAMILY HISTORY: Family History  Problem Relation Age of Onset  . Colon cancer Neg Hx     SOCIAL HISTORY: Social History   Social History  . Marital status: Married    Spouse name: N/A  . Number of children: N/A  . Years of education: N/A   Occupational History  . Not on file.   Social History Main Topics  . Smoking status: Former Smoker    Packs/day: 0.50    Years: 10.00    Types: Cigarettes    Quit date: 04/21/1987  . Smokeless tobacco: Never Used  . Alcohol use 1.2 oz/week  2 Glasses of wine per week  . Drug use: No  . Sexual activity: No   Other Topics Concern  . Not on file   Social History Narrative   12th grade education. Lives with husband.     REVIEW OF SYSTEMS: Constitutional: No fevers, chills, or sweats, no generalized fatigue, change in appetite Eyes: No visual changes, double vision, eye pain Ear, nose and throat: No hearing loss, ear pain, nasal congestion, sore throat Cardiovascular: No chest pain, palpitations Respiratory:  No shortness of breath at rest or with exertion, wheezes GastrointestinaI: No nausea, vomiting, diarrhea, abdominal pain, fecal incontinence Genitourinary:  No dysuria, urinary retention or frequency Musculoskeletal:  No neck pain, back pain Integumentary: No rash, pruritus, skin lesions Neurological: as above Psychiatric: No depression, insomnia, anxiety Endocrine: No palpitations, fatigue, diaphoresis, mood swings, change in appetite, change in weight, increased thirst Hematologic/Lymphatic:  No purpura, petechiae. Allergic/Immunologic: no itchy/runny eyes, nasal congestion, recent allergic reactions, rashes  PHYSICAL EXAM: Vitals:   02/26/16 1114  BP: 126/78  Pulse: 79   General: No acute distress.  Patient appears well-groomed.  normal body habitus. Head:  Normocephalic/atraumatic Eyes:  Fundi examined but not visualized Neck: supple, no paraspinal  tenderness, full range of motion Right hand/wrist:  Tender cyst noted on wrist joint Heart:  Regular rate and rhythm Lungs:  Clear to auscultation bilaterally Back: No paraspinal tenderness Neurological Exam: alert and oriented to person, place, and time. Attention span and concentration intact, recent and remote memory intact, fund of knowledge intact.  Speech fluent and not dysarthric, language intact.  CN II-XII intact. Bulk and tone normal, muscle strength 5-/5 left hand grip and hip flexion/extension (may be limited by pain).  Otherwise 5/5 throughout.  Sensation to light touch  intact.  Deep tendon reflexes trace throughout.  Finger to nose testing intact.  Gait wide based and antalgic  IMPRESSION: 1.  Probable bilateral carpal tunnel syndrome, left worse than right 2.  Migraine without aura stable 3.  Cerebrovascular disease 4.  Probable Ganglion cyst on right wrist  PLAN: 1.  Explained that we need to proceed with NCV of upper extremities 2.  If carpal tunnel verified, then refer to hand surgeon/specialist.  They can address Ganglion cyst as well. 3.  Topiramate 100mg  twice daily (check BMP) 4.  ASA 81mg  daily 5.  Follow up in 6 months.  Metta Clines, DO  CC:  Octavio Graves, DO

## 2016-02-26 NOTE — Patient Instructions (Signed)
1.  Continue topiramate 100mg  twice daily.  Check BMP 2.  Will get nerve conduction studies of both upper extremities to assess for carpal tunnel 3.  Follow up in 6 months.

## 2016-03-09 ENCOUNTER — Telehealth: Payer: Self-pay | Admitting: Neurology

## 2016-03-09 NOTE — Telephone Encounter (Signed)
Blood work is stable compared to last year

## 2016-03-09 NOTE — Telephone Encounter (Signed)
PT said she has not received her blood work results yet and would like a call back/Dawn CB# [phone]

## 2016-03-10 ENCOUNTER — Ambulatory Visit (INDEPENDENT_AMBULATORY_CARE_PROVIDER_SITE_OTHER): Payer: Medicare Other | Admitting: Neurology

## 2016-03-10 DIAGNOSIS — G5603 Carpal tunnel syndrome, bilateral upper limbs: Secondary | ICD-10-CM | POA: Diagnosis not present

## 2016-03-10 DIAGNOSIS — G5621 Lesion of ulnar nerve, right upper limb: Secondary | ICD-10-CM

## 2016-03-10 NOTE — Procedures (Signed)
[facility]  [place], [address]  [place], [place] [address] Tel: [phone] Fax:  [phone] Test Date:  03/10/2016  Patient: Holly Flores DOB: 11-20-1945 Physician: Narda Amber, DO  Sex: Female Height: 5\' 2"  Ref Phys: Metta Clines, D.O.  ID#: [id number] Temp: 34.9C Technician: Jerilynn Mages. Dean   Patient Complaints: This is a 71 year old female referred for evaluation of bilateral hand paresthesias and pain, worse on the left.  NCV & EMG Findings: Extensive electrodiagnostic testing of the left upper extremity and additional studies of the right shows:  1. Right median sensory response is absent. Left median sensory response shows prolonged latency (4.4 ms) and normal amplitude. Bilateral ulnar sensory responses are within normal limits. 2. Bilateral median motor responses show prolonged latency (R4.8, L4.5 ms) and the amplitude on the right is low normal (5.6 mV, normal > 5.0 mV).  Right ulnar motor response shows slowed conduction velocity across the elbow with normal latency and amplitude. Left ulnar motor responses within normal limits. 3. Chronic motor axon loss changes were isolated to the right abductor pollicis brevis muscle, without accompanied active denervation.  Impression: 1. Bilateral median neuropathy at or distal to the wrist, consistent with clinical diagnosis of carpal tunnel syndrome. Overall, these findings are severe in degree electrically on the RIGHT and moderate in degree electrically on the LEFT.  2. Right ulnar neuropathy with slowing across the elbow, purely demyelinating in type. 3. There is no evidence of a cervical radiculopathy affecting the upper extremities.   ___________________________ Narda Amber, DO    Nerve Conduction Studies Anti Sensory Summary Table   Stim Site NR Peak (ms) Norm Peak (ms) P-T Amp (V) Norm P-T Amp  Left Median Anti Sensory (2nd Digit)  34.9C  Wrist    4.4 <3.8 12.2 >10  Right Median Anti Sensory (2nd Digit)   34.9C  Wrist NR  <3.8  >10  Left Ulnar Anti Sensory (5th Digit)  34.9C  Wrist    2.9 <3.2 6.0 >5  Right Ulnar Anti Sensory (5th Digit)  34.9C  Wrist    2.8 <3.2 8.1 >5   Motor Summary Table   Stim Site NR Onset (ms) Norm Onset (ms) O-P Amp (mV) Norm O-P Amp Site1 Site2 Delta-0 (ms) Dist (cm) Vel (m/s) Norm Vel (m/s)  Left Median Motor (Abd Poll Brev)  34.9C  Wrist    4.5 <4.0 8.6 >5 Elbow Wrist 3.5 21.0 60 >50  Elbow    8.0  7.8         Right Median Motor (Abd Poll Brev)  34.9C  Wrist    4.8 <4.0 5.6 >5 Elbow Wrist 3.6 23.0 64 >50  Elbow    8.4  4.3         Left Ulnar Motor (Abd Dig Minimi)  34.9C  Wrist    2.4 <3.1 9.6 >7 B Elbow Wrist 4.1 22.0 54 >50  B Elbow    6.5  8.9  A Elbow B Elbow 1.5 10.0 67 >50  A Elbow    8.0  8.6         Right Ulnar Motor (Abd Dig Minimi)  34.9C  Wrist    2.7 <3.1 8.8 >7 B Elbow Wrist 3.6 18.0 50 >50  B Elbow    6.3  7.0  A Elbow B Elbow 2.7 10.0 37 >50  A Elbow    9.0  6.2         Right Ulnar (FDI) Motor (1st DI)  34.9C  Wrist  3.5 <4.5 7.1 >7 B Elbow Wrist 3.5 18.0 51 >50  B Elbow    7.0  5.3  A Elbow B Elbow 3.0 10.0 33 >50  A Elbow    10.0  5.0          EMG   Side Muscle Ins Act Fibs Psw Fasc Number Recrt Dur Dur. Amp Amp. Poly Poly. Comment  Right 1stDorInt Nml Nml Nml Nml Nml Nml Nml Nml Nml Nml Nml Nml N/A  Right Ext Indicis Nml Nml Nml Nml Nml Nml Nml Nml Nml Nml Nml Nml N/A  Right PronatorTeres Nml Nml Nml Nml Nml Nml Nml Nml Nml Nml Nml Nml N/A  Right Biceps Nml Nml Nml Nml Nml Nml Nml Nml Nml Nml Nml Nml N/A  Right Triceps Nml Nml Nml Nml Nml Nml Nml Nml Nml Nml Nml Nml N/A  Right Deltoid Nml Nml Nml Nml Nml Nml Nml Nml Nml Nml Nml Nml N/A  Right ABD Dig Min Nml Nml Nml Nml Nml Nml Nml Nml Nml Nml Nml Nml N/A  Right FlexCarpiUln Nml Nml Nml Nml Nml Nml Nml Nml Nml Nml Nml Nml N/A  Right Abd Poll Brev Nml Nml Nml Nml 1- Rapid Some 1+ Some 1+ Nml Nml N/A  Left 1stDorInt Nml Nml Nml Nml Nml Nml Nml Nml Nml Nml Nml Nml N/A  Left  Abd Poll Brev Nml Nml Nml Nml Nml Nml Nml Nml Nml Nml Nml Nml N/A  Left Ext Indicis Nml Nml Nml Nml Nml Nml Nml Nml Nml Nml Nml Nml N/A  Left PronatorTeres Nml Nml Nml Nml Nml Nml Nml Nml Nml Nml Nml Nml N/A  Left Biceps Nml Nml Nml Nml Nml Nml Nml Nml Nml Nml Nml Nml N/A  Left Triceps Nml Nml Nml Nml Nml Nml Nml Nml Nml Nml Nml Nml N/A  Left Deltoid Nml Nml Nml Nml Nml Nml Nml Nml Nml Nml Nml Nml N/A      Waveforms:

## 2016-03-10 NOTE — Telephone Encounter (Signed)
Called patient. Gave lab results. Patient verbalized understanding.  

## 2016-03-11 ENCOUNTER — Telehealth: Payer: Self-pay

## 2016-03-11 DIAGNOSIS — G5603 Carpal tunnel syndrome, bilateral upper limbs: Secondary | ICD-10-CM

## 2016-03-11 NOTE — Telephone Encounter (Signed)
Called patient. No answer. Will try later.  

## 2016-03-11 NOTE — Telephone Encounter (Signed)
-----   Message from Pieter Partridge, DO sent at 03/11/2016  7:31 AM EST ----- Nerve study does confirm carpal tunnel syndrome in both wrists.  Since wrist splints are not effective, I recommend referral to hand specialist. There is also evidence that the nerve that runs along the right elbow is pinched, so she should be careful not to lean on the right elbow.

## 2016-03-11 NOTE — Telephone Encounter (Signed)
Spoke to patient.  Gave  Results and instructions. Patient verbalized understanding.

## 2016-03-24 ENCOUNTER — Encounter: Payer: Medicare Other | Admitting: Neurology

## 2016-07-24 ENCOUNTER — Telehealth: Payer: Self-pay | Admitting: Neurology

## 2016-07-24 NOTE — Telephone Encounter (Signed)
Patient stopped by the office to check the status on the referral to a Hand Specialist. She had her EMG in January and was called the next day with results and she was told she would be set up with a Hand Specialist, but she hasn't heard anything since January. Please Advise. Thanks

## 2016-07-24 NOTE — Telephone Encounter (Signed)
Left message for patient that I will send in a referral right now and she should hear from someone soon.  Instructed her to call me back if she doesn't hear from someone by Monday.

## 2016-07-27 ENCOUNTER — Telehealth: Payer: Self-pay | Admitting: Neurology

## 2016-07-27 NOTE — Telephone Encounter (Signed)
Holly Flores with Physical therapy in [place] left a message regarding PT and needed to know if she needs PT or OT CB# [phone]

## 2016-07-28 NOTE — Telephone Encounter (Signed)
Spoke to [place] from [facility] she states patient would like to see a hand specialist or a Psychologist, sport and exercise not have PT. According to the office notes that is what she needs. She will call the patient and let her know. Referral sent to [facility].

## 2016-07-29 ENCOUNTER — Telehealth: Payer: Self-pay | Admitting: Neurology

## 2016-07-29 NOTE — Telephone Encounter (Signed)
Mychart message sent to patient making her aware of appt with Dr Amedeo Plenty.

## 2016-08-25 ENCOUNTER — Ambulatory Visit (INDEPENDENT_AMBULATORY_CARE_PROVIDER_SITE_OTHER): Payer: Medicare Other | Admitting: Neurology

## 2016-08-25 ENCOUNTER — Encounter: Payer: Self-pay | Admitting: Neurology

## 2016-08-25 VITALS — BP 130/80 | HR 52 | Ht 64.0 in | Wt 213.0 lb

## 2016-08-25 DIAGNOSIS — G5603 Carpal tunnel syndrome, bilateral upper limbs: Secondary | ICD-10-CM | POA: Diagnosis not present

## 2016-08-25 DIAGNOSIS — M5412 Radiculopathy, cervical region: Secondary | ICD-10-CM | POA: Diagnosis not present

## 2016-08-25 DIAGNOSIS — G43009 Migraine without aura, not intractable, without status migrainosus: Secondary | ICD-10-CM | POA: Diagnosis not present

## 2016-08-25 NOTE — Progress Notes (Signed)
NEUROLOGY FOLLOW UP OFFICE NOTE  Holly Flores [id number]  HISTORY OF PRESENT ILLNESS: Holly Flores is a 71 year old right-handed female with CHF, COPD, HTN, hyperlipidemia, goiter, hypothyroidism, OSA, status post cervical and lumbar surgery and prior central retinal venous occlusion who follows up for migraine and cerebrovascular disease and left sided numbness and weakness.   UPDATE: She is taking topamax 100mg  twice daily.  Headaches are well controlled, occurring once a month and lasting a couple of hours.  However, she had a daily headache for the past week.  She received a shot and the headache is improved today.   She underwent NCV-EMG of the upper extremities on 03/10/16, which did confirm bilateral carpal tunnel syndrome (severe on the right and moderate on the left), as well as right ulnar neuropathy across the elbow.  Since wrist splints were ineffective, I recommended seeing a hand specialist.  She has an upcoming appointment with Dr. Amedeo Plenty at [facility].    She also has left sided neck pain radiating into the shoulder.     HISTORY: Around 2014, she had a central retinal venous occlusion of the left eye.  She has residual vision loss with blurred vision.  Since that time, she developed left sided headache.  Initially, it was well controlled on topamax 100mg  twice daily.  After a while, she decreased the dose to 100mg  once daily because headaches were well controlled.  They subsequently became daily.  They are typically mild, 6/10 intensity in the left temporal region.  There is associated photophobia or phonophobia.  Sometimes, there is nausea.  There are no specific triggers.  Aleve helps.  Past medication include Percocet.  Gabapentin caused tongue swelling   She was admitted to [facility] on 12/29/14 for worsening headache.  The headache was 10/10 and pounding.  She reports longstanding history of residual left arm weakness, which she said was worse  as well.  Initial CT of head revealed no acute intracranial process.  MRI of the brain showed moderate chronic small vessel ischemic changes with chronic left basal ganglia infarct, but no acute stroke.  Carotid doppler showed no hemodynamically significant ICA stenosis with less than 50% bilaterally.  Echo showed EF 65-70% with grade 1 diastolic dysfunction.  Sed Rate was 17 and CRP was mildly elevated at 1.3.  Afterwards, she restarted topamax twice daily and stopped ASA.  She saw her ophthalmologist recently.  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  She has osteoarthritis but denies diffuse joint pain, rash or fever.   She also has left sided weakness for around 10 years, involving the arm and leg.  She notes numbness in the left leg, which causes frequent falls.  I recommended NCV-EMG to evaluate further, but she declined.  MRI of cervical spine from 05/22/15 was personally reviewed and revealed ACDF from C2 through C5, but also showed severe left foraminal stenosis at C5-6 and mild right and moderate left foraminal narrowing at C3-4, but no central stenosis or cord abnormality.  To further evaluate left leg weakness and numbness, MRI of lumbar spine was performed and personally reviewed, revealing lumbar fusion at L4-5 and L5-S1 without residual central canal stenosis.  Mild central and bilateral foraminal narrowing at L3-4 is seen, left greater than right.   Past medications:  gabapentin (tongue swelling)  PAST MEDICAL HISTORY: Past Medical History:  Diagnosis Date  . Anxiety   . Arthritis   .  CHF (congestive heart failure) ([place])   . COPD (chronic obstructive pulmonary disease) ([place])   . Depression   . GERD (gastroesophageal reflux disease)   . Goiter   . Hyperlipidemia   . Hypertension   . Hypothyroidism   . Kidney cysts    left side  . Myocardial infarction ([place])    8 yrs ago.   Marland Kitchen Shortness of breath   . Sleep apnea    uses CPAP, 3  .  Stroke ([place])   . Stroke ([place])    OCULAR  LEFT  EYE    LAST YR.    MEDICATIONS: Current Outpatient Prescriptions on File Prior to Visit  Medication Sig Dispense Refill  . albuterol (PROVENTIL HFA;VENTOLIN HFA) 108 (90 BASE) MCG/ACT inhaler Inhale 2 puffs into the lungs every 6 (six) hours as needed for shortness of breath.     Marland Kitchen albuterol (PROVENTIL) (2.5 MG/3ML) 0.083% nebulizer solution Take 2.5 mg by nebulization 3 (three) times daily.    Marland Kitchen alprazolam (XANAX) 2 MG tablet Take 2 mg by mouth 3 (three) times daily as needed for anxiety.     . AMBULATORY NON FORMULARY MEDICATION 1 each by Other route once. Cock - up splint - Left. 1 each 0  . aspirin 81 MG tablet Take 81 mg by mouth daily.    Marland Kitchen esomeprazole (NEXIUM) 40 MG capsule Take 40 mg by mouth daily as needed (heartburn). For heartburn    . levothyroxine (LEVOTHROID) 50 MCG tablet Take 50 mcg by mouth daily before breakfast.    . MEGARED OMEGA-3 KRILL OIL 500 MG CAPS Take 1 capsule by mouth daily.    . methocarbamol (ROBAXIN) 500 MG tablet Take 1 tablet (500 mg total) by mouth 4 (four) times daily. (Patient taking differently: Take 500 mg by mouth every 6 (six) hours as needed for muscle spasms. ) 90 tablet 0  . metoCLOPramide (REGLAN) 5 MG tablet Take 5 mg by mouth every other day. Takes only rarely    . Multiple Vitamin (MULTIVITAMIN WITH MINERALS) TABS Take 1 tablet by mouth daily.    . naproxen sodium (ANAPROX) 220 MG tablet Take 500 mg by mouth daily as needed (pain). Reported on 04/24/2015    . nebivolol (BYSTOLIC) 5 MG tablet Take 5 mg by mouth daily.    Marland Kitchen oxyCODONE-acetaminophen (ENDOCET) 10-325 MG tablet Take 1 tablet by mouth every 4 (four) hours as needed for pain.    . potassium chloride SA (K-DUR,KLOR-CON) 20 MEQ tablet Take 20 mEq by mouth daily.    . simvastatin (ZOCOR) 10 MG tablet Take 10 mg by mouth daily.    Marland Kitchen topiramate (TOPAMAX) 100 MG tablet Take 100 mg by mouth 2 (two) times daily.    Marland Kitchen torsemide (DEMADEX) 20 MG  tablet Take 40 mg by mouth daily.    . TURMERIC PO Take by mouth.    . Vitamin D, Ergocalciferol, (DRISDOL) 50000 UNITS CAPS Take 50,000 Units by mouth 2 (two) times a week. On Tuesday and Thursday     No current facility-administered medications on file prior to visit.     ALLERGIES: Allergies  Allergen Reactions  . Sulfa Antibiotics Swelling    Tongue swelling  . Penicillins Hives, Itching and Rash    FAMILY HISTORY: Family History  Problem Relation Age of Onset  . Colon cancer Neg Hx     SOCIAL HISTORY: Social History   Social History  . Marital status: Married    Spouse name: N/A  . Number of children:  N/A  . Years of education: N/A   Occupational History  . Not on file.   Social History Main Topics  . Smoking status: Former Smoker    Packs/day: 0.50    Years: 10.00    Types: Cigarettes    Quit date: 04/21/1987  . Smokeless tobacco: Never Used  . Alcohol use 1.2 oz/week    2 Glasses of wine per week  . Drug use: No  . Sexual activity: No   Other Topics Concern  . Not on file   Social History Narrative   12th grade education. Lives with husband.     REVIEW OF SYSTEMS: Constitutional: No fevers, chills, or sweats, no generalized fatigue, change in appetite Eyes: No visual changes, double vision, eye pain Ear, nose and throat: No hearing loss, ear pain, nasal congestion, sore throat Cardiovascular: No chest pain, palpitations Respiratory:  No shortness of breath at rest or with exertion, wheezes GastrointestinaI: No nausea, vomiting, diarrhea, abdominal pain, fecal incontinence Genitourinary:  No dysuria, urinary retention or frequency Musculoskeletal:  Neck pain Integumentary: No rash, pruritus, skin lesions Neurological: as above Psychiatric: No depression, insomnia, anxiety Endocrine: No palpitations, fatigue, diaphoresis, mood swings, change in appetite, change in weight, increased thirst Hematologic/Lymphatic:  No purpura,  petechiae. Allergic/Immunologic: no itchy/runny eyes, nasal congestion, recent allergic reactions, rashes  PHYSICAL EXAM: Vitals:   08/25/16 1054  BP: 130/80  Pulse: (!) 52   General: No acute distress.  Patient appears well-groomed.   Head:  Normocephalic/atraumatic Eyes:  Fundi examined but not visualized Neck: supple, left paraspinal tenderness, full range of motion Heart:  Regular rate and rhythm Lungs:  Clear to auscultation bilaterally Back: No paraspinal tenderness Neurological Exam: alert and oriented to person, place, and time. Attention span and concentration intact, recent and remote memory intact, fund of knowledge intact.  Speech fluent and not dysarthric, language intact.  CN II-XII intact. Bulk and tone normal, muscle strength 5-/5 throughout.  Sensation to light touch reduced in first 2 digits of right hand and entire left hand.  Deep tendon reflexes 2+ throughout, toes downgoing.  Finger to nose testing intact.  Gait wide-based.  Positive Tinel's at wrist bilaterally.  IMPRESSION: 1.  Migraine  2.  Bilateral carpal tunnel syndrome 3.  Left sided numbness/weakness (chronic).  Possibly remote stroke but uncertain.  She does have cerebrovascular disease 4.  Left cervical radiculopathy.  PLAN: 1.  Continue topiramate 100mg  twice daily 2.  Follow up for appointment with Dr. Amedeo Plenty for carpal tunnel syndrome.  Use wrist splints in meantime. 3.  ASA and statin therapy for secondary stroke prevention 4.  Follow up in 6 months.  25 minutes spent face to face with patient, over 50% spent discussing management.  Metta Clines, DO  CC:  Octavio Graves, DO

## 2016-08-25 NOTE — Patient Instructions (Addendum)
1.  Continue topiramate 100mg  twice daily.   2.  You have an appointment for carpal tunnel syndrome with Dr. Amedeo Plenty at [facility] on August 2 at 10 AM.  Address:  [place]  Phone:  [phone] 3.  Take aspirin 81mg  daily and simvastatin 4.  Follow up in 6 months.

## 2016-09-18 ENCOUNTER — Telehealth: Payer: Self-pay | Admitting: Neurology

## 2016-09-18 NOTE — Telephone Encounter (Signed)
PT called and said her EMG results were never sent to [place] and she said she needs them sent

## 2016-09-18 NOTE — Telephone Encounter (Signed)
EMG faxed.  

## 2017-02-26 ENCOUNTER — Ambulatory Visit: Payer: Medicare Other | Admitting: Neurology

## 2017-04-19 ENCOUNTER — Encounter: Payer: Self-pay | Admitting: Internal Medicine

## 2017-05-14 ENCOUNTER — Telehealth: Payer: Self-pay

## 2017-05-14 NOTE — Telephone Encounter (Signed)
DONE

## 2017-05-14 NOTE — Telephone Encounter (Signed)
Pt is on nurse schedule to triage for 3 year repeat tcs- Per EG- pt needs OV to set up tcs d/t polypharmacy. Please schedule and cancel nurse visit.

## 2017-05-17 NOTE — Progress Notes (Signed)
Primary Care Physician:  Octavio Graves, DO Primary Gastroenterologist:  Dr. Gala Romney  Chief Complaint  Patient presents with  . Colonoscopy    consult    HPI:   Holly Flores is a 72 y.o. female who presents to schedule colonoscopy.  Nurse triage was deferred to office visit due to chronic medications.  Noted history of multiple colonic adenomas.  Last colonoscopy completed 05/31/2014 which found a total of 7 adenomas, one of which is a 5 mm x 7 mm carpet polyp in the base of the cecum.  The others were diminutive.  Noted 1.5 cm yellowish submucosal mass in the transverse colon with a positive pillow sign consistent with lipoma.  Surgical pathology found the polyps to be tubular adenoma without high-grade dysplasia or malignancy.  Recommended repeat colonoscopy in 3 years.  After triage she was noted to be on Xanax, Robaxin, oxycodone.  Today she states she's doing ok overall. Has intermittent headaches. Denies ongoing abdominal, N/V, hematochezia, melena, fever, chills, unintentional weight loss. Denies chest pain, dyspnea, dizziness, lightheadedness, syncope, near syncope. Denies any other upper or lower GI symptoms.  Past Medical History:  Diagnosis Date  . Anxiety   . Arthritis   . CHF (congestive heart failure) ([place])   . COPD (chronic obstructive pulmonary disease) ([place])   . Depression   . GERD (gastroesophageal reflux disease)   . Goiter   . Hyperlipidemia   . Hypertension   . Hypothyroidism   . Kidney cysts    left side  . Myocardial infarction ([place])    8 yrs ago.   Marland Kitchen Shortness of breath   . Sleep apnea    uses CPAP, 3  . Stroke ([place])   . Stroke ([place])    OCULAR  LEFT  EYE    LAST YR.    Past Surgical History:  Procedure Laterality Date  . ABDOMINAL HYSTERECTOMY    . BACK SURGERY     x3,cervical and lunmbar, disc.  Marland Kitchen BREAST BIOPSY     left-non cancerous  . CARDIAC CATHETERIZATION    . CARPAL TUNNEL RELEASE Left   . CATARACT EXTRACTION W/PHACO  09/21/2011   Procedure: CATARACT EXTRACTION PHACO AND INTRAOCULAR LENS PLACEMENT (IOC);  Surgeon: Williams Che, MD;  Location: [facility];  Service: Ophthalmology;  Laterality: Left;  CDE:9.78  . CATARACT EXTRACTION W/PHACO Right 10/15/2014   Procedure: CATARACT EXTRACTION PHACO AND INTRAOCULAR LENS PLACEMENT ([place]);  Surgeon: Williams Che, MD;  Location: [facility];  Service: Ophthalmology;  Laterality: Right;  CDE:4.19  . CERVICAL SPINE SURGERY    . CESAREAN SECTION     x2  . CHOLECYSTECTOMY    . COLONOSCOPY  2006   RMR: 1. Internal hemorroids, otherwise normal rectum. 2. Pedunculated polyp at 35 cm. reomved with snare. The remainder of teh colonic mucosa appeared normal.   . COLONOSCOPY  2011   Dr. Gala Romney: multiple ascending colon polyps and rectal polyp, adenomatous  . COLONOSCOPY N/A 05/31/2014   Procedure: COLONOSCOPY;  Surgeon: Daneil Dolin, MD;  Location: [facility];  Service: Endoscopy;  Laterality: N/A;  130  . ESOPHAGOGASTRODUODENOSCOPY  2006   Dr. Gala Romney: Subtle Schatzkis ring, otherwise normal upper GI tract, aside from a small pyloric channell erosion, status post dilation as described above.   Marland Kitchen KNEE SURGERY Bilateral   . TONSILLECTOMY    . TOTAL KNEE ARTHROPLASTY Right 05/24/2013   DR MURPHY  . TOTAL KNEE ARTHROPLASTY Right 05/24/2013   Procedure: TOTAL KNEE ARTHROPLASTY;  Surgeon:  [name], MD;  Location: [facility];  Service: Orthopedics;  Laterality: Right;    Current Outpatient Medications  Medication Sig Dispense Refill  . ALPRAZolam (XANAX) 1 MG tablet Take 1 mg by mouth at bedtime.    Marland Kitchen esomeprazole (NEXIUM) 40 MG capsule Take 40 mg by mouth daily as needed (heartburn). For heartburn    . levothyroxine (LEVOTHROID) 50 MCG tablet Take 50 mcg by mouth daily before breakfast.    . MEGARED OMEGA-3 KRILL OIL 500 MG CAPS Take 1 capsule by mouth as needed.     Marland Kitchen oxyCODONE-acetaminophen (ENDOCET) 10-325 MG tablet Take 1 tablet by mouth every 4 (four) hours as needed for pain.    .  potassium chloride SA (K-DUR,KLOR-CON) 20 MEQ tablet Take 20 mEq by mouth daily.    . simvastatin (ZOCOR) 10 MG tablet Take 40 mg by mouth daily.     Marland Kitchen topiramate (TOPAMAX) 100 MG tablet Take 100 mg by mouth 2 (two) times daily.    Marland Kitchen torsemide (DEMADEX) 20 MG tablet Take 40 mg by mouth daily.    . Vitamin D, Ergocalciferol, (DRISDOL) 50000 UNITS CAPS Take 50,000 Units by mouth 2 (two) times a week. On Tuesday and Thursday     No current facility-administered medications for this visit.     Allergies as of 05/18/2017 - Review Complete 05/18/2017  Allergen Reaction Noted  . Sulfa antibiotics Swelling 08/12/2011  . Penicillins Hives, Itching, and Rash 08/12/2011    Family History  Problem Relation Age of Onset  . Colon cancer Neg Hx     Social History   Socioeconomic History  . Marital status: Married    Spouse name: Not on file  . Number of children: Not on file  . Years of education: Not on file  . Highest education level: Not on file  Occupational History  . Not on file  Social Needs  . Financial resource strain: Not on file  . Food insecurity:    Worry: Not on file    Inability: Not on file  . Transportation needs:    Medical: Not on file    Non-medical: Not on file  Tobacco Use  . Smoking status: Current Some Day Smoker    Packs/day: 0.50    Years: 10.00    Pack years: 5.00    Types: Cigarettes    Last attempt to quit: 04/21/1987    Years since quitting: 30.0  . Smokeless tobacco: Never Used  . Tobacco comment: smokes cigarette if she has a headache which helps.  Substance and Sexual Activity  . Alcohol use: Not Currently    Comment: previously 2 glasses of wine a day  . Drug use: No  . Sexual activity: Never    Birth control/protection: Post-menopausal, Surgical  Lifestyle  . Physical activity:    Days per week: Not on file    Minutes per session: Not on file  . Stress: Not on file  Relationships  . Social connections:    Talks on phone: Not on file     Gets together: Not on file    Attends religious service: Not on file    Active member of club or organization: Not on file    Attends meetings of clubs or organizations: Not on file    Relationship status: Not on file  . Intimate partner violence:    Fear of current or ex partner: Not on file    Emotionally abused: Not on file    Physically abused: Not  on file    Forced sexual activity: Not on file  Other Topics Concern  . Not on file  Social History Narrative   12th grade education. Lives with husband.     Review of Systems: General: Negative for anorexia, weight loss, fever, chills, fatigue, weakness. ENT: Negative for hoarseness, difficulty swallowing. CV: Negative for chest pain, angina, palpitations, peripheral edema.  Respiratory: Negative for dyspnea at rest, cough, sputum, wheezing.  GI: See history of present illness. Endo: Negative for unusual weight change.  Heme: Negative for bruising or bleeding.   Physical Exam: BP (!) 147/92   Pulse 69   Temp (!) 97.3 F (36.3 C) (Oral)   Ht 5\' 1"  (1.549 m)   Wt 220 lb 12.8 oz (100.2 kg)   BMI 41.72 kg/m  General:   Alert and oriented. Pleasant and cooperative. Well-nourished and well-developed.  Eyes:  Without icterus, sclera clear and conjunctiva pink.  Ears:  Normal auditory acuity. Cardiovascular:  S1, S2 present without murmurs appreciated. Extremities without clubbing or edema. Respiratory:  Clear to auscultation bilaterally. No wheezes, rales, or rhonchi. No distress.  Gastrointestinal:  +BS, rounded but soft, non-tender and non-distended. No HSM noted. No guarding or rebound. No masses appreciated.  Rectal:  Deferred  Musculoskalatal:  Symmetrical without gross deformities. Neurologic:  Alert and oriented x4;  grossly normal neurologically. Psych:  Alert and cooperative. Normal mood and affect. Heme/Lymph/Immune: No excessive bruising noted.    05/18/2017 10:17 AM   Disclaimer: This note was dictated with voice  recognition software. Similar sounding words can inadvertently be transcribed and may not be corrected upon review.

## 2017-05-18 ENCOUNTER — Ambulatory Visit (INDEPENDENT_AMBULATORY_CARE_PROVIDER_SITE_OTHER): Payer: Medicare Other | Admitting: Nurse Practitioner

## 2017-05-18 ENCOUNTER — Encounter: Payer: Self-pay | Admitting: Nurse Practitioner

## 2017-05-18 ENCOUNTER — Other Ambulatory Visit: Payer: Self-pay

## 2017-05-18 ENCOUNTER — Telehealth: Payer: Self-pay

## 2017-05-18 VITALS — BP 147/92 | HR 69 | Temp 97.3°F | Ht 61.0 in | Wt 220.8 lb

## 2017-05-18 DIAGNOSIS — Z86010 Personal history of colonic polyps: Secondary | ICD-10-CM | POA: Diagnosis not present

## 2017-05-18 DIAGNOSIS — R69 Illness, unspecified: Secondary | ICD-10-CM | POA: Diagnosis not present

## 2017-05-18 MED ORDER — NA SULFATE-K SULFATE-MG SULF 17.5-3.13-1.6 GM/177ML PO SOLN: 1.0000 | ORAL | 0 refills | Status: DC

## 2017-05-18 NOTE — Progress Notes (Signed)
cc'd to pcp 

## 2017-05-18 NOTE — Patient Instructions (Signed)
1. We will schedule your procedure for you. 2. Further recommendations will be made after your procedure. 3. Return for follow-up based on recommendations made after your procedure. 4. Call us if you have any questions or concerns.    It was great meeting you today! Hopefully the cold will go away soon so you can enjoy Spring and Summer!!    At [facility] we value your feedback. You may receive a survey about your visit today. Please share your experience as we strive to create trusing relationships with our patients to provide genuine, compassionate, quality care.

## 2017-05-18 NOTE — Assessment & Plan Note (Signed)
Patient is on multiple medications that can make sedation difficult including Xanax and oxycodone.  Given this, we will plan for propofol/MAC for her procedure.  Follow-up as needed.

## 2017-05-18 NOTE — Assessment & Plan Note (Addendum)
Noted history of multiple colon polyps.  Last colonoscopy 2016 with a total of 7 adenomas.  The base of the cecum polyp with a 5 mm x 7 mm carpet polyp.  Recommended repeat 3-year colonoscopy.  She is currently due.  She was brought into the office versus phone triage due to significant medications to assess need for augmented sedation and/or candidacy for colonoscopy.  I find no significant contraindications to proceeding with colonoscopy at this time.  Proceed with TCS on propofol/MAC with Dr. Gala Romney in near future: the risks, benefits, and alternatives have been discussed with the patient in detail. The patient states understanding and desires to proceed.  The patient is currently on Xanax, oxycodone.  No other anticoagulants, anxiolytics, chronic pain medications, or antidepressants.  We will plan for the procedure on propofol/MAC to promote adequate sedation.

## 2017-05-18 NOTE — Telephone Encounter (Signed)
Tried to call pt to inform of pre-op appt 06/18/17 at 1:45pm, no answer, LMOAM. Letter mailed.

## 2017-05-19 ENCOUNTER — Ambulatory Visit: Payer: Medicare Other

## 2017-05-20 ENCOUNTER — Encounter: Payer: Self-pay | Admitting: Cardiology

## 2017-06-11 ENCOUNTER — Encounter: Payer: Self-pay | Admitting: *Deleted

## 2017-06-14 ENCOUNTER — Ambulatory Visit (INDEPENDENT_AMBULATORY_CARE_PROVIDER_SITE_OTHER): Payer: Medicare Other | Admitting: Cardiology

## 2017-06-14 ENCOUNTER — Encounter: Payer: Self-pay | Admitting: Cardiology

## 2017-06-14 VITALS — BP 147/83 | HR 74 | Ht 61.0 in | Wt 226.2 lb

## 2017-06-14 DIAGNOSIS — R079 Chest pain, unspecified: Secondary | ICD-10-CM

## 2017-06-14 DIAGNOSIS — R55 Syncope and collapse: Secondary | ICD-10-CM

## 2017-06-14 NOTE — Patient Instructions (Signed)
Medication Instructions:  Your physician recommends that you continue on your current medications as directed. Please refer to the Current Medication list given to you today.  Labwork: NONE  Testing/Procedures: Your physician has requested that you have an echocardiogram. Echocardiography is a painless test that uses sound waves to create images of your heart. It provides your doctor with information about the size and shape of your heart and how well your heart's chambers and valves are working. This procedure takes approximately one hour. There are no restrictions for this procedure.  Follow-Up: Your physician recommends that you schedule a follow-up appointment PENDING TEST RESULTS   Any Other Special Instructions Will Be Listed Below (If Applicable).  If you need a refill on your cardiac medications before your next appointment, please call your pharmacy. 

## 2017-06-14 NOTE — Progress Notes (Signed)
Clinical Summary Holly Flores is a 72 y.o.female seen as new consult. Last seen in 2015. Referred by Dr Melina Copa for ches tpain.     1. 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 Repeat cath 10/2001 in setting of chest pain with LM normal, LAD 67-61% mid systolic bridge, LCX patent, RCA patent. LVEF 60% by LVgram.    - admit 05/2017 to [facility] with chest pain and syncope - Trops negative. CT PE negative - no recurrent symptoms.   2. Syncope - multiple recent episodes over the last 6 months.  - no specific pattern or trigger - can have pain in left arm, radiates into neck. Blacks out. - mainly occurs with activity.  - occasional palpitations, though rare.    3. Night time hypoxia - wears O2 at night.     Past Medical History:  Diagnosis Date  . Anxiety   . Arthritis   . CHF (congestive heart failure) ([place])   . COPD (chronic obstructive pulmonary disease) ([place])   . Depression   . GERD (gastroesophageal reflux disease)   . Goiter   . Hyperlipidemia   . Hypertension   . Hypothyroidism   . Kidney cysts    left side  . Myocardial infarction ([place])    8 yrs ago.   Marland Kitchen Shortness of breath   . Sleep apnea    uses CPAP, 3  . Stroke ([place])   . 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     Allergies  Allergen Reactions  . Sulfa Antibiotics Swelling    Tongue swelling  . Penicillins Hives, Itching and Rash     Current Outpatient Medications  Medication Sig Dispense Refill  . ALPRAZolam (XANAX) 1 MG tablet Take 1 mg by mouth at bedtime.    Marland Kitchen esomeprazole (NEXIUM) 40 MG capsule Take 40 mg by mouth daily as needed (heartburn). For heartburn    . levothyroxine (LEVOTHROID) 50 MCG tablet Take 50 mcg by mouth daily before breakfast.    . MEGARED OMEGA-3 KRILL OIL 500 MG CAPS Take 1 capsule by mouth as needed.     . Na Sulfate-K Sulfate-Mg Sulf (SUPREP BOWEL PREP KIT) 17.5-3.13-1.6 GM/177ML SOLN Take 1 kit by mouth as directed. 1 Bottle 0  .  oxyCODONE-acetaminophen (ENDOCET) 10-325 MG tablet Take 1 tablet by mouth every 4 (four) hours as needed for pain.    . potassium chloride SA (K-DUR,KLOR-CON) 20 MEQ tablet Take 20 mEq by mouth daily.    . simvastatin (ZOCOR) 10 MG tablet Take 40 mg by mouth daily.     Marland Kitchen topiramate (TOPAMAX) 100 MG tablet Take 100 mg by mouth 2 (two) times daily.    Marland Kitchen torsemide (DEMADEX) 20 MG tablet Take 40 mg by mouth daily.    . Vitamin D, Ergocalciferol, (DRISDOL) 50000 UNITS CAPS Take 50,000 Units by mouth 2 (two) times a week. On Tuesday and Thursday     No current facility-administered medications for this visit.      Past Surgical History:  Procedure Laterality Date  . ABDOMINAL HYSTERECTOMY    . BACK SURGERY     x3,cervical and lunmbar, disc.  Marland Kitchen BREAST BIOPSY     left-non cancerous  . CARDIAC CATHETERIZATION    . CARPAL TUNNEL RELEASE Left   . CATARACT EXTRACTION W/PHACO  09/21/2011   Procedure: CATARACT EXTRACTION PHACO AND INTRAOCULAR LENS PLACEMENT (IOC);  Surgeon: Williams Che, MD;  Location: [facility]  ORS;  Service: Ophthalmology;  Laterality: Left;  CDE:9.78  . CATARACT EXTRACTION W/PHACO Right 10/15/2014   Procedure: CATARACT EXTRACTION PHACO AND INTRAOCULAR LENS PLACEMENT ([place]);  Surgeon: Williams Che, MD;  Location: [facility] ORS;  Service: Ophthalmology;  Laterality: Right;  CDE:4.19  . CERVICAL SPINE SURGERY    . CESAREAN SECTION     x2  . CHOLECYSTECTOMY    . COLONOSCOPY  2006   RMR: 1. Internal hemorroids, otherwise normal rectum. 2. Pedunculated polyp at 35 cm. reomved with snare. The remainder of teh colonic mucosa appeared normal.   . COLONOSCOPY  2011   Dr. Gala Romney: multiple ascending colon polyps and rectal polyp, adenomatous  . COLONOSCOPY N/A 05/31/2014   Procedure: COLONOSCOPY;  Surgeon: Daneil Dolin, MD;  Location: [facility] ENDO SUITE;  Service: Endoscopy;  Laterality: N/A;  130  . ESOPHAGOGASTRODUODENOSCOPY  2006   Dr. Gala Romney: Subtle Schatzkis ring, otherwise normal upper GI tract,  aside from a small pyloric channell erosion, status post dilation as described above.   Marland Kitchen KNEE SURGERY Bilateral   . TONSILLECTOMY    . TOTAL KNEE ARTHROPLASTY Right 05/24/2013   DR MURPHY  . TOTAL KNEE ARTHROPLASTY Right 05/24/2013   Procedure: TOTAL KNEE ARTHROPLASTY;  Surgeon: [name], MD;  Location: [place];  Service: Orthopedics;  Laterality: Right;     Allergies  Allergen Reactions  . Sulfa Antibiotics Swelling    Tongue swelling  . Penicillins Hives, Itching and Rash      Family History  Problem Relation Age of Onset  . Cancer Other   . Diabetes Other   . Heart disease Other   . Hypertension Other   . Asthma Other   . Alcoholism Other   . Thyroid disease Other   . Rheumatologic disease Other   . Stroke Other   . Hypercholesterolemia Other   . Colon cancer Neg Hx      Social History Holly Flores reports that she has been smoking cigarettes.  She has a 5.00 pack-year smoking history. She has never used smokeless tobacco. Holly Flores reports that she drank alcohol.   Review of Systems CONSTITUTIONAL: No weight loss, fever, chills, weakness or fatigue.  HEENT: Eyes: No visual loss, blurred vision, double vision or yellow sclerae.No hearing loss, sneezing, congestion, runny nose or sore throat.  SKIN: No rash or itching.  CARDIOVASCULAR: per hpi RESPIRATORY: No shortness of breath, cough or sputum.  GASTROINTESTINAL: No anorexia, nausea, vomiting or diarrhea. No abdominal pain or blood.  GENITOURINARY: No burning on urination, no polyuria NEUROLOGICAL: per hpi MUSCULOSKELETAL: No muscle, back pain, joint pain or stiffness.  LYMPHATICS: No enlarged nodes. No history of splenectomy.  PSYCHIATRIC: No history of depression or anxiety.  ENDOCRINOLOGIC: No reports of sweating, cold or heat intolerance. No polyuria or polydipsia.  Marland Kitchen   Physical Examination Vitals:   06/14/17 1011 06/14/17 1014  BP: 121/79 (!) 147/83  Pulse: 75 74  SpO2: 97% 97%   Vitals:     06/14/17 1003  Weight: 226 lb 3.2 oz (102.6 kg)  Height: '5\' 1"'$  (1.549 m)    Gen: resting comfortably, no acute distress HEENT: no scleral icterus, pupils equal round and reactive, no palptable cervical adenopathy,  CV: RRR, no m/r/g, no jvd Resp: Clear to auscultation bilaterally GI: abdomen is soft, non-tender, non-distended, normal bowel sounds, no hepatosplenomegaly MSK: extremities are warm, no edema.  Skin: warm, no rash Neuro:  no focal deficits Psych: appropriate affect   Diagnostic Studies 10/2011 cath HEMODYNAMICS: Aortic systolic  pressure: 123.  Aortic diastolic pressure: 78.  Left ventricular systolic pressure: 953.  Left ventricular diastolic pressure: 18.  CORONARY ANGIOGRAPHY:  1. Left main: The left main normal.  2. LAD: The LAD had a 69%-22% mid systolic bridge after a diagonal Muscab Brenneman.  3. Left circumflex coronary artery: Free of significant disease.  4. Right coronary artery: This a large dominant vessel which was free of  significant disease.  LEFT VENTRICULOGRAPHY: The RAO left ventriculogram was performed using 25  cc of Omnipaque dye at 12 cc per second. The overall LVEF was estimated at  greater than 60% without focal wall motion abnormality.  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 I suspect that her chest  pain is related to cholelithiasis.  The sheath was removed and pressure held on the groin to achieve hemostasis.  The patient left the cath lab in stable condition.  DISPOSITION: She will be discharged home today to be followed as an  outpatient. She will see me back in the office in several weeks. She is  cleared to undergo an elective cholecystectomy, at a low cardiovascular  risk. She left the laboratory in stable condition. Dr. Octavio Graves was  notified of these results.   12/2014  echo ------------------------------------------------------------------- Study Conclusions  - Left ventricle: The cavity size was normal. Wall thickness was   normal. Systolic function was vigorous. The estimated ejection   fraction was in the range of 65% to 70%. Doppler parameters are   consistent with abnormal left ventricular relaxation (grade 1   diastolic dysfunction). - Left atrium: The atrium was mildly dilated.  Assessment and Plan  1. Chest pain - prior cath with myocardial bridinging, no significant CAD - recent symptoms atypical, not consistent with cardiac ischemia - would not repeat ischemic testing at this time.    2. Syncope  normal orthostatics today - unclear etiology, obtain echo. Pending results may consider cardiac monitor.   F/u  Pending echo results.         Arnoldo Lenis, M.D.

## 2017-06-16 NOTE — Patient Instructions (Signed)
Holly Flores  06/16/2017     @PREFPERIOPPHARMACY @   Your procedure is scheduled on  06/24/2017 .  Report to [facility] at  1215   P.M.  Call this number if you have problems the morning of surgery:  [phone]   Remember:  Do not eat food or drink liquids after midnight.  Take these medicines the morning of surgery with A SIP OF WATER  Nexium, levothyroxine, oxycodone, topamax. Use your inhaler before you come and bring it with you.   Do not wear jewelry, make-up or nail polish.  Do not wear lotions, powders, or perfumes, or deodorant.  Do not shave 48 hours prior to surgery.  Men may shave face and neck.  Do not bring valuables to the hospital.  [facility] is not responsible for any belongings or valuables.  Contacts, dentures or bridgework may not be worn into surgery.  Leave your suitcase in the car.  After surgery it may be brought to your room.  For patients admitted to the hospital, discharge time will be determined by your treatment team.  Patients discharged the day of surgery will not be allowed to drive home.   Name and phone number of your driver:   family Special instructions:  Follow the diet and prep instructions given to you by Dr Roseanne Kaufman office.  Please read over the following fact sheets that you were given. Anesthesia Post-op Instructions and Care and Recovery After Surgery       Colonoscopy, Adult A colonoscopy is an exam to look at the large intestine. It is done to check for problems, such as:  Lumps (tumors).  Growths (polyps).  Swelling (inflammation).  Bleeding.  What happens before the procedure? Eating and drinking Follow instructions from your doctor about eating and drinking. These instructions may include:  A few days before the procedure - follow a low-fiber diet. ? Avoid nuts. ? Avoid seeds. ? Avoid dried fruit. ? Avoid raw fruits. ? Avoid vegetables.  1-3 days before the procedure - follow a clear liquid  diet. Avoid liquids that have red or purple dye. Drink only clear liquids, such as: ? Clear broth or bouillon. ? Black coffee or tea. ? Clear juice. ? Clear soft drinks or sports drinks. ? Gelatin dessert. ? Popsicles.  On the day of the procedure - do not eat or drink anything during the 2 hours before the procedure.  Bowel prep If you were prescribed an oral bowel prep:  Take it as told by your doctor. Starting the day before your procedure, you will need to drink a lot of liquid. The liquid will cause you to poop (have bowel movements) until your poop is almost clear or light green.  If your skin or butt gets irritated from diarrhea, you may: ? Wipe the area with wipes that have medicine in them, such as adult wet wipes with aloe and vitamin E. ? Put something on your skin that soothes the area, such as petroleum jelly.  If you throw up (vomit) while drinking the bowel prep, take a break for up to 60 minutes. Then begin the bowel prep again. If you keep throwing up and you cannot take the bowel prep without throwing up, call your doctor.  General instructions  Ask your doctor about changing or stopping your normal medicines. This is important if you take diabetes medicines or blood thinners.  Plan to have someone take you home from the  hospital or clinic. What happens during the procedure?  An IV tube may be put into one of your veins.  You will be given medicine to help you relax (sedative).  To reduce your risk of infection: ? Your doctors will wash their hands. ? Your anal area will be washed with soap.  You will be asked to lie on your side with your knees bent.  Your doctor will get a long, thin, flexible tube ready. The tube will have a camera and a light on the end.  The tube will be put into your anus.  The tube will be gently put into your large intestine.  Air will be delivered into your large intestine to keep it open. You may feel some pressure or  cramping.  The camera will be used to take photos.  A small tissue sample may be removed from your body to be looked at under a microscope (biopsy). If any possible problems are found, the tissue will be sent to a lab for testing.  If small growths are found, your doctor may remove them and have them checked for cancer.  The tube that was put into your anus will be slowly removed. The procedure may vary among doctors and hospitals. What happens after the procedure?  Your doctor will check on you often until the medicines you were given have worn off.  Do not drive for 24 hours after the procedure.  You may have a small amount of blood in your poop.  You may pass gas.  You may have mild cramps or bloating in your belly (abdomen).  It is up to you to get the results of your procedure. Ask your doctor, or the department performing the procedure, when your results will be ready. This information is not intended to replace advice given to you by your health care provider. Make sure you discuss any questions you have with your health care provider. Document Released: 03/07/2010 Document Revised: 12/04/2015 Document Reviewed: 04/16/2015 Elsevier Interactive Patient Education  2017 [employer]  Colonoscopy, Adult, Care After This sheet gives you information about how to care for yourself after your procedure. Your health care provider may also give you more specific instructions. If you have problems or questions, contact your health care provider. What can I expect after the procedure? After the procedure, it is common to have:  A small amount of blood in your stool for 24 hours after the procedure.  Some gas.  Mild abdominal cramping or bloating.  Follow these instructions at home: General instructions   For the first 24 hours after the procedure: ? Do not drive or use machinery. ? Do not sign important documents. ? Do not drink alcohol. ? Do your regular daily activities  at a slower pace than normal. ? Eat soft, easy-to-digest foods. ? Rest often.  Take over-the-counter or prescription medicines only as told by your health care provider.  It is up to you to get the results of your procedure. Ask your health care provider, or the department performing the procedure, when your results will be ready. Relieving cramping and bloating  Try walking around when you have cramps or feel bloated.  Apply heat to your abdomen as told by your health care provider. Use a heat source that your health care provider recommends, such as a moist heat pack or a heating pad. ? Place a towel between your skin and the heat source. ? Leave the heat on for 20-30 minutes. ? Remove the  heat if your skin turns bright red. This is especially important if you are unable to feel pain, heat, or cold. You may have a greater risk of getting burned. Eating and drinking  Drink enough fluid to keep your urine clear or pale yellow.  Resume your normal diet as instructed by your health care provider. Avoid heavy or fried foods that are hard to digest.  Avoid drinking alcohol for as long as instructed by your health care provider. Contact a health care provider if:  You have blood in your stool 2-3 days after the procedure. Get help right away if:  You have more than a small spotting of blood in your stool.  You pass large blood clots in your stool.  Your abdomen is swollen.  You have nausea or vomiting.  You have a fever.  You have increasing abdominal pain that is not relieved with medicine. This information is not intended to replace advice given to you by your health care provider. Make sure you discuss any questions you have with your health care provider. Document Released: 09/17/2003 Document Revised: 10/28/2015 Document Reviewed: 04/16/2015 Elsevier Interactive Patient Education  2018 [place] Anesthesia is a term that refers to techniques,  procedures, and medicines that help a person stay safe and comfortable during a medical procedure. Monitored anesthesia care, or sedation, is one type of anesthesia. Your anesthesia specialist may recommend sedation if you will be having a procedure that does not require you to be unconscious, such as:  Cataract surgery.  A dental procedure.  A biopsy.  A colonoscopy.  During the procedure, you may receive a medicine to help you relax (sedative). There are three levels of sedation:  Mild sedation. At this level, you may feel awake and relaxed. You will be able to follow directions.  Moderate sedation. At this level, you will be sleepy. You may not remember the procedure.  Deep sedation. At this level, you will be asleep. You will not remember the procedure.  The more medicine you are given, the deeper your level of sedation will be. Depending on how you respond to the procedure, the anesthesia specialist may change your level of sedation or the type of anesthesia to fit your needs. An anesthesia specialist will monitor you closely during the procedure. Let your health care provider know about:  Any allergies you have.  All medicines you are taking, including vitamins, herbs, eye drops, creams, and over-the-counter medicines.  Any use of steroids (by mouth or as a cream).  Any problems you or family members have had with sedatives and anesthetic medicines.  Any blood disorders you have.  Any surgeries you have had.  Any medical conditions you have, such as sleep apnea.  Whether you are pregnant or may be pregnant.  Any use of cigarettes, alcohol, or street drugs. What are the risks? Generally, this is a safe procedure. However, problems may occur, including:  Getting too much medicine (oversedation).  Nausea.  Allergic reaction to medicines.  Trouble breathing. If this happens, a breathing tube may be used to help with breathing. It will be removed when you are awake and  breathing on your own.  Heart trouble.  Lung trouble.  Before the procedure Staying hydrated Follow instructions from your health care provider about hydration, which may include:  Up to 2 hours before the procedure - you may continue to drink clear liquids, such as water, clear fruit juice, black coffee, and plain tea.  Eating and  drinking restrictions Follow instructions from your health care provider about eating and drinking, which may include:  8 hours before the procedure - stop eating heavy meals or foods such as meat, fried foods, or fatty foods.  6 hours before the procedure - stop eating light meals or foods, such as toast or cereal.  6 hours before the procedure - stop drinking milk or drinks that contain milk.  2 hours before the procedure - stop drinking clear liquids.  Medicines Ask your health care provider about:  Changing or stopping your regular medicines. This is especially important if you are taking diabetes medicines or blood thinners.  Taking medicines such as aspirin and ibuprofen. These medicines can thin your blood. Do not take these medicines before your procedure if your health care provider instructs you not to.  Tests and exams  You will have a physical exam.  You may have blood tests done to show: ? How well your kidneys and liver are working. ? How well your blood can clot.  General instructions  Plan to have someone take you home from the hospital or clinic.  If you will be going home right after the procedure, plan to have someone with you for 24 hours.  What happens during the procedure?  Your blood pressure, heart rate, breathing, level of pain and overall condition will be monitored.  An IV tube will be inserted into one of your veins.  Your anesthesia specialist will give you medicines as needed to keep you comfortable during the procedure. This may mean changing the level of sedation.  The procedure will be performed. After  the procedure  Your blood pressure, heart rate, breathing rate, and blood oxygen level will be monitored until the medicines you were given have worn off.  Do not drive for 24 hours if you received a sedative.  You may: ? Feel sleepy, clumsy, or nauseous. ? Feel forgetful about what happened after the procedure. ? Have a sore throat if you had a breathing tube during the procedure. ? Vomit. This information is not intended to replace advice given to you by your health care provider. Make sure you discuss any questions you have with your health care provider. Document Released: 10/29/2004 Document Revised: 07/12/2015 Document Reviewed: 05/26/2015 Elsevier Interactive Patient Education  2018 [place], Care After These instructions provide you with information about caring for yourself after your procedure. Your health care provider may also give you more specific instructions. Your treatment has been planned according to current medical practices, but problems sometimes occur. Call your health care provider if you have any problems or questions after your procedure. What can I expect after the procedure? After your procedure, it is common to:  Feel sleepy for several hours.  Feel clumsy and have poor balance for several hours.  Feel forgetful about what happened after the procedure.  Have poor judgment for several hours.  Feel nauseous or vomit.  Have a sore throat if you had a breathing tube during the procedure.  Follow these instructions at home: For at least 24 hours after the procedure:   Do not: ? Participate in activities in which you could fall or become injured. ? Drive. ? Use heavy machinery. ? Drink alcohol. ? Take sleeping pills or medicines that cause drowsiness. ? Make important decisions or sign legal documents. ? Take care of children on your own.  Rest. Eating and drinking  Follow the diet that is recommended by your health  care provider.  If you vomit, drink water, juice, or soup when you can drink without vomiting.  Make sure you have little or no nausea before eating solid foods. General instructions  Have a responsible adult stay with you until you are awake and alert.  Take over-the-counter and prescription medicines only as told by your health care provider.  If you smoke, do not smoke without supervision.  Keep all follow-up visits as told by your health care provider. This is important. Contact a health care provider if:  You keep feeling nauseous or you keep vomiting.  You feel light-headed.  You develop a rash.  You have a fever. Get help right away if:  You have trouble breathing. This information is not intended to replace advice given to you by your health care provider. Make sure you discuss any questions you have with your health care provider. Document Released: 05/26/2015 Document Revised: 09/25/2015 Document Reviewed: 05/26/2015 Elsevier Interactive Patient Education  [employer].

## 2017-06-18 ENCOUNTER — Other Ambulatory Visit: Payer: Self-pay

## 2017-06-18 ENCOUNTER — Encounter (HOSPITAL_COMMUNITY)
Admission: RE | Admit: 2017-06-18 | Discharge: 2017-06-18 | Disposition: A | Discharge status: Home / Self Care | Payer: Medicare Other | Source: Ambulatory Visit | Attending: Internal Medicine | Admitting: Internal Medicine

## 2017-06-18 DIAGNOSIS — I444 Left anterior fascicular block: Secondary | ICD-10-CM | POA: Insufficient documentation

## 2017-06-18 DIAGNOSIS — Z01812 Encounter for preprocedural laboratory examination: Secondary | ICD-10-CM | POA: Insufficient documentation

## 2017-06-18 DIAGNOSIS — Z01810 Encounter for preprocedural cardiovascular examination: Secondary | ICD-10-CM | POA: Diagnosis present

## 2017-06-18 LAB — CBC WITH DIFFERENTIAL/PLATELET
BASOS ABS: 0 10*3/uL (ref 0.0–0.1)
BASOS PCT: 0 %
EOS ABS: 0.5 10*3/uL (ref 0.0–0.7)
EOS PCT: 7 %
HCT: 41.3 % (ref 36.0–46.0)
Hemoglobin: 13.3 g/dL (ref 12.0–15.0)
LYMPHS ABS: 3.6 10*3/uL (ref 0.7–4.0)
LYMPHS PCT: 47 %
MCH: 30 pg (ref 26.0–34.0)
MCHC: 32.2 g/dL (ref 30.0–36.0)
MCV: 93.2 fL (ref 78.0–100.0)
MONO ABS: 0.7 10*3/uL (ref 0.1–1.0)
Monocytes Relative: 8 %
NEUTROS ABS: 3 10*3/uL (ref 1.7–7.7)
NEUTROS PCT: 38 %
PLATELETS: 244 10*3/uL (ref 150–400)
RBC: 4.43 MIL/uL (ref 3.87–5.11)
RDW: 13.3 % (ref 11.5–15.5)
WBC: 7.9 10*3/uL (ref 4.0–10.5)

## 2017-06-18 LAB — BASIC METABOLIC PANEL
ANION GAP: 11 (ref 5–15)
BUN: 18 mg/dL (ref 6–20)
CALCIUM: 8.8 mg/dL — AB (ref 8.9–10.3)
CO2: 24 mmol/L (ref 22–32)
CREATININE: 1.39 mg/dL — AB (ref 0.44–1.00)
Chloride: 104 mmol/L (ref 101–111)
GFR, EST AFRICAN AMERICAN: 43 mL/min — AB (ref >60)
GFR, EST NON AFRICAN AMERICAN: 37 mL/min — AB (ref >60)
Glucose, Bld: 98 mg/dL (ref 65–99)
Potassium: 3.5 mmol/L (ref 3.5–5.1)
Sodium: 139 mmol/L (ref 135–145)

## 2017-06-19 ENCOUNTER — Encounter: Payer: Self-pay | Admitting: Cardiology

## 2017-06-24 ENCOUNTER — Encounter (HOSPITAL_COMMUNITY)
Admission: RE | Disposition: A | Discharge status: Home / Self Care | Payer: Self-pay | Source: Ambulatory Visit | Attending: Internal Medicine

## 2017-06-24 ENCOUNTER — Ambulatory Visit (HOSPITAL_COMMUNITY)
Admission: RE | Admit: 2017-06-24 | Discharge: 2017-06-24 | Disposition: A | Discharge status: Home / Self Care | Payer: Medicare Other | Source: Ambulatory Visit | Attending: Internal Medicine | Admitting: Internal Medicine

## 2017-06-24 ENCOUNTER — Ambulatory Visit (HOSPITAL_COMMUNITY): Payer: Medicare Other | Admitting: Anesthesiology

## 2017-06-24 ENCOUNTER — Encounter (HOSPITAL_COMMUNITY): Payer: Self-pay | Admitting: *Deleted

## 2017-06-24 DIAGNOSIS — Z7951 Long term (current) use of inhaled steroids: Secondary | ICD-10-CM | POA: Insufficient documentation

## 2017-06-24 DIAGNOSIS — I739 Peripheral vascular disease, unspecified: Secondary | ICD-10-CM | POA: Insufficient documentation

## 2017-06-24 DIAGNOSIS — F329 Major depressive disorder, single episode, unspecified: Secondary | ICD-10-CM | POA: Insufficient documentation

## 2017-06-24 DIAGNOSIS — Z90710 Acquired absence of both cervix and uterus: Secondary | ICD-10-CM | POA: Diagnosis not present

## 2017-06-24 DIAGNOSIS — Q6102 Congenital multiple renal cysts: Secondary | ICD-10-CM | POA: Diagnosis not present

## 2017-06-24 DIAGNOSIS — Z9049 Acquired absence of other specified parts of digestive tract: Secondary | ICD-10-CM | POA: Insufficient documentation

## 2017-06-24 DIAGNOSIS — Z96651 Presence of right artificial knee joint: Secondary | ICD-10-CM | POA: Insufficient documentation

## 2017-06-24 DIAGNOSIS — Z9842 Cataract extraction status, left eye: Secondary | ICD-10-CM | POA: Diagnosis not present

## 2017-06-24 DIAGNOSIS — Z9981 Dependence on supplemental oxygen: Secondary | ICD-10-CM | POA: Insufficient documentation

## 2017-06-24 DIAGNOSIS — M1990 Unspecified osteoarthritis, unspecified site: Secondary | ICD-10-CM | POA: Insufficient documentation

## 2017-06-24 DIAGNOSIS — D122 Benign neoplasm of ascending colon: Secondary | ICD-10-CM | POA: Diagnosis not present

## 2017-06-24 DIAGNOSIS — I110 Hypertensive heart disease with heart failure: Secondary | ICD-10-CM | POA: Insufficient documentation

## 2017-06-24 DIAGNOSIS — R51 Headache: Secondary | ICD-10-CM | POA: Insufficient documentation

## 2017-06-24 DIAGNOSIS — E039 Hypothyroidism, unspecified: Secondary | ICD-10-CM | POA: Diagnosis not present

## 2017-06-24 DIAGNOSIS — Z8673 Personal history of transient ischemic attack (TIA), and cerebral infarction without residual deficits: Secondary | ICD-10-CM | POA: Insufficient documentation

## 2017-06-24 DIAGNOSIS — J449 Chronic obstructive pulmonary disease, unspecified: Secondary | ICD-10-CM | POA: Diagnosis not present

## 2017-06-24 DIAGNOSIS — I509 Heart failure, unspecified: Secondary | ICD-10-CM | POA: Diagnosis not present

## 2017-06-24 DIAGNOSIS — F419 Anxiety disorder, unspecified: Secondary | ICD-10-CM | POA: Insufficient documentation

## 2017-06-24 DIAGNOSIS — E785 Hyperlipidemia, unspecified: Secondary | ICD-10-CM | POA: Diagnosis not present

## 2017-06-24 DIAGNOSIS — Z9841 Cataract extraction status, right eye: Secondary | ICD-10-CM | POA: Diagnosis not present

## 2017-06-24 DIAGNOSIS — Z86010 Personal history of colonic polyps: Secondary | ICD-10-CM | POA: Insufficient documentation

## 2017-06-24 DIAGNOSIS — Z1211 Encounter for screening for malignant neoplasm of colon: Secondary | ICD-10-CM | POA: Insufficient documentation

## 2017-06-24 DIAGNOSIS — I252 Old myocardial infarction: Secondary | ICD-10-CM | POA: Insufficient documentation

## 2017-06-24 DIAGNOSIS — G4730 Sleep apnea, unspecified: Secondary | ICD-10-CM | POA: Insufficient documentation

## 2017-06-24 DIAGNOSIS — Z87891 Personal history of nicotine dependence: Secondary | ICD-10-CM | POA: Diagnosis not present

## 2017-06-24 DIAGNOSIS — Z79899 Other long term (current) drug therapy: Secondary | ICD-10-CM | POA: Insufficient documentation

## 2017-06-24 DIAGNOSIS — K219 Gastro-esophageal reflux disease without esophagitis: Secondary | ICD-10-CM | POA: Diagnosis not present

## 2017-06-24 HISTORY — PX: COLONOSCOPY WITH PROPOFOL: SHX5780

## 2017-06-24 HISTORY — PX: POLYPECTOMY: SHX5525

## 2017-06-24 SURGERY — COLONOSCOPY WITH PROPOFOL
Anesthesia: General

## 2017-06-24 MED ORDER — PROPOFOL 10 MG/ML IV BOLUS
INTRAVENOUS | Status: AC
  Filled 2017-06-24: qty 40

## 2017-06-24 MED ORDER — CHLORHEXIDINE GLUCONATE CLOTH 2 % EX PADS: 6.0000 | MEDICATED_PAD | Freq: Once | CUTANEOUS | Status: DC

## 2017-06-24 MED ORDER — PROPOFOL 10 MG/ML IV BOLUS
INTRAVENOUS | Status: AC
  Filled 2017-06-24: qty 20

## 2017-06-24 MED ORDER — PROPOFOL 500 MG/50ML IV EMUL
INTRAVENOUS | Status: DC | PRN
  Administered 2017-06-24: 16:00:00 via INTRAVENOUS
  Administered 2017-06-24: 150 ug/kg/min via INTRAVENOUS

## 2017-06-24 MED ORDER — PROPOFOL 10 MG/ML IV BOLUS
INTRAVENOUS | Status: AC
  Filled 2017-06-24: qty 80

## 2017-06-24 MED ORDER — FENTANYL CITRATE (PF) 100 MCG/2ML IJ SOLN
INTRAMUSCULAR | Status: AC
  Filled 2017-06-24: qty 2

## 2017-06-24 MED ORDER — LACTATED RINGERS IV SOLN
INTRAVENOUS | Status: DC
  Administered 2017-06-24: 1000 mL via INTRAVENOUS

## 2017-06-24 NOTE — H&P (Signed)
_0 @   Primary Care Physician:  Octavio Graves, DO Primary Gastroenterologist:  Dr. Gala Romney  Pre-Procedure History & Physical: HPI:  Holly Flores is a 72 y.o. female here for  surveillance colonoscopy. History of multiple colonic polyps removed 2016.  Past Medical History:  Diagnosis Date  . Anxiety   . Arthritis   . CHF (congestive heart failure) ([place])   . COPD (chronic obstructive pulmonary disease) ([place])   . Depression   . GERD (gastroesophageal reflux disease)   . Goiter   . Hyperlipidemia   . Hypertension   . Hypothyroidism   . Kidney cysts    left side  . Myocardial infarction ([place])    8 yrs ago.   Marland Kitchen Shortness of breath   . Sleep apnea    uses CPAP, 3  . Stroke ([place])   . 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    Past Surgical History:  Procedure Laterality Date  . ABDOMINAL HYSTERECTOMY    . BACK SURGERY     x3,cervical and lunmbar, disc.  Marland Kitchen BREAST BIOPSY     left-non cancerous  . CARDIAC CATHETERIZATION    . CARPAL TUNNEL RELEASE Left   . CATARACT EXTRACTION W/PHACO  09/21/2011   Procedure: CATARACT EXTRACTION PHACO AND INTRAOCULAR LENS PLACEMENT ([facility]);  Surgeon: Williams Che, MD;  Location: [facility];  Service: Ophthalmology;  Laterality: Left;  CDE:9.78  . CATARACT EXTRACTION W/PHACO Right 10/15/2014   Procedure: CATARACT EXTRACTION PHACO AND INTRAOCULAR LENS PLACEMENT ([place]);  Surgeon: Williams Che, MD;  Location: [facility];  Service: Ophthalmology;  Laterality: Right;  CDE:4.19  . CERVICAL SPINE SURGERY    . CESAREAN SECTION     x2  . CHOLECYSTECTOMY    . COLONOSCOPY  2006   RMR: 1. Internal hemorroids, otherwise normal rectum. 2. Pedunculated polyp at 35 cm. reomved with snare. The remainder of teh colonic mucosa appeared normal.   . COLONOSCOPY  2011   Dr. Gala Romney: multiple ascending colon polyps and rectal polyp, adenomatous  . COLONOSCOPY N/A 05/31/2014   Procedure: COLONOSCOPY;  Surgeon: Daneil Dolin, MD;  Location: [facility];   Service: Endoscopy;  Laterality: N/A;  130  . ESOPHAGOGASTRODUODENOSCOPY  2006   Dr. Gala Romney: Subtle Schatzkis ring, otherwise normal upper GI tract, aside from a small pyloric channell erosion, status post dilation as described above.   Marland Kitchen KNEE SURGERY Bilateral   . TONSILLECTOMY    . TOTAL KNEE ARTHROPLASTY Right 05/24/2013   DR MURPHY  . TOTAL KNEE ARTHROPLASTY Right 05/24/2013   Procedure: TOTAL KNEE ARTHROPLASTY;  Surgeon: [name], MD;  Location: [facility];  Service: Orthopedics;  Laterality: Right;    Prior to Admission medications   Medication Sig Start Date End Date Taking? Authorizing Provider  albuterol (PROVENTIL HFA;VENTOLIN HFA) 108 (90 Base) MCG/ACT inhaler Inhale 2 puffs into the lungs every 6 (six) hours as needed (for wheezing/shortness of breath).    Yes [provider]  ALPRAZolam Duanne Moron) 1 MG tablet Take 1 mg by mouth at bedtime.   Yes [provider]  esomeprazole (NEXIUM) 40 MG capsule Take 40 mg by mouth daily as needed (heartburn). For heartburn   Yes [provider]  levothyroxine (LEVOTHROID) 50 MCG tablet Take 50 mcg by mouth daily before breakfast.   Yes [provider]  Na Sulfate-K Sulfate-Mg Sulf (SUPREP BOWEL PREP KIT) 17.5-3.13-1.6 GM/177ML SOLN Take 1 kit by mouth as directed. 05/18/17  Yes Rourk,  Cristopher Estimable, MD  Omega-3 Fatty Acids (FISH OIL) 500 MG CAPS Take 500 mg by mouth daily.   Yes [provider]  OVER THE COUNTER MEDICATION Apply 1 application topically 3 (three) times daily as needed (for knee pain.). TWO OLD GOATS ESSENTIAL LOTION FOR ACHES & PAINS   Yes [provider]  oxyCODONE-acetaminophen (ENDOCET) 10-325 MG tablet Take 1 tablet by mouth every 4 (four) hours as needed for pain.   Yes [provider]  Polyethyl Glycol-Propyl Glycol (LUBRICANT EYE DROPS) 0.4-0.3 % SOLN Place 1 drop into both eyes 3 (three) times daily as needed (for dry eyes).   Yes [provider]  potassium  chloride SA (K-DUR,KLOR-CON) 20 MEQ tablet Take 20 mEq by mouth daily.   Yes [provider]  Pyridoxine HCl (VITAMIN B-6 PO) Take 1 tablet by mouth daily.   Yes [provider]  simvastatin (ZOCOR) 10 MG tablet Take 10 mg by mouth at bedtime.    Yes [provider]  topiramate (TOPAMAX) 100 MG tablet Take 100 mg by mouth 2 (two) times daily.   Yes [provider]  torsemide (DEMADEX) 20 MG tablet Take 40 mg by mouth daily.   Yes [provider]  Vitamin D, Ergocalciferol, (DRISDOL) 50000 UNITS CAPS Take 50,000 Units by mouth 2 (two) times a week. On Tuesdays and Thursdays   Yes [provider]    Allergies as of 05/18/2017 - Review Complete 05/18/2017  Allergen Reaction Noted  . Sulfa antibiotics Swelling 08/12/2011  . Penicillins Hives, Itching, and Rash 08/12/2011    Family History  Problem Relation Age of Onset  . Cancer Other   . Diabetes Other   . Heart disease Other   . Hypertension Other   . Asthma Other   . Alcoholism Other   . Thyroid disease Other   . Rheumatologic disease Other   . Stroke Other   . Hypercholesterolemia Other   . Colon cancer Neg Hx     Social History   Socioeconomic History  . Marital status: Married    Spouse name: Not on file  . Number of children: Not on file  . Years of education: Not on file  . Highest education level: Not on file  Occupational History  . Not on file  Social Needs  . Financial resource strain: Not on file  . Food insecurity:    Worry: Not on file    Inability: Not on file  . Transportation needs:    Medical: Not on file    Non-medical: Not on file  Tobacco Use  . Smoking status: Former Smoker    Packs/day: 0.50    Years: 10.00    Pack years: 5.00    Types: Cigarettes    Last attempt to quit: 04/21/1987    Years since quitting: 30.1  . Smokeless tobacco: Never Used  . Tobacco comment: smokes cigarette if she has a headache which helps.  Substance and Sexual  Activity  . Alcohol use: Not Currently    Comment: previously 2 glasses of wine a day  . Drug use: No  . Sexual activity: Never    Birth control/protection: Post-menopausal, Surgical  Lifestyle  . Physical activity:    Days per week: Not on file    Minutes per session: Not on file  . Stress: Not on file  Relationships  . Social connections:    Talks on phone: Not on file    Gets together: Not on file  Attends religious service: Not on file    Active member of club or organization: Not on file    Attends meetings of clubs or organizations: Not on file    Relationship status: Not on file  . Intimate partner violence:    Fear of current or ex partner: Not on file    Emotionally abused: Not on file    Physically abused: Not on file    Forced sexual activity: Not on file  Other Topics Concern  . Not on file  Social History Narrative   12th grade education. Lives with husband.     Review of Systems: See HPI, otherwise negative ROS  Physical Exam: Temp 98.4 F (36.9 C) (Oral)   Resp 18   Wt 219 lb 3.2 oz (99.4 kg)   SpO2 96%   BMI 41.42 kg/m  General:   Alert,  Well-developed, well-nourished, pleasant and cooperative in NAD Neck:  Supple; no masses or thyromegaly. No significant cervical adenopathy. Lungs:  Clear throughout to auscultation.   No wheezes, crackles, or rhonchi. No acute distress. Heart:  Regular rate and rhythm; no murmurs, clicks, rubs,  or gallops. Abdomen: Non-distended, normal bowel sounds.  Soft and nontender without appreciable mass or hepatosplenomegaly.  Pulses:  Normal pulses noted. Extremities:  Without clubbing or edema.  Impression:   Pleasant 72 year old lady with a history of multiple colonic adenomas removed 3 years ago; here for surveillance colonoscopy   Recommendations:  I have offered the patient a surveillance colonoscopy today.The risks, benefits, limitations, alternatives and imponderables have been reviewed with the patient.  Questions have been answered. All parties are agreeable.      Notice: This dictation was prepared with Dragon dictation along with smaller phrase technology. Any transcriptional errors that result from this process are unintentional and may not be corrected upon review.

## 2017-06-24 NOTE — Anesthesia Preprocedure Evaluation (Signed)
Anesthesia Evaluation  Patient identified by MRN, date of birth, ID band Patient awake    Reviewed: Allergy & Precautions, NPO status , Patient's Chart, lab work & pertinent test results  Airway Mallampati: II  TM Distance: >3 FB Neck ROM: Full    Dental no notable dental hx. (+) Edentulous Upper   Pulmonary neg pulmonary ROS, shortness of breath and with exertion, sleep apnea, Continuous Positive Airway Pressure Ventilation and Oxygen sleep apnea , COPD,  COPD inhaler and oxygen dependent, former smoker,  Uses 3l/min O2 qhs    Pulmonary exam normal breath sounds clear to auscultation       Cardiovascular Exercise Tolerance: Poor hypertension, Pt. on medications + Past MI, + Peripheral Vascular Disease and +CHF  negative cardio ROS Normal cardiovascular examII Rhythm:Regular Rate:Normal  States MI 2011  States no recent CHF issues    Neuro/Psych  Headaches, PSYCHIATRIC DISORDERS Anxiety Depression L eye Sx CVA, Residual Symptoms negative neurological ROS  negative psych ROS   GI/Hepatic negative GI ROS, Neg liver ROS, GERD  Medicated and Controlled,  Endo/Other  negative endocrine ROSHypothyroidism   Renal/GU Renal InsufficiencyRenal diseasenegative Renal ROS  negative genitourinary   Musculoskeletal negative musculoskeletal ROS (+) Arthritis , Osteoarthritis,    Abdominal   Peds negative pediatric ROS (+)  Hematology negative hematology ROS (+)   Anesthesia Other Findings   Reproductive/Obstetrics negative OB ROS                             Anesthesia Physical Anesthesia Plan  ASA: III  Anesthesia Plan: General   Post-op Pain Management:    Induction: Intravenous  PONV Risk Score and Plan:   Airway Management Planned: Simple Face Mask and Nasal Cannula  Additional Equipment:   Intra-op Plan:   Post-operative Plan: Extubation in OR  Informed Consent: I have reviewed the  patients History and Physical, chart, labs and discussed the procedure including the risks, benefits and alternatives for the proposed anesthesia with the patient or authorized representative who has indicated his/her understanding and acceptance.     Plan Discussed with: CRNA  Anesthesia Plan Comments:         Anesthesia Quick Evaluation

## 2017-06-24 NOTE — Transfer of Care (Signed)
Immediate Anesthesia Transfer of Care Note  Patient: Holly Flores  Procedure(s) Performed: COLONOSCOPY WITH PROPOFOL (N/A ) POLYPECTOMY  Patient Location: PACU  Anesthesia Type:General  Level of Consciousness: awake, alert , oriented and patient cooperative  Airway & Oxygen Therapy: Patient Spontanous Breathing  Post-op Assessment: Report given to RN and Post -op Vital signs reviewed and stable  Post vital signs: Reviewed and stable  Last Vitals:  Vitals Value Taken Time  BP 136/91 06/24/2017  4:13 PM  Temp    Pulse 73 06/24/2017  4:14 PM  Resp 16 06/24/2017  4:14 PM  SpO2 96 % 06/24/2017  4:14 PM  Vitals shown include unvalidated device data.  Last Pain:  Vitals:   06/24/17 1250  TempSrc: Oral  PainSc: 0-No pain      Patients Stated Pain Goal: 7 (30/09/23 3007)  Complications: No apparent anesthesia complications

## 2017-06-24 NOTE — Discharge Instructions (Signed)
Colon Polyps °Polyps are tissue growths inside the body. Polyps can grow in many places, including the large intestine (colon). A polyp may be a round bump or a mushroom-shaped growth. You could have one polyp or several. °Most colon polyps are noncancerous (benign). However, some colon polyps can become cancerous over time. °What are the causes? °The exact cause of colon polyps is not known. °What increases the risk? °This condition is more likely to develop in people who: °· Have a family history of colon cancer or colon polyps. °· Are older than 50 or older than 45 if they are African American. °· Have inflammatory bowel disease, such as ulcerative colitis or Crohn disease. °· Are overweight. °· Smoke cigarettes. °· Do not get enough exercise. °· Drink too much alcohol. °· Eat a diet that is: °? High in fat and red meat. °? Low in fiber. °· Had childhood cancer that was treated with abdominal radiation. ° °What are the signs or symptoms? °Most polyps do not cause symptoms. If you have symptoms, they may include: °· Blood coming from your rectum when having a bowel movement. °· Blood in your stool. The stool may look dark red or black. °· A change in bowel habits, such as constipation or diarrhea. ° °How is this diagnosed? °This condition is diagnosed with a colonoscopy. This is a procedure that uses a lighted, flexible scope to look at the inside of your colon. °How is this treated? °Treatment for this condition involves removing any polyps that are found. Those polyps will then be tested for cancer. If cancer is found, your health care provider will talk to you about options for colon cancer treatment. °Follow these instructions at home: °Diet °· Eat plenty of fiber, such as fruits, vegetables, and whole grains. °· Eat foods that are high in calcium and vitamin D, such as milk, cheese, yogurt, eggs, liver, fish, and broccoli. °· Limit foods high in fat, red meats, and processed meats, such as hot dogs, sausage,  bacon, and lunch meats. °· Maintain a healthy weight, or lose weight if recommended by your health care provider. °General instructions °· Do not smoke cigarettes. °· Do not drink alcohol excessively. °· Keep all follow-up visits as told by your health care provider. This is important. This includes keeping regularly scheduled colonoscopies. Talk to your health care provider about when you need a colonoscopy. °· Exercise every day or as told by your health care provider. °Contact a health care provider if: °· You have new or worsening bleeding during a bowel movement. °· You have new or increased blood in your stool. °· You have a change in bowel habits. °· You unexpectedly lose weight. °This information is not intended to replace advice given to you by your health care provider. Make sure you discuss any questions you have with your health care provider. °Document Released: 10/30/2003 Document Revised: 07/11/2015 Document Reviewed: 12/24/2014 °Elsevier Interactive Patient Education © 2018 [employer] ° °Colonoscopy °Discharge Instructions ° °Read the instructions outlined below and refer to this sheet in the next few weeks. These discharge instructions provide you with general information on caring for yourself after you leave the hospital. Your doctor may also give you specific instructions. While your treatment has been planned according to the most current medical practices available, unavoidable complications occasionally occur. If you have any problems or questions after discharge, call Dr. Rourk at [phone]. °ACTIVITY °· You may resume your regular activity, but move at a slower pace for the next 24   hours.  °· Take frequent rest periods for the next 24 hours.  °· Walking will help get rid of the air and reduce the bloated feeling in your belly (abdomen).  °· No driving for 24 hours (because of the medicine (anesthesia) used during the test).   °· Do not sign any important legal documents or operate any  machinery for 24 hours (because of the anesthesia used during the test).  °NUTRITION °· Drink plenty of fluids.  °· You may resume your normal diet as instructed by your doctor.  °· Begin with a light meal and progress to your normal diet. Heavy or fried foods are harder to digest and may make you feel sick to your stomach (nauseated).  °· Avoid alcoholic beverages for 24 hours or as instructed.  °MEDICATIONS °· You may resume your normal medications unless your doctor tells you otherwise.  °WHAT YOU CAN EXPECT TODAY °· Some feelings of bloating in the abdomen.  °· Passage of more gas than usual.  °· Spotting of blood in your stool or on the toilet paper.  °IF YOU HAD POLYPS REMOVED DURING THE COLONOSCOPY: °· No aspirin products for 7 days or as instructed.  °· No alcohol for 7 days or as instructed.  °· Eat a soft diet for the next 24 hours.  °FINDING OUT THE RESULTS OF YOUR TEST °Not all test results are available during your visit. If your test results are not back during the visit, make an appointment with your caregiver to find out the results. Do not assume everything is normal if you have not heard from your caregiver or the medical facility. It is important for you to follow up on all of your test results.  °SEEK IMMEDIATE MEDICAL ATTENTION IF: °· You have more than a spotting of blood in your stool.  °· Your belly is swollen (abdominal distention).  °· You are nauseated or vomiting.  °· You have a temperature over 101.  °· You have abdominal pain or discomfort that is severe or gets worse throughout the day.  ° ° ° °Colon polyp information provided ° °Further recommendations to follow pending review of pathology report °

## 2017-06-24 NOTE — Anesthesia Postprocedure Evaluation (Signed)
Anesthesia Post Note  Patient: Holly Flores  Procedure(s) Performed: COLONOSCOPY WITH PROPOFOL (N/A ) POLYPECTOMY  Patient location during evaluation: PACU Anesthesia Type: General Level of consciousness: awake and alert, oriented and patient cooperative Pain management: pain level controlled Vital Signs Assessment: post-procedure vital signs reviewed and stable Respiratory status: spontaneous breathing and respiratory function stable Cardiovascular status: blood pressure returned to baseline Postop Assessment: no apparent nausea or vomiting Anesthetic complications: no     Last Vitals:  Vitals:   06/24/17 1250 06/24/17 1613  BP:  (!) 136/91  Pulse:  79  Resp: 18 14  Temp: 36.9 C   SpO2: 96% 96%    Last Pain:  Vitals:   06/24/17 1613  TempSrc:   PainSc: 0-No pain                 [name] A

## 2017-06-25 DIAGNOSIS — F419 Anxiety disorder, unspecified: Secondary | ICD-10-CM | POA: Diagnosis not present

## 2017-06-25 DIAGNOSIS — Z1211 Encounter for screening for malignant neoplasm of colon: Secondary | ICD-10-CM | POA: Diagnosis not present

## 2017-06-25 DIAGNOSIS — Z86010 Personal history of colonic polyps: Secondary | ICD-10-CM | POA: Diagnosis not present

## 2017-06-25 DIAGNOSIS — D122 Benign neoplasm of ascending colon: Secondary | ICD-10-CM | POA: Diagnosis not present

## 2017-06-25 NOTE — Addendum Note (Signed)
Addendum  created 06/25/17 0825 by Ollen Bowl, CRNA   Charge Capture section accepted

## 2017-06-29 ENCOUNTER — Encounter (HOSPITAL_COMMUNITY): Payer: Self-pay | Admitting: Internal Medicine

## 2017-06-29 ENCOUNTER — Encounter: Payer: Self-pay | Admitting: Internal Medicine

## 2017-06-29 NOTE — Addendum Note (Signed)
Addendum  created 06/29/17 1257 by Mickel Baas, CRNA   Attestation recorded in [place], [place] filed

## 2017-06-29 NOTE — Op Note (Signed)
[facility] Patient Name: Holly Flores Procedure Date: 06/24/2017 3:30 PM MRN: [id number] Date of Birth: Jul 08, 1945 Attending MD: Norvel Richards , MD CSN: [id number] Age: 72 Admit Type: Outpatient Procedure:                Colonoscopy Indications:              High risk colon cancer surveillance: Personal                            history of colonic polyps Providers:                Norvel Richards, MD, Lurline Del, RN, Aram Candela Referring MD:              Medicines:                Propofol per Anesthesia Complications:            No immediate complications. Estimated Blood Loss:     Estimated blood loss was minimal. Procedure:                Pre-Anesthesia Assessment:                           - Prior to the procedure, a History and Physical                            was performed, and patient medications and                            allergies were reviewed. The patient's tolerance of                            previous anesthesia was also reviewed. The risks                            and benefits of the procedure and the sedation                            options and risks were discussed with the patient.                            All questions were answered, and informed consent                            was obtained. Prior Anticoagulants: The patient has                            taken no previous anticoagulant or antiplatelet                            agents. ASA Grade Assessment: II - A patient with  mild systemic disease. After reviewing the risks                            and benefits, the patient was deemed in                            satisfactory condition to undergo the procedure.                           After obtaining informed consent, the colonoscope                            was passed under direct vision. Throughout the                            procedure, the patient's blood  pressure, pulse, and                            oxygen saturations were monitored continuously. The                            EC38-[id number] ([id number]) scope was introduced through                            the and advanced to the the cecum, identified by                            appendiceal orifice and ileocecal valve. The                            colonoscopy was performed without difficulty. The                            patient tolerated the procedure well. The quality                            of the bowel preparation was adequate. The                            ileocecal valve, appendiceal orifice, and rectum                            were photographed. The entire colon was well                            visualized. Scope In: 3:50:13 PM Scope Out: 4:03:59 PM Scope Withdrawal Time: 0 hours 10 minutes 35 seconds  Total Procedure Duration: 0 hours 13 minutes 46 seconds  Findings:      The perianal and digital rectal examinations were normal.      A 5 mm polyp was found in the ascending colon. The polyp was sessile.       The polyp was removed with a cold snare. Resection and retrieval were       complete. Estimated blood loss was minimal.  The exam was otherwise without abnormality on direct and retroflexion       views. Impression:               - One 5 mm polyp in the ascending colon, removed                            with a cold snare. Resected and retrieved.                           - The examination was otherwise normal on direct                            and retroflexion views. Moderate Sedation:      Moderate (conscious) sedation was personally administered by an       anesthesia professional. The following parameters were monitored: oxygen       saturation, heart rate, blood pressure, respiratory rate, EKG, adequacy       of pulmonary ventilation, and response to care. Total physician       intraservice time was 23 minutes. Recommendation:           - Patient has  a contact number available for                            emergencies. The signs and symptoms of potential                            delayed complications were discussed with the                            patient. Return to normal activities tomorrow.                            Written discharge instructions were provided to the                            patient.                           - Resume previous diet.                           - Continue present medications.                           - Repeat colonoscopy date to be determined after                            pending pathology results are reviewed for                            surveillance based on pathology results.                           - Return to GI office (date not yet determined). Procedure Code(s):        --- Professional ---  45385, Colonoscopy, flexible; with removal of                            tumor(s), polyp(s), or other lesion(s) by snare                            technique Diagnosis Code(s):        --- Professional ---                           Z86.010, Personal history of colonic polyps                           D12.2, Benign neoplasm of ascending colon CPT copyright 2017 American Medical Association. All rights reserved. The codes documented in this report are preliminary and upon coder review may  be revised to meet current compliance requirements. Cristopher Estimable. Caston Coopersmith, MD Norvel Richards, MD 06/29/2017 12:03:57 PM This report has been signed electronically. Number of Addenda: 0

## 2017-07-08 ENCOUNTER — Ambulatory Visit (INDEPENDENT_AMBULATORY_CARE_PROVIDER_SITE_OTHER): Payer: Medicare Other

## 2017-07-08 ENCOUNTER — Other Ambulatory Visit: Payer: Self-pay

## 2017-07-08 DIAGNOSIS — R079 Chest pain, unspecified: Secondary | ICD-10-CM

## 2017-07-16 ENCOUNTER — Telehealth: Payer: Self-pay | Admitting: *Deleted

## 2017-07-16 NOTE — Telephone Encounter (Signed)
Pt aware and voiced understanding - will message provider on when pt needs to f/u - routed to pcp

## 2017-07-16 NOTE — Telephone Encounter (Signed)
-----   Message from Charlie Pitter, PA-C sent at 07/09/2017 11:57 AM EDT ----- Covering Dr. Nelly Laurence box. Please let patient know echo overall reassuring and very similar to prior. Normal heart function. The echocardiogram suggested there is some stiffening of the heart muscle, which is what we call diastolic dysfunction. In addition to good blood pressure control and achieving or maintaining a healthy weight, would advise to generally stick to lower sodium diet (aiming for maximum 2,000mg  per day) and staying hydrated but not to excess. In general about 64oz per day of all fluid intake would be an ideal maximum.  Will leave in his box for his review to decide on possible event monitor per his recent office note - he was awaiting result to make this decision. Dayna Dunn PA-C

## 2017-07-20 ENCOUNTER — Telehealth: Payer: Self-pay | Admitting: *Deleted

## 2017-07-20 DIAGNOSIS — R55 Syncope and collapse: Secondary | ICD-10-CM

## 2017-07-20 NOTE — Telephone Encounter (Signed)
-----   Message from Arnoldo Lenis, MD sent at 07/20/2017  2:13 PM EDT ----- Can we arrange a 30 day event monitor for syncope and f/u 6 weeks  Zandra Abts MD ----- Message ----- From: Massie Maroon, CMA Sent: 07/16/2017   1:36 PM To: Arnoldo Lenis, MD  When did you want to f/u with this pt?  Lu Paradise

## 2017-07-20 NOTE — Telephone Encounter (Signed)
Pt agreeable to event monitor - will place orders and enroll. 6 weeks f/u scheduled

## 2017-08-30 ENCOUNTER — Ambulatory Visit (INDEPENDENT_AMBULATORY_CARE_PROVIDER_SITE_OTHER): Payer: Medicare Other

## 2017-08-30 DIAGNOSIS — R55 Syncope and collapse: Secondary | ICD-10-CM | POA: Diagnosis not present

## 2017-09-06 ENCOUNTER — Encounter: Payer: Self-pay | Admitting: Cardiology

## 2017-09-06 ENCOUNTER — Ambulatory Visit (INDEPENDENT_AMBULATORY_CARE_PROVIDER_SITE_OTHER): Payer: Medicare Other | Admitting: Cardiology

## 2017-09-06 VITALS — BP 157/88 | HR 73 | Ht 61.0 in | Wt 218.2 lb

## 2017-09-06 DIAGNOSIS — R55 Syncope and collapse: Secondary | ICD-10-CM | POA: Diagnosis not present

## 2017-09-06 DIAGNOSIS — I4892 Unspecified atrial flutter: Secondary | ICD-10-CM

## 2017-09-06 MED ORDER — APIXABAN 5 MG PO TABS: 5.0000 mg | ORAL_TABLET | Freq: Two times a day (BID) | ORAL | 0 refills | Status: DC

## 2017-09-06 MED ORDER — APIXABAN 5 MG PO TABS: 5.0000 mg | ORAL_TABLET | Freq: Two times a day (BID) | ORAL | 3 refills | Status: DC

## 2017-09-06 NOTE — Progress Notes (Signed)
Clinical Summary Holly Flores is a 72 y.o.female seen today for follow up, this is a focused visit to discuss recent heart monitor results and prior syncope.    1. Syncope - multiple recent episodes over the last 6 months.  - no specific pattern or trigger - can have pain in left arm, radiates into neck. Blacks out. - mainly occurs with activity.  - occasional palpitations, though rare.    06/2017 echo LVEF 60-65%, no WMAs, grade I diastolic dysfunction - 05/1738 monitor showed episode of aflutter. No arrhythmias that would explain syncope.     Past Medical History:  Diagnosis Date  . Anxiety   . Arthritis   . CHF (congestive heart failure) ([place])   . COPD (chronic obstructive pulmonary disease) ([place])   . Depression   . GERD (gastroesophageal reflux disease)   . Goiter   . Hyperlipidemia   . Hypertension   . Hypothyroidism   . Kidney cysts    left side  . Myocardial infarction ([place])    8 yrs ago.   Marland Kitchen Shortness of breath   . Sleep apnea    uses CPAP, 3  . Stroke ([facility])   . 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     Allergies  Allergen Reactions  . Penicillins Hives, Itching and Rash    Has patient had a PCN reaction causing immediate rash, facial/tongue/throat swelling, SOB or lightheadedness with hypotension: Yes Has patient had a PCN reaction causing severe rash involving mucus membranes or skin necrosis: Yes Has patient had a PCN reaction that required hospitalization: No Has patient had a PCN reaction occurring within the last 10 years: No If all of the above answers are "NO", then may proceed with Cephalosporin use.   . Sulfa Antibiotics Swelling    Tongue swelling     Current Outpatient Medications  Medication Sig Dispense Refill  . albuterol (PROVENTIL HFA;VENTOLIN HFA) 108 (90 Base) MCG/ACT inhaler Inhale 2 puffs into the lungs every 6 (six) hours as needed (for wheezing/shortness of breath).     . ALPRAZolam (XANAX) 1 MG tablet Take 1 mg  by mouth at bedtime.    Marland Kitchen esomeprazole (NEXIUM) 40 MG capsule Take 40 mg by mouth daily as needed (heartburn). For heartburn    . levothyroxine (LEVOTHROID) 50 MCG tablet Take 50 mcg by mouth daily before breakfast.    . Na Sulfate-K Sulfate-Mg Sulf (SUPREP BOWEL PREP KIT) 17.5-3.13-1.6 GM/177ML SOLN Take 1 kit by mouth as directed. 1 Bottle 0  . Omega-3 Fatty Acids (FISH OIL) 500 MG CAPS Take 500 mg by mouth daily.    Marland Kitchen OVER THE COUNTER MEDICATION Apply 1 application topically 3 (three) times daily as needed (for knee pain.). TWO [place] [place] ESSENTIAL LOTION FOR ACHES & PAINS    . oxyCODONE-acetaminophen (ENDOCET) 10-325 MG tablet Take 1 tablet by mouth every 4 (four) hours as needed for pain.    Vladimir Faster Glycol-Propyl Glycol (LUBRICANT EYE DROPS) 0.4-0.3 % SOLN Place 1 drop into both eyes 3 (three) times daily as needed (for dry eyes).    . potassium chloride SA (K-DUR,KLOR-CON) 20 MEQ tablet Take 20 mEq by mouth daily.    . Pyridoxine HCl (VITAMIN B-6 PO) Take 1 tablet by mouth daily.    . simvastatin (ZOCOR) 10 MG tablet Take 10 mg by mouth at bedtime.     . topiramate (TOPAMAX) 100 MG tablet Take 100 mg by mouth 2 (two)  times daily.    Marland Kitchen torsemide (DEMADEX) 20 MG tablet Take 40 mg by mouth daily.    . Vitamin D, Ergocalciferol, (DRISDOL) 50000 UNITS CAPS Take 50,000 Units by mouth 2 (two) times a week. On Tuesdays and Thursdays     No current facility-administered medications for this visit.      Past Surgical History:  Procedure Laterality Date  . ABDOMINAL HYSTERECTOMY    . BACK SURGERY     x3,cervical and lunmbar, disc.  Marland Kitchen BREAST BIOPSY     left-non cancerous  . CARDIAC CATHETERIZATION    . CARPAL TUNNEL RELEASE Left   . CATARACT EXTRACTION W/PHACO  09/21/2011   Procedure: CATARACT EXTRACTION PHACO AND INTRAOCULAR LENS PLACEMENT (IOC);  Surgeon: Williams Che, MD;  Location: [facility];  Service: Ophthalmology;  Laterality: Left;  CDE:9.78  . CATARACT EXTRACTION W/PHACO Right  10/15/2014   Procedure: CATARACT EXTRACTION PHACO AND INTRAOCULAR LENS PLACEMENT ([place]);  Surgeon: Williams Che, MD;  Location: [facility];  Service: Ophthalmology;  Laterality: Right;  CDE:4.19  . CERVICAL SPINE SURGERY    . CESAREAN SECTION     x2  . CHOLECYSTECTOMY    . COLONOSCOPY  2006   RMR: 1. Internal hemorroids, otherwise normal rectum. 2. Pedunculated polyp at 35 cm. reomved with snare. The remainder of teh colonic mucosa appeared normal.   . COLONOSCOPY  2011   Dr. Gala Romney: multiple ascending colon polyps and rectal polyp, adenomatous  . COLONOSCOPY N/A 05/31/2014   Procedure: COLONOSCOPY;  Surgeon: Daneil Dolin, MD;  Location: [facility];  Service: Endoscopy;  Laterality: N/A;  130  . COLONOSCOPY WITH PROPOFOL N/A 06/24/2017   Procedure: COLONOSCOPY WITH PROPOFOL;  Surgeon: Daneil Dolin, MD;  Location: [facility];  Service: Endoscopy;  Laterality: N/A;  2:15pm  . ESOPHAGOGASTRODUODENOSCOPY  2006   Dr. Gala Romney: Subtle Schatzkis ring, otherwise normal upper GI tract, aside from a small pyloric channell erosion, status post dilation as described above.   Marland Kitchen KNEE SURGERY Bilateral   . POLYPECTOMY  06/24/2017   Procedure: POLYPECTOMY;  Surgeon: Daneil Dolin, MD;  Location[facility];  Service: Endoscopy;;  ascending  . TONSILLECTOMY    . TOTAL KNEE ARTHROPLASTY Right 05/24/2013   DR MURPHY  . TOTAL KNEE ARTHROPLASTY Right 05/24/2013   Procedure: TOTAL KNEE ARTHROPLASTY;  Surgeon: [name], MD;  Location: [facility];  Service: Orthopedics;  Laterality: Right;     Allergies  Allergen Reactions  . Penicillins Hives, Itching and Rash    Has patient had a PCN reaction causing immediate rash, facial/tongue/throat swelling, SOB or lightheadedness with hypotension: Yes Has patient had a PCN reaction causing severe rash involving mucus membranes or skin necrosis: Yes Has patient had a PCN reaction that required hospitalization: No Has patient had a PCN reaction occurring within  the last 10 years: No If all of the above answers are "NO", then may proceed with Cephalosporin use.   . Sulfa Antibiotics Swelling    Tongue swelling      Family History  Problem Relation Age of Onset  . Cancer Other   . Diabetes Other   . Heart disease Other   . Hypertension Other   . Asthma Other   . Alcoholism Other   . Thyroid disease Other   . Rheumatologic disease Other   . Stroke Other   . Hypercholesterolemia Other   . Colon cancer Neg Hx      Social History Holly Flores reports that she  quit smoking about 30 years ago. Her smoking use included cigarettes. She has a 5.00 pack-year smoking history. She has never used smokeless tobacco. Holly Flores reports that she drank alcohol.   Review of Systems CONSTITUTIONAL: No weight loss, fever, chills, weakness or fatigue.  HEENT: Eyes: No visual loss, blurred vision, double vision or yellow sclerae.No hearing loss, sneezing, congestion, runny nose or sore throat.  SKIN: No rash or itching.  CARDIOVASCULAR: per hpi RESPIRATORY: No shortness of breath, cough or sputum.  GASTROINTESTINAL: No anorexia, nausea, vomiting or diarrhea. No abdominal pain or blood.  GENITOURINARY: No burning on urination, no polyuria NEUROLOGICAL: No headache, dizziness, syncope, paralysis, ataxia, numbness or tingling in the extremities. No change in bowel or bladder control.  MUSCULOSKELETAL: No muscle, back pain, joint pain or stiffness.  LYMPHATICS: No enlarged nodes. No history of splenectomy.  PSYCHIATRIC: No history of depression or anxiety.  ENDOCRINOLOGIC: No reports of sweating, cold or heat intolerance. No polyuria or polydipsia.  Marland Kitchen   Physical Examination Vitals:   09/06/17 1313  BP: (!) 157/88  Pulse: 73   Vitals:   09/06/17 1313  Weight: 218 lb 3.2 oz (99 kg)  Height: '5\' 1"'  (1.549 m)    Gen: resting comfortably, no acute distress HEENT: no scleral icterus, pupils equal round and reactive, no palptable cervical  adenopathy,  CV: RRR, no m/r/g, no jvd Resp: Clear to auscultation bilaterally GI: abdomen is soft, non-tender, non-distended, normal bowel sounds, no hepatosplenomegaly MSK: extremities are warm, no edema.  Skin: warm, no rash Neuro:  no focal deficits Psych: appropriate affect   Diagnostic Studies 10/2011 cath HEMODYNAMICS: Aortic systolic pressure: 242.  Aortic diastolic pressure: 78.  Left ventricular systolic pressure: 353.  Left ventricular diastolic pressure: 18.  CORONARY ANGIOGRAPHY:  1. Left main: The left main normal.  2. LAD: The LAD had a 61%-44% mid systolic bridge after a diagonal branch.  3. Left circumflex coronary artery: Free of significant disease.  4. Right coronary artery: This a large dominant vessel which was free of  significant disease.  LEFT VENTRICULOGRAPHY: The RAO left ventriculogram was performed using 25  cc of Omnipaque dye at 12 cc per second. The overall LVEF was estimated at  greater than 60% without focal wall motion abnormality.  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 I suspect that her chest  pain is related to cholelithiasis.  The sheath was removed and pressure held on the groin to achieve hemostasis.  The patient left the cath lab in stable condition.  DISPOSITION: She will be discharged home today to be followed as an  outpatient. She will see me back in the office in several weeks. She is  cleared to undergo an elective cholecystectomy, at a low cardiovascular  risk. She left the laboratory in stable condition. Dr. Octavio Graves was  notified of these results.   12/2014 echo ------------------------------------------------------------------- Study Conclusions  - Left ventricle: The cavity size was normal. Wall thickness was normal. Systolic function was vigorous. The  estimated ejection fraction was in the range of 65% to 70%. Doppler parameters are consistent with abnormal left ventricular relaxation (grade 1 diastolic dysfunction). - Left atrium: The atrium was mildly dilated.   06/2017 echo Study Conclusions  - Left ventricle: The cavity size was normal. Wall thickness was   increased in a pattern of mild LVH. Systolic function was normal.  The estimated ejection fraction was in the range of 60% to 65%.   Wall motion was normal; there were no regional wall motion   abnormalities. Doppler parameters are consistent with abnormal   left ventricular relaxation (grade 1 diastolic dysfunction). - Mitral valve: Mildly calcified annulus. There was trivial   regurgitation. - Right atrium: Central venous pressure (est): 3 mm Hg. - Atrial septum: No defect or patent foramen ovale was identified. - Tricuspid valve: There was trivial regurgitation. - Pulmonary arteries: PA peak pressure: 7 mm Hg (S). - Pericardium, extracardiac: There was no pericardial effusion.  Assessment and Plan  1.Syncope - uncleaer etiology, no etiology noted by recent echo or event monitor - no recurrence - no further workup at this time  2. Aflutter - new diagnosis based on cardiac monitor - CHADS2Vasc score is 5, we will start eliquis 103m bid and stop aspirin.        JArnoldo Lenis M.D.

## 2017-09-06 NOTE — Patient Instructions (Signed)
Your physician wants you to follow-up in: [place] will receive a reminder letter in the mail two months in advance. If you don't receive a letter, please call our office to schedule the follow-up appointment.  Your physician has recommended you make the following change in your medication:   STOP ASPIRIN   START ELIQUIS 5 MG TWICE DAILY   Thank you for choosing [facility]!!

## 2017-09-15 ENCOUNTER — Encounter: Payer: Self-pay | Admitting: Cardiology

## 2017-10-29 ENCOUNTER — Emergency Department (HOSPITAL_COMMUNITY): Payer: Medicare Other

## 2017-10-29 ENCOUNTER — Inpatient Hospital Stay (HOSPITAL_COMMUNITY)
Admission: EM | Admit: 2017-10-29 | Discharge: 2017-11-01 | DRG: 065 | Disposition: A | Discharge status: Rehabilitation Facility | Payer: Medicare Other | Attending: Family Medicine | Admitting: Family Medicine

## 2017-10-29 ENCOUNTER — Other Ambulatory Visit: Payer: Self-pay

## 2017-10-29 ENCOUNTER — Encounter (HOSPITAL_COMMUNITY): Payer: Self-pay

## 2017-10-29 ENCOUNTER — Inpatient Hospital Stay (HOSPITAL_COMMUNITY): Payer: Medicare Other

## 2017-10-29 DIAGNOSIS — I5030 Unspecified diastolic (congestive) heart failure: Secondary | ICD-10-CM | POA: Diagnosis not present

## 2017-10-29 DIAGNOSIS — Z8673 Personal history of transient ischemic attack (TIA), and cerebral infarction without residual deficits: Secondary | ICD-10-CM

## 2017-10-29 DIAGNOSIS — N189 Chronic kidney disease, unspecified: Secondary | ICD-10-CM | POA: Diagnosis not present

## 2017-10-29 DIAGNOSIS — R29703 NIHSS score 3: Secondary | ICD-10-CM | POA: Diagnosis present

## 2017-10-29 DIAGNOSIS — I2510 Atherosclerotic heart disease of native coronary artery without angina pectoris: Secondary | ICD-10-CM | POA: Diagnosis present

## 2017-10-29 DIAGNOSIS — Z87891 Personal history of nicotine dependence: Secondary | ICD-10-CM | POA: Diagnosis not present

## 2017-10-29 DIAGNOSIS — G8191 Hemiplegia, unspecified affecting right dominant side: Secondary | ICD-10-CM | POA: Diagnosis present

## 2017-10-29 DIAGNOSIS — F329 Major depressive disorder, single episode, unspecified: Secondary | ICD-10-CM | POA: Diagnosis not present

## 2017-10-29 DIAGNOSIS — N179 Acute kidney failure, unspecified: Secondary | ICD-10-CM | POA: Diagnosis not present

## 2017-10-29 DIAGNOSIS — Z23 Encounter for immunization: Secondary | ICD-10-CM | POA: Diagnosis present

## 2017-10-29 DIAGNOSIS — I6300 Cerebral infarction due to thrombosis of unspecified precerebral artery: Secondary | ICD-10-CM | POA: Diagnosis not present

## 2017-10-29 DIAGNOSIS — R471 Dysarthria and anarthria: Secondary | ICD-10-CM | POA: Diagnosis present

## 2017-10-29 DIAGNOSIS — N182 Chronic kidney disease, stage 2 (mild): Secondary | ICD-10-CM | POA: Diagnosis present

## 2017-10-29 DIAGNOSIS — J449 Chronic obstructive pulmonary disease, unspecified: Secondary | ICD-10-CM | POA: Diagnosis present

## 2017-10-29 DIAGNOSIS — K567 Ileus, unspecified: Secondary | ICD-10-CM | POA: Diagnosis not present

## 2017-10-29 DIAGNOSIS — I110 Hypertensive heart disease with heart failure: Secondary | ICD-10-CM | POA: Diagnosis present

## 2017-10-29 DIAGNOSIS — I69321 Dysphasia following cerebral infarction: Secondary | ICD-10-CM

## 2017-10-29 DIAGNOSIS — E669 Obesity, unspecified: Secondary | ICD-10-CM | POA: Diagnosis present

## 2017-10-29 DIAGNOSIS — G8101 Flaccid hemiplegia affecting right dominant side: Secondary | ICD-10-CM

## 2017-10-29 DIAGNOSIS — I6340 Cerebral infarction due to embolism of unspecified cerebral artery: Secondary | ICD-10-CM | POA: Diagnosis not present

## 2017-10-29 DIAGNOSIS — R0989 Other specified symptoms and signs involving the circulatory and respiratory systems: Secondary | ICD-10-CM | POA: Diagnosis not present

## 2017-10-29 DIAGNOSIS — I10 Essential (primary) hypertension: Secondary | ICD-10-CM | POA: Diagnosis not present

## 2017-10-29 DIAGNOSIS — I480 Paroxysmal atrial fibrillation: Secondary | ICD-10-CM | POA: Diagnosis present

## 2017-10-29 DIAGNOSIS — H5462 Unqualified visual loss, left eye, normal vision right eye: Secondary | ICD-10-CM | POA: Diagnosis present

## 2017-10-29 DIAGNOSIS — I252 Old myocardial infarction: Secondary | ICD-10-CM | POA: Diagnosis not present

## 2017-10-29 DIAGNOSIS — I671 Cerebral aneurysm, nonruptured: Secondary | ICD-10-CM | POA: Diagnosis present

## 2017-10-29 DIAGNOSIS — R197 Diarrhea, unspecified: Secondary | ICD-10-CM | POA: Diagnosis not present

## 2017-10-29 DIAGNOSIS — K219 Gastro-esophageal reflux disease without esophagitis: Secondary | ICD-10-CM | POA: Diagnosis present

## 2017-10-29 DIAGNOSIS — R140 Abdominal distension (gaseous): Secondary | ICD-10-CM | POA: Diagnosis not present

## 2017-10-29 DIAGNOSIS — I63512 Cerebral infarction due to unspecified occlusion or stenosis of left middle cerebral artery: Secondary | ICD-10-CM | POA: Diagnosis not present

## 2017-10-29 DIAGNOSIS — E039 Hypothyroidism, unspecified: Secondary | ICD-10-CM | POA: Diagnosis present

## 2017-10-29 DIAGNOSIS — Z79899 Other long term (current) drug therapy: Secondary | ICD-10-CM | POA: Diagnosis not present

## 2017-10-29 DIAGNOSIS — E46 Unspecified protein-calorie malnutrition: Secondary | ICD-10-CM | POA: Diagnosis not present

## 2017-10-29 DIAGNOSIS — G4733 Obstructive sleep apnea (adult) (pediatric): Secondary | ICD-10-CM | POA: Diagnosis present

## 2017-10-29 DIAGNOSIS — K5901 Slow transit constipation: Secondary | ICD-10-CM

## 2017-10-29 DIAGNOSIS — E876 Hypokalemia: Secondary | ICD-10-CM | POA: Diagnosis not present

## 2017-10-29 DIAGNOSIS — G4730 Sleep apnea, unspecified: Secondary | ICD-10-CM | POA: Diagnosis present

## 2017-10-29 DIAGNOSIS — I639 Cerebral infarction, unspecified: Secondary | ICD-10-CM | POA: Diagnosis present

## 2017-10-29 DIAGNOSIS — N183 Chronic kidney disease, stage 3 (moderate): Secondary | ICD-10-CM | POA: Diagnosis not present

## 2017-10-29 DIAGNOSIS — I5032 Chronic diastolic (congestive) heart failure: Secondary | ICD-10-CM | POA: Diagnosis present

## 2017-10-29 DIAGNOSIS — Z7901 Long term (current) use of anticoagulants: Secondary | ICD-10-CM

## 2017-10-29 DIAGNOSIS — H348122 Central retinal vein occlusion, left eye, stable: Secondary | ICD-10-CM | POA: Diagnosis present

## 2017-10-29 DIAGNOSIS — I69351 Hemiplegia and hemiparesis following cerebral infarction affecting right dominant side: Secondary | ICD-10-CM | POA: Diagnosis not present

## 2017-10-29 DIAGNOSIS — I130 Hypertensive heart and chronic kidney disease with heart failure and stage 1 through stage 4 chronic kidney disease, or unspecified chronic kidney disease: Secondary | ICD-10-CM | POA: Diagnosis present

## 2017-10-29 DIAGNOSIS — Z880 Allergy status to penicillin: Secondary | ICD-10-CM | POA: Diagnosis not present

## 2017-10-29 DIAGNOSIS — E785 Hyperlipidemia, unspecified: Secondary | ICD-10-CM | POA: Diagnosis present

## 2017-10-29 DIAGNOSIS — Z96651 Presence of right artificial knee joint: Secondary | ICD-10-CM | POA: Diagnosis present

## 2017-10-29 DIAGNOSIS — H532 Diplopia: Secondary | ICD-10-CM | POA: Diagnosis present

## 2017-10-29 DIAGNOSIS — I69398 Other sequelae of cerebral infarction: Secondary | ICD-10-CM | POA: Diagnosis not present

## 2017-10-29 DIAGNOSIS — K5900 Constipation, unspecified: Secondary | ICD-10-CM | POA: Diagnosis present

## 2017-10-29 DIAGNOSIS — F419 Anxiety disorder, unspecified: Secondary | ICD-10-CM | POA: Diagnosis present

## 2017-10-29 DIAGNOSIS — G43919 Migraine, unspecified, intractable, without status migrainosus: Secondary | ICD-10-CM | POA: Diagnosis not present

## 2017-10-29 DIAGNOSIS — D62 Acute posthemorrhagic anemia: Secondary | ICD-10-CM | POA: Diagnosis not present

## 2017-10-29 DIAGNOSIS — I63429 Cerebral infarction due to embolism of unspecified anterior cerebral artery: Secondary | ICD-10-CM | POA: Diagnosis not present

## 2017-10-29 DIAGNOSIS — K598 Other specified functional intestinal disorders: Secondary | ICD-10-CM | POA: Diagnosis not present

## 2017-10-29 DIAGNOSIS — I69322 Dysarthria following cerebral infarction: Secondary | ICD-10-CM | POA: Diagnosis present

## 2017-10-29 LAB — CBC
HCT: 40 % (ref 36.0–46.0)
Hemoglobin: 13.2 g/dL (ref 12.0–15.0)
MCH: 30.6 pg (ref 26.0–34.0)
MCHC: 33 g/dL (ref 30.0–36.0)
MCV: 92.6 fL (ref 78.0–100.0)
PLATELETS: 245 10*3/uL (ref 150–400)
RBC: 4.32 MIL/uL (ref 3.87–5.11)
RDW: 13.8 % (ref 11.5–15.5)
WBC: 11.3 10*3/uL — ABNORMAL HIGH (ref 4.0–10.5)

## 2017-10-29 LAB — DIFFERENTIAL
Basophils Absolute: 0.1 10*3/uL (ref 0.0–0.1)
Basophils Relative: 0 %
EOS ABS: 0.5 10*3/uL (ref 0.0–0.7)
EOS PCT: 5 %
LYMPHS PCT: 47 %
Lymphs Abs: 5.4 10*3/uL — ABNORMAL HIGH (ref 0.7–4.0)
Monocytes Absolute: 0.8 10*3/uL (ref 0.1–1.0)
Monocytes Relative: 7 %
NEUTROS PCT: 41 %
Neutro Abs: 4.6 10*3/uL (ref 1.7–7.7)

## 2017-10-29 LAB — APTT: aPTT: 34 seconds (ref 24–36)

## 2017-10-29 LAB — COMPREHENSIVE METABOLIC PANEL
ALBUMIN: 3.6 g/dL (ref 3.5–5.0)
ALT: 8 U/L (ref 0–44)
ANION GAP: 8 (ref 5–15)
AST: 13 U/L — ABNORMAL LOW (ref 15–41)
Alkaline Phosphatase: 77 U/L (ref 38–126)
BUN: 15 mg/dL (ref 8–23)
CO2: 25 mmol/L (ref 22–32)
Calcium: 8.7 mg/dL — ABNORMAL LOW (ref 8.9–10.3)
Chloride: 110 mmol/L (ref 98–111)
Creatinine, Ser: 1.28 mg/dL — ABNORMAL HIGH (ref 0.44–1.00)
GFR calc Af Amer: 48 mL/min — ABNORMAL LOW (ref >60)
GFR calc non Af Amer: 41 mL/min — ABNORMAL LOW (ref >60)
GLUCOSE: 115 mg/dL — AB (ref 70–99)
Potassium: 3.4 mmol/L — ABNORMAL LOW (ref 3.5–5.1)
SODIUM: 143 mmol/L (ref 135–145)
Total Bilirubin: 0.5 mg/dL (ref 0.3–1.2)
Total Protein: 7.3 g/dL (ref 6.5–8.1)

## 2017-10-29 LAB — TROPONIN I: Troponin I: <0.03 ng/mL (ref <0.03)

## 2017-10-29 LAB — GLUCOSE, CAPILLARY: Glucose-Capillary: 98 mg/dL (ref 70–99)

## 2017-10-29 LAB — PROTIME-INR
INR: 1.23
PROTHROMBIN TIME: 15.4 s — AB (ref 11.4–15.2)

## 2017-10-29 LAB — ETHANOL

## 2017-10-29 MED ORDER — TORSEMIDE 20 MG PO TABS
20.0000 mg | ORAL_TABLET | Freq: Every day | ORAL | Status: DC
  Administered 2017-10-30 – 2017-11-01 (×3): 20 mg via ORAL
  Filled 2017-10-29 (×3): qty 1

## 2017-10-29 MED ORDER — TRAZODONE HCL 50 MG PO TABS: 50.0000 mg | ORAL_TABLET | Freq: Every evening | ORAL | Status: DC | PRN

## 2017-10-29 MED ORDER — OXYCODONE-ACETAMINOPHEN 10-325 MG PO TABS: 1.0000 | ORAL_TABLET | ORAL | Status: DC | PRN

## 2017-10-29 MED ORDER — SODIUM CHLORIDE 0.9 % IV SOLN: 250.0000 mL | INTRAVENOUS | Status: DC | PRN

## 2017-10-29 MED ORDER — INFLUENZA VAC SPLIT HIGH-DOSE 0.5 ML IM SUSY
0.5000 mL | PREFILLED_SYRINGE | INTRAMUSCULAR | Status: AC
  Administered 2017-10-31: 0.5 mL via INTRAMUSCULAR
  Filled 2017-10-29 (×2): qty 0.5

## 2017-10-29 MED ORDER — ASPIRIN 325 MG PO TABS
325.0000 mg | ORAL_TABLET | Freq: Every day | ORAL | Status: DC
  Administered 2017-10-29 – 2017-11-01 (×4): 325 mg via ORAL
  Filled 2017-10-29 (×4): qty 1

## 2017-10-29 MED ORDER — LEVOTHYROXINE SODIUM 50 MCG PO TABS
50.0000 ug | ORAL_TABLET | Freq: Every day | ORAL | Status: DC
  Administered 2017-10-29 – 2017-11-01 (×4): 50 ug via ORAL
  Filled 2017-10-29 (×4): qty 1

## 2017-10-29 MED ORDER — HEPARIN SODIUM (PORCINE) 5000 UNIT/ML IJ SOLN
5000.0000 [IU] | Freq: Three times a day (TID) | INTRAMUSCULAR | Status: DC
  Administered 2017-10-29 – 2017-11-01 (×9): 5000 [IU] via SUBCUTANEOUS
  Filled 2017-10-29 (×9): qty 1

## 2017-10-29 MED ORDER — ALBUTEROL SULFATE (2.5 MG/3ML) 0.083% IN NEBU: 2.5000 mg | INHALATION_SOLUTION | RESPIRATORY_TRACT | Status: DC | PRN

## 2017-10-29 MED ORDER — HYDRALAZINE HCL 20 MG/ML IJ SOLN: 10.0000 mg | Freq: Four times a day (QID) | INTRAMUSCULAR | Status: DC | PRN

## 2017-10-29 MED ORDER — IOPAMIDOL (ISOVUE-370) INJECTION 76%
100.0000 mL | Freq: Once | INTRAVENOUS | Status: AC | PRN
  Administered 2017-10-29: 80 mL via INTRAVENOUS

## 2017-10-29 MED ORDER — ALBUTEROL SULFATE HFA 108 (90 BASE) MCG/ACT IN AERS: 2.0000 | INHALATION_SPRAY | Freq: Four times a day (QID) | RESPIRATORY_TRACT | Status: DC | PRN

## 2017-10-29 MED ORDER — ONDANSETRON HCL 4 MG/2ML IJ SOLN: 4.0000 mg | Freq: Four times a day (QID) | INTRAMUSCULAR | Status: DC | PRN

## 2017-10-29 MED ORDER — ALPRAZOLAM 1 MG PO TABS
1.0000 mg | ORAL_TABLET | Freq: Every day | ORAL | Status: DC
  Administered 2017-10-29 – 2017-10-31 (×3): 1 mg via ORAL
  Filled 2017-10-29 (×3): qty 1

## 2017-10-29 MED ORDER — APIXABAN 5 MG PO TABS: 5.0000 mg | ORAL_TABLET | Freq: Two times a day (BID) | ORAL | Status: DC

## 2017-10-29 MED ORDER — ACETAMINOPHEN 325 MG PO TABS
650.0000 mg | ORAL_TABLET | Freq: Four times a day (QID) | ORAL | Status: DC | PRN
  Administered 2017-10-31 (×2): 650 mg via ORAL
  Filled 2017-10-29 (×2): qty 2

## 2017-10-29 MED ORDER — OXYCODONE-ACETAMINOPHEN 5-325 MG PO TABS: 1.0000 | ORAL_TABLET | ORAL | Status: DC | PRN

## 2017-10-29 MED ORDER — PANTOPRAZOLE SODIUM 40 MG PO TBEC
40.0000 mg | DELAYED_RELEASE_TABLET | Freq: Every day | ORAL | Status: DC
  Administered 2017-10-29 – 2017-11-01 (×4): 40 mg via ORAL
  Filled 2017-10-29 (×4): qty 1

## 2017-10-29 MED ORDER — TORSEMIDE 20 MG PO TABS: 40.0000 mg | ORAL_TABLET | Freq: Every day | ORAL | Status: DC

## 2017-10-29 MED ORDER — ACETAMINOPHEN 650 MG RE SUPP: 650.0000 mg | Freq: Four times a day (QID) | RECTAL | Status: DC | PRN

## 2017-10-29 MED ORDER — ONDANSETRON HCL 4 MG PO TABS: 4.0000 mg | ORAL_TABLET | Freq: Four times a day (QID) | ORAL | Status: DC | PRN

## 2017-10-29 MED ORDER — OMEGA-3-ACID ETHYL ESTERS 1 G PO CAPS
2.0000 g | ORAL_CAPSULE | Freq: Every day | ORAL | Status: DC
  Administered 2017-10-30 – 2017-11-01 (×3): 2 g via ORAL
  Filled 2017-10-29 (×3): qty 2

## 2017-10-29 MED ORDER — SODIUM CHLORIDE 0.9% FLUSH
3.0000 mL | Freq: Two times a day (BID) | INTRAVENOUS | Status: DC
  Administered 2017-10-29 – 2017-11-01 (×7): 3 mL via INTRAVENOUS

## 2017-10-29 MED ORDER — POLYETHYL GLYCOL-PROPYL GLYCOL 0.4-0.3 % OP SOLN: 1.0000 [drp] | Freq: Three times a day (TID) | OPHTHALMIC | Status: DC | PRN

## 2017-10-29 MED ORDER — OXYCODONE HCL 5 MG PO TABS: 5.0000 mg | ORAL_TABLET | ORAL | Status: DC | PRN

## 2017-10-29 MED ORDER — VITAMIN B-6 50 MG PO TABS
100.0000 mg | ORAL_TABLET | Freq: Every day | ORAL | Status: DC
  Administered 2017-10-30 – 2017-11-01 (×3): 100 mg via ORAL
  Filled 2017-10-29 (×3): qty 2

## 2017-10-29 MED ORDER — POLYVINYL ALCOHOL 1.4 % OP SOLN: 1.0000 [drp] | OPHTHALMIC | Status: DC | PRN

## 2017-10-29 MED ORDER — ATORVASTATIN CALCIUM 40 MG PO TABS
80.0000 mg | ORAL_TABLET | Freq: Every day | ORAL | Status: DC
  Administered 2017-10-29 – 2017-11-01 (×4): 80 mg via ORAL
  Filled 2017-10-29 (×5): qty 2

## 2017-10-29 MED ORDER — VITAMIN D (ERGOCALCIFEROL) 1.25 MG (50000 UNIT) PO CAPS
50000.0000 [IU] | ORAL_CAPSULE | ORAL | Status: DC
  Administered 2017-11-01: 50000 [IU] via ORAL
  Filled 2017-10-29: qty 1

## 2017-10-29 MED ORDER — POTASSIUM CHLORIDE CRYS ER 20 MEQ PO TBCR
20.0000 meq | EXTENDED_RELEASE_TABLET | Freq: Every day | ORAL | Status: DC
  Administered 2017-10-29 – 2017-11-01 (×4): 20 meq via ORAL
  Filled 2017-10-29 (×5): qty 1

## 2017-10-29 MED ORDER — SODIUM CHLORIDE 0.9% FLUSH: 3.0000 mL | INTRAVENOUS | Status: DC | PRN

## 2017-10-29 MED ORDER — TOPIRAMATE 100 MG PO TABS
100.0000 mg | ORAL_TABLET | Freq: Two times a day (BID) | ORAL | Status: DC
  Administered 2017-10-29 – 2017-11-01 (×6): 100 mg via ORAL
  Filled 2017-10-29 (×6): qty 1

## 2017-10-29 MED ORDER — POLYETHYLENE GLYCOL 3350 17 G PO PACK: 17.0000 g | PACK | Freq: Every day | ORAL | Status: DC | PRN

## 2017-10-29 NOTE — Plan of Care (Signed)
Date of achievement 9/27

## 2017-10-29 NOTE — Consult Note (Signed)
[facility]  A. , MD     [email or web address]          Holly Flores is an 71 y.o. female.   ASSESSMENT/PLAN: 1.  Acute right hemiplegia and severe dysarthria: This is likely due to branch MCA on the left side.  I suspect this is most likely due to atrial fibrillation.  As previously noted in my brief note few hours ago, the patient Holly Flores will be held for 3 days and then restarted afterwards because of the risk of hemorrhagic transformation.  CT of the head and neck does not show evidence of intracranial extracranial stenosis that is referable to the patient's stroke.  The patient will need intense physical therapy.   2.  Possible obstructive sleep apnea syndrome: Consider outpatient testing to evaluate for another risk factor for stroke.  The patient is a 71-year-old right-handed black female who presents with the acute onset of severe right-sided weakness and difficulty speaking.  The patient was recently diagnosed with atrial fibrillation and started on Holly Flores.  She reports being compliant with this.  This was recently started by 2 to 3 weeks ago.  She apparently is very functional at baseline.  She has had some psychosocial issues with her husband being in the hospital recently.  Patient does not report loss of consciousness, seizure activity, chest pain or shortness of breath.  The review of systems otherwise negative.   GENERAL: This a pleasant obese female in no acute distress.  HEENT: She does have a large neck and crowded posterior space.  ABDOMEN: soft  EXTREMITIES: No edema   BACK: Normal  SKIN: Normal by inspection.    MENTAL STATUS: She is awake and alert.  She follows commands promptly.  She is severely dysarthric and has reduced spontaneous speech.  She is able to name objects briskly however at bedside testing.  Comprehension is good.  She states her age and the month correctly.  CRANIAL NERVES: Pupils are equal, round and reactive to  light and accomodation; extra ocular movements are full, there is no significant nystagmus; visual fields are full; upper facial muscles are normal in strength and symmetric, there is severe weakness of the lower right facial muscles; tongue is midline; uvula is midline; shoulder elevation is normal.  MOTOR: Right upper extremity 0/5 and right lower extremities also 0/5.  Tone is reduced but bulk is normal.  Left upper extremity is 5/5 and left leg is also 5/5.  No drift on the left side.  COORDINATION: Left finger to nose is normal, No rest tremor; no intention tremor; no postural tremor; no bradykinesia.  REFLEXES: Deep tendon reflexes are symmetrical and normal.   SENSATION: Normal to light touch, temperature, and pain.     NIH stroke scale 2, 2, 3, 3 total equal 10.  Blood pressure (!) 141/67, pulse 63, temperature 98.7 F (37.1 C), temperature source Oral, resp. rate 17, height 5' 1" (1.549 m), weight 95.3 kg, SpO2 94 %.  Past Medical History:  Diagnosis Date  . Anxiety   . Arthritis   . CHF (congestive heart failure) (HCC)   . COPD (chronic obstructive pulmonary disease) (HCC)   . Depression   . GERD (gastroesophageal reflux disease)   . Goiter   . Hyperlipidemia   . Hypertension   . Hypothyroidism   . Kidney cysts    left side  . Myocardial infarction (HCC)    8 yrs ago.   . Shortness of breath   . Sleep apnea      uses CPAP, 3  . Stroke (HCC)   . Stroke (HCC)    OCULAR  LEFT  EYE    LAST YR.    Past Surgical History:  Procedure Laterality Date  . ABDOMINAL HYSTERECTOMY    . BACK SURGERY     x3,cervical and lunmbar, disc.  . BREAST BIOPSY     left-non cancerous  . CARDIAC CATHETERIZATION    . CARPAL TUNNEL RELEASE Left   . CATARACT EXTRACTION W/PHACO  09/21/2011   Procedure: CATARACT EXTRACTION PHACO AND INTRAOCULAR LENS PLACEMENT (IOC);  Surgeon: Holly F Haines, MD;  Location: [facility];  Service: Ophthalmology;  Laterality: Left;  CDE:9.78  . CATARACT  EXTRACTION W/PHACO Right 10/15/2014   Procedure: CATARACT EXTRACTION PHACO AND INTRAOCULAR LENS PLACEMENT (IOC);  Surgeon: Holly F Haines, MD;  Location: [facility];  Service: Ophthalmology;  Laterality: Right;  CDE:4.19  . CERVICAL SPINE SURGERY    . CESAREAN SECTION     x2  . CHOLECYSTECTOMY    . COLONOSCOPY  2006   RMR: 1. Internal hemorroids, otherwise normal rectum. 2. Pedunculated polyp at 35 cm. reomved with snare. The remainder of teh colonic mucosa appeared normal.   . COLONOSCOPY  2011   Dr. Rourk: multiple ascending colon polyps and rectal polyp, adenomatous  . COLONOSCOPY N/A 05/31/2014   Procedure: COLONOSCOPY;  Surgeon: Holly M Rourk, MD;  Location: [facility];  Service: Endoscopy;  Laterality: N/A;  130  . COLONOSCOPY WITH PROPOFOL N/A 06/24/2017   Procedure: COLONOSCOPY WITH PROPOFOL;  Surgeon: Flores, Holly M, MD;  Location: [facility];  Service: Endoscopy;  Laterality: N/A;  2:15pm  . ESOPHAGOGASTRODUODENOSCOPY  2006   Dr. Rourk: Subtle Schatzkis ring, otherwise normal upper GI tract, aside from a small pyloric channell erosion, status post dilation as described above.   . KNEE SURGERY Bilateral   . POLYPECTOMY  06/24/2017   Procedure: POLYPECTOMY;  Surgeon: Flores, Holly M, MD;  Location: [facility];  Service: Endoscopy;;  ascending  . TONSILLECTOMY    . TOTAL KNEE ARTHROPLASTY Right 05/24/2013   DR Flores  . TOTAL KNEE ARTHROPLASTY Right 05/24/2013   Procedure: TOTAL KNEE ARTHROPLASTY;  Surgeon: Holly F Murphy, MD;  Location: [facility];  Service: Orthopedics;  Laterality: Right;    Family History  Problem Relation Age of Onset  . Cancer Other   . Diabetes Other   . Heart disease Other   . Hypertension Other   . Asthma Other   . Alcoholism Other   . Thyroid disease Other   . Rheumatologic disease Other   . Stroke Other   . Hypercholesterolemia Other   . Colon cancer Neg Hx     Social History:  reports that she quit smoking about 30 years ago. Her smoking use  included cigarettes. She has a 5.00 pack-year smoking history. She has never used smokeless tobacco. She reports that she drank alcohol. She reports that she does not use drugs.  Allergies:  Allergies  Allergen Reactions  . Penicillins Hives, Itching and Rash    Has patient had a PCN reaction causing immediate rash, facial/tongue/throat swelling, SOB or lightheadedness with hypotension: Yes Has patient had a PCN reaction causing severe rash involving mucus membranes or skin necrosis: Yes Has patient had a PCN reaction that required hospitalization: No Has patient had a PCN reaction occurring within the last 10 years: No If all of the above answers are "NO", then may proceed with Cephalosporin use.   . Sulfa Antibiotics Swelling      Tongue swelling    Medications: Prior to Admission medications   Medication Sig Start Date End Date Taking? Authorizing Provider  ALPRAZolam (XANAX) 1 MG tablet Take 1 mg by mouth at bedtime.   Yes [provider]  apixaban (Holly Flores) 5 MG TABS tablet Take 1 tablet (5 mg total) by mouth 2 (two) times daily. 09/06/17  Yes Branch, Jonathan F, MD  levothyroxine (LEVOTHROID) 50 MCG tablet Take 50 mcg by mouth daily before breakfast.   Yes [provider]  OVER THE COUNTER MEDICATION Apply 1 application topically 3 (three) times daily as needed (for knee pain.). TWO OLD GOATS ESSENTIAL LOTION FOR ACHES & PAINS   Yes [provider]  oxyCODONE-acetaminophen (ENDOCET) 10-325 MG tablet Take 1 tablet by mouth every 4 (four) hours as needed for pain.   Yes [provider]  Polyethyl Glycol-Propyl Glycol (LUBRICANT EYE DROPS) 0.4-0.3 % SOLN Place 1 drop into both eyes 3 (three) times daily as needed (for dry eyes).   Yes [provider]  potassium chloride SA (K-DUR,KLOR-CON) 20 MEQ tablet Take 20 mEq by mouth daily.   Yes [provider]  torsemide (DEMADEX) 20 MG tablet Take 40 mg by mouth daily.   Yes [provider]  albuterol (PROVENTIL HFA;VENTOLIN HFA) 108 (90 Base) MCG/ACT inhaler Inhale 2 puffs into the lungs every 6 (six) hours as needed (for wheezing/shortness of breath).     [provider]  esomeprazole (NEXIUM) 40 MG capsule Take 40 mg by mouth daily as needed (heartburn). For heartburn    [provider]  Vitamin D, Ergocalciferol, (DRISDOL) 50000 UNITS CAPS Take 50,000 Units by mouth 2 (two) times a week. On Tuesdays and Thursdays    [provider]    Scheduled Meds: . ALPRAZolam  1 mg Oral QHS  . aspirin  325 mg Oral Daily  . atorvastatin  80 mg Oral Daily  . heparin  5,000 Units Subcutaneous Q8H  . [START ON 10/30/2017] Influenza vac split quadrivalent PF  0.5 mL Intramuscular Tomorrow-1000  . levothyroxine  50 mcg Oral QAC breakfast  . omega-3 acid ethyl esters  2 g Oral Daily  . pantoprazole  40 mg Oral Daily  . potassium chloride SA  20 mEq Oral Daily  . pyridOXINE  100 mg Oral Daily  . sodium chloride flush  3 mL Intravenous Q12H  . topiramate  100 mg Oral BID  . [START ON 10/30/2017] torsemide  20 mg Oral Daily  . [START ON 11/01/2017] Vitamin D (Ergocalciferol)  50,000 Units Oral Once per day on Mon Thu   Continuous Infusions: . sodium chloride     PRN Meds:.sodium chloride, acetaminophen **OR** acetaminophen, albuterol, hydrALAZINE, ondansetron **OR** ondansetron (ZOFRAN) IV, oxyCODONE-acetaminophen **AND** oxyCODONE, polyethylene glycol, polyvinyl alcohol, sodium chloride flush, traZODone     Results for orders placed or performed during the hospital encounter of 10/29/17 (from the past 48 hour(s))  Glucose, capillary     Status: None   Collection Time: 10/29/17  4:26 AM  Result Value Ref Range   Glucose-Capillary 98 70 - 99 mg/dL  Ethanol     Status: None   Collection Time: 10/29/17  4:33 AM  Result Value Ref Range   Alcohol, Ethyl (B) <10 <10 mg/dL    Comment: (NOTE) Lowest detectable limit for serum alcohol is 10 mg/dL. For  medical purposes only. Performed at [facility], [address] , [place] [address]   Protime-INR     Status: Abnormal   Collection Time:   10/29/17  4:33 AM  Result Value Ref Range   Prothrombin Time 15.4 (H) 11.4 - 15.2 seconds   INR 1.23     Comment: Performed at [facility], [address], [place], [place] [address]  APTT     Status: None   Collection Time: 10/29/17  4:33 AM  Result Value Ref Range   aPTT 34 24 - 36 seconds    Comment: Performed at [facility], [address] [place], [place] [address]  CBC     Status: Abnormal   Collection Time: 10/29/17  4:33 AM  Result Value Ref Range   WBC 11.3 (H) 4.0 - 10.5 K/uL   RBC 4.32 3.87 - 5.11 MIL/uL   Hemoglobin 13.2 12.0 - 15.0 g/dL   HCT 40.0 36.0 - 46.0 %   MCV 92.6 78.0 - 100.0 fL   MCH 30.6 26.0 - 34.0 pg   MCHC 33.0 30.0 - 36.0 g/dL   RDW 13.8 11.5 - 15.5 %   Platelets 245 150 - 400 K/uL    Comment: Performed at [facility], [address] [place], [place] [address]  Differential     Status: Abnormal   Collection Time: 10/29/17  4:33 AM  Result Value Ref Range   Neutrophils Relative % 41 %   Neutro Abs 4.6 1.7 - 7.7 K/uL   Lymphocytes Relative 47 %   Lymphs Abs 5.4 (H) 0.7 - 4.0 K/uL   Monocytes Relative 7 %   Monocytes Absolute 0.8 0.1 - 1.0 K/uL   Eosinophils Relative 5 %   Eosinophils Absolute 0.5 0.0 - 0.7 K/uL   Basophils Relative 0 %   Basophils Absolute 0.1 0.0 - 0.1 K/uL    Comment: Performed at [facility], [address], [place], [place] [address]  Comprehensive metabolic panel     Status: Abnormal   Collection Time: 10/29/17  4:33 AM  Result Value Ref Range   Sodium 143 135 - 145 mmol/L   Potassium 3.4 (L) 3.5 - 5.1 mmol/L   Chloride 110 98 - 111 mmol/L   CO2 25 22 - 32 mmol/L   Glucose, Bld 115 (H) 70 - 99 mg/dL   BUN 15 8 - 23 mg/dL   Creatinine, Ser 1.28 (H) 0.44 - 1.00 mg/dL   Calcium 8.7 (L) 8.9 - 10.3 mg/dL   Total Protein 7.3 6.5 - 8.1 g/dL   Albumin 3.6 3.5 - 5.0 g/dL   AST 13  (L) 15 - 41 U/L   ALT 8 0 - 44 U/L   Alkaline Phosphatase 77 38 - 126 U/L   Total Bilirubin 0.5 0.3 - 1.2 mg/dL   GFR calc non Af Amer 41 (L) >60 mL/min   GFR calc Af Amer 48 (L) >60 mL/min    Comment: (NOTE) The eGFR has been calculated using the CKD EPI equation. This calculation has not been validated in all clinical situations. eGFR's persistently <60 mL/min signify possible Chronic Kidney Disease.    Anion gap 8 5 - 15    Comment: Performed at [facility], [address] [place], [place] [address]  Troponin I     Status: None   Collection Time: 10/29/17  5:39 AM  Result Value Ref Range   Troponin I <0.03 <0.03 ng/mL    Comment: Performed at [facility], [address], [place], [place] [address]    Studies/Results: HEAD NECK CTA FINDINGS: CTA NECK FINDINGS  Aortic arch: There is a common origin of the left common carotid artery and  the innominate artery. No significant stenosis is present. Atherosclerotic changes are present. There is no aneurysm.  Right carotid system: The right common carotid artery is within normal limits. Bifurcation is unremarkable. The cervical right ICA is tortuous without significant stenosis. Minimal atherosclerotic calcifications  Left carotid system: The left common carotid artery is within normal limits. The bifurcation is within normal limits. There is mild tortuosity of the cervical left ICA without significant stenosis.  Vertebral arteries: The left vertebral artery is dominant. Both vertebral arteries originate from the subclavian arteries without significant stenosis. There is no focal stenosis or injury to either vascular vertebral artery in the neck.  Skeleton: Extensive anterior cervical spine fusion is present C3-6. Vertebral body heights are maintained. No focal lytic or blastic lesions are present.  Other neck: A multinodular goiter extends into the upper mediastinum. There is no dominant lesion. Salivary glands are  within normal limits. No significant adenopathy is present.  Upper chest: The lung apices demonstrate mild ground-glass attenuation without focal airspace consolidation. No nodule or mass lesion is present.  Review of the MIP images confirms the above findings  CTA HEAD FINDINGS  Anterior circulation: Minimal calcifications are present within the cavernous internal carotid arteries. There is no significant stenosis from the skull base through the ICA terminus bilaterally. The anterior communicating artery is patent. A1 and M1 segments are normal. The ACA and MCA branch vessels are within normal limits. MCA bifurcations are unremarkable.  Posterior circulation: The left vertebral artery is dominant vessel. PICA origin is visualized and normal. Vertebrobasilar junction is. Both posterior cerebral arteries originate from the basilar tip. 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 PCA branch vessels are within normal limits bilaterally.  Venous sinuses: The dural sinuses are patent. Straight sinus and deep cerebral veins are intact. Cortical veins are within normal limits.  Anatomic variants: None  Delayed phase: The postcontrast images exaggerate subcortical white matter changes bilaterally. The left internal capsule infarct is still poorly seen. No pathologic enhancement is present.  Review of the MIP images confirms the above findings  IMPRESSION: 1. No large vessel occlusion. 2. No significant proximal stenosis account for the patient's infarct. 3. 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 This can be followed with MRA or CTA on an annual basis. 4. Multinodular goiter. 5. Moderate diffuse white matter disease likely reflects the sequela of chronic microvascular ischemia.   The MRI is reviewed in person and shows a moderate size left infarct.  This involves the basal ganglia on the left side.  It extends to the posterior  limb of the internal capsule.  This is bigger than a lacunar infarct and likely is due to chronic atrial fibrillation. There is a chronic micro-hemorrhage involving the left caudate nuclei.        A. , M.D.  Diplomate, American Board of Psychiatry and Neurology ( Neurology). 10/29/2017, 11:33 PM      

## 2017-10-29 NOTE — Progress Notes (Signed)
Curbside consult: The hospitalist called to discuss the case with me. The concern is that the telemetry neurologist recommended discontinuation of Eliquis est and initiation of aspirin. The MRI is reviewed in person and shows a moderate size left infarct. This is bigger than a lacunar infarct and likely is due to chronic atrial fibrillation. There is a chronic micro-hemorrhage involving the left caudate nuclei. It is reasonable to continue holding the request for 3 days and restarted afterwards to reduce the risk of hemorrhagic transformation. The aspirin can be discontinued after that. The other outstanding issues through is to follow-up CTA of the head and neck to rule out intracranial extracranial occlusive disease.

## 2017-10-29 NOTE — Evaluation (Signed)
Occupational Therapy Evaluation Patient Details Name: Holly Flores MRN: [id number] DOB: Feb 25, 1945 Today's Date: 10/29/2017    History of Present Illness Patient is a 72 year old female with past medical history of CHF, COPD, hypertension, coronary artery disease.  She presents today for evaluation of weakness in her right arm and right leg.  She states that she was in her normal state of health, however when she woke up at 3:30 AM she was having difficulty moving her right arm and right leg.  911 was called and the patient was brought here as a code stroke.  She denies any headache.  She denies any injury or trauma.  She is currently taking Eliquis.   Clinical Impression   Patient presents with 0 volitional movement in RUE, patient's dominant arm.  Patient requiring max pa for bed mobility and transfers, and mod-max assist with grooming and feeding this date.  Patient experiencing deficits in peripheral vision and diplopia that disappears with closing left eye.  Patient will benefit from extensive rehab for neuromuscular reeducation of RUE, improved independence with ADLs with compensatory techniques.  Patient has extensive family support.    Follow Up Recommendations  CIR    Equipment Recommendations    n/a   Recommendations for Other Services   n/a      Precautions / Restrictions Precautions Precautions: Fall Restrictions Weight Bearing Restrictions: No      Mobility Bed Mobility Overal bed mobility: Needs Assistance             General bed mobility comments: refer to PT assessment   Transfers Overall transfer level: Needs assistance               General transfer comment: refer to PT assessment    Balance                                           ADL either performed or assessed with clinical judgement   ADL Overall ADL's : Needs assistance/impaired                                     Functional mobility during ADLs:  Maximal assistance General ADL Comments: patient unable to use her dominant right hand with any functional activities.  patient will need education on compensatory activities for adl independence.      Vision Baseline Vision/History: No visual deficits Patient Visual Report: Diplopia;Peripheral vision impairment Vision Assessment?: Yes Eye Alignment: Within Functional Limits Ocular Range of Motion: Within Functional Limits Alignment/Gaze Preference: Within Defined Limits Tracking/Visual Pursuits: Able to track stimulus in all quads without difficulty Diplopia Assessment: Disappears with one eye closed Additional Comments: peripheral vision limited by 25% on both right and left side      Perception     Praxis      Pertinent Vitals/Pain Pain Assessment: No/denies pain     Hand Dominance Right   Extremity/Trunk Assessment Upper Extremity Assessment Upper Extremity Assessment: RUE deficits/detail RUE Deficits / Details: RUE with absent volitional movement no active movement noted in RUE.  Stage II Brunnstrom with no flexor or extensor synergy pattern noted.  PROM is [facility].  Patient reports shoulder range of motion limited at baseline.  RUE Sensation: (needs further assessment) RUE Coordination: decreased fine motor;decreased gross motor   Lower Extremity Assessment Lower Extremity Assessment: Defer  to PT evaluation       Communication Communication Communication: No difficulties   Cognition Arousal/Alertness: Awake/alert Behavior During Therapy: WFL for tasks assessed/performed Overall Cognitive Status: Within Functional Limits for tasks assessed                                     General Comments       Exercises     Shoulder Instructions      Home Living Family/patient expects to be discharged to:: Private residence Living Arrangements: Spouse/significant other Available Help at Discharge: Family Type of Home: House Home Access: Stairs to  enter [employer] of Steps: 3 steps onto porch, 2 steps into [employer]: None Home Layout: One level     Bathroom Shower/Tub: Corporate investment banker: Standard Bathroom Accessibility: Yes   Home Equipment: Cane - single point;Walker - 2 wheels;Shower seat;Bedside commode;Wheelchair - manual          Prior Functioning/Environment Level of Independence: Independent        Comments: community ambulator, drives        OT Problem List: Decreased strength;Decreased activity tolerance;Impaired vision/perception;Impaired UE functional use;Impaired sensation;Decreased coordination;Impaired balance (sitting and/or standing);Decreased range of motion      OT Treatment/Interventions: Self-care/ADL training;Energy conservation;Therapeutic exercise;Visual/perceptual remediation/compensation;Manual therapy;Therapeutic activities;Patient/family education;Neuromuscular education    OT Goals(Current goals can be found in the care plan section) Acute Rehab OT Goals Patient Stated Goal: return home after rehab OT Goal Formulation: With patient Time For Goal Achievement: 11/12/17 Potential to Achieve Goals: Good ADL Goals Pt Will Perform Eating: with min guard assist Pt Will Perform Grooming: with min guard assist Pt Will Perform Upper Body Dressing: with mod assist Pt Will Perform Lower Body Dressing: with max assist Pt Will Transfer to Toilet: with max assist Pt/caregiver will Perform Home Exercise Program: Increased ROM;Right Upper extremity;With minimal assist  OT Frequency: Min 2X/week   Barriers to D/C:            Co-evaluation              AM-PAC PT "6 Clicks" Daily Activity     Outcome Measure Help from another person eating meals?: A Lot Help from another person taking care of personal grooming?: A Lot Help from another person toileting, which includes using toliet, bedpan, or urinal?: Total Help from another person bathing  (including washing, rinsing, drying)?: Total Help from another person to put on and taking off regular upper body clothing?: Total Help from another person to put on and taking off regular lower body clothing?: Total 6 Click Score: 8   End of Session Equipment Utilized During Treatment: Other (comment)(towel roll and pillow for hand positioning )  Activity Tolerance: Patient tolerated treatment well Patient left: in bed;with call bell/phone within reach;with family/visitor present  OT Visit Diagnosis: Other symptoms and signs involving the nervous system (R29.898);Unsteadiness on feet (R26.81)                Time: 3154-0086 OT Time Calculation (min): 25 min Charges:  OT General Charges $OT Visit: 1 Visit OT Evaluation $OT Eval Low Complexity: 1 Low OT Treatments $Self Care/Home Management : 8-22 mins Vangie Bicker, [place], OTR/L [phone]  Ar[place]us Ped 10/29/2017, 11:12 PM

## 2017-10-29 NOTE — Plan of Care (Signed)
  Problem: Acute Rehab PT Goals(only PT should resolve) Goal: Pt Will Go Supine/Side To Sit Outcome: Progressing Flowsheets (Taken 10/29/2017 1553) Pt will go Supine/Side to Sit: with moderate assist Goal: Patient Will Transfer Sit To/From Stand Outcome: Progressing Flowsheets (Taken 10/29/2017 1553) Patient will transfer sit to/from stand: with moderate assist Goal: Pt Will Transfer Bed To Chair/Chair To Bed Outcome: Progressing Flowsheets (Taken 10/29/2017 1553) Pt will Transfer Bed to Chair/Chair to Bed: with max assist Goal: Pt Will Ambulate Outcome: Progressing Flowsheets (Taken 10/29/2017 1553) Pt will Ambulate: 10 feet; with maximum assist Note:  Hemi-walker   3:54 PM, 10/29/17 Lonell Grandchild, MPT Physical Therapist with [facility] [phone] office [phone] mobile phone

## 2017-10-29 NOTE — ED Notes (Signed)
Patient transported to MRI 

## 2017-10-29 NOTE — Evaluation (Signed)
Physical Therapy Evaluation Patient Details Name: Holly Flores MRN: [id number] DOB: Dec 22, 1945 Today's Date: 10/29/2017   History of Present Illness  Patient is a 72 year old female with past medical history of CHF, COPD, hypertension, coronary artery disease.  She presents today for evaluation of weakness in her right arm and right leg.  She states that she was in her normal state of health, however when she woke up at 3:30 AM she was having difficulty moving her right arm and right leg.  911 was called and the patient was brought here as a code stroke.  She denies any headache.  She denies any injury or trauma.  She is currently taking Eliquis.    Clinical Impression  Patient unable to lift RUE/RLE against gravity due to weakness or grip RW using right hand, limited to a few side steps with Max tactile cueing to move RLE due to weakness.  Patient tolerated standing and taking a few side steps before having to sit due to fatigue and required Max/total assist of 2 to reposition when put back to bed.  Patient will benefit from continued physical therapy in hospital and recommended venue below to increase strength, balance, endurance for safe ADLs and gait.    Follow Up Recommendations SNF    Equipment Recommendations  None recommended by PT    Recommendations for Other Services       Precautions / Restrictions Precautions Precautions: Fall Restrictions Weight Bearing Restrictions: No      Mobility  Bed Mobility Overal bed mobility: Needs Assistance Bed Mobility: Supine to Sit;Sit to Supine     Supine to sit: Max assist Sit to supine: Max assist   General bed mobility comments: slow labored movement  Transfers Overall transfer level: Needs assistance Equipment used: Rolling walker (2 wheeled);1 person hand held assist Transfers: Sit to/from Stand Sit to Stand: Max assist         General transfer comment: unable to grip RW with right hand due to  weakness  Ambulation/Gait Ambulation/Gait assistance: Max assist Gait Distance (Feet): 2 Feet Assistive device: 1 person hand held assist Gait Pattern/deviations: Decreased step length - right;Decreased stance time - right;Decreased stride length Gait velocity: slow   General Gait Details: limited to 3-4 shuffling side steps with Max tactile assist to move RLE at bedside, unsafe to attempt taking steps away from bedside  Stairs            Wheelchair Mobility    Modified Rankin (Stroke Patients Only)       Balance Overall balance assessment: Needs assistance Sitting-balance support: Feet supported;No upper extremity supported Sitting balance-Leahy Scale: Fair     Standing balance support: During functional activity;Single extremity supported Standing balance-Leahy Scale: Poor Standing balance comment: requires Max hand held assist to stand                             Pertinent Vitals/Pain Pain Assessment: No/denies pain    Home Living Family/patient expects to be discharged to:: Private residence Living Arrangements: Spouse/significant other Available Help at Discharge: Family Type of Home: House Home Access: Stairs to enter Entrance Stairs-Rails: None Entrance Stairs-Number of Steps: 3 steps onto porch, 2 steps into [employer]: One level Home Equipment: Cane - single point;Walker - 2 wheels;Shower seat;Bedside commode;Wheelchair - manual      Prior Function Level of Independence: Independent         Comments: community ambulator, drives  Hand Dominance        Extremity/Trunk Assessment   Upper Extremity Assessment Upper Extremity Assessment: Defer to OT evaluation    Lower Extremity Assessment Lower Extremity Assessment: Generalized weakness;RLE deficits/detail;LLE deficits/detail RLE Deficits / Details: grossly -2/5, mostly trace movement noted, unable to lift against gravity LLE Deficits / Details: grossly -5/5        Communication   Communication: No difficulties;Expressive difficulties  Cognition Arousal/Alertness: Awake/alert Behavior During Therapy: WFL for tasks assessed/performed Overall Cognitive Status: Within Functional Limits for tasks assessed                                        General Comments      Exercises     Assessment/Plan    PT Assessment Patient needs continued PT services  PT Problem List Decreased strength;Decreased activity tolerance;Decreased balance;Decreased mobility       PT Treatment Interventions Gait training;Stair training;Functional mobility training;Therapeutic activities;Therapeutic exercise;Patient/family education;Neuromuscular re-education    PT Goals (Current goals can be found in the Care Plan section)  Acute Rehab PT Goals Patient Stated Goal: return home after rehab PT Goal Formulation: With patient/family Time For Goal Achievement: 11/12/17 Potential to Achieve Goals: Fair    Frequency 7X/week   Barriers to discharge        Co-evaluation               AM-PAC PT "6 Clicks" Daily Activity  Outcome Measure Difficulty turning over in bed (including adjusting bedclothes, sheets and blankets)?: A Lot Difficulty moving from lying on back to sitting on the side of the bed? : A Lot Difficulty sitting down on and standing up from a chair with arms (e.g., wheelchair, bedside commode, etc,.)?: A Lot Help needed moving to and from a bed to chair (including a wheelchair)?: Total Help needed walking in hospital room?: Total Help needed climbing 3-5 steps with a railing? : Total 6 Click Score: 9    End of Session Equipment Utilized During Treatment: Gait belt Activity Tolerance: Patient tolerated treatment well;Patient limited by fatigue Patient left: in bed;with call bell/phone within reach;with family/visitor present Nurse Communication: Mobility status PT Visit Diagnosis: Other abnormalities of gait and mobility  (R26.89);Unsteadiness on feet (R26.81);Muscle weakness (generalized) (M62.81)    Time: 1440-1511 PT Time Calculation (min) (ACUTE ONLY): 31 min   Charges:   PT Evaluation $PT Eval Moderate Complexity: 1 Mod PT Treatments $Therapeutic Activity: 23-37 mins        3:51 PM, 10/29/17 Lonell Grandchild, MPT Physical Therapist with [facility] [phone] office [phone] mobile phone

## 2017-10-29 NOTE — ED Triage Notes (Signed)
Pt in by [facility] for possible stroke.  Pt states she got in bed at midnight and felt normal.  Pt states she woke up this am and had right arm weakness and right facial droop.

## 2017-10-29 NOTE — Consult Note (Signed)
[facility] TeleNeurology Consult Services   The patient was informed the neurology consult would happen via TeleHealth by way of interactive audio and visual telecommunications and consented to receiving care in this manner for acute stroke protocol. DATE: October 29, 2017 Impression: Stroke The patient appears to be having left hemispheric subcortical stroke syndrome with an NIH of only 3.  Atrial fibrillation is the most likely mechanism although she is on full anticoagulation already.  Would hold her full anticoagulation for now and bridge with full strength aspirin one her next dose of Eliquis would be do likely this am until MRI of the brain and follow-up neurologic evaluation can be performed.  This is to avoid hemorrhagic conversion.  Recommend permissive hypertension per non-TPA stroke protocol.  She will need inpatient neurology consultation and stroke workup.  Not a tpa candidate due to: On full anticoagulation last known normal over 4.5 hours ago  Symptoms (not) consistent with LVO therefore no role for neuro-intervention  Differential Diagnosis: 1. Cardioembolic stroke 2. Small vessel disease/lacune 3. Thromboembolic, artery-to-artery mechanism 4. Hypercoagulable state-related infarct 5. Transient ischemic attack 6. Thrombotic mechanism, large artery disease  Comments: Last known normal midnight Door time 04:22 [facility] contacted: 04:37 [facility] at bedside: 04:41 NIHSS assessment time: 04:41  Recommendations: -Stroke workup as above -Permissive hypertension per non-TPA stroke protocol -hold elquis for now with asa bridge  Inpatient neurology consultation Inpatient stroke evaluation as per Neurology/ Internal Medicine Discussed with ED MD Please call with questions ----------------------------------------------------------------------------------------- CC right-sided weakness and numbness  History of Present Illness  Patient is a very pleasant  72 year old woman with a history of a left central artery retinal occlusion, hyperlipidemia, atrial fibrillation on full anticoagulation with Eliquis who this evening around midnight noticed that her right side wasn't working in terms of lifting her right leg or arm up.  When symptoms did not improve after going to bed for a brief period of time she presented to the hospital for evaluation.  She denies ever having history of a cerebral stroke.  Blood pressure moderately hypertensive no headache or chest pain.  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  Diagnostic: CT head without contrast no acute changes  Exam: 1a- LOC: Keenly responsive - =0     1b- LOC questions: Answers both questions correctly - 0     1c- LOC commands- Performs both tasks correctly- 0     2- Gaze: Normal; no gaze paresis or gaze deviation - 0     3- Visual Fields: normal, no Visual field deficit - 0     4- Facial movements: no facial palsy - 0     5- Upper limb motor -RUE=1 6- Lower limb motor - RLE=1  7- Limb Coordination: absent ataxia - 0      8- Sensory :right dec sensation=1 9- Language - No aphasia - 0      10- Speech - No dysarthria -0     11- Neglect / Extinction - none found -0     NIHSS score  3   Medical Decision Making: - Extensive number of diagnosis or management options are considered above. - Extensive amount of complex data reviewed. - High risk of complication and/or morbidity or mortality are associated with differential diagnostic considerations above. - There may be Uncertain outcome and increased probability of prolonged functional impairment or high probability of severe prolonged functional impairment associated with some of these differential diagnosis. Medical Data Reviewed: 1.Data reviewed include clinical labs, radiology,  Medical Tests; 2.Tests results discussed w/performing or interpreting physician; 3.Obtaining/reviewing old medical  records; 4.Obtaining case history from another source; 5.Independent review of image, tracing or specimen. Patient was informed the Neurology Consult would happen via telehealth (remote video) and consented to receiving care in this manner.

## 2017-10-29 NOTE — Progress Notes (Signed)
Code stroke  CALLED   [address]   [facility] [address] [facility]   [address]   [place]

## 2017-10-29 NOTE — H&P (Signed)
Patient Demographics:    Holly Flores, is a 72 y.o. female  MRN: [id number]   DOB - 26-Feb-1945  Admit Date - 10/29/2017  Outpatient Primary MD for the patient is Octavio Graves, DO   Assessment & Plan:    Principal Problem:   CVA (cerebral vascular accident) [facility]) Active Problems:   Hyperlipidemia   Central retinal vein occlusion of left eye   COPD (chronic obstructive pulmonary disease) ([place])   Chronic diastolic CHF (congestive heart failure) ([place])   Hypothyroidism   Essential hypertension   GERD (gastroesophageal reflux disease)   Anxiety     1)Acute CVA - patient presented with right-sided hemiparesis and brain MRI with  Acute infarct left external capsule posteriorly. place on telemetry monitored unit,d/w neurologist  Dr. Jerral Bonito who recommends holding Eliquis in order to avoid hemorrhagic transformation, treat empirically with aspirin 325 mg daily for the next 3 days then may go back to Eliquis, patient prior to admission was on Zocor with increased to Lipitor 80 mg daily, we will check echocardiogram to rule out intracardiac thrombus  and to evaluate EF , CTA Neck /Head without  large Vessel occlusion or hemodynamically significant stenosis pending.     PT/OT eval requested speech eval requested, We will allow some permissive hypertension in view of possible acute stroke, avoid precipitous drop in blood pressure. Use Hydralazine 10mg  iv every 4 hrs as needed for systolic blood pressure over 220 mmhg , check TSH, A1c and fasting lipid profile,    # Frequent neuro checks   2)HTN- Allow some permissive Hypertension due to acute stroke, avoid very aggressive, rapid BP control at this time. Use Hydralazine 10mg  iv every 4 hrs as needed for systolic blood pressure over 220 mmhg ,   3)PAFib/Flutter  -Eliquis on hold for now for the next 72 hours to avoid hemorrhagic transformation, continue aspirin 325 mg daily as advised by neurologist for now   With History of - Reviewed by me  Past Medical History:  Diagnosis Date  . Anxiety   . Arthritis   . CHF (congestive heart failure) ([place])   . COPD (chronic obstructive pulmonary disease) ([place])   . Depression   . GERD (gastroesophageal reflux disease)   . Goiter   . Hyperlipidemia   . Hypertension   . Hypothyroidism   . Kidney cysts    left side  . Myocardial infarction ([place])    8 yrs ago.   Marland Kitchen Shortness of breath   . Sleep apnea    uses CPAP, 3  . Stroke ([place])   . Stroke ([place])    OCULAR  LEFT  EYE    LAST YR.      Past Surgical History:  Procedure Laterality Date  . ABDOMINAL HYSTERECTOMY    . BACK SURGERY     x3,cervical and lunmbar, disc.  Marland Kitchen BREAST BIOPSY     left-non cancerous  . CARDIAC CATHETERIZATION    . CARPAL TUNNEL RELEASE  Left   . CATARACT EXTRACTION W/PHACO  09/21/2011   Procedure: CATARACT EXTRACTION PHACO AND INTRAOCULAR LENS PLACEMENT (IOC);  Surgeon: Williams Che, MD;  Location: [facility];  Service: Ophthalmology;  Laterality: Left;  CDE:9.78  . CATARACT EXTRACTION W/PHACO Right 10/15/2014   Procedure: CATARACT EXTRACTION PHACO AND INTRAOCULAR LENS PLACEMENT ([place]);  Surgeon: Williams Che, MD;  Location: [facility];  Service: Ophthalmology;  Laterality: Right;  CDE:4.19  . CERVICAL SPINE SURGERY    . CESAREAN SECTION     x2  . CHOLECYSTECTOMY    . COLONOSCOPY  2006   RMR: 1. Internal hemorroids, otherwise normal rectum. 2. Pedunculated polyp at 35 cm. reomved with snare. The remainder of teh colonic mucosa appeared normal.   . COLONOSCOPY  2011   Dr. Gala Romney: multiple ascending colon polyps and rectal polyp, adenomatous  . COLONOSCOPY N/A 05/31/2014   Procedure: COLONOSCOPY;  Surgeon: Daneil Dolin, MD;  Location: [facility];  Service: Endoscopy;  Laterality: N/A;  130  . COLONOSCOPY WITH PROPOFOL N/A  06/24/2017   Procedure: COLONOSCOPY WITH PROPOFOL;  Surgeon: Daneil Dolin, MD;  Location: [facility];  Service: Endoscopy;  Laterality: N/A;  2:15pm  . ESOPHAGOGASTRODUODENOSCOPY  2006   Dr. Gala Romney: Subtle Schatzkis ring, otherwise normal upper GI tract, aside from a small pyloric channell erosion, status post dilation as described above.   Marland Kitchen KNEE SURGERY Bilateral   . POLYPECTOMY  06/24/2017   Procedure: POLYPECTOMY;  Surgeon: Daneil Dolin, MD;  Location: [facility];  Service: Endoscopy;;  ascending  . TONSILLECTOMY    . TOTAL KNEE ARTHROPLASTY Right 05/24/2013   DR MURPHY  . TOTAL KNEE ARTHROPLASTY Right 05/24/2013   Procedure: TOTAL KNEE ARTHROPLASTY;  Surgeon: [name], MD;  Location: [place];  Service: Orthopedics;  Laterality: Right;      Chief Complaint  Patient presents with  . Code Stroke      HPI:    Holly Flores  is a 72 y.o. female with hx of prior CVA resulting in baseline mild Left hemiparesis, history of migraine headaches, HTN, dCHF, GERD, COPD, obesity, OSA, history of atrial flutter/A. Fib who was started on Eliquis on 09/06/2017 presents to the ED with concerns of right-sided weakness And apparently woke up around 3:30 AM and had difficulty with right-sided weakness so she called 911, no headache, no vomiting, no chest pains no palpitations no dizziness no shortness of breath  In ED-initial CT head was negative, telemetry neurology evaluation was obtained by ED provider and request for admission to the hospital was placed to hospitalist service  Subsequent work-up in the ED revealed-persistent right-sided hemiparesis   MRI head with Acute infarct left external capsule posteriorly Moderate chronic microvascular ischemic change. Chronic microhemorrhage left frontal white matter adjacent to the head of caudate.   CTA head and Neck w/o  large vessel occlusion, and No significant proximal stenosis account for the patient's infarct.   Review of systems:      In addition to the HPI above,   A full Review of  Systems was done, all other systems reviewed are negative except as noted above in HPI , .    Social History:  Reviewed by me    Social History   Tobacco Use  . Smoking status: Former Smoker    Packs/day: 0.50    Years: 10.00    Pack years: 5.00    Types: Cigarettes    Last attempt to quit: 04/21/1987    Years  since quitting: 30.5  . Smokeless tobacco: Never Used  . Tobacco comment: smokes cigarette if she has a headache which helps.  Substance Use Topics  . Alcohol use: Not Currently    Comment: previously 2 glasses of wine a day       Family History :  Reviewed by me    Family History  Problem Relation Age of Onset  . Cancer Other   . Diabetes Other   . Heart disease Other   . Hypertension Other   . Asthma Other   . Alcoholism Other   . Thyroid disease Other   . Rheumatologic disease Other   . Stroke Other   . Hypercholesterolemia Other   . Colon cancer Neg Hx     Home Medications:   Prior to Admission medications   Medication Sig Start Date End Date Taking? Authorizing Provider  albuterol (PROVENTIL HFA;VENTOLIN HFA) 108 (90 Base) MCG/ACT inhaler Inhale 2 puffs into the lungs every 6 (six) hours as needed (for wheezing/shortness of breath).     [provider]  ALPRAZolam Duanne Moron) 1 MG tablet Take 1 mg by mouth at bedtime.    [provider]  apixaban (ELIQUIS) 5 MG TABS tablet Take 1 tablet (5 mg total) by mouth 2 (two) times daily. 09/06/17   Arnoldo Lenis, MD  esomeprazole (NEXIUM) 40 MG capsule Take 40 mg by mouth daily as needed (heartburn). For heartburn    [provider]  levothyroxine (LEVOTHROID) 50 MCG tablet Take 50 mcg by mouth daily before breakfast.    [provider]  Omega-3 Fatty Acids (FISH OIL) 500 MG CAPS Take 500 mg by mouth daily.    [provider]  OVER THE COUNTER MEDICATION Apply 1 application topically 3 (three) times daily as  needed (for knee pain.). TWO OLD GOATS ESSENTIAL LOTION FOR ACHES & PAINS    [provider]  oxyCODONE-acetaminophen (ENDOCET) 10-325 MG tablet Take 1 tablet by mouth every 4 (four) hours as needed for pain.    [provider]  Polyethyl Glycol-Propyl Glycol (LUBRICANT EYE DROPS) 0.4-0.3 % SOLN Place 1 drop into both eyes 3 (three) times daily as needed (for dry eyes).    [provider]  potassium chloride SA (K-DUR,KLOR-CON) 20 MEQ tablet Take 20 mEq by mouth daily.    [provider]  Pyridoxine HCl (VITAMIN B-6 PO) Take 1 tablet by mouth daily.    [provider]  topiramate (TOPAMAX) 100 MG tablet Take 100 mg by mouth 2 (two) times daily.    [provider]  torsemide (DEMADEX) 20 MG tablet Take 40 mg by mouth daily.    [provider]  Vitamin D, Ergocalciferol, (DRISDOL) 50000 UNITS CAPS Take 50,000 Units by mouth 2 (two) times a week. On Tuesdays and Thursdays    [provider]     Allergies:     Allergies  Allergen Reactions  . Penicillins Hives, Itching and Rash    Has patient had a PCN reaction causing immediate rash, facial/tongue/throat swelling, SOB or lightheadedness with hypotension: Yes Has patient had a PCN reaction causing severe rash involving mucus membranes or skin necrosis: Yes Has patient had a PCN reaction that required hospitalization: No Has patient had a PCN reaction occurring within the last 10 years: No If all of the above answers are "NO", then may proceed with Cephalosporin use.   . Sulfa Antibiotics Swelling    Tongue swelling     Physical Exam:   Vitals  Blood pressure 124/65, pulse (!) 57, temperature 97.7 F (36.5 C), temperature source Oral, resp. rate 19, height 5\' 1"  (1.549 m), weight 95.3 kg, SpO2 97 %.  Physical Examination: General appearance - alert, well appearing, and in no distress  Mental status - alert, oriented to person, place, and time, Eyes - sclera  anicteric Neck - supple, no JVD elevation , Chest - clear  to auscultation bilaterally, symmetrical air movement,  Heart - S1 and S2 normal, irregularly irregular Abdomen - soft, nontender, nondistended, no masses or organomegaly Neurological -right lower extremities pretty weak with strength of 2/5, right upper extremities 4/5 extremities - no pedal edema noted, intact peripheral pulses  Skin - warm, dry Neurologial Exam: Mental Status: Patient is awake, alert, oriented to person, place, month, year, and situation. Patient is able to give a clear and coherent history. No signs of aphasia or neglect Cranial Nerves: II: Visual Fields are full. Pupils are equal, round, and reactive to light.   III,IV, VI: EOMI without ptosis or diploplia.  V: Facial sensation is symmetrical and wnl VII: Facial movement is symmetric.  VIII: hearing is intact to voice X: Uvula elevates symmetrically XI: Shoulder shrug is symmetric. XII: tongue is midline without atrophy or fasciculations.  Motor: right lower extremities pretty weak with strength of 2/5, right upper extremities 4/5  Sensory: Sensation is dish in the right upper and right lower extremity to pinprick and to touch Cerebellar: FNF without ataxia      Data Review:    CBC Recent Labs  Lab 10/29/17 0433  WBC 11.3*  HGB 13.2  HCT 40.0  PLT 245  MCV 92.6  MCH 30.6  MCHC 33.0  RDW 13.8  LYMPHSABS 5.4*  MONOABS 0.8  EOSABS 0.5  BASOSABS 0.1   ------------------------------------------------------------------------------------------------------------------  Chemistries  Recent Labs  Lab 10/29/17 0433  NA 143  K 3.4*  CL 110  CO2 25  GLUCOSE 115*  BUN 15  CREATININE 1.28*  CALCIUM 8.7*  AST 13*  ALT 8  ALKPHOS 77  BILITOT 0.5   ------------------------------------------------------------------------------------------------------------------ estimated creatinine clearance is 42.5 mL/min (A) (by C-G formula based on  SCr of 1.28 mg/dL (H)). ------------------------------------------------------------------------------------------------------------------ No results for input(s): TSH, T4TOTAL, T3FREE, THYROIDAB in the last 72 hours.  Invalid input(s): FREET3   Coagulation profile Recent Labs  Lab 10/29/17 0433  INR 1.23   ------------------------------------------------------------------------------------------------------------------- No results for input(s): DDIMER in the last 72 hours. -------------------------------------------------------------------------------------------------------------------  Cardiac Enzymes Recent Labs  Lab 10/29/17 0539  TROPONINI <0.03   ------------------------------------------------------------------------------------------------------------------    Component Value Date/Time   BNP 38.0 01/16/2015 1219     ---------------------------------------------------------------------------------------------------------------  Urinalysis    Component Value Date/Time   COLORURINE YELLOW 12/29/2014 2314   APPEARANCEUR CLEAR 12/29/2014 2314   LABSPEC 1.025 12/29/2014 2314   PHURINE 5.5 12/29/2014 2314   GLUCOSEU NEGATIVE 12/29/2014 2314   HGBUR SMALL (A) 12/29/2014 2314   BILIRUBINUR NEGATIVE 12/29/2014 2314   KETONESUR NEGATIVE 12/29/2014 2314   PROTEINUR NEGATIVE 12/29/2014 2314   UROBILINOGEN 0.2 12/29/2014 2314   NITRITE NEGATIVE 12/29/2014 2314   LEUKOCYTESUR NEGATIVE 12/29/2014 2314    ----------------------------------------------------------------------------------------------------------------   Imaging Results:    Ct Angio Head W Or Wo Contrast  Result Date: 10/29/2017 CLINICAL DATA:  Stroke, wall of. Weakness. Acute infarct of the left external capsule EXAM: CT ANGIOGRAPHY HEAD AND NECK TECHNIQUE: Multidetector CT imaging of the head and neck was performed using the standard protocol during bolus administration of intravenous contrast.  Multiplanar CT image reconstructions and MIPs  were obtained to evaluate the vascular anatomy. Carotid stenosis measurements (when applicable) are obtained utilizing NASCET criteria, using the distal internal carotid diameter as the denominator. CONTRAST:  76mL ISOVUE-370 IOPAMIDOL (ISOVUE-370) INJECTION 76% COMPARISON:  MRI brain 10/29/2017. CT head without contrast 10/29/2017. FINDINGS: CTA NECK FINDINGS Aortic arch: There is a common origin of the left common carotid artery and the innominate artery. No significant stenosis is present. Atherosclerotic changes are present. There is no aneurysm. Right carotid system: The right common carotid artery is within normal limits. Bifurcation is unremarkable. The cervical right ICA is tortuous without significant stenosis. Minimal atherosclerotic calcifications Left carotid system: The left common carotid artery is within normal limits. The bifurcation is within normal limits. There is mild tortuosity of the cervical left ICA without significant stenosis. Vertebral arteries: The left vertebral artery is dominant. Both vertebral arteries originate from the subclavian arteries without significant stenosis. There is no focal stenosis or injury to either vascular vertebral artery in the neck. Skeleton: Extensive anterior cervical spine fusion is present C3-6. Vertebral body heights are maintained. No focal lytic or blastic lesions are present. Other neck: A multinodular goiter extends into the upper mediastinum. There is no dominant lesion. Salivary glands are within normal limits. No significant adenopathy is present. Upper chest: The lung apices demonstrate mild ground-glass attenuation without focal airspace consolidation. No nodule or mass lesion is present. Review of the MIP images confirms the above findings CTA HEAD FINDINGS Anterior circulation: Minimal calcifications are present within the cavernous internal carotid arteries. There is no significant stenosis from the  skull base through the ICA terminus bilaterally. The anterior communicating artery is patent. A1 and M1 segments are normal. The ACA and MCA branch vessels are within normal limits. MCA bifurcations are unremarkable. Posterior circulation: The left vertebral artery is dominant vessel. PICA origin is visualized and normal. Vertebrobasilar junction is. Both posterior cerebral arteries originate from the basilar tip. 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 PCA branch vessels are within normal limits bilaterally. Venous sinuses: The dural sinuses are patent. Straight sinus and deep cerebral veins are intact. Cortical veins are within normal limits. Anatomic variants: None Delayed phase: The postcontrast images exaggerate subcortical white matter changes bilaterally. The left internal capsule infarct is still poorly seen. No pathologic enhancement is present. Review of the MIP images confirms the above findings IMPRESSION: 1. No large vessel occlusion. 2. No significant proximal stenosis account for the patient's infarct. 3. 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 This can be followed with MRA or CTA on an annual basis. 4. Multinodular goiter. 5. Moderate diffuse white matter disease likely reflects the sequela of chronic microvascular ischemia. Electronically Signed   By: San Morelle M.D.   On: 10/29/2017 13:37   Ct Angio Neck W Or Wo Contrast  Result Date: 10/29/2017 CLINICAL DATA:  Stroke, wall of. Weakness. Acute infarct of the left external capsule EXAM: CT ANGIOGRAPHY HEAD AND NECK TECHNIQUE: Multidetector CT imaging of the head and neck was performed using the standard protocol during bolus administration of intravenous contrast. Multiplanar CT image reconstructions and MIPs were obtained to evaluate the vascular anatomy. Carotid stenosis measurements (when applicable) are obtained utilizing NASCET criteria, using the distal internal carotid diameter as the  denominator. CONTRAST:  18mL ISOVUE-370 IOPAMIDOL (ISOVUE-370) INJECTION 76% COMPARISON:  MRI brain 10/29/2017. CT head without contrast 10/29/2017. FINDINGS: CTA NECK FINDINGS Aortic arch: There is a common origin of the left common carotid artery and the  innominate artery. No significant stenosis is present. Atherosclerotic changes are present. There is no aneurysm. Right carotid system: The right common carotid artery is within normal limits. Bifurcation is unremarkable. The cervical right ICA is tortuous without significant stenosis. Minimal atherosclerotic calcifications Left carotid system: The left common carotid artery is within normal limits. The bifurcation is within normal limits. There is mild tortuosity of the cervical left ICA without significant stenosis. Vertebral arteries: The left vertebral artery is dominant. Both vertebral arteries originate from the subclavian arteries without significant stenosis. There is no focal stenosis or injury to either vascular vertebral artery in the neck. Skeleton: Extensive anterior cervical spine fusion is present C3-6. Vertebral body heights are maintained. No focal lytic or blastic lesions are present. Other neck: A multinodular goiter extends into the upper mediastinum. There is no dominant lesion. Salivary glands are within normal limits. No significant adenopathy is present. Upper chest: The lung apices demonstrate mild ground-glass attenuation without focal airspace consolidation. No nodule or mass lesion is present. Review of the MIP images confirms the above findings CTA HEAD FINDINGS Anterior circulation: Minimal calcifications are present within the cavernous internal carotid arteries. There is no significant stenosis from the skull base through the ICA terminus bilaterally. The anterior communicating artery is patent. A1 and M1 segments are normal. The ACA and MCA branch vessels are within normal limits. MCA bifurcations are unremarkable. Posterior  circulation: The left vertebral artery is dominant vessel. PICA origin is visualized and normal. Vertebrobasilar junction is. Both posterior cerebral arteries originate from the basilar tip. 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 PCA branch vessels are within normal limits bilaterally. Venous sinuses: The dural sinuses are patent. Straight sinus and deep cerebral veins are intact. Cortical veins are within normal limits. Anatomic variants: None Delayed phase: The postcontrast images exaggerate subcortical white matter changes bilaterally. The left internal capsule infarct is still poorly seen. No pathologic enhancement is present. Review of the MIP images confirms the above findings IMPRESSION: 1. No large vessel occlusion. 2. No significant proximal stenosis account for the patient's infarct. 3. 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 This can be followed with MRA or CTA on an annual basis. 4. Multinodular goiter. 5. Moderate diffuse white matter disease likely reflects the sequela of chronic microvascular ischemia. Electronically Signed   By: San Morelle M.D.   On: 10/29/2017 13:37   Mr Brain Wo Contrast  Result Date: 10/29/2017 CLINICAL DATA:  Hemiplegia EXAM: MRI HEAD WITHOUT CONTRAST TECHNIQUE: Multiplanar, multiecho pulse sequences of the brain and surrounding structures were obtained without intravenous contrast. COMPARISON:  CT head 10/29/2017 FINDINGS: Brain: Acute infarct left posterior lenticulostriate. This involves the posterior external capsule fibers. No other acute infarct. Ventricle size normal. Moderate chronic microvascular ischemic change in the white matter. Chronic microhemorrhage in the left frontal periventricular white matter. Negative for mass or edema. Vascular: Normal arterial flow voids Skull and upper cervical spine: Negative Sinuses/Orbits: Paranasal sinuses clear. Bilateral cataract surgery. Other: None IMPRESSION: Acute infarct  left external capsule posteriorly. Moderate chronic microvascular ischemic change. Chronic microhemorrhage left frontal white matter adjacent to the head of caudate. Electronically Signed   By: Franchot Gallo M.D.   On: 10/29/2017 08:53   Ct Head Code Stroke Wo Contrast`  Result Date: 10/29/2017 CLINICAL DATA:  Code stroke. Sudden onset difficulty talking and weakness. EXAM: CT HEAD WITHOUT CONTRAST TECHNIQUE: Contiguous axial images were obtained from the base of the skull through the vertex without  intravenous contrast. COMPARISON:  05/20/2017 CT head. FINDINGS: Brain: No evidence of acute infarction, hemorrhage, hydrocephalus, extra-axial collection or mass lesion/mass effect. Stable chronic microvascular ischemic changes and volume loss of the brain. Vascular: Calcific atherosclerosis of carotid siphons. No hyperdense vessel identified. Skull: Normal. Negative for fracture or focal lesion. Sinuses/Orbits: No acute finding. Bilateral intra-ocular lens replacement. Other: None. ASPECTS [facility] Stroke Program Early CT Score) - Ganglionic level infarction (caudate, lentiform nuclei, internal capsule, insula, M1-M3 cortex): 7 - Supraganglionic infarction (M4-M6 cortex): 3 Total score (0-10 with 10 being normal): 10 IMPRESSION: 1. No acute intracranial abnormality identified. 2. Stable chronic microvascular ischemic changes and volume loss of the brain. 3. ASPECTS is 10 These results were called by telephone at the time of interpretation on 10/29/2017 at 4:35 am to Dr. Veryl Speak , who verbally acknowledged these results. Electronically Signed   By: Kristine Garbe M.D.   On: 10/29/2017 04:39    Radiological Exams on Admission: Ct Angio Head W Or Wo Contrast  Result Date: 10/29/2017 CLINICAL DATA:  Stroke, wall of. Weakness. Acute infarct of the left external capsule EXAM: CT ANGIOGRAPHY HEAD AND NECK TECHNIQUE: Multidetector CT imaging of the head and neck was performed using the standard  protocol during bolus administration of intravenous contrast. Multiplanar CT image reconstructions and MIPs were obtained to evaluate the vascular anatomy. Carotid stenosis measurements (when applicable) are obtained utilizing NASCET criteria, using the distal internal carotid diameter as the denominator. CONTRAST:  37mL ISOVUE-370 IOPAMIDOL (ISOVUE-370) INJECTION 76% COMPARISON:  MRI brain 10/29/2017. CT head without contrast 10/29/2017. FINDINGS: CTA NECK FINDINGS Aortic arch: There is a common origin of the left common carotid artery and the innominate artery. No significant stenosis is present. Atherosclerotic changes are present. There is no aneurysm. Right carotid system: The right common carotid artery is within normal limits. Bifurcation is unremarkable. The cervical right ICA is tortuous without significant stenosis. Minimal atherosclerotic calcifications Left carotid system: The left common carotid artery is within normal limits. The bifurcation is within normal limits. There is mild tortuosity of the cervical left ICA without significant stenosis. Vertebral arteries: The left vertebral artery is dominant. Both vertebral arteries originate from the subclavian arteries without significant stenosis. There is no focal stenosis or injury to either vascular vertebral artery in the neck. Skeleton: Extensive anterior cervical spine fusion is present C3-6. Vertebral body heights are maintained. No focal lytic or blastic lesions are present. Other neck: A multinodular goiter extends into the upper mediastinum. There is no dominant lesion. Salivary glands are within normal limits. No significant adenopathy is present. Upper chest: The lung apices demonstrate mild ground-glass attenuation without focal airspace consolidation. No nodule or mass lesion is present. Review of the MIP images confirms the above findings CTA HEAD FINDINGS Anterior circulation: Minimal calcifications are present within the cavernous internal  carotid arteries. There is no significant stenosis from the skull base through the ICA terminus bilaterally. The anterior communicating artery is patent. A1 and M1 segments are normal. The ACA and MCA branch vessels are within normal limits. MCA bifurcations are unremarkable. Posterior circulation: The left vertebral artery is dominant vessel. PICA origin is visualized and normal. Vertebrobasilar junction is. Both posterior cerebral arteries originate from the basilar tip. 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 PCA branch vessels are within normal limits bilaterally. Venous sinuses: The dural sinuses are patent. Straight sinus and deep cerebral veins are intact. Cortical veins are within normal limits. Anatomic variants: None Delayed phase: The  postcontrast images exaggerate subcortical white matter changes bilaterally. The left internal capsule infarct is still poorly seen. No pathologic enhancement is present. Review of the MIP images confirms the above findings IMPRESSION: 1. No large vessel occlusion. 2. No significant proximal stenosis account for the patient's infarct. 3. 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 This can be followed with MRA or CTA on an annual basis. 4. Multinodular goiter. 5. Moderate diffuse white matter disease likely reflects the sequela of chronic microvascular ischemia. Electronically Signed   By: San Morelle M.D.   On: 10/29/2017 13:37   Ct Angio Neck W Or Wo Contrast  Result Date: 10/29/2017 CLINICAL DATA:  Stroke, wall of. Weakness. Acute infarct of the left external capsule EXAM: CT ANGIOGRAPHY HEAD AND NECK TECHNIQUE: Multidetector CT imaging of the head and neck was performed using the standard protocol during bolus administration of intravenous contrast. Multiplanar CT image reconstructions and MIPs were obtained to evaluate the vascular anatomy. Carotid stenosis measurements (when applicable) are obtained utilizing NASCET  criteria, using the distal internal carotid diameter as the denominator. CONTRAST:  97mL ISOVUE-370 IOPAMIDOL (ISOVUE-370) INJECTION 76% COMPARISON:  MRI brain 10/29/2017. CT head without contrast 10/29/2017. FINDINGS: CTA NECK FINDINGS Aortic arch: There is a common origin of the left common carotid artery and the innominate artery. No significant stenosis is present. Atherosclerotic changes are present. There is no aneurysm. Right carotid system: The right common carotid artery is within normal limits. Bifurcation is unremarkable. The cervical right ICA is tortuous without significant stenosis. Minimal atherosclerotic calcifications Left carotid system: The left common carotid artery is within normal limits. The bifurcation is within normal limits. There is mild tortuosity of the cervical left ICA without significant stenosis. Vertebral arteries: The left vertebral artery is dominant. Both vertebral arteries originate from the subclavian arteries without significant stenosis. There is no focal stenosis or injury to either vascular vertebral artery in the neck. Skeleton: Extensive anterior cervical spine fusion is present C3-6. Vertebral body heights are maintained. No focal lytic or blastic lesions are present. Other neck: A multinodular goiter extends into the upper mediastinum. There is no dominant lesion. Salivary glands are within normal limits. No significant adenopathy is present. Upper chest: The lung apices demonstrate mild ground-glass attenuation without focal airspace consolidation. No nodule or mass lesion is present. Review of the MIP images confirms the above findings CTA HEAD FINDINGS Anterior circulation: Minimal calcifications are present within the cavernous internal carotid arteries. There is no significant stenosis from the skull base through the ICA terminus bilaterally. The anterior communicating artery is patent. A1 and M1 segments are normal. The ACA and MCA branch vessels are within normal  limits. MCA bifurcations are unremarkable. Posterior circulation: The left vertebral artery is dominant vessel. PICA origin is visualized and normal. Vertebrobasilar junction is. Both posterior cerebral arteries originate from the basilar tip. 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 PCA branch vessels are within normal limits bilaterally. Venous sinuses: The dural sinuses are patent. Straight sinus and deep cerebral veins are intact. Cortical veins are within normal limits. Anatomic variants: None Delayed phase: The postcontrast images exaggerate subcortical white matter changes bilaterally. The left internal capsule infarct is still poorly seen. No pathologic enhancement is present. Review of the MIP images confirms the above findings IMPRESSION: 1. No large vessel occlusion. 2. No significant proximal stenosis account for the patient's infarct. 3. 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 This can be followed with MRA  or CTA on an annual basis. 4. Multinodular goiter. 5. Moderate diffuse white matter disease likely reflects the sequela of chronic microvascular ischemia. Electronically Signed   By: San Morelle M.D.   On: 10/29/2017 13:37   Mr Brain Wo Contrast  Result Date: 10/29/2017 CLINICAL DATA:  Hemiplegia EXAM: MRI HEAD WITHOUT CONTRAST TECHNIQUE: Multiplanar, multiecho pulse sequences of the brain and surrounding structures were obtained without intravenous contrast. COMPARISON:  CT head 10/29/2017 FINDINGS: Brain: Acute infarct left posterior lenticulostriate. This involves the posterior external capsule fibers. No other acute infarct. Ventricle size normal. Moderate chronic microvascular ischemic change in the white matter. Chronic microhemorrhage in the left frontal periventricular white matter. Negative for mass or edema. Vascular: Normal arterial flow voids Skull and upper cervical spine: Negative Sinuses/Orbits: Paranasal sinuses clear. Bilateral  cataract surgery. Other: None IMPRESSION: Acute infarct left external capsule posteriorly. Moderate chronic microvascular ischemic change. Chronic microhemorrhage left frontal white matter adjacent to the head of caudate. Electronically Signed   By: Franchot Gallo M.D.   On: 10/29/2017 08:53   Ct Head Code Stroke Wo Contrast`  Result Date: 10/29/2017 CLINICAL DATA:  Code stroke. Sudden onset difficulty talking and weakness. EXAM: CT HEAD WITHOUT CONTRAST TECHNIQUE: Contiguous axial images were obtained from the base of the skull through the vertex without intravenous contrast. COMPARISON:  05/20/2017 CT head. FINDINGS: Brain: No evidence of acute infarction, hemorrhage, hydrocephalus, extra-axial collection or mass lesion/mass effect. Stable chronic microvascular ischemic changes and volume loss of the brain. Vascular: Calcific atherosclerosis of carotid siphons. No hyperdense vessel identified. Skull: Normal. Negative for fracture or focal lesion. Sinuses/Orbits: No acute finding. Bilateral intra-ocular lens replacement. Other: None. ASPECTS [facility] Stroke Program Early CT Score) - Ganglionic level infarction (caudate, lentiform nuclei, internal capsule, insula, M1-M3 cortex): 7 - Supraganglionic infarction (M4-M6 cortex): 3 Total score (0-10 with 10 being normal): 10 IMPRESSION: 1. No acute intracranial abnormality identified. 2. Stable chronic microvascular ischemic changes and volume loss of the brain. 3. ASPECTS is 10 These results were called by telephone at the time of interpretation on 10/29/2017 at 4:35 am to Dr. Veryl Speak , who verbally acknowledged these results. Electronically Signed   By: Kristine Garbe M.D.   On: 10/29/2017 04:39    DVT Prophylaxis -SCD /heparin AM Labs Ordered, also please review Full Orders  Family Communication: Admission, patients condition and plan of care including tests being ordered have been discussed with the patient  who indicate understanding and  agree with the plan   Code Status - Full Code  Likely DC to  SNF  Condition   stable Roxan Hockey M.D on 10/29/2017 at 6:18 PM Pager---[phone] Go to [email or web address] - [email or web address] for contact info  [facility] - Office  [phone]

## 2017-10-29 NOTE — ED Notes (Signed)
ED  STAFF, LAB, CT NOTIFIED @ 0410. RECEIVED CALL FROM [facility]

## 2017-10-29 NOTE — ED Provider Notes (Signed)
[facility] EMERGENCY DEPARTMENT Provider Note   CSN: [id number] Arrival date & time: 10/29/17  0422   An emergency department physician performed an initial assessment on this suspected stroke patient at 0425.  History   Chief Complaint Chief Complaint  Patient presents with  . Code Stroke    HPI Holly Flores is a 72 y.o. female.  Patient is a 72 year old female with past medical history of CHF, COPD, hypertension, coronary artery disease.  She presents today for evaluation of weakness in her right arm and right leg.  She states that she was in her normal state of health, however when she woke up at 3:30 AM she was having difficulty moving her right arm and right leg.  911 was called and the patient was brought here as a code stroke.  She denies any headache.  She denies any injury or trauma.  She is currently taking Eliquis.  The history is provided by the patient.    Past Medical History:  Diagnosis Date  . Anxiety   . Arthritis   . CHF (congestive heart failure) ([place])   . COPD (chronic obstructive pulmonary disease) ([place])   . Depression   . GERD (gastroesophageal reflux disease)   . Goiter   . Hyperlipidemia   . Hypertension   . Hypothyroidism   . Kidney cysts    left side  . Myocardial infarction ([place])    8 yrs ago.   Marland Kitchen Shortness of breath   . Sleep apnea    uses CPAP, 3  . Stroke ([place])   . Stroke ([place])    OCULAR  LEFT  EYE    LAST YR.    Patient Active Problem List   Diagnosis Date Noted  . Taking medication for chronic disease 05/18/2017  . CRVO (central retinal vein occlusion), left 01/22/2015  . Degenerative disc disease, cervical 01/22/2015  . HA (headache) 12/30/2014  . CVA (cerebral vascular accident) ([place]) 12/30/2014  . CVA (cerebral infarction) 12/30/2014  . History of colonic polyps 05/14/2014  . 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 Chest pain 04/20/2013  . Dyspnea 04/20/2013  . Hyperlipidemia 04/20/2013    Past  Surgical History:  Procedure Laterality Date  . ABDOMINAL HYSTERECTOMY    . BACK SURGERY     x3,cervical and lunmbar, disc.  Marland Kitchen BREAST BIOPSY     left-non cancerous  . CARDIAC CATHETERIZATION    . CARPAL TUNNEL RELEASE Left   . CATARACT EXTRACTION W/PHACO  09/21/2011   Procedure: CATARACT EXTRACTION PHACO AND INTRAOCULAR LENS PLACEMENT (IOC);  Surgeon: Williams Che, MD;  Location: [facility];  Service: Ophthalmology;  Laterality: Left;  CDE:9.78  . CATARACT EXTRACTION W/PHACO Right 10/15/2014   Procedure: CATARACT EXTRACTION PHACO AND INTRAOCULAR LENS PLACEMENT ([place]);  Surgeon: Williams Che, MD;  Location: [facility];  Service: Ophthalmology;  Laterality: Right;  CDE:4.19  . CERVICAL SPINE SURGERY    . CESAREAN SECTION     x2  . CHOLECYSTECTOMY    . COLONOSCOPY  2006   RMR: 1. Internal hemorroids, otherwise normal rectum. 2. Pedunculated polyp at 35 cm. reomved with snare. The remainder of teh colonic mucosa appeared normal.   . COLONOSCOPY  2011   Dr. Gala Romney: multiple ascending colon polyps and rectal polyp, adenomatous  . COLONOSCOPY N/A 05/31/2014   Procedure: COLONOSCOPY;  Surgeon: Daneil Dolin, MD;  Location: [facility];  Service: Endoscopy;  Laterality: N/A;  130  . COLONOSCOPY WITH PROPOFOL N/A 06/24/2017  Procedure: COLONOSCOPY WITH PROPOFOL;  Surgeon: Daneil Dolin, MD;  Location: [facility];  Service: Endoscopy;  Laterality: N/A;  2:15pm  . ESOPHAGOGASTRODUODENOSCOPY  2006   Dr. Gala Romney: Subtle Schatzkis ring, otherwise normal upper GI tract, aside from a small pyloric channell erosion, status post dilation as described above.   Marland Kitchen KNEE SURGERY Bilateral   . POLYPECTOMY  06/24/2017   Procedure: POLYPECTOMY;  Surgeon: Daneil Dolin, MD;  Location: [facility];  Service: Endoscopy;;  ascending  . TONSILLECTOMY    . TOTAL KNEE ARTHROPLASTY Right 05/24/2013   DR MURPHY  . TOTAL KNEE ARTHROPLASTY Right 05/24/2013   Procedure: TOTAL KNEE ARTHROPLASTY;  Surgeon: [name],  MD;  Location: [facility];  Service: Orthopedics;  Laterality: Right;     OB History   None      Home Medications    Prior to Admission medications   Medication Sig Start Date End Date Taking? Authorizing Provider  albuterol (PROVENTIL HFA;VENTOLIN HFA) 108 (90 Base) MCG/ACT inhaler Inhale 2 puffs into the lungs every 6 (six) hours as needed (for wheezing/shortness of breath).     [provider]  ALPRAZolam Duanne Moron) 1 MG tablet Take 1 mg by mouth at bedtime.    [provider]  apixaban (ELIQUIS) 5 MG TABS tablet Take 1 tablet (5 mg total) by mouth 2 (two) times daily. 09/06/17   Arnoldo Lenis, MD  esomeprazole (NEXIUM) 40 MG capsule Take 40 mg by mouth daily as needed (heartburn). For heartburn    [provider]  levothyroxine (LEVOTHROID) 50 MCG tablet Take 50 mcg by mouth daily before breakfast.    [provider]  Omega-3 Fatty Acids (FISH OIL) 500 MG CAPS Take 500 mg by mouth daily.    [provider]  OVER THE COUNTER MEDICATION Apply 1 application topically 3 (three) times daily as needed (for knee pain.). TWO OLD GOATS ESSENTIAL LOTION FOR ACHES & PAINS    [provider]  oxyCODONE-acetaminophen (ENDOCET) 10-325 MG tablet Take 1 tablet by mouth every 4 (four) hours as needed for pain.    [provider]  Polyethyl Glycol-Propyl Glycol (LUBRICANT EYE DROPS) 0.4-0.3 % SOLN Place 1 drop into both eyes 3 (three) times daily as needed (for dry eyes).    [provider]  potassium chloride SA (K-DUR,KLOR-CON) 20 MEQ tablet Take 20 mEq by mouth daily.    [provider]  Pyridoxine HCl (VITAMIN B-6 PO) Take 1 tablet by mouth daily.    [provider]  simvastatin (ZOCOR) 10 MG tablet Take 10 mg by mouth at bedtime.     [provider]  topiramate (TOPAMAX) 100 MG tablet Take 100 mg by mouth 2 (two) times daily.    [provider]  torsemide (DEMADEX) 20 MG tablet Take 40 mg by mouth  daily.    [provider]  Vitamin D, Ergocalciferol, (DRISDOL) 50000 UNITS CAPS Take 50,000 Units by mouth 2 (two) times a week. On Tuesdays and Thursdays    [provider]    Family History Family History  Problem Relation Age of Onset  . Cancer Other   . Diabetes Other   . Heart disease Other   . Hypertension Other   . Asthma Other   . Alcoholism Other   . Thyroid disease Other   . Rheumatologic disease Other   . Stroke Other   . Hypercholesterolemia Other   . Colon cancer Neg Hx     Social History  Social History   Tobacco Use  . Smoking status: Former Smoker    Packs/day: 0.50    Years: 10.00    Pack years: 5.00    Types: Cigarettes    Last attempt to quit: 04/21/1987    Years since quitting: 30.5  . Smokeless tobacco: Never Used  . Tobacco comment: smokes cigarette if she has a headache which helps.  Substance Use Topics  . Alcohol use: Not Currently    Comment: previously 2 glasses of wine a day  . Drug use: No     Allergies   Penicillins and Sulfa antibiotics   Review of Systems Review of Systems  All other systems reviewed and are negative.    Physical Exam Updated Vital Signs BP (!) 132/92 (BP Location: Right Arm)   Pulse 71   Temp 97.9 F (36.6 C) (Oral)   Resp 19   Ht 5\' 1"  (1.549 m)   Wt 95.3 kg   SpO2 96%   BMI 39.68 kg/m   Physical Exam  Constitutional: She is oriented to person, place, and time. She appears well-developed and well-nourished. No distress.  HENT:  Head: Normocephalic and atraumatic.  Eyes: Pupils are equal, round, and reactive to light. EOM are normal.  Neck: Normal range of motion. Neck supple.  Cardiovascular: Normal rate and regular rhythm. Exam reveals no gallop and no friction rub.  No murmur heard. Pulmonary/Chest: Effort normal and breath sounds normal. No respiratory distress. She has no wheezes.  Abdominal: Soft. Bowel sounds are normal. She exhibits no distension. There is no tenderness.    Musculoskeletal: Normal range of motion.  Neurological: She is alert and oriented to person, place, and time. No cranial nerve deficit. She exhibits abnormal muscle tone. Coordination normal.  Strength is 5 out of 5 in the left upper and left lower extremity.  Strength is 4 out of 5 in the right upper and right lower extremity.  Skin: Skin is warm and dry. She is not diaphoretic.  Nursing note and vitals reviewed.    ED Treatments / Results  Labs (all labs ordered are listed, but only abnormal results are displayed) Labs Reviewed  PROTIME-INR - Abnormal; Notable for the following components:      Result Value   Prothrombin Time 15.4 (*)    All other components within normal limits  CBC - Abnormal; Notable for the following components:   WBC 11.3 (*)    All other components within normal limits  DIFFERENTIAL - Abnormal; Notable for the following components:   Lymphs Abs 5.4 (*)    All other components within normal limits  COMPREHENSIVE METABOLIC PANEL - Abnormal; Notable for the following components:   Potassium 3.4 (*)    Glucose, Bld 115 (*)    Creatinine, Ser 1.28 (*)    Calcium 8.7 (*)    AST 13 (*)    GFR calc non Af Amer 41 (*)    GFR calc Af Amer 48 (*)    All other components within normal limits  ETHANOL  APTT  GLUCOSE, CAPILLARY  RAPID URINE DRUG SCREEN, HOSP PERFORMED  URINALYSIS, ROUTINE W REFLEX MICROSCOPIC  I-STAT CHEM 8, ED  I-STAT TROPONIN, ED    EKG EKG Interpretation  Date/Time:  Friday October 29 2017 04:39:20 EDT Ventricular Rate:  77 PR Interval:    QRS Duration: 96 QT Interval:  401 QTC Calculation: 454 R Axis:   -42 Text Interpretation:  Sinus rhythm Prolonged PR interval Left anterior fascicular block Borderline T abnormalities,  anterior leads Baseline wander in lead(s) V3 Confirmed by Veryl Speak [phone]) on 10/29/2017 4:50:46 AM   Radiology Ct Head Code Stroke Wo Contrast`  Result Date: 10/29/2017 CLINICAL DATA:  Code stroke.  Sudden onset difficulty talking and weakness. EXAM: CT HEAD WITHOUT CONTRAST TECHNIQUE: Contiguous axial images were obtained from the base of the skull through the vertex without intravenous contrast. COMPARISON:  05/20/2017 CT head. FINDINGS: Brain: No evidence of acute infarction, hemorrhage, hydrocephalus, extra-axial collection or mass lesion/mass effect. Stable chronic microvascular ischemic changes and volume loss of the brain. Vascular: Calcific atherosclerosis of carotid siphons. No hyperdense vessel identified. Skull: Normal. Negative for fracture or focal lesion. Sinuses/Orbits: No acute finding. Bilateral intra-ocular lens replacement. Other: None. ASPECTS [facility] Stroke Program Early CT Score) - Ganglionic level infarction (caudate, lentiform nuclei, internal capsule, insula, M1-M3 cortex): 7 - Supraganglionic infarction (M4-M6 cortex): 3 Total score (0-10 with 10 being normal): 10 IMPRESSION: 1. No acute intracranial abnormality identified. 2. Stable chronic microvascular ischemic changes and volume loss of the brain. 3. ASPECTS is 10 These results were called by telephone at the time of interpretation on 10/29/2017 at 4:35 am to Dr. Veryl Speak , who verbally acknowledged these results. Electronically Signed   By: Kristine Garbe M.D.   On: 10/29/2017 04:39    Procedures Procedures (including critical care time)  Medications Ordered in ED Medications - No data to display   Initial Impression / Assessment and Plan / ED Course  I have reviewed the triage vital signs and the nursing notes.  Pertinent labs & imaging results that were available during my care of the patient were reviewed by me and considered in my medical decision making (see chart for details).  Patient arrived here as a code stroke.  She was immediately evaluated by myself upon arrival.  Her airway was intact and she appeared stable for transport to radiology.  She underwent a head CT which was read as negative.   Laboratory studies are essentially unremarkable.  She underwent evaluation by tele-neurology.  It was their recommendation that no TPA was indicated due to multiple factors including NIH scale and anticoagulated state.  Recommendations were made for admission for stroke work-up.  I have spoken with Dr. Manuella Ghazi who agrees to admit.  Final Clinical Impressions(s) / ED Diagnoses   Final diagnoses:  None    ED Discharge Orders    None       Veryl Speak, MD 10/29/17 [phone]

## 2017-10-30 ENCOUNTER — Inpatient Hospital Stay (HOSPITAL_COMMUNITY): Payer: Medicare Other

## 2017-10-30 DIAGNOSIS — I5030 Unspecified diastolic (congestive) heart failure: Secondary | ICD-10-CM

## 2017-10-30 LAB — LIPID PANEL
CHOLESTEROL: 150 mg/dL (ref 0–200)
HDL: 33 mg/dL — ABNORMAL LOW (ref >40)
LDL Cholesterol: 91 mg/dL (ref 0–99)
TRIGLYCERIDES: 128 mg/dL (ref <150)
Total CHOL/HDL Ratio: 4.5 RATIO
VLDL: 26 mg/dL (ref 0–40)

## 2017-10-30 LAB — HEMOGLOBIN A1C
Hgb A1c MFr Bld: 6.1 % — ABNORMAL HIGH (ref 4.8–5.6)
MEAN PLASMA GLUCOSE: 128.37 mg/dL

## 2017-10-30 LAB — CBC
HCT: 39.7 % (ref 36.0–46.0)
Hemoglobin: 12.7 g/dL (ref 12.0–15.0)
MCH: 29.9 pg (ref 26.0–34.0)
MCHC: 32 g/dL (ref 30.0–36.0)
MCV: 93.4 fL (ref 78.0–100.0)
Platelets: 248 10*3/uL (ref 150–400)
RBC: 4.25 MIL/uL (ref 3.87–5.11)
RDW: 13.9 % (ref 11.5–15.5)
WBC: 9.1 10*3/uL (ref 4.0–10.5)

## 2017-10-30 LAB — BASIC METABOLIC PANEL
Anion gap: 7 (ref 5–15)
BUN: 17 mg/dL (ref 8–23)
CALCIUM: 8.7 mg/dL — AB (ref 8.9–10.3)
CO2: 24 mmol/L (ref 22–32)
Chloride: 109 mmol/L (ref 98–111)
Creatinine, Ser: 1.21 mg/dL — ABNORMAL HIGH (ref 0.44–1.00)
GFR calc Af Amer: 51 mL/min — ABNORMAL LOW (ref >60)
GFR, EST NON AFRICAN AMERICAN: 44 mL/min — AB (ref >60)
GLUCOSE: 107 mg/dL — AB (ref 70–99)
Potassium: 3.2 mmol/L — ABNORMAL LOW (ref 3.5–5.1)
Sodium: 140 mmol/L (ref 135–145)

## 2017-10-30 LAB — TSH: TSH: 1.882 u[IU]/mL (ref 0.350–4.500)

## 2017-10-30 LAB — ECHOCARDIOGRAM COMPLETE
HEIGHTINCHES: 61 in
WEIGHTICAEL: 3360 [oz_av]

## 2017-10-30 MED ORDER — POTASSIUM CHLORIDE CRYS ER 20 MEQ PO TBCR: 40.0000 meq | EXTENDED_RELEASE_TABLET | Freq: Once | ORAL | Status: DC

## 2017-10-30 MED ORDER — POTASSIUM CHLORIDE CRYS ER 20 MEQ PO TBCR
40.0000 meq | EXTENDED_RELEASE_TABLET | Freq: Once | ORAL | Status: AC
  Administered 2017-10-30: 40 meq via ORAL
  Filled 2017-10-30: qty 2

## 2017-10-30 NOTE — Progress Notes (Signed)
Patient admitted that she had not voided since 7am and felt like she needed to void but could not. Mid level paged and informed of above findings order given to in and out cath

## 2017-10-30 NOTE — Progress Notes (Signed)
Patient Demographics:    Holly Flores, is a 72 y.o. female, DOB - [birth year], OQH:[id number]  Admit date - 10/29/2017   Admitting Physician Rethel Sebek Denton Brick, MD  Outpatient Primary MD for the patient is Octavio Graves, DO  LOS - 1   Chief Complaint  Patient presents with  . Code Stroke        Subjective:    Holly Flores today has no fevers, no emesis,  No chest pain,  Daughter, granddaughter and great granddaughter at bedside, right-sided hemiplegia persist  Assessment  & Plan :    Principal Problem:   CVA (cerebral vascular accident) ([place]) Active Problems:   Hyperlipidemia   Central retinal vein occlusion of left eye   COPD (chronic obstructive pulmonary disease) (HCC)   Chronic diastolic CHF (congestive heart failure) (HCC)   Hypothyroidism   Essential hypertension   GERD (gastroesophageal reflux disease)   Anxiety  Brief Summary 72 year old female with past medical history relevant for hypertension, history of paroxysmal A. fib/atrial flutter who was started on Eliquis 3 weeks ago (09/06/17) admitted on 10/29/2017 with right-sided hemiplegia with MRI showing new acute stroke.  Plan:- 1)Acute CVA -right-sided hemiplegia persists,  MRI brain shows acute infarct of the left external capsule posteriorly, d/w neurologist  Dr. Jerral Bonito who recommends holding Eliquis in order to avoid hemorrhagic transformation, treat empirically with aspirin 325 mg daily for the next 3 days then may go back to Eliquis on 08/31/17, c/n Lipitor 80 mg daily,  echocardiogram is pending to rule out intracardiac thrombus  and to evaluate EF , CTA Neck /Head without  large Vessel occlusion or hemodynamically significant stenosis .     PT/OT eval appreciated, and recommended inpatient rehab speech eval requested, We will allow some permissive hypertension in view of possible acute stroke, avoid precipitous drop in blood  pressure. Use Hydralazine 10mg  iv every 4 hrs as needed for systolic blood pressure over 220 mmhg , TSH is 1.8, A1c is 6.1, and fasting lipid profile  LDL is 91, HDL is 33,   2)HTN- Allow some permissive Hypertension due to acute stroke, avoid very aggressive, rapid BP control at this time. Use Hydralazine 10 mg iv every 4 hrs as needed for systolic blood pressure over 220 mmhg ,   3)PAFib/Flutter -Eliquis on hold for now for the next 72 hours to avoid hemorrhagic transformation, continue aspirin 325 mg daily as advised by neurologist for now, restart Eliquis on 11/01/2017  Disposition/Need for in-Hospital Stay- patient unable to be discharged at this time due to Acute CVA-patient will need insurance authorization to go to inpatient rehab  Code Status : Full    Disposition Plan  : Awaiting insurance authorization to go to CIR  Consults  : Neurology/PT/OT  DVT Prophylaxis  :   Heparin   Lab Results  Component Value Date   PLT 248 10/30/2017    Inpatient Medications  Scheduled Meds: . ALPRAZolam  1 mg Oral QHS  . aspirin  325 mg Oral Daily  . atorvastatin  80 mg Oral Daily  . heparin  5,000 Units Subcutaneous Q8H  . Influenza vac split quadrivalent PF  0.5 mL Intramuscular Tomorrow-1000  . levothyroxine  50 mcg Oral QAC breakfast  . omega-3 acid ethyl esters  2 g  Oral Daily  . pantoprazole  40 mg Oral Daily  . potassium chloride SA  20 mEq Oral Daily  . potassium chloride  40 mEq Oral Once  . pyridOXINE  100 mg Oral Daily  . sodium chloride flush  3 mL Intravenous Q12H  . topiramate  100 mg Oral BID  . torsemide  20 mg Oral Daily  . [START ON 11/01/2017] Vitamin D (Ergocalciferol)  50,000 Units Oral Once per day on Mon Thu   Continuous Infusions: . sodium chloride     PRN Meds:.sodium chloride, acetaminophen **OR** acetaminophen, albuterol, hydrALAZINE, ondansetron **OR** ondansetron (ZOFRAN) IV, oxyCODONE-acetaminophen **AND** oxyCODONE, polyethylene glycol, polyvinyl  alcohol, sodium chloride flush, traZODone    Anti-infectives (From admission, onward)   None        Objective:   Vitals:   10/30/17 0000 10/30/17 0400 10/30/17 0800 10/30/17 1200  BP: 138/79 132/74 137/88 125/78  Pulse:  64 94 84  Resp: 18  18 18   Temp: 98.8 F (37.1 C) 98.4 F (36.9 C) 98.8 F (37.1 C) 98.4 F (36.9 C)  TempSrc: Oral Oral Oral Oral  SpO2: 98% 94% 94% 97%  Weight:      Height:        Wt Readings from Last 3 Encounters:  10/29/17 95.3 kg  09/06/17 99 kg  06/24/17 99.4 kg     Intake/Output Summary (Last 24 hours) at 10/30/2017 1452 Last data filed at 10/30/2017 1218 Gross per 24 hour  Intake 243 ml  Output 1300 ml  Net -1057 ml     Physical Exam Patient is examined daily including today on 10/30/17 , exams remain the same as of yesterday except that has changed   Gen:- Awake Alert,  In no apparent distress  HEENT:- [place].AT, No sclera icterus Neck-Supple Neck,No JVD,.  Lungs-  CTAB , good air movement bilaterally CV- S1, S2 normal Abd-  +ve B.Sounds, Abd Soft, No tenderness,    Extremity/Skin:- No  edema,   good pulses Psych-affect is appropriate, oriented x3 Neuro/ Neurologial Exam:- Neurological -right lower extremity is pretty weak with strength of 1/5, right upper extremities 2/5 extremities Mental Status: Patient is awake, alert, oriented to person, place, month, year, and situation. Patient is able to give a clear and coherent history. No signs of aphasia or neglect Cranial Nerves: II: Visual Fields are full. Pupils are equal, round, and reactive to light.  III,IV, VI: EOMI without ptosis or diploplia.  V: Facial sensation issymmetrical and wnl VII: Facial movement is symmetric.  VIII: hearing is intact to voice X: Uvula elevates symmetrically XI: Shoulder shrug is symmetric. XII: tongue is midline without atrophy or fasciculations.  Motor: -right lower extremity is pretty weak with strength of 1/5, right upper extremities 2/5  extremities Sensory: Sensation is dish in the right upper and right lower extremity to pinprick and to touch Cerebellar: FNFwithout ataxia    Data Review:   Micro Results No results found for this or any previous visit (from the past 240 hour(s)).  Radiology Reports Ct Angio Head W Or Wo Contrast  Result Date: 10/29/2017 CLINICAL DATA:  Stroke, wall of. Weakness. Acute infarct of the left external capsule EXAM: CT ANGIOGRAPHY HEAD AND NECK TECHNIQUE: Multidetector CT imaging of the head and neck was performed using the standard protocol during bolus administration of intravenous contrast. Multiplanar CT image reconstructions and MIPs were obtained to evaluate the vascular anatomy. Carotid stenosis measurements (when applicable) are obtained utilizing NASCET criteria, using the distal internal carotid diameter as the  denominator. CONTRAST:  59mL ISOVUE-370 IOPAMIDOL (ISOVUE-370) INJECTION 76% COMPARISON:  MRI brain 10/29/2017. CT head without contrast 10/29/2017. FINDINGS: CTA NECK FINDINGS Aortic arch: There is a common origin of the left common carotid artery and the innominate artery. No significant stenosis is present. Atherosclerotic changes are present. There is no aneurysm. Right carotid system: The right common carotid artery is within normal limits. Bifurcation is unremarkable. The cervical right ICA is tortuous without significant stenosis. Minimal atherosclerotic calcifications Left carotid system: The left common carotid artery is within normal limits. The bifurcation is within normal limits. There is mild tortuosity of the cervical left ICA without significant stenosis. Vertebral arteries: The left vertebral artery is dominant. Both vertebral arteries originate from the subclavian arteries without significant stenosis. There is no focal stenosis or injury to either vascular vertebral artery in the neck. Skeleton: Extensive anterior cervical spine fusion is present C3-6. Vertebral body  heights are maintained. No focal lytic or blastic lesions are present. Other neck: A multinodular goiter extends into the upper mediastinum. There is no dominant lesion. Salivary glands are within normal limits. No significant adenopathy is present. Upper chest: The lung apices demonstrate mild ground-glass attenuation without focal airspace consolidation. No nodule or mass lesion is present. Review of the MIP images confirms the above findings CTA HEAD FINDINGS Anterior circulation: Minimal calcifications are present within the cavernous internal carotid arteries. There is no significant stenosis from the skull base through the ICA terminus bilaterally. The anterior communicating artery is patent. A1 and M1 segments are normal. The ACA and MCA branch vessels are within normal limits. MCA bifurcations are unremarkable. Posterior circulation: The left vertebral artery is dominant vessel. PICA origin is visualized and normal. Vertebrobasilar junction is. Both posterior cerebral arteries originate from the basilar tip. 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 PCA branch vessels are within normal limits bilaterally. Venous sinuses: The dural sinuses are patent. Straight sinus and deep cerebral veins are intact. Cortical veins are within normal limits. Anatomic variants: None Delayed phase: The postcontrast images exaggerate subcortical white matter changes bilaterally. The left internal capsule infarct is still poorly seen. No pathologic enhancement is present. Review of the MIP images confirms the above findings IMPRESSION: 1. No large vessel occlusion. 2. No significant proximal stenosis account for the patient's infarct. 3. 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 This can be followed with MRA or CTA on an annual basis. 4. Multinodular goiter. 5. Moderate diffuse white matter disease likely reflects the sequela of chronic microvascular ischemia. Electronically Signed   By:  San Morelle M.D.   On: 10/29/2017 13:37   Ct Angio Neck W Or Wo Contrast  Result Date: 10/29/2017 CLINICAL DATA:  Stroke, wall of. Weakness. Acute infarct of the left external capsule EXAM: CT ANGIOGRAPHY HEAD AND NECK TECHNIQUE: Multidetector CT imaging of the head and neck was performed using the standard protocol during bolus administration of intravenous contrast. Multiplanar CT image reconstructions and MIPs were obtained to evaluate the vascular anatomy. Carotid stenosis measurements (when applicable) are obtained utilizing NASCET criteria, using the distal internal carotid diameter as the denominator. CONTRAST:  39mL ISOVUE-370 IOPAMIDOL (ISOVUE-370) INJECTION 76% COMPARISON:  MRI brain 10/29/2017. CT head without contrast 10/29/2017. FINDINGS: CTA NECK FINDINGS Aortic arch: There is a common origin of the left common carotid artery and the innominate artery. No significant stenosis is present. Atherosclerotic changes are present. There is no aneurysm. Right carotid system: The right common carotid artery is within  normal limits. Bifurcation is unremarkable. The cervical right ICA is tortuous without significant stenosis. Minimal atherosclerotic calcifications Left carotid system: The left common carotid artery is within normal limits. The bifurcation is within normal limits. There is mild tortuosity of the cervical left ICA without significant stenosis. Vertebral arteries: The left vertebral artery is dominant. Both vertebral arteries originate from the subclavian arteries without significant stenosis. There is no focal stenosis or injury to either vascular vertebral artery in the neck. Skeleton: Extensive anterior cervical spine fusion is present C3-6. Vertebral body heights are maintained. No focal lytic or blastic lesions are present. Other neck: A multinodular goiter extends into the upper mediastinum. There is no dominant lesion. Salivary glands are within normal limits. No significant  adenopathy is present. Upper chest: The lung apices demonstrate mild ground-glass attenuation without focal airspace consolidation. No nodule or mass lesion is present. Review of the MIP images confirms the above findings CTA HEAD FINDINGS Anterior circulation: Minimal calcifications are present within the cavernous internal carotid arteries. There is no significant stenosis from the skull base through the ICA terminus bilaterally. The anterior communicating artery is patent. A1 and M1 segments are normal. The ACA and MCA branch vessels are within normal limits. MCA bifurcations are unremarkable. Posterior circulation: The left vertebral artery is dominant vessel. PICA origin is visualized and normal. Vertebrobasilar junction is. Both posterior cerebral arteries originate from the basilar tip. 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 PCA branch vessels are within normal limits bilaterally. Venous sinuses: The dural sinuses are patent. Straight sinus and deep cerebral veins are intact. Cortical veins are within normal limits. Anatomic variants: None Delayed phase: The postcontrast images exaggerate subcortical white matter changes bilaterally. The left internal capsule infarct is still poorly seen. No pathologic enhancement is present. Review of the MIP images confirms the above findings IMPRESSION: 1. No large vessel occlusion. 2. No significant proximal stenosis account for the patient's infarct. 3. 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 This can be followed with MRA or CTA on an annual basis. 4. Multinodular goiter. 5. Moderate diffuse white matter disease likely reflects the sequela of chronic microvascular ischemia. Electronically Signed   By: San Morelle M.D.   On: 10/29/2017 13:37   Mr Brain Wo Contrast  Result Date: 10/29/2017 CLINICAL DATA:  Hemiplegia EXAM: MRI HEAD WITHOUT CONTRAST TECHNIQUE: Multiplanar, multiecho pulse sequences of the brain and  surrounding structures were obtained without intravenous contrast. COMPARISON:  CT head 10/29/2017 FINDINGS: Brain: Acute infarct left posterior lenticulostriate. This involves the posterior external capsule fibers. No other acute infarct. Ventricle size normal. Moderate chronic microvascular ischemic change in the white matter. Chronic microhemorrhage in the left frontal periventricular white matter. Negative for mass or edema. Vascular: Normal arterial flow voids Skull and upper cervical spine: Negative Sinuses/Orbits: Paranasal sinuses clear. Bilateral cataract surgery. Other: None IMPRESSION: Acute infarct left external capsule posteriorly. Moderate chronic microvascular ischemic change. Chronic microhemorrhage left frontal white matter adjacent to the head of caudate. Electronically Signed   By: Franchot Gallo M.D.   On: 10/29/2017 08:53   Ct Head Code Stroke Wo Contrast`  Result Date: 10/29/2017 CLINICAL DATA:  Code stroke. Sudden onset difficulty talking and weakness. EXAM: CT HEAD WITHOUT CONTRAST TECHNIQUE: Contiguous axial images were obtained from the base of the skull through the vertex without intravenous contrast. COMPARISON:  05/20/2017 CT head. FINDINGS: Brain: No evidence of acute infarction, hemorrhage, hydrocephalus, extra-axial collection or mass lesion/mass effect. Stable chronic microvascular  ischemic changes and volume loss of the brain. Vascular: Calcific atherosclerosis of carotid siphons. No hyperdense vessel identified. Skull: Normal. Negative for fracture or focal lesion. Sinuses/Orbits: No acute finding. Bilateral intra-ocular lens replacement. Other: None. ASPECTS [facility] Stroke Program Early CT Score) - Ganglionic level infarction (caudate, lentiform nuclei, internal capsule, insula, M1-M3 cortex): 7 - Supraganglionic infarction (M4-M6 cortex): 3 Total score (0-10 with 10 being normal): 10 IMPRESSION: 1. No acute intracranial abnormality identified. 2. Stable chronic microvascular  ischemic changes and volume loss of the brain. 3. ASPECTS is 10 These results were called by telephone at the time of interpretation on 10/29/2017 at 4:35 am to Dr. Veryl Speak , who verbally acknowledged these results. Electronically Signed   By: Kristine Garbe M.D.   On: 10/29/2017 04:39     CBC Recent Labs  Lab 10/29/17 0433 10/30/17 0612  WBC 11.3* 9.1  HGB 13.2 12.7  HCT 40.0 39.7  PLT 245 248  MCV 92.6 93.4  MCH 30.6 29.9  MCHC 33.0 32.0  RDW 13.8 13.9  LYMPHSABS 5.4*  --   MONOABS 0.8  --   EOSABS 0.5  --   BASOSABS 0.1  --     Chemistries  Recent Labs  Lab 10/29/17 0433 10/30/17 0612  NA 143 140  K 3.4* 3.2*  CL 110 109  CO2 25 24  GLUCOSE 115* 107*  BUN 15 17  CREATININE 1.28* 1.21*  CALCIUM 8.7* 8.7*  AST 13*  --   ALT 8  --   ALKPHOS 77  --   BILITOT 0.5  --    ------------------------------------------------------------------------------------------------------------------ Recent Labs    10/30/17 0612  CHOL 150  HDL 33*  LDLCALC 91  TRIG 128  CHOLHDL 4.5    Lab Results  Component Value Date   HGBA1C 6.1 (H) 10/30/2017   ------------------------------------------------------------------------------------------------------------------ Recent Labs    10/30/17 0612  TSH 1.882   ------------------------------------------------------------------------------------------------------------------ No results for input(s): VITAMINB12, FOLATE, FERRITIN, TIBC, IRON, RETICCTPCT in the last 72 hours.  Coagulation profile Recent Labs  Lab 10/29/17 0433  INR 1.23    No results for input(s): DDIMER in the last 72 hours.  Cardiac Enzymes Recent Labs  Lab 10/29/17 0539  TROPONINI <0.03   ------------------------------------------------------------------------------------------------------------------    Component Value Date/Time   BNP 38.0 01/16/2015 1219     Henri Baumler M.D on 10/30/2017 at 2:52 PM  Pager---[phone] Go  to [email or web address] - password [email or web address] for contact info  [facility] - Office  [phone]

## 2017-10-30 NOTE — Progress Notes (Signed)
*  PRELIMINARY RESULTS* Echocardiogram 2D Echocardiogram has been performed.  Holly Flores 10/30/2017, 11:32 AM

## 2017-10-30 NOTE — Progress Notes (Signed)
Physical Therapy Treatment Patient Details Name: Holly Flores MRN: [id number] DOB: [birth year] Today's Date: 10/30/2017    History of Present Illness Patient is a 72 year old female with past medical history of CHF, COPD, hypertension, coronary artery disease.  She presents today for evaluation of weakness in her right arm and right leg.  She states that she was in her normal state of health, however when she woke up at 3:30 AM she was having difficulty moving her right arm and right leg.  911 was called and the patient was brought here as a code stroke.  She denies any headache.  She denies any injury or trauma.  She is currently taking Eliquis.    PT Comments    Patient was found awake and alert in bed. Patient required Max A to perform bed mobility with assistance provided at the right upper and lower extremity. Patient was able to demonstrate fair sitting balance with support. With nursing tech in room for any additional assistance required patient performed sit to stand max A, therapist blocking patient's right knee. Patient sat again and then performed sit to stand again, instructions provided each attempt to push up from bed. Then patient took 3-4 shuffling steps to the left with therapist providing cueing on weight shift and max assistance for weight shift. Patient was returned to bed with nursing staff in room. Bed alarm set. Patient would continue to benefit from skilled physical therapy in order to improve patient's safety and independence with transfers, bed mobility, gait, and overall functional mobility.     Follow Up Recommendations  SNF     Equipment Recommendations  None recommended by PT    Recommendations for Other Services       Precautions / Restrictions Precautions Precautions: Fall Restrictions Weight Bearing Restrictions: No    Mobility  Bed Mobility Overal bed mobility: Needs Assistance Bed Mobility: Supine to Sit;Sit to Supine     Supine to sit: Max  assist Sit to supine: Max assist   General bed mobility comments: slow labored movement  Transfers Overall transfer level: Needs assistance Equipment used: Hemi-walker Transfers: Sit to/from Stand Sit to Stand: Max assist         General transfer comment: Used HW this session, was able to grip in left hand  Ambulation/Gait Ambulation/Gait assistance: Max assist Gait Distance (Feet): 2 Feet Assistive device: Hemi-walker Gait Pattern/deviations: Decreased step length - right;Decreased stance time - right;Decreased stride length Gait velocity: slow   General Gait Details: limited to 3-4 shuffling side steps with Max tactile assist to move RLE at bedside, unsafe to attempt taking steps away from bedside. Assistance provided by therapist, nursing tech, stand by assist.    Stairs             Wheelchair Mobility    Modified Rankin (Stroke Patients Only)       Balance Overall balance assessment: Needs assistance Sitting-balance support: Feet supported;No upper extremity supported Sitting balance-Leahy Scale: Fair     Standing balance support: During functional activity;Single extremity supported Standing balance-Leahy Scale: Poor Standing balance comment: requires Max hand held assist to stand                            Cognition Arousal/Alertness: Awake/alert Behavior During Therapy: WFL for tasks assessed/performed Overall Cognitive Status: Within Functional Limits for tasks assessed  Exercises      General Comments        Pertinent Vitals/Pain Pain Assessment: 0-10 Pain Score: 2  Pain Location: Right side of face Pain Descriptors / Indicators: Numbness Pain Intervention(s): Limited activity within patient's tolerance;Monitored during session    Home Living                      Prior Function            PT Goals (current goals can now be found in the care plan section)  Acute Rehab PT Goals Patient Stated Goal: return home after rehab PT Goal Formulation: With patient/family Time For Goal Achievement: 11/12/17 Potential to Achieve Goals: Fair Progress towards PT goals: Progressing toward goals    Frequency    7X/week      PT Plan Current plan remains appropriate    Co-evaluation              AM-PAC PT "6 Clicks" Daily Activity  Outcome Measure  Difficulty turning over in bed (including adjusting bedclothes, sheets and blankets)?: A Lot Difficulty moving from lying on back to sitting on the side of the bed? : A Lot Difficulty sitting down on and standing up from a chair with arms (e.g., wheelchair, bedside commode, etc,.)?: A Lot Help needed moving to and from a bed to chair (including a wheelchair)?: Total Help needed walking in hospital room?: Total Help needed climbing 3-5 steps with a railing? : Total 6 Click Score: 9    End of Session Equipment Utilized During Treatment: Gait belt Activity Tolerance: Patient tolerated treatment well;Patient limited by fatigue Patient left: in bed;with call bell/phone within reach;with nursing/sitter in room Nurse Communication: Mobility status PT Visit Diagnosis: Other abnormalities of gait and mobility (R26.89);Unsteadiness on feet (R26.81);Muscle weakness (generalized) (M62.81)     Time: 1423-9532 PT Time Calculation (min) (ACUTE ONLY): 23 min  Charges:  $Therapeutic Activity: 23-37 mins                    Clarene Critchley PT, DPT 9:49 AM, 10/30/17 [phone]

## 2017-10-31 LAB — BASIC METABOLIC PANEL
Anion gap: 7 (ref 5–15)
BUN: 16 mg/dL (ref 8–23)
CHLORIDE: 111 mmol/L (ref 98–111)
CO2: 23 mmol/L (ref 22–32)
CREATININE: 1.28 mg/dL — AB (ref 0.44–1.00)
Calcium: 8.7 mg/dL — ABNORMAL LOW (ref 8.9–10.3)
GFR calc Af Amer: 48 mL/min — ABNORMAL LOW (ref >60)
GFR calc non Af Amer: 41 mL/min — ABNORMAL LOW (ref >60)
Glucose, Bld: 106 mg/dL — ABNORMAL HIGH (ref 70–99)
POTASSIUM: 3.6 mmol/L (ref 3.5–5.1)
Sodium: 141 mmol/L (ref 135–145)

## 2017-10-31 NOTE — Plan of Care (Signed)
  Problem: SLP Dysphagia Goals Goal: Patient will utilize recommended strategies Description Patient will utilize recommended strategies during swallow to increase swallowing safety with Flowsheets (Taken 10/31/2017 1008) Patient will utilize recommended strategies during swallow to increase swallowing safety with : min assist   Problem: SLP Language Goals Goal: Patient will utilize speech intelligibility Description Patient will utilize speech intelligibility strategies to  enhance communication with Flowsheets (Taken 10/31/2017 1008) Patient will utilize speech intelligibility strategies to enhance communication with ____: min assist Goal: Patient will communicate needs/wants with Flowsheets (Taken 10/31/2017 1008) Patient will communicate ____  needs/wants with: modified independence  Thank you,  Genene Churn, CCC-SLP [phone]

## 2017-10-31 NOTE — Evaluation (Signed)
Speech Language Pathology Evaluation Patient Details Name: Holly Flores MRN: [id number] DOB: [birth year] Today's Date: 10/31/2017 Time: 0623-7628 SLP Time Calculation (min) (ACUTE ONLY): 19 min  Problem List:  Patient Active Problem List   Diagnosis Date Noted  . COPD (chronic obstructive pulmonary disease) ([place]) 10/29/2017  . Chronic diastolic CHF (congestive heart failure) ([place]) 10/29/2017  . Hypothyroidism 10/29/2017  . Essential hypertension 10/29/2017  . GERD (gastroesophageal reflux disease) 10/29/2017  . Anxiety 10/29/2017  . Taking medication for chronic disease 05/18/2017  . Central retinal vein occlusion of left eye 01/22/2015  . Degenerative disc disease, cervical 01/22/2015  . HA (headache) 12/30/2014  . CVA (cerebral vascular accident) ([place]) 12/30/2014  . CVA (cerebral infarction) 12/30/2014  . History of colonic polyps 05/14/2014  . 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 Chest pain 04/20/2013  . Dyspnea 04/20/2013  . Hyperlipidemia 04/20/2013   Past Medical History:  Past Medical History:  Diagnosis Date  . Anxiety   . Arthritis   . CHF (congestive heart failure) ([place])   . COPD (chronic obstructive pulmonary disease) ([place])   . Depression   . GERD (gastroesophageal reflux disease)   . Goiter   . Hyperlipidemia   . Hypertension   . Hypothyroidism   . Kidney cysts    left side  . Myocardial infarction ([place])    8 yrs ago.   Marland Kitchen Shortness of breath   . Sleep apnea    uses CPAP, 3  . Stroke ([place])   . Stroke ([place])    OCULAR  LEFT  EYE    LAST YR.   Past Surgical History:  Past Surgical History:  Procedure Laterality Date  . ABDOMINAL HYSTERECTOMY    . BACK SURGERY     x3,cervical and lunmbar, disc.  Marland Kitchen BREAST BIOPSY     left-non cancerous  . CARDIAC CATHETERIZATION    . CARPAL TUNNEL RELEASE Left   . CATARACT EXTRACTION W/PHACO  09/21/2011   Procedure: CATARACT EXTRACTION PHACO AND INTRAOCULAR LENS PLACEMENT (IOC);  Surgeon: Williams Che, MD;  Location: [facility];  Service: Ophthalmology;  Laterality: Left;  CDE:9.78  . CATARACT EXTRACTION W/PHACO Right 10/15/2014   Procedure: CATARACT EXTRACTION PHACO AND INTRAOCULAR LENS PLACEMENT ([place]);  Surgeon: Williams Che, MD;  Location: [facility];  Service: Ophthalmology;  Laterality: Right;  CDE:4.19  . CERVICAL SPINE SURGERY    . CESAREAN SECTION     x2  . CHOLECYSTECTOMY    . COLONOSCOPY  2006   RMR: 1. Internal hemorroids, otherwise normal rectum. 2. Pedunculated polyp at 35 cm. reomved with snare. The remainder of teh colonic mucosa appeared normal.   . COLONOSCOPY  2011   Dr. Gala Romney: multiple ascending colon polyps and rectal polyp, adenomatous  . COLONOSCOPY N/A 05/31/2014   Procedure: COLONOSCOPY;  Surgeon: Daneil Dolin, MD;  Location: [facility];  Service: Endoscopy;  Laterality: N/A;  130  . COLONOSCOPY WITH PROPOFOL N/A 06/24/2017   Procedure: COLONOSCOPY WITH PROPOFOL;  Surgeon: Daneil Dolin, MD;  Location: [facility];  Service: Endoscopy;  Laterality: N/A;  2:15pm  . ESOPHAGOGASTRODUODENOSCOPY  2006   Dr. Gala Romney: Subtle Schatzkis ring, otherwise normal upper GI tract, aside from a small pyloric channell erosion, status post dilation as described above.   Marland Kitchen KNEE SURGERY Bilateral   . POLYPECTOMY  06/24/2017   Procedure: POLYPECTOMY;  Surgeon: Daneil Dolin, MD;  [facility]: [facility];  Service: Endoscopy;;  ascending  . TONSILLECTOMY    .  TOTAL KNEE ARTHROPLASTY Right 05/24/2013   DR MURPHY  . TOTAL KNEE ARTHROPLASTY Right 05/24/2013   Procedure: TOTAL KNEE ARTHROPLASTY;  Surgeon: [name], MD;  Location: [facility];  Service: Orthopedics;  Laterality: Right;   HPI:  The patient is a 72 year old right-handed black female who presents with the acute onset of severe right-sided weakness and difficulty speaking. The patient was recently diagnosed with atrial fibrillation and started on Eliquis. She reports being compliant with this. This was recently  started by 2 to 3 weeks ago. She apparently is very functional at baseline. She has had some psychosocial issues with her husband being in the hospital recently. Patient does not report loss of consciousness, seizure activity, chest pain or shortness of breath. The review of systems otherwise negative. MRI shows acute infarct left external capsule posteriorly. Pt passed the RN swallow screen upon admission, however MD requested BSE.   Assessment / Plan / Recommendation Clinical Impression  Pt presents with mild dysarthria and expressive language deficits characterized by reduced vocal intensity with mild harsh quality, reduced speech intelligibility which is apparently improved this AM from previous day/time, and mild delays in response to open ended questions. Pt is alert and oriented. She tells me that she was completely independent (driving, cooking, home management, and cleaning) prior to admission. Her husband is undergoing treatment for leukemia at [place] [place] f/u SLP services for dysarthria, dysphagia, and ongoing assessment of cognitive linguistic needs at next venue of care. SLP will follow during acute stay. Pt is receptive to continued therapy post acute stay.     SLP Assessment  SLP Recommendation/Assessment: Patient needs continued Speech Lanaguage Pathology Services SLP Visit Diagnosis: Dysarthria and anarthria (R47.1);Cognitive communication deficit (R41.841)    Follow Up Recommendations  Inpatient Rehab;Skilled Nursing facility    Frequency and Duration min 2x/week  1 week      SLP Evaluation Cognition  Overall Cognitive Status: Within Functional Limits for tasks assessed Arousal/Alertness: Awake/alert Orientation Level: Oriented X4 Memory: Appears intact Awareness: Appears intact Problem Solving: Appears intact Safety/Judgment: Appears intact       Comprehension  Auditory Comprehension Overall Auditory Comprehension: Appears within functional limits for tasks  assessed Yes/No Questions: Within Functional Limits Commands: Within Functional Limits Conversation: Complex Visual Recognition/Discrimination Discrimination: Not tested Reading Comprehension Reading Status: Not tested    Expression Expression Primary Mode of Expression: Verbal Verbal Expression Overall Verbal Expression: Impaired Initiation: No impairment(slight delay in responses) Automatic Speech: Name;Social Response;Day of week Level of Generative/Spontaneous Verbalization: Conversation Repetition: Impaired Level of Impairment: Sentence level Naming: No impairment Pragmatics: No impairment Interfering Components: Speech intelligibility Non-Verbal Means of Communication: Not applicable Written Expression Dominant Hand: Right Written Expression: Unable to assess (comment)   Oral / Motor  Oral Motor/Sensory Function Overall Oral Motor/Sensory Function: Mild impairment Facial ROM: Reduced right Facial Symmetry: Abnormal symmetry right Facial Strength: Reduced right;Suspected CN VII (facial) dysfunction Facial Sensation: Within Functional Limits Lingual ROM: Within Functional Limits Lingual Symmetry: Within Functional Limits Lingual Strength: Within Functional Limits Lingual Sensation: Within Functional Limits Velum: Within Functional Limits Mandible: Within Functional Limits Motor Speech Overall Motor Speech: Impaired Respiration: Within functional limits Phonation: Low vocal intensity(mild harshness) Resonance: Within functional limits Articulation: Impaired Level of Impairment: Sentence Intelligibility: Intelligibility reduced Word: 75-100% accurate Phrase: 75-100% accurate Sentence: 75-100% accurate Conversation: 75-100% accurate Motor Planning: Witnin functional limits Motor Speech Errors: Unaware;Aware Effective Techniques: Slow rate;Over-articulate;Increased vocal intensity    Thank you,  Genene Churn, CCC-SLP [phone]  Wasif Simonich 10/31/2017, 10:02 AM

## 2017-10-31 NOTE — Progress Notes (Signed)
Patient Demographics:    Holly Flores, is a 72 y.o. female, DOB - 14-Nov-1945, WLN:[id number]  Admit date - 10/29/2017   Admitting Physician Neamiah Sciarra Denton Brick, MD  Outpatient Primary MD for the patient is Octavio Graves, DO  LOS - 2   Chief Complaint  Patient presents with  . Code Stroke        Subjective:    Holly Flores today has no fevers, no emesis,  No chest pain,  Daughter, granddaughter and great granddaughter at bedside, right-sided hemiplegia persist, no significant changes in the last 24 hours, tolerating oral intake okay  Assessment  & Plan :    Principal Problem:   CVA (cerebral vascular accident) ([place]) Active Problems:   Hyperlipidemia   Central retinal vein occlusion of left eye   COPD (chronic obstructive pulmonary disease) (H[facility])   Chronic diastolic CHF (congestive heart failure) (HCC)   Hypothyroidism   Essential hypertension   GERD (gastroesophageal reflux disease)   Anxiety  Brief Summary 72 year old female with past medical history relevant for hypertension, history of paroxysmal A. fib/atrial flutter who was started on Eliquis 3 weeks ago (09/06/17) admitted on 10/29/2017 with right-sided hemiplegia with MRI showing new acute stroke.  Plan:- 1)Acute CVA -right-sided hemiplegia persists,  MRI brain shows acute infarct of the left external capsule posteriorly, d/w neurologist  Dr. Jerral Bonito who recommends holding Eliquis in order to avoid hemorrhagic transformation, treat empirically with aspirin 325 mg daily for the next 3 days then may go back to Eliquis on 11/01/17, c/n Lipitor 80 mg daily,  echocardiogram w/o intracardiac thrombus  and  EF is 60 to 65 %, CTA Neck /Head without  large Vessel occlusion or hemodynamically significant stenosis .     PT/OT eval appreciated, and recommended inpatient rehab speech eval requested, We will allow some permissive hypertension in view of  possible acute stroke, avoid precipitous drop in blood pressure. Use Hydralazine 10mg  iv every 4 hrs as needed for systolic blood pressure over 220 mmhg , TSH is 1.8, A1c is 6.1, and fasting lipid profile  LDL is 91, HDL is 33,   2)HTN- Allow some permissive Hypertension due to acute stroke, avoid very aggressive, rapid BP control at this time. Use Hydralazine 10 mg iv every 4 hrs as needed for systolic blood pressure over 220 mmhg ,   3)PAFib/Flutter -Eliquis on hold for now for the next 72 hours to avoid hemorrhagic transformation, continue aspirin 325 mg daily as advised by neurologist for now, restart Eliquis on 11/01/2017  Disposition/Need for in-Hospital Stay- patient unable to be discharged at this time due to Acute CVA-patient will need insurance authorization to go to inpatient rehab  Code Status : Full    Disposition Plan  : Awaiting insurance authorization to go to CIR/inpatient rehab  Consults  : Neurology/PT/OT  DVT Prophylaxis  :   Heparin   Lab Results  Component Value Date   PLT 248 10/30/2017    Inpatient Medications  Scheduled Meds: . ALPRAZolam  1 mg Oral QHS  . aspirin  325 mg Oral Daily  . atorvastatin  80 mg Oral Daily  . heparin  5,000 Units Subcutaneous Q8H  . levothyroxine  50 mcg Oral QAC breakfast  . omega-3 acid ethyl esters  2 g  Oral Daily  . pantoprazole  40 mg Oral Daily  . potassium chloride SA  20 mEq Oral Daily  . pyridOXINE  100 mg Oral Daily  . sodium chloride flush  3 mL Intravenous Q12H  . topiramate  100 mg Oral BID  . torsemide  20 mg Oral Daily  . [START ON 11/01/2017] Vitamin D (Ergocalciferol)  50,000 Units Oral Once per day on Mon Thu   Continuous Infusions: . sodium chloride     PRN Meds:.sodium chloride, acetaminophen **OR** acetaminophen, albuterol, hydrALAZINE, ondansetron **OR** ondansetron (ZOFRAN) IV, oxyCODONE-acetaminophen **AND** oxyCODONE, polyethylene glycol, polyvinyl alcohol, sodium chloride flush,  traZODone    Anti-infectives (From admission, onward)   None        Objective:   Vitals:   10/31/17 0800 10/31/17 1200 10/31/17 1527 10/31/17 1600  BP: (!) 151/85 (!) 144/88 (!) 165/91 (!) 146/87  Pulse: 79 73 (!) 102 83  Resp: 18 18 20 18   Temp: 98.4 F (36.9 C) 98.4 F (36.9 C) 98.3 F (36.8 C) 98.8 F (37.1 C)  TempSrc: Oral Oral Oral Oral  SpO2: 96% 99% 98% 98%  Weight:      Height:        Wt Readings from Last 3 Encounters:  10/29/17 95.3 kg  09/06/17 99 kg  06/24/17 99.4 kg     Intake/Output Summary (Last 24 hours) at 10/31/2017 1931 Last data filed at 10/31/2017 1818 Gross per 24 hour  Intake 483 ml  Output 1600 ml  Net -1117 ml     Physical Exam Patient is examined daily including today on 10/31/17 , exams remain the same as of yesterday except that has changed   Gen:- Awake Alert,  In no apparent distress  HEENT:- [place].AT, No sclera icterus Neck-Supple Neck,No JVD,.  Lungs-  CTAB , good air movement bilaterally CV- S1, S2 normal Abd-  +ve B.Sounds, Abd Soft, No tenderness,    Extremity/Skin:- No  edema,   good pulses Psych-affect is appropriate, oriented x3 Neuro/ Neurologial Exam:- Neurological -right lower extremity is pretty weak with strength of 1/5, right upper extremities 2/5 extremities Mental Status: Patient is awake, alert, oriented to person, place, month, year, and situation. Patient is able to give a clear and coherent history. No signs of aphasia or neglect Cranial Nerves: II: Visual Fields are full. Pupils are equal, round, and reactive to light.  III,IV, VI: EOMI without ptosis or diploplia.  V: Facial sensation issymmetrical and wnl VII: Facial movement is symmetric.  VIII: hearing is intact to voice X: Uvula elevates symmetrically XI: Shoulder shrug is symmetric. XII: tongue is midline without atrophy or fasciculations.  Motor: -right lower extremity is pretty weak with strength of 1/5, right upper extremities 2/5  extremities Sensory: Sensation is dish in the right upper and right lower extremity to pinprick and to touch Cerebellar: FNFwithout ataxia    Data Review:   Micro Results No results found for this or any previous visit (from the past 240 hour(s)).  Radiology Reports Ct Angio Head W Or Wo Contrast  Result Date: 10/29/2017 CLINICAL DATA:  Stroke, wall of. Weakness. Acute infarct of the left external capsule EXAM: CT ANGIOGRAPHY HEAD AND NECK TECHNIQUE: Multidetector CT imaging of the head and neck was performed using the standard protocol during bolus administration of intravenous contrast. Multiplanar CT image reconstructions and MIPs were obtained to evaluate the vascular anatomy. Carotid stenosis measurements (when applicable) are obtained utilizing NASCET criteria, using the distal internal carotid diameter as the denominator. CONTRAST:  20mL  ISOVUE-370 IOPAMIDOL (ISOVUE-370) INJECTION 76% COMPARISON:  MRI brain 10/29/2017. CT head without contrast 10/29/2017. FINDINGS: CTA NECK FINDINGS Aortic arch: There is a common origin of the left common carotid artery and the innominate artery. No significant stenosis is present. Atherosclerotic changes are present. There is no aneurysm. Right carotid system: The right common carotid artery is within normal limits. Bifurcation is unremarkable. The cervical right ICA is tortuous without significant stenosis. Minimal atherosclerotic calcifications Left carotid system: The left common carotid artery is within normal limits. The bifurcation is within normal limits. There is mild tortuosity of the cervical left ICA without significant stenosis. Vertebral arteries: The left vertebral artery is dominant. Both vertebral arteries originate from the subclavian arteries without significant stenosis. There is no focal stenosis or injury to either vascular vertebral artery in the neck. Skeleton: Extensive anterior cervical spine fusion is present C3-6. Vertebral body  heights are maintained. No focal lytic or blastic lesions are present. Other neck: A multinodular goiter extends into the upper mediastinum. There is no dominant lesion. Salivary glands are within normal limits. No significant adenopathy is present. Upper chest: The lung apices demonstrate mild ground-glass attenuation without focal airspace consolidation. No nodule or mass lesion is present. Review of the MIP images confirms the above findings CTA HEAD FINDINGS Anterior circulation: Minimal calcifications are present within the cavernous internal carotid arteries. There is no significant stenosis from the skull base through the ICA terminus bilaterally. The anterior communicating artery is patent. A1 and M1 segments are normal. The ACA and MCA branch vessels are within normal limits. MCA bifurcations are unremarkable. Posterior circulation: The left vertebral artery is dominant vessel. PICA origin is visualized and normal. Vertebrobasilar junction is. Both posterior cerebral arteries originate from the basilar tip. 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 PCA branch vessels are within normal limits bilaterally. Venous sinuses: The dural sinuses are patent. Straight sinus and deep cerebral veins are intact. Cortical veins are within normal limits. Anatomic variants: None Delayed phase: The postcontrast images exaggerate subcortical white matter changes bilaterally. The left internal capsule infarct is still poorly seen. No pathologic enhancement is present. Review of the MIP images confirms the above findings IMPRESSION: 1. No large vessel occlusion. 2. No significant proximal stenosis account for the patient's infarct. 3. 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 This can be followed with MRA or CTA on an annual basis. 4. Multinodular goiter. 5. Moderate diffuse white matter disease likely reflects the sequela of chronic microvascular ischemia. Electronically Signed   By:  San Morelle M.D.   On: 10/29/2017 13:37   Ct Angio Neck W Or Wo Contrast  Result Date: 10/29/2017 CLINICAL DATA:  Stroke, wall of. Weakness. Acute infarct of the left external capsule EXAM: CT ANGIOGRAPHY HEAD AND NECK TECHNIQUE: Multidetector CT imaging of the head and neck was performed using the standard protocol during bolus administration of intravenous contrast. Multiplanar CT image reconstructions and MIPs were obtained to evaluate the vascular anatomy. Carotid stenosis measurements (when applicable) are obtained utilizing NASCET criteria, using the distal internal carotid diameter as the denominator. CONTRAST:  51mL ISOVUE-370 IOPAMIDOL (ISOVUE-370) INJECTION 76% COMPARISON:  MRI brain 10/29/2017. CT head without contrast 10/29/2017. FINDINGS: CTA NECK FINDINGS Aortic arch: There is a common origin of the left common carotid artery and the innominate artery. No significant stenosis is present. Atherosclerotic changes are present. There is no aneurysm. Right carotid system: The right common carotid artery is within normal limits. Bifurcation is  unremarkable. The cervical right ICA is tortuous without significant stenosis. Minimal atherosclerotic calcifications Left carotid system: The left common carotid artery is within normal limits. The bifurcation is within normal limits. There is mild tortuosity of the cervical left ICA without significant stenosis. Vertebral arteries: The left vertebral artery is dominant. Both vertebral arteries originate from the subclavian arteries without significant stenosis. There is no focal stenosis or injury to either vascular vertebral artery in the neck. Skeleton: Extensive anterior cervical spine fusion is present C3-6. Vertebral body heights are maintained. No focal lytic or blastic lesions are present. Other neck: A multinodular goiter extends into the upper mediastinum. There is no dominant lesion. Salivary glands are within normal limits. No significant  adenopathy is present. Upper chest: The lung apices demonstrate mild ground-glass attenuation without focal airspace consolidation. No nodule or mass lesion is present. Review of the MIP images confirms the above findings CTA HEAD FINDINGS Anterior circulation: Minimal calcifications are present within the cavernous internal carotid arteries. There is no significant stenosis from the skull base through the ICA terminus bilaterally. The anterior communicating artery is patent. A1 and M1 segments are normal. The ACA and MCA branch vessels are within normal limits. MCA bifurcations are unremarkable. Posterior circulation: The left vertebral artery is dominant vessel. PICA origin is visualized and normal. Vertebrobasilar junction is. Both posterior cerebral arteries originate from the basilar tip. 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 PCA branch vessels are within normal limits bilaterally. Venous sinuses: The dural sinuses are patent. Straight sinus and deep cerebral veins are intact. Cortical veins are within normal limits. Anatomic variants: None Delayed phase: The postcontrast images exaggerate subcortical white matter changes bilaterally. The left internal capsule infarct is still poorly seen. No pathologic enhancement is present. Review of the MIP images confirms the above findings IMPRESSION: 1. No large vessel occlusion. 2. No significant proximal stenosis account for the patient's infarct. 3. 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 This can be followed with MRA or CTA on an annual basis. 4. Multinodular goiter. 5. Moderate diffuse white matter disease likely reflects the sequela of chronic microvascular ischemia. Electronically Signed   By: San Morelle M.D.   On: 10/29/2017 13:37   Mr Brain Wo Contrast  Result Date: 10/29/2017 CLINICAL DATA:  Hemiplegia EXAM: MRI HEAD WITHOUT CONTRAST TECHNIQUE: Multiplanar, multiecho pulse sequences of the brain and  surrounding structures were obtained without intravenous contrast. COMPARISON:  CT head 10/29/2017 FINDINGS: Brain: Acute infarct left posterior lenticulostriate. This involves the posterior external capsule fibers. No other acute infarct. Ventricle size normal. Moderate chronic microvascular ischemic change in the white matter. Chronic microhemorrhage in the left frontal periventricular white matter. Negative for mass or edema. Vascular: Normal arterial flow voids Skull and upper cervical spine: Negative Sinuses/Orbits: Paranasal sinuses clear. Bilateral cataract surgery. Other: None IMPRESSION: Acute infarct left external capsule posteriorly. Moderate chronic microvascular ischemic change. Chronic microhemorrhage left frontal white matter adjacent to the head of caudate. Electronically Signed   By: Franchot Gallo M.D.   On: 10/29/2017 08:53   Ct Head Code Stroke Wo Contrast`  Result Date: 10/29/2017 CLINICAL DATA:  Code stroke. Sudden onset difficulty talking and weakness. EXAM: CT HEAD WITHOUT CONTRAST TECHNIQUE: Contiguous axial images were obtained from the base of the skull through the vertex without intravenous contrast. COMPARISON:  05/20/2017 CT head. FINDINGS: Brain: No evidence of acute infarction, hemorrhage, hydrocephalus, extra-axial collection or mass lesion/mass effect. Stable chronic microvascular ischemic changes and volume  loss of the brain. Vascular: Calcific atherosclerosis of carotid siphons. No hyperdense vessel identified. Skull: Normal. Negative for fracture or focal lesion. Sinuses/Orbits: No acute finding. Bilateral intra-ocular lens replacement. Other: None. ASPECTS [facility] Stroke Program Early CT Score) - Ganglionic level infarction (caudate, lentiform nuclei, internal capsule, insula, M1-M3 cortex): 7 - Supraganglionic infarction (M4-M6 cortex): 3 Total score (0-10 with 10 being normal): 10 IMPRESSION: 1. No acute intracranial abnormality identified. 2. Stable chronic microvascular  ischemic changes and volume loss of the brain. 3. ASPECTS is 10 These results were called by telephone at the time of interpretation on 10/29/2017 at 4:35 am to Dr. Veryl Speak , who verbally acknowledged these results. Electronically Signed   By: Kristine Garbe M.D.   On: 10/29/2017 04:39     CBC Recent Labs  Lab 10/29/17 0433 10/30/17 0612  WBC 11.3* 9.1  HGB 13.2 12.7  HCT 40.0 39.7  PLT 245 248  MCV 92.6 93.4  MCH 30.6 29.9  MCHC 33.0 32.0  RDW 13.8 13.9  LYMPHSABS 5.4*  --   MONOABS 0.8  --   EOSABS 0.5  --   BASOSABS 0.1  --     Chemistries  Recent Labs  Lab 10/29/17 0433 10/30/17 0612 10/31/17 0603  NA 143 140 141  K 3.4* 3.2* 3.6  CL 110 109 111  CO2 25 24 23   GLUCOSE 115* 107* 106*  BUN 15 17 16   CREATININE 1.28* 1.21* 1.28*  CALCIUM 8.7* 8.7* 8.7*  AST 13*  --   --   ALT 8  --   --   ALKPHOS 77  --   --   BILITOT 0.5  --   --    ------------------------------------------------------------------------------------------------------------------ Recent Labs    10/30/17 0612  CHOL 150  HDL 33*  LDLCALC 91  TRIG 128  CHOLHDL 4.5    Lab Results  Component Value Date   HGBA1C 6.1 (H) 10/30/2017   ------------------------------------------------------------------------------------------------------------------ Recent Labs    10/30/17 0612  TSH 1.882   ------------------------------------------------------------------------------------------------------------------ No results for input(s): VITAMINB12, FOLATE, FERRITIN, TIBC, IRON, RETICCTPCT in the last 72 hours.  Coagulation profile Recent Labs  Lab 10/29/17 0433  INR 1.23    No results for input(s): DDIMER in the last 72 hours.  Cardiac Enzymes Recent Labs  Lab 10/29/17 0539  TROPONINI <0.03   ------------------------------------------------------------------------------------------------------------------    Component Value Date/Time   BNP 38.0 01/16/2015 1219      Holly Flores M.D on 10/31/2017 at 7:31 PM  Pager---[phone] Go to [email or web address] - password [email or web address] for contact info  [facility] - Office  [phone]

## 2017-10-31 NOTE — Evaluation (Signed)
Clinical/Bedside Swallow Evaluation Patient Details  Name: Holly Flores MRN: [id number] Date of Birth: 12-Oct-1945  Today's Date: 10/31/2017 Time: SLP Start Time (ACUTE ONLY): 0825 SLP Stop Time (ACUTE ONLY): 0848 SLP Time Calculation (min) (ACUTE ONLY): 23 min  Past Medical History:  Past Medical History:  Diagnosis Date  . Anxiety   . Arthritis   . CHF (congestive heart failure) ([place])   . COPD (chronic obstructive pulmonary disease) ([place])   . Depression   . GERD (gastroesophageal reflux disease)   . Goiter   . Hyperlipidemia   . Hypertension   . Hypothyroidism   . Kidney cysts    left side  . Myocardial infarction ([place])    8 yrs ago.   Marland Kitchen Shortness of breath   . Sleep apnea    uses CPAP, 3  . Stroke ([place])   . 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   Past Surgical History:  Past Surgical History:  Procedure Laterality Date  . ABDOMINAL HYSTERECTOMY    . BACK SURGERY     x3,cervical and lunmbar, disc.  Marland Kitchen BREAST BIOPSY     left-non cancerous  . CARDIAC CATHETERIZATION    . CARPAL TUNNEL RELEASE Left   . CATARACT EXTRACTION W/PHACO  09/21/2011   Procedure: CATARACT EXTRACTION PHACO AND INTRAOCULAR LENS PLACEMENT (IOC);  Surgeon: Williams Che, MD;  Location: [facility];  Service: Ophthalmology;  Laterality: Left;  CDE:9.78  . CATARACT EXTRACTION W/PHACO Right 10/15/2014   Procedure: CATARACT EXTRACTION PHACO AND INTRAOCULAR LENS PLACEMENT ([place]);  Surgeon: Williams Che, MD;  Location: [facility];  Service: Ophthalmology;  Laterality: Right;  CDE:4.19  . CERVICAL SPINE SURGERY    . CESAREAN SECTION     x2  . CHOLECYSTECTOMY    . COLONOSCOPY  2006   RMR: 1. Internal hemorroids, otherwise normal rectum. 2. Pedunculated polyp at 35 cm. reomved with snare. The remainder of teh colonic mucosa appeared normal.   . COLONOSCOPY  2011   Dr. Gala Romney: multiple ascending colon polyps and rectal polyp, adenomatous  . COLONOSCOPY N/A 05/31/2014   Procedure: COLONOSCOPY;   Surgeon: Daneil Dolin, MD;  Location: [facility];  Service: Endoscopy;  Laterality: N/A;  130  . COLONOSCOPY WITH PROPOFOL N/A 06/24/2017   Procedure: COLONOSCOPY WITH PROPOFOL;  Surgeon: Daneil Dolin, MD;  Location: [facility];  Service: Endoscopy;  Laterality: N/A;  2:15pm  . ESOPHAGOGASTRODUODENOSCOPY  2006   Dr. Gala Romney: Subtle Schatzkis ring, otherwise normal upper GI tract, aside from a small pyloric channell erosion, status post dilation as described above.   Marland Kitchen KNEE SURGERY Bilateral   . POLYPECTOMY  06/24/2017   Procedure: POLYPECTOMY;  Surgeon: Daneil Dolin, MD;  Location: [facility];  Service: Endoscopy;;  ascending  . TONSILLECTOMY    . TOTAL KNEE ARTHROPLASTY Right 05/24/2013   DR MURPHY  . TOTAL KNEE ARTHROPLASTY Right 05/24/2013   Procedure: TOTAL KNEE ARTHROPLASTY;  Surgeon: [name], MD;  Location: [facility];  Service: Orthopedics;  Laterality: Right;   HPI:  The patient is a 72 year old right-handed black female who presents with the acute onset of severe right-sided weakness and difficulty speaking. The patient was recently diagnosed with atrial fibrillation and started on Eliquis. She reports being compliant with this. This was recently started by 2 to 3 weeks ago. She apparently is very functional at baseline. She has had some psychosocial issues with her husband being in the hospital  recently. Patient does not report loss of consciousness, seizure activity, chest pain or shortness of breath. The review of systems otherwise negative. MRI shows acute infarct left external capsule posteriorly. Pt passed the RN swallow screen upon admission, however MD requested BSE.   Assessment / Plan / Recommendation Clinical Impression  Clinical swallow evaluation completed at bedside. Pt denies difficulty swallowing. Oral motor examination reveals mild right facial asymmetry, however lingual ROM appears WNL. Pt's vocal quality is mildly harsh and speech is dysarthric. Pt  denies difficulty chewing and swallowing, however much of breakfast tray untouched. She states that she does not like oatmeal or eggs, which were on tray. Pt self presented 4 oz water via sequential cup sips and exhibited no overt signs or symptoms of aspiration. Pt with prolonged oral prep and mild oral residue following regular textures, however she denies the same. She independently cleared oral residuals with repeat swallows and liquid wash. Recommend downgrade to D3/mech soft with thin liquids with SLP to follow for diet tolerance and upgrades as appropriate. RN reports that Pt is afebrile and lungs clear. Given acute infarct with dense right hemiparesis and mild dysarthria, SLP will continue to follow; SLE on next note.    SLP Visit Diagnosis: Dysphagia, unspecified (R13.10)    Aspiration Risk  Mild aspiration risk    Diet Recommendation Dysphagia 3 (Mech soft);Thin liquid   Liquid Administration via: Cup;Straw Medication Administration: Whole meds with liquid Supervision: Patient able to self feed;Intermittent supervision to cue for compensatory strategies Compensations: Lingual sweep for clearance of pocketing Postural Changes: Seated upright at 90 degrees;Remain upright for at least 30 minutes after po intake    Other  Recommendations Oral Care Recommendations: Oral care BID;Staff/trained caregiver to provide oral care Other Recommendations: Clarify dietary restrictions   Follow up Recommendations Inpatient Rehab      Frequency and Duration min 2x/week  1 week       Prognosis Prognosis for Safe Diet Advancement: Good Barriers to Reach Goals: Severity of deficits      Swallow Study   General Date of Onset: 10/29/17 HPI: The patient is a 72 year old right-handed black female who presents with the acute onset of severe right-sided weakness and difficulty speaking. The patient was recently diagnosed with atrial fibrillation and started on Eliquis. She reports being compliant  with this. This was recently started by 2 to 3 weeks ago. She apparently is very functional at baseline. She has had some psychosocial issues with her husband being in the hospital recently. Patient does not report loss of consciousness, seizure activity, chest pain or shortness of breath. The review of systems otherwise negative. MRI shows acute infarct left external capsule posteriorly. Pt passed the RN swallow screen upon admission, however MD requested BSE. Type of Study: Bedside Swallow Evaluation Previous Swallow Assessment: None on record Diet Prior to this Study: Regular;Thin liquids Temperature Spikes Noted: No Respiratory Status: Room air History of Recent Intubation: No Behavior/Cognition: Alert;Cooperative;Pleasant mood Oral Cavity Assessment: Within Functional Limits Oral Care Completed by SLP: Recent completion by staff Oral Cavity - Dentition: Dentures, top Vision: Functional for self-feeding Self-Feeding Abilities: Able to feed self;Needs set up(dense RUE weakness) Patient Positioning: Upright in bed Baseline Vocal Quality: Normal(mildly strangled/harsh) Volitional Cough: Weak Volitional Swallow: Able to elicit    Oral/Motor/Sensory Function Overall Oral Motor/Sensory Function: Mild impairment Facial ROM: Reduced right Facial Symmetry: Abnormal symmetry right Facial Strength: Reduced right;Suspected CN VII (facial) dysfunction Facial Sensation: Within Functional Limits Lingual ROM: Within Functional Limits Lingual Symmetry: Within Functional  Limits Lingual Strength: Within Functional Limits Lingual Sensation: Within Functional Limits Velum: Within Functional Limits Mandible: Within Functional Limits   Ice Chips Ice chips: Within functional limits   Thin Liquid Thin Liquid: Within functional limits Presentation: Cup;Self Fed;Straw    Nectar Thick Nectar Thick Liquid: Not tested   Honey Thick Honey Thick Liquid: Not tested   Puree Puree: Within functional  limits Presentation: Spoon   Solid     Solid: Impaired Presentation: Self Fed Oral Phase Impairments: Reduced lingual movement/coordination Oral Phase Functional Implications: Prolonged oral transit;Oral residue(Pt independently clears, but takes time)     Thank you,  Genene Churn, [place] 10/31/2017,8:57 AM

## 2017-10-31 NOTE — Progress Notes (Addendum)
Physical Therapy Treatment Patient Details Name: Holly Flores MRN: [id number] DOB: 1946-01-14 Today's Date: 10/31/2017    History of Present Illness Patient is a 72 year old female with past medical history of CHF, COPD, hypertension, coronary artery disease.  She presents today for evaluation of weakness in her right arm and right leg.  She states that she was in her normal state of health, however when she woke up at 3:30 AM she was having difficulty moving her right arm and right leg.  911 was called and the patient was brought here as a code stroke.  She denies any headache.  She denies any injury or trauma.  She is currently taking Eliquis.    PT Comments    Patient found awake and alert working with speech therapist upon entering room. Nurse also present. After SLP finished, patient performed bed mobility with max assistance for right upper and lower extremities. Patient performed sit to stand transfer with max assistance and with hemi-walker. Then small side steps. Patient performed stand pivot transfer with max assist of 2 to bedside chair. Patient would benefit from continued skilled physical therapy to work on improving patient's independence with bed mobility, transfers, and overall functional mobility and strength. Later helped nursing staff return patient to bed with similar stand pivot transfer. Patient would benefit from CIR therapy following discharge as patient is motivated and can tolerate therapy.   Follow Up Recommendations  CIR ; SNF     Equipment Recommendations  None recommended by PT    Recommendations for Other Services       Precautions / Restrictions Precautions Precautions: Fall Restrictions Weight Bearing Restrictions: No    Mobility  Bed Mobility Overal bed mobility: Needs Assistance Bed Mobility: Supine to Sit;Sit to Supine     Supine to sit: Max assist Sit to supine: Max assist   General bed mobility comments: slow labored  movement  Transfers Overall transfer level: Needs assistance Equipment used: Hemi-walker Transfers: Sit to/from [facility] Sit to Stand: Max assist Stand pivot transfers: +2 physical assistance       General transfer comment: Used HW for sit to stand transfer with max assistance of 1; stand pivot transfer with therapist and nurse max A without assistive device to bedside chair.   Ambulation/Gait Ambulation/Gait assistance: Max assist Gait Distance (Feet): 2 Feet Assistive device: Hemi-walker Gait Pattern/deviations: Decreased step length - right;Decreased stance time - right;Decreased stride length Gait velocity: slow   General Gait Details: limited to 3-4 shuffling side steps with Max tactile assist to move RLE at bedside, unsafe to attempt taking steps away from bedside. Assistance provided by therapist, nurse stand by assist.    Stairs             Wheelchair Mobility    Modified Rankin (Stroke Patients Only)       Balance Overall balance assessment: Needs assistance Sitting-balance support: Feet supported;No upper extremity supported Sitting balance-Leahy Scale: Fair     Standing balance support: During functional activity;Single extremity supported Standing balance-Leahy Scale: Poor Standing balance comment: requires Max hand held assist to stand                            Cognition Arousal/Alertness: Awake/alert Behavior During Therapy: WFL for tasks assessed/performed Overall Cognitive Status: Within Functional Limits for tasks assessed  Exercises      General Comments        Pertinent Vitals/Pain Pain Assessment: No/denies pain    Home Living     Available Help at Discharge: Family Type of Home: House              Prior Function            PT Goals (current goals can now be found in the care plan section) Acute Rehab PT Goals Patient Stated Goal:  return home after rehab PT Goal Formulation: With patient/family Time For Goal Achievement: 11/12/17 Potential to Achieve Goals: Fair Progress towards PT goals: Progressing toward goals    Frequency    7X/week      PT Plan Discharge plan needs to be updated    Co-evaluation              AM-PAC PT "6 Clicks" Daily Activity  Outcome Measure  Difficulty turning over in bed (including adjusting bedclothes, sheets and blankets)?: A Lot Difficulty moving from lying on back to sitting on the side of the bed? : A Lot Difficulty sitting down on and standing up from a chair with arms (e.g., wheelchair, bedside commode, etc,.)?: A Lot Help needed moving to and from a bed to chair (including a wheelchair)?: Total Help needed walking in hospital room?: Total Help needed climbing 3-5 steps with a railing? : Total 6 Click Score: 9    End of Session Equipment Utilized During Treatment: Gait belt Activity Tolerance: Patient tolerated treatment well;Patient limited by fatigue Patient left: with call bell/phone within reach;in chair;with SCD's reapplied;with nursing/sitter in room Nurse Communication: Mobility status PT Visit Diagnosis: Other abnormalities of gait and mobility (R26.89);Unsteadiness on feet (R26.81);Muscle weakness (generalized) (M62.81)     Time: 9604-5409 PT Time Calculation (min) (ACUTE ONLY): 12 min  Charges:  $Therapeutic Activity: 8-22 mins                     Clarene Critchley PT, DPT 10:50 AM, 10/31/17 [phone]

## 2017-11-01 ENCOUNTER — Other Ambulatory Visit: Payer: Self-pay

## 2017-11-01 ENCOUNTER — Inpatient Hospital Stay (HOSPITAL_COMMUNITY)
Admission: RE | Admit: 2017-11-01 | Discharge: 2017-11-09 | DRG: 057 | Disposition: A | Discharge status: Other Acute Inpatient Hospital | Payer: Medicare Other | Source: Intra-hospital | Attending: Physical Medicine & Rehabilitation | Admitting: Physical Medicine & Rehabilitation

## 2017-11-01 ENCOUNTER — Encounter (HOSPITAL_COMMUNITY): Payer: Self-pay | Admitting: *Deleted

## 2017-11-01 DIAGNOSIS — K219 Gastro-esophageal reflux disease without esophagitis: Secondary | ICD-10-CM | POA: Diagnosis present

## 2017-11-01 DIAGNOSIS — I480 Paroxysmal atrial fibrillation: Secondary | ICD-10-CM

## 2017-11-01 DIAGNOSIS — I130 Hypertensive heart and chronic kidney disease with heart failure and stage 1 through stage 4 chronic kidney disease, or unspecified chronic kidney disease: Secondary | ICD-10-CM | POA: Diagnosis present

## 2017-11-01 DIAGNOSIS — R140 Abdominal distension (gaseous): Secondary | ICD-10-CM

## 2017-11-01 DIAGNOSIS — G8191 Hemiplegia, unspecified affecting right dominant side: Secondary | ICD-10-CM | POA: Diagnosis not present

## 2017-11-01 DIAGNOSIS — E785 Hyperlipidemia, unspecified: Secondary | ICD-10-CM

## 2017-11-01 DIAGNOSIS — Z79899 Other long term (current) drug therapy: Secondary | ICD-10-CM | POA: Diagnosis not present

## 2017-11-01 DIAGNOSIS — I10 Essential (primary) hypertension: Secondary | ICD-10-CM

## 2017-11-01 DIAGNOSIS — J449 Chronic obstructive pulmonary disease, unspecified: Secondary | ICD-10-CM | POA: Diagnosis present

## 2017-11-01 DIAGNOSIS — I252 Old myocardial infarction: Secondary | ICD-10-CM

## 2017-11-01 DIAGNOSIS — R059 Cough, unspecified: Secondary | ICD-10-CM

## 2017-11-01 DIAGNOSIS — I69321 Dysphasia following cerebral infarction: Secondary | ICD-10-CM

## 2017-11-01 DIAGNOSIS — E876 Hypokalemia: Secondary | ICD-10-CM | POA: Diagnosis not present

## 2017-11-01 DIAGNOSIS — Z87891 Personal history of nicotine dependence: Secondary | ICD-10-CM

## 2017-11-01 DIAGNOSIS — G4730 Sleep apnea, unspecified: Secondary | ICD-10-CM | POA: Diagnosis present

## 2017-11-01 DIAGNOSIS — I5032 Chronic diastolic (congestive) heart failure: Secondary | ICD-10-CM | POA: Diagnosis present

## 2017-11-01 DIAGNOSIS — N179 Acute kidney failure, unspecified: Secondary | ICD-10-CM

## 2017-11-01 DIAGNOSIS — K5900 Constipation, unspecified: Secondary | ICD-10-CM | POA: Diagnosis present

## 2017-11-01 DIAGNOSIS — E039 Hypothyroidism, unspecified: Secondary | ICD-10-CM | POA: Diagnosis present

## 2017-11-01 DIAGNOSIS — N182 Chronic kidney disease, stage 2 (mild): Secondary | ICD-10-CM | POA: Diagnosis present

## 2017-11-01 DIAGNOSIS — G8101 Flaccid hemiplegia affecting right dominant side: Secondary | ICD-10-CM

## 2017-11-01 DIAGNOSIS — I639 Cerebral infarction, unspecified: Secondary | ICD-10-CM

## 2017-11-01 DIAGNOSIS — R197 Diarrhea, unspecified: Secondary | ICD-10-CM | POA: Diagnosis not present

## 2017-11-01 DIAGNOSIS — N189 Chronic kidney disease, unspecified: Secondary | ICD-10-CM

## 2017-11-01 DIAGNOSIS — I671 Cerebral aneurysm, nonruptured: Secondary | ICD-10-CM | POA: Diagnosis present

## 2017-11-01 DIAGNOSIS — K567 Ileus, unspecified: Secondary | ICD-10-CM | POA: Diagnosis not present

## 2017-11-01 DIAGNOSIS — K5901 Slow transit constipation: Secondary | ICD-10-CM

## 2017-11-01 DIAGNOSIS — I69398 Other sequelae of cerebral infarction: Secondary | ICD-10-CM

## 2017-11-01 DIAGNOSIS — R110 Nausea: Secondary | ICD-10-CM

## 2017-11-01 DIAGNOSIS — Z880 Allergy status to penicillin: Secondary | ICD-10-CM

## 2017-11-01 DIAGNOSIS — R05 Cough: Secondary | ICD-10-CM

## 2017-11-01 DIAGNOSIS — I63512 Cerebral infarction due to unspecified occlusion or stenosis of left middle cerebral artery: Secondary | ICD-10-CM | POA: Diagnosis present

## 2017-11-01 DIAGNOSIS — I69322 Dysarthria following cerebral infarction: Secondary | ICD-10-CM

## 2017-11-01 DIAGNOSIS — Z882 Allergy status to sulfonamides status: Secondary | ICD-10-CM

## 2017-11-01 DIAGNOSIS — I69351 Hemiplegia and hemiparesis following cerebral infarction affecting right dominant side: Secondary | ICD-10-CM | POA: Diagnosis not present

## 2017-11-01 DIAGNOSIS — Z96651 Presence of right artificial knee joint: Secondary | ICD-10-CM | POA: Diagnosis present

## 2017-11-01 DIAGNOSIS — Z7901 Long term (current) use of anticoagulants: Secondary | ICD-10-CM

## 2017-11-01 DIAGNOSIS — H532 Diplopia: Secondary | ICD-10-CM | POA: Diagnosis present

## 2017-11-01 DIAGNOSIS — I63429 Cerebral infarction due to embolism of unspecified anterior cerebral artery: Secondary | ICD-10-CM

## 2017-11-01 HISTORY — DX: Cerebral infarction due to unspecified occlusion or stenosis of left middle cerebral artery: I63.512

## 2017-11-01 MED ORDER — APIXABAN 5 MG PO TABS: 5.0000 mg | ORAL_TABLET | Freq: Two times a day (BID) | ORAL | Status: DC

## 2017-11-01 MED ORDER — ONDANSETRON HCL 4 MG PO TABS
4.0000 mg | ORAL_TABLET | Freq: Four times a day (QID) | ORAL | Status: DC | PRN
  Administered 2017-11-07: 4 mg via ORAL
  Filled 2017-11-01: qty 1

## 2017-11-01 MED ORDER — VITAMIN B-6 100 MG PO TABS
100.0000 mg | ORAL_TABLET | Freq: Every day | ORAL | Status: DC
  Filled 2017-11-01: qty 1

## 2017-11-01 MED ORDER — POLYVINYL ALCOHOL 1.4 % OP SOLN: 1.0000 [drp] | OPHTHALMIC | Status: DC | PRN

## 2017-11-01 MED ORDER — TORSEMIDE 20 MG PO TABS
20.0000 mg | ORAL_TABLET | Freq: Every day | ORAL | Status: DC
  Administered 2017-11-02 – 2017-11-08 (×7): 20 mg via ORAL
  Filled 2017-11-01 (×7): qty 1

## 2017-11-01 MED ORDER — ORAL CARE MOUTH RINSE
15.0000 mL | Freq: Two times a day (BID) | OROMUCOSAL | Status: DC
  Administered 2017-11-01 – 2017-11-09 (×13): 15 mL via OROMUCOSAL

## 2017-11-01 MED ORDER — VITAMIN D (ERGOCALCIFEROL) 1.25 MG (50000 UNIT) PO CAPS
50000.0000 [IU] | ORAL_CAPSULE | ORAL | Status: DC
  Administered 2017-11-02 – 2017-11-09 (×3): 50000 [IU] via ORAL
  Filled 2017-11-01 (×3): qty 1

## 2017-11-01 MED ORDER — POTASSIUM CHLORIDE CRYS ER 20 MEQ PO TBCR
20.0000 meq | EXTENDED_RELEASE_TABLET | Freq: Every day | ORAL | Status: DC
  Administered 2017-11-02 – 2017-11-07 (×6): 20 meq via ORAL
  Filled 2017-11-01 (×6): qty 1

## 2017-11-01 MED ORDER — APIXABAN 5 MG PO TABS
5.0000 mg | ORAL_TABLET | Freq: Two times a day (BID) | ORAL | Status: DC
  Administered 2017-11-01 – 2017-11-09 (×16): 5 mg via ORAL
  Filled 2017-11-01 (×16): qty 1

## 2017-11-01 MED ORDER — OXYCODONE-ACETAMINOPHEN 5-325 MG PO TABS
1.0000 | ORAL_TABLET | ORAL | Status: DC | PRN
  Administered 2017-11-05 – 2017-11-06 (×4): 1 via ORAL
  Filled 2017-11-01 (×4): qty 1

## 2017-11-01 MED ORDER — ONDANSETRON HCL 4 MG/2ML IJ SOLN: 4.0000 mg | Freq: Four times a day (QID) | INTRAMUSCULAR | Status: DC | PRN

## 2017-11-01 MED ORDER — LEVOTHYROXINE SODIUM 50 MCG PO TABS
50.0000 ug | ORAL_TABLET | Freq: Every day | ORAL | Status: DC
  Administered 2017-11-02 – 2017-11-09 (×8): 50 ug via ORAL
  Filled 2017-11-01 (×8): qty 1

## 2017-11-01 MED ORDER — POLYETHYLENE GLYCOL 3350 17 G PO PACK: 17.0000 g | PACK | Freq: Every day | ORAL | 0 refills | Status: DC | PRN

## 2017-11-01 MED ORDER — POLYETHYLENE GLYCOL 3350 17 G PO PACK: 17.0000 g | PACK | Freq: Every day | ORAL | Status: DC | PRN

## 2017-11-01 MED ORDER — ALBUTEROL SULFATE (2.5 MG/3ML) 0.083% IN NEBU: 2.5000 mg | INHALATION_SOLUTION | RESPIRATORY_TRACT | Status: DC | PRN

## 2017-11-01 MED ORDER — ACETAMINOPHEN 325 MG PO TABS
650.0000 mg | ORAL_TABLET | Freq: Four times a day (QID) | ORAL | Status: DC | PRN
  Administered 2017-11-07 – 2017-11-08 (×4): 650 mg via ORAL
  Filled 2017-11-01 (×4): qty 2

## 2017-11-01 MED ORDER — ALPRAZOLAM 0.5 MG PO TABS
1.0000 mg | ORAL_TABLET | Freq: Every day | ORAL | Status: DC
  Administered 2017-11-01 – 2017-11-08 (×8): 1 mg via ORAL
  Filled 2017-11-01 (×8): qty 2

## 2017-11-01 MED ORDER — TRAZODONE HCL 50 MG PO TABS
50.0000 mg | ORAL_TABLET | Freq: Every evening | ORAL | Status: DC | PRN
  Administered 2017-11-02: 50 mg via ORAL
  Filled 2017-11-01: qty 1

## 2017-11-01 MED ORDER — ATORVASTATIN CALCIUM 80 MG PO TABS
80.0000 mg | ORAL_TABLET | Freq: Every day | ORAL | Status: DC
  Administered 2017-11-02 – 2017-11-08 (×7): 80 mg via ORAL
  Filled 2017-11-01 (×7): qty 1

## 2017-11-01 MED ORDER — OXYCODONE HCL 5 MG PO TABS
5.0000 mg | ORAL_TABLET | ORAL | Status: DC | PRN
  Administered 2017-11-02 – 2017-11-09 (×7): 5 mg via ORAL
  Filled 2017-11-01 (×8): qty 1

## 2017-11-01 MED ORDER — TOPIRAMATE 100 MG PO TABS
100.0000 mg | ORAL_TABLET | Freq: Two times a day (BID) | ORAL | Status: DC
  Administered 2017-11-01 – 2017-11-09 (×16): 100 mg via ORAL
  Filled 2017-11-01 (×16): qty 1

## 2017-11-01 MED ORDER — PANTOPRAZOLE SODIUM 40 MG PO TBEC
40.0000 mg | DELAYED_RELEASE_TABLET | Freq: Every day | ORAL | Status: DC
  Administered 2017-11-02 – 2017-11-09 (×8): 40 mg via ORAL
  Filled 2017-11-01 (×8): qty 1

## 2017-11-01 MED ORDER — ACETAMINOPHEN 650 MG RE SUPP: 650.0000 mg | Freq: Four times a day (QID) | RECTAL | Status: DC | PRN

## 2017-11-01 MED ORDER — OMEGA-3-ACID ETHYL ESTERS 1 G PO CAPS
2.0000 g | ORAL_CAPSULE | Freq: Every day | ORAL | Status: DC
  Administered 2017-11-02 – 2017-11-09 (×8): 2 g via ORAL
  Filled 2017-11-01 (×8): qty 2

## 2017-11-01 MED ORDER — TRAZODONE HCL 100 MG PO TABS: 100.0000 mg | ORAL_TABLET | Freq: Every evening | ORAL | 0 refills | Status: DC | PRN

## 2017-11-01 NOTE — Progress Notes (Signed)
Pt has refused use of cpap at this time. Pt requested a [facility] at this time.  Rt placed t on 2LPM [place].  RN aware. Rt will monitor as needed.

## 2017-11-01 NOTE — Discharge Summary (Signed)
Holly Flores, is a 72 y.o. female  DOB [birth year]  MRN [id number].  Admission date:  10/29/2017  Admitting Physician  Roxan Hockey, MD  Discharge Date:  11/01/2017   Primary MD  Octavio Graves, DO  Recommendations for primary care physician for things to follow:   1) restart Eliquis 5 mg twice daily on 11/01/2017 2) transfer to inpatient rehab for acute stroke with right-sided hemiplegia   Admission Diagnosis  Acute CVA (cerebrovascular accident) [facility]) [I63.9] CVA (cerebral vascular accident) [facility]) [I63.9]   Discharge Diagnosis  Acute CVA (cerebrovascular accident) ([place]) [I63.9] CVA (cerebral vascular accident) ([place]) [I63.9]    Principal Problem:   CVA (cerebral vascular accident) ([place]) Active Problems:   Hyperlipidemia   Central retinal vein occlusion of left eye   COPD (chronic obstructive pulmonary disease) (H[facility])   Chronic diastolic CHF (congestive heart failure) ([place])   Hypothyroidism   Essential hypertension   GERD (gastroesophageal reflux disease)   Anxiety      Past Medical History:  Diagnosis Date  . Anxiety   . Arthritis   . CHF (congestive heart failure) ([place])   . COPD (chronic obstructive pulmonary disease) ([place])   . Depression   . GERD (gastroesophageal reflux disease)   . Goiter   . Hyperlipidemia   . Hypertension   . Hypothyroidism   . Kidney cysts    left side  . Myocardial infarction ([place])    8 yrs ago.   Marland Kitchen Shortness of breath   . Sleep apnea    uses CPAP, 3  . Stroke ([place])   . Stroke ([place])    OCULAR  LEFT  EYE    LAST YR.    Past Surgical History:  Procedure Laterality Date  . ABDOMINAL HYSTERECTOMY    . BACK SURGERY     x3,cervical and lunmbar, disc.  Marland Kitchen BREAST BIOPSY     left-non cancerous  . CARDIAC CATHETERIZATION    . CARPAL TUNNEL RELEASE Left   . CATARACT EXTRACTION W/PHACO  09/21/2011   Procedure: CATARACT EXTRACTION PHACO AND INTRAOCULAR  LENS PLACEMENT (IOC);  Surgeon: Williams Che, MD;  Location: [facility];  Service: Ophthalmology;  Laterality: Left;  CDE:9.78  . CATARACT EXTRACTION W/PHACO Right 10/15/2014   Procedure: CATARACT EXTRACTION PHACO AND INTRAOCULAR LENS PLACEMENT ([place]);  Surgeon: Williams Che, MD;  Location: [facility];  Service: Ophthalmology;  Laterality: Right;  CDE:4.19  . CERVICAL SPINE SURGERY    . CESAREAN SECTION     x2  . CHOLECYSTECTOMY    . COLONOSCOPY  2006   RMR: 1. Internal hemorroids, otherwise normal rectum. 2. Pedunculated polyp at 35 cm. reomved with snare. The remainder of teh colonic mucosa appeared normal.   . COLONOSCOPY  2011   Dr. Gala Romney: multiple ascending colon polyps and rectal polyp, adenomatous  . COLONOSCOPY N/A 05/31/2014   Procedure: COLONOSCOPY;  Surgeon: Daneil Dolin, MD;  Location: [facility];  Service: Endoscopy;  Laterality: N/A;  130  . COLONOSCOPY WITH PROPOFOL N/A 06/24/2017   Procedure:  COLONOSCOPY WITH PROPOFOL;  Surgeon: Daneil Dolin, MD;  Location: [facility];  Service: Endoscopy;  Laterality: N/A;  2:15pm  . ESOPHAGOGASTRODUODENOSCOPY  2006   Dr. Gala Romney: Subtle Schatzkis ring, otherwise normal upper GI tract, aside from a small pyloric channell erosion, status post dilation as described above.   Marland Kitchen KNEE SURGERY Bilateral   . POLYPECTOMY  06/24/2017   Procedure: POLYPECTOMY;  Surgeon: Daneil Dolin, MD;  Location: [facility];  Service: Endoscopy;;  ascending  . TONSILLECTOMY    . TOTAL KNEE ARTHROPLASTY Right 05/24/2013   DR MURPHY  . TOTAL KNEE ARTHROPLASTY Right 05/24/2013   Procedure: TOTAL KNEE ARTHROPLASTY;  Surgeon: [name], MD;  Location: [facility];  Service: Orthopedics;  Laterality: Right;       HPI  from the history and physical done on the day of admission:    Holly Flores  is a 72 y.o. female with hx of prior CVA resulting in baseline mild Left hemiparesis, history of migraine headaches, HTN, dCHF, GERD, COPD, obesity, OSA, history of  atrial flutter/A. Fib who was started on Eliquis on 09/06/2017 presents to the ED with concerns of right-sided weakness And apparently woke up around 3:30 AM and had difficulty with right-sided weakness so she called 911, no headache, no vomiting, no chest pains no palpitations no dizziness no shortness of breath  In ED-initial CT head was negative, telemetry neurology evaluation was obtained by ED provider and request for admission to the hospital was placed to hospitalist service  Subsequent work-up in the ED revealed-persistent right-sided hemiparesis   MRI head with Acute infarct left external capsule posteriorly Moderate chronic microvascular ischemic change. Chronic microhemorrhage left frontal white matter adjacent to the head of caudate.   CTA head and Neck w/o  large vessel occlusion, and No significant proximal stenosis account for the patient's infarct.    Hospital Course:    Brief Summary 72 year old female with past medical history relevant for hypertension, history of paroxysmal A. fib/atrial flutter who was started on Eliquis 3 weeks ago (09/06/17) admitted on 10/29/2017 with right-sided hemiplegia with MRI showing new acute stroke.  Plan:- 1)Acute CVA - dense right-sided hemiplegia persists,  MRI brain shows acute infarct of the left external capsule posteriorly, d/wneurologist Dr. Jerral Bonito who recommended holding Eliquis in order to avoid hemorrhagic transformation, treated empirically with aspirin 325 mg daily for  3 day, back on Eliquis as of   11/01/17,c/n Lipitor 80 mg daily, echocardiogram w/o intracardiac thrombus and  EF is 60 to 65 %, CTA Neck /Headwithoutlarge Vessel occlusion or hemodynamically significant stenosis . PT/OT eval appreciated, and recommended inpatient rehab, speech eval appreciated , we allowed some permissive hypertension in view of  acute stroke, we avoided  precipitous drop in blood pressure. We used  Hydralazine 10mg  iv every 4 hrs as  needed for systolic blood pressure over 220 mmhg , TSH is 1.8, A1c is 6.1, and fasting lipid profile  LDL is 91, HDL is 33,   2)HTN-we allowed some permissive hypertension in view of  acute stroke, we avoided  precipitous drop in blood pressure. We used  Hydralazine 10mg  iv every 4 hrs as needed for systolic blood pressure over 220 mmhg ,  3)PAFib/Flutter  -d/wneurologist Dr. Jerral Bonito who recommended holding Eliquis in order to avoid hemorrhagic transformation, treated empirically with aspirin 325 mg daily for  3 day, back on Eliquis as of   11/01/17 Disposition- inpatient rehab  Code Status : Full    Disposition Plan  :  CIR/inpatient rehab  Consults  : Neurology/PT/OT  DVT Prophylaxis  :   Heparin   Discharge Condition: stable  Follow UP   Consults obtained - verbal consult with Neurology and Tele-Neuro  Diet and Activity recommendation:  As advised  Discharge Instructions    Discharge Instructions    Call MD for:  difficulty breathing, headache or visual disturbances   Complete by:  As directed    Call MD for:  persistant dizziness or light-headedness   Complete by:  As directed    Call MD for:  persistant nausea and vomiting   Complete by:  As directed    Call MD for:  severe uncontrolled pain   Complete by:  As directed    Call MD for:  temperature >100.4   Complete by:  As directed    Diet - low sodium heart healthy   Complete by:  As directed    Discharge instructions   Complete by:  As directed    1) restart Eliquis 5 mg twice daily on 11/01/2017 2) transfer to inpatient rehab for acute stroke with right-sided hemiplegia   Increase activity slowly   Complete by:  As directed    Activity as per physical and occupational therapist at rehab facility, fall precautions due to right-sided hemiplegia       Discharge Medications     Allergies as of 11/01/2017      Reactions   Penicillins Hives, Itching, Rash   Has patient had a PCN reaction causing  immediate rash, facial/tongue/throat swelling, SOB or lightheadedness with hypotension: Yes Has patient had a PCN reaction causing severe rash involving mucus membranes or skin necrosis: Yes Has patient had a PCN reaction that required hospitalization: No Has patient had a PCN reaction occurring within the last 10 years: No If all of the above answers are "NO", then may proceed with Cephalosporin use.   Sulfa Antibiotics Swelling   Tongue swelling      Medication List    STOP taking these medications   torsemide 20 MG tablet Commonly known as:  DEMADEX     TAKE these medications   albuterol 108 (90 Base) MCG/ACT inhaler Commonly known as:  PROVENTIL HFA;VENTOLIN HFA Inhale 2 puffs into the lungs every 6 (six) hours as needed (for wheezing/shortness of breath).   ALPRAZolam 1 MG tablet Commonly known as:  XANAX Take 1 mg by mouth at bedtime.   apixaban 5 MG Tabs tablet Commonly known as:  ELIQUIS Take 1 tablet (5 mg total) by mouth 2 (two) times daily.   ENDOCET 10-325 MG tablet Generic drug:  oxyCODONE-acetaminophen Take 1 tablet by mouth every 4 (four) hours as needed for pain.   esomeprazole 40 MG capsule Commonly known as:  NEXIUM Take 40 mg by mouth daily as needed (heartburn). For heartburn   LEVOTHROID 50 MCG tablet Generic drug:  levothyroxine Take 50 mcg by mouth daily before breakfast.   LUBRICANT EYE DROPS 0.4-0.3 % Soln Generic drug:  Polyethyl Glycol-Propyl Glycol Place 1 drop into both eyes 3 (three) times daily as needed (for dry eyes).   OVER THE COUNTER MEDICATION Apply 1 application topically 3 (three) times daily as needed (for knee pain.). TWO OLD GOATS ESSENTIAL LOTION FOR ACHES & PAINS   polyethylene glycol packet Commonly known as:  MIRALAX / GLYCOLAX Take 17 g by mouth daily as needed for mild constipation.   potassium chloride SA 20 MEQ tablet Commonly known as:  K-DUR,KLOR-CON Take 20 mEq by mouth daily.  traZODone 100 MG  tablet Commonly known as:  DESYREL Take 1 tablet (100 mg total) by mouth at bedtime as needed for sleep.   Vitamin D (Ergocalciferol) 50000 units Caps capsule Commonly known as:  DRISDOL Take 50,000 Units by mouth 2 (two) times a week. On Tuesdays and Thursdays       Major procedures and Radiology Reports - PLEASE review detailed and final reports for all details, in brief -    Ct Angio Head W Or Wo Contrast  Result Date: 10/29/2017 CLINICAL DATA:  Stroke, wall of. Weakness. Acute infarct of the left external capsule EXAM: CT ANGIOGRAPHY HEAD AND NECK TECHNIQUE: Multidetector CT imaging of the head and neck was performed using the standard protocol during bolus administration of intravenous contrast. Multiplanar CT image reconstructions and MIPs were obtained to evaluate the vascular anatomy. Carotid stenosis measurements (when applicable) are obtained utilizing NASCET criteria, using the distal internal carotid diameter as the denominator. CONTRAST:  25mL ISOVUE-370 IOPAMIDOL (ISOVUE-370) INJECTION 76% COMPARISON:  MRI brain 10/29/2017. CT head without contrast 10/29/2017. FINDINGS: CTA NECK FINDINGS Aortic arch: There is a common origin of the left common carotid artery and the innominate artery. No significant stenosis is present. Atherosclerotic changes are present. There is no aneurysm. Right carotid system: The right common carotid artery is within normal limits. Bifurcation is unremarkable. The cervical right ICA is tortuous without significant stenosis. Minimal atherosclerotic calcifications Left carotid system: The left common carotid artery is within normal limits. The bifurcation is within normal limits. There is mild tortuosity of the cervical left ICA without significant stenosis. Vertebral arteries: The left vertebral artery is dominant. Both vertebral arteries originate from the subclavian arteries without significant stenosis. There is no focal stenosis or injury to either vascular  vertebral artery in the neck. Skeleton: Extensive anterior cervical spine fusion is present C3-6. Vertebral body heights are maintained. No focal lytic or blastic lesions are present. Other neck: A multinodular goiter extends into the upper mediastinum. There is no dominant lesion. Salivary glands are within normal limits. No significant adenopathy is present. Upper chest: The lung apices demonstrate mild ground-glass attenuation without focal airspace consolidation. No nodule or mass lesion is present. Review of the MIP images confirms the above findings CTA HEAD FINDINGS Anterior circulation: Minimal calcifications are present within the cavernous internal carotid arteries. There is no significant stenosis from the skull base through the ICA terminus bilaterally. The anterior communicating artery is patent. A1 and M1 segments are normal. The ACA and MCA branch vessels are within normal limits. MCA bifurcations are unremarkable. Posterior circulation: The left vertebral artery is dominant vessel. PICA origin is visualized and normal. Vertebrobasilar junction is. Both posterior cerebral arteries originate from the basilar tip. 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 PCA branch vessels are within normal limits bilaterally. Venous sinuses: The dural sinuses are patent. Straight sinus and deep cerebral veins are intact. Cortical veins are within normal limits. Anatomic variants: None Delayed phase: The postcontrast images exaggerate subcortical white matter changes bilaterally. The left internal capsule infarct is still poorly seen. No pathologic enhancement is present. Review of the MIP images confirms the above findings IMPRESSION: 1. No large vessel occlusion. 2. No significant proximal stenosis account for the patient's infarct. 3. 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 This can be followed with MRA or CTA on an annual basis. 4. Multinodular goiter. 5. Moderate diffuse  white matter disease likely reflects the sequela of chronic microvascular ischemia.  Electronically Signed   By: San Morelle M.D.   On: 10/29/2017 13:37   Ct Angio Neck W Or Wo Contrast  Result Date: 10/29/2017 CLINICAL DATA:  Stroke, wall of. Weakness. Acute infarct of the left external capsule EXAM: CT ANGIOGRAPHY HEAD AND NECK TECHNIQUE: Multidetector CT imaging of the head and neck was performed using the standard protocol during bolus administration of intravenous contrast. Multiplanar CT image reconstructions and MIPs were obtained to evaluate the vascular anatomy. Carotid stenosis measurements (when applicable) are obtained utilizing NASCET criteria, using the distal internal carotid diameter as the denominator. CONTRAST:  44mL ISOVUE-370 IOPAMIDOL (ISOVUE-370) INJECTION 76% COMPARISON:  MRI brain 10/29/2017. CT head without contrast 10/29/2017. FINDINGS: CTA NECK FINDINGS Aortic arch: There is a common origin of the left common carotid artery and the innominate artery. No significant stenosis is present. Atherosclerotic changes are present. There is no aneurysm. Right carotid system: The right common carotid artery is within normal limits. Bifurcation is unremarkable. The cervical right ICA is tortuous without significant stenosis. Minimal atherosclerotic calcifications Left carotid system: The left common carotid artery is within normal limits. The bifurcation is within normal limits. There is mild tortuosity of the cervical left ICA without significant stenosis. Vertebral arteries: The left vertebral artery is dominant. Both vertebral arteries originate from the subclavian arteries without significant stenosis. There is no focal stenosis or injury to either vascular vertebral artery in the neck. Skeleton: Extensive anterior cervical spine fusion is present C3-6. Vertebral body heights are maintained. No focal lytic or blastic lesions are present. Other neck: A multinodular goiter extends into  the upper mediastinum. There is no dominant lesion. Salivary glands are within normal limits. No significant adenopathy is present. Upper chest: The lung apices demonstrate mild ground-glass attenuation without focal airspace consolidation. No nodule or mass lesion is present. Review of the MIP images confirms the above findings CTA HEAD FINDINGS Anterior circulation: Minimal calcifications are present within the cavernous internal carotid arteries. There is no significant stenosis from the skull base through the ICA terminus bilaterally. The anterior communicating artery is patent. A1 and M1 segments are normal. The ACA and MCA branch vessels are within normal limits. MCA bifurcations are unremarkable. Posterior circulation: The left vertebral artery is dominant vessel. PICA origin is visualized and normal. Vertebrobasilar junction is. Both posterior cerebral arteries originate from the basilar tip. 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 PCA branch vessels are within normal limits bilaterally. Venous sinuses: The dural sinuses are patent. Straight sinus and deep cerebral veins are intact. Cortical veins are within normal limits. Anatomic variants: None Delayed phase: The postcontrast images exaggerate subcortical white matter changes bilaterally. The left internal capsule infarct is still poorly seen. No pathologic enhancement is present. Review of the MIP images confirms the above findings IMPRESSION: 1. No large vessel occlusion. 2. No significant proximal stenosis account for the patient's infarct. 3. 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 This can be followed with MRA or CTA on an annual basis. 4. Multinodular goiter. 5. Moderate diffuse white matter disease likely reflects the sequela of chronic microvascular ischemia. Electronically Signed   By: San Morelle M.D.   On: 10/29/2017 13:37   Mr Brain Wo Contrast  Result Date: 10/29/2017 CLINICAL DATA:   Hemiplegia EXAM: MRI HEAD WITHOUT CONTRAST TECHNIQUE: Multiplanar, multiecho pulse sequences of the brain and surrounding structures were obtained without intravenous contrast. COMPARISON:  CT head 10/29/2017 FINDINGS: Brain: Acute infarct left posterior lenticulostriate. This involves  the posterior external capsule fibers. No other acute infarct. Ventricle size normal. Moderate chronic microvascular ischemic change in the white matter. Chronic microhemorrhage in the left frontal periventricular white matter. Negative for mass or edema. Vascular: Normal arterial flow voids Skull and upper cervical spine: Negative Sinuses/Orbits: Paranasal sinuses clear. Bilateral cataract surgery. Other: None IMPRESSION: Acute infarct left external capsule posteriorly. Moderate chronic microvascular ischemic change. Chronic microhemorrhage left frontal white matter adjacent to the head of caudate. Electronically Signed   By: Franchot Gallo M.D.   On: 10/29/2017 08:53   Ct Head Code Stroke Wo Contrast`  Result Date: 10/29/2017 CLINICAL DATA:  Code stroke. Sudden onset difficulty talking and weakness. EXAM: CT HEAD WITHOUT CONTRAST TECHNIQUE: Contiguous axial images were obtained from the base of the skull through the vertex without intravenous contrast. COMPARISON:  05/20/2017 CT head. FINDINGS: Brain: No evidence of acute infarction, hemorrhage, hydrocephalus, extra-axial collection or mass lesion/mass effect. Stable chronic microvascular ischemic changes and volume loss of the brain. Vascular: Calcific atherosclerosis of carotid siphons. No hyperdense vessel identified. Skull: Normal. Negative for fracture or focal lesion. Sinuses/Orbits: No acute finding. Bilateral intra-ocular lens replacement. Other: None. ASPECTS [facility] Stroke Program Early CT Score) - Ganglionic level infarction (caudate, lentiform nuclei, internal capsule, insula, M1-M3 cortex): 7 - Supraganglionic infarction (M4-M6 cortex): 3 Total score (0-10 with 10  being normal): 10 IMPRESSION: 1. No acute intracranial abnormality identified. 2. Stable chronic microvascular ischemic changes and volume loss of the brain. 3. ASPECTS is 10 These results were called by telephone at the time of interpretation on 10/29/2017 at 4:35 am to Dr. Veryl Speak , who verbally acknowledged these results. Electronically Signed   By: Kristine Garbe M.D.   On: 10/29/2017 04:39    Micro Results   No results found for this or any previous visit (from the past 240 hour(s)).  Today   Subjective    Nolen Mu today has no new complaints, no chest pains no palpitations eating or drinking well no shortness of breath          Patient has been seen and examined prior to discharge   Objective   Blood pressure (!) 146/90, pulse 84, temperature 97.7 F (36.5 C), temperature source Oral, resp. rate 16, height 5\' 1"  (1.549 m), weight 99.8 kg, SpO2 98 %.   Intake/Output Summary (Last 24 hours) at 11/01/2017 1236 Last data filed at 11/01/2017 9629 Gross per 24 hour  Intake 483 ml  Output 1800 ml  Net -1317 ml    Exam Patient is examined daily including today on 11/01/17 , exams remain the same as of yesterday except that has changed   Gen:- Awake Alert,  In no apparent distress  HEENT:- [place].AT, No sclera icterus, facial asymmetry persist Neck-Supple Neck,No JVD,.  Lungs-  CTAB , good air movement bilaterally CV- S1, S2 normal Abd-  +ve B.Sounds, Abd Soft, No tenderness,    Extremity/Skin:- No  edema,   good pulses Psych-affect is appropriate, oriented x3 Neuro/ Neurologial Exam:- Neurological -dense right-sided hemiplegia persists, right lower extremity is pretty weak with strength of 1/5, right upper extremities 1/5 extremities Mental Status: Patient is awake, alert, oriented to person, place, month, year, and situation. Patient is able to give a clear and coherent history. No signs of aphasia or neglect Cranial Nerves: II: Visual Fields are full.  Pupils are equal, round, and reactive to light.  III,IV, VI: EOMI without ptosis or diploplia.  V: Facial sensation issymmetrical and wnl VII: Facial movement is symmetric.  VIII: hearing is intact to voice X: Uvula elevates symmetrically XI: Shoulder shrug is symmetric. XII: tongue is midline without atrophy or fasciculations.  Motor: -dense right-sided hemiplegia persists, right lower extremity is pretty weak with strength of 1/5, right upper extremities 1/5 extremities Sensory: Sensation isdiminished in the right upper and right lower extremity topinprick and to touch Cerebellar: FNFwithout ataxia   Data Review   CBC w Diff:  Lab Results  Component Value Date   WBC 9.1 10/30/2017   HGB 12.7 10/30/2017   HCT 39.7 10/30/2017   PLT 248 10/30/2017   LYMPHOPCT 47 10/29/2017   MONOPCT 7 10/29/2017   EOSPCT 5 10/29/2017   BASOPCT 0 10/29/2017    CMP:  Lab Results  Component Value Date   NA 141 10/31/2017   K 3.6 10/31/2017   CL 111 10/31/2017   CO2 23 10/31/2017   BUN 16 10/31/2017   CREATININE 1.28 (H) 10/31/2017   PROT 7.3 10/29/2017   ALBUMIN 3.6 10/29/2017   BILITOT 0.5 10/29/2017   ALKPHOS 77 10/29/2017   AST 13 (L) 10/29/2017   ALT 8 10/29/2017  .   Total Discharge time is about 33 minutes  Roxan Hockey M.D on 11/01/2017 at 12:36 PM  Pager---[phone]  Go to [email or web address] - password [email or web address] for contact info  [facility] - Office  [phone]

## 2017-11-01 NOTE — Progress Notes (Signed)
[facility] called by Network engineer for transport this morning.  Several calls made in addition to let them know that patient had to be at [facility] by 5pm.  Son decided to transport patient himself thinking [facility] would not be here in time.   Assisted to wheelchair with gaitbelt and assist fo two.  Transported to car. Call made to [facility] to let them know that patient is on her way.Son has packet of orders and knows to give it to nurse.  Spoke with Levada Dy at [facility] to let her know the situation

## 2017-11-01 NOTE — Progress Notes (Signed)
Report called to [facility] and CIR.  IV removed and patient assisted back to bed with max assist.  Continues to be unable to move right arm and leg.

## 2017-11-01 NOTE — Discharge Instructions (Signed)
1)Restart Eliquis 5 mg twice daily on 11/01/2017 2)Transfer to inpatient rehab for acute stroke with right-sided hemiplegi

## 2017-11-01 NOTE — PMR Pre-admission (Signed)
[place] PMR Admission Coordinator Pre-Admission Assessment  Patient: Holly Flores is an 72 y.o., female MRN: [id number] DOB: 1945/09/16 Height: 5\' 1"  (154.9 cm) Weight: 99.8 kg  Insurance Information HMO:     PPO:      PCP:      IPA:      80/20:      OTHER:  PRIMARY: Medicare A & B      Policy#: [id number]      Subscriber: Self Pre-Cert#: Eligible       Employer: Retired Benefits:  Phone #: Verified online     Name: [employer]  Eff. Date: A:12/18/91, B:07/17/92     Deduct: $1364      Out of Pocket Max: N/A      Life Max: N/A CIR: 100%      SNF: 100% days 1-20; 80% days 21-100 Outpatient: 80%     Co-Pay: 20% Home Health: 100%      Co-Pay: $0 DME: 80%     Co-Pay: 20% Providers: Patient's Choice   SECONDARY11-16-1996       Policy#: Lurena Joiner      Subscriber: Self Employer: Retired    Benefits:  Phone #: [phone]      Emergency [place]    Name Petoskey E Spouse [phone]  [phone]   Mercia, Dowe   Costella Hatcher   [phone] Daughter   [phone]   Aylssa, Herrig Granddaughter   [phone]      Current Medical History  Patient Admitting Diagnosis: Acute infarct left external capsule posteriorly   History of Present Illness: Holly Flores is a 72 year old right-handed African-American female with history of diastolic congestive heart failure, PAF maintained on Eliquis, CKD stage II, COPD, hypertension, hyperlipidemia.  Per chart review patient lives with spouse.  Independent prior to admission and driving.  One level home with 3 steps to entry.  Presented 10/29/2017 to [facility] with right sided weakness, diplopia and difficulty speaking.  Troponin negative.  CT/MRI showed acute infarction left external capsule posteriorly.  Chronic microhemorrhage left frontal white matter adjacent to the head of the caudate.  CT angiogram of head and neck with no large  vessel occlusion.  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  Echocardiogram with ejection fraction of 65% no wall motion abnormalities.  Patient did not receive TPA.  Follow-up neurology services.  Patient's chronic Eliquis initially held to avoid hemorrhagic transformation and was initially placed on aspirin therapy for 3 days and then Eliquis resumed 11/01/2017 and aspirin at that time discontinued.  Presently on a mechanical soft diet with thin liquids.  Therapy evaluations completed.  Patient was admitted for a comprehensive rehab program 11/01/17.   Patient's medical record from [facility] has been reviewed by the rehabilitation admission coordinator and physician.  NIH Stroke scale: 13  Past Medical History  Past Medical History:  Diagnosis Date  . Anxiety   . Arthritis   . CHF (congestive heart failure) ([place])   . COPD (chronic obstructive pulmonary disease) ([place])   . Depression   . GERD (gastroesophageal reflux disease)   . Goiter   . Hyperlipidemia   . Hypertension   . Hypothyroidism   . Kidney cysts    left side  . Myocardial infarction ([place])    8 yrs ago.   [address] Shortness of breath   . Sleep apnea  uses CPAP, 3  . Stroke ([place])   . 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    Family History   family history includes Alcoholism in her other; Asthma in her other; Cancer in her other; Diabetes in her other; Heart disease in her other; Hypercholesterolemia in her other; Hypertension in her other; Rheumatologic disease in her other; Stroke in her other; Thyroid disease in her other.  Prior Rehab/Hospitalizations Has the patient had major surgery during 100 days prior to admission? No   Current Medications Refer to [facility]   Patients Current Diet:  Dys.3 textures and thin liquids   Precautions / Restrictions Precautions Precautions: Fall Precautions/Special Needs:  Swallowing Restrictions Weight Bearing Restrictions: No   Has the patient had 2 or more falls or a fall with injury in the past year?Yes  Prior Activity Level Community (5-7x/wk): Prior to admission patient was fully independent at home, out daily, and driving.  She reports using a cane or wheelchair of she was out in the community for long distances.    Prior Functional Level Self Care: Did the patient need help bathing, dressing, using the toilet or eating? Independent  Indoor Mobility: Did the patient need assistance with walking from room to room (with or without device)? Independent  Stairs: Did the patient need assistance with internal or external stairs (with or without device)? Independent  Functional Cognition: Did the patient need help planning regular tasks such as shopping or remembering to take medications? Independent  Home Equities trader / Equipment Home Assistive Devices/Equipment: Wheelchair, Environmental consultant (specify type), Kasandra Knudsen (specify quad or straight), Shower chair with back Home Equipment: Cane - single point, Environmental consultant - 2 wheels, Shower seat, Bedside commode, Wheelchair - manual  Prior Device Use: Indicate devices/aids used by the patient prior to current illness, exacerbation or injury? Cane or wheelchair depending on distance in the community only.   Prior Functional Level Current Functional Level  Bed Mobility  Independent   Max A   Transfers  Independent   Max A   Mobility - Walk/Wheelchair  Independent   Max A   Upper Body Dressing  Independent   Mod A   Lower Body Dressing  Independent   Max A   Grooming  Independent   Min A   Eating/Drinking  Independent   Min A   Toilet Transfer  Independent   Max A   Bladder Continence   Yes    Continent    Bowel Management  Continent    Continent    Stair Climbing  Independent    TBA   Communication  Independent    Min A   Memory  Independent    TBA   Cooking/Meal Prep  Brewing technologist  Independent       Special needs/care consideration BiPAP/CPAP: Yes, 2L O2 or CPAP at night  CPM: No Continuous Drip IV: No Dialysis: No         Life Vest: No Oxygen: No Special Bed: No Trach Size: No Wound Vac (area): No       Skin: WDL                               Bowel mgmt: 11/01/17 Diarrhea documented  Bladder mgmt: Continent documented, but also noted  use of external catheter  Diabetic mgmt: No  Previous Home Environment Living Arrangements: Spouse/significant other  Lives With: Spouse Available Help at Discharge: Family Type of Home: House Home Layout: One level Home Access: Stairs to enter Entrance Stairs-Rails: None Entrance Stairs-Number of Steps: 3 steps onto porch, 2 steps into [employer] Shower/Tub: Tub/shower unit, Architectural technologist: Standard Bathroom Accessibility: Yes How Accessible: Accessible via walker [place]: No  Discharge Living Setting Plans for Discharge Living Setting: Patient's home, Lives with (comment)(Spouse and son next door) Type of Home at Discharge: House Discharge Home Layout: One level Discharge Home Access: Stairs to enter Entrance Stairs-Rails: None Entrance Stairs-Number of Steps: 5 Discharge Bathroom Shower/Tub: Tub/shower unit, Curtain Discharge Bathroom Toilet: Standard Discharge Bathroom Accessibility: Yes How Accessible: Accessible via walker Does the patient have any problems obtaining your medications?: No  Social/Family/Support Systems Patient Roles: Spouse, Parent, Other (Comment)(Grandparent ) Contact Information: see above  Anticipated Caregiver: Spouse, daughter, granddaughter  Anticipated Ambulance person Information: see above  Ability/Limitations of Caregiver: Spouse can do light assist  Caregiver Availability: 24/7 Discharge Plan Discussed with Primary Caregiver: Yes Is Caregiver In Agreement with Plan?:  Yes Does Caregiver/Family have Issues with Lodging/Transportation while Pt is in Rehab?: No  Goals/Additional Needs Patient/Family Goal for Rehab: SLP: Mod I; PT/OT: Min A Expected length of stay: 15-20 days  Cultural Considerations: None Dietary Needs: Dys.3 textures and thin liquids  Equipment Needs: TBD Pt/Family Agrees to Admission and willing to participate: Yes Program Orientation Provided & Reviewed with Pt/Caregiver Including Roles  & Responsibilities: Yes  Patient Condition: I have reviewed medical records from [facility], spoken with CM, patient, and her son. I discussed via phone with patient and son for inpatient rehabilitation assessment.  Patient will benefit from ongoing PT, OT, and SLP, can actively participate in 3 hours of therapy a day 5 days of the week, and can make measurable gains during the admission.  Patient will also benefit from the coordinated team approach during an Inpatient Acute Rehabilitation admission.  The patient will receive intensive therapy as well as Rehabilitation physician, nursing, social worker, and care management interventions.  Due to bowel management, bladder management, safety, disease management, medical administration, pain management, patient education the patient requires 24 hour a day rehabilitation nursing.  The patient is currently Max A with transfers and mobility and basic ADLs.  Discharge setting and therapy post discharge at home with home health is anticipated.  Patient has agreed to participate in the Acute Inpatient Rehabilitation Program and will admit today, 11/01/17.      Preadmission Screen Completed By:  Jamse Arn, 11/01/2017 11:59 AM ______________________________________________________________________   Discussed status with Dr. Posey Pronto on 11/01/17 at 1145 and received telephone approval for admission today.  Admission Coordinator:  Ankit Lorie Phenix, time 1145/Date 11/01/17   Assessment/Plan: Diagnosis:  Acute infarct left  external capsule posteriorly   1. Does the need for close, 24 hr/day  Medical supervision in concert with the patient's rehab needs make it unreasonable for this patient to be served in a less intensive setting? Yes  2. Co-Morbidities requiring supervision/potential complications: diastolic congestive heart failure, PAF maintained on Eliquis, CKD stage II, COPD, hypertension, hyperlipidemia.   3. Due to safety, disease management and patient education, does the patient require 24 hr/day rehab nursing? Yes 4. Does the patient require coordinated care of a physician, rehab nurse, PT (1-2 hrs/day, 5 days/week) and OT (1-2 hrs/day, 5 days/week) to address physical and functional deficits in the context  of the above medical diagnosis(es)? Yes Addressing deficits in the following areas: balance, endurance, locomotion, strength, transferring, bowel/bladder control, bathing, dressing, feeding, grooming, toileting and psychosocial support 5. Can the patient actively participate in an intensive therapy program of at least 3 hrs of therapy 5 days a week? Yes 6. The potential for patient to make measurable gains while on inpatient rehab is excellent 7. Anticipated functional outcomes upon discharge from inpatients are: min assist and mod assist PT, min assist and mod assist OT, n/a SLP 8. Estimated rehab length of stay to reach the above functional goals is: 19-24 days. 9. Does the patient have adequate social supports to accommodate these discharge functional goals? Yes 10. Anticipated D/C setting: Home 11. Anticipated post D/C treatments: HH therapy and Home excercise program 12. Overall Rehab/Functional Prognosis: good    RECOMMENDATIONS: This patient's condition is appropriate for continued rehabilitative care in the following setting: CIR Patient has agreed to participate in recommended program. Yes Note that insurance prior authorization may be required for reimbursement for recommended care.  Ankit  Lorie Phenix 11/01/2017

## 2017-11-01 NOTE — Care Management Important Message (Signed)
Important Message  Patient Details  Name: Holly Flores MRN: [id number] Date of Birth: 1945/03/13   Medicare Important Message Given:  Yes    Shelda Altes 11/01/2017, 10:32 AM

## 2017-11-01 NOTE — Progress Notes (Signed)
Inpatient Rehabilitation  Received consult request and discussed case with nurse case manager.  Called and spoke with patient and son, Lennette Bihari via phone to discuss team's recommendation for IP Rehab.  Reviewed program expectations, estimated length of stay, and anticipated outcomes.  Also reviewed insurance verification letter and answered questions.  They are eager for patient to participate in the IP Rehab program.  I have a bed available to offer today and plan to proceed with admission.  Call if questions.   Carmelia Roller., CCC/SLP Admission Coordinator  [place]  Cell [phone]

## 2017-11-01 NOTE — Care Management Note (Signed)
Case Management Note  Patient Details  Name: Holly Flores MRN: [id number] Date of Birth: February 25, 1945    Admitted with acute CVA. Pt from home, ind pta. Son at bedside. Pt has been accepted at CIR. Pt to DC today and transport via [facility]. CM has verified be offer with CIR rep, Melissa, and RN will call report.                    Expected Discharge Date:      11/01/17            Expected Discharge Plan:  [place]  In-House Referral:  Clinical Social Work  Discharge planning Services  CM Consult  Post Acute Care Choice:  IP Rehab Choice offered to:  Patient  Status of Service:  Completed, signed off  If discussed at [employer], dates discussed:    Additional Comments:  Sherald Barge, RN 11/01/2017, 12:18 PM

## 2017-11-01 NOTE — NC FL2 (Signed)
[place] MEDICAID FL2 LEVEL OF CARE SCREENING TOOL     IDENTIFICATION  Patient Name: Holly Flores Birthdate: 12-01-1945 Sex: female Admission Date (Current Location): 10/29/2017  [facility] and [place] Number:  [employer] and Address:  [place] [address] [place]      Provider Number: [id number]  Attending Physician Name and Address:  Roxan Hockey, MD  Relative Name and Phone Number:       Current Level of Care: Hospital Recommended Level of Care: [place] Prior Approval Number:    Date Approved/Denied:   PASRR Number:    Discharge Plan: SNF    Current Diagnoses: Patient Active Problem List   Diagnosis Date Noted  . COPD (chronic obstructive pulmonary disease) ([place]) 10/29/2017  . Chronic diastolic CHF (congestive heart failure) ([place]) 10/29/2017  . Hypothyroidism 10/29/2017  . Essential hypertension 10/29/2017  . GERD (gastroesophageal reflux disease) 10/29/2017  . Anxiety 10/29/2017  . Taking medication for chronic disease 05/18/2017  . Central retinal vein occlusion of left eye 01/22/2015  . Degenerative disc disease, cervical 01/22/2015  . HA (headache) 12/30/2014  . CVA (cerebral vascular accident) ([place]) 12/30/2014  . CVA (cerebral infarction) 12/30/2014  . History of colonic polyps 05/14/2014  . 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 Chest pain 04/20/2013  . Dyspnea 04/20/2013  . Hyperlipidemia 04/20/2013    Orientation RESPIRATION BLADDER Height & Weight     Self, Time, Situation, Place  Normal Continent Weight: 220 lb 0.3 oz (99.8 kg) Height:  5\' 1"  (154.9 cm)  BEHAVIORAL SYMPTOMS/MOOD NEUROLOGICAL BOWEL NUTRITION STATUS      Continent Diet  AMBULATORY STATUS COMMUNICATION OF NEEDS Skin   Extensive Assist Verbally Normal                       Personal Care Assistance Level of Assistance  Bathing, Feeding, Dressing Bathing Assistance: Limited  assistance Feeding assistance: Limited assistance Dressing Assistance: Limited assistance     Functional Limitations Info  Sight, Hearing, Speech Sight Info: Adequate Hearing Info: Adequate Speech Info: Adequate    SPECIAL CARE FACTORS FREQUENCY  PT (By licensed PT), Speech therapy     PT Frequency: 5x/week       Speech Therapy Frequency: 3x/week      Contractures Contractures Info: Not present    Additional Factors Info  Code Status, Allergies, Psychotropic Code Status Info: Full Code Allergies Info: Penicillins, Sulfa Antibiotics Psychotropic Info: Xanax         Current Medications (11/01/2017):  This is the current hospital active medication list Current Facility-Administered Medications  Medication Dose Route Frequency Provider Last Rate Last Dose  . 0.9 %  sodium chloride infusion  250 mL Intravenous PRN Emokpae, Courage, MD      . acetaminophen (TYLENOL) tablet 650 mg  650 mg Oral Q6H PRN Denton Brick, Courage, MD   650 mg at 10/31/17 2124   Or  . acetaminophen (TYLENOL) suppository 650 mg  650 mg Rectal Q6H PRN Emokpae, Courage, MD      . albuterol (PROVENTIL) (2.5 MG/3ML) 0.083% nebulizer solution 2.5 mg  2.5 mg Nebulization Q2H PRN Emokpae, Courage, MD      . ALPRAZolam Duanne Moron) tablet 1 mg  1 mg Oral QHS Emokpae, Courage, MD   1 mg at 10/31/17 2124  . aspirin tablet 325 mg  325 mg Oral Daily Emokpae, Courage, MD   325 mg at 11/01/17 0801  . atorvastatin (LIPITOR) tablet 80  mg  80 mg Oral Daily Roxan Hockey, MD   80 mg at 11/01/17 0802  . heparin injection 5,000 Units  5,000 Units Subcutaneous Q8H Roxan Hockey, MD   5,000 Units at 11/01/17 0551  . hydrALAZINE (APRESOLINE) injection 10 mg  10 mg Intravenous Q6H PRN Emokpae, Courage, MD      . levothyroxine (SYNTHROID, LEVOTHROID) tablet 50 mcg  50 mcg Oral QAC breakfast Denton Brick, Courage, MD   50 mcg at 11/01/17 0802  . omega-3 acid ethyl esters (LOVAZA) capsule 2 g  2 g Oral Daily Emokpae, Courage, MD   2 g at  11/01/17 0801  . ondansetron (ZOFRAN) tablet 4 mg  4 mg Oral Q6H PRN Emokpae, Courage, MD       Or  . ondansetron (ZOFRAN) injection 4 mg  4 mg Intravenous Q6H PRN Emokpae, Courage, MD      . oxyCODONE-acetaminophen (PERCOCET/ROXICET) 5-325 MG per tablet 1 tablet  1 tablet Oral Q4H PRN Emokpae, Courage, MD       And  . oxyCODONE (Oxy IR/ROXICODONE) immediate release tablet 5 mg  5 mg Oral Q4H PRN Emokpae, Courage, MD      . pantoprazole (PROTONIX) EC tablet 40 mg  40 mg Oral Daily Emokpae, Courage, MD   40 mg at 11/01/17 0802  . polyethylene glycol (MIRALAX / GLYCOLAX) packet 17 g  17 g Oral Daily PRN Emokpae, Courage, MD      . polyvinyl alcohol (LIQUIFILM TEARS) 1.4 % ophthalmic solution 1 drop  1 drop Both Eyes PRN Emokpae, Courage, MD      . potassium chloride SA (K-DUR,KLOR-CON) CR tablet 20 mEq  20 mEq Oral Daily Emokpae, Courage, MD   20 mEq at 11/01/17 0801  . pyridOXINE (VITAMIN B-6) tablet 100 mg  100 mg Oral Daily Emokpae, Courage, MD   100 mg at 11/01/17 0802  . sodium chloride flush (NS) 0.9 % injection 3 mL  3 mL Intravenous Q12H Emokpae, Courage, MD   3 mL at 11/01/17 0804  . sodium chloride flush (NS) 0.9 % injection 3 mL  3 mL Intravenous PRN Emokpae, Courage, MD      . topiramate (TOPAMAX) tablet 100 mg  100 mg Oral BID Denton Brick, Courage, MD   100 mg at 11/01/17 0803  . torsemide (DEMADEX) tablet 20 mg  20 mg Oral Daily Emokpae, Courage, MD   20 mg at 11/01/17 0802  . traZODone (DESYREL) tablet 50 mg  50 mg Oral QHS PRN Emokpae, Courage, MD      . Vitamin D (Ergocalciferol) (DRISDOL) capsule 50,000 Units  50,000 Units Oral Once per day on Mon Thu Emokpae, Courage, MD   50,000 Units at 11/01/17 0802     Discharge Medications: Please see discharge summary for a list of discharge medications.  Relevant Imaging Results:  Relevant Lab Results:   Additional Information SSN [id number]. Patient has right sided hemiplegia.  Sukhraj Esquivias, Clydene Pugh, LCSW

## 2017-11-01 NOTE — Clinical Social Work Note (Signed)
Patient going to CIR. LCSW signing off.     Aarib Pulido, Clydene Pugh, LCSW

## 2017-11-01 NOTE — Progress Notes (Signed)
Physical Therapy Treatment Patient Details Name: Holly Flores MRN: [id number] DOB: 06-29-1945 Today's Date: 11/01/2017    History of Present Illness Patient is a 72 year old female with past medical history of CHF, COPD, hypertension, coronary artery disease.  She presents today for evaluation of weakness in her right arm and right leg.  She states that she was in her normal state of health, however when she woke up at 3:30 AM she was having difficulty moving her right arm and right leg.  911 was called and the patient was brought here as a code stroke.  She denies any headache.  She denies any injury or trauma.  She is currently taking Eliquis.    PT Comments       Follow Up Recommendations    Pt in bed with son present.  Attempted therex with Rt UE/LE, however patient without active motion in either limb.  Required min-mod assist to transfer supine to sit and able to maintain seated balance with Lt UE assistance.  Stand-pivot transfer completed to chair with mod-max assist and cues.  Pt positioned to sit more upright in chair.  Informed nursing that pure wick needed to be replaced and was informed [facility] was on the way to get patient to transport to rehab.  Was asked to transfer back to bed, however patient was eating lunch.  Discussed with nursing assistance and she will get her back to bed following lunch.           Precautions / Restrictions Precautions Precautions: Fall Restrictions Weight Bearing Restrictions: No    Mobility  Bed Mobility Overal bed mobility: Needs Assistance Bed Mobility: Supine to Sit     Supine to sit: Mod assist;Max assist     General bed mobility comments: unable to use Rt side at this time, labored movement and slow mobilty requiring cues  Transfers Overall transfer level: Needs assistance Equipment used: None Transfers: Stand Pivot Transfers   Stand pivot transfers: Mod assist;Max assist       General transfer comment: Pt able to weight  bear through Rt LE, however cannot physically move to assist with mobility.  Mod-max assist to transfer to chair with cues for UE placement.         Cognition Arousal/Alertness: Awake/alert Behavior During Therapy: WFL for tasks assessed/performed Overall Cognitive Status: Within Functional Limits for tasks assessed                                               Pertinent Vitals/Pain Pain Assessment: No/denies pain    Home Living   Living Arrangements: Spouse/significant other Available Help at Discharge: Family Type of Home: House Home Access: Stairs to enter Entrance Stairs-Rails: None Home Layout: One level        Prior Function Level of Independence: Independent      Comments: community ambulator, drives   PT Goals (current goals can now be found in the care plan section) Progress towards PT goals: Progressing toward goals    Frequency    7X/week      PT Plan  continue per POC.  Pt expected to be discharged to inpatient rehab today.       AM-PAC PT "6 Clicks" Daily Activity  Outcome Measure  Difficulty turning over in bed (including adjusting bedclothes, sheets and blankets)?: A Lot Difficulty moving from lying on back to sitting on  the side of the bed? : A Lot Difficulty sitting down on and standing up from a chair with arms (e.g., wheelchair, bedside commode, etc,.)?: A Lot Help needed moving to and from a bed to chair (including a wheelchair)?: A Lot Help needed walking in hospital room?: Total Help needed climbing 3-5 steps with a railing? : Total 6 Click Score: 10    End of Session Equipment Utilized During Treatment: Gait belt Activity Tolerance: Patient tolerated treatment well;Patient limited by fatigue Patient left: with call bell/phone within reach;in chair;with family/visitor present;with chair alarm set   PT Visit Diagnosis: Other abnormalities of gait and mobility (R26.89);Unsteadiness on feet (R26.81);Muscle weakness  (generalized) (M62.81)     Time: 3143-8887 PT Time Calculation (min) (ACUTE ONLY): 28 min  Charges:  $Therapeutic Exercise: 23-37 mins                     Teena Irani, PTA/CLT [place], Liridona Mashaw B 11/01/2017, 11:54 AM

## 2017-11-01 NOTE — Progress Notes (Signed)
Called to check on status of transport. Patient still waiting for transport via [facility]. Continuation of care for Rehab

## 2017-11-01 NOTE — Progress Notes (Signed)
Patient ID: Holly Flores, female   DOB: 30-Mar-1945, 72 y.o.   MRN: [id number] Admit to unit via w/c from car. Oriented to unit , plan of care and rehab schedule. PT and family report an understanding of information. Margarito Liner

## 2017-11-01 NOTE — H&P (Signed)
Physical Medicine and Rehabilitation Admission H&P    Chief Complaint  Patient presents with  . Code Stroke  : HPI: Holly Flores is a 72 year old right-handed African-American female with history of diastolic congestive heart failure, PAF maintained on Eliquis since July 2019 per Dr. Carlyle Dolly, CKD stage II, COPD, hypertension, hyperlipidemia.  Per chart review, patient, and husband, patient lives with spouse.  Independent prior to admission and driving.  One level home with 3 steps to entry.  Good support of extended family. Husabnd is receiving treatment for leukemia at [place] but can assist.  Presented 10/29/2017 to [facility] with right sided weakness, diplopia and difficulty speaking.  Troponin negative.  MRI reviewed, showing left brain infarct.  Per CT and MRI report, acute infarction left external capsule posteriorly.  Chronic microhemorrhage left frontal white matter adjacent to the head of the caudate.  CT angiogram of head and neck with no large vessel occlusion.  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  Echocardiogram with ejection fraction of 65% no wall motion abnormalities without thrombus.  Patient did not receive TPA.  Follow-up neurology services.  Patient's chronic Eliquis initially held to avoid hemorrhagic transformation and was initially placed on aspirin therapy for 3 days and then Eliquis resumed 11/01/2017 and aspirin at that time discontinued.  Presently on a mechanical soft diet with thin liquids.  Therapy evaluations completed.  Patient was admitted for a comprehensive rehab program.  Review of Systems  Constitutional: Negative for chills and fever.  HENT: Negative for hearing loss.   Eyes: Positive for double vision. Negative for blurred vision.  Respiratory: Positive for shortness of breath.   Cardiovascular: Positive for leg swelling.  Gastrointestinal: Positive for  constipation. Negative for nausea and vomiting.       GERD  Genitourinary: Negative for dysuria, flank pain and hematuria.  Musculoskeletal: Positive for back pain and myalgias.  Skin: Negative for rash.  Neurological: Positive for sensory change, speech change, focal weakness and weakness.  Psychiatric/Behavioral: Positive for depression.       Anxiety  All other systems reviewed and are negative.  Past Medical History:  Diagnosis Date  . Anxiety   . Arthritis   . CHF (congestive heart failure) ([place])   . COPD (chronic obstructive pulmonary disease) ([place])   . Depression   . GERD (gastroesophageal reflux disease)   . Goiter   . Hyperlipidemia   . Hypertension   . Hypothyroidism   . Kidney cysts    left side  . Myocardial infarction ([place])    8 yrs ago.   Marland Kitchen Shortness of breath   . Sleep apnea    uses CPAP, 3  . Stroke ([place])   . 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   Past Surgical History:  Procedure Laterality Date  . ABDOMINAL HYSTERECTOMY    . BACK SURGERY     x3,cervical and lunmbar, disc.  Marland Kitchen BREAST BIOPSY     left-non cancerous  . CARDIAC CATHETERIZATION    . CARPAL TUNNEL RELEASE Left   . CATARACT EXTRACTION W/PHACO  09/21/2011   Procedure: CATARACT EXTRACTION PHACO AND INTRAOCULAR LENS PLACEMENT (IOC);  Surgeon: Williams Che, MD;  Location: [facility];  Service: Ophthalmology;  Laterality: Left;  CDE:9.78  . CATARACT EXTRACTION W/PHACO Right 10/15/2014   Procedure: CATARACT EXTRACTION PHACO AND INTRAOCULAR LENS PLACEMENT ([place]);  Surgeon: Debe Coder  Iona Hansen, MD;  Location: [facility];  Service: Ophthalmology;  Laterality: Right;  CDE:4.19  . CERVICAL SPINE SURGERY    . CESAREAN SECTION     x2  . CHOLECYSTECTOMY    . COLONOSCOPY  2006   RMR: 1. Internal hemorroids, otherwise normal rectum. 2. Pedunculated polyp at 35 cm. reomved with snare. The remainder of teh colonic mucosa appeared normal.   . COLONOSCOPY  2011   Dr. Gala Romney: multiple ascending colon polyps and  rectal polyp, adenomatous  . COLONOSCOPY N/A 05/31/2014   Procedure: COLONOSCOPY;  Surgeon: Daneil Dolin, MD;  Location: [facility];  Service: Endoscopy;  Laterality: N/A;  130  . COLONOSCOPY WITH PROPOFOL N/A 06/24/2017   Procedure: COLONOSCOPY WITH PROPOFOL;  Surgeon: Daneil Dolin, MD;  [facility];  Service: Endoscopy;  Laterality: N/A;  2:15pm  . ESOPHAGOGASTRODUODENOSCOPY  2006   Dr. Gala Romney: Subtle Schatzkis ring, otherwise normal upper GI tract, aside from a small pyloric channell erosion, status post dilation as described above.   Marland Kitchen KNEE SURGERY Bilateral   . POLYPECTOMY  06/24/2017   Procedure: POLYPECTOMY;  Surgeon: Daneil Dolin, MD;  Location[facility];  Service: Endoscopy;;  ascending  . TONSILLECTOMY    . TOTAL KNEE ARTHROPLASTY Right 05/24/2013   DR MURPHY  . TOTAL KNEE ARTHROPLASTY Right 05/24/2013   Procedure: TOTAL KNEE ARTHROPLASTY;  Surgeon: [name], MD;  Location: [facility];  Service: Orthopedics;  Laterality: Right;   Family History  Problem Relation Age of Onset  . Cancer Other   . Diabetes Other   . Heart disease Other   . Hypertension Other   . Asthma Other   . Alcoholism Other   . Thyroid disease Other   . Rheumatologic disease Other   . Stroke Other   . Hypercholesterolemia Other   . Colon cancer Neg Hx    Social History:  reports that she quit smoking about 30 years ago. Her smoking use included cigarettes. She has a 5.00 pack-year smoking history. She has never used smokeless tobacco. She reports that she drank alcohol. She reports that she does not use drugs. Allergies:  Allergies  Allergen Reactions  . Penicillins Hives, Itching and Rash    Has patient had a PCN reaction causing immediate rash, facial/tongue/throat swelling, SOB or lightheadedness with hypotension: Yes Has patient had a PCN reaction causing severe rash involving mucus membranes or skin necrosis: Yes Has patient had a PCN reaction that required hospitalization:  No Has patient had a PCN reaction occurring within the last 10 years: No If all of the above answers are "NO", then may proceed with Cephalosporin use.   . Sulfa Antibiotics Swelling    Tongue swelling   Medications Prior to Admission  Medication Sig Dispense Refill  . ALPRAZolam (XANAX) 1 MG tablet Take 1 mg by mouth at bedtime.    Marland Kitchen apixaban (ELIQUIS) 5 MG TABS tablet Take 1 tablet (5 mg total) by mouth 2 (two) times daily. 60 tablet 3  . levothyroxine (LEVOTHROID) 50 MCG tablet Take 50 mcg by mouth daily before breakfast.    . OVER THE COUNTER MEDICATION Apply 1 application topically 3 (three) times daily as needed (for knee pain.). TWO OLD GOATS ESSENTIAL LOTION FOR ACHES & PAINS    . oxyCODONE-acetaminophen (ENDOCET) 10-325 MG tablet Take 1 tablet by mouth every 4 (four) hours as needed for pain.    Vladimir Faster Glycol-Propyl Glycol (LUBRICANT EYE DROPS) 0.4-0.3 % SOLN Place 1 drop into  both eyes 3 (three) times daily as needed (for dry eyes).    . potassium chloride SA (K-DUR,KLOR-CON) 20 MEQ tablet Take 20 mEq by mouth daily.    Marland Kitchen torsemide (DEMADEX) 20 MG tablet Take 40 mg by mouth daily.    Marland Kitchen albuterol (PROVENTIL HFA;VENTOLIN HFA) 108 (90 Base) MCG/ACT inhaler Inhale 2 puffs into the lungs every 6 (six) hours as needed (for wheezing/shortness of breath).     . esomeprazole (NEXIUM) 40 MG capsule Take 40 mg by mouth daily as needed (heartburn). For heartburn    . Vitamin D, Ergocalciferol, (DRISDOL) 50000 UNITS CAPS Take 50,000 Units by mouth 2 (two) times a week. On Tuesdays and Thursdays      Drug Regimen Review Drug regimen was reviewed and remains appropriate with no significant issues identified  Home: Home Living Family/patient expects to be discharged to:: Private residence Living Arrangements: Spouse/significant other Available Help at Discharge: Family Type of Home: House Home Access: Stairs to enter [employer] of Steps: 3 steps onto porch, 2 steps into  [employer]: None Home Layout: One level Bathroom Shower/Tub: Public librarian, Architectural technologist: Programmer, systems: Yes Home Equipment: Radio producer - single point, Environmental consultant - 2 wheels, Shower seat, Bedside commode, Wheelchair - manual  Lives With: Spouse   Functional History: Prior Function Level of Independence: Independent Comments: Hydrographic surveyor, drives  Functional Status:  Mobility: Bed Mobility Overal bed mobility: Needs Assistance Bed Mobility: Supine to Sit, Sit to Supine Supine to sit: Max assist Sit to supine: Max assist General bed mobility comments: slow labored movement Transfers Overall transfer level: Needs assistance Equipment used: Hemi-walker Transfers: Sit to/from Stand, Technical brewer Transfers Sit to Stand: Max assist Stand pivot transfers: +2 physical assistance General transfer comment: Used HW for sit to stand transfer with max assistance of 1; stand pivot transfer with therapist and nurse max A without assistive device to bedside chair.  Ambulation/Gait Ambulation/Gait assistance: Max assist Gait Distance (Feet): 2 Feet Assistive device: Hemi-walker Gait Pattern/deviations: Decreased step length - right, Decreased stance time - right, Decreased stride length General Gait Details: limited to 3-4 shuffling side steps with Max tactile assist to move RLE at bedside, unsafe to attempt taking steps away from bedside. Assistance provided by therapist, nurse stand by assist.  Gait velocity: slow    ADL: ADL Overall ADL's : Needs assistance/impaired Functional mobility during ADLs: Maximal assistance General ADL Comments: patient unable to use her dominant right hand with any functional activities.  patient will need education on compensatory activities for adl independence.   Cognition: Cognition Overall Cognitive Status: Within Functional Limits for tasks assessed Arousal/Alertness: Awake/alert Orientation Level: Oriented  X4 Memory: Appears intact Awareness: Appears intact Problem Solving: Appears intact Safety/Judgment: Appears intact Cognition Arousal/Alertness: Awake/alert Behavior During Therapy: WFL for tasks assessed/performed Overall Cognitive Status: Within Functional Limits for tasks assessed  Physical Exam: Blood pressure (!) 146/90, pulse 84, temperature 97.7 F (36.5 C), temperature source Oral, resp. rate 16, height '5\' 1"'  (1.549 m), weight 99.8 kg, SpO2 98 %. Physical Exam  Vitals reviewed. Constitutional: She is oriented to person, place, and time. She appears well-developed.  Morbid obesity  HENT:  Head: Normocephalic and atraumatic.  Eyes: Right eye exhibits no discharge. Left eye exhibits no discharge.  Pupils reactive to light Makes poor eye contact  Neck: Normal range of motion. Neck supple. No thyromegaly present.  Cardiovascular: Normal rate.  + Tachycardia  Respiratory: Effort normal and breath sounds normal. No respiratory distress.  GI: Soft.  Bowel sounds are normal. She exhibits no distension.  Musculoskeletal:  Lower extremity edema  Neurological: She is alert and oriented to person, place, and time.  Speech is dysarthric.   Patient does have some expressive language deficits.  Follows simple commands Motor: Left upper extremity/left lower extremity 4+/5 proximal distal Right upper extremity/right lower extremity: 0/5 proximal distal Right thumb tremor noted Sensation diminished light touch right upper extremity/right lower extremity  Skin: Skin is warm and dry.  Psychiatric: She has a normal mood and affect. Her behavior is normal.    Results for orders placed or performed during the hospital encounter of 10/29/17 (from the past 48 hour(s))  Basic metabolic panel     Status: Abnormal   Collection Time: 10/31/17  6:03 AM  Result Value Ref Range   Sodium 141 135 - 145 mmol/L   Potassium 3.6 3.5 - 5.1 mmol/L   Chloride 111 98 - 111 mmol/L   CO2 23 22 - 32 mmol/L    Glucose, Bld 106 (H) 70 - 99 mg/dL   BUN 16 8 - 23 mg/dL   Creatinine, Ser 1.28 (H) 0.44 - 1.00 mg/dL   Calcium 8.7 (L) 8.9 - 10.3 mg/dL   GFR calc non Af Amer 41 (L) >60 mL/min   GFR calc Af Amer 48 (L) >60 mL/min    Comment: (NOTE) The eGFR has been calculated using the CKD EPI equation. This calculation has not been validated in all clinical situations. eGFR's persistently <60 mL/min signify possible Chronic Kidney Disease.    Anion gap 7 5 - 15    Comment: Performed at [facility], [address], [place], [place] [address]   No results found.     Medical Problem List and Plan: 1.  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 2.  DVT Prophylaxis/Anticoagulation: Eliquis resumed 3. Pain Management: Topamax 100 mg twice daily, oxycodone as needed 4. Mood: Xanax 1 mg nightly 5. Neuropsych: This patient is capable of making decisions on her own behalf. 6. Skin/Wound Care: Routine skin checks 7. Fluids/Electrolytes/Nutrition: Routine in and outs with follow-up chemistries 8.  Diastolic congestive heart failure.  Demadex 20 mg daily.  Monitor for any signs of fluid overload 9.  PAF.  Eliquis resumed.  Cardiac rate controlled 10.  Hypertension.  Permissive hypertension.  Monitor with increased mobility 11.  COPD.  Check oxygen saturations every shift.  Continue nebulizers 12.  CKD stage II.  Follow-up chemistries 13.  Hypothyroidism.  TSH 1.882. Synthroid 14.  Hyperlipidemia.  Lipitor/Lovaza 15.  GERD.  Protonix 16.  Constipation.  Laxative assistance  Post Admission Physician Evaluation: 1. Preadmission assessment reviewed and changes made below. 2. Functional deficits secondary  to left external capsule infarct. 3. Patient is admitted to receive collaborative, interdisciplinary care between the physiatrist, rehab nursing staff,  and therapy team. 4. Patient's level of medical complexity and substantial therapy needs in context of that medical necessity cannot be provided at a lesser intensity of care such as a SNF. 5. Patient has experienced substantial functional loss from his/her baseline which was documented above under the "Functional History" and "Functional Status" headings.  Judging by the patient's diagnosis, physical exam, and functional history, the patient has potential for functional progress which will result in measurable gains while on inpatient rehab.  These gains will be of substantial and practical use upon discharge  in facilitating mobility and self-care  at the household level. 88. Physiatrist will provide 24 hour management of medical needs as well as oversight of the therapy plan/treatment and provide guidance as appropriate regarding the interaction of the two. 7. 24 hour rehab nursing will assist with bladder management, bowel management, safety, skin/wound care, disease management, medication administration and patient education  and help integrate therapy concepts, techniques,education, etc. 8. PT will assess and treat for/with: Lower extremity strength, range of motion, stamina, balance, functional mobility, safety, adaptive techniques and equipment, coping skills, pain control, education. Goals are: Min/Mod A. 9. OT will assess and treat for/with: ADL's, functional mobility, safety, upper extremity strength, adaptive techniques and equipment, wound mgt, ego support, and community reintegration.   Goals are: Min/Mod A. Therapy may proceed with showering this patient. 10. SLP will assess and treat for/with: Speech, swallowing, language, cognition.  Goals are: Supervision/mod I. 11. Case Management and Social Worker will assess and treat for psychological issues and discharge planning. 12. Team conference will be held weekly to assess progress toward goals and to determine barriers to discharge. 13. Patient  will receive at least 3 hours of therapy per day at least 5 days per week. 14. ELOS: 20-24 days.       15. Prognosis:  good  I have personally performed a face to face diagnostic evaluation, including, but not limited to relevant history and physical exam findings, of this patient and developed relevant assessment and plan.  Additionally, I have reviewed and concur with the physician assistant's documentation above.  The patient's status has not changed. The original post admission physician evaluation remains appropriate, and any changes from the pre-admission screening or documentation from the acute chart are noted above.    Delice Lesch, MD, ABPMR Lavon Paganini Angiulli, PA-C 11/01/2017

## 2017-11-02 ENCOUNTER — Inpatient Hospital Stay (HOSPITAL_COMMUNITY): Payer: Medicare Other

## 2017-11-02 ENCOUNTER — Inpatient Hospital Stay (HOSPITAL_COMMUNITY): Payer: Medicare Other | Admitting: Occupational Therapy

## 2017-11-02 ENCOUNTER — Inpatient Hospital Stay (HOSPITAL_COMMUNITY): Payer: Medicare Other | Admitting: Physical Therapy

## 2017-11-02 DIAGNOSIS — G8191 Hemiplegia, unspecified affecting right dominant side: Secondary | ICD-10-CM

## 2017-11-02 DIAGNOSIS — I639 Cerebral infarction, unspecified: Secondary | ICD-10-CM

## 2017-11-02 LAB — CBC WITH DIFFERENTIAL/PLATELET
ABS IMMATURE GRANULOCYTES: 0.1 10*3/uL (ref 0.0–0.1)
BASOS PCT: 1 %
Basophils Absolute: 0.1 10*3/uL (ref 0.0–0.1)
Eosinophils Absolute: 0.2 10*3/uL (ref 0.0–0.7)
Eosinophils Relative: 1 %
HCT: 45.1 % (ref 36.0–46.0)
Hemoglobin: 14.4 g/dL (ref 12.0–15.0)
IMMATURE GRANULOCYTES: 0 %
Lymphocytes Relative: 23 %
Lymphs Abs: 3.9 10*3/uL (ref 0.7–4.0)
MCH: 29.9 pg (ref 26.0–34.0)
MCHC: 31.9 g/dL (ref 30.0–36.0)
MCV: 93.8 fL (ref 78.0–100.0)
MONO ABS: 1.5 10*3/uL — AB (ref 0.1–1.0)
Monocytes Relative: 9 %
NEUTROS ABS: 11.1 10*3/uL — AB (ref 1.7–7.7)
NEUTROS PCT: 66 %
PLATELETS: 254 10*3/uL (ref 150–400)
RBC: 4.81 MIL/uL (ref 3.87–5.11)
RDW: 13.6 % (ref 11.5–15.5)
WBC: 16.9 10*3/uL — ABNORMAL HIGH (ref 4.0–10.5)

## 2017-11-02 LAB — URINALYSIS, ROUTINE W REFLEX MICROSCOPIC
Bilirubin Urine: NEGATIVE
Glucose, UA: NEGATIVE mg/dL
Hgb urine dipstick: NEGATIVE
KETONES UR: NEGATIVE mg/dL
NITRITE: NEGATIVE
PROTEIN: NEGATIVE mg/dL
Specific Gravity, Urine: 1.021 (ref 1.005–1.030)
pH: 5 (ref 5.0–8.0)

## 2017-11-02 LAB — COMPREHENSIVE METABOLIC PANEL
ALT: 16 U/L (ref 0–44)
AST: 19 U/L (ref 15–41)
Albumin: 3.5 g/dL (ref 3.5–5.0)
Alkaline Phosphatase: 86 U/L (ref 38–126)
Anion gap: 13 (ref 5–15)
BUN: 22 mg/dL (ref 8–23)
CO2: 23 mmol/L (ref 22–32)
CREATININE: 1.69 mg/dL — AB (ref 0.44–1.00)
Calcium: 9.6 mg/dL (ref 8.9–10.3)
Chloride: 108 mmol/L (ref 98–111)
GFR calc non Af Amer: 29 mL/min — ABNORMAL LOW (ref >60)
GFR, EST AFRICAN AMERICAN: 34 mL/min — AB (ref >60)
Glucose, Bld: 126 mg/dL — ABNORMAL HIGH (ref 70–99)
POTASSIUM: 3.5 mmol/L (ref 3.5–5.1)
SODIUM: 144 mmol/L (ref 135–145)
Total Bilirubin: 0.6 mg/dL (ref 0.3–1.2)
Total Protein: 7.5 g/dL (ref 6.5–8.1)

## 2017-11-02 NOTE — Evaluation (Signed)
Occupational Therapy Assessment and Plan  Patient Details  Name: Holly Flores MRN: [id number] Date of Birth: 17-Dec-1945  OT Diagnosis: disturbance of vision, flaccid hemiplegia and hemiparesis, hemiplegia affecting dominant side and muscle weakness (generalized) Rehab Potential: Rehab Potential (ACUTE ONLY): Good ELOS: 22-26 days   Today's Date: 11/02/2017 OT Individual Time: 1000-1100 OT Individual Time Calculation (min): 60 min     Problem List:  Patient Active Problem List   Diagnosis Date Noted  . Left middle cerebral artery stroke ([place]) 11/01/2017  . Acute CVA (cerebrovascular accident) ([place])   . Slow transit constipation   . CKD (chronic kidney disease), stage II   . PAF (paroxysmal atrial fibrillation) ([place])   . Dysphasia, post-stroke   . Right flaccid hemiplegia ([place])   . COPD (chronic obstructive pulmonary disease) ([place]) 10/29/2017  . Chronic diastolic CHF (congestive heart failure) ([place]) 10/29/2017  . Hypothyroidism 10/29/2017  . Essential hypertension 10/29/2017  . GERD (gastroesophageal reflux disease) 10/29/2017  . Anxiety 10/29/2017  . Taking medication for chronic disease 05/18/2017  . Central retinal vein occlusion of left eye 01/22/2015  . Degenerative disc disease, cervical 01/22/2015  . HA (headache) 12/30/2014  . CVA (cerebral vascular accident) ([place]) 12/30/2014  . CVA (cerebral infarction) 12/30/2014  . History of colonic polyps 05/14/2014  . 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 Chest pain 04/20/2013  . Dyspnea 04/20/2013  . Hyperlipidemia 04/20/2013    Past Medical History:  Past Medical History:  Diagnosis Date  . Anxiety   . Arthritis   . CHF (congestive heart failure) ([place])   . COPD (chronic obstructive pulmonary disease) ([place])   . Depression   . GERD (gastroesophageal reflux disease)   . Goiter   . Hyperlipidemia   . Hypertension   . Hypothyroidism   . Kidney cysts    left side  . Myocardial infarction ([place])     8 yrs ago.   Marland Kitchen Shortness of breath   . Sleep apnea    uses CPAP, 3  . Stroke ([place])   . Stroke ([place])    OCULAR  LEFT  EYE    LAST YR.   Past Surgical History:  Past Surgical History:  Procedure Laterality Date  . ABDOMINAL HYSTERECTOMY    . BACK SURGERY     x3,cervical and lunmbar, disc.  Marland Kitchen BREAST BIOPSY     left-non cancerous  . CARDIAC CATHETERIZATION    . CARPAL TUNNEL RELEASE Left   . CATARACT EXTRACTION W/PHACO  09/21/2011   Procedure: CATARACT EXTRACTION PHACO AND INTRAOCULAR LENS PLACEMENT (IOC);  Surgeon: Williams Che, MD;  Location: [facility];  Service: Ophthalmology;  Laterality: Left;  CDE:9.78  . CATARACT EXTRACTION W/PHACO Right 10/15/2014   Procedure: CATARACT EXTRACTION PHACO AND INTRAOCULAR LENS PLACEMENT ([place]);  Surgeon: Williams Che, MD;  Location: [facility];  Service: Ophthalmology;  Laterality: Right;  CDE:4.19  . CERVICAL SPINE SURGERY    . CESAREAN SECTION     x2  . CHOLECYSTECTOMY    . COLONOSCOPY  2006   RMR: 1. Internal hemorroids, otherwise normal rectum. 2. Pedunculated polyp at 35 cm. reomved with snare. The remainder of teh colonic mucosa appeared normal.   . COLONOSCOPY  2011   Dr. Gala Romney: multiple ascending colon polyps and rectal polyp, adenomatous  . COLONOSCOPY N/A 05/31/2014   Procedure: COLONOSCOPY;  Surgeon: Daneil Dolin, MD;  Location: [facility];  Service: Endoscopy;  Laterality: N/A;  130  . COLONOSCOPY WITH PROPOFOL N/A 06/24/2017  Procedure: COLONOSCOPY WITH PROPOFOL;  Surgeon: Daneil Dolin, MD;  [facility];  Service: Endoscopy;  Laterality: N/A;  2:15pm  . ESOPHAGOGASTRODUODENOSCOPY  2006   Dr. Gala Romney: Subtle Schatzkis ring, otherwise normal upper GI tract, aside from a small pyloric channell erosion, status post dilation as described above.   Marland Kitchen KNEE SURGERY Bilateral   . POLYPECTOMY  06/24/2017   Procedure: POLYPECTOMY;  Surgeon: Daneil Dolin, MD;  [facility];  Service: Endoscopy;;  ascending  .  TONSILLECTOMY    . TOTAL KNEE ARTHROPLASTY Right 05/24/2013   DR MURPHY  . TOTAL KNEE ARTHROPLASTY Right 05/24/2013   Procedure: TOTAL KNEE ARTHROPLASTY;  Surgeon: [name], MD;  Location: [facility];  Service: Orthopedics;  Laterality: Right;    Assessment & Plan Clinical Impression: Patient is a 72 y.o. right-handed African-American female with history of diastolic congestive heart failure, PAF maintained on Eliquis since July 2019 per Dr. Carlyle Dolly, CKD stage II, COPD, hypertension, hyperlipidemia.  Per chart review, patient, and husband, patient lives with spouse.  Independent prior to admission and driving.  One level home with 3 steps to entry.  Good support of extended family. Husabnd is receiving treatment for leukemia at [place] but can assist.  Presented 10/29/2017 to [facility] with right sided weakness, diplopia and difficulty speaking.  Troponin negative.  MRI reviewed, showing left brain infarct.  Per CT and MRI report, acute infarction left external capsule posteriorly.  Chronic microhemorrhage left frontal white matter adjacent to the head of the caudate.  CT angiogram of head and neck with no large vessel occlusion.  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  Echocardiogram with ejection fraction of 65% no wall motion abnormalities without thrombus.  Patient did not receive TPA.  Follow-up neurology services.  Patient's chronic Eliquis initially held to avoid hemorrhagic transformation and was initially placed on aspirin therapy for 3 days and then Eliquis resumed 11/01/2017 and aspirin at that time discontinued.  Presently on a mechanical soft diet with thin liquids.  Therapy evaluations completed.  Patient was admitted for a comprehensive rehab program.   Patient transferred to CIR on 11/01/2017 .    Patient currently requires max to total +2 with basic self-care skills secondary to muscle weakness,  unbalanced muscle activation and decreased coordination, decreased visual acuity and decreased visual motor skills, and decreased sitting balance, decreased standing balance, hemiplegia and decreased balance strategies.  Prior to hospitalization, patient could complete ADLs with independent .  Patient will benefit from skilled intervention to decrease level of assist with basic self-care skills prior to discharge home with care partner.  Anticipate patient will require 24 hour supervision and minimal physical assistance and follow up home health.  OT - End of Session Activity Tolerance: Tolerates 30+ min activity with multiple rests Endurance Deficit: Yes Endurance Deficit Description: generalized deconditioning OT Assessment Rehab Potential (ACUTE ONLY): Good OT Patient demonstrates impairments in the following area(s): Balance;Endurance;Motor;Perception;Safety;Sensory;Vision OT Basic ADL's Functional Problem(s): Eating;Grooming;Bathing;Dressing;Toileting OT Transfers Functional Problem(s): Toilet;Tub/Shower OT Additional Impairment(s): Fuctional Use of Upper Extremity OT Plan OT Intensity: Minimum of 1-2 x/day, 45 to 90 minutes OT Frequency: 5 out of 7 days OT Duration/Estimated Length of Stay: 22-26 days OT Treatment/Interventions: Balance/vestibular training;Community reintegration;Discharge planning;Disease mangement/prevention;DME/adaptive equipment instruction;Functional electrical stimulation;Functional mobility training;Neuromuscular re-education;Pain management;Patient/family education;Psychosocial support;Self Care/advanced ADL retraining;Skin care/wound managment;Splinting/orthotics;Therapeutic Activities;Therapeutic Exercise;UE/LE Strength taining/ROM;UE/LE Coordination activities;Visual/perceptual remediation/compensation;Wheelchair propulsion/positioning OT Self Feeding Anticipated Outcome(s): Supervision/setup  OT Basic Self-Care Anticipated Outcome(s): Min assit OT Toileting  Anticipated Outcome(s): Min assist OT Bathroom Transfers Anticipated Outcome(s): Min assist OT Recommendation Recommendations for Other Services: Therapeutic Recreation consult Therapeutic Recreation Interventions: Stress management Patient destination: Home Follow Up Recommendations: Home health OT;24 hour supervision/assistance Equipment Recommended: 3 in 1 bedside comode   Skilled Therapeutic Intervention OT eval completed with discussion of rehab process, OT purpose, POC, ELOS, and goals.  ADL assessment completed with bathing at bed level progressing to sit > stand in [place].  Pt reports need to toilet.  Completed transfer to [facility] with use of Stedy with +2 for safety.  Pt demonstrated sit > stand in [place] with RLE blocked with min-mod assist.  Therefore completed stand pivot transfer back to bed at end of session with Max assist with +2 for safety.  Pt demonstrated flaccid RUE with decreased awareness of RUE and RLE.  OT Evaluation Precautions/Restrictions  Precautions Precautions: Fall Precaution Comments: R hemi, R inattention Restrictions Weight Bearing Restrictions: No Pain Pain Assessment Pain Scale: 0-10 Pain Score: 0-No pain Home Living/Prior Functioning Home Living Available Help at Discharge: Family Type of Home: House Home Access: Stairs to enter [employer] of Steps: 2-3 steps to enter house with rails, 2 steps down to den but pt reports she does not have to go down there, pt states husband can install L rail at home entry Entrance Stairs-Rails: None(pt states husband can install L rail) Home Layout: One level Bathroom Shower/Tub: Walk-in shower(has a shower seat and grab bars in shower) Bathroom Toilet: Standard Bathroom Accessibility: Yes  Lives With: Spouse IADL History Homemaking Responsibilities: Yes Meal Prep Responsibility: Primary Laundry Responsibility: Primary Cleaning Responsibility: Primary Bill Paying/Finance Responsibility:  Primary Shopping Responsibility: Primary Prior Function Level of Independence: Independent with basic ADLs, Independent with homemaking with ambulation, Independent with gait, Independent with transfers(community ambulator without AD)  Able to Take Stairs?: Yes Driving: Yes Vocation: On disability Comments: Hydrographic surveyor, drives ADL ADL Grooming: Setup Where Assessed-Grooming: Chair Upper Body Bathing: Minimal assistance Where Assessed-Upper Body Bathing: Chair Lower Body Bathing: Moderate assistance Where Assessed-Lower Body Bathing: Bed level Upper Body Dressing: Moderate assistance Where Assessed-Upper Body Dressing: Chair Lower Body Dressing: (+2) Toileting: Dependent Where Assessed-Toileting: Bedside Commode Toilet Transfer: Minimal verbal cueing(+2) Toilet Transfer Method: Other (comment)(stedy) Toilet Transfer Equipment: Bedside commode Tub/Shower Transfer: Unable to assess Tub/Shower Transfer Method: Unable to assess Social research officer, government: Unable to assess Social research officer, government Method: Unable to assess Vision Baseline Vision/History: No visual deficits Patient Visual Report: Diplopia;Peripheral vision impairment Vision Assessment?: Yes Eye Alignment: Within Functional Limits Ocular Range of Motion: Within Functional Limits Alignment/Gaze Preference: Within Defined Limits Tracking/Visual Pursuits: Able to track stimulus in all quads without difficulty Diplopia Assessment: Disappears with one eye closed Additional Comments: peripheral vision limited by 25% on both right and left side Cognition Overall Cognitive Status: Impaired/Different from baseline Arousal/Alertness: Awake/alert Orientation Level: Person;Place;Situation Person: Oriented Place: Oriented Situation: Oriented Year: 2019 Month: September Day of Week: Correct Memory: Appears intact Immediate Memory Recall: Sock;Blue;Bed Memory Recall: Sock;Bed;Blue Memory Recall Sock: Without  Cue Memory Recall Blue: Without Cue Memory Recall Bed: Without Cue Awareness: Appears intact Problem Solving: Appears intact Safety/Judgment: Impaired Sensation Sensation Light Touch: Impaired Detail Peripheral sensation comments: reports "dull"  Light Touch Impaired Details: Impaired RLE Proprioception: Appears Intact(BLE) Proprioception Impaired Details: Impaired RUE Coordination Gross Motor Movements are Fluid and Coordinated: No Fine Motor Movements are Fluid and Coordinated: No Finger Nose Finger Test: unable to complete with RUE Motor  Motor  Motor: Hemiplegia;Abnormal postural alignment and control Motor - Skilled Clinical Observations: generalized deconditioning Mobility  Bed Mobility Bed Mobility: Supine to Sit;Sit to Supine(with hospital bed features) Supine to Sit: Maximal Assistance - Patient - Patient 25-49% Sit to Supine: Maximal Assistance - Patient 25-49% Transfers Sit to Stand: Maximal Assistance - Patient 25-49%  Trunk/Postural Assessment  Thoracic Assessment Thoracic Assessment: (rounded shoulders) Lumbar Assessment Lumbar Assessment: (posterior pelvic tilt) Postural Control Postural Control: (righting reactions & postural control delayed/impaired)  Balance Balance Balance Assessed: Yes Static Sitting Balance Static Sitting - Balance Support: Bilateral upper extremity supported;Feet supported Static Sitting - Level of Assistance: 5: Stand by assistance Static Sitting - Comment/# of Minutes: 3 Extremity/Trunk Assessment RUE Assessment RUE Assessment: Exceptions to [facility] Passive Range of Motion (PROM) Comments: WFL Active Range of Motion (AROM) Comments: flaccid General Strength Comments: flaccid LUE Assessment LUE Assessment: Within Functional Limits   See Function Navigator for Current Functional Status.   Refer to Care Plan for Long Term Goals  Recommendations for other services: None    Discharge Criteria: Patient will be discharged from OT  if patient refuses treatment 3 consecutive times without medical reason, if treatment goals not met, if there is a change in medical status, if patient makes no progress towards goals or if patient is discharged from hospital.  The above assessment, treatment plan, treatment alternatives and goals were discussed and mutually agreed upon: by patient  Earle Troiano, [facility] 11/02/2017, 11:00 AM

## 2017-11-02 NOTE — Progress Notes (Signed)
[facility] PMR Admission Coordinator Pre-Admission Assessment  Patient: Holly Flores is an 72 y.o., female MRN: [id number] DOB: 1945-10-03 Height: 5\' 1"  (154.9 cm) Weight: 99.8 kg  Insurance Information HMO:     PPO:      PCP:      IPA:      80/20:      OTHER:  PRIMARY: Medicare A & B      Policy#: [id number]      Subscriber: Self Pre-Cert#: Eligible       Employer: Retired Benefits:  Phone #: Verified online     Name: [employer]  Eff. Date: A:12/18/91, B:07/17/92     Deduct: $1364      Out of Pocket Max: N/A      Life Max: N/A CIR: 100%      SNF: 100% days 1-20; 80% days 21-100 Outpatient: 80%     Co-Pay: 20% Home Health: 100%      Co-Pay: $0 DME: 80%     Co-Pay: 20% Providers: Patient's Choice   SECONDARY11-16-1996       Policy#: Lurena Joiner      Subscriber: Self Employer: Retired    Benefits:  Phone #: [phone]      Emergency [place]    Name [place] E Spouse [phone]  [phone]   Lezlee, Gills   Costella Hatcher   [phone] Daughter   [phone]   Domnique, Vanegas Granddaughter   [phone]      Current Medical History  Patient Admitting Diagnosis: Acute infarct left external capsule posteriorly   History of Present Illness: Holly Flores is a 72 year old right-handed African-American female with history of diastolic congestive heart failure, PAF maintained on Eliquis,CKD stage II,COPD, hypertension, hyperlipidemia. Per chart review patient lives with spouse. Independent prior to admission and driving. One level home with 3 steps to entry. Presented 10/29/2017 to [facility] with right sided weakness, diplopia and difficulty speaking. Troponin negative. CT/MRI showed acute infarction left external capsule posteriorly. Chronic microhemorrhage left frontal white matter adjacent to the head of the caudate. CT angiogram of head and  neck with no large vessel occlusion. 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 Echocardiogram with ejection fraction of 65% no wall motion abnormalities. Patient did not receive TPA. Follow-up neurology services. Patient's chronic Eliquis initially held to avoid hemorrhagic transformation and was initially placed on aspirin therapy for 3 days and then Eliquis resumed 11/01/2017 and aspirin at that time discontinued. Presently on a mechanical soft diet with thin liquids. Therapy evaluations completed. Patient was admitted for a comprehensive rehab program 11/01/17.   Patient's medical record from [facility] has been reviewed by the rehabilitation admission coordinator and physician.  NIH Stroke scale: 13  Past Medical History      Past Medical History:  Diagnosis Date  . Anxiety   . Arthritis   . CHF (congestive heart failure) ([place])   . COPD (chronic obstructive pulmonary disease) ([place])   . Depression   . GERD (gastroesophageal reflux disease)   . Goiter   . Hyperlipidemia   . Hypertension   . Hypothyroidism   . Kidney cysts    left side  . Myocardial infarction ([place])    8 yrs ago.   [address] Shortness of breath   . Sleep apnea    uses CPAP, 3  .  Stroke ([place])   . Stroke ([place])    OCULAR  LEFT  EYE    LAST YR.    Family History   family history includes Alcoholism in her other; Asthma in her other; Cancer in her other; Diabetes in her other; Heart disease in her other; Hypercholesterolemia in her other; Hypertension in her other; Rheumatologic disease in her other; Stroke in her other; Thyroid disease in her other.  Prior Rehab/Hospitalizations Has the patient had major surgery during 100 days prior to admission? No              Current Medications Refer to [facility]   Patients Current Diet:  Dys.3 textures and thin liquids   Precautions /  Restrictions Precautions Precautions: Fall Precautions/Special Needs: Swallowing Restrictions Weight Bearing Restrictions: No   Has the patient had 2 or more falls or a fall with injury in the past year?Yes  Prior Activity Level Community (5-7x/wk): Prior to admission patient was fully independent at home, out daily, and driving.  She reports using a cane or wheelchair of she was out in the community for long distances.    Prior Functional Level Self Care: Did the patient need help bathing, dressing, using the toilet or eating? Independent  Indoor Mobility: Did the patient need assistance with walking from room to room (with or without device)? Independent  Stairs: Did the patient need assistance with internal or external stairs (with or without device)? Independent  Functional Cognition: Did the patient need help planning regular tasks such as shopping or remembering to take medications? Independent  Home Equities trader / Equipment Home Assistive Devices/Equipment: Wheelchair, Environmental consultant (specify type), Kasandra Knudsen (specify quad or straight), Shower chair with back Home Equipment: Cane - single point, Environmental consultant - 2 wheels, Shower seat, Bedside commode, Wheelchair - manual  Prior Device Use: Indicate devices/aids used by the patient prior to current illness, exacerbation or injury? Cane or wheelchair depending on distance in the community only.   Prior Functional Level Current Functional Level  Bed Mobility  Independent   Max A   Transfers  Independent   Max A   Mobility - Walk/Wheelchair  Independent   Max A   Upper Body Dressing  Independent   Mod A   Lower Body Dressing  Independent   Max A   Grooming  Independent   Min A   Eating/Drinking  Independent   Min A   Toilet Transfer  Independent   Max A   Bladder Continence   Yes    Continent    Bowel Management  Continent    Continent    Stair Climbing  Independent     TBA   Communication  Independent    Min A   Memory  Independent    TBA   Cooking/Meal Prep  Artist  Independent       Special needs/care consideration BiPAP/CPAP: Yes, 2L O2 or CPAP at night  CPM: No Continuous Drip IV: No Dialysis: No         Life Vest: No Oxygen: No Special Bed: No Trach Size: No Wound Vac (area): No       Skin: WDL                               Bowel mgmt: 11/01/17 Diarrhea documented  Bladder  mgmt: Continent documented, but also noted use of external catheter  Diabetic mgmt: No  Previous Home Environment Living Arrangements: Spouse/significant other  Lives With: Spouse Available Help at Discharge: Family Type of Home: House Home Layout: One level Home Access: Stairs to enter Entrance Stairs-Rails: None Entrance Stairs-Number of Steps: 3 steps onto porch, 2 steps into [employer] Shower/Tub: Tub/shower unit, Architectural technologist: Standard Bathroom Accessibility: Yes How Accessible: Accessible via walker [place]: No  Discharge Living Setting Plans for Discharge Living Setting: Patient's home, Lives with (comment)(Spouse and son next door) Type of Home at Discharge: House Discharge Home Layout: One level Discharge Home Access: Stairs to enter Entrance Stairs-Rails: None Entrance Stairs-Number of Steps: 5 Discharge Bathroom Shower/Tub: Tub/shower unit, Curtain Discharge Bathroom Toilet: Standard Discharge Bathroom Accessibility: Yes How Accessible: Accessible via walker Does the patient have any problems obtaining your medications?: No  Social/Family/Support Systems Patient Roles: Spouse, Parent, Other (Comment)(Grandparent ) Contact Information: see above  Anticipated Caregiver: Spouse, daughter, granddaughter  Anticipated Ambulance person Information: see above  Ability/Limitations of Caregiver: Spouse can do  light assist  Caregiver Availability: 24/7 Discharge Plan Discussed with Primary Caregiver: Yes Is Caregiver In Agreement with Plan?: Yes Does Caregiver/Family have Issues with Lodging/Transportation while Pt is in Rehab?: No  Goals/Additional Needs Patient/Family Goal for Rehab: SLP: Mod I; PT/OT: Min A Expected length of stay: 15-20 days  Cultural Considerations: None Dietary Needs: Dys.3 textures and thin liquids  Equipment Needs: TBD Pt/Family Agrees to Admission and willing to participate: Yes Program Orientation Provided & Reviewed with Pt/Caregiver Including Roles  & Responsibilities: Yes  Patient Condition: I have reviewed medical records from [facility], spoken with CM, patient, and her son. I discussed via phone with patient and son for inpatient rehabilitation assessment.  Patient will benefit from ongoing PT, OT, and SLP, can actively participate in 3 hours of therapy a day 5 days of the week, and can make measurable gains during the admission.  Patient will also benefit from the coordinated team approach during an Inpatient Acute Rehabilitation admission.  The patient will receive intensive therapy as well as Rehabilitation physician, nursing, social worker, and care management interventions.  Due to bowel management, bladder management, safety, disease management, medical administration, pain management, patient education the patient requires 24 hour a day rehabilitation nursing.  The patient is currently Max A with transfers and mobility and basic ADLs.  Discharge setting and therapy post discharge at home with home health is anticipated.  Patient has agreed to participate in the Acute Inpatient Rehabilitation Program and will admit today, 11/01/17.      Preadmission Screen Completed By:  Jamse Arn, 11/01/2017 11:59 AM ______________________________________________________________________   Discussed status with Dr. Posey Pronto on 11/01/17 at 1145 and received telephone approval  for admission today.  Admission Coordinator:  Ankit Lorie Phenix, time 1145/Date 11/01/17   Assessment/Plan: Diagnosis:  Acute infarct left external capsule posteriorly   1. Does the need for close, 24 hr/day  Medical supervision in concert with the patient's rehab needs make it unreasonable for this patient to be served in a less intensive setting? Yes  2. Co-Morbidities requiring supervision/potential complications: diastolic congestive heart failure, PAF maintained on Eliquis,CKD stage II,COPD, hypertension, hyperlipidemia.  3. Due to safety, disease management and patient education, does the patient require 24 hr/day rehab nursing? Yes 4. Does the patient require coordinated care of a physician, rehab nurse, PT (1-2 hrs/day, 5 days/week) and OT (1-2 hrs/day, 5 days/week) to address physical and functional deficits  in the context of the above medical diagnosis(es)? Yes Addressing deficits in the following areas: balance, endurance, locomotion, strength, transferring, bowel/bladder control, bathing, dressing, feeding, grooming, toileting and psychosocial support 5. Can the patient actively participate in an intensive therapy program of at least 3 hrs of therapy 5 days a week? Yes 6. The potential for patient to make measurable gains while on inpatient rehab is excellent 7. Anticipated functional outcomes upon discharge from inpatients are: min assist and mod assist PT, min assist and mod assist OT, n/a SLP 8. Estimated rehab length of stay to reach the above functional goals is: 19-24 days. 9. Does the patient have adequate social supports to accommodate these discharge functional goals? Yes 10. Anticipated D/C setting: Home 11. Anticipated post D/C treatments: HH therapy and Home excercise program 12. Overall Rehab/Functional Prognosis: good    RECOMMENDATIONS: This patient's condition is appropriate for continued rehabilitative care in the following setting: CIR Patient has agreed  to participate in recommended program. Yes Note that insurance prior authorization may be required for reimbursement for recommended care.  Ankit Lorie Phenix 11/01/2017        Revision History

## 2017-11-02 NOTE — Progress Notes (Signed)
Occupational Therapy Session Note  Patient Details  Name: Holly Flores MRN: [id number] Date of Birth: 25-Sep-1945  Today's Date: 11/02/2017 OT Individual Time: 1400-1430 OT Individual Time Calculation (min): 30 min    Short Term Goals: Week 1:  OT Short Term Goal 1 (Week 1): Pt will complete bathing with max assist of one caregiver at sit > stand level OT Short Term Goal 2 (Week 1): Pt will complete LB dressing with max assist of one caregiver at sit > stand level OT Short Term Goal 3 (Week 1): Pt will complete toilet transfer with max assist of one caregiver OT Short Term Goal 4 (Week 1): Pt will complete UB dressing with mod assist  Skilled Therapeutic Interventions/Progress Updates:  OT intervention with focus on bed mobility, sitting balance, sit<>stand, lateral scoots, and safety awareness to increase independence with BADLs.  Pt required max A for supine>sidelying and sidelying>sit EOB.  Pt able to scoot R hip to EOB without assistance.  Pt performed sit<>stand X 3 with max A X 1 and mod A X 2.  Pt also performed lateral scoots to [facility] with mod A for sit<>stand and tot A to shift hips. Pt remained in bed with all needs within reach and bed alarm activated.   Therapy Documentation Precautions:  Precautions Precautions: Fall Precaution Comments: R hemi, R inattention Restrictions Weight Bearing Restrictions: No Pain:  Pt denies pain  See Function Navigator for Current Functional Status.   Therapy/Group: Individual Therapy  [name] 11/02/2017, 2:43 PM

## 2017-11-02 NOTE — Progress Notes (Signed)
Pt has refused cpap again tonight.  This is the second night of refusal. RT will monitor.

## 2017-11-02 NOTE — Evaluation (Signed)
Speech Language Pathology Assessment and Plan  Patient Details  Name: Holly Flores MRN: [id number] Date of Birth: 1945-04-04  SLP Diagnosis: Dysarthria;Cognitive Impairments;Dysphagia  Rehab Potential: Good ELOS: 22-24 days    Today's Date: 11/02/2017 SLP Individual Time: 1300-1400 SLP Individual Time Calculation (min): 60 min   Problem List:  Patient Active Problem List   Diagnosis Date Noted  . 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 Acute CVA (cerebrovascular accident) ([place])   . Slow transit constipation   . CKD (chronic kidney disease), stage II   . PAF (paroxysmal atrial fibrillation) ([place])   . Dysphasia, post-stroke   . 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 COPD (chronic obstructive pulmonary disease) ([place]) 10/29/2017  . Chronic diastolic CHF (congestive heart failure) ([place]) 10/29/2017  . Hypothyroidism 10/29/2017  . Essential hypertension 10/29/2017  . GERD (gastroesophageal reflux disease) 10/29/2017  . Anxiety 10/29/2017  . Taking medication for chronic disease 05/18/2017  . Central retinal vein occlusion of left eye 01/22/2015  . Degenerative disc disease, cervical 01/22/2015  . HA (headache) 12/30/2014  . CVA (cerebral vascular accident) ([place]) 12/30/2014  . CVA (cerebral infarction) 12/30/2014  . History of colonic polyps 05/14/2014  . 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 Chest pain 04/20/2013  . Dyspnea 04/20/2013  . Hyperlipidemia 04/20/2013   Past Medical History:  Past Medical History:  Diagnosis Date  . Anxiety   . Arthritis   . CHF (congestive heart failure) ([place])   . COPD (chronic obstructive pulmonary disease) ([place])   . Depression   . GERD (gastroesophageal reflux disease)   . Goiter   . Hyperlipidemia   . Hypertension   . Hypothyroidism   . Kidney cysts    left side  . Myocardial infarction ([place])    8 yrs ago.   Marland Kitchen Shortness of breath   . Sleep apnea    uses CPAP, 3  . Stroke ([place])   . Stroke ([place])     OCULAR  LEFT  EYE    LAST YR.   Past Surgical History:  Past Surgical History:  Procedure Laterality Date  . ABDOMINAL HYSTERECTOMY    . BACK SURGERY     x3,cervical and lunmbar, disc.  Marland Kitchen BREAST BIOPSY     left-non cancerous  . CARDIAC CATHETERIZATION    . CARPAL TUNNEL RELEASE Left   . CATARACT EXTRACTION W/PHACO  09/21/2011   Procedure: CATARACT EXTRACTION PHACO AND INTRAOCULAR LENS PLACEMENT (IOC);  Surgeon: Williams Che, MD;  Location: [facility];  Service: Ophthalmology;  Laterality: Left;  CDE:9.78  . CATARACT EXTRACTION W/PHACO Right 10/15/2014   Procedure: CATARACT EXTRACTION PHACO AND INTRAOCULAR LENS PLACEMENT ([place]);  Surgeon: Williams Che, MD;  Location: [facility];  Service: Ophthalmology;  Laterality: Right;  CDE:4.19  . CERVICAL SPINE SURGERY    . CESAREAN SECTION     x2  . CHOLECYSTECTOMY    . COLONOSCOPY  2006   RMR: 1. Internal hemorroids, otherwise normal rectum. 2. Pedunculated polyp at 35 cm. reomved with snare. The remainder of teh colonic mucosa appeared normal.   . COLONOSCOPY  2011   Dr. Gala Romney: multiple ascending colon polyps and rectal polyp, adenomatous  . COLONOSCOPY N/A 05/31/2014   Procedure: COLONOSCOPY;  Surgeon: Daneil Dolin, MD;  Location: [facility];  Service: Endoscopy;  Laterality: N/A;  130  . COLONOSCOPY WITH PROPOFOL N/A 06/24/2017   Procedure: COLONOSCOPY WITH PROPOFOL;  Surgeon: Daneil Dolin, MD;  [facility];  Service: Endoscopy;  Laterality: N/A;  2:15pm  . ESOPHAGOGASTRODUODENOSCOPY  2006   Dr. Gala Romney: Subtle Schatzkis ring, otherwise normal upper GI tract, aside from a small pyloric channell erosion, status post dilation as described above.   Marland Kitchen KNEE SURGERY Bilateral   . POLYPECTOMY  06/24/2017   Procedure: POLYPECTOMY;  Surgeon: Daneil Dolin, MD;  Location[facility];  Service: Endoscopy;;  ascending  . TONSILLECTOMY    . TOTAL KNEE ARTHROPLASTY Right 05/24/2013   DR MURPHY  . TOTAL KNEE ARTHROPLASTY Right 05/24/2013    Procedure: TOTAL KNEE ARTHROPLASTY;  Surgeon: [name], MD;  Location: [facility];  Service: Orthopedics;  Laterality: Right;    Assessment / Plan / Recommendation Clinical Impression Telina Flores is a 72 year old right-handed African-American female with history of diastolic congestive heart failure, PAF maintained on Eliquis since July 2019 per Dr. Carlyle Dolly, CKD stage II, COPD, hypertension, hyperlipidemia.  Per chart review, patient, and husband, patient lives with spouse.  Independent prior to admission and driving.  One level home with 3 steps to entry.  Good support of extended family. Husabnd is receiving treatment for leukemia at [facility] but can assist.  Presented 10/29/2017 to [facility] with right sided weakness, diplopia and difficulty speaking.  Troponin negative.  MRI reviewed, showing left brain infarct.  Per CT and MRI report, acute infarction left external capsule posteriorly.  Chronic microhemorrhage left frontal white matter adjacent to the head of the caudate.  CT angiogram of head and neck with no large vessel occlusion.  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  Echocardiogram with ejection fraction of 65% no wall motion abnormalities without thrombus.  Patient did not receive TPA.  Follow-up neurology services.  Patient's chronic Eliquis initially held to avoid hemorrhagic transformation and was initially placed on aspirin therapy for 3 days and then Eliquis resumed 11/01/2017 and aspirin at that time discontinued.  Presently on a mechanical soft diet with thin liquids.  Therapy evaluations completed.  Patient was admitted for a comprehensive rehab program.   Pt presents with severe-moderate cognitive lingustic impairments, deficits include higher level function skills, semi-complex problem solving, error awareness, selective attention, as well as novel recall. Cognitive linguistic  assessment MOCA version 7.1 supported cognitive changes with a score of 18 out 30 (n=>26.) Pt presents with mild dysarthria, slight delay of initiation in response, impairments at sentence level with 60% intelligibility, and little awareness of reduced intelligibility. Pt also presents with reduced vocal quality and mildly harsh vocal quality, however family supports is baseline. Pt presents with mild oral dysphagia, slight prolonged mastication and oral reduced, although independently cleared with liquid wash/llingual sweeps on regular textures, no overt s/s aspiration noted on solids and thin cup/straw trials. SLP reccomends current diet of dys 3 and thin liquids with therapeutic trials for solid upgrade. Pt would benefit from skilled ST services in order to maximize functional independence and reduce burden of care upon returning home, with continuing of ST services at next level of care if Mod I is not achieved.    Skilled Therapeutic Interventions          Skilled ST services focused on speech skills. SLP facilitated cognitive linguistic assessment MOCA version 7.1, and provided explanation of results. Pt and daughter agreed to cognitive changes, however required further explanation of speech deficits. SLP provided initial education of SLP, speech intelligibility strategies, explaining familiar verse. unfamiliar listeners. Pt required mod A  verbal cues at sentence level for intelligibility to unfamiliar listener, communication between pt and daughter min A verbal cues. Pt was left in room with call bell within reach and bed alaram set. ST reccomends to continue skilled ST services.   SLP Assessment  Patient will need skilled Speech Lanaguage Pathology Services during CIR admission    Recommendations  SLP Diet Recommendations: Thin;Dysphagia 3 (Mech soft) Liquid Administration via: Straw Medication Administration: Whole meds with liquid Supervision: Intermittent supervision to cue for compensatory  strategies Compensations: Lingual sweep for clearance of pocketing Oral Care Recommendations: Oral care BID;Staff/trained caregiver to provide oral care Patient destination: Home Follow up Recommendations: Outpatient SLP;Home Health SLP Equipment Recommended: None recommended by SLP    SLP Frequency 3 to 5 out of 7 days   SLP Duration  SLP Intensity  SLP Treatment/Interventions 22-24 days  Minumum of 1-2 x/day, 30 to 90 minutes  Cognitive remediation/compensation;Cueing hierarchy;Functional tasks;Medication managment;Speech/Language facilitation;Dysphagia/aspiration precaution training    Pain Pain Assessment Pain Score: 0-No pain  Prior Functioning Cognitive/Linguistic Baseline: Within functional limits Type of Home: House  Lives With: Spouse Available Help at Discharge: Family Education: 12th grade Vocation: On disability  Function:  Eating Eating   Modified Consistency Diet: No Eating Assist Level: Supervision or verbal cues;Helper checks for pocketed food           Cognition Comprehension Comprehension assist level: Follows basic conversation/direction with no assist  Expression   Expression assist level: Expresses basic 50 - 74% of the time/requires cueing 25 - 49% of the time. Needs to repeat parts of sentences.  Social Interaction Social Interaction assist level: Interacts appropriately 75 - 89% of the time - Needs redirection for appropriate language or to initiate interaction.  Problem Solving Problem solving assist level: Solves basic 75 - 89% of the time/requires cueing 10 - 24% of the time  Memory Memory assist level: Recognizes or recalls 50 - 74% of the time/requires cueing 25 - 49% of the time   Short Term Goals: Week 1: SLP Short Term Goal 1 (Week 1): Pt will utilizie speech intelligibiity strategies at sentence level with Mod A verbal cues to achieve 80% intelligibility. SLP Short Term Goal 2 (Week 1): Pt will demonstrate functional problem solving  for mildly complex tasks with Mod A verbal cues. SLP Short Term Goal 3 (Week 1): Pt will self-monitor and self-correct functional errors with mod A verbal cues. SLP Short Term Goal 4 (Week 1): Pt will demonstrate selective attention during functional tasks for 30 minutes with Min A verbal cues for redirection. SLP Short Term Goal 5 (Week 1): Pt will utilize compensatory strategies to recall new, daily information with Min A verbal and question cues. SLP Short Term Goal 6 (Week 1): Pt will consume regular texture trials without overt s/s aspiration with Mod I for use of swallowing compensatory strategies (oral clearance.)  Refer to Care Plan for Long Term Goals  Recommendations for other services: None   Discharge Criteria: Patient will be discharged from SLP if patient refuses treatment 3 consecutive times without medical reason, if treatment goals not met, if there is a change in medical status, if patient makes no progress towards goals or if patient is discharged from hospital.  The above assessment, treatment plan, treatment alternatives and goals were discussed and mutually agreed upon: by patient and by family  Latalia Etzler  [facility] 11/02/2017, 3:53 PM

## 2017-11-02 NOTE — Evaluation (Signed)
Physical Therapy Assessment and Plan  Patient Details  Name: Holly Flores MRN: [id number] Date of Birth: 1945/12/16  PT Diagnosis: Abnormal posture, Abnormality of gait, Cognitive deficits, Coordination disorder, Difficulty walking, Hemiplegia dominant, Impaired cognition, Impaired sensation and Muscle weakness Rehab Potential: Good ELOS: 3 weeks   Today's Date: 11/02/2017 PT Individual Time: 2778-2423 PT Individual Time Calculation (min): 58 min    Problem List:  Patient Active Problem List   Diagnosis Date Noted  . Left middle cerebral artery stroke ([place]) 11/01/2017  . Acute CVA (cerebrovascular accident) ([place])   . Slow transit constipation   . CKD (chronic kidney disease), stage II   . PAF (paroxysmal atrial fibrillation) ([place])   . Dysphasia, post-stroke   . Right flaccid hemiplegia ([place])   . COPD (chronic obstructive pulmonary disease) ([place]) 10/29/2017  . Chronic diastolic CHF (congestive heart failure) ([place]) 10/29/2017  . Hypothyroidism 10/29/2017  . Essential hypertension 10/29/2017  . GERD (gastroesophageal reflux disease) 10/29/2017  . Anxiety 10/29/2017  . Taking medication for chronic disease 05/18/2017  . Central retinal vein occlusion of left eye 01/22/2015  . Degenerative disc disease, cervical 01/22/2015  . HA (headache) 12/30/2014  . CVA (cerebral vascular accident) ([place]) 12/30/2014  . CVA (cerebral infarction) 12/30/2014  . History of colonic polyps 05/14/2014  . 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 Chest pain 04/20/2013  . Dyspnea 04/20/2013  . Hyperlipidemia 04/20/2013    Past Medical History:  Past Medical History:  Diagnosis Date  . Anxiety   . Arthritis   . CHF (congestive heart failure) ([place])   . COPD (chronic obstructive pulmonary disease) ([place])   . Depression   . GERD (gastroesophageal reflux disease)   . Goiter   . Hyperlipidemia   . Hypertension   . Hypothyroidism   . Kidney cysts    left side  . Myocardial  infarction ([place])    8 yrs ago.   Marland Kitchen Shortness of breath   . Sleep apnea    uses CPAP, 3  . Stroke ([place])   . Stroke ([place])    OCULAR  LEFT  EYE    LAST YR.   Past Surgical History:  Past Surgical History:  Procedure Laterality Date  . ABDOMINAL HYSTERECTOMY    . BACK SURGERY     x3,cervical and lunmbar, disc.  Marland Kitchen BREAST BIOPSY     left-non cancerous  . CARDIAC CATHETERIZATION    . CARPAL TUNNEL RELEASE Left   . CATARACT EXTRACTION W/PHACO  09/21/2011   Procedure: CATARACT EXTRACTION PHACO AND INTRAOCULAR LENS PLACEMENT (IOC);  Surgeon: Williams Che, MD;  Location: [facility];  Service: Ophthalmology;  Laterality: Left;  CDE:9.78  . CATARACT EXTRACTION W/PHACO Right 10/15/2014   Procedure: CATARACT EXTRACTION PHACO AND INTRAOCULAR LENS PLACEMENT ([place]);  Surgeon: Williams Che, MD;  Location: [facility];  Service: Ophthalmology;  Laterality: Right;  CDE:4.19  . CERVICAL SPINE SURGERY    . CESAREAN SECTION     x2  . CHOLECYSTECTOMY    . COLONOSCOPY  2006   RMR: 1. Internal hemorroids, otherwise normal rectum. 2. Pedunculated polyp at 35 cm. reomved with snare. The remainder of teh colonic mucosa appeared normal.   . COLONOSCOPY  2011   Dr. Gala Romney: multiple ascending colon polyps and rectal polyp, adenomatous  . COLONOSCOPY N/A 05/31/2014   Procedure: COLONOSCOPY;  Surgeon: Daneil Dolin, MD;  Location: [facility];  Service: Endoscopy;  Laterality: N/A;  130  . COLONOSCOPY WITH PROPOFOL N/A 06/24/2017  Procedure: COLONOSCOPY WITH PROPOFOL;  Surgeon: Daneil Dolin, MD;  Location: [facility];  Service: Endoscopy;  Laterality: N/A;  2:15pm  . ESOPHAGOGASTRODUODENOSCOPY  2006   Dr. Gala Romney: Subtle Schatzkis ring, otherwise normal upper GI tract, aside from a small pyloric channell erosion, status post dilation as described above.   Marland Kitchen KNEE SURGERY Bilateral   . POLYPECTOMY  06/24/2017   Procedure: POLYPECTOMY;  Surgeon: Daneil Dolin, MD;  [facility];  Service: Endoscopy;;   ascending  . TONSILLECTOMY    . TOTAL KNEE ARTHROPLASTY Right 05/24/2013   DR MURPHY  . TOTAL KNEE ARTHROPLASTY Right 05/24/2013   Procedure: TOTAL KNEE ARTHROPLASTY;  Surgeon: [name], MD;  Location: [facility];  Service: Orthopedics;  Laterality: Right;    Assessment & Plan Clinical Impression: Patient is a 72 y.o. year old female, right-handed African-American female, with history of diastolic congestive heart failure, PAF maintained on Eliquis since July 2019 per Dr. Carlyle Dolly, CKD stage II, COPD, hypertension, hyperlipidemia.  Per chart review, patient, and husband, patient lives with spouse.  Independent prior to admission and driving.  One level home with 3 steps to entry.  Good support of extended family. Husabnd is receiving treatment for leukemia at [facility] but can assist.  Presented 10/29/2017 to [facility] with right sided weakness, diplopia and difficulty speaking.  Troponin negative.  MRI reviewed, showing left brain infarct.  Per CT and MRI report, acute infarction left external capsule posteriorly.  Chronic microhemorrhage left frontal white matter adjacent to the head of the caudate.  CT angiogram of head and neck with no large vessel occlusion.  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  Echocardiogram with ejection fraction of 65% no wall motion abnormalities without thrombus.  Patient did not receive TPA.  Follow-up neurology services.  Patient's chronic Eliquis initially held to avoid hemorrhagic transformation and was initially placed on aspirin therapy for 3 days and then Eliquis resumed 11/01/2017 and aspirin at that time discontinued.  Presently on a mechanical soft diet with thin liquids.  Therapy evaluations completed.  Patient was admitted for a comprehensive rehab program..  Patient transferred to CIR on 11/01/2017 .    Patient currently requires total with mobility secondary to muscle  weakness, decreased cardiorespiratoy endurance, decreased coordination and decreased motor planning, decreased visual perceptual skills, decreased attention to right, decreased problem solving, decreased safety awareness and decreased memory, and decreased sitting balance, decreased standing balance, decreased postural control, hemiplegia and decreased balance strategies.  Prior to hospitalization, patient was independent  with mobility and lived with Spouse in a House home.  Home access is 2-3 steps to enter house with rails, 2 steps down to den but pt reports she does not have to go down there, pt states husband can install L rail at home entryStairs to enter.  Patient will benefit from skilled PT intervention to maximize safe functional mobility, minimize fall risk and decrease caregiver burden for planned discharge home with 24 hour assist.  Anticipate patient will benefit from follow up [facility] at discharge.  PT - End of Session Activity Tolerance: Decreased this session Endurance Deficit: Yes Endurance Deficit Description: generalized deconditioning PT Assessment Rehab Potential (ACUTE/IP ONLY): Good PT Barriers to Discharge: Inaccessible home environment;Decreased caregiver support;Home environment access/layout PT Barriers to Discharge Comments: steps to enter home & pt may need ramp, unsure if family can provide 24 hr assist PT Patient demonstrates impairments  in the following area(s): Balance;Behavior;Perception;Endurance;Safety;Motor;Sensory PT Transfers Functional Problem(s): Bed Mobility;Bed to Chair;Car;Furniture PT Locomotion Functional Problem(s): Stairs;Wheelchair Mobility;Ambulation PT Plan PT Intensity: Minimum of 1-2 x/day ,45 to 90 minutes PT Frequency: 5 out of 7 days PT Duration Estimated Length of Stay: 3 weeks PT Treatment/Interventions: Cognitive remediation/compensation;Discharge planning;Ambulation/gait training;DME/adaptive equipment instruction;Functional mobility  training;Pain management;Psychosocial support;Splinting/orthotics;Therapeutic Activities;UE/LE Strength taining/ROM;Visual/perceptual remediation/compensation;Wheelchair propulsion/positioning;UE/LE Coordination activities;Therapeutic Exercise;Stair training;Skin care/wound management;Patient/family education;Neuromuscular re-education;Functional electrical stimulation;Disease management/prevention;Community reintegration;Balance/vestibular training PT Transfers Anticipated Outcome(s): min assist PT Locomotion Anticipated Outcome(s): supervision w/c level PT Recommendation Recommendations for Other Services: Speech consult;Neuropsych consult Follow Up Recommendations: Home health PT(24 hr assist) Patient destination: Home Equipment Recommended: To be determined;Wheelchair (measurements) Equipment Details: 20x18 w/c  Skilled Therapeutic Intervention Patient received in bed & agreeable to tx, no c/o pain reported. PT evaluation initiated with therapist educating pt on daily therapy schedule, weekly interdisciplinary team meeting, safety precautions (only staff are able to assist her out of bed or out of w/c), and other various CIR information. Pt requires max assist for sit<>supine with HOB elevated & bed rails with encouragement to attempt to move body to EOB. Pt attempts stand pivot transfer bed>w/c but requires total assist to prevent LOB and return to bed as pt unable to control body. Utilized stedy lift to transfer pt to w/c and pt requires max assist for sit>stand and pulls to standing with stedy bar. Transitioned to rail in hallway and attempted to take a step but pt unable to move RLE and when therapist blocks RLE to prevent buckling pt unable to advance LLE 2/2 decreased ability to weight bear through RLE. Pt stood in standing frame ~7 minutes with task focusing on BLE weight bearing for strengthening & NMR while therapist adjusted w/c to lower height to allow pt increased ease of w/c use. Also  provided pt with R half lap tray. Pt requires max assist for anterior weight shifting and to scoot buttocks back in w/c seat. At end of session pt left sitting in w/c in room with daughter (arrived at end of session) present to supervise.  PT Evaluation Precautions/Restrictions Precautions Precautions: Fall Precaution Comments: R hemi, R inattention Restrictions Weight Bearing Restrictions: No  General Chart Reviewed: Yes Additional Pertinent History: CHF, PAF, CKD stage 2, COPD, HTN, HLD, anxiety/depression, arthritis, MI Response to Previous Treatment: Not applicable Family/Caregiver Present: (daughter arrived at end of session)   Home Living/Prior [place] Available Help at Discharge: Family Type of Home: House Home Access: Stairs to enter [employer] of Steps: 2-3 steps to enter house with rails, 2 steps down to den but pt reports she does not have to go down there, pt states husband can install L rail at home entry Entrance Stairs-Rails: None(pt states husband can install L rail) Home Layout: One level  Lives With: Spouse Prior Function Level of Independence: Independent with basic ADLs;Independent with homemaking with ambulation;Independent with gait;Independent with transfers(community ambulator without AD)  Able to Take Stairs?: Yes Driving: Yes  Vision/Perception  Per pt she does not wear glasses/contacts at baseline. Pt reports decreased peripheral vision but denies diplopia. To be assessed further in functional context.    Cognition Overall Cognitive Status: Impaired/Different from baseline Arousal/Alertness: Awake/alert Orientation Level: Oriented X4 Safety/Judgment: Impaired  Pt reports her breakfast was not set up in front of her this morning although when PT entered pt had breakfast tray by her - unsure if this is due to inattention or poor recall of situation.  Sensation Sensation Light Touch: Impaired Detail Light  Touch Impaired  Details: Impaired RLE Proprioception: Appears Intact(BLE) Coordination Gross Motor Movements are Fluid and Coordinated: No Fine Motor Movements are Fluid and Coordinated: No  Motor  Motor Motor: Hemiplegia;Abnormal postural alignment and control Motor - Skilled Clinical Observations: generalized deconditioning   Mobility Bed Mobility Bed Mobility: Supine to Sit;Sit to Supine(with hospital bed features) Supine to Sit: Maximal Assistance - Patient - Patient 25-49% Sit to Supine: Maximal Assistance - Patient 25-49% Transfers Transfers: Sit to Stand;Stand Pivot Transfers Sit to Stand: Maximal Assistance - Patient 25-49% Stand Pivot Transfers: Total Assistance - Patient < 25%(unsafe 2/2 LOB & muscle weakness, therapist provides total assist to return to sitting on EOB for safety)  Locomotion  Gait Ambulation: No(attempted gait but pt unable to weight bear through RLE to take a step with L rail ) Gait Gait: No Stairs / Additional Locomotion Stairs: No Wheelchair Mobility Wheelchair Mobility: No   Trunk/Postural Assessment  Thoracic Assessment Thoracic Assessment: (rounded shoulders) Lumbar Assessment Lumbar Assessment: (posterior pelvic tilt) Postural Control Postural Control: (righting reactions & postural control delayed/impaired)   Balance Balance Balance Assessed: Yes Static Sitting Balance Static Sitting - Balance Support: Bilateral upper extremity supported;Feet supported Static Sitting - Level of Assistance: 5: Stand by assistance Static Sitting - Comment/# of Minutes: 3  Extremity Assessment  RUE Assessment RUE Assessment: Exceptions to [facility] Passive Range of Motion (PROM) Comments: WFL Active Range of Motion (AROM) Comments: flaccid General Strength Comments: flaccid  LUE Assessment LUE Assessment: Within Functional Limits  RLE Assessment Passive Range of Motion (PROM) Comments: WFL General Strength Comments: flaccid  LLE Assessment LLE Assessment:  Within Functional Limits   See Function Navigator for Current Functional Status.   Refer to Care Plan for Long Term Goals  Recommendations for other services: Neuropsych  Discharge Criteria: Patient will be discharged from PT if patient refuses treatment 3 consecutive times without medical reason, if treatment goals not met, if there is a change in medical status, if patient makes no progress towards goals or if patient is discharged from hospital.  The above assessment, treatment plan, treatment alternatives and goals were discussed and mutually agreed upon: by patient  Waunita Schooner 11/02/2017, 11:00 AM

## 2017-11-02 NOTE — Progress Notes (Signed)
Subjective/Complaints:   No new problems overnite  ROS-  Neg CP, SOB, N/V/D  Objective: Vital Signs: Blood pressure 125/81, pulse 85, temperature 98.9 F (37.2 C), temperature source Oral, resp. rate 18, SpO2 96 %. No results found. Results for orders placed or performed during the hospital encounter of 11/01/17 (from the past 72 hour(s))  CBC WITH DIFFERENTIAL     Status: Abnormal   Collection Time: 11/02/17  7:17 AM  Result Value Ref Range   WBC 16.9 (H) 4.0 - 10.5 K/uL   RBC 4.81 3.87 - 5.11 MIL/uL   Hemoglobin 14.4 12.0 - 15.0 g/dL   HCT 45.1 36.0 - 46.0 %   MCV 93.8 78.0 - 100.0 fL   MCH 29.9 26.0 - 34.0 pg   MCHC 31.9 30.0 - 36.0 g/dL   RDW 13.6 11.5 - 15.5 %   Platelets 254 150 - 400 K/uL   Neutrophils Relative % 66 %   Neutro Abs 11.1 (H) 1.7 - 7.7 K/uL   Lymphocytes Relative 23 %   Lymphs Abs 3.9 0.7 - 4.0 K/uL   Monocytes Relative 9 %   Monocytes Absolute 1.5 (H) 0.1 - 1.0 K/uL   Eosinophils Relative 1 %   Eosinophils Absolute 0.2 0.0 - 0.7 K/uL   Basophils Relative 1 %   Basophils Absolute 0.1 0.0 - 0.1 K/uL   Immature Granulocytes 0 %   Abs Immature Granulocytes 0.1 0.0 - 0.1 K/uL    Comment: Performed at [facility], [address] [place], [place] [address]     HEENT: normal Cardio: RRR and no murmur Resp: CTA B/L and Unlabored GI: BS positive and NT, ND Extremity:  Pulses positive and No Edema Skin:   Intact Neuro: Alert/Oriented, Normal Sensory, Abnormal Motor 0/5 on RIght side and Dysarthric Musc/Skel:  Other no pain in UE or LE Gen NAD   Assessment/Plan: 1. 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 Physiatrist is providing close team supervision and 24 hour management of active medical problems listed below. Physiatrist and rehab team continue to assess barriers to discharge/monitor patient progress  toward functional and medical goals. FIM:                   Function - Comprehension Comprehension: Auditory Comprehension assist level: Understands basic 75 - 89% of the time/ requires cueing 10 - 24% of the time  Function - Expression Expression: Verbal Expression assist level: Expresses basic 75 - 89% of the time/requires cueing 10 - 24% of the time. Needs helper to occlude trach/needs to repeat words.  Function - Social Interaction Social Interaction assist level: Interacts appropriately 75 - 89% of the time - Needs redirection for appropriate language or to initiate interaction.  Function - Problem Solving Problem solving assist level: Solves basic 50 - 74% of the time/requires cueing 25 - 49% of the time  Function - Memory Memory assist level: Recognizes or recalls 50 - 74% of the time/requires cueing 25 - 49% of the time Patient normally able to recall (first 3 days only): That he or she is in a hospital, Staff names and faces, Location of own room, Current season  Medical Problem List and Plan: 1.  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 Rehab evals today 2.  DVT Prophylaxis/Anticoagulation: Eliquis resumed 3. Pain Management: Topamax 100 mg twice daily, oxycodone as needed 4. Mood: Xanax 1 mg nightly 5. Neuropsych: This patient is capable of making decisions on her own behalf. 6. Skin/Wound Care: Routine skin checks 7. Fluids/Electrolytes/Nutrition: Routine in and outs with follow-up chemistries 8.  Diastolic congestive heart failure.  Demadex 20 mg daily.  Monitor for any signs of fluid overload 9.  PAF.  Eliquis resumed.  Cardiac rate controlled 10.  Hypertension.  Permissive hypertension.  Monitor with increased mobility Vitals:   11/01/17 2005 11/02/17 0409  BP: 134/87 125/81  Pulse: 97 85  Resp: 18 18   Temp: 98.8 F (37.1 C) 98.9 F (37.2 C)  SpO2: 96%   Controlled 9/17      11.  COPD.  Check oxygen saturations every shift.  Continue nebulizers- no wheezing 12.  CKD stage II.  Follow-up chemistries 13.  Hypothyroidism.  TSH 1.882. Synthroid 14.  Hyperlipidemia.  Lipitor/Lovaza 15.  GERD.  Protonix 16.  Constipation.  Laxative assistance  LOS (Days) 1 A FACE TO FACE EVALUATION WAS PERFORMED  Charlett Blake 11/02/2017, 7:56 AM

## 2017-11-02 NOTE — Discharge Instructions (Signed)
.rInpatient Rehab Discharge Instructions  Holly Flores Discharge date and time: No discharge date for patient encounter.   Activities/Precautions/ Functional Status: Activity: activity as tolerated Diet: soft Wound Care: none needed Functional status:  ___ No restrictions     ___ Walk up steps independently ___ 24/7 supervision/assistance   ___ Walk up steps with assistance ___ Intermittent supervision/assistance  ___ Bathe/dress independently ___ Walk with walker     _x STROKE/TIA DISCHARGE INSTRUCTIONS SMOKING Cigarette smoking nearly doubles your risk of having a stroke & is the single most alterable risk factor  If you smoke or have smoked in the last 12 months, you are advised to quit smoking for your health.  Most of the excess cardiovascular risk related to smoking disappears within a year of stopping.  Ask you doctor about anti-smoking medications  [place] Quit Line: 1-800-QUIT NOW  Free Smoking Cessation Classes [phone]  CHOLESTEROL Know your levels; limit fat & cholesterol in your diet  Lipid Panel     Component Value Date/Time   CHOL 150 10/30/2017 0612   TRIG 128 10/30/2017 0612   HDL 33 (L) 10/30/2017 0612   CHOLHDL 4.5 10/30/2017 0612   VLDL 26 10/30/2017 0612   LDLCALC 91 10/30/2017 0612      Many patients benefit from treatment even if their cholesterol is at goal.  Goal: Total Cholesterol (CHOL) less than 160  Goal:  Triglycerides (TRIG) less than 150  Goal:  HDL greater than 40  Goal:  LDL (LDLCALC) less than 100   BLOOD PRESSURE American Stroke Association blood pressure target is less that 120/80 mm/Hg  Your discharge blood pressure is:  BP: 125/81  Monitor your blood pressure  Limit your salt and alcohol intake  Many individuals will require more than one medication for high blood pressure  DIABETES (A1c is a blood sugar average for last 3 months) Goal HGBA1c is under 7% (HBGA1c is blood sugar average for last 3 months)  Diabetes: No  known diagnosis of diabetes    Lab Results  Component Value Date   HGBA1C 6.1 (H) 10/30/2017     Your HGBA1c can be lowered with medications, healthy diet, and exercise.  Check your blood sugar as directed by your physician  Call your physician if you experience unexplained or low blood sugars.  PHYSICAL ACTIVITY/REHABILITATION Goal is 30 minutes at least 4 days per week  Activity: Increase activity slowly, Therapies: Physical Therapy: Home Health Return to work:   Activity decreases your risk of heart attack and stroke and makes your heart stronger.  It helps control your weight and blood pressure; helps you relax and can improve your mood.  Participate in a regular exercise program.  Talk with your doctor about the best form of exercise for you (dancing, walking, swimming, cycling).  DIET/WEIGHT Goal is to maintain a healthy weight  Your discharge diet is:  Diet Order            DIET DYS 3 Room service appropriate? Yes; Fluid consistency: Thin  Diet effective now              liquids Your height is:    Your current weight is:   Your Body Mass Index (BMI) is:     Following the type of diet specifically designed for you will help prevent another stroke.  Your goal weight range is:    Your goal Body Mass Index (BMI) is 19-24.  Healthy food habits can help reduce 3 risk factors for stroke:  High cholesterol, hypertension, and excess weight.  RESOURCES Stroke/Support Group:  Call [phone]   STROKE EDUCATION PROVIDED/REVIEWED AND GIVEN TO PATIENT Stroke warning signs and symptoms How to activate emergency medical system (call 911). Medications prescribed at discharge. Need for follow-up after discharge. Personal risk factors for stroke. Pneumonia vaccine given:  Flu vaccine given:  My questions have been answered, the writing is legible, and I understand these instructions.  I will adhere to these goals & educational materials that have been provided to me after my  discharge from the hospital.   __ Bathe/dress with assistance ___ Walk Independently    ___ Shower independently ___ Walk with assistance    ___ Shower with assistance ___ No alcohol     ___ Return to work/school ________  Special Instructions:    My questions have been answered and I understand these instructions. I will adhere to these goals and the provided educational materials after my discharge from the hospital.  Patient/Caregiver Signature _______________________________ Date __________  Clinician Signature _______________________________________ Date __________  Please bring this form and your medication list with you to all your follow-up doctor's appointments.     Information on my medicine - ELIQUIS (apixaban)  Why was Eliquis prescribed for you? Eliquis was prescribed for you to reduce the risk of a blood clot forming that can cause a stroke if you have a medical condition called atrial fibrillation (a type of irregular heartbeat).  What do You need to know about Eliquis ? Take your Eliquis TWICE DAILY - one tablet in the morning and one tablet in the evening with or without food. If you have difficulty swallowing the tablet whole please discuss with your pharmacist how to take the medication safely.  Take Eliquis exactly as prescribed by your doctor and DO NOT stop taking Eliquis without talking to the doctor who prescribed the medication.  Stopping may increase your risk of developing a stroke.  Refill your prescription before you run out.  After discharge, you should have regular check-up appointments with your healthcare provider that is prescribing your Eliquis.  In the future your dose may need to be changed if your kidney function or weight changes by a significant amount or as you get older.  What do you do if you miss a dose? If you miss a dose, take it as soon as you remember on the same day and resume taking twice daily.  Do not take more than one  dose of ELIQUIS at the same time to make up a missed dose.  Important Safety Information A possible side effect of Eliquis is bleeding. You should call your healthcare provider right away if you experience any of the following: ? Bleeding from an injury or your nose that does not stop. ? Unusual colored urine (red or dark brown) or unusual colored stools (red or black). ? Unusual bruising for unknown reasons. ? A serious fall or if you hit your head (even if there is no bleeding).  Some medicines may interact with Eliquis and might increase your risk of bleeding or clotting while on Eliquis. To help avoid this, consult your healthcare provider or pharmacist prior to using any new prescription or non-prescription medications, including herbals, vitamins, non-steroidal anti-inflammatory drugs (NSAIDs) and supplements.  This website has more information on Eliquis (apixaban): http://www.eliquis.com/eliquis/home

## 2017-11-03 ENCOUNTER — Inpatient Hospital Stay (HOSPITAL_COMMUNITY): Payer: Medicare Other | Admitting: Physical Therapy

## 2017-11-03 ENCOUNTER — Inpatient Hospital Stay (HOSPITAL_COMMUNITY): Payer: Medicare Other | Admitting: Occupational Therapy

## 2017-11-03 ENCOUNTER — Inpatient Hospital Stay (HOSPITAL_COMMUNITY): Payer: Medicare Other | Admitting: Speech Pathology

## 2017-11-03 ENCOUNTER — Other Ambulatory Visit: Payer: Self-pay

## 2017-11-03 LAB — URINE CULTURE

## 2017-11-03 NOTE — Progress Notes (Signed)
Inpatient [facility] Individual Statement of Services  Patient Name:  Holly Flores  Date:  11/03/2017  Welcome to the [facility].  Our goal is to provide you with an individualized program based on your diagnosis and situation, designed to meet your specific needs.  With this comprehensive rehabilitation program, you will be expected to participate in at least 3 hours of rehabilitation therapies Monday-Friday, with modified therapy programming on the weekends.  Your rehabilitation program will include the following services:  Physical Therapy (PT), Occupational Therapy (OT), Speech Therapy (ST), 24 hour per day rehabilitation nursing, Neuropsychology, Case Management (Social Worker), Rehabilitation Medicine, Nutrition Services and Pharmacy Services  Weekly team conferences will be held on Wednesdays to discuss your progress.  Your Social Worker will talk with you frequently to get your input and to update you on team discussions.  Team conferences with you and your family in attendance may also be held.  Expected length of stay:  3 weeks (targeted discharge date 11-23-17)  Overall anticipated outcome:  Minimal assistance overall; moderate assistance for ambulation  Depending on your progress and recovery, your program may change. Your Social Worker will coordinate services and will keep you informed of any changes. Your Social Worker's name and contact numbers are listed  below.  The following services may also be recommended but are not provided by the [facility] will be made to provide these services after discharge if needed.  Arrangements include referral to agencies that provide these services.  Your insurance has been verified to be:  Medicare and [employer] Your primary doctor is:  Dr. Octavio Graves  Pertinent  information will be shared with your doctor and your insurance company.  Social Worker:  Alfonse Alpers, LCSW  [phone] or ([phone]  Information discussed with and copy given to patient by: Trey Sailors, 11/03/2017, 10:52 AM

## 2017-11-03 NOTE — Progress Notes (Signed)
Physical Therapy Session Note  Patient Details  Name: Holly Flores MRN: [id number] Date of Birth: 09/23/1945  Today's Date: 11/03/2017 PT Individual Time: 0900-1000 PT Individual Time Calculation (min): 60 min   Short Term Goals: Week 1:  PT Short Term Goal 1 (Week 1): Pt will complete bed mobility with mod assist +1. PT Short Term Goal 2 (Week 1): Pt will consistently complete bed<>w/c transfers with max assist +1. PT Short Term Goal 3 (Week 1): Pt will propel w/c 30 ft with min assist.  Skilled Therapeutic Interventions/Progress Updates:     Patient received in bed with grand-daughter present, very pleasant and willing to participate in PT session. She was able to complete functional bed mobility with ModAx1 this morning and able to maintain upright unsupported sitting at EOB with S. She required MaxA for sit to stand in [place] lift, mod cues for hip extension and coming fully upright with her trunk, and was dependent for stand-pivot transfer to [facility] in the [place]. Able to mobilize WC 2 bouts of approximately 37f with Min-ModA for straight line navigation, limited by fatigue this afternoon. Otherwise worked on static standing and pre-gait activities with mod-max facilitation for R quad activation during static stance and during weight shifts at the side of the parallel bars. Education provided regarding mechanisms of recovery following CVA as well as importance of active weight bearing through effected limbs during rehab to help promote recovery from CVA, also briefly discussed use of VR in CVA rehab. Patient was transported back [place] her [place] need to [place], and [facility]. She was left upright on bedside commode with nursing staff present and attending, all needs otherwise met.   Therapy Documentation Precautions:  Precautions Precautions: Fall Precaution Comments: R hemi, R inattention Restrictions Weight  Bearing Restrictions: No General:   Vital Signs:   Pain: Pain Assessment Pain Scale: 0-10 Pain Score: 0-No pain   See Function Navigator for Current Functional Status.   Therapy/Group: Individual Therapy  KDeniece ReePT, DPT, CBIS  Supplemental Physical Therapist [facility]   Pager [phone] Rehab Office [phone]  11/03/2017, 12:43 PM

## 2017-11-03 NOTE — Progress Notes (Signed)
Physical Therapy Session Note  Patient Details  Name: Holly Flores MRN: [id number] Date of Birth: 05/27/1945  Today's Date: 11/03/2017 PT Individual Time: 1400-1450 PT Individual Time Calculation (min): 50 min   Short Term Goals: Week 1:  PT Short Term Goal 1 (Week 1): Pt will complete bed mobility with mod assist +1. PT Short Term Goal 2 (Week 1): Pt will consistently complete bed<>w/c transfers with max assist +1. PT Short Term Goal 3 (Week 1): Pt will propel w/c 30 ft with min assist.  Skilled Therapeutic Interventions/Progress Updates:    Patient received up in [facility], pleasant and willing to participate in PT session but fatigued this afternoon. Able to self-propel WC approximately 67f with ModA for straight line navigation, then taken [facility] therapy [facility]. Continued to focus on NMR based activities in standing with Mod-Max facilitation for R quad activation and weight bearing through R LE at parallel bars, VC and TC to reduce weight shift to L for midline standing and for postural corrections. Able to increase weight bearing through R LE as evidenced by ability to shift to right LE long enough to ABD/ADD L LE through small range of motion while holding onto parallel bars. Patient very fatigued at this point, so [place] performed stand-pivot transfer dependent with [place] and MaxA to come to full standing position in lift. While sitting at EOB, worked on lateral trunk leans to elbow for increased proprioception, core activation, and WB through R UE, as well as cross midline reaches with L UE down to R ankle for improved anterior translation with transfers and activation of R side musculature, balance. She required ModA for return to supine and repositioning in bed at EOS, and was left in bed with all needs met, alarm activated.   Therapy Documentation Precautions:  Precautions Precautions: Fall Precaution Comments: R hemi, R  inattention Restrictions Weight Bearing Restrictions: No General:   Pain: Pain Assessment Pain Scale: 0-10 Pain Score: 0-No pain   See Function Navigator for Current Functional Status.   Therapy/Group: Individual Therapy   KDeniece ReePT, DPT, CBIS  [facility]   Pager [phone] Rehab Office [phone]   11/03/2017, 3:49 PM

## 2017-11-03 NOTE — Progress Notes (Signed)
Speech Language Pathology Daily Session Note  Patient Details  Name: Holly Flores MRN: [id number] Date of Birth: 1945/12/02  Today's Date: 11/03/2017 SLP Individual Time: 1030-1100 SLP Individual Time Calculation (min): 30 min  Short Term Goals: Week 1: SLP Short Term Goal 1 (Week 1): Pt will utilizie speech intelligibiity strategies at sentence level with Mod A verbal cues to achieve 80% intelligibility. SLP Short Term Goal 2 (Week 1): Pt will demonstrate functional problem solving for mildly complex tasks with Mod A verbal cues. SLP Short Term Goal 3 (Week 1): Pt will self-monitor and self-correct functional errors with mod A verbal cues. SLP Short Term Goal 4 (Week 1): Pt will demonstrate selective attention during functional tasks for 30 minutes with Min A verbal cues for redirection. SLP Short Term Goal 5 (Week 1): Pt will utilize compensatory strategies to recall new, daily information with Min A verbal and question cues. SLP Short Term Goal 6 (Week 1): Pt will consume regular texture trials without overt s/s aspiration with Mod I for use of swallowing compensatory strategies (oral clearance.)  Skilled Therapeutic Interventions: Skilled treatment session focused on speech goals. SLP facilitated session by providing auditory feedback of patient's current speech intelligibility at the phrase level to maximize intellectual awareness of deficits during a verbal description task. Patient utilized a slow rate, increased vocal intensity and over-articulation with Min A verbal cues to achieve 90% intelligibility. Intelligibility was also impacted by decreased breath support and educated in regards to diaphragmatic breathing.  Patient required Max A for utilization while upright in the wheelchair. Patient left upright in wheelchair with family present and all needs within reach. Continue with current plan of care.      Function:   Cognition Comprehension Comprehension assist level: Follows  basic conversation/direction with no assist  Expression   Expression assist level: Expresses basic 75 - 89% of the time/requires cueing 10 - 24% of the time. Needs helper to occlude trach/needs to repeat words.  Social Interaction Social Interaction assist level: Interacts appropriately 75 - 89% of the time - Needs redirection for appropriate language or to initiate interaction.  Problem Solving Problem solving assist level: Solves basic 75 - 89% of the time/requires cueing 10 - 24% of the time  Memory Memory assist level: Recognizes or recalls 50 - 74% of the time/requires cueing 25 - 49% of the time    Pain Pain Assessment Pain Scale: 0-10 Pain Score: 0-No pain  Therapy/Group: Individual Therapy  Adysen Raphael 11/03/2017, 12:25 PM

## 2017-11-03 NOTE — Progress Notes (Signed)
Social Work Assessment and Plan  Patient Details  Name: Holly Flores MRN: [id number] Date of Birth: 1945-06-04  Today's Date: 11/02/2017  Problem List:  Patient Active Problem List   Diagnosis Date Noted  . 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 Acute CVA (cerebrovascular accident) ([place])   . Slow transit constipation   . CKD (chronic kidney disease), stage II   . PAF (paroxysmal atrial fibrillation) ([place])   . Dysphasia, post-stroke   . 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 COPD (chronic obstructive pulmonary disease) ([place]) 10/29/2017  . Chronic diastolic CHF (congestive heart failure) ([place]) 10/29/2017  . Hypothyroidism 10/29/2017  . Essential hypertension 10/29/2017  . GERD (gastroesophageal reflux disease) 10/29/2017  . Anxiety 10/29/2017  . Taking medication for chronic disease 05/18/2017  . Central retinal vein occlusion of left eye 01/22/2015  . Degenerative disc disease, cervical 01/22/2015  . HA (headache) 12/30/2014  . CVA (cerebral vascular accident) ([place]) 12/30/2014  . CVA (cerebral infarction) 12/30/2014  . History of colonic polyps 05/14/2014  . 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 Chest pain 04/20/2013  . Dyspnea 04/20/2013  . Hyperlipidemia 04/20/2013   Past Medical History:  Past Medical History:  Diagnosis Date  . Anxiety   . Arthritis   . CHF (congestive heart failure) ([place])   . COPD (chronic obstructive pulmonary disease) ([place])   . Depression   . GERD (gastroesophageal reflux disease)   . Goiter   . Hyperlipidemia   . Hypertension   . Hypothyroidism   . Kidney cysts    left side  . Myocardial infarction ([place])    8 yrs ago.   Marland Kitchen Shortness of breath   . Sleep apnea    uses CPAP, 3  . Stroke ([place])   . Stroke ([place])    OCULAR  LEFT  EYE    LAST YR.   Past Surgical History:  Past Surgical History:  Procedure Laterality Date  . ABDOMINAL HYSTERECTOMY    . BACK SURGERY     x3,cervical and lunmbar, disc.  Marland Kitchen  BREAST BIOPSY     left-non cancerous  . CARDIAC CATHETERIZATION    . CARPAL TUNNEL RELEASE Left   . CATARACT EXTRACTION W/PHACO  09/21/2011   Procedure: CATARACT EXTRACTION PHACO AND INTRAOCULAR LENS PLACEMENT (IOC);  Surgeon: Williams Che, MD;  Location: [facility];  Service: Ophthalmology;  Laterality: Left;  CDE:9.78  . CATARACT EXTRACTION W/PHACO Right 10/15/2014   Procedure: CATARACT EXTRACTION PHACO AND INTRAOCULAR LENS PLACEMENT ([place]);  Surgeon: Williams Che, MD;  Location: [facility];  Service: Ophthalmology;  Laterality: Right;  CDE:4.19  . CERVICAL SPINE SURGERY    . CESAREAN SECTION     x2  . CHOLECYSTECTOMY    . COLONOSCOPY  2006   RMR: 1. Internal hemorroids, otherwise normal rectum. 2. Pedunculated polyp at 35 cm. reomved with snare. The remainder of teh colonic mucosa appeared normal.   . COLONOSCOPY  2011   Dr. Gala Romney: multiple ascending colon polyps and rectal polyp, adenomatous  . COLONOSCOPY N/A 05/31/2014   Procedure: COLONOSCOPY;  Surgeon: Daneil Dolin, MD;  Location[facility];  Service: Endoscopy;  Laterality: N/A;  130  . COLONOSCOPY WITH PROPOFOL N/A 06/24/2017   Procedure: COLONOSCOPY WITH PROPOFOL;  Surgeon: Daneil Dolin, MD;  [facility];  Service: Endoscopy;  Laterality: N/A;  2:15pm  . ESOPHAGOGASTRODUODENOSCOPY  2006   Dr. Gala Romney: Subtle Schatzkis ring, otherwise normal upper GI tract, aside from a  small pyloric channell erosion, status post dilation as described above.   Marland Kitchen KNEE SURGERY Bilateral   . POLYPECTOMY  06/24/2017   Procedure: POLYPECTOMY;  Surgeon: Daneil Dolin, MD;  [facility];  Service: Endoscopy;;  ascending  . TONSILLECTOMY    . TOTAL KNEE ARTHROPLASTY Right 05/24/2013   DR MURPHY  . TOTAL KNEE ARTHROPLASTY Right 05/24/2013   Procedure: TOTAL KNEE ARTHROPLASTY;  Surgeon: [name], MD;  Location: [facility];  Service: Orthopedics;  Laterality: Right;   Social History:  reports that she quit smoking about 30 years  ago. Her smoking use included cigarettes. She has a 5.00 pack-year smoking history. She has never used smokeless tobacco. She reports that she drank alcohol. She reports that she does not use drugs.  Family / Support Systems Marital Status: Married How Long?: 108 years Patient Roles: Spouse, Parent, Other (Comment)(grandparent; great grandparent) Spouse/Significant Other: Shanitra Phillippi - husband - [phone] (h); [phone] (m) Children: Lluvia Gwynne - son - [phone]; Lan Entsminger - dtr - [phone] Other Supports: Alonia Dibuono - granddtr - [phone] Anticipated Caregiver: Spouse, daughter, granddaughter (son lives very nearby) Ability/Limitations of Caregiver: Spouse can do light assist  Caregiver Availability: 24/7 Family Dynamics: close, supportive family  Social History Preferred language: English Religion: Baptist Read: Yes Write: Yes Employment Status: Retired Date Retired/Disabled/Unemployed: 24 Age Retired: 11 Public relations account executive Issues: none reported Guardian/Conservator: N/A - MD has determined that pt is capable of making her own decisions.   Abuse/Neglect Abuse/Neglect Assessment Can Be Completed: Yes Physical Abuse: Denies Verbal Abuse: Denies Sexual Abuse: Denies Exploitation of patient/patient's resources: Denies Self-Neglect: Denies  Emotional Status Pt's affect, behavior and adjustment status: Pt reports feeling well emotionally currently and that taking Xanax helps her with anxiety.  She stated she will let CSW/staff or family know if her mood worsens. Recent Psychosocial Issues: none reported Psychiatric History: Pt with hx of anxiety and depression. Substance Abuse History: none reported  Patient / Family Perceptions, Expectations & Goals Pt/Family understanding of illness & functional limitations: Pt/family report an understanding of pt's condition/limitations. Premorbid pt/family roles/activities: Pt enjoys  cooking and takes care of the inside of her house.  She drives and goes out daily.  She and her husband take drives and [place]. Anticipated changes in roles/activities/participation: Pt would like to resume activities as she is able. Pt/family expectations/goals: Pt wants to be able to cook again and to get her arm and leg working again.  Community [employer] Agencies: None Premorbid Home Care/DME Agencies: Other (Comment)(Pt had HH in the past for her knee surgery, but cannot remember the agency.  Pt has a cane, rolling walker, bedside commode, wheelchair, and shower seat at home.) Transportation available at discharge: family Resource referrals recommended: Neuropsychology, Support group (specify)  Discharge Planning Living Arrangements: Spouse/significant other Support Systems: Spouse/significant other, Children, Other relatives Type of Residence: Private residence Insurance Resources: [employer], Multimedia programmer (specify)([facility]) Financial Resources: Radio broadcast assistant Screen Referred: No Money Management: Patient, Spouse Does the patient have any problems obtaining your medications?: No Home Management: Pt takes care of inside household chores and husband takes care of outside chores. Patient/Family Preliminary Plans: Pt plans to go home with family to provide assistance as needed. Social Work Anticipated Follow Up Needs: HH/OP, Support Group(stroke support group) Expected length of stay: 3 weeks  Clinical Impression CSW met with pt, pt's husband, and pt's dtr to introduce self and role  of CSW, as well as to complete assessment.  Pt has good family support and they will be with pt when she goes home.  Pt/family are glad that she was able to come to [facility] and pt is motivated to get better.  She acknowledged that she feels she did better during OT session, as her early morning PT session she did not feel good about.  Pt reports being  tired, but feels okay emotionally.  Pt's granddtr is already beginning to prepare pt's home for her return.  No current concerns/questions/needs at this time.  CSW will continue to follow and assist as needed.  Eduarda Scrivens, Silvestre Mesi 11/03/2017, 11:53 AM

## 2017-11-03 NOTE — Progress Notes (Signed)
Subjective/Complaints:   Had some diarrhea and dysuria yesterday, feels ok today , no sweats/chills,  Per RN no fever reported  ROS-  Neg CP, SOB, N/V/D  Objective: Vital Signs: Blood pressure 137/84, pulse 80, temperature 98.6 F (37 C), resp. rate 18, SpO2 97 %. Dg Chest 2 View  Result Date: 11/02/2017 CLINICAL DATA:  Cough EXAM: CHEST - 2 VIEW COMPARISON:  Chest radiograph January 16, 2015 and chest CT May 20, 2017 FINDINGS: There is no edema or consolidation. Heart is mildly enlarged with pulmonary vascularity normal. No adenopathy. There is postoperative change in the lower cervical region. IMPRESSION: Mild cardiac enlargement.  No edema or consolidation. Electronically Signed   By: Lowella Grip III M.D.   On: 11/02/2017 09:52   Results for orders placed or performed during the hospital encounter of 11/01/17 (from the past 72 hour(s))  CBC WITH DIFFERENTIAL     Status: Abnormal   Collection Time: 11/02/17  7:17 AM  Result Value Ref Range   WBC 16.9 (H) 4.0 - 10.5 K/uL   RBC 4.81 3.87 - 5.11 MIL/uL   Hemoglobin 14.4 12.0 - 15.0 g/dL   HCT 45.1 36.0 - 46.0 %   MCV 93.8 78.0 - 100.0 fL   MCH 29.9 26.0 - 34.0 pg   MCHC 31.9 30.0 - 36.0 g/dL   RDW 13.6 11.5 - 15.5 %   Platelets 254 150 - 400 K/uL   Neutrophils Relative % 66 %   Neutro Abs 11.1 (H) 1.7 - 7.7 K/uL   Lymphocytes Relative 23 %   Lymphs Abs 3.9 0.7 - 4.0 K/uL   Monocytes Relative 9 %   Monocytes Absolute 1.5 (H) 0.1 - 1.0 K/uL   Eosinophils Relative 1 %   Eosinophils Absolute 0.2 0.0 - 0.7 K/uL   Basophils Relative 1 %   Basophils Absolute 0.1 0.0 - 0.1 K/uL   Immature Granulocytes 0 %   Abs Immature Granulocytes 0.1 0.0 - 0.1 K/uL    Comment: Performed at [facility], [address] [place], [place] [address]  Comprehensive metabolic panel     Status: Abnormal   Collection Time: 11/02/17  7:17 AM  Result Value Ref Range   Sodium 144 135 - 145 mmol/L   Potassium 3.5 3.5 - 5.1 mmol/L   Chloride 108 98 - 111 mmol/L   CO2 23 22 - 32 mmol/L   Glucose, Bld 126 (H) 70 - 99 mg/dL   BUN 22 8 - 23 mg/dL   Creatinine, Ser 1.69 (H) 0.44 - 1.00 mg/dL   Calcium 9.6 8.9 - 10.3 mg/dL   Total Protein 7.5 6.5 - 8.1 g/dL   Albumin 3.5 3.5 - 5.0 g/dL   AST 19 15 - 41 U/L   ALT 16 0 - 44 U/L   Alkaline Phosphatase 86 38 - 126 U/L   Total Bilirubin 0.6 0.3 - 1.2 mg/dL   GFR calc non Af Amer 29 (L) >60 mL/min   GFR calc Af Amer 34 (L) >60 mL/min    Comment: (NOTE) The eGFR has been calculated using the CKD EPI equation. This calculation has not been validated in all clinical situations. eGFR's persistently <60 mL/min signify possible Chronic Kidney Disease.    Anion gap 13 5 - 15    Comment: Performed at [facility] [address], [place], [place] [address]  Urine Culture     Status: Abnormal   Collection Time: 11/02/17 10:56 AM  Result Value Ref Range  Specimen Description URINE, CLEAN CATCH    Special Requests      NONE Performed at [facility], [place] [address], [place], [place] [address]    Culture MULTIPLE SPECIES PRESENT, SUGGEST RECOLLECTION (A)    Report Status 11/03/2017 FINAL   Urinalysis, Routine w reflex microscopic     Status: Abnormal   Collection Time: 11/02/17 11:18 AM  Result Value Ref Range   Color, Urine YELLOW YELLOW   APPearance HAZY (A) CLEAR   Specific Gravity, Urine 1.021 1.005 - 1.030   pH 5.0 5.0 - 8.0   Glucose, UA NEGATIVE NEGATIVE mg/dL   Hgb urine dipstick NEGATIVE NEGATIVE   Bilirubin Urine NEGATIVE NEGATIVE   Ketones, ur NEGATIVE NEGATIVE mg/dL   Protein, ur NEGATIVE NEGATIVE mg/dL   Nitrite NEGATIVE NEGATIVE   Leukocytes, UA TRACE (A) NEGATIVE   RBC / HPF 0-5 0 - 5 RBC/hpf   WBC, UA 11-20 0 - 5 WBC/hpf   Bacteria, UA RARE (A) NONE SEEN   Squamous Epithelial / LPF 0-5 0 - 5   Mucus PRESENT    Hyaline Casts, UA PRESENT     Comment: Performed at [facility], [place] [address], [place], [place] [address]     HEENT:  normal Cardio: RRR and no murmur Resp: CTA B/L and Unlabored GI: BS positive and NT, ND Extremity:  Pulses positive and No Edema Skin:   Intact Neuro: Alert/Oriented, Normal Sensory, Abnormal Motor 0/5 on RIght side and Dysarthric Musc/Skel:  Other no pain in UE or LE Gen NAD   Assessment/Plan: 1. 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 Physiatrist is providing close team supervision and 24 hour management of active medical problems listed below. Physiatrist and rehab team continue to assess barriers to discharge/monitor patient progress toward functional and medical goals. FIM: Function - Bathing Position: Bed Body parts bathed by patient: Chest, Abdomen, Right upper leg, Left upper leg Body parts bathed by helper: Right arm, Left arm, Front perineal area, Buttocks, Right lower leg, Left lower leg, Back Assist Level: 2 helpers  Function- Upper Body Dressing/Undressing What is the patient wearing?: Button up shirt Button up shirt - Perfomed by patient: Thread/unthread left sleeve Button up shirt - Perfomed by helper: Thread/unthread right sleeve, Button/unbutton shirt, Pull shirt around back Assist Level: (Max assist) Function - Lower Body Dressing/Undressing What is the patient wearing?: Pants, Non-skid slipper socks Position: (sit > stand in [place]) Pants- Performed by helper: Thread/unthread right pants leg, Thread/unthread left pants leg, Pull pants up/down Non-skid slipper socks- Performed by helper: Don/doff right sock, Don/doff left sock Assist for footwear: Maximal assist Assist for lower body dressing: 2 Helpers  Function - Toileting Toileting steps completed by helper: Adjust clothing prior to toileting, Performs perineal hygiene, Adjust clothing after toileting Assist level: Two helpers  Function - Air cabin crew transfer  assistive device: Bedside commode, Mechanical lift Mechanical lift: Stedy Assist level to bedside commode (at bedside): 2 helpers Assist level from bedside commode (at bedside): 2 helpers  Function - Chair/bed transfer Chair/bed transfer method: Stand pivot Chair/bed transfer assist level: 2 helpers Chair/bed transfer assistive device: Facilities manager lift: Stedy  Function - Locomotion: Wheelchair Will patient use wheelchair at discharge?: Yes Type: Manual Wheelchair activity did not occur: Safety/medical concerns Wheel 50 feet with 2 turns activity did not occur: Safety/medical concerns Wheel 150 feet activity did not  occur: Safety/medical concerns Function - Locomotion: Ambulation Ambulation activity did not occur: Safety/medical concerns Walk 10 feet activity did not occur: Safety/medical concerns Walk 50 feet with 2 turns activity did not occur: Safety/medical concerns Walk 150 feet activity did not occur: Safety/medical concerns Walk 10 feet on uneven surfaces activity did not occur: Safety/medical concerns  Function - Comprehension Comprehension: Auditory Comprehension assist level: Follows basic conversation/direction with no assist  Function - Expression Expression: Verbal Expression assist level: Expresses basic 50 - 74% of the time/requires cueing 25 - 49% of the time. Needs to repeat parts of sentences.  Function - Social Interaction Social Interaction assist level: Interacts appropriately 75 - 89% of the time - Needs redirection for appropriate language or to initiate interaction.  Function - Problem Solving Problem solving assist level: Solves basic 75 - 89% of the time/requires cueing 10 - 24% of the time  Function - Memory Memory assist level: Recognizes or recalls 50 - 74% of the time/requires cueing 25 - 49% of the time Patient normally able to recall (first 3 days only): That he or she is in a hospital, Staff names and faces, Location of own room,  Current season  Medical Problem List and Plan: 1.  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 Team conference today please see physician documentation under team conference tab, met with team face-to-face to discuss problems,progress, and goals. Formulized individual treatment plan based on medical history, underlying problem and comorbidities. 2.  DVT Prophylaxis/Anticoagulation: Eliquis resumed 3. Pain Management: Topamax 100 mg twice daily, oxycodone as needed 4. Mood: Xanax 1 mg nightly 5. Neuropsych: This patient is capable of making decisions on her own behalf. 6. Skin/Wound Care: Routine skin checks 7. Fluids/Electrolytes/Nutrition: Routine in and outs with follow-up chemistries 8.  Diastolic congestive heart failure.  Demadex 20 mg daily.  Monitor for any signs of fluid overload 9.  PAF.  Eliquis resumed.  Cardiac rate controlled 10.  Hypertension.  Permissive hypertension.  Monitor with increased mobility Vitals:   11/02/17 1928 11/03/17 0529  BP: 129/87 137/84  Pulse: 95 80  Resp: 20 18  Temp: 98.7 F (37.1 C) 98.6 F (37 C)  SpO2: 96% 97%  Controlled 9/18      11.  COPD.  Check oxygen saturations every shift.  Continue nebulizers- no wheezing 12.  CKD stage II.  Follow-up chemistries 13.  Hypothyroidism.  TSH 1.882. Synthroid 14.  Hyperlipidemia.  Lipitor/Lovaza 15.  GERD.  Protonix 16.  Constipation.  Laxative assistance 17.  Leukocytosis with + UA, C and S contaminanted, check cath specimen LOS (Days) 2 A FACE TO FACE EVALUATION WAS PERFORMED  Charlett Blake 11/03/2017, 10:01 AM

## 2017-11-03 NOTE — Progress Notes (Signed)
Occupational Therapy Session Note  Patient Details  Name: Holly Flores MRN: [id number] Date of Birth: 01/03/1946  Today's Date: 11/03/2017 OT Individual Time: 1303-1400 OT Individual Time Calculation (min): 57 min    Short Term Goals: Week 1:  OT Short Term Goal 1 (Week 1): Pt will complete bathing with max assist of one caregiver at sit > stand level OT Short Term Goal 2 (Week 1): Pt will complete LB dressing with max assist of one caregiver at sit > stand level OT Short Term Goal 3 (Week 1): Pt will complete toilet transfer with max assist of one caregiver OT Short Term Goal 4 (Week 1): Pt will complete UB dressing with mod assist  Skilled Therapeutic Interventions/Progress Updates:    Treatment session with focus on RUE and trunk NMR.  Pt received upright in w/c with husband present asking about report from conference.  Swk arriving and informed pt and husband of d/c date and remained in room to discuss other recommendations with husband while pt went to therapy session.  Stand pivot transfer to Rt with focus on weight bearing through RLE while pt pivoted on LLE, therapist providing tactile cues and support at Rt knee.  Utilized step under feet to allow BLE support in sitting while engaging in lateral scoots and hip dissociation.  Pt required max assist for weight shifting and scooting.  Engaged in weight bearing through [place] in sitting while reaching across midline to further facilitate weight bearing.  Engaged in PROM to RUE in sidelying and supine with focus on facilitating shoulder and bicep activation.  Pt demonstrating minimal shoulder elevation with compensatory movements but no bicep activation.  Returned to sitting with max assist.  Educated on squat pivot transfer with pt able to scoot to Rt and then completed squat pivot with max assist +2 for safety.  Pt left upright in w/c awaiting hand off to PT.  Therapy Documentation Precautions:  Precautions Precautions: Fall Precaution  Comments: R hemi, R inattention Restrictions Weight Bearing Restrictions: No General:   Vital Signs: Therapy Vitals Pulse Rate: (!) 101 Resp: 18 BP: 123/88 Patient Position (if appropriate): Sitting Oxygen Therapy SpO2: 95 % O2 Device: Room Air Pain: Pain Assessment Pain Scale: 0-10 Pain Score: 0-No pain  See Function Navigator for Current Functional Status.   Therapy/Group: Individual Therapy  [name] 11/03/2017, 3:51 PM

## 2017-11-04 ENCOUNTER — Inpatient Hospital Stay (HOSPITAL_COMMUNITY): Payer: Medicare Other | Admitting: Occupational Therapy

## 2017-11-04 ENCOUNTER — Inpatient Hospital Stay (HOSPITAL_COMMUNITY): Payer: Medicare Other | Admitting: Physical Therapy

## 2017-11-04 ENCOUNTER — Inpatient Hospital Stay (HOSPITAL_COMMUNITY): Payer: Medicare Other | Admitting: Speech Pathology

## 2017-11-04 ENCOUNTER — Inpatient Hospital Stay (HOSPITAL_COMMUNITY): Payer: Medicare Other

## 2017-11-04 LAB — URINALYSIS, ROUTINE W REFLEX MICROSCOPIC
BILIRUBIN URINE: NEGATIVE
GLUCOSE, UA: NEGATIVE mg/dL
KETONES UR: NEGATIVE mg/dL
LEUKOCYTES UA: NEGATIVE
NITRITE: NEGATIVE
PH: 5 (ref 5.0–8.0)
Protein, ur: NEGATIVE mg/dL
Specific Gravity, Urine: 1.023 (ref 1.005–1.030)

## 2017-11-04 NOTE — IPOC Note (Signed)
Overall Plan of Care [facility]) Patient Details Name: Holly Flores MRN: [id number] DOB: 10-13-1945  Admitting Diagnosis: <principal problem not specified>  Hospital Problems: Active Problems:   CVA (cerebral vascular accident) ([place])   Left middle cerebral artery stroke [facility])     Functional Problem List: Nursing Bladder, Bowel, Endurance, Medication Management, Motor, Nutrition, Pain, Perception, Safety, Sensory  PT Balance, Behavior, Perception, Endurance, Safety, Motor, Sensory  OT Balance, Endurance, Motor, Perception, Safety, Sensory, Vision  SLP    TR         Basic ADL's: OT Eating, Grooming, Bathing, Dressing, Toileting     Advanced  ADL's: OT       Transfers: PT Bed Mobility, Bed to Chair, Car, Manufacturing systems engineer, Metallurgist: PT Stairs, Emergency planning/management officer, Ambulation     Additional Impairments: OT Fuctional Use of Upper Extremity  SLP Swallowing, Social Cognition, Communication expression Problem Solving, Attention, Memory, Awareness  TR      Anticipated Outcomes Item Anticipated Outcome  Self Feeding Supervision/setup  Swallowing  mod I   Basic self-care  Min assit  Toileting  Min assist   Bathroom Transfers Min assist  Bowel/Bladder  managed bowel and bladder BM every other day   Transfers  min assist  Locomotion  supervision w/c level  Communication  mod I  Cognition  Mod I  Pain  less than 2  Safety/Judgment  min assist    Therapy Plan: PT Intensity: Minimum of 1-2 x/day ,45 to 90 minutes PT Frequency: 5 out of 7 days PT Duration Estimated Length of Stay: 3 weeks OT Intensity: Minimum of 1-2 x/day, 45 to 90 minutes OT Frequency: 5 out of 7 days OT Duration/Estimated Length of Stay: 22-26 days SLP Intensity: Minumum of 1-2 x/day, 30 to 90 minutes SLP Frequency: 3 to 5 out of 7 days SLP Duration/Estimated Length of Stay: 22-24 days    Team Interventions: Nursing Interventions Patient/Family Education, Bladder  Management, Bowel Management, Disease Management/Prevention, Pain Management, Medication Management, Cognitive Remediation/Compensation, Dysphagia/Aspiration Precaution Training, Psychosocial Support, Discharge Planning  PT interventions Cognitive remediation/compensation, Discharge planning, Ambulation/gait training, DME/adaptive equipment instruction, Functional mobility training, Pain management, Psychosocial support, Splinting/orthotics, Therapeutic Activities, UE/LE Strength taining/ROM, Visual/perceptual remediation/compensation, Wheelchair propulsion/positioning, UE/LE Coordination activities, Therapeutic Exercise, Stair training, Skin care/wound management, Patient/family education, Neuromuscular re-education, Functional electrical stimulation, Disease management/prevention, Academic librarian, Training and development officer  OT Interventions Training and development officer, Academic librarian, Discharge planning, Disease mangement/prevention, Engineer, drilling, Functional electrical stimulation, Functional mobility training, Neuromuscular re-education, Pain management, Patient/family education, Psychosocial support, Self Care/advanced ADL retraining, Skin care/wound managment, Splinting/orthotics, Therapeutic Activities, Therapeutic Exercise, UE/LE Strength taining/ROM, UE/LE Coordination activities, Visual/perceptual remediation/compensation, Wheelchair propulsion/positioning  SLP Interventions Cognitive remediation/compensation, Cueing hierarchy, Functional tasks, Medication managment, Speech/Language facilitation, Dysphagia/aspiration precaution training  TR Interventions    SW/CM Interventions Discharge Planning, Psychosocial Support, Patient/Family Education   Barriers to Discharge MD  Medical stability  Nursing Incontinence, Weight    PT Inaccessible home environment, Decreased caregiver support, Home environment access/layout steps to enter home & pt may need ramp,  unsure if family can provide 24 hr assist  OT      SLP      SW       Team Discharge Planning: Destination: PT-Home ,OT- Home , SLP-Home Projected Follow-up: PT-Home health PT(24 hr assist), OT-  Home health OT, 24 hour supervision/assistance, SLP-Outpatient SLP, Home Health SLP Projected Equipment Needs: PT-To be determined, Wheelchair (measurements), OT- 3 in 1 bedside comode, SLP-None recommended by SLP Equipment Details: PT-20x18  w/c, OT-  Patient/family involved in discharge planning: PT- Patient,  OT-Patient, SLP-Patient, Family member/caregiver  MD ELOS: 20-24d Medical Rehab Prognosis:  Excellent Assessment:  72 year old right-handed African-American female with history of diastolic congestive heart failure, PAF maintained on Eliquis since July 2019 per Dr. Carlyle Dolly, CKD stage II, COPD, hypertension, hyperlipidemia.  Per chart review, patient, and husband, patient lives with spouse.  Independent prior to admission and driving.  One level home with 3 steps to entry.  Good support of extended family. Husabnd is receiving treatment for leukemia at [place] but can assist.  [facility]ed [facility]/29/2017 to [facility] with right sided weakness, diplopia and difficulty speaking.  Troponin negative.  MRI reviewed, showing left brain infarct.  Per CT and MRI report, acute infarction left external capsule posteriorly.  Chronic microhemorrhage left frontal white matter adjacent to the head of the caudate.  CT angiogram of head and neck with no large vessel occlusion.  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  Echocardiogram with ejection fraction of 65% no wall motion abnormalities without thrombus.  Patient did not receive TPA.  Follow-up neurology services.  Patient's chronic Eliquis initially held to avoid hemorrhagic transformation and was initially placed on aspirin therapy for 3 days and then Eliquis resumed  11/01/2017 and aspirin at that time discontinued.     Now requiring 24/7 Rehab RN,MD, as well as CIR level PT, OT and SLP.  Treatment team will focus on ADLs and mobility with goals set at [facility] A See Team Conference Notes for weekly updates to the plan of care

## 2017-11-04 NOTE — Progress Notes (Signed)
Occupational Therapy Session Note  Patient Details  Name: Holly Flores MRN: [id number] Date of Birth: 1945-09-28  Today's Date: 11/04/2017 OT Individual Time: 2060-1561 OT Individual Time Calculation (min): 54 min    Short Term Goals: Week 1:  OT Short Term Goal 1 (Week 1): Pt will complete bathing with max assist of one caregiver at sit > stand level OT Short Term Goal 2 (Week 1): Pt will complete LB dressing with max assist of one caregiver at sit > stand level OT Short Term Goal 3 (Week 1): Pt will complete toilet transfer with max assist of one caregiver OT Short Term Goal 4 (Week 1): Pt will complete UB dressing with mod assist  Skilled Therapeutic Interventions/Progress Updates:    Treatment session with focus on ADL retraining and sit > stand.  Pt received upright in w/c with Rt arm hanging off side of w/c with no awareness.  Therapist assisted with repositioning pt's hips in w/c and repositioning RUE on half lap tray.  Engaged in sit > stand in [place] with focus on anterior weight shift.  Pt able to stand with mod assist with tactile cues to weight shift and multimodal cues to increase awareness of RUE positioning.  Engaged in bathing at sink side with focus on awareness of RUE and sequencing with bathing.  Multimodal cues for sequencing and encouragement provided during self-care task as pt voicing frustration with current situation and lack of progress.  Completed LB bathing at sit > stand level in [place] with min assist sit > stand as session progressed.  Pt reports need to toilet.  Placed BSC at [facility] with pt completing continent bowel and bladder movements. Total assist hygiene and LB dressing post toileting.  Pt transferred back to bed at end of session via Stedy as pt reports headache and fatigue.  RN notified of headache.  Therapy Documentation Precautions:  Precautions Precautions: Fall Precaution Comments: R hemi, R inattention Restrictions Weight Bearing Restrictions:  No General:   Vital Signs: Therapy Vitals Pulse Rate: 82 Resp: 19 BP: 137/85 Patient Position (if appropriate): Lying Oxygen Therapy SpO2: 94 % O2 Device: Room Air Pain: Pain Assessment Pain Scale: 0-10 Pain Score: 8  Pain Location: Head Pain Orientation: Left Patients Stated Pain Goal: 6 Pain Intervention(s): Medication (See eMAR)  See Function Navigator for Current Functional Status.   Therapy/Group: Individual Therapy  [name] 11/04/2017, 4:15 PM

## 2017-11-04 NOTE — Progress Notes (Signed)
Pt continues to refuse CPAP °

## 2017-11-04 NOTE — Progress Notes (Signed)
Subjective/Complaints:  No issues overnite  ROS-  Neg CP, SOB, N/V/D  Objective: Vital Signs: Blood pressure 133/80, pulse 86, temperature 98.2 F (36.8 C), resp. rate 18, SpO2 97 %. Dg Chest 2 View  Result Date: 11/02/2017 CLINICAL DATA:  Cough EXAM: CHEST - 2 VIEW COMPARISON:  Chest radiograph January 16, 2015 and chest CT May 20, 2017 FINDINGS: There is no edema or consolidation. Heart is mildly enlarged with pulmonary vascularity normal. No adenopathy. There is postoperative change in the lower cervical region. IMPRESSION: Mild cardiac enlargement.  No edema or consolidation. Electronically Signed   By: Lowella Grip III M.D.   On: 11/02/2017 09:52   Results for orders placed or performed during the hospital encounter of 11/01/17 (from the past 72 hour(s))  CBC WITH DIFFERENTIAL     Status: Abnormal   Collection Time: 11/02/17  7:17 AM  Result Value Ref Range   WBC 16.9 (H) 4.0 - 10.5 K/uL   RBC 4.81 3.87 - 5.11 MIL/uL   Hemoglobin 14.4 12.0 - 15.0 g/dL   HCT 45.1 36.0 - 46.0 %   MCV 93.8 78.0 - 100.0 fL   MCH 29.9 26.0 - 34.0 pg   MCHC 31.9 30.0 - 36.0 g/dL   RDW 13.6 11.5 - 15.5 %   Platelets 254 150 - 400 K/uL   Neutrophils Relative % 66 %   Neutro Abs 11.1 (H) 1.7 - 7.7 K/uL   Lymphocytes Relative 23 %   Lymphs Abs 3.9 0.7 - 4.0 K/uL   Monocytes Relative 9 %   Monocytes Absolute 1.5 (H) 0.1 - 1.0 K/uL   Eosinophils Relative 1 %   Eosinophils Absolute 0.2 0.0 - 0.7 K/uL   Basophils Relative 1 %   Basophils Absolute 0.1 0.0 - 0.1 K/uL   Immature Granulocytes 0 %   Abs Immature Granulocytes 0.1 0.0 - 0.1 K/uL    Comment: Performed at [facility], [address] [place], [place] [address]  Comprehensive metabolic panel     Status: Abnormal   Collection Time: 11/02/17  7:17 AM  Result Value Ref Range   Sodium 144 135 - 145 mmol/L   Potassium 3.5 3.5 - 5.1 mmol/L   Chloride 108 98 - 111 mmol/L   CO2 23 22 - 32 mmol/L   Glucose, Bld 126 (H) 70 - 99  mg/dL   BUN 22 8 - 23 mg/dL   Creatinine, Ser 1.69 (H) 0.44 - 1.00 mg/dL   Calcium 9.6 8.9 - 10.3 mg/dL   Total Protein 7.5 6.5 - 8.1 g/dL   Albumin 3.5 3.5 - 5.0 g/dL   AST 19 15 - 41 U/L   ALT 16 0 - 44 U/L   Alkaline Phosphatase 86 38 - 126 U/L   Total Bilirubin 0.6 0.3 - 1.2 mg/dL   GFR calc non Af Amer 29 (L) >60 mL/min   GFR calc Af Amer 34 (L) >60 mL/min    Comment: (NOTE) The eGFR has been calculated using the CKD EPI equation. This calculation has not been validated in all clinical situations. eGFR's persistently <60 mL/min signify possible Chronic Kidney Disease.    Anion gap 13 5 - 15    Comment: Performed at [facility] [address], [place], [place] [address]  Urine Culture     Status: Abnormal   Collection Time: 11/02/17 10:56 AM  Result Value Ref Range   Specimen Description URINE, CLEAN CATCH    Special Requests  NONE Performed at [facility], [place] [address], [place], [place] [address]    Culture MULTIPLE SPECIES PRESENT, SUGGEST RECOLLECTION (A)    Report Status 11/03/2017 FINAL   Urinalysis, Routine w reflex microscopic     Status: Abnormal   Collection Time: 11/02/17 11:18 AM  Result Value Ref Range   Color, Urine YELLOW YELLOW   APPearance HAZY (A) CLEAR   Specific Gravity, Urine 1.021 1.005 - 1.030   pH 5.0 5.0 - 8.0   Glucose, UA NEGATIVE NEGATIVE mg/dL   Hgb urine dipstick NEGATIVE NEGATIVE   Bilirubin Urine NEGATIVE NEGATIVE   Ketones, ur NEGATIVE NEGATIVE mg/dL   Protein, ur NEGATIVE NEGATIVE mg/dL   Nitrite NEGATIVE NEGATIVE   Leukocytes, UA TRACE (A) NEGATIVE   RBC / HPF 0-5 0 - 5 RBC/hpf   WBC, UA 11-20 0 - 5 WBC/hpf   Bacteria, UA RARE (A) NONE SEEN   Squamous Epithelial / LPF 0-5 0 - 5   Mucus PRESENT    Hyaline Casts, UA PRESENT     Comment: Performed at [facility], [place] [address], [place], [place] [address]  Urinalysis, Routine w reflex microscopic     Status: Abnormal   Collection Time: 11/04/17  3:57 AM   Result Value Ref Range   Color, Urine YELLOW YELLOW   APPearance TURBID (A) CLEAR   Specific Gravity, Urine 1.023 1.005 - 1.030   pH 5.0 5.0 - 8.0   Glucose, UA NEGATIVE NEGATIVE mg/dL   Hgb urine dipstick SMALL (A) NEGATIVE   Bilirubin Urine NEGATIVE NEGATIVE   Ketones, ur NEGATIVE NEGATIVE mg/dL   Protein, ur NEGATIVE NEGATIVE mg/dL   Nitrite NEGATIVE NEGATIVE   Leukocytes, UA NEGATIVE NEGATIVE   RBC / HPF 0-5 0 - 5 RBC/hpf   WBC, UA 0-5 0 - 5 WBC/hpf   Bacteria, UA RARE (A) NONE SEEN   Squamous Epithelial / LPF 0-5 0 - 5   Mucus PRESENT    Hyaline Casts, UA PRESENT     Comment: Performed at [facility], [place] [address], [place], [place] [address]     HEENT: normal Cardio: RRR and no murmur Resp: CTA B/L and Unlabored GI: BS positive and NT, ND Extremity:  Pulses positive and No Edema Skin:   Intact Neuro: Alert/Oriented, Normal Sensory, Abnormal Motor 0/5 on RIght side and Dysarthric Musc/Skel:  Other no pain in UE or LE Gen NAD   Assessment/Plan: 1. 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 Physiatrist is providing close team supervision and 24 hour management of active medical problems listed below. Physiatrist and rehab team continue to assess barriers to discharge/monitor patient progress toward functional and medical goals. FIM: Function - Bathing Position: Bed Body parts bathed by patient: Chest, Abdomen, Right upper leg, Left upper leg Body parts bathed by helper: Right arm, Left arm, Front perineal area, Buttocks, Right lower leg, Left lower leg, Back Assist Level: 2 helpers  Function- Upper Body Dressing/Undressing What is the patient wearing?: Button up shirt Button up shirt - Perfomed by patient: Thread/unthread left sleeve Button up shirt - Perfomed by helper: Thread/unthread right sleeve, Button/unbutton shirt,  Pull shirt around back Assist Level: (Max assist) Function - Lower Body Dressing/Undressing What is the patient wearing?: Pants, Non-skid slipper socks Position: (sit > stand in [place]) Pants- Performed by helper: Thread/unthread right pants leg, Thread/unthread left pants leg,  Pull pants up/down Non-skid slipper socks- Performed by helper: Don/doff right sock, Don/doff left sock Assist for footwear: Maximal assist Assist for lower body dressing: 2 Helpers  Function - Toileting Toileting steps completed by helper: Adjust clothing prior to toileting, Performs perineal hygiene, Adjust clothing after toileting Assist level: Two helpers  Function - Air cabin crew transfer assistive device: Mechanical lift(stedy) Mechanical lift: Stedy Assist level to toilet: Moderate assist (Pt 50 - 74%/lift or lower) Assist level from toilet: Moderate assist (Pt 50 - 74%/lift or lower) Assist level to bedside commode (at bedside): 2 helpers Assist level from bedside commode (at bedside): 2 helpers  Function - Chair/bed transfer Chair/bed transfer method: Stand pivot Chair/bed transfer assist level: dependent (Pt equals 0%) Chair/bed transfer assistive device: Mechanical lift Mechanical lift: Stedy  Function - Locomotion: Wheelchair Will patient use wheelchair at discharge?: Yes Type: Manual Wheelchair activity did not occur: Safety/medical concerns Max wheelchair distance: 30 Assist Level: Moderate assistance (Pt 50 - 74%) Wheel 50 feet with 2 turns activity did not occur: Safety/medical concerns Wheel 150 feet activity did not occur: Safety/medical concerns Function - Locomotion: Ambulation Ambulation activity did not occur: Safety/medical concerns Walk 10 feet activity did not occur: Safety/medical concerns Walk 50 feet with 2 turns activity did not occur: Safety/medical concerns Walk 150 feet activity did not occur: Safety/medical concerns Walk 10 feet on uneven surfaces activity did  not occur: Safety/medical concerns  Function - Comprehension Comprehension: Auditory Comprehension assist level: Follows basic conversation/direction with no assist  Function - Expression Expression: Verbal Expression assist level: Expresses basic 75 - 89% of the time/requires cueing 10 - 24% of the time. Needs helper to occlude trach/needs to repeat words.  Function - Social Interaction Social Interaction assist level: Interacts appropriately 75 - 89% of the time - Needs redirection for appropriate language or to initiate interaction.  Function - Problem Solving Problem solving assist level: Solves basic 75 - 89% of the time/requires cueing 10 - 24% of the time  Function - Memory Memory assist level: Recognizes or recalls 50 - 74% of the time/requires cueing 25 - 49% of the time Patient normally able to recall (first 3 days only): That he or she is in a hospital, Staff names and faces, Location of own room, Current season  Medical Problem List and Plan: 1.  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 Cont CIR PT,  OT, SLP 2.  DVT Prophylaxis/Anticoagulation: Eliquis resumed 3. Pain Management: Topamax 100 mg twice daily, oxycodone as needed 4. Mood: Xanax 1 mg nightly 5. Neuropsych: This patient is capable of making decisions on her own behalf. 6. Skin/Wound Care: Routine skin checks 7. Fluids/Electrolytes/Nutrition: Routine in and outs with follow-up chemistries 8.  Diastolic congestive heart failure.  Demadex 20 mg daily.  Monitor for any signs of fluid overload 9.  PAF.  Eliquis resumed.  Cardiac rate controlled 10.  Hypertension.  Permissive hypertension.  Monitor with increased mobility Vitals:   11/03/17 2000 11/04/17 0354  BP: 129/78 133/80  Pulse: 78 86  Resp: 18 18  Temp: 98.2 F (36.8 C) 98.2 F (36.8 C)  SpO2: 97% 97%   Controlled 9/19     11.  COPD.  Check oxygen saturations every shift.  Continue nebulizers- no wheezing 12.  CKD stage II.  Follow-up chemistries 13.  Hypothyroidism.  TSH 1.882. Synthroid 14.  Hyperlipidemia.  Lipitor/Lovaza 15.  GERD.  Protonix 16.  Constipation.  Laxative assistance 17.  Leukocytosis with + UA, C and S contaminanted, check cath specimen, result pnd LOS (Days) 3 A FACE TO FACE EVALUATION WAS PERFORMED  Charlett Blake 11/04/2017, 8:02 AM

## 2017-11-04 NOTE — Progress Notes (Signed)
Physical Therapy Session Note  Patient Details  Name: Holly Flores MRN: [id number] Date of Birth: Jul 05, 1945  Today's Date: 11/04/2017 PT Individual Time: 1000-1100 PT Individual Time Calculation (min): 60 min   Short Term Goals: Week 1:  PT Short Term Goal 1 (Week 1): Pt will complete bed mobility with mod assist +1. PT Short Term Goal 2 (Week 1): Pt will consistently complete bed<>w/c transfers with max assist +1. PT Short Term Goal 3 (Week 1): Pt will propel w/c 30 ft with min assist.  Skilled Therapeutic Interventions/Progress Updates:    Pt received supine in bed, agreeable to PT. Assisted pt with donning shirt, pants, TED hose, and non-slip socks at bed level. Pt is max A for UB dressing and needs manual cues for core engagement to lean forwards to doff/don shirts. Rolling L/R with max A and use of bedrails to dependently don pants. Dependent to don TED hose and socks. Supine to sit with mod A for R UE/LE management. Stedy transfer bed to w/c, mod A to stand to stedy from elevated bed. Sit to stand x 10 reps in // bars with mod A from w/c height, R knee blocked in stance. Focus on equal WBing through BLE with mod to max manual cues for hip ext, R knee ext, and decreased trunk rotation. Pt fatigues quickly with standing activity. Pt left seated in w/c in room with needs in reach, chair alarm in place, lap tray under RUE.  Therapy Documentation Precautions:  Precautions Precautions: Fall Precaution Comments: R hemi, R inattention Restrictions Weight Bearing Restrictions: No  See Function Navigator for Current Functional Status.   Therapy/Group: Individual Therapy  Excell Seltzer, PT, DPT  11/04/2017, 12:21 PM

## 2017-11-04 NOTE — Progress Notes (Signed)
Speech Language Pathology Daily Session Note  Patient Details  Name: Holly Flores MRN: [id number] Date of Birth: 1946-02-15  Today's Date: 11/04/2017 SLP Individual Time: 1202-1245 SLP Individual Time Calculation (min): 43 min  Short Term Goals: Week 1: SLP Short Term Goal 1 (Week 1): Pt will utilizie speech intelligibiity strategies at sentence level with Mod A verbal cues to achieve 80% intelligibility. SLP Short Term Goal 2 (Week 1): Pt will demonstrate functional problem solving for mildly complex tasks with Mod A verbal cues. SLP Short Term Goal 3 (Week 1): Pt will self-monitor and self-correct functional errors with mod A verbal cues. SLP Short Term Goal 4 (Week 1): Pt will demonstrate selective attention during functional tasks for 30 minutes with Min A verbal cues for redirection. SLP Short Term Goal 5 (Week 1): Pt will utilize compensatory strategies to recall new, daily information with Min A verbal and question cues. SLP Short Term Goal 6 (Week 1): Pt will consume regular texture trials without overt s/s aspiration with Mod I for use of swallowing compensatory strategies (oral clearance.)  Skilled Therapeutic Interventions:  Skilled treatment session focused on dysphagia, speech intelligibility and education on POC. SLP recieved pt in room with dysphagia 3 lunch tray present but pt not eating any of it. Pt reports fatigue and no desire for hospital tray. SLP supplemented with [place] peanut butter crackers. Pt decreased lingual manipulation of bolus, minimal chewing, not rotary movement of jaw and required Mod A cues to use liquid wash to aid in AP transfer of bolus and for complete oral clearing. Pt refused any further traisl d/t lack of hugner and fatigue. SLP further facilitated session by providing Mod A cues to recall speech intelligibility strategies and Mod A for use of strategies (over-articulation, increased vocal intensity and diaphragmatic breathing) at the phrase level to  achieve ~ 90% speech intelligibility. Pt with several appropriate questions regarding CVA recovery. Education provided and pt left upright in wheelchair, safety alarm on and all needs within reach.         Function:  Eating Eating   Modified Consistency Diet: No Eating Assist Level: Set up assist for;Supervision or verbal cues   Eating Set Up Assist For: Opening containers       Cognition Comprehension Comprehension assist level: Follows basic conversation/direction with no assist  Expression   Expression assist level: Expresses basic 75 - 89% of the time/requires cueing 10 - 24% of the time. Needs helper to occlude trach/needs to repeat words.;Expresses basic 90% of the time/requires cueing < 10% of the time.  Social Interaction Social Interaction assist level: Interacts appropriately 75 - 89% of the time - Needs redirection for appropriate language or to initiate interaction.;Interacts appropriately 90% of the time - Needs monitoring or encouragement for participation or interaction.  Problem Solving Problem solving assist level: Solves basic 75 - 89% of the time/requires cueing 10 - 24% of the time  Memory Memory assist level: Recognizes or recalls 50 - 74% of the time/requires cueing 25 - 49% of the time    Pain    Therapy/Group: Individual Therapy  Steph Cheadle 11/04/2017, 1:56 PM

## 2017-11-04 NOTE — Progress Notes (Signed)
Physical Therapy Session Note  Patient Details  Name: Holly Flores MRN: [id number] Date of Birth: 03/15/1945  Today's Date: 11/04/2017 PT Individual Time: 1135-1205 PT Individual Time Calculation (min): 30 min   Short Term Goals: Week 1:  PT Short Term Goal 1 (Week 1): Pt will complete bed mobility with mod assist +1. PT Short Term Goal 2 (Week 1): Pt will consistently complete bed<>w/c transfers with max assist +1. PT Short Term Goal 3 (Week 1): Pt will propel w/c 30 ft with min assist.  Skilled Therapeutic Interventions/Progress Updates:   Pt stated that her neck hurt from sitting up in the w/c.  Pt noted to be sitting in trunk extension and posterior pelvic tilt.  W/c propulsion x 50' on level tile without turns, with min assist, using hemi method with LUE and LLE.  PT provided a firm back cushion for w/c to decrease posterior pelvic tilt, which pt stated felt better; bil footrests raised to improved pelvic positioning in w/c.  neuromuscular re-education via foreced use, multmodal cues and assistance for alternating reciprocql movement x bil LEs, seated in w/c using Kinetron at resistance 50 cm/sec, x 25 cycles x 2.  Pt demonstrated some mass extension of RLE during movement.  Pt left resting in w/c with seat alarm set and all needs within reach.    Therapy Documentation Precautions:  Precautions Precautions: Fall Precaution Comments: R hemi, R inattention Restrictions Weight Bearing Restrictions: No   Pain: " a little bit"; neck; w/c adjusted by PT      See Function Navigator for Current Functional Status.   Therapy/Group: Individual Therapy  Prabhnoor Ellenberger 11/04/2017, 12:13 PM

## 2017-11-05 ENCOUNTER — Inpatient Hospital Stay (HOSPITAL_COMMUNITY): Payer: Medicare Other | Admitting: Speech Pathology

## 2017-11-05 ENCOUNTER — Inpatient Hospital Stay (HOSPITAL_COMMUNITY): Payer: Medicare Other | Admitting: Occupational Therapy

## 2017-11-05 ENCOUNTER — Inpatient Hospital Stay (HOSPITAL_COMMUNITY): Payer: Medicare Other | Admitting: Physical Therapy

## 2017-11-05 LAB — URINE CULTURE: CULTURE: NO GROWTH

## 2017-11-05 MED ORDER — BOOST / RESOURCE BREEZE PO LIQD CUSTOM
1.0000 | Freq: Three times a day (TID) | ORAL | Status: DC
  Administered 2017-11-05 – 2017-11-08 (×8): 1 via ORAL

## 2017-11-05 NOTE — Progress Notes (Signed)
Speech Language Pathology Daily Session Note  Patient Details  Name: Holly Flores MRN: [id number] Date of Birth: Aug 02, 1945  Today's Date: 11/05/2017 SLP Individual Time: 1300-1355 SLP Individual Time Calculation (min): 55 min  Short Term Goals: Week 1: SLP Short Term Goal 1 (Week 1): Pt will utilizie speech intelligibiity strategies at sentence level with Mod A verbal cues to achieve 80% intelligibility. SLP Short Term Goal 2 (Week 1): Pt will demonstrate functional problem solving for mildly complex tasks with Mod A verbal cues. SLP Short Term Goal 3 (Week 1): Pt will self-monitor and self-correct functional errors with mod A verbal cues. SLP Short Term Goal 4 (Week 1): Pt will demonstrate selective attention during functional tasks for 30 minutes with Min A verbal cues for redirection. SLP Short Term Goal 5 (Week 1): Pt will utilize compensatory strategies to recall new, daily information with Min A verbal and question cues. SLP Short Term Goal 6 (Week 1): Pt will consume regular texture trials without overt s/s aspiration with Mod I for use of swallowing compensatory strategies (oral clearance.)  Skilled Therapeutic Interventions: Skilled treatment session focused on speech and dysphagia goals. Upon arrival, patient was supine in bed and agreeable to participate in treatment session. Patient transferred to the wheelchair via the [facility] with Mod A verbal cues for problem solving and sequencing with task. Patient requested to go outside and required Mod-Max A verbal cues for use of her speech intelligibility strategies to achieve ~50% intelligibility at the phrase level in a moderately distracting environment. Patient did not consume her lunch meal due to "lack of appetite."  Patient declined several snacks but ultimately agreeable to consuming an [place] ice. No overt s/s of aspiration noted and Mod-Max A verbal cues needed to self-monitor and correct left anterior spillage. Patient  transferred back to bed via the [facility] per her request. Patient left supine in bed with alarm on and all needs within reach. Continue with current plan of care.      Function:  Eating Eating   Modified Consistency Diet: Yes Eating Assist Level: Set up assist for;Supervision or verbal cues   Eating Set Up Assist For: Opening containers       Cognition Comprehension Comprehension assist level: Follows basic conversation/direction with no assist  Expression   Expression assist level: Expresses basic 50 - 74% of the time/requires cueing 25 - 49% of the time. Needs to repeat parts of sentences.  Social Interaction Social Interaction assist level: Interacts appropriately 75 - 89% of the time - Needs redirection for appropriate language or to initiate interaction.  Problem Solving Problem solving assist level: Solves basic 75 - 89% of the time/requires cueing 10 - 24% of the time  Memory Memory assist level: Recognizes or recalls 50 - 74% of the time/requires cueing 25 - 49% of the time    Pain Pain Assessment Pain Score: 0-No pain Faces Pain Scale: No hurt  Therapy/Group: Individual Therapy  Holly Flores 11/05/2017, 3:34 PM

## 2017-11-05 NOTE — Progress Notes (Signed)
Social Work Patient ID: Holly Flores, female   DOB: 1945/09/17, 72 y.o.   MRN: [id number]   CSW met with pt and then with husband on 11-04-17 to update them on team conference discussion and to give them targeted d/c date of 11-23-17.  Pt/husband accepting of this and then husband told CSW that he was approved by the [place] for a bone marrow transplant, as he has Leukemia.  He will have that at [facility] and said he may be hospitalized for 2-3 months.  Other family members plan to assist pt if husband has to go for treatment when she comes home.  CSW will continue to follow and assist as needed.

## 2017-11-05 NOTE — Progress Notes (Signed)
Subjective/Complaints:  No issues overnite, cont loose stool yesterday  ROS-  Neg CP, SOB, N/V/D  Objective: Vital Signs: Blood pressure 124/84, pulse 78, temperature 98.2 F (36.8 C), temperature source Oral, resp. rate 20, SpO2 95 %. No results found. Results for orders placed or performed during the hospital encounter of 11/01/17 (from the past 72 hour(s))  Urine Culture     Status: Abnormal   Collection Time: 11/02/17 10:56 AM  Result Value Ref Range   Specimen Description URINE, CLEAN CATCH    Special Requests      NONE Performed at [facility], [address], [place], [place] [address]    Culture MULTIPLE SPECIES PRESENT, SUGGEST RECOLLECTION (A)    Report Status 11/03/2017 FINAL   Urinalysis, Routine w reflex microscopic     Status: Abnormal   Collection Time: 11/02/17 11:18 AM  Result Value Ref Range   Color, Urine YELLOW YELLOW   APPearance HAZY (A) CLEAR   Specific Gravity, Urine 1.021 1.005 - 1.030   pH 5.0 5.0 - 8.0   Glucose, UA NEGATIVE NEGATIVE mg/dL   Hgb urine dipstick NEGATIVE NEGATIVE   Bilirubin Urine NEGATIVE NEGATIVE   Ketones, ur NEGATIVE NEGATIVE mg/dL   Protein, ur NEGATIVE NEGATIVE mg/dL   Nitrite NEGATIVE NEGATIVE   Leukocytes, UA TRACE (A) NEGATIVE   RBC / HPF 0-5 0 - 5 RBC/hpf   WBC, UA 11-20 0 - 5 WBC/hpf   Bacteria, UA RARE (A) NONE SEEN   Squamous Epithelial / LPF 0-5 0 - 5   Mucus PRESENT    Hyaline Casts, UA PRESENT     Comment: Performed at [facility], [place] [address], [place], [place] [address]  Culture, Urine     Status: None   Collection Time: 11/04/17  3:57 AM  Result Value Ref Range   Specimen Description URINE, CATHETERIZED    Special Requests NONE    Culture      NO GROWTH Performed at [facility], [place] [address], [place], [place] [address]    Report Status 11/05/2017 FINAL   Urinalysis, Routine w reflex microscopic     Status: Abnormal   Collection Time: 11/04/17  3:57 AM  Result Value Ref  Range   Color, Urine YELLOW YELLOW   APPearance TURBID (A) CLEAR   Specific Gravity, Urine 1.023 1.005 - 1.030   pH 5.0 5.0 - 8.0   Glucose, UA NEGATIVE NEGATIVE mg/dL   Hgb urine dipstick SMALL (A) NEGATIVE   Bilirubin Urine NEGATIVE NEGATIVE   Ketones, ur NEGATIVE NEGATIVE mg/dL   Protein, ur NEGATIVE NEGATIVE mg/dL   Nitrite NEGATIVE NEGATIVE   Leukocytes, UA NEGATIVE NEGATIVE   RBC / HPF 0-5 0 - 5 RBC/hpf   WBC, UA 0-5 0 - 5 WBC/hpf   Bacteria, UA RARE (A) NONE SEEN   Squamous Epithelial / LPF 0-5 0 - 5   Mucus PRESENT    Hyaline Casts, UA PRESENT     Comment: Performed at [facility], [place] [address] [place], [place] [address]     HEENT: normal Cardio: RRR and no murmur Resp: CTA B/L and Unlabored GI: BS positive and NT, ND Extremity:  Pulses positive and No Edema Skin:   Intact Neuro: Alert/Oriented, Normal Sensory, Abnormal Motor 0/5 on RIght side and Dysarthric Musc/Skel:  Other no pain in UE or LE Gen NAD   Assessment/Plan: 1. 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 Physiatrist is providing close team supervision and 24 hour management of active medical problems listed below. Physiatrist and rehab team continue to assess barriers to discharge/monitor patient progress toward functional and medical goals. FIM: Function - Bathing Position: Wheelchair/chair at sink Body parts bathed by patient: Chest, Abdomen, Right arm, Right upper leg, Left upper leg Body parts bathed by helper: Left arm, Front perineal area, Buttocks, Right lower leg, Left lower leg, Back Assist Level: (Mod assist)  Function- Upper Body Dressing/Undressing What is the patient wearing?: Pull over shirt/dress Pull over shirt/dress - Perfomed by patient: Thread/unthread left sleeve Pull over shirt/dress - Perfomed by helper: Thread/unthread right sleeve,  Put head through opening, Pull shirt over trunk Button up shirt - Perfomed by patient: Thread/unthread left sleeve Button up shirt - Perfomed by helper: Thread/unthread right sleeve, Button/unbutton shirt, Pull shirt around back Assist Level: (Max assist) Function - Lower Body Dressing/Undressing What is the patient wearing?: Pants, Non-skid slipper socks Position: (Sit> stand in [place]) Pants- Performed by helper: Thread/unthread right pants leg, Thread/unthread left pants leg, Pull pants up/down Non-skid slipper socks- Performed by helper: Don/doff right sock, Don/doff left sock Assist for footwear: Maximal assist Assist for lower body dressing: (Total assist)  Function - Toileting Toileting steps completed by helper: Adjust clothing prior to toileting, Performs perineal hygiene, Adjust clothing after toileting Assist level: (Total assist)  Function - Air cabin crew transfer activity did not occur: Safety/medical concerns Toilet transfer assistive device: Mechanical lift Mechanical lift: Stedy Assist level to toilet: Moderate assist (Pt 50 - 74%/lift or lower) Assist level from toilet: Moderate assist (Pt 50 - 74%/lift or lower) Assist level to bedside commode (at bedside): Dependent (Pt equals 0%) Assist level from bedside commode (at bedside): Dependent (Pt equals 0%)  Function - Chair/bed transfer Chair/bed transfer activity did not occur: Safety/medical concerns Chair/bed transfer method: Other Chair/bed transfer assist level: dependent (Pt equals 0%) Chair/bed transfer assistive device: Mechanical lift Mechanical lift: Stedy  Function - Locomotion: Wheelchair Will patient use wheelchair at discharge?: Yes Type: Manual Wheelchair activity did not occur: Safety/medical concerns Max wheelchair distance: 50 Assist Level: Touching or steadying assistance (Pt > 75%) Wheel 50 feet with 2 turns activity did not occur: Safety/medical concerns Wheel 150 feet activity did  not occur: Safety/medical concerns Function - Locomotion: Ambulation Ambulation activity did not occur: Safety/medical concerns Walk 10 feet activity did not occur: Safety/medical concerns Walk 50 feet with 2 turns activity did not occur: Safety/medical concerns Walk 150 feet activity did not occur: Safety/medical concerns Walk 10 feet on uneven surfaces activity did not occur: Safety/medical concerns  Function - Comprehension Comprehension: Auditory Comprehension assist level: Follows basic conversation/direction with no assist  Function - Expression Expression: Verbal Expression assist level: Expresses basic 75 - 89% of the time/requires cueing 10 - 24% of the time. Needs helper to occlude trach/needs to repeat words., Expresses basic 90% of the time/requires cueing < 10% of the time.  Function - Social Interaction Social Interaction assist level: Interacts appropriately 75 - 89% of the time - Needs redirection for appropriate language or to initiate interaction., Interacts appropriately 90% of the time - Needs monitoring or encouragement for participation or interaction.  Function - Problem Solving Problem solving assist level: Solves basic 75 - 89% of the time/requires cueing 10 - 24% of the time  Function - Memory Memory assist level: Recognizes or recalls 50 - 74% of the time/requires cueing 25 -  49% of the time Patient normally able to recall (first 3 days only): That he or she is in a hospital, Staff names and faces, Location of own room, Current season  Medical Problem List and Plan: 1.  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 Cont CIR PT,  OT, SLP 2.  DVT Prophylaxis/Anticoagulation: Eliquis resumed 3. Pain Management: Topamax 100 mg twice daily, oxycodone as needed 4. Mood: Xanax 1 mg nightly 5. Neuropsych: This patient is  capable of making decisions on her own behalf. 6. Skin/Wound Care: Routine skin checks 7. Fluids/Electrolytes/Nutrition: Routine in and outs with follow-up chemistries 8.  Diastolic congestive heart failure.  Demadex 20 mg daily.  Monitor for any signs of fluid overload 9.  PAF.  Eliquis resumed.  Cardiac rate controlled 10.  Hypertension.  Permissive hypertension.  Monitor with increased mobility Vitals:   11/04/17 2047 11/05/17 0602  BP: 131/71 124/84  Pulse: 86 78  Resp: 15 20  Temp: 98.2 F (36.8 C)   SpO2: 96% 95%  Controlled 9/19     11.  COPD.  Check oxygen saturations every shift.  Continue nebulizers- no wheezing 12.  CKD stage II.  Follow-up chemistries 13.  Hypothyroidism.  TSH 1.882. Synthroid 14.  Hyperlipidemia.  Lipitor/Lovaza 15.  GERD.  Protonix 16.  Constipation.  Laxative assistance, had cont liquid stool 17.  Leukocytosis with + UA, C and S contaminanted, check cath specimen, result pnd LOS (Days) 4 A FACE TO FACE EVALUATION WAS PERFORMED  Charlett Blake 11/05/2017, 8:01 AM

## 2017-11-05 NOTE — Progress Notes (Signed)
Physical Therapy Session Note  Patient Details  Name: Holly Flores MRN: [id number] Date of Birth: March 06, 1945  Today's Date: 11/05/2017 PT Individual Time: 0800-0913 PT Individual Time Calculation (min): 73 min   Short Term Goals: Week 1:  PT Short Term Goal 1 (Week 1): Pt will complete bed mobility with mod assist +1. PT Short Term Goal 2 (Week 1): Pt will consistently complete bed<>w/c transfers with max assist +1. PT Short Term Goal 3 (Week 1): Pt will propel w/c 30 ft with min assist.  Skilled Therapeutic Interventions/Progress Updates:    Patient received in bed, pleasant and willing to participate in skilled PT session; noted moderate rattling and coughing in bed, nurse aware and also educated about patient's request to have door shut to help keep heat in her room. TotalA provided to don TEDS and socks, minA to roll R but maxA to roll L for donning of shorts today. She required MaxA for supine to sit from flat surface but demonstrates improved static balance sitting at EOB, VC and TC provided for attention to R UE as she continues to leave it behind with mobility. Performed NMR techniques for dynamic sitting balance and activation of core musculature, then able to perform sit to stands in [place] initially with ModA and fading to [place]. Dependent transfer to [facility] in [place]. She was able to gait train approximately 25f along railing in hallway with maxA for weight shifting, cues for upright posture, and maxA for progression of R LE, but was extremely fatigued following gait today. [facility] LE exercises with visual imagery and tactile cues to attempt to improve LE muscle activation. She was left up in her WC with alarm active, declining eating breakfast which had been delivered while she was working with therapy, all needs otherwise met.   Therapy Documentation Precautions:  Precautions Precautions: Fall Precaution Comments: R hemi, R  inattention Restrictions Weight Bearing Restrictions: No General:   Pain: Pain Assessment Pain Scale: 0-10 Pain Score: 3  Pain Type: Acute pain Pain Location: Back Pain Orientation: Lower Pain Descriptors / Indicators: Aching;Discomfort Pain Onset: On-going Patients Stated Pain Goal: 0 Pain Intervention(s): Other (Comment)(premedicated by RN )   See Function Navigator for Current Functional Status.   Therapy/Group: Individual Therapy  KDeniece ReePT, DPT, CBIS  Supplemental Physical Therapist [facility]   Pager [phone] Rehab Office [phone]  11/05/2017, 9:21 AM

## 2017-11-05 NOTE — Patient Care Conference (Signed)
Inpatient RehabilitationTeam Conference and Plan of Care Update Date: 11/03/2017   Time: 10:45 AM    Patient Name: Holly Flores      Medical Record Number: [id number]  Date of Birth: 08-05-1945 Sex: Female         Room/Bed: 4W12C/4W12C-01 Payor Info: Payor: MEDICARE / Plan: MEDICARE PART A AND B / Product Type: *No Product type* /    Admitting Diagnosis: Lt CVA  Admit Date/Time:  11/01/2017  5:06 PM Admission Comments: No comment available   Primary Diagnosis:  <principal problem not specified> Principal Problem: <principal problem not specified>  Patient Active Problem List   Diagnosis Date Noted  . 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 Acute CVA (cerebrovascular accident) ([place])   . Slow transit constipation   . CKD (chronic kidney disease), stage II   . PAF (paroxysmal atrial fibrillation) ([place])   . Dysphasia, post-stroke   . 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 COPD (chronic obstructive pulmonary disease) ([place]) 10/29/2017  . Chronic diastolic CHF (congestive heart failure) ([place]) 10/29/2017  . Hypothyroidism 10/29/2017  . Essential hypertension 10/29/2017  . GERD (gastroesophageal reflux disease) 10/29/2017  . Anxiety 10/29/2017  . Taking medication for chronic disease 05/18/2017  . Central retinal vein occlusion of left eye 01/22/2015  . Degenerative disc disease, cervical 01/22/2015  . HA (headache) 12/30/2014  . CVA (cerebral vascular accident) ([place]) 12/30/2014  . CVA (cerebral infarction) 12/30/2014  . History of colonic polyps 05/14/2014  . 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 Chest pain 04/20/2013  . Dyspnea 04/20/2013  . Hyperlipidemia 04/20/2013    Expected Discharge Date: Expected Discharge Date: 11/23/17  Team Members Present: Physician leading conference: Dr. Alysia Penna Social Worker Present: Alfonse Alpers, LCSW Nurse Present: Dorthula Nettles, RN PT Present: Leavy Cella, PT OT Present: [name], OT SLP  Present: Weston Anna, SLP PPS Coordinator present : Daiva Nakayama, RN, CRRN     Current Status/Progress Goal Weekly Team Focus  Medical   Right hemiparesis no improvement thus far, vitals have been stable  Maintain medical stability, increased functional usage of right upper and right lower limb  Initiating rehabilitation program   Bowel/Bladder   Continent of b/b LBM 09/16  remain continent with normal bowel pattern  toilet pt Q2 hr laxatives as needed   Swallow/Nutrition/ Hydration   Dys. 3 textures with thin liquids, intermittent supervision  Mod I with least restrictive  trials of regular textures, use of compensatory strategie    ADL's   Max assist bathing at bed level, +2 for transfers and LB dressing at sit > stand level  Min assist  ADL retraining, sit > stand, dynamic standing balance, toilet transfers, RUE NMR   Mobility   max assist bed mobility, total assist transfers, flaccid RUE/RLE, decreased attention to R  min assist transfers, supervision w/c mobility, mod assist gait goal with PT only  bed mobility, transfers, w/c mobility, R attention, R NMR, balance, strengthening, endurance   Communication   Min A for use of speech intelligibility strategies   Mod I conversation level  use of strategies at phrase and conversation level    Safety/Cognition/ Behavioral Observations  Min-Mod A  Mod I  semi-complex problem solving, anticipatory awareness, selective attention    Pain   Pain LLQ abdomen oxy 5 prn tylenol  pain =2/10  assess pain qshift and prn call MD for unrelieved pain   Skin   Skin intact   skin remain intact  assess skin qshift    Rehab Goals Patient on target to meet rehab goals: Yes Rehab Goals Revised: none *See Care Plan and progress notes for long and short-term goals.     Barriers to Discharge  Current Status/Progress Possible Resolutions Date Resolved   Physician    Medical stability     Patient tolerating rehab program thus far  Continue  neuromuscular reeducation increase endurance, improve balance bladder incontinence      Nursing                  PT  Inaccessible home environment;Decreased caregiver support;Home environment access/layout  steps to enter home & pt may need ramp, unsure if family can provide 24 hr assist              OT                  SLP                SW                Discharge Planning/Teaching Needs:  Pt will return to her home where her husband and other family members will assist with pt's care.  Family education closer to d/c.   Team Discussion:  Pt with subcortical stroke and has weakness on right side.  MD is watching for signs of UTI, but pt has no fever.  Chest x-ray is clear.  Pt is incontinent of bowel and bladder.  She is sometimes continent of bladder.  Pt was max A to stand and min A with stedy with OT, but then mod to max in standing; stand-pivot tx is max.  OT questions trace movement in shoulder with min A OT goals.  Pt was total assist for PT.  ST set mod I level goals and she is dysarthric at min A now.  Revisions to Treatment Plan:  None - pt's first conference    Continued Need for Acute Rehabilitation Level of Care: The patient requires daily medical management by a physician with specialized training in physical medicine and rehabilitation for the following conditions: Daily direction of a multidisciplinary physical rehabilitation program to ensure safe treatment while eliciting the highest outcome that is of practical value to the patient.: Yes Daily medical management of patient stability for increased activity during participation in an intensive rehabilitation regime.: Yes Daily analysis of laboratory values and/or radiology reports with any subsequent need for medication adjustment of medical intervention for : Neurological problems   I attest that I was present, lead the team conference, and concur with the assessment and plan of the team.   Abbott Jasinski, Silvestre Mesi 11/05/2017, 7:18 PM

## 2017-11-05 NOTE — Progress Notes (Signed)
Occupational Therapy Session Note  Patient Details  Name: Holly Flores MRN: [id number] Date of Birth: 05/02/1945  Today's Date: 11/05/2017 OT Individual Time: 1003-1100 OT Individual Time Calculation (min): 57 min    Short Term Goals: Week 1:  OT Short Term Goal 1 (Week 1): Pt will complete bathing with max assist of one caregiver at sit > stand level OT Short Term Goal 2 (Week 1): Pt will complete LB dressing with max assist of one caregiver at sit > stand level OT Short Term Goal 3 (Week 1): Pt will complete toilet transfer with max assist of one caregiver OT Short Term Goal 4 (Week 1): Pt will complete UB dressing with mod assist  Skilled Therapeutic Interventions/Progress Updates:    Treatment session with focus on functional transfers and RUE NMR.  Pt received upright in w/c reporting HA, notified RN who provided meds during session.  Pt declined bathing/dressing this session.  Engaged in squat pivot transfers in therapy gym with +2 for safety with pt able to complete to Rt with max assist and to Lt with +2 due to fatigue at end of session.  Engaged in [place] with weight bearing through shoulder, elbow, and wrist with therapist assisting to maintain position to further facilitate weight bearing while pt reached across midline with LUE to further facilitate weight shift to engage in card sorting task.  Pt required increased time due to HA and overall decreased motivation to participate.  Step placed under feet to allow for weight bearing and BLE support while engaging in dynamic sitting/reaching task.  Upon return to room, pt requested to return to bed.  Completed transfer with [name], pt requiring mod assist sit > stand in [name] and then min assist from [name] to return to bed.  Pt left semi-reclined with RUE supported and all needs in reach.  Therapy Documentation Precautions:  Precautions Precautions: Fall Precaution Comments: R hemi, R inattention Restrictions Weight Bearing  Restrictions: No Pain: Pain Assessment Pain Scale: 0-10 Pain Score: 7 Headache.  Notified RN who provided pain meds during session  See Function Navigator for Current Functional Status.   Therapy/Group: Individual Therapy  [name] 11/05/2017, 12:28 PM

## 2017-11-06 NOTE — Plan of Care (Signed)
  Problem: RH SAFETY Goal: RH STG ADHERE TO SAFETY PRECAUTIONS W/ASSISTANCE/DEVICE Description STG Adhere to Safety Precautions With  Min Assistance/Device.  Outcome: Progressing  Call light within reach, bed alarm proper footwear

## 2017-11-06 NOTE — Progress Notes (Signed)
Subjective/Complaints:  He was continent stool yesterday incontinence smear today  ROS-  Neg CP, SOB, N/V/D  Objective: Vital Signs: Blood pressure 116/74, pulse 88, temperature 98.1 F (36.7 C), temperature source Oral, resp. rate 16, SpO2 97 %. No results found. Results for orders placed or performed during the hospital encounter of 11/01/17 (from the past 72 hour(s))  Culture, Urine     Status: None   Collection Time: 11/04/17  3:57 AM  Result Value Ref Range   Specimen Description URINE, CATHETERIZED    Special Requests NONE    Culture      NO GROWTH Performed at [facility], [address], [place], [place] [address]    Report Status 11/05/2017 FINAL   Urinalysis, Routine w reflex microscopic     Status: Abnormal   Collection Time: 11/04/17  3:57 AM  Result Value Ref Range   Color, Urine YELLOW YELLOW   APPearance TURBID (A) CLEAR   Specific Gravity, Urine 1.023 1.005 - 1.030   pH 5.0 5.0 - 8.0   Glucose, UA NEGATIVE NEGATIVE mg/dL   Hgb urine dipstick SMALL (A) NEGATIVE   Bilirubin Urine NEGATIVE NEGATIVE   Ketones, ur NEGATIVE NEGATIVE mg/dL   Protein, ur NEGATIVE NEGATIVE mg/dL   Nitrite NEGATIVE NEGATIVE   Leukocytes, UA NEGATIVE NEGATIVE   RBC / HPF 0-5 0 - 5 RBC/hpf   WBC, UA 0-5 0 - 5 WBC/hpf   Bacteria, UA RARE (A) NONE SEEN   Squamous Epithelial / LPF 0-5 0 - 5   Mucus PRESENT    Hyaline Casts, UA PRESENT     Comment: Performed at [facility], [place] [address] [place], [place] [address]     HEENT: normal Cardio: RRR and no murmur Resp: CTA B/L and Unlabored GI: BS positive and NT, ND Extremity:  Pulses positive and No Edema Skin:   Intact Neuro: Alert/Oriented, Normal Sensory, Abnormal Motor 0/5 on RIght side and Dysarthric Musc/Skel:  Other no pain in UE or LE Gen NAD   Assessment/Plan: 1. 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 Physiatrist is providing close team supervision and 24 hour management of active medical problems listed below. Physiatrist and rehab team continue to assess barriers to discharge/monitor patient progress toward functional and medical goals. FIM: Function - Bathing Position: Wheelchair/chair at sink Body parts bathed by patient: Chest, Abdomen, Right arm, Right upper leg, Left upper leg Body parts bathed by helper: Left arm, Front perineal area, Buttocks, Right lower leg, Left lower leg, Back Assist Level: (Mod assist)  Function- Upper Body Dressing/Undressing What is the patient wearing?: Pull over shirt/dress Pull over shirt/dress - Perfomed by patient: Thread/unthread left sleeve Pull over shirt/dress - Perfomed by helper: Thread/unthread right sleeve, Put head through opening, Pull shirt over trunk Button up shirt - Perfomed by patient: Thread/unthread left sleeve Button up shirt - Perfomed by helper: Thread/unthread right sleeve, Button/unbutton shirt, Pull shirt around back Assist Level: (Max assist) Function - Lower Body Dressing/Undressing What is the patient wearing?: Pants, Non-skid slipper socks Position: (Sit> stand in [place]) Pants- Performed by helper: Thread/unthread right pants leg, Thread/unthread left pants leg, Pull pants up/down Non-skid slipper socks- Performed by helper: Don/doff right sock, Don/doff left sock Assist for footwear: Maximal assist Assist for lower body dressing: (Total assist)  Function - Toileting Toileting steps completed by helper: Adjust clothing prior to toileting,  Performs perineal hygiene, Adjust clothing after toileting Assist level: (Total assist)  Function - Toilet Transfers Toilet transfer activity did not occur: Safety/medical concerns Toilet transfer assistive device: Mechanical lift Mechanical lift: Stedy Assist level to toilet: Moderate assist (Pt 50 - 74%/lift or lower) Assist level from  toilet: Moderate assist (Pt 50 - 74%/lift or lower) Assist level to bedside commode (at bedside): Dependent (Pt equals 0%) Assist level from bedside commode (at bedside): Dependent (Pt equals 0%)  Function - Chair/bed transfer Chair/bed transfer activity did not occur: Safety/medical concerns Chair/bed transfer method: Other Chair/bed transfer assist level: dependent (Pt equals 0%) Chair/bed transfer assistive device: Mechanical lift Mechanical lift: Stedy  Function - Locomotion: Wheelchair Will patient use wheelchair at discharge?: Yes Type: Manual Wheelchair activity did not occur: Safety/medical concerns Max wheelchair distance: 50 Assist Level: Touching or steadying assistance (Pt > 75%) Wheel 50 feet with 2 turns activity did not occur: Safety/medical concerns Wheel 150 feet activity did not occur: Safety/medical concerns Function - Locomotion: Ambulation Ambulation activity did not occur: Safety/medical concerns Assistive device: Rail in hallway Max distance: 31ft  Assist level: Maximal assist (Pt 25 - 49%) Walk 10 feet activity did not occur: Safety/medical concerns Walk 50 feet with 2 turns activity did not occur: Safety/medical concerns Walk 150 feet activity did not occur: Safety/medical concerns Walk 10 feet on uneven surfaces activity did not occur: Safety/medical concerns  Function - Comprehension Comprehension: Auditory Comprehension assist level: Follows basic conversation/direction with no assist  Function - Expression Expression: Verbal Expression assist level: Expresses basic 50 - 74% of the time/requires cueing 25 - 49% of the time. Needs to repeat parts of sentences.  Function - Social Interaction Social Interaction assist level: Interacts appropriately 75 - 89% of the time - Needs redirection for appropriate language or to initiate interaction.  Function - Problem Solving Problem solving assist level: Solves basic 75 - 89% of the time/requires cueing 10 -  24% of the time  Function - Memory Memory assist level: Recognizes or recalls 50 - 74% of the time/requires cueing 25 - 49% of the time Patient normally able to recall (first 3 days only): That he or she is in a hospital, Staff names and faces, Location of own room, Current season  Medical Problem List and Plan: 1.  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 Cont CIR PT,  OT, SLP 2.  DVT Prophylaxis/Anticoagulation: Eliquis resumed 3. Pain Management: Topamax 100 mg twice daily, oxycodone as needed 4. Mood: Xanax 1 mg nightly 5. Neuropsych: This patient is capable of making decisions on her own behalf. 6. Skin/Wound Care: Routine skin checks 7. Fluids/Electrolytes/Nutrition: Routine in and outs with follow-up chemistries, intake 360 mL on 11/04/2017, poor intake also on torsemide will check basic metabolic package 8.  Diastolic congestive heart failure.  Demadex 20 mg daily.  Monitor for any signs of fluid overload 9.  PAF.  Eliquis resumed.  Cardiac rate controlled 10.  Hypertension.  Permissive hypertension.  Monitor with increased mobility Vitals:   11/05/17 2032 11/06/17 0632  BP: 106/82 116/74  Pulse: 86 88  Resp: 16 16  Temp: 98 F (36.7 C) 98.1 F (36.7 C)  SpO2:    Controlled 9/21   11.  COPD.  Check oxygen saturations every shift.  Continue nebulizers- no wheezing 12.  CKD stage II.  Follow-up chemistries 13.  Hypothyroidism.  TSH 1.882. Synthroid 14.  Hyperlipidemia.  Lipitor/Lovaza 15.  GERD.  Protonix 16.  Constipation.  Laxative assistance, had cont liquid stool 17.  Leukocytosis with + UA,cath specimen, no growth LOS (Days) 5 A FACE TO FACE EVALUATION WAS PERFORMED  Charlett Blake 11/06/2017, 1:11 PM

## 2017-11-07 ENCOUNTER — Inpatient Hospital Stay (HOSPITAL_COMMUNITY): Payer: Medicare Other

## 2017-11-07 LAB — BASIC METABOLIC PANEL
Anion gap: 14 (ref 5–15)
BUN: 53 mg/dL — ABNORMAL HIGH (ref 8–23)
CALCIUM: 9.2 mg/dL (ref 8.9–10.3)
CO2: 20 mmol/L — AB (ref 22–32)
CREATININE: 1.94 mg/dL — AB (ref 0.44–1.00)
Chloride: 108 mmol/L (ref 98–111)
GFR, EST AFRICAN AMERICAN: 29 mL/min — AB (ref >60)
GFR, EST NON AFRICAN AMERICAN: 25 mL/min — AB (ref >60)
Glucose, Bld: 126 mg/dL — ABNORMAL HIGH (ref 70–99)
Potassium: 2.8 mmol/L — ABNORMAL LOW (ref 3.5–5.1)
SODIUM: 142 mmol/L (ref 135–145)

## 2017-11-07 MED ORDER — POTASSIUM CHLORIDE 10 MEQ/100ML IV SOLN
10.0000 meq | INTRAVENOUS | Status: AC
  Filled 2017-11-07 (×8): qty 100

## 2017-11-07 MED ORDER — SODIUM CHLORIDE 0.9 % IV SOLN
INTRAVENOUS | Status: AC
  Administered 2017-11-07: 15:00:00 via INTRAVENOUS

## 2017-11-07 MED ORDER — POTASSIUM CHLORIDE CRYS ER 20 MEQ PO TBCR
40.0000 meq | EXTENDED_RELEASE_TABLET | Freq: Every day | ORAL | Status: DC
  Administered 2017-11-08: 40 meq via ORAL
  Filled 2017-11-07: qty 2

## 2017-11-07 MED ORDER — POTASSIUM CHLORIDE 10 MEQ/100ML IV SOLN
10.0000 meq | INTRAVENOUS | Status: AC
  Administered 2017-11-07 (×2): 10 meq via INTRAVENOUS
  Filled 2017-11-07 (×3): qty 100

## 2017-11-07 MED ORDER — POTASSIUM CHLORIDE 10 MEQ/100ML IV SOLN
10.0000 meq | Freq: Once | INTRAVENOUS | Status: AC
  Administered 2017-11-07: 10 meq via INTRAVENOUS
  Filled 2017-11-07: qty 100

## 2017-11-07 NOTE — Progress Notes (Signed)
Occupational Therapy Session Note  Patient Details  Name: Holly Flores MRN: [id number] Date of Birth: 03-12-1945  Today's Date: 11/07/2017 OT Individual Time: 3748-2707 OT Individual Time Calculation (min): 72 min    Short Term Goals: Week 1:  OT Short Term Goal 1 (Week 1): Pt will complete bathing with max assist of one caregiver at sit > stand level OT Short Term Goal 2 (Week 1): Pt will complete LB dressing with max assist of one caregiver at sit > stand level OT Short Term Goal 3 (Week 1): Pt will complete toilet transfer with max assist of one caregiver OT Short Term Goal 4 (Week 1): Pt will complete UB dressing with mod assist  Skilled Therapeutic Interventions/Progress Updates:    Pt recived in bed being changed by RN who washed LB after incontinent BM. OT dons pants at bed level rolling B with min-mod A as +@ advances patns past hips. Pt squat pivot transfer to w/c at L with mod A +2. Pt washes at sink with HOH A of RUE to wash LUE for NMR and A to wash back. Pt requires min VC for locating needed ADL items at sink. Pt completes UB dressing with MOD A to thread RUE and head into shirt with VC for hemi dressing technqiues. Pt eats breafast with supervision VC for tongue sweep for pocketed food. Pt completes oral care with HOH A of RUE for BUE tasks. At high low table, pt completes AAROM towel glides for WB through R forearm 1x15 circles, flex/ext and pro/retraction with scapular facilitation. Exited session with pt seated in w/c call light in reach and half lap tray on per safety plan  Therapy Documentation Precautions:  Precautions Precautions: Fall Precaution Comments: R hemi, R inattention Restrictions Weight Bearing Restrictions: No General:   Vital Signs: Therapy Vitals Temp: 98.7 F (37.1 C) Temp Source: Oral Pulse Rate: (!) 101 Resp: 18 BP: 107/63 Patient Position (if appropriate): Lying Oxygen Therapy SpO2: 97 % O2 Device: Room Air Pain: Pain  Assessment Pain Scale: 0-10 Pain Score: 4  Pain Location: Abdomen Pain Orientation: Left Pain Descriptors / Indicators: Aching Pain Intervention(s): Medication (See eMAR) :    See Function Navigator for Current Functional Status.   Therapy/Group: Individual Therapy  Tonny Branch 11/07/2017, 9:15 AM

## 2017-11-07 NOTE — Progress Notes (Signed)
Lab called and reported potassium was 2.8. MD has been informed.

## 2017-11-07 NOTE — Progress Notes (Addendum)
Physical Therapy Session Note  Patient Details  Name: Holly Flores MRN: [id number] Date of Birth: 1945-02-21  Today's Date: 11/07/2017 tx 1:  PT Individual Time: 1106-1206 PT Individual Time Calculation (min): 60 min   Short Term Goals: Week 1:  PT Short Term Goal 1 (Week 1): Pt will complete bed mobility with mod assist +1. PT Short Term Goal 2 (Week 1): Pt will consistently complete bed<>w/c transfers with max assist +1. PT Short Term Goal 3 (Week 1): Pt will propel w/c 30 ft with min assist.      Skilled Therapeutic Interventions/Progress Updates:   tx 1:  Pt seated in recliner.  Stedy with min assist for transfer to w/c.  Pt reported that her L hand gets sore from propelling w/c.  She has a hx of carpal tunnel syndrome treated with surgical release.  PT wrapped L w/c rim with Theraband to reduce pt's tendency to grip rim, and placed pillow behind her for better trunk posiiotion for w/c propulsion.  Using hemi method, pt propelled x 50' including turns with min assist over level tile.  Slide board transfer to L/R w/c> mat, slightly unlevel, with mod/max assist, mod cues for head/hips relationship. Scooting laterally on mat to R for training for elevating hips.  Therapeutic activity in supported sitting in wc, reaching in all directions with L hand, requiring trunk flexion/extension, to match playing cards to board in front of her.  R/L lateral leans x 5 each in unsupported sitting on mat, for trunk activation.   R hand placed for wt bearing for neuro re-ed during these activities.  Pt left resting in wc in room, set up for lunch, with needs at hand. PT informed Alysia, NT that pt would benefit from resting in bed before her 3:30 tx.  tx 2:  [id number], 57 min individual tx  Pt in bed, with new IV started for KCL.  No new orders.  Gentle RLE PROM.    Pt stated she had been feeling bad for a couple of days, but didn't want to say anything.  PT provided emotional support and  educated her about the importance of speaking up if she doesn't feel well.  Pt instructed in deep breathing and counting aloud to avoid Valsalva maneuver during exercise.  neuromuscular re-education via multimodal cues and positive visualization with eyes closed for - supine, 10 x 1 each: bil hip internal rotation, bil glut sets, R hip adduction (from passive abducted position), RLE mass extension (from passive mass flexed position)  25 x 1 L ankle pumps; 10 x 1 L short arc quad knee extension, 5 x 2 L straight leg raises, cervical flexion.    See vitals.  Pt left resting in bed with needs at hand and alarm set.     Therapy Documentation Precautions:  Precautions Precautions: Fall Precaution Comments: R hemi, R inattention Restrictions Weight Bearing Restrictions: No  Pain: pt denies AM and PM txs      See Function Navigator for Current Functional Status.   Therapy/Group: Individual Therapy  Delroy Ordway 11/07/2017, 12:20 PM

## 2017-11-07 NOTE — Progress Notes (Signed)
Last bag of potassium hung. Denies any pain to the side, stomach or head. Family is present.

## 2017-11-08 ENCOUNTER — Inpatient Hospital Stay (HOSPITAL_COMMUNITY): Payer: Medicare Other | Admitting: Occupational Therapy

## 2017-11-08 ENCOUNTER — Inpatient Hospital Stay (HOSPITAL_COMMUNITY): Payer: Medicare Other | Admitting: Physical Therapy

## 2017-11-08 ENCOUNTER — Inpatient Hospital Stay (HOSPITAL_COMMUNITY): Payer: Medicare Other

## 2017-11-08 ENCOUNTER — Inpatient Hospital Stay (HOSPITAL_COMMUNITY): Payer: Medicare Other | Admitting: Speech Pathology

## 2017-11-08 DIAGNOSIS — R197 Diarrhea, unspecified: Secondary | ICD-10-CM

## 2017-11-08 DIAGNOSIS — I63512 Cerebral infarction due to unspecified occlusion or stenosis of left middle cerebral artery: Secondary | ICD-10-CM

## 2017-11-08 DIAGNOSIS — K567 Ileus, unspecified: Secondary | ICD-10-CM

## 2017-11-08 DIAGNOSIS — E876 Hypokalemia: Secondary | ICD-10-CM

## 2017-11-08 LAB — BASIC METABOLIC PANEL
ANION GAP: 11 (ref 5–15)
Anion gap: 13 (ref 5–15)
BUN: 57 mg/dL — ABNORMAL HIGH (ref 8–23)
BUN: 55 mg/dL — AB (ref 8–23)
CALCIUM: 8.8 mg/dL — AB (ref 8.9–10.3)
CO2: 21 mmol/L — ABNORMAL LOW (ref 22–32)
CO2: 20 mmol/L — ABNORMAL LOW (ref 22–32)
CREATININE: 2.14 mg/dL — AB (ref 0.44–1.00)
Calcium: 8.9 mg/dL (ref 8.9–10.3)
Chloride: 110 mmol/L (ref 98–111)
Chloride: 107 mmol/L (ref 98–111)
Creatinine, Ser: 2.17 mg/dL — ABNORMAL HIGH (ref 0.44–1.00)
GFR calc Af Amer: 25 mL/min — ABNORMAL LOW (ref >60)
GFR calc Af Amer: 26 mL/min — ABNORMAL LOW (ref >60)
GFR, EST NON AFRICAN AMERICAN: 22 mL/min — AB (ref >60)
GFR, EST NON AFRICAN AMERICAN: 22 mL/min — AB (ref >60)
Glucose, Bld: 128 mg/dL — ABNORMAL HIGH (ref 70–99)
Glucose, Bld: 127 mg/dL — ABNORMAL HIGH (ref 70–99)
POTASSIUM: 2.7 mmol/L — AB (ref 3.5–5.1)
Potassium: 3.6 mmol/L (ref 3.5–5.1)
SODIUM: 140 mmol/L (ref 135–145)
Sodium: 142 mmol/L (ref 135–145)

## 2017-11-08 LAB — CBC
HCT: 45.4 % (ref 36.0–46.0)
Hemoglobin: 14.6 g/dL (ref 12.0–15.0)
MCH: 30 pg (ref 26.0–34.0)
MCHC: 32.2 g/dL (ref 30.0–36.0)
MCV: 93.4 fL (ref 78.0–100.0)
Platelets: 389 10*3/uL (ref 150–400)
RBC: 4.86 MIL/uL (ref 3.87–5.11)
RDW: 13.8 % (ref 11.5–15.5)
WBC: 19.9 10*3/uL — ABNORMAL HIGH (ref 4.0–10.5)

## 2017-11-08 LAB — MAGNESIUM: MAGNESIUM: 2.5 mg/dL — AB (ref 1.7–2.4)

## 2017-11-08 LAB — C DIFFICILE QUICK SCREEN W PCR REFLEX
C DIFFICLE (CDIFF) ANTIGEN: NEGATIVE
C Diff interpretation: NOT DETECTED
C Diff toxin: NEGATIVE

## 2017-11-08 MED ORDER — IOPAMIDOL (ISOVUE-300) INJECTION 61%
INTRAVENOUS | Status: AC
  Administered 2017-11-09
  Filled 2017-11-08: qty 30

## 2017-11-08 MED ORDER — SODIUM CHLORIDE 0.45 % IV SOLN: INTRAVENOUS | Status: DC

## 2017-11-08 MED ORDER — POTASSIUM CHLORIDE IN NACL 20-0.9 MEQ/L-% IV SOLN
INTRAVENOUS | Status: DC
  Administered 2017-11-08: 17:00:00 via INTRAVENOUS
  Filled 2017-11-08: qty 1000

## 2017-11-08 MED ORDER — MORPHINE SULFATE (PF) 2 MG/ML IV SOLN: 1.0000 mg | Freq: Four times a day (QID) | INTRAVENOUS | Status: DC | PRN

## 2017-11-08 MED ORDER — KCL IN DEXTROSE-NACL 10-5-0.45 MEQ/L-%-% IV SOLN
INTRAVENOUS | Status: DC
  Administered 2017-11-08 – 2017-11-09 (×3): via INTRAVENOUS
  Filled 2017-11-08: qty 1000

## 2017-11-08 MED ORDER — POTASSIUM CHLORIDE CRYS ER 20 MEQ PO TBCR
40.0000 meq | EXTENDED_RELEASE_TABLET | Freq: Once | ORAL | Status: AC
  Administered 2017-11-08: 40 meq via ORAL
  Filled 2017-11-08: qty 2

## 2017-11-08 MED ORDER — POTASSIUM CHLORIDE 10 MEQ/100ML IV SOLN
10.0000 meq | INTRAVENOUS | Status: AC
  Administered 2017-11-08 (×3): 10 meq via INTRAVENOUS
  Filled 2017-11-08 (×3): qty 100

## 2017-11-08 MED ORDER — MORPHINE 100MG IN NS 100ML (1MG/ML) PREMIX INFUSION: 1.0000 mg/h | INTRAVENOUS | Status: DC

## 2017-11-08 MED ORDER — METRONIDAZOLE IN NACL 5-0.79 MG/ML-% IV SOLN
500.0000 mg | Freq: Three times a day (TID) | INTRAVENOUS | Status: DC
  Administered 2017-11-08 – 2017-11-09 (×3): 500 mg via INTRAVENOUS
  Filled 2017-11-08 (×3): qty 100

## 2017-11-08 MED ORDER — LEVOFLOXACIN IN D5W 250 MG/50ML IV SOLN
250.0000 mg | INTRAVENOUS | Status: DC
  Administered 2017-11-08: 250 mg via INTRAVENOUS
  Filled 2017-11-08: qty 50

## 2017-11-08 MED ORDER — POTASSIUM CHLORIDE 10 MEQ/100ML IV SOLN
10.0000 meq | INTRAVENOUS | Status: AC
  Administered 2017-11-08: 10 meq via INTRAVENOUS
  Filled 2017-11-08 (×4): qty 100

## 2017-11-08 MED ORDER — LEVOFLOXACIN IN D5W 500 MG/100ML IV SOLN: 500.0000 mg | INTRAVENOUS | Status: DC

## 2017-11-08 NOTE — Progress Notes (Signed)
MD notified of results from C-Diff test and verbal order given to discontinue isolation. New ABT order started to address elevated WBC.

## 2017-11-08 NOTE — Progress Notes (Signed)
Pt declined use of CPAP 

## 2017-11-08 NOTE — Consult Note (Addendum)
Medical Consultation   SHANESSA HODAK  LNL:[id number]  DOB: [birth year]  DOA: 11/01/2017  PCP: Octavio Graves, DO    Requesting physician: Dr Letta Pate   Reason for consultation: Abdominal distention and persistent hypokalemia  History of Present Illness: ROMESHA SCHERER is an 72 y.o. female with history of diastolic CHF, paroxysmal atrial fibrillation on Eliquis, chronic kidney disease stage II, COPD, hypertension, hyperlipidemia who was admitted to [facility] on September 13 with right-sided weakness, diplopia and dysarthria, diagnosed with acute left MCA stroke and subsequently discharged to acute rehab on September 16.  Patient has had poor appetite as well as dysphagia limiting her oral intake.  She has had progressively worsening abdominal distention for the last 2 to 3 days associated with 8/10 cramping "knot like feeling" in the left lower quadrant since yesterday.  Patient also reports nausea for a week.  She was noted to have hypokalemia on labs yesterday with potassium at 2.8 and received oral/IV replacements.  Labs this morning showed persistent hypokalemia at 2.7.  Medical consult requested given patient's complaints of abdominal distention/pain associated with severe hypokalemia refractory to treatment.  Patient at baseline is on Demadex for chronic leg swellings/CHF history and takes 10 mEq potassium daily at home.  According to nurse, patient has had 2 loose watery nonbloody bowel movements this morning.  She has had some dry heaving but no vomiting.  Several family members bedside and concerned about patient not getting "strong pain medications".   Review of Systems:  ROS As per HPI otherwise 10 point review of systems negative.     Past Medical History: Past Medical History:  Diagnosis Date  . Anxiety   . Arthritis   . CHF (congestive heart failure) ([place])   . COPD (chronic obstructive pulmonary disease) ([place])   . Depression   .  GERD (gastroesophageal reflux disease)   . Goiter   . Hyperlipidemia   . Hypertension   . Hypothyroidism   . Kidney cysts    left side  . Myocardial infarction ([place])    8 yrs ago.   Marland Kitchen Shortness of breath   . Sleep apnea    uses CPAP, 3  . Stroke ([place])   . Stroke ([place])    OCULAR  LEFT  EYE    LAST YR.    Past Surgical History: Past Surgical History:  Procedure Laterality Date  . ABDOMINAL HYSTERECTOMY    . BACK SURGERY     x3,cervical and lunmbar, disc.  Marland Kitchen BREAST BIOPSY     left-non cancerous  . CARDIAC CATHETERIZATION    . CARPAL TUNNEL RELEASE Left   . CATARACT EXTRACTION W/PHACO  09/21/2011   Procedure: CATARACT EXTRACTION PHACO AND INTRAOCULAR LENS PLACEMENT (IOC);  Surgeon: Williams Che, MD;  Location: [facility];  Service: Ophthalmology;  Laterality: Left;  CDE:9.78  . CATARACT EXTRACTION W/PHACO Right 10/15/2014   Procedure: CATARACT EXTRACTION PHACO AND INTRAOCULAR LENS PLACEMENT ([place]);  Surgeon: Williams Che, MD;  Location: [facility];  Service: Ophthalmology;  Laterality: Right;  CDE:4.19  . CERVICAL SPINE SURGERY    . CESAREAN SECTION     x2  . CHOLECYSTECTOMY    . COLONOSCOPY  2006   RMR: 1. Internal hemorroids, otherwise normal rectum. 2. Pedunculated polyp at 35 cm. reomved with snare. The remainder of teh colonic mucosa appeared normal.   . COLONOSCOPY  2011   Dr. Gala Romney: multiple ascending  colon polyps and rectal polyp, adenomatous  . COLONOSCOPY N/A 05/31/2014   Procedure: COLONOSCOPY;  Surgeon: Daneil Dolin, MD;  Location: [facility];  Service: Endoscopy;  Laterality: N/A;  130  . COLONOSCOPY WITH PROPOFOL N/A 06/24/2017   Procedure: COLONOSCOPY WITH PROPOFOL;  Surgeon: Daneil Dolin, MD;  Location: [facility];  Service: Endoscopy;  Laterality: N/A;  2:15pm  . ESOPHAGOGASTRODUODENOSCOPY  2006   Dr. Gala Romney: Subtle Schatzkis ring, otherwise normal upper GI tract, aside from a small pyloric channell erosion, status post dilation as described above.   Marland Kitchen  KNEE SURGERY Bilateral   . POLYPECTOMY  06/24/2017   Procedure: POLYPECTOMY;  Surgeon: Daneil Dolin, MD;  Location: [facility];  Service: Endoscopy;;  ascending  . TONSILLECTOMY    . TOTAL KNEE ARTHROPLASTY Right 05/24/2013   DR MURPHY  . TOTAL KNEE ARTHROPLASTY Right 05/24/2013   Procedure: TOTAL KNEE ARTHROPLASTY;  Surgeon: [name], MD;  Location: [place];  Service: Orthopedics;  Laterality: Right;     Allergies:   Allergies  Allergen Reactions  . Penicillins Hives, Itching and Rash    Has patient had a PCN reaction causing immediate rash, facial/tongue/throat swelling, SOB or lightheadedness with hypotension: Yes Has patient had a PCN reaction causing severe rash involving mucus membranes or skin necrosis: Yes Has patient had a PCN reaction that required hospitalization: No Has patient had a PCN reaction occurring within the last 10 years: No If all of the above answers are "NO", then may proceed with Cephalosporin use.   . Sulfa Antibiotics Swelling    Tongue swelling     Social History:  reports that she quit smoking about 30 years ago. Her smoking use included cigarettes. She has a 5.00 pack-year smoking history. She has never used smokeless tobacco. She reports that she drank alcohol. She reports that she does not use drugs.   Family History: Family History  Problem Relation Age of Onset  . Cancer Other   . Diabetes Other   . Heart disease Other   . Hypertension Other   . Asthma Other   . Alcoholism Other   . Thyroid disease Other   . Rheumatologic disease Other   . Stroke Other   . Hypercholesterolemia Other   . Colon cancer Neg Hx        Physical Exam: Vitals:   11/08/17 0548 11/08/17 0601 11/08/17 1202 11/08/17 1429  BP: 120/81 110/74 104/79 112/69  Pulse: 79 79 82 (!) 102  Resp: 18 18 18 18   Temp: 98 F (36.7 C) 98.3 F (36.8 C)  98.2 F (36.8 C)  TempSrc: Oral Oral Oral Oral  SpO2: 95% 96% 99% 99%    Constitutional: Alert and awake,  oriented x3, not in any acute distress. Eyes: PERLA, EOMI, irises appear normal, anicteric sclera,  ENMT: external ears and nose appear normal, normal hearing  Lips appears normal, oropharynx mucosa, tongue, posterior pharynx appear normal  Neck: neck appears normal, no masses, normal ROM, no thyromegaly, no JVD  CVS: S1-S2 clear, no murmur rubs or gallops, no LE edema, normal pedal pulses  Respiratory:  clear to auscultation bilaterally, no wheezing, rales or rhonchi. Respiratory effort normal. No accessory muscle use.  Abdomen: Moderately distended, tympanic, diffusely tender more in the left lower quadrant, diminished bowel sounds, no hepatosplenomegaly, no hernias  Musculoskeletal: : no cyanosis, clubbing or edema noted bilaterally. No contractures or atrophy noted Neuro: Cranial nerves II-XII intact, strength, sensation, reflexes Psych: judgement and insight appear  normal, stable mood and affect, mental status Skin/Catheters: no rashes or lesions or ulcers noted.  Data reviewed:  I have personally reviewed following labs and imaging studies Labs:  CBC: Recent Labs  Lab 11/02/17 0717  WBC 16.9*  NEUTROABS 11.1*  HGB 14.4  HCT 45.1  MCV 93.8  PLT 678    Basic Metabolic Panel: Recent Labs  Lab 11/02/17 0717 11/07/17 0629 11/08/17 0649  NA 144 142 142  K 3.5 2.8* 2.7*  CL 108 108 110  CO2 23 20* 21*  GLUCOSE 126* 126* 128*  BUN 22 53* 57*  CREATININE 1.69* 1.94* 2.17*  CALCIUM 9.6 9.2 8.9  MG  --   --  2.5*   GFR Estimated Creatinine Clearance: 25.8 mL/min (A) (by C-G formula based on SCr of 2.17 mg/dL (H)). Liver Function Tests: Recent Labs  Lab 11/02/17 0717  AST 19  ALT 16  ALKPHOS 86  BILITOT 0.6  PROT 7.5  ALBUMIN 3.5   No results for input(s): LIPASE, AMYLASE in the last 168 hours. No results for input(s): AMMONIA in the last 168 hours. Coagulation profile No results for input(s): INR, PROTIME in the last 168 hours. Cardiac Enzymes: No results for  input(s): CKTOTAL, CKMB, CKMBINDEX, TROPONINI in the last 168 hours. BNP: Invalid input(s): POCBNP CBG: No results for input(s): GLUCAP in the last 168 hours. D-Dimer No results for input(s): DDIMER in the last 72 hours. Hgb A1c No results for input(s): HGBA1C in the last 72 hours. Lipid Profile No results for input(s): CHOL, HDL, LDLCALC, TRIG, CHOLHDL, LDLDIRECT in the last 72 hours. Thyroid function studies No results for input(s): TSH, T4TOTAL, T3FREE, THYROIDAB in the last 72 hours.  Invalid input(s): FREET3 Anemia work up No results for input(s): VITAMINB12, FOLATE, FERRITIN, TIBC, IRON, RETICCTPCT in the last 72 hours. Urinalysis    Component Value Date/Time   COLORURINE YELLOW 11/04/2017 0357   APPEARANCEUR TURBID (A) 11/04/2017 0357   LABSPEC 1.023 11/04/2017 0357   PHURINE 5.0 11/04/2017 0357   GLUCOSEU NEGATIVE 11/04/2017 0357   HGBUR SMALL (A) 11/04/2017 0357   BILIRUBINUR NEGATIVE 11/04/2017 0357   KETONESUR NEGATIVE 11/04/2017 0357   PROTEINUR NEGATIVE 11/04/2017 0357   UROBILINOGEN 0.2 12/29/2014 2314   NITRITE NEGATIVE 11/04/2017 0357   LEUKOCYTESUR NEGATIVE 11/04/2017 0357     Microbiology Recent Results (from the past 240 hour(s))  Urine Culture     Status: Abnormal   Collection Time: 11/02/17 10:56 AM  Result Value Ref Range Status   Specimen Description URINE, CLEAN CATCH  Final   Special Requests   Final    NONE Performed at [facility], [place] [address], [place], [place] [address]    Culture MULTIPLE SPECIES PRESENT, SUGGEST RECOLLECTION (A)  Final   Report Status 11/03/2017 FINAL  Final  Culture, Urine     Status: None   Collection Time: 11/04/17  3:57 AM  Result Value Ref Range Status   Specimen Description URINE, CATHETERIZED  Final   Special Requests NONE  Final   Culture   Final    NO GROWTH Performed at [facility], [place] [address], [place], [place] [address]    Report Status 11/05/2017 FINAL  Final       Inpatient  Medications:   Scheduled Meds: . ALPRAZolam  1 mg Oral QHS  . apixaban  5 mg Oral BID  . atorvastatin  80 mg Oral Daily  . feeding supplement  1 Container Oral TID WC  . levothyroxine  50 mcg Oral QAC breakfast  . mouth rinse  15 mL Mouth Rinse BID  . omega-3 acid ethyl esters  2 g Oral Daily  . pantoprazole  40 mg Oral Daily  . potassium chloride SA  40 mEq Oral Daily  . topiramate  100 mg Oral BID  . Vitamin D (Ergocalciferol)  50,000 Units Oral Once per day on Tue Thu   Continuous Infusions: . 0.9 % NaCl with KCl 20 mEq / L       Radiological Exams on Admission: Dg Abd 1 View  Result Date: 11/08/2017 CLINICAL DATA:  Nausea, abdominal distention. EXAM: ABDOMEN - 1 VIEW COMPARISON:  None. FINDINGS: No small bowel dilatation is noted. Air-filled dilated colon is noted, with the largest diameter measured at 15.4 cm in transverse colon. Status post cholecystectomy. No abnormal calcifications are noted IMPRESSION: Air-filled dilated colon is noted measuring 15.4 cm in largest diameter. This is concerning for possible ileus. Electronically Signed   By: Marijo Conception, M.D.   On: 11/08/2017 13:22    Impression/Recommendations Active Problems:   CVA (cerebral vascular accident) ([place])   Left middle cerebral artery stroke ([place])   1.  Abdominal distention/ileus: Patient noted to have colonic distention on x-ray.  Given history of loose bowel movements this morning, imperative to rule out C. difficile.  Ogilvie's syndrome in the setting of hypokalemia is on the differential as well.  Will keep n.p.o. for now.  Okay to give meds hoping adequate gastric and duodenal absorption.  Explained to family implications of using opiates for pain in the setting of ileus and risks associated.  They understand.  Will obtain CT (without contrast given renal function) for better imaging and rule out toxic megacolon.  2.  Severe hypokalemia: In the setting of diuretics, poor oral intake and diarrhea.  Ordered  additional IV replacements as well as oral replacement earlier today.  Will obtain stat lab assessment.  Hold Demadex which might be exacerbating hypokalemia.  Also changed maintenance fluids to potassium mixture.  3.  Recent MCA stroke: Speech therapy has been following and patient currently on dysphagia 3 diet.  Will keep n.p.o. for now until abdominal distention improves.  Resume anticoagulation  4.  Paroxysmal atrial fibrillation: On anticoagulation.  5.  Hypertension/hyperlipidemia: Continue current medications if able to tolerate p.o.  If unable to tolerate p.o., will consider transitioning to IV medications.  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  Chronic diastolic CHF: Recent echo as part of stroke work-up showed EF of 65%.  Currently appears euvolemic with no active leg swellings.  Will hold diuretics.  8. AKI on CKD stage 2: He had and on admission was 1.28, now up to 2.1.  Hold diuretics, replace potassium and continue IV hydration.  Labs in a.m.  Full code as confirmed with family.   Thank you for this consultation.  Our [facility] hospitalist team will follow the patient with you.   Time Spent: 24 minutes   Guilford Shi M.D. [facility] 11/08/2017, 4:56 PM

## 2017-11-08 NOTE — Progress Notes (Signed)
Occupational Therapy Session Note  Patient Details  Name: Holly Flores MRN: [id number] Date of Birth: 08-21-1945  Today's Date: 11/08/2017 OT Individual Time: 0910-1007 OT Individual Time Calculation (min): 57 min    Short Term Goals: Week 1:  OT Short Term Goal 1 (Week 1): Pt will complete bathing with max assist of one caregiver at sit > stand level OT Short Term Goal 2 (Week 1): Pt will complete LB dressing with max assist of one caregiver at sit > stand level OT Short Term Goal 3 (Week 1): Pt will complete toilet transfer with max assist of one caregiver OT Short Term Goal 4 (Week 1): Pt will complete UB dressing with mod assist  Skilled Therapeutic Interventions/Progress Updates:    Treatment session with focus on ADL retraining with rolling, sit > stand, and attention to RUE during self-care tasks.  Pt received supine in bed expressing desire to complete bathing/dressing.  Completed LB bathing at bed level with focus on sequencing with rolling.  Pt incontinent of bowel, therefore completed hygiene at bed level and donned clean brief.  Mod assist rolling to Rt and max assist rolling to Lt.  Sit > stand in [place] with focus on RUE placement and weight shift for sit > stand.  Engaged in [place] bathing, dressing, and grooming tasks seated at sink with focus on positioning of RUE to minimize pain and subluxation (as RUE often hangs off side of chair).  Utilized hand over hand to incorporate RUE into bathing and even reaching to turn on water.  Pt flat throughout session and returned to bed via Stedy due to fatigue and low potassium.  RN notified of position to allow her to start next IV of potassium.  Therapy Documentation Precautions:  Precautions Precautions: Fall Precaution Comments: R hemi, R inattention Restrictions Weight Bearing Restrictions: No Vital Signs: Therapy Vitals Temp Source: Oral Pulse Rate: 82 Resp: 18 BP: 104/79 Patient Position (if appropriate): Lying Oxygen  Therapy SpO2: 99 % O2 Device: Room Air Pain: Pain Assessment Pain Scale: 0-10 Pain Score: 4  Pain Type: Acute pain Pain Location: Abdomen Pain Descriptors / Indicators: Discomfort Pain Intervention(s): Medication (See eMAR);Repositioned  See Function Navigator for Current Functional Status.   Therapy/Group: Individual Therapy  [name] 11/08/2017, 12:16 PM

## 2017-11-08 NOTE — Progress Notes (Signed)
Subjective/Complaints:  Reviewed BMET, received KCL 75mq IV x 3 yesterday Remains on torsemide Home dose KCL is 12m but ate banana daily  ROS-  Neg CP, SOB, N/V/D  Objective: Vital Signs: Blood pressure 110/74, pulse 79, temperature 98.3 F (36.8 C), temperature source Oral, resp. rate 18, SpO2 96 %. No results found. Results for orders placed or performed during the hospital encounter of 11/01/17 (from the past 72 hour(s))  Basic metabolic panel     Status: Abnormal   Collection Time: 11/07/17  6:29 AM  Result Value Ref Range   Sodium 142 135 - 145 mmol/L   Potassium 2.8 (L) 3.5 - 5.1 mmol/L   Chloride 108 98 - 111 mmol/L   CO2 20 (L) 22 - 32 mmol/L   Glucose, Bld 126 (H) 70 - 99 mg/dL   BUN 53 (H) 8 - 23 mg/dL   Creatinine, Ser 1.94 (H) 0.44 - 1.00 mg/dL   Calcium 9.2 8.9 - 10.3 mg/dL   GFR calc non Af Amer 25 (L) >60 mL/min   GFR calc Af Amer 29 (L) >60 mL/min    Comment: (NOTE) The eGFR has been calculated using the CKD EPI equation. This calculation has not been validated in all clinical situations. eGFR's persistently <60 mL/min signify possible Chronic Kidney Disease.    Anion gap 14 5 - 15    Comment: Performed at [facility] [place] [id number] metabolic panel     Status: Abnormal   Collection Time: 11/08/17  6:49 AM  Result Value Ref Range   Sodium 142 135 - 145 mmol/L   Potassium 2.7 (LL) 3.5 - 5.1 mmol/L    Comment: CRITICAL RESULT CALLED TO, READ BACK BY AND VERIFIED WITH: E.GRECU,RN [id number]/23/19 CLARK,S    Chloride 110 98 - 111 mmol/L   CO2 21 (L) 22 - 32 mmol/L   Glucose, Bld 128 (H) 70 - 99 mg/dL   BUN 57 (H) 8 - 23 mg/dL   Creatinine, Ser 2.17 (H) 0.44 - 1.00 mg/dL   Calcium 8.9 8.9 - 10.3 mg/dL   GFR calc non Af Amer 22 (L) >60 mL/min   GFR calc Af Amer 25 (L) >60 mL/min    Comment: (NOTE) The eGFR has been calculated using the CKD EPI equation. This calculation has not been validated in all clinical  situations. eGFR's persistently <60 mL/min signify possible Chronic Kidney Disease.    Anion gap 11 5 - 15    Comment: Performed at [facility] [place]   HEENT: normal Cardio: RRR and no murmur Resp: CTA B/L and Unlabored GI: BS positive and NT, ND Extremity:  Pulses positive and No Edema Skin:   Intact Neuro: Alert/Oriented, Normal Sensory, Abnormal Motor 0/5 on RIght side and Dysarthric Musc/Skel:  Other no pain in UE or LE Gen NAD   Assessment/Plan: 1. 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 Physiatrist is providing close team supervision and 24 hour management of active medical problems listed below. Physiatrist and rehab team continue to assess barriers to discharge/monitor patient progress toward functional and medical goals. FIM: Function - Bathing Position: Wheelchair/chair at sink Body parts bathed by patient: Chest, Abdomen, Right arm, Right upper leg, Left upper leg Body parts bathed by helper: Left arm, Front perineal area, Buttocks, Right lower leg,  Left lower leg, Back Assist Level: (Mod assist)  Function- Upper Body Dressing/Undressing What is the patient wearing?: Pull over shirt/dress Pull over shirt/dress - Perfomed by patient: Thread/unthread left sleeve Pull over shirt/dress - Perfomed by helper: Thread/unthread right sleeve, Put head through opening, Pull shirt over trunk Button up shirt - Perfomed by patient: Thread/unthread left sleeve Button up shirt - Perfomed by helper: Thread/unthread right sleeve, Button/unbutton shirt, Pull shirt around back Assist Level: (Max assist) Function - Lower Body Dressing/Undressing What is the patient wearing?: Pants, Non-skid slipper socks Position: (Sit> stand in [place]) Pants- Performed by helper: Thread/unthread right pants leg,  Thread/unthread left pants leg, Pull pants up/down Non-skid slipper socks- Performed by helper: Don/doff right sock, Don/doff left sock Assist for footwear: Maximal assist Assist for lower body dressing: (Total assist)  Function - Toileting Toileting steps completed by helper: Adjust clothing prior to toileting, Performs perineal hygiene, Adjust clothing after toileting Toileting Assistive Devices: Other (comment)(bedpan) Assist level: Two helpers  Function - Air cabin crew transfer activity did not occur: Safety/medical concerns Toilet transfer assistive device: Facilities manager lift: Stedy Assist level to toilet: Moderate assist (Pt 50 - 74%/lift or lower) Assist level from toilet: Moderate assist (Pt 50 - 74%/lift or lower) Assist level to bedside commode (at bedside): Dependent (Pt equals 0%) Assist level from bedside commode (at bedside): Dependent (Pt equals 0%)  Function - Chair/bed transfer Chair/bed transfer activity did not occur: Safety/medical concerns Chair/bed transfer method: Other Chair/bed transfer assist level: Touching or steadying assistance (Pt > 75%) Chair/bed transfer assistive device: Mechanical lift Mechanical lift: Stedy Chair/bed transfer details: Manual facilitation for weight shifting, Verbal cues for technique, Manual facilitation for placement  Function - Locomotion: Wheelchair Will patient use wheelchair at discharge?: Yes Type: Manual Wheelchair activity did not occur: Safety/medical concerns Max wheelchair distance: 50 Assist Level: Touching or steadying assistance (Pt > 75%) Wheel 50 feet with 2 turns activity did not occur: Safety/medical concerns Assist Level: Touching or steadying assistance (Pt > 75%) Wheel 150 feet activity did not occur: Safety/medical concerns Turns around,maneuvers to table,bed, and toilet,negotiates 3% grade,maneuvers on rugs and over doorsills: No Function - Locomotion: Ambulation Ambulation  activity did not occur: Safety/medical concerns Assistive device: Rail in hallway Max distance: 57f  Assist level: Maximal assist (Pt 25 - 49%) Walk 10 feet activity did not occur: Safety/medical concerns Walk 50 feet with 2 turns activity did not occur: Safety/medical concerns Walk 150 feet activity did not occur: Safety/medical concerns Walk 10 feet on uneven surfaces activity did not occur: Safety/medical concerns  Function - Comprehension Comprehension: Auditory Comprehension assist level: Follows basic conversation/direction with no assist  Function - Expression Expression: Verbal Expression assist level: Expresses basic 50 - 74% of the time/requires cueing 25 - 49% of the time. Needs to repeat parts of sentences.  Function - Social Interaction Social Interaction assist level: Interacts appropriately 75 - 89% of the time - Needs redirection for appropriate language or to initiate interaction.  Function - Problem Solving Problem solving assist level: Solves basic 75 - 89% of the time/requires cueing 10 - 24% of the time  Function - Memory Memory assist level: Recognizes or recalls 50 - 74% of the time/requires cueing 25 - 49% of the time Patient normally able to recall (first 3 days only): That he or she is in a hospital, Staff names and faces, Location of own room, Current season  Medical Problem List and Plan: 1.  Right side weakness with dysarthria/diplopia secondary to acute  infarction left external capsule with chronic micro-hemorrhagic left frontal white matter adjacent to the head of the caudate. Cont CIR PT,  OT, SLP 2.  DVT Prophylaxis/Anticoagulation: Eliquis resumed 3. Pain Management: Topamax 100 mg twice daily, oxycodone as needed 4. Mood: Xanax 1 mg nightly 5. Neuropsych: This patient is capable of making decisions on her own behalf. 6. Skin/Wound Care: Routine skin checks 7. Fluids/Electrolytes/Nutrition: Routine in and outs with follow-up chemistries, intake 360  mL on 11/04/2017, poor intake also on torsemide will check basic metabolic package Will run 4 more K riders, increase oral supplement to 57mq daily CHeck serum Mg++, may need to supplement at well  8.  Diastolic congestive heart failure.  Demadex 20 mg daily.  Monitor for any signs of fluid overload 9.  PAF.  Eliquis resumed.  Cardiac rate controlled 10.  Hypertension.  Permissive hypertension.  Monitor with increased mobility Vitals:   11/08/17 0548 11/08/17 0601  BP: 120/81 110/74  Pulse: 79 79  Resp: 18 18  Temp: 98 F (36.7 C) 98.3 F (36.8 C)  SpO2: 95% 96%  Controlled 9/22   11.  COPD.  Check oxygen saturations every shift.  Continue nebulizers- no wheezing 12.  CKD stage II.  Follow-up chemistries 13.  Hypothyroidism.  TSH 1.882. Synthroid 14.  Hyperlipidemia.  Lipitor/Lovaza 15.  GERD.  Protonix 16.  Constipation.  Laxative assistance, had cont liquid stool 17.  Leukocytosis with + UA,cath specimen, no growth LOS (Days) 7 A FACE TO FACE EVALUATION WAS PERFORMED  ACharlett Blake9/23/2019, 8:16 AM

## 2017-11-08 NOTE — Progress Notes (Addendum)
Physical Therapy Session Note  Patient Details  Name: Holly Flores MRN: [id number] Date of Birth: January 16, 1946  Today's Date: 11/08/2017 PT Individual Time: 1660-6004 PT Individual Time Calculation (min): 42 min   Short Term Goals: Week 1:  PT Short Term Goal 1 (Week 1): Pt will complete bed mobility with mod assist +1. PT Short Term Goal 2 (Week 1): Pt will consistently complete bed<>w/c transfers with max assist +1. PT Short Term Goal 3 (Week 1): Pt will propel w/c 30 ft with min assist.  Skilled Therapeutic Interventions/Progress Updates:  Pt received in bed reporting not feeling well (did not state any specific symptoms) but agreeable to tx, reporting burning pain at IV site & RN made aware. Pt transferred to EOB with hospital bed features and mod assist to transfer RLE & RUE to EOB but pt able to pull trunk upright with use of bed rail. Pt transferred bed<>w/c via stedy lift with min assist for sit>stand by pulling up on stedy bar. Pt able to maintain static sitting balance in stedy with min assist and min cuing for midline orientation to move shoulders L. Pt transported to dayroom via w/c total assist for time management & set up at [facility] from w/c level. Pt required max manual facilitation to utilize kinetron and demonstrates poor motor planning even with LLE, requiring assistance. Transported pt to gym where pt continues to report feeling unwell & RN & PA made aware but clears pt for continuation in therapy. Attempted to have pt stand in stedy lift to engage in therapuetic activity but pt then reports "I feel like I'm going to pass out" therefore pt returned to room and bed in same manner as noted above and max assist for sit>supine and +2 to scoot to [facility]. Pt left in bed in care of RN. Pt appearing as if she feels unwell throughout session.   Therapy Documentation Precautions:  Precautions Precautions: Fall Precaution Comments: R hemi, R inattention Restrictions Weight Bearing  Restrictions: No   General: PT Amount of Missed Time (min): 18 Minutes PT Missed Treatment Reason: Patient ill (Comment)   See Function Navigator for Current Functional Status.   Therapy/Group: Individual Therapy  Waunita Schooner 11/08/2017, 12:11 PM

## 2017-11-08 NOTE — Significant Event (Signed)
I paged the MD to inform her about lab results.

## 2017-11-08 NOTE — Significant Event (Signed)
Dr was informed about critical lab K level.

## 2017-11-08 NOTE — Progress Notes (Addendum)
KUB results show air-filled dilated colon measuring 15.4 cm concerning for possible ileus.  Will downgrade diet to full liquids.  Currently maintained on IV fluids for hydration with mildly elevated creatinine and follow-up chemistries in the a.m.  Will ask medicine team for follow-up and regarding medical issues of hypokalemia, ileus and AKI

## 2017-11-08 NOTE — Progress Notes (Signed)
Occupational Therapy Note  Patient Details  Name: Holly Flores MRN: [id number] Date of Birth: [birth year]  Today's Date: 11/08/2017 OT Missed Time: 4 Minutes Missed Time Reason: Other (comment)(off unit for KUB)  Pt missed 30 mins scheduled OT due to off unit for KUB.   Will continue to follow per POC.  [name] 11/08/2017, 4:02 PM

## 2017-11-08 NOTE — Progress Notes (Signed)
Speech Language Pathology Weekly Progress and Session Note  Patient Details  Name: NASHAE MAUDLIN MRN: [id number] Date of Birth: 01/08/1946  Beginning of progress report period: November 01, 2017 End of progress report period: November 08, 2017  Today's Date: 11/08/2017 SLP Individual Time: 8466-5993 SLP Individual Time Calculation (min): 45 min  Short Term Goals: Week 1: SLP Short Term Goal 1 (Week 1): Pt will utilizie speech intelligibiity strategies at sentence level with Mod A verbal cues to achieve 80% intelligibility. SLP Short Term Goal 1 - Progress (Week 1): Not met SLP Short Term Goal 2 (Week 1): Pt will demonstrate functional problem solving for mildly complex tasks with Mod A verbal cues. SLP Short Term Goal 2 - Progress (Week 1): Not met SLP Short Term Goal 3 (Week 1): Pt will self-monitor and self-correct functional errors with mod A verbal cues. SLP Short Term Goal 3 - Progress (Week 1): Not met SLP Short Term Goal 4 (Week 1): Pt will demonstrate selective attention during functional tasks for 30 minutes with Min A verbal cues for redirection. SLP Short Term Goal 4 - Progress (Week 1): Met SLP Short Term Goal 5 (Week 1): Pt will utilize compensatory strategies to recall new, daily information with Min A verbal and question cues. SLP Short Term Goal 5 - Progress (Week 1): Met SLP Short Term Goal 6 (Week 1): Pt will consume regular texture trials without overt s/s aspiration with Mod I for use of swallowing compensatory strategies (oral clearance.) SLP Short Term Goal 6 - Progress (Week 1): Not met    New Short Term Goals: Week 2: SLP Short Term Goal 1 (Week 2): Pt will utilizie speech intelligibiity strategies at sentence level with Mod A verbal cues to achieve 80% intelligibility. SLP Short Term Goal 2 (Week 2): Pt will demonstrate functional problem solving for mildly complex tasks with Mod A verbal cues. SLP Short Term Goal 3 (Week 2): Pt will self-monitor and  self-correct functional errors with Mod A verbal cues. SLP Short Term Goal 4 (Week 2): Pt will demonstrate selective attention during functional tasks for 30 minutes with supervision cues for redirection. SLP Short Term Goal 5 (Week 2): Pt will consume regular texture trials without overt s/s aspiration with Mod I for use of swallowing compensatory strategies (oral clearance.) SLP Short Term Goal 6 (Week 2): Pt will demonstrate intellectual awareness by listing 2 physical and 2 cognitive-linguistic deficits with Mod A cues.  Week 6:    Weekly Progress Updates: Pt has made slow progress this reporting period and as a result she has met 2 of 6 STGs. Pt continues to display moderate to severe communication deficits d/t right facial weakness as well as difficulty increasing and coordinating respiratory support during phonation. Pt requires Max A cues to utilize speech intelligibility strategies at the phrase level to achieve ~ 50-75% intelligibility. Pt with overall decreased awareness of deficits related to CVA and as a result she frequently declines trials of regular diet textures. Skilled ST continues to be required to target speech intelligibility, cognitive awareness, semi-complex problem solving, attention and improving oral phase with regular diet textures. Anticipate that pt will require supervision at discharge and HHST follow up. If progress continues to slow, pt's LTGs might need to be downgraded.   ntensity: Minumum of 1-2 x/day, 30 to 90 minutes Frequency: 3 to 5 out of 7 days Duration/Length of Stay: 10/8 Treatment/Interventions: Cognitive remediation/compensation;Cueing hierarchy;Functional tasks;Medication managment;Speech/Language facilitation;Dysphagia/aspiration precaution training   Daily Session  Skilled Therapeutic Interventions: Skilled  treatment session focused on dysphagia, communication goals, and cognition goals. SLP received pt in bed consuming breakfast (dysphagia 3 with thin  liquids). Pt was appropriately positioned but she had removed upper dentures. She states that she doesn't eat meals with upper dentures in place. Pt with functional ability with dysphagia 3 items, refused any further supplemental trials of regular. Pt with stopping of several consonants this session. Pt required Max A cues to coordinate breathing with phonation, increase volume and over-articulation at the phrase level to achieve ~ 50% intelligibility. Pt required Mod A cues to recall strategies throughout session.  Pt with some hypernasality with phrases this session and Max A cues to self-monitor. With supervision cues, pt able to perform mental money problems. Pt was left upright in bed, bed alarm on and all needs within reach.      Function:   Eating Eating   Modified Consistency Diet: Yes Eating Assist Level: Set up assist for;Supervision or verbal cues   Eating Set Up Assist For: Opening containers       Cognition Comprehension Comprehension assist level: Follows basic conversation/direction with no assist  Expression   Expression assist level: Expresses basic 50 - 74% of the time/requires cueing 25 - 49% of the time. Needs to repeat parts of sentences.  Social Interaction Social Interaction assist level: Interacts appropriately 75 - 89% of the time - Needs redirection for appropriate language or to initiate interaction.  Problem Solving Problem solving assist level: Solves basic 75 - 89% of the time/requires cueing 10 - 24% of the time  Memory Memory assist level: Recognizes or recalls 50 - 74% of the time/requires cueing 25 - 49% of the time   General    Pain    Therapy/Group: Individual Therapy  Castin Donaghue 11/08/2017, 8:10 AM

## 2017-11-08 NOTE — Plan of Care (Signed)
  Problem: Consults Goal: RH STROKE PATIENT EDUCATION Description See Patient Education module for education specifics  Outcome: Progressing Goal: Nutrition Consult-if indicated Outcome: Progressing   Problem: RH BOWEL ELIMINATION Goal: RH STG MANAGE BOWEL WITH ASSISTANCE Description STG Manage Bowel with  [place].  Outcome: Progressing Flowsheets (Taken 11/08/2017 1438) STG: Pt will manage bowels with assistance: 2-Maximum assistance Goal: RH STG MANAGE BOWEL W/MEDICATION W/ASSISTANCE Description STG Manage Bowel with Medication with min  Assistance.  Outcome: Progressing Flowsheets (Taken 11/08/2017 1438) STG: Pt will manage bowels with medication with assistance: 2-Maximum assistance   Problem: RH BLADDER ELIMINATION Goal: RH STG MANAGE BLADDER WITH ASSISTANCE Description STG Manage Bladder With  Min Assistance  Outcome: Progressing Flowsheets (Taken 11/08/2017 1438) STG: Pt will manage bladder with assistance: 2-Maximum assistance   Problem: RH SAFETY Goal: RH STG ADHERE TO SAFETY PRECAUTIONS W/ASSISTANCE/DEVICE Description STG Adhere to Safety Precautions With  Min Assistance/Device.  Outcome: Progressing Flowsheets (Taken 11/08/2017 1438) STG:Pt will adhere to safety precautions with assistance/device: 2-Maximum assistance   Problem: RH COGNITION-NURSING Goal: RH STG USES MEMORY AIDS/STRATEGIES W/ASSIST TO PROBLEM SOLVE Description STG Uses Memory Aids/Strategies With  Min Assistance to Problem Solve.  Outcome: Progressing Flowsheets (Taken 11/08/2017 1438) STG: Uses memory aids/strategies with assistance: 5-Supervision/set up Goal: RH STG ANTICIPATES NEEDS/CALLS FOR ASSIST W/ASSIST/CUES Description STG Anticipates Needs/Calls for Assist With  Min Assistance/Cues.  Outcome: Progressing Flowsheets (Taken 11/08/2017 1438) STG: Anticipates needs/calls for assistance with assistance/cues: 5-Supervision/set up   Problem: RH PAIN MANAGEMENT Goal: RH STG PAIN  MANAGED AT OR BELOW PT'S PAIN GOAL Description Less than 2   Outcome: Progressing   Problem: RH KNOWLEDGE DEFICIT Goal: RH STG INCREASE KNOWLEDGE OF HYPERTENSION Description Patient and family will verbalize understanding of treatment of hypertension prior to discharge.  Outcome: Progressing Goal: RH STG INCREASE KNOWLEDGE OF DYSPHAGIA/FLUID INTAKE Description Patient and family will be able to verbalize understanding of dysphasia and swallowing precautions.  Outcome: Progressing Goal: RH STG INCREASE KNOWLEGDE OF HYPERLIPIDEMIA Description Patient and family will verbalize understanding of cholesterol medications and hyperlipidemia  Outcome: Progressing Goal: RH STG INCREASE KNOWLEDGE OF STROKE PROPHYLAXIS Description Patient and family will be able to verbalize understanding of stroke symptoms.    Outcome: Progressing

## 2017-11-09 ENCOUNTER — Encounter: Payer: Self-pay | Admitting: Family Medicine

## 2017-11-09 ENCOUNTER — Inpatient Hospital Stay (HOSPITAL_COMMUNITY): Payer: Medicare Other

## 2017-11-09 ENCOUNTER — Inpatient Hospital Stay (HOSPITAL_COMMUNITY): Payer: Medicare Other | Admitting: Physical Therapy

## 2017-11-09 ENCOUNTER — Inpatient Hospital Stay (HOSPITAL_COMMUNITY)
Admission: AD | Admit: 2017-11-09 | Discharge: 2017-11-15 | DRG: 345 | Disposition: A | Discharge status: Rehabilitation Facility | Payer: Medicare Other | Source: Intra-hospital | Attending: Internal Medicine | Admitting: Internal Medicine

## 2017-11-09 ENCOUNTER — Inpatient Hospital Stay (HOSPITAL_COMMUNITY): Payer: Medicare Other | Admitting: Occupational Therapy

## 2017-11-09 ENCOUNTER — Other Ambulatory Visit: Payer: Self-pay

## 2017-11-09 DIAGNOSIS — F329 Major depressive disorder, single episode, unspecified: Secondary | ICD-10-CM | POA: Diagnosis present

## 2017-11-09 DIAGNOSIS — I69351 Hemiplegia and hemiparesis following cerebral infarction affecting right dominant side: Secondary | ICD-10-CM

## 2017-11-09 DIAGNOSIS — I69322 Dysarthria following cerebral infarction: Secondary | ICD-10-CM | POA: Diagnosis not present

## 2017-11-09 DIAGNOSIS — N189 Chronic kidney disease, unspecified: Secondary | ICD-10-CM

## 2017-11-09 DIAGNOSIS — F419 Anxiety disorder, unspecified: Secondary | ICD-10-CM | POA: Diagnosis present

## 2017-11-09 DIAGNOSIS — K5939 Other megacolon: Secondary | ICD-10-CM | POA: Diagnosis present

## 2017-11-09 DIAGNOSIS — I480 Paroxysmal atrial fibrillation: Secondary | ICD-10-CM | POA: Diagnosis present

## 2017-11-09 DIAGNOSIS — I725 Aneurysm of other precerebral arteries: Secondary | ICD-10-CM | POA: Diagnosis present

## 2017-11-09 DIAGNOSIS — Z87891 Personal history of nicotine dependence: Secondary | ICD-10-CM | POA: Diagnosis not present

## 2017-11-09 DIAGNOSIS — R140 Abdominal distension (gaseous): Secondary | ICD-10-CM

## 2017-11-09 DIAGNOSIS — G8929 Other chronic pain: Secondary | ICD-10-CM | POA: Diagnosis present

## 2017-11-09 DIAGNOSIS — Z7901 Long term (current) use of anticoagulants: Secondary | ICD-10-CM

## 2017-11-09 DIAGNOSIS — E785 Hyperlipidemia, unspecified: Secondary | ICD-10-CM | POA: Diagnosis present

## 2017-11-09 DIAGNOSIS — N179 Acute kidney failure, unspecified: Secondary | ICD-10-CM | POA: Diagnosis present

## 2017-11-09 DIAGNOSIS — K219 Gastro-esophageal reflux disease without esophagitis: Secondary | ICD-10-CM | POA: Diagnosis present

## 2017-11-09 DIAGNOSIS — G8101 Flaccid hemiplegia affecting right dominant side: Secondary | ICD-10-CM | POA: Diagnosis present

## 2017-11-09 DIAGNOSIS — K5981 Ogilvie syndrome: Secondary | ICD-10-CM

## 2017-11-09 DIAGNOSIS — K567 Ileus, unspecified: Secondary | ICD-10-CM

## 2017-11-09 DIAGNOSIS — I252 Old myocardial infarction: Secondary | ICD-10-CM | POA: Diagnosis not present

## 2017-11-09 DIAGNOSIS — Z96651 Presence of right artificial knee joint: Secondary | ICD-10-CM | POA: Diagnosis present

## 2017-11-09 DIAGNOSIS — E876 Hypokalemia: Secondary | ICD-10-CM | POA: Diagnosis present

## 2017-11-09 DIAGNOSIS — E8809 Other disorders of plasma-protein metabolism, not elsewhere classified: Secondary | ICD-10-CM | POA: Diagnosis present

## 2017-11-09 DIAGNOSIS — K598 Other specified functional intestinal disorders: Secondary | ICD-10-CM

## 2017-11-09 DIAGNOSIS — G43919 Migraine, unspecified, intractable, without status migrainosus: Secondary | ICD-10-CM | POA: Diagnosis present

## 2017-11-09 DIAGNOSIS — J449 Chronic obstructive pulmonary disease, unspecified: Secondary | ICD-10-CM | POA: Diagnosis present

## 2017-11-09 DIAGNOSIS — R110 Nausea: Secondary | ICD-10-CM

## 2017-11-09 DIAGNOSIS — D62 Acute posthemorrhagic anemia: Secondary | ICD-10-CM | POA: Diagnosis present

## 2017-11-09 DIAGNOSIS — I130 Hypertensive heart and chronic kidney disease with heart failure and stage 1 through stage 4 chronic kidney disease, or unspecified chronic kidney disease: Secondary | ICD-10-CM | POA: Diagnosis present

## 2017-11-09 DIAGNOSIS — N182 Chronic kidney disease, stage 2 (mild): Secondary | ICD-10-CM | POA: Diagnosis present

## 2017-11-09 DIAGNOSIS — N183 Chronic kidney disease, stage 3 (moderate): Secondary | ICD-10-CM | POA: Diagnosis present

## 2017-11-09 DIAGNOSIS — I10 Essential (primary) hypertension: Secondary | ICD-10-CM | POA: Diagnosis present

## 2017-11-09 DIAGNOSIS — E039 Hypothyroidism, unspecified: Secondary | ICD-10-CM | POA: Diagnosis present

## 2017-11-09 DIAGNOSIS — G4730 Sleep apnea, unspecified: Secondary | ICD-10-CM | POA: Diagnosis present

## 2017-11-09 DIAGNOSIS — Z90710 Acquired absence of both cervix and uterus: Secondary | ICD-10-CM | POA: Diagnosis not present

## 2017-11-09 DIAGNOSIS — I5032 Chronic diastolic (congestive) heart failure: Secondary | ICD-10-CM | POA: Diagnosis present

## 2017-11-09 DIAGNOSIS — Z9842 Cataract extraction status, left eye: Secondary | ICD-10-CM | POA: Diagnosis not present

## 2017-11-09 DIAGNOSIS — K599 Functional intestinal disorder, unspecified: Secondary | ICD-10-CM

## 2017-11-09 HISTORY — DX: Other specified functional intestinal disorders: K59.8

## 2017-11-09 HISTORY — DX: Ogilvie syndrome: K59.81

## 2017-11-09 LAB — MAGNESIUM: MAGNESIUM: 2.5 mg/dL — AB (ref 1.7–2.4)

## 2017-11-09 LAB — BASIC METABOLIC PANEL
Anion gap: 11 (ref 5–15)
BUN: 57 mg/dL — AB (ref 8–23)
CALCIUM: 9 mg/dL (ref 8.9–10.3)
CO2: 19 mmol/L — AB (ref 22–32)
Chloride: 112 mmol/L — ABNORMAL HIGH (ref 98–111)
Creatinine, Ser: 2.09 mg/dL — ABNORMAL HIGH (ref 0.44–1.00)
GFR calc Af Amer: 26 mL/min — ABNORMAL LOW (ref >60)
GFR, EST NON AFRICAN AMERICAN: 23 mL/min — AB (ref >60)
GLUCOSE: 113 mg/dL — AB (ref 70–99)
Potassium: 3.1 mmol/L — ABNORMAL LOW (ref 3.5–5.1)
Sodium: 142 mmol/L (ref 135–145)

## 2017-11-09 LAB — CBC
HCT: 43.9 % (ref 36.0–46.0)
Hemoglobin: 13.9 g/dL (ref 12.0–15.0)
MCH: 29.7 pg (ref 26.0–34.0)
MCHC: 31.7 g/dL (ref 30.0–36.0)
MCV: 93.8 fL (ref 78.0–100.0)
Platelets: 352 10*3/uL (ref 150–400)
RBC: 4.68 MIL/uL (ref 3.87–5.11)
RDW: 13.9 % (ref 11.5–15.5)
WBC: 16.5 10*3/uL — ABNORMAL HIGH (ref 4.0–10.5)

## 2017-11-09 MED ORDER — ATORVASTATIN CALCIUM 80 MG PO TABS
80.0000 mg | ORAL_TABLET | Freq: Every day | ORAL | Status: DC
  Administered 2017-11-09 – 2017-11-15 (×7): 80 mg via ORAL
  Filled 2017-11-09 (×7): qty 1

## 2017-11-09 MED ORDER — POLYETHYLENE GLYCOL 3350 17 G PO PACK
17.0000 g | PACK | Freq: Two times a day (BID) | ORAL | Status: DC
  Administered 2017-11-09 – 2017-11-15 (×11): 17 g via ORAL
  Filled 2017-11-09 (×11): qty 1

## 2017-11-09 MED ORDER — ALPRAZOLAM 0.5 MG PO TABS
1.0000 mg | ORAL_TABLET | Freq: Every day | ORAL | Status: DC
  Administered 2017-11-09 – 2017-11-14 (×6): 1 mg via ORAL
  Filled 2017-11-09 (×6): qty 2

## 2017-11-09 MED ORDER — LEVOTHYROXINE SODIUM 50 MCG PO TABS
50.0000 ug | ORAL_TABLET | Freq: Every day | ORAL | Status: DC
  Administered 2017-11-10 – 2017-11-15 (×6): 50 ug via ORAL
  Filled 2017-11-09 (×6): qty 1

## 2017-11-09 MED ORDER — POTASSIUM CHLORIDE CRYS ER 20 MEQ PO TBCR
40.0000 meq | EXTENDED_RELEASE_TABLET | Freq: Once | ORAL | Status: AC
  Administered 2017-11-09: 40 meq via ORAL
  Filled 2017-11-09: qty 2

## 2017-11-09 MED ORDER — ACETAMINOPHEN 650 MG RE SUPP: 650.0000 mg | Freq: Four times a day (QID) | RECTAL | Status: DC | PRN

## 2017-11-09 MED ORDER — IOPAMIDOL (ISOVUE-300) INJECTION 61%
30.0000 mL | Freq: Once | INTRAVENOUS | Status: AC | PRN
  Administered 2017-11-09: 30 mL via ORAL

## 2017-11-09 MED ORDER — ACETAMINOPHEN 650 MG RE SUPP: 650.0000 mg | Freq: Four times a day (QID) | RECTAL | 0 refills | Status: DC | PRN

## 2017-11-09 MED ORDER — LEVOFLOXACIN IN D5W 250 MG/50ML IV SOLN: 250.0000 mg | INTRAVENOUS | Status: DC

## 2017-11-09 MED ORDER — APIXABAN 5 MG PO TABS
5.0000 mg | ORAL_TABLET | Freq: Two times a day (BID) | ORAL | Status: DC
  Administered 2017-11-09 – 2017-11-15 (×13): 5 mg via ORAL
  Filled 2017-11-09 (×13): qty 1

## 2017-11-09 MED ORDER — OMEGA-3-ACID ETHYL ESTERS 1 G PO CAPS: 2.0000 g | ORAL_CAPSULE | Freq: Every day | ORAL | Status: DC

## 2017-11-09 MED ORDER — METRONIDAZOLE IN NACL 5-0.79 MG/ML-% IV SOLN: 500.0000 mg | Freq: Three times a day (TID) | INTRAVENOUS | Status: DC

## 2017-11-09 MED ORDER — KCL IN DEXTROSE-NACL 40-5-0.45 MEQ/L-%-% IV SOLN
INTRAVENOUS | Status: DC
  Administered 2017-11-09 – 2017-11-11 (×4): via INTRAVENOUS
  Filled 2017-11-09 (×8): qty 1000

## 2017-11-09 MED ORDER — KCL IN DEXTROSE-NACL 10-5-0.45 MEQ/L-%-% IV SOLN: 75.0000 mL | INTRAVENOUS | Status: DC

## 2017-11-09 MED ORDER — PANTOPRAZOLE SODIUM 40 MG PO TBEC
40.0000 mg | DELAYED_RELEASE_TABLET | Freq: Every day | ORAL | Status: DC
  Administered 2017-11-10 – 2017-11-15 (×6): 40 mg via ORAL
  Filled 2017-11-09 (×6): qty 1

## 2017-11-09 MED ORDER — PANTOPRAZOLE SODIUM 40 MG PO TBEC: 40.0000 mg | DELAYED_RELEASE_TABLET | Freq: Every day | ORAL | Status: DC

## 2017-11-09 MED ORDER — OXYCODONE HCL 5 MG PO TABS: 5.0000 mg | ORAL_TABLET | ORAL | 0 refills | Status: DC | PRN

## 2017-11-09 MED ORDER — FLEET ENEMA 7-19 GM/118ML RE ENEM
1.0000 | ENEMA | Freq: Once | RECTAL | Status: AC
  Administered 2017-11-09: 1 via RECTAL
  Filled 2017-11-09: qty 1

## 2017-11-09 MED ORDER — IOPAMIDOL (ISOVUE-300) INJECTION 61%: 30.0000 mL | Freq: Once | INTRAVENOUS | Status: DC | PRN

## 2017-11-09 MED ORDER — ACETAMINOPHEN 325 MG PO TABS
650.0000 mg | ORAL_TABLET | Freq: Four times a day (QID) | ORAL | Status: DC | PRN
  Administered 2017-11-14: 650 mg via ORAL
  Filled 2017-11-09: qty 2

## 2017-11-09 MED ORDER — ATORVASTATIN CALCIUM 80 MG PO TABS: 80.0000 mg | ORAL_TABLET | Freq: Every day | ORAL | Status: DC

## 2017-11-09 NOTE — Discharge Summary (Signed)
NAME: Holly Flores, [place] MEDICAL RECORD [id number] ACCOUNT [id number] DATE OF BIRTH:01-30-1946 FACILITY: [facility] LOCATION: [facility] PHYSICIAN:ANDREW Letta Pate, MD  DISCHARGE SUMMARY  DATE OF DISCHARGE:  11/09/2017  DATE OF ADMISSION:  11/01/2017  DATE OF DISCHARGE:  11/09/2017   DISCHARGE DIAGNOSES: 1.  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 2.  Deep venous thrombosis prophylaxis with Eliquis. 3.  Pain management. 4.  Profound hypokalemia. 5.  Ileus. 6.  PAF. 7.  hypertension. 8.  Chronic kidney disease stage II. 9.  Hypothyroidism. 10.  Hyperlipidemia.  HOSPITAL COURSE:  This 72 year old right-handed female with history of PAF maintained on Eliquis, followed by Dr. Carlyle Dolly, CKD stage II and hypertension.  Lives with her husband, independent prior to admission.  Presented 10/29/2017 to [facility] with right-sided weakness, double vision, slurred speech.  Troponin negative.  MRI showed left brain infarction per CT and MRI acute infarction, left external capsule posteriorly.  CT angiogram of head and neck with no large vessel occlusion.   Echocardiogram with ejection fraction of 65%, no wall motion abnormalities without thrombus.  The patient did not receive tPA.  Her chronic Eliquis initially held and later resumed.  The patient was admitted for comprehensive rehabilitation program.  PAST MEDICAL HISTORY:  See discharge diagnoses.  SOCIAL HISTORY:  Lives with spouse, independent prior to admission.  FUNCTIONAL STATUS:  Upon admission to rehab services was max assist 2 feet hemi-walker +2 physical assist, stand pivot transfers, mod/max assist activities of daily living.  REHABILITATION HOSPITAL COURSE:  The patient was admitted to inpatient rehabilitation services.  Therapies initiated on a 3-hour daily basis, consisting of physical therapy, occupational therapy, speech therapy and rehabilitation  nursing.  The following  issues were addressed during patient's rehabilitation stay.  Pertaining to the patient's left external capsule CVA remained stable.  Her chronic Eliquis had been resumed for history of PAF.  Cardiac rate controlled.  Blood pressure is monitored.  The  patient was attending therapies noted on the afternoon of 09/21.  Increasing loose stools, abdominal distention noted hypokalemia receiving runs of potassium.  Abdominal film.  CT of the abdomen showed suspect ileus air filled dilated colon.  [facility]  medicine team consulted.  Noted elevated WBC of 19,000 and placed on Levaquin.  C. diff negative.  She was made n.p.o. with IV fluids, normal saline as advised with potassium supplement.  Her Demadex was held.  Latest creatinine 2.09, potassium 3.1.  WBC  16,500.  With noted ongoing medical changes, the medicine team felt the patient would be best served on acute care services.  Discharge taking place 11/09/2017.  All medicine changes made as per medical team.  All issues had been discussed with family.  TN/NUANCE D:11/09/2017 T:11/09/2017 JOB:[id number]

## 2017-11-09 NOTE — H&P (Signed)
History and Physical  Patient Name: Holly Flores     ZOX:[id number]    DOB: 25-Oct-1945    DOA: (Not on file) PCP: Octavio Graves, DO  Patient coming from: [facility] Inpatient Re[facility]  Chief Complaint: Abdominal distension, pain      HPI: Holly Flores is a 72 y.o. 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  She initially presented to [facility] on 9/13 with right-sided weakness slurred speech. She did not get tPA. MRI showed posterior left ext capsule stroke.  Echo normal, carotids normal.  Afib was known, and her Eliquis was resumed during index hospitalization.  The she was admitted to [facility] inpatient rehab on 9/16 for the last week, has been undergoing rehab for stroke.  However about 4 days ago, she started to have diffuse abdominal discomfort, worst in the left lower quadrant.  This progressed to abdominal distention, dry heaving. Creatinine increased.  Last night, the patient was evaluated by hospitalist service, KUB showed dilated colon, started on empiric Levaquin and Flagyl for colitis.  Cdiff testing negative.  Noted to have severe hypokalemia.    Started on potassium supplements, IV fluids, made NPO.  CT abdomen obtained that showed 13cm dilated colon, no obstruction, no inflammation.       ROS: Review of Systems  Constitutional: Negative for chills, fever and malaise/fatigue.  Respiratory: Negative for cough, sputum production and shortness of breath.   Cardiovascular: Negative for chest pain, palpitations, orthopnea and leg swelling.  Gastrointestinal: Positive for abdominal pain, constipation, diarrhea, nausea and vomiting. Negative for blood in stool and melena.  Genitourinary: Negative for dysuria, flank pain and urgency.  All other systems  reviewed and are negative.         Past Medical History:  Diagnosis Date  . Anxiety   . Arthritis   . CHF (congestive heart failure) ([place])   . COPD (chronic obstructive pulmonary disease) ([place])   . Depression   . GERD (gastroesophageal reflux disease)   . Goiter   . Hyperlipidemia   . Hypertension   . Hypothyroidism   . Kidney cysts    left side  . Myocardial infarction ([place])    8 yrs ago.   Marland Kitchen Shortness of breath   . Sleep apnea    uses CPAP, 3  . Stroke ([place])   . Stroke ([place])    OCULAR  LEFT  EYE    LAST YR.    Past Surgical History:  Procedure Laterality Date  . ABDOMINAL HYSTERECTOMY    . BACK SURGERY     x3,cervical and lunmbar, disc.  Marland Kitchen BREAST BIOPSY     left-non cancerous  . CARDIAC CATHETERIZATION    . CARPAL TUNNEL RELEASE Left   . CATARACT EXTRACTION W/PHACO  09/21/2011   Procedure: CATARACT EXTRACTION PHACO AND INTRAOCULAR LENS PLACEMENT (IOC);  Surgeon: Williams Che, MD;  Location: [facility] OR[facility];  Service: Ophthalmology;  Laterality: Left;  CDE:9.78  . CATARACT EXTRACTION W/PHACO Right 10/15/2014   Procedure: CATARACT EXTRACTION PHACO AND INTRAOCULAR LENS PLACEMENT ([place]);  Surgeon: Williams Che, MD;  Location: [facility] OR[facility];  Service: Ophthalmology;  Laterality: Right;  CDE:4.19  . CERVICAL SPINE SURGERY    . CESAREAN SECTION     x2  . CHOLECYSTECTOMY    . COLONOSCOPY  2006   RMR:  1. Internal hemorroids, otherwise normal rectum. 2. Pedunculated polyp at 35 cm. reomved with snare. The remainder of teh colonic mucosa appeared normal.   . COLONOSCOPY  2011   Dr. Gala Romney: multiple ascending colon polyps and rectal polyp, adenomatous  . COLONOSCOPY N/A 05/31/2014   Procedure: COLONOSCOPY;  Surgeon: Daneil Dolin, MD;  Location: [facility];  Service: Endoscopy;  Laterality: N/A;  130  . COLONOSCOPY WITH PROPOFOL N/A 06/24/2017   Procedure: COLONOSCOPY WITH PROPOFOL;  Surgeon: Daneil Dolin, MD;  Location: [facility];  Service: Endoscopy;  Laterality: N/A;  2:15pm   . ESOPHAGOGASTRODUODENOSCOPY  2006   Dr. Gala Romney: Subtle Schatzkis ring, otherwise normal upper GI tract, aside from a small pyloric channell erosion, status post dilation as described above.   Marland Kitchen KNEE SURGERY Bilateral   . POLYPECTOMY  06/24/2017   Procedure: POLYPECTOMY;  Surgeon: Daneil Dolin, MD;  Location: [facility];  Service: Endoscopy;;  ascending  . TONSILLECTOMY    . TOTAL KNEE ARTHROPLASTY Right 05/24/2013   DR MURPHY  . TOTAL KNEE ARTHROPLASTY Right 05/24/2013   Procedure: TOTAL KNEE ARTHROPLASTY;  Surgeon: [name], MD;  Location: [place];  Service: Orthopedics;  Laterality: Right;    Social History: Patient lives with her husband.  The patient walks independently.  Independent with ADLs prior to stroke.  Former smoker.   Allergies  Allergen Reactions  . Penicillins Hives, Itching and Rash    Has patient had a PCN reaction causing immediate rash, facial/tongue/throat swelling, SOB or lightheadedness with hypotension: Yes Has patient had a PCN reaction causing severe rash involving mucus membranes or skin necrosis: Yes Has patient had a PCN reaction that required hospitalization: No Has patient had a PCN reaction occurring within the last 10 years: No If all of the above answers are "NO", then may proceed with Cephalosporin use.   . Sulfa Antibiotics Swelling    Tongue swelling    Family history: family history includes Alcoholism in her other; Asthma in her other; Cancer in her other; Diabetes in her other; Heart disease in her other; Hypercholesterolemia in her other; Hypertension in her other; Rheumatologic disease in her other; Stroke in her other; Thyroid disease in her other.  Prior to Admission medications   Medication Sig Start Date End Date Taking? Authorizing Provider  acetaminophen (TYLENOL) 650 MG suppository Place 1 suppository (650 mg total) rectally every 6 (six) hours as needed for mild pain (or Fever >/= 101). 11/09/17   Angiulli, Lavon Paganini, PA-C    albuterol (PROVENTIL HFA;VENTOLIN HFA) 108 (90 Base) MCG/ACT inhaler Inhale 2 puffs into the lungs every 6 (six) hours as needed (for wheezing/shortness of breath).     [provider]  ALPRAZolam Duanne Moron) 1 MG tablet Take 1 mg by mouth at bedtime.    [provider]  apixaban (ELIQUIS) 5 MG TABS tablet Take 1 tablet (5 mg total) by mouth 2 (two) times daily. 09/06/17   Arnoldo Lenis, MD  atorvastatin (LIPITOR) 80 MG tablet Take 1 tablet (80 mg total) by mouth daily. 11/09/17   Angiulli, Lavon Paganini, PA-C  esomeprazole (NEXIUM) 40 MG capsule Take 40 mg by mouth daily as needed (heartburn). For heartburn    [provider]  KCl in Dextrose-NaCl (DEXTROSE 5 % AND 0.45 % NACL WITH KCL 10 MEQ/L) 10-5-0.45 MEQ/L-%-% Inject 75 mLs into the vein continuous. 11/09/17   Angiulli, Lavon Paganini, PA-C  Levofloxacin (LEVAQUIN) 250 MG/50ML SOLN Inject 50 mLs (250 mg total) into  the vein daily. 11/09/17   Angiulli, Lavon Paganini, PA-C  levothyroxine (LEVOTHROID) 50 MCG tablet Take 50 mcg by mouth daily before breakfast.    [provider]  metroNIDAZOLE (FLAGYL) 5-0.79 MG/ML-% IVPB Inject 100 mLs (500 mg total) into the vein every 8 (eight) hours. 11/09/17   Angiulli, Lavon Paganini, PA-C  omega-3 acid ethyl esters (LOVAZA) 1 g capsule Take 2 capsules (2 g total) by mouth daily. 11/09/17   Angiulli, Lavon Paganini, PA-C  OVER THE COUNTER MEDICATION Apply 1 application topically 3 (three) times daily as needed (for knee pain.). TWO OLD GOATS ESSENTIAL LOTION FOR ACHES & PAINS    [provider]  oxyCODONE (OXY IR/ROXICODONE) 5 MG immediate release tablet Take 1 tablet (5 mg total) by mouth every 4 (four) hours as needed for moderate pain or severe pain. 11/09/17   Angiulli, Lavon Paganini, PA-C  oxyCODONE-acetaminophen (ENDOCET) 10-325 MG tablet Take 1 tablet by mouth every 4 (four) hours as needed for pain.    [provider]  pantoprazole (PROTONIX) 40 MG tablet Take 1 tablet (40 mg total) by  mouth daily. 11/10/17   Angiulli, Lavon Paganini, PA-C  Polyethyl Glycol-Propyl Glycol (LUBRICANT EYE DROPS) 0.4-0.3 % SOLN Place 1 drop into both eyes 3 (three) times daily as needed (for dry eyes).    [provider]  polyethylene glycol (MIRALAX / GLYCOLAX) packet Take 17 g by mouth daily as needed for mild constipation. 11/01/17   Roxan Hockey, MD  potassium chloride SA (K-DUR,KLOR-CON) 20 MEQ tablet Take 20 mEq by mouth daily.    [provider]  traZODone (DESYREL) 100 MG tablet Take 1 tablet (100 mg total) by mouth at bedtime as needed for sleep. 11/01/17   Roxan Hockey, MD  Vitamin D, Ergocalciferol, (DRISDOL) 50000 UNITS CAPS Take 50,000 Units by mouth 2 (two) times a week. On Tuesdays and Thursdays    [provider]       Physical Exam: Temp 36.9C, BP 103/81, RR 18, SpO2 100% on RA, HR 100 General appearance: Well-developed, obese adult female, alert and in no acute distress.   Eyes: Anicteric, conjunctiva pink, lids and lashes normal. PERRL.    ENT: No nasal deformity, discharge, epistaxis.  Hearing normal. OP moist without lesions.   Neck: No neck masses.  Trachea midline.  No thyromegaly/tenderness. Skin: Warm and dry.  No suspicious rashes or lesions. Cardiac:Tachcyardic, regular, nl S1-S2, no murmurs appreciated.  Capillary refill is brisk.  JVP not visible.  No LE edema.  Radial pulses 2+ and symmetric. Respiratory: Tachpyneic, shallow.  No rales or wheezes. Abdomen: Abdomen distended, tympanitic, no rebound.  Voluntary guarding rpesent.  Diffusely tender, worse in LLQ.    MSK: No deformities or effusions.  No cyanosis or clubbing. Neuro:  Sensation intact to light touch. Speech is very dysarthric. Muscle strength diminished on right..    Psych: Sensorium intact and responding to questions, attention normal.  Behavior appropriate.  Affect normal.  Judgment and insight appear normal.     Labs on Admission:  I have personally reviewed following  labs and imaging studies: CBC: Recent Labs  Lab 11/08/17 1704 11/09/17 0642  WBC 19.9* 16.5*  HGB 14.6 13.9  HCT 45.4 43.9  MCV 93.4 93.8  PLT 389 093   Basic Metabolic Panel: Recent Labs  Lab 11/07/17 0629 11/08/17 0649 11/08/17 1647 11/09/17 0642  NA 142 142 140 142  K 2.8* 2.7* 3.6 3.1*  CL 108 110 107 112*  CO2 20* 21* 20* 19*  GLUCOSE 126* 128* 127* 113*  BUN 53* 57* 55* 57*  CREATININE 1.94* 2.17* 2.14* 2.09*  CALCIUM 9.2 8.9 8.8* 9.0  MG  --  2.5*  --   --    GFR: Estimated Creatinine Clearance: 26.7 mL/min (A) (by C-G formula based on SCr of 2.09 mg/dL (H)).  Liver Function Tests: No results for input(s): AST, ALT, ALKPHOS, BILITOT, PROT, ALBUMIN in the last 168 hours. No results for input(s): LIPASE, AMYLASE in the last 168 hours. No results for input(s): AMMONIA in the last 168 hours. Coagulation Profile: No results for input(s): INR, PROTIME in the last 168 hours. Cardiac Enzymes: No results for input(s): CKTOTAL, CKMB, CKMBINDEX, TROPONINI in the last 168 hours. BNP (last 3 results) No results for input(s): PROBNP in the last 8760 hours. HbA1C: No results for input(s): HGBA1C in the last 72 hours. CBG: No results for input(s): GLUCAP in the last 168 hours. Lipid Profile: No results for input(s): CHOL, HDL, LDLCALC, TRIG, CHOLHDL, LDLDIRECT in the last 72 hours. Thyroid Function Tests: No results for input(s): TSH, T4TOTAL, FREET4, T3FREE, THYROIDAB in the last 72 hours. Anemia Panel: No results for input(s): VITAMINB12, FOLATE, FERRITIN, TIBC, IRON, RETICCTPCT in the last 72 hours. Sepsis Labs:  Invalid input(s): PROCALCITONIN, LACTICACIDVEN Recent Results (from the past 240 hour(s))  Urine Culture     Status: Abnormal   Collection Time: 11/02/17 10:56 AM  Result Value Ref Range Status   Specimen Description URINE, CLEAN CATCH  Final   Special Requests   Final    NONE Performed at [facility], [place] [address], [place], [place]  [address]    Culture MULTIPLE SPECIES PRESENT, SUGGEST RECOLLECTION (A)  Final   Report Status 11/03/2017 FINAL  Final  Culture, Urine     Status: None   Collection Time: 11/04/17  3:57 AM  Result Value Ref Range Status   Specimen Description URINE, CATHETERIZED  Final   Special Requests NONE  Final   Culture   Final    NO GROWTH Performed at [facility], [place] [address] [place], [place] [address]    Report Status 11/05/2017 FINAL  Final  C difficile quick scan w PCR reflex     Status: None   Collection Time: 11/08/17  5:33 PM  Result Value Ref Range Status   C Diff antigen NEGATIVE NEGATIVE Final   C Diff toxin NEGATIVE NEGATIVE Final   C Diff interpretation No C. difficile detected.  Final    Comment: Performed at [facility], [place] [address] [place], [place] [address]         Radiological Exams on Admission: Personally reviewed CT report: Ct Abdomen Pelvis Wo Contrast  Result Date: 11/09/2017 CLINICAL DATA:  72 year old female with left lower quadrant pain, nausea, vomiting and constipation. Negative C difficile. Hypokalemia. Subsequent encounter. EXAM: CT ABDOMEN AND PELVIS WITHOUT CONTRAST TECHNIQUE: Multidetector CT imaging of the abdomen and pelvis was performed following the standard protocol without IV contrast. COMPARISON:  11/08/2017 plain film exam.  03/02/2011 CT. FINDINGS: Lower chest: Basilar subsegmental atelectasis. Minimal pulmonary vascular congestion. Heart size top-normal. Right coronary artery calcification. Hepatobiliary: Taking into account limitation by non contrast imaging, no worrisome hepatic lesion. Post cholecystectomy. Pancreas: 1.1 cm fatty lesion pancreatic tail possibly small lipoma. No evidence of pancreatic inflammation. Spleen: Taking into account limitation by non contrast imaging, no splenic mass or enlargement. Adrenals/Urinary Tract: No adrenal mass. No obstructing stone or hydronephrosis. 1.3 cm right renal cyst. Under distended urinary  bladder unremarkable. Stomach/Bowel: Gas and fluid-filled distended colon with the transverse colon measuring up to 13.3 cm. No distal obstructing lesion is noted. Fluid is seen throughout the distal sigmoid colon/rectum. Etiology of these findings indeterminate possibly related to ileus, metabolic abnormality (hypokalemia) or infection (although C difficile is negative). There is an area of questionable narrowing of the splenic flexure of the colon (series 6, image 61 series 7, image 95) and therefore mass not excluded although it is possible this represents peristalsis. No pneumatosis, free air or abnormal fluid collection. No extraluminal bowel inflammation. Appendix unremarkable. Stomach is incompletely distended although otherwise negative. No primary small bowel abnormality. No bowel containing hernia detected. Vascular/Lymphatic: Atherosclerotic plaque aorta, aortic branch vessel and iliac arteries. No aortic aneurysm. Prominent bend of the lower thoracic aorta/upper abdominal aorta. Scattered normal size lymph nodes. Reproductive: Post hysterectomy.  No worrisome adnexal mass. Other: Injection sites anterior abdominal wall. Musculoskeletal: Prior fusion L4-5 and L5-S1. Degenerative changes L3-4 and mid to lower thoracic spine. IMPRESSION: 1. Gas and fluid-filled distended colon with the transverse colon measuring up to 13.3 cm. No distal obstructing lesion is noted. Fluid is seen throughout the distal sigmoid colon/rectum. Etiology of these findings is indeterminate possibly related to ileus, metabolic abnormality (hypokalemia) or infection (although C difficile is negative). 2. There is an area of questionable narrowing of the splenic flexure of the colon (series 6, image 61 series 7, image 95) and therefore mass not excluded although it is possible this represents peristalsis. 3. No pneumatosis, free air or abnormal fluid collection. No extraluminal bowel inflammation. 4. 1.1 cm fatty lesion pancreas  possibly a lipoma. 5. Prior fusion L4-5 and L5-S1. Degenerative changes mid to lower thoracic spine and L3-4 level. 6.  Aortic Atherosclerosis (ICD10-I70.0). Electronically Signed   By: Genia Del M.D.   On: 11/09/2017 10:20   Dg Abd 1 View  Result Date: 11/08/2017 CLINICAL DATA:  Nausea, abdominal distention. EXAM: ABDOMEN - 1 VIEW COMPARISON:  None. FINDINGS: No small bowel dilatation is noted. Air-filled dilated colon is noted, with the largest diameter measured at 15.4 cm in transverse colon. Status post cholecystectomy. No abnormal calcifications are noted IMPRESSION: Air-filled dilated colon is noted measuring 15.4 cm in largest diameter. This is concerning for possible ileus. Electronically Signed   By: Marijo Conception, M.D.   On: 11/08/2017 13:22        Assessment/Plan  1. 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 -Stop antibiotics -Stop oxycodone and all opiates - Check magnesium -Aggressively supplement potassium - Scheduled daily MiraLAX, add enema - Consult gastroenterology regarding neostigmine versus mechanical decompression   2. Leukocytosis:  Likely reactive. -Stop antibiotics and monitor fever curve and WBCs  3.  Paroxysmal atrial fibrillation:  CHA2DS2-Vasc 6.  On eliquis.  Rates <110. -Continue Eliquis -Afib stable at present, if worsening HR noted, or if need for neostigmine, will need telemetry and continuous pulse ox  4.  Recent stroke:  -Continue atorvastatin, Eliquis  5.  Hypothyroidism:  -Continue levothyroxine  6. Other medicaitons:  -Continue PPI -Continue Xanax       DVT prophylaxis: N/A on Eliquis  Code Status: FULL  Family Communication: Family at bedside  Disposition Plan: At this time, the patient is unable to participate in rehab activities due to severe abdominal pain, dyspnea related to abdominal distension.  It is my judgment that inpatient services are reasonable and  expected for more than 2 midnights given the natural course of colonic  pseudo-obstruction, as well as the patient's: age, co-morbid atrial fibrillation, recent stroke, ongoing IV fluids, and the high likelihood of an adverse outcome, including readmission, debility or death if the patient were to be discharged prematurely.   Consults called: GI, [facility], Dr. Rosalie Gums Admission status: INAPTIENT    Medical decision making: Patient seen at 3:05 PM on 11/09/2017.  The patient was discussed with Marlowe Shores, PA-C and Dr. Alessandra Bevels.  What exists of the patient's chart was reviewed in depth and summarized above.  Clinical condition: stable.        Edwin Dada [facility] Pager [phone]

## 2017-11-09 NOTE — Progress Notes (Signed)
Large amount of loose watery stool were the only results from the Fleets enema this HS.

## 2017-11-09 NOTE — Progress Notes (Signed)
Speech Language Pathology Daily Session Note  Patient Details  Name: Holly Flores MRN: [id number] Date of Birth: [birth year]  Today's Date: 11/09/2017 SLP Individual Time:  -     Short Term Goals: Week 2: SLP Short Term Goal 1 (Week 2): Pt will utilizie speech intelligibiity strategies at sentence level with Mod A verbal cues to achieve 80% intelligibility. SLP Short Term Goal 2 (Week 2): Pt will demonstrate functional problem solving for mildly complex tasks with Mod A verbal cues. SLP Short Term Goal 3 (Week 2): Pt will self-monitor and self-correct functional errors with Mod A verbal cues. SLP Short Term Goal 4 (Week 2): Pt will demonstrate selective attention during functional tasks for 30 minutes with supervision cues for redirection. SLP Short Term Goal 5 (Week 2): Pt will consume regular texture trials without overt s/s aspiration with Mod I for use of swallowing compensatory strategies (oral clearance.) SLP Short Term Goal 6 (Week 2): Pt will demonstrate intellectual awareness by listing 2 physical and 2 cognitive-linguistic deficits with Mod A cues.   Skilled Therapeutic Interventions:Skilled ST services focused on speech skills. SLP facilitated intellectual awareness skills, given questions to identify cognitive and physical deficits, pt required mod A verbal cues, however denied initial voice and cogntigev changes. SLP facilitated intellectual awareness, calling voicemail to compare voice piror to CVA, pt noted changes in vocal intensity,  Flow of speech (coordination of phonation/respirtory.) SLP facilitated diaphragm exercises and cues pt to only speak two words per breath in communication barrier task, pt demonstrated 70% intelligibility in structured task and 50% intelligibility in unstructured tasks at phrase level. SLP facilitated basic to semi-complex problem solving task with money management, pt required mod A verbal cues. Pt was left in room with call bell within reach and  bed alaram set.ST reccomends to continue skilled ST services.     Function:  Eating Eating                 Cognition Comprehension Comprehension assist level: Follows basic conversation/direction with no assist  Expression   Expression assist level: Expresses basic 50 - 74% of the time/requires cueing 25 - 49% of the time. Needs to repeat parts of sentences.;Expresses basic 75 - 89% of the time/requires cueing 10 - 24% of the time. Needs helper to occlude trach/needs to repeat words.  Social Interaction Social Interaction assist level: Interacts appropriately 75 - 89% of the time - Needs redirection for appropriate language or to initiate interaction.  Problem Solving Problem solving assist level: Solves basic 50 - 74% of the time/requires cueing 25 - 49% of the time;Solves basic 75 - 89% of the time/requires cueing 10 - 24% of the time  Memory Memory assist level: Recognizes or recalls 50 - 74% of the time/requires cueing 25 - 49% of the time    Pain Pain Assessment Pain Scale: 0-10 Pain Score: 7  Pain Location: Abdomen Patients Stated Pain Goal: 5 Pain Intervention(s): Medication (See eMAR)  Therapy/Group: Individual Therapy  Tysen Roesler  [facility] 11/09/2017, 4:08 PM

## 2017-11-09 NOTE — Progress Notes (Signed)
Physical Therapy Weekly Progress Note  Patient Details  Name: Holly Flores MRN: [id number] Date of Birth: Oct 29, 1945  Beginning of progress report period: November 02, 2017 End of progress report period: November 09, 2017  Today's Date: 11/09/2017   Patient has met 1 of 3 short term goals.  Pt is making slow progress towards LTG's as she as been limited somewhat medically over the past few days. Pt currently requires stedy lift for transfers, min assist for w/c mobility, max assist for bed mobility, but has been able to ambulate 6 ft with rail in hallway & max assist. Pt would benefit from continued skilled PT treatment to focus on sitting/standing balance, transfers, bed mobility, w/c mobility, gait and for pt/family education prior to d/c.  Patient continues to demonstrate the following deficits muscle weakness, decreased cardiorespiratoy endurance, decreased coordination, decreased visual perceptual skills, decreased attention to right, decreased awareness, decreased problem solving, decreased safety awareness, decreased memory and delayed processing, and decreased sitting balance, decreased standing balance, decreased postural control, hemiplegia and decreased balance strategies and therefore will continue to benefit from skilled PT intervention to increase functional independence with mobility.  Patient progressing toward long term goals..  Continue plan of care.  PT Short Term Goals Week 1:  PT Short Term Goal 1 (Week 1): Pt will complete bed mobility with mod assist +1. PT Short Term Goal 1 - Progress (Week 1): Not met PT Short Term Goal 2 (Week 1): Pt will consistently complete bed<>w/c transfers with max assist +1. PT Short Term Goal 2 - Progress (Week 1): Not met PT Short Term Goal 3 (Week 1): Pt will propel w/c 30 ft with min assist. PT Short Term Goal 3 - Progress (Week 1): Met Week 2:  PT Short Term Goal 1 (Week 2): Pt will propel w/c 50 ft with supervision.  PT Short Term  Goal 2 (Week 2): Pt will consistently complete bed mobility with hospital bed features and mod assist.  PT Short Term Goal 3 (Week 2): Pt will complete bed<>w/c transfers with max assist +1.   Therapy Documentation Precautions:  Precautions Precautions: Fall Precaution Comments: R hemi, R inattention Restrictions Weight Bearing Restrictions: No     See Function Navigator for Current Functional Status.  Therapy/Group: Individual Therapy  Waunita Schooner 11/09/2017, 9:24 AM

## 2017-11-09 NOTE — Progress Notes (Signed)
Holly Flores [id number] Admission Data: 11/09/2017 6:03 PM Attending Provider: Edwin Dada, *  RJJ:OACZYS, Holly Griffins, DO Consults/ Treatment Team:   Holly Flores is a 72 y.o. female patient admitted from ED awake, alert  & orientated  X 3,  Full Code, VSS - Height 5\' 1"  (1.549 m), weight 95.9 kg., no c/o shortness of breath, no c/o chest pain, no distress noted. Tele # 17 placed and pt is currently running:normal sinus rhythm.   IV site WDL:  antecubital left, condition patent and no redness with a transparent dsg that's clean dry and intact.  Allergies:   Allergies  Allergen Reactions  . Penicillins Hives, Itching and Rash    Has patient had a PCN reaction causing immediate rash, facial/tongue/throat swelling, SOB or lightheadedness with hypotension: Yes Has patient had a PCN reaction causing severe rash involving mucus membranes or skin necrosis: Yes Has patient had a PCN reaction that required hospitalization: No Has patient had a PCN reaction occurring within the last 10 years: No If all of the above answers are "NO", then may proceed with Cephalosporin use.   . Sulfa Antibiotics Swelling    Tongue swelling     Past Medical History:  Diagnosis Date  . Anxiety   . Arthritis   . CHF (congestive heart failure) ([place])   . COPD (chronic obstructive pulmonary disease) ([place])   . Depression   . GERD (gastroesophageal reflux disease)   . Goiter   . Hyperlipidemia   . Hypertension   . Hypothyroidism   . Kidney cysts    left side  . Myocardial infarction ([place])    8 yrs ago.   Marland Kitchen Shortness of breath   . Sleep apnea    uses CPAP, 3  . Stroke ([place])   . 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   Pt orientation to unit, room and routine. Information packet given to patient/family and safety video watched.  Admission INP armband ID verified with patient/family, and in place. fall risk assessment complete with Patient and family verbalizing understanding of risks  associated with falls. Pt verbalizes an understanding of how to use the call bell and to call for help before getting out of bed.  Skin, clean-dry- intact without evidence of bruising, or skin tears.   No evidence of skin break down noted on exam. no rashes, no ecchymoses    Will cont to monitor and assist as needed.  Evvie Behrmann Margaretha Sheffield, RN 11/09/2017 6:03 PM

## 2017-11-09 NOTE — Progress Notes (Signed)
Occupational Therapy Session Note  Patient Details  Name: Holly Flores MRN: [id number] Date of Birth: 04/17/1945  Today's Date: 11/09/2017 OT Individual Time: 8882-8003 OT Individual Time Calculation (min): 30 min  and Today's Date: 11/09/2017 OT Missed Time: 30 Minutes Missed Time Reason: Patient unwilling/refused to participate without medical reason   Short Term Goals: Week 1:  OT Short Term Goal 1 (Week 1): Pt will complete bathing with max assist of one caregiver at sit > stand level OT Short Term Goal 2 (Week 1): Pt will complete LB dressing with max assist of one caregiver at sit > stand level OT Short Term Goal 3 (Week 1): Pt will complete toilet transfer with max assist of one caregiver OT Short Term Goal 4 (Week 1): Pt will complete UB dressing with mod assist  Skilled Therapeutic Interventions/Progress Updates:    Treatment session with focus on activity tolerance, participation, and education on RUE positioning and ROM.  Pt received supine in bed with IV running, pt reporting not wanting to do any self-care tasks or get OOB.  Able to convince pt to focus on RUE ROM.  Multiple family members present, therefore educated on PROM for shoulder flexion and external rotation.  Educated on proper placement and not exceeding > 90* shoulder flexion.  Encouraged family members to complete ROM multiple times/day as pt allows.  Educated pt on self-ROM, however due to difficulty with IV pt not using LUE while IV running.  PA Linna Hoff) arrived to answer family questions regarding multiple medical issues, therefore therapist missed 30 mins therapy.  Will continue to follow per POC.  Therapy Documentation Precautions:  Precautions Precautions: Fall Precaution Comments: R hemi, R inattention Restrictions Weight Bearing Restrictions: No General: General OT Amount of Missed Time: 30 Minutes  Pain:  Pt with reports of pain in LLQ of abdomen.  RN aware.  See Function Navigator for Current  Functional Status.   Therapy/Group: Individual Therapy  [name] 11/09/2017, 12:17 PM

## 2017-11-09 NOTE — Progress Notes (Signed)
Subjective/Complaints:  Daughter at bedside discussed abd distention and electrolyte disturbance Pt has mild abd discomfort mainly LLQ but is diffuse Vomited after eating carrots 2 d ago but not since.  Per daughter BMs have been loose since CVA  ROS-  Neg CP, SOB, N/V/D  Objective: Vital Signs: Blood pressure 107/71, pulse 85, temperature 98.3 F (36.8 C), temperature source Oral, resp. rate 18, SpO2 96 %. Dg Abd 1 View  Result Date: 11/08/2017 CLINICAL DATA:  Nausea, abdominal distention. EXAM: ABDOMEN - 1 VIEW COMPARISON:  None. FINDINGS: No small bowel dilatation is noted. Air-filled dilated colon is noted, with the largest diameter measured at 15.4 cm in transverse colon. Status post cholecystectomy. No abnormal calcifications are noted IMPRESSION: Air-filled dilated colon is noted measuring 15.4 cm in largest diameter. This is concerning for possible ileus. Electronically Signed   By: Marijo Conception, M.D.   On: 11/08/2017 13:22   Results for orders placed or performed during the hospital encounter of 11/01/17 (from the past 72 hour(s))  Basic metabolic panel     Status: Abnormal   Collection Time: 11/07/17  6:29 AM  Result Value Ref Range   Sodium 142 135 - 145 mmol/L   Potassium 2.8 (L) 3.5 - 5.1 mmol/L   Chloride 108 98 - 111 mmol/L   CO2 20 (L) 22 - 32 mmol/L   Glucose, Bld 126 (H) 70 - 99 mg/dL   BUN 53 (H) 8 - 23 mg/dL   Creatinine, Ser 1.94 (H) 0.44 - 1.00 mg/dL   Calcium 9.2 8.9 - 10.3 mg/dL   GFR calc non Af Amer 25 (L) >60 mL/min   GFR calc Af Amer 29 (L) >60 mL/min    Comment: (NOTE) The eGFR has been calculated using the CKD EPI equation. This calculation has not been validated in all clinical situations. eGFR's persistently <60 mL/min signify possible Chronic Kidney Disease.    Anion gap 14 5 - 15    Comment: Performed at [facility] [address] [place], [place] [address]  Basic metabolic panel     Status: Abnormal   Collection Time: 11/08/17   6:49 AM  Result Value Ref Range   Sodium 142 135 - 145 mmol/L   Potassium 2.7 (LL) 3.5 - 5.1 mmol/L    Comment: CRITICAL RESULT CALLED TO, READ BACK BY AND VERIFIED WITH: E.GRECU,RN [id number] 11/08/17 CLARK,S    Chloride 110 98 - 111 mmol/L   CO2 21 (L) 22 - 32 mmol/L   Glucose, Bld 128 (H) 70 - 99 mg/dL   BUN 57 (H) 8 - 23 mg/dL   Creatinine, Ser 2.17 (H) 0.44 - 1.00 mg/dL   Calcium 8.9 8.9 - 10.3 mg/dL   GFR calc non Af Amer 22 (L) >60 mL/min   GFR calc Af Amer 25 (L) >60 mL/min    Comment: (NOTE) The eGFR has been calculated using the CKD EPI equation. This calculation has not been validated in all clinical situations. eGFR's persistently <60 mL/min signify possible Chronic Kidney Disease.    Anion gap 11 5 - 15    Comment: Performed at [facility] [address] [place], [place] [address]  Magnesium     Status: Abnormal   Collection Time: 11/08/17  6:49 AM  Result Value Ref Range   Magnesium 2.5 (H) 1.7 - 2.4 mg/dL    Comment: Performed at [facility] [address], [place], [place] [address]  Basic metabolic panel  Status: Abnormal   Collection Time: 11/08/17  4:47 PM  Result Value Ref Range   Sodium 140 135 - 145 mmol/L   Potassium 3.6 3.5 - 5.1 mmol/L    Comment: DELTA CHECK NOTED   Chloride 107 98 - 111 mmol/L   CO2 20 (L) 22 - 32 mmol/L   Glucose, Bld 127 (H) 70 - 99 mg/dL   BUN 55 (H) 8 - 23 mg/dL   Creatinine, Ser 2.14 (H) 0.44 - 1.00 mg/dL   Calcium 8.8 (L) 8.9 - 10.3 mg/dL   GFR calc non Af Amer 22 (L) >60 mL/min   GFR calc Af Amer 26 (L) >60 mL/min    Comment: (NOTE) The eGFR has been calculated using the CKD EPI equation. This calculation has not been validated in all clinical situations. eGFR's persistently <60 mL/min signify possible Chronic Kidney Disease.    Anion gap 13 5 - 15    Comment: Performed at [facility] [address], [place] [address]  CBC     Status: Abnormal   Collection Time: 11/08/17  5:04 PM   Result Value Ref Range   WBC 19.9 (H) 4.0 - 10.5 K/uL   RBC 4.86 3.87 - 5.11 MIL/uL   Hemoglobin 14.6 12.0 - 15.0 g/dL   HCT 45.4 36.0 - 46.0 %   MCV 93.4 78.0 - 100.0 fL   MCH 30.0 26.0 - 34.0 pg   MCHC 32.2 30.0 - 36.0 g/dL   RDW 13.8 11.5 - 15.5 %   Platelets 389 150 - 400 K/uL    Comment: Performed at [facility] [address], [place], [place] [address]  C difficile quick scan w PCR reflex     Status: None   Collection Time: 11/08/17  5:33 PM  Result Value Ref Range   C Diff antigen NEGATIVE NEGATIVE   C Diff toxin NEGATIVE NEGATIVE   C Diff interpretation No C. difficile detected.     Comment: Performed at [facility], [place] [address], [place], [place] [address]  Basic metabolic panel     Status: Abnormal   Collection Time: 11/09/17  6:42 AM  Result Value Ref Range   Sodium 142 135 - 145 mmol/L   Potassium 3.1 (L) 3.5 - 5.1 mmol/L   Chloride 112 (H) 98 - 111 mmol/L   CO2 19 (L) 22 - 32 mmol/L   Glucose, Bld 113 (H) 70 - 99 mg/dL   BUN 57 (H) 8 - 23 mg/dL   Creatinine, Ser 2.09 (H) 0.44 - 1.00 mg/dL   Calcium 9.0 8.9 - 10.3 mg/dL   GFR calc non Af Amer 23 (L) >60 mL/min   GFR calc Af Amer 26 (L) >60 mL/min    Comment: (NOTE) The eGFR has been calculated using the CKD EPI equation. This calculation has not been validated in all clinical situations. eGFR's persistently <60 mL/min signify possible Chronic Kidney Disease.    Anion gap 11 5 - 15    Comment: Performed at [facility] [address], [place], [place] [address] [address]  CBC     Status: Abnormal   Collection Time: 11/09/17  6:42 AM  Result Value Ref Range   WBC 16.5 (H) 4.0 - 10.5 K/uL   RBC 4.68 3.87 - 5.11 MIL/uL   Hemoglobin 13.9 12.0 - 15.0 g/dL   HCT 43.9 36.0 - 46.0 %   MCV 93.8 78.0 - 100.0 fL   MCH 29.7 26.0 - 34.0 pg  MCHC 31.7 30.0 - 36.0 g/dL   RDW 13.9 11.5 - 15.5 %   Platelets 352 150 - 400 K/uL    Comment: Performed at [facility], [place] [address] [place],  [place] [address]     HEENT: normal Cardio: RRR and no murmur Resp: CTA B/L and Unlabored GI: BS positive and NT, ND Extremity:  Pulses positive and No Edema Skin:   Intact Neuro: Alert/Oriented, Normal Sensory, Abnormal Motor 0/5 on RIght side and Dysarthric Musc/Skel:  Other no pain in UE or LE Gen NAD   Assessment/Plan: 1. 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 Physiatrist is providing close team supervision and 24 hour management of active medical problems listed below. Physiatrist and rehab team continue to assess barriers to discharge/monitor patient progress toward functional and medical goals. FIM: Function - Bathing Position: Bed(LB at bed level, UB at sink) Body parts bathed by patient: Chest, Abdomen, Right upper leg, Left upper leg, Right arm Body parts bathed by helper: Left arm, Front perineal area, Buttocks, Right lower leg, Left lower leg, Back Assist Level: (Mod assist)  Function- Upper Body Dressing/Undressing What is the patient wearing?: Button up shirt Pull over shirt/dress - Perfomed by patient: Thread/unthread left sleeve Pull over shirt/dress - Perfomed by helper: Thread/unthread right sleeve, Put head through opening, Pull shirt over trunk Button up shirt - Perfomed by patient: Thread/unthread left sleeve Button up shirt - Perfomed by helper: Thread/unthread right sleeve, Button/unbutton shirt, Pull shirt around back Assist Level: (Max assist) Function - Lower Body Dressing/Undressing What is the patient wearing?: Pants, Non-skid slipper socks Position: Bed Pants- Performed by helper: Thread/unthread right pants leg, Thread/unthread left pants leg, Pull pants up/down Non-skid slipper socks- Performed by helper: Don/doff right sock, Don/doff left sock Assist for footwear: Maximal assist Assist for lower body dressing: (Total  assist)  Function - Toileting Toileting steps completed by helper: Adjust clothing prior to toileting, Performs perineal hygiene, Adjust clothing after toileting Toileting Assistive Devices: Other (comment)(bedpan) Assist level: Two helpers  Function - Air cabin crew transfer activity did not occur: Safety/medical concerns Toilet transfer assistive device: Facilities manager lift: Stedy Assist level to toilet: Moderate assist (Pt 50 - 74%/lift or lower) Assist level from toilet: Moderate assist (Pt 50 - 74%/lift or lower) Assist level to bedside commode (at bedside): Dependent (Pt equals 0%) Assist level from bedside commode (at bedside): Dependent (Pt equals 0%)  Function - Chair/bed transfer Chair/bed transfer activity did not occur: Safety/medical concerns Chair/bed transfer method: Other Chair/bed transfer assist level: Touching or steadying assistance (Pt > 75%) Chair/bed transfer assistive device: Mechanical lift Mechanical lift: Stedy Chair/bed transfer details: Manual facilitation for weight shifting, Verbal cues for technique, Manual facilitation for placement  Function - Locomotion: Wheelchair Will patient use wheelchair at discharge?: Yes Type: Manual Wheelchair activity did not occur: Safety/medical concerns Max wheelchair distance: 50 Assist Level: Touching or steadying assistance (Pt > 75%) Wheel 50 feet with 2 turns activity did not occur: Safety/medical concerns Assist Level: Touching or steadying assistance (Pt > 75%) Wheel 150 feet activity did not occur: Safety/medical concerns Turns around,maneuvers to table,bed, and toilet,negotiates 3% grade,maneuvers on rugs and over doorsills: No Function - Locomotion: Ambulation Ambulation activity did not occur: Safety/medical concerns Assistive device: Rail in hallway Max distance: 38f  Assist level: Maximal assist (Pt 25 - 49%) Walk 10 feet activity did not occur: Safety/medical  concerns Walk 50  feet with 2 turns activity did not occur: Safety/medical concerns Walk 150 feet activity did not occur: Safety/medical concerns Walk 10 feet on uneven surfaces activity did not occur: Safety/medical concerns  Function - Comprehension Comprehension: Auditory Comprehension assist level: Follows basic conversation/direction with no assist  Function - Expression Expression: Verbal Expression assist level: Expresses basic 50 - 74% of the time/requires cueing 25 - 49% of the time. Needs to repeat parts of sentences.  Function - Social Interaction Social Interaction assist level: Interacts appropriately 75 - 89% of the time - Needs redirection for appropriate language or to initiate interaction.  Function - Problem Solving Problem solving assist level: Solves basic 75 - 89% of the time/requires cueing 10 - 24% of the time  Function - Memory Memory assist level: Recognizes or recalls 50 - 74% of the time/requires cueing 25 - 49% of the time Patient normally able to recall (first 3 days only): That he or she is in a hospital, Staff names and faces, Location of own room, Current season  Medical Problem List and Plan: 1.  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 Cont CIR PT,  OT, SLP- Bedside due to ileus- may get OOB to chair Will need IM input, pt's participation may be limited for several days, await CT results, consider transfer to acute care 2.  DVT Prophylaxis/Anticoagulation: Eliquis resumed 3. Pain Management: Topamax 100 mg twice daily, oxycodone as needed 4. Mood: Xanax 1 mg nightly 5. Neuropsych: This patient is capable of making decisions on her own behalf. 6. Skin/Wound Care: Routine skin checks 7. Fluids/Electrolytes/Nutrition: Routine in and outs with follow-up chemistries, intake 360 mL on 11/04/2017, serum Mg++ normal, K+ improved  but still low this am  IM to address  8.  Diastolic congestive heart failure.off   Demadex 20 mg daily.  Monitor for any signs of fluid overload- fluid management per IM 9.  PAF.  Eliquis resumed.  Cardiac rate controlled 10.  Hypertension.  Permissive hypertension.  Monitor with increased mobility Vitals:   11/08/17 2037 11/09/17 0329  BP: 97/76 107/71  Pulse: 85 85  Resp: 18 18  Temp: 98.1 F (36.7 C) 98.3 F (36.8 C)  SpO2: 99% 96%  Controlled 9/23   11.  COPD.  Check oxygen saturations every shift.  Continue nebulizers- no wheezing 12.  CKD stage II.  Follow-up chemistries 13.  Hypothyroidism.  TSH 1.882. Synthroid 14.  Hyperlipidemia.  Lipitor/Lovaza 15.  GERD.  Protonix 16.  Constipation.  Laxative assistance, had cont liquid stool 17.  Leukocytosis - IM to follow C diff is neg  Discussion with pt and daughter at bedside LOS (Days) 8 A FACE TO FACE EVALUATION WAS PERFORMED  Charlett Blake 11/09/2017, 8:29 AM

## 2017-11-09 NOTE — Progress Notes (Signed)
Physical Therapy Note  Patient Details  Name: Holly Flores MRN: [id number] Date of Birth: Jan 04, 1946 Today's Date: 11/09/2017    Per verbal MD orders pt able to participate in beside therapy on this date. Pt received in bed appearing to feel unwell & pt declining participation in therapy at this time. Pt reports LLQ pain & RN aware. RN reports they are awaiting CT results. Will f/u per POC.   Waunita Schooner 11/09/2017, 9:27 AM

## 2017-11-09 NOTE — Consult Note (Signed)
Referring Provider:  [facility] Primary Care Physician:  Octavio Graves, DO Primary Gastroenterologist: Althia Forts / Dr. 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  GI is consulted for further evaluation.  Patient seen and examined at bedside.  Husband at bedside.  She is complaining of abdominal distention for last 2 to 4 days.  Had few episodes of nausea and nonbloody vomiting.  She is having bowel movement with small caliber stools.  Denies any blood in the stool.  Underwent surveillance colonoscopy on Jun 29, 2017 which showed small tubular adenoma ascending colon otherwise normal.  Also had colonoscopy in April 2016 which showed multiple tubular adenomas as well.  Past Medical History:  Diagnosis Date  . Anxiety   . Arthritis   . CHF (congestive heart failure) ([place])   . COPD (chronic obstructive pulmonary disease) ([place])   . Depression   . GERD (gastroesophageal reflux disease)   . Goiter   . Hyperlipidemia   . Hypertension   . Hypothyroidism   . Kidney cysts    left side  . Myocardial infarction ([place])    8 yrs ago.   Marland Kitchen Shortness of breath   . Sleep apnea    uses CPAP, 3  . Stroke ([place])   . Stroke ([place])    OCULAR  LEFT  EYE    LAST YR.    Past Surgical History:  Procedure Laterality Date  . ABDOMINAL HYSTERECTOMY    . BACK SURGERY     x3,cervical and lunmbar, disc.  Marland Kitchen BREAST BIOPSY     left-non cancerous  . CARDIAC CATHETERIZATION    . CARPAL TUNNEL RELEASE Left   . CATARACT EXTRACTION W/PHACO  09/21/2011   Procedure: CATARACT EXTRACTION PHACO AND INTRAOCULAR LENS PLACEMENT (IOC);  Surgeon: Williams Che, MD;  Location: [facility];  Service: Ophthalmology;   Laterality: Left;  CDE:9.78  . CATARACT EXTRACTION W/PHACO Right 10/15/2014   Procedure: CATARACT EXTRACTION PHACO AND INTRAOCULAR LENS PLACEMENT ([place]);  Surgeon: Williams Che, MD;  Location: [facility];  Service: Ophthalmology;  Laterality: Right;  CDE:4.19  . CERVICAL SPINE SURGERY    . CESAREAN SECTION     x2  . CHOLECYSTECTOMY    . COLONOSCOPY  2006   RMR: 1. Internal hemorroids, otherwise normal rectum. 2. Pedunculated polyp at 35 cm. reomved with snare. The remainder of teh colonic mucosa appeared normal.   . COLONOSCOPY  2011   Dr. Gala Romney: multiple ascending colon polyps and rectal polyp, adenomatous  . COLONOSCOPY N/A 05/31/2014   Procedure: COLONOSCOPY;  Surgeon: Daneil Dolin, MD;  Location: [facility];  Service: Endoscopy;  Laterality: N/A;  130  . COLONOSCOPY WITH PROPOFOL N/A 06/24/2017   Procedure: COLONOSCOPY WITH PROPOFOL;  Surgeon: Daneil Dolin, MD;  Location: [facility];  Service: Endoscopy;  Laterality: N/A;  2:15pm  . ESOPHAGOGASTRODUODENOSCOPY  2006   Dr. Gala Romney: Subtle Schatzkis ring, otherwise normal upper GI tract, aside from a small pyloric channell erosion, status post dilation as described above.   Marland Kitchen KNEE SURGERY Bilateral   . POLYPECTOMY  06/24/2017   Procedure: POLYPECTOMY;  Surgeon: Daneil Dolin, MD;  Location: [facility];  Service: Endoscopy;;  ascending  . TONSILLECTOMY    .  TOTAL KNEE ARTHROPLASTY Right 05/24/2013   DR MURPHY  . TOTAL KNEE ARTHROPLASTY Right 05/24/2013   Procedure: TOTAL KNEE ARTHROPLASTY;  Surgeon: [name], MD;  Location: [place];  Service: Orthopedics;  Laterality: Right;    Prior to Admission medications   Medication Sig Start Date End Date Taking? Authorizing Provider  acetaminophen (TYLENOL) 650 MG suppository Place 1 suppository (650 mg total) rectally every 6 (six) hours as needed for mild pain (or Fever >/= 101). 11/09/17   Angiulli, Lavon Paganini, PA-C  albuterol (PROVENTIL HFA;VENTOLIN HFA) 108 (90 Base) MCG/ACT inhaler  Inhale 2 puffs into the lungs every 6 (six) hours as needed (for wheezing/shortness of breath).     [provider]  ALPRAZolam Duanne Moron) 1 MG tablet Take 1 mg by mouth at bedtime.    [provider]  apixaban (ELIQUIS) 5 MG TABS tablet Take 1 tablet (5 mg total) by mouth 2 (two) times daily. 09/06/17   Arnoldo Lenis, MD  atorvastatin (LIPITOR) 80 MG tablet Take 1 tablet (80 mg total) by mouth daily. 11/09/17   Angiulli, Lavon Paganini, PA-C  KCl in Dextrose-NaCl (DEXTROSE 5 % AND 0.45 % NACL WITH KCL 10 MEQ/L) 10-5-0.45 MEQ/L-%-% Inject 75 mLs into the vein continuous. 11/09/17   Angiulli, Lavon Paganini, PA-C  Levofloxacin (LEVAQUIN) 250 MG/50ML SOLN Inject 50 mLs (250 mg total) into the vein daily. 11/09/17   Angiulli, Lavon Paganini, PA-C  levothyroxine (LEVOTHROID) 50 MCG tablet Take 50 mcg by mouth daily before breakfast.    [provider]  metroNIDAZOLE (FLAGYL) 5-0.79 MG/ML-% IVPB Inject 100 mLs (500 mg total) into the vein every 8 (eight) hours. 11/09/17   Angiulli, Lavon Paganini, PA-C  omega-3 acid ethyl esters (LOVAZA) 1 g capsule Take 2 capsules (2 g total) by mouth daily. 11/09/17   Angiulli, Lavon Paganini, PA-C  oxyCODONE (OXY IR/ROXICODONE) 5 MG immediate release tablet Take 1 tablet (5 mg total) by mouth every 4 (four) hours as needed for moderate pain or severe pain. 11/09/17   Angiulli, Lavon Paganini, PA-C  pantoprazole (PROTONIX) 40 MG tablet Take 1 tablet (40 mg total) by mouth daily. 11/10/17   Angiulli, Lavon Paganini, PA-C  polyethylene glycol (MIRALAX / GLYCOLAX) packet Take 17 g by mouth daily as needed for mild constipation. 11/01/17   Roxan Hockey, MD  potassium chloride SA (K-DUR,KLOR-CON) 20 MEQ tablet Take 20 mEq by mouth daily.    [provider]    Scheduled Meds: . ALPRAZolam  1 mg Oral QHS  . apixaban  5 mg Oral BID  . atorvastatin  80 mg Oral Daily  . [START ON 11/10/2017] levothyroxine  50 mcg Oral QAC breakfast  . [START ON 11/10/2017] pantoprazole  40 mg Oral Daily   . polyethylene glycol  17 g Oral BID  . sodium phosphate  1 enema Rectal Once   Continuous Infusions: . dextrose 5 % and 0.45 % NaCl with KCl 40 mEq/L     PRN Meds:.acetaminophen **OR** acetaminophen  Allergies as of 11/09/2017 - Review Complete 11/09/2017  Allergen Reaction Noted  . Penicillins Hives, Itching, and Rash 08/12/2011  . Sulfa antibiotics Swelling 08/12/2011    Family History  Problem Relation Age of Onset  . Cancer Other   . Diabetes Other   . Heart disease Other   . Hypertension Other   . Asthma Other   . Alcoholism Other   . Thyroid disease Other   . Rheumatologic disease Other   . Stroke Other   . Hypercholesterolemia  Other   . Colon cancer Neg Hx     Social History   Socioeconomic History  . Marital status: Married    Spouse name: Not on file  . Number of children: Not on file  . Years of education: Not on file  . Highest education level: Not on file  Occupational History  . Not on file  Social Needs  . Financial resource strain: Not on file  . Food insecurity:    Worry: Not on file    Inability: Not on file  . Transportation needs:    Medical: Not on file    Non-medical: Not on file  Tobacco Use  . Smoking status: Former Smoker    Packs/day: 0.50    Years: 10.00    Pack years: 5.00    Types: Cigarettes    Last attempt to quit: 04/21/1987    Years since quitting: 30.5  . Smokeless tobacco: Never Used  . Tobacco comment: smokes cigarette if she has a headache which helps.  Substance and Sexual Activity  . Alcohol use: Not Currently    Comment: previously 2 glasses of wine a day  . Drug use: No  . Sexual activity: Never    Birth control/protection: Post-menopausal, Surgical  Lifestyle  . Physical activity:    Days per week: Not on file    Minutes per session: Not on file  . Stress: Not on file  Relationships  . Social connections:    Talks on phone: Not on file    Gets together: Not on file    Attends religious service: Not on  file    Active member of club or organization: Not on file    Attends meetings of clubs or organizations: Not on file    Relationship status: Not on file  . Intimate partner violence:    Fear of current or ex partner: Not on file    Emotionally abused: Not on file    Physically abused: Not on file    Forced sexual activity: Not on file  Other Topics Concern  . Not on file  Social History Narrative   12th grade education. Lives with husband.     Review of Systems: Review of Systems  Constitutional: Negative for chills and fever.  HENT: Negative for hearing loss and tinnitus.   Respiratory: Negative for cough and hemoptysis.   Cardiovascular: Negative for chest pain and palpitations.  Gastrointestinal: Positive for abdominal pain, constipation, heartburn, nausea and vomiting. Negative for blood in stool and melena.  Genitourinary: Negative for dysuria and urgency.  Skin: Negative for itching and rash.  Neurological: Positive for speech change and focal weakness.  Psychiatric/Behavioral: Negative for hallucinations and suicidal ideas.    Physical Exam: Vital signs: reviewed - normal.   Last BM Date: 11/08/17 Physical Exam  Constitutional: She is oriented to person, place, and time. She appears well-developed and well-nourished. No distress.  HENT:  Head: Normocephalic and atraumatic.  Cardiovascular: Normal rate.  Irregular rhythm,  Pulmonary/Chest: [place] normal and breath sounds normal. No respiratory distress.  Abdominal: She exhibits distension. There is tenderness. There is no rebound and no guarding.  Epigastric distention and minimal tenderness, hypoactive bowel sounds  Neurological: She is alert and oriented to person, place, and time.  Skin: Skin is warm. No erythema.  Psychiatric: She has a normal mood and affect. Judgment normal.      GI:  Lab Results: Recent Labs    11/08/17 1704 11/09/17 0642  WBC 19.9* 16.5*  HGB  14.6 13.9  HCT 45.4 43.9  PLT 389 352    BMET Recent Labs    11/08/17 0649 11/08/17 1647 11/09/17 0642  NA 142 140 142  K 2.7* 3.6 3.1*  CL 110 107 112*  CO2 21* 20* 19*  GLUCOSE 128* 127* 113*  BUN 57* 55* 57*  CREATININE 2.17* 2.14* 2.09*  CALCIUM 8.9 8.8* 9.0   LFT No results for input(s): PROT, ALBUMIN, AST, ALT, ALKPHOS, BILITOT, BILIDIR, IBILI in the last 72 hours. PT/INR No results for input(s): LABPROT, INR in the last 72 hours.   Studies/Results: Ct Abdomen Pelvis Wo Contrast  Result Date: 11/09/2017 CLINICAL DATA:  72 year old female with left lower quadrant pain, nausea, vomiting and constipation. Negative C difficile. Hypokalemia. Subsequent encounter. EXAM: CT ABDOMEN AND PELVIS WITHOUT CONTRAST TECHNIQUE: Multidetector CT imaging of the abdomen and pelvis was performed following the standard protocol without IV contrast. COMPARISON:  11/08/2017 plain film exam.  03/02/2011 CT. FINDINGS: Lower chest: Basilar subsegmental atelectasis. Minimal pulmonary vascular congestion. Heart size top-normal. Right coronary artery calcification. Hepatobiliary: Taking into account limitation by non contrast imaging, no worrisome hepatic lesion. Post cholecystectomy. Pancreas: 1.1 cm fatty lesion pancreatic tail possibly small lipoma. No evidence of pancreatic inflammation. Spleen: Taking into account limitation by non contrast imaging, no splenic mass or enlargement. Adrenals/Urinary Tract: No adrenal mass. No obstructing stone or hydronephrosis. 1.3 cm right renal cyst. Under distended urinary bladder unremarkable. Stomach/Bowel: Gas and fluid-filled distended colon with the transverse colon measuring up to 13.3 cm. No distal obstructing lesion is noted. Fluid is seen throughout the distal sigmoid colon/rectum. Etiology of these findings indeterminate possibly related to ileus, metabolic abnormality (hypokalemia) or infection (although C difficile is negative). There is an area of questionable narrowing of the splenic flexure of  the colon (series 6, image 61 series 7, image 95) and therefore mass not excluded although it is possible this represents peristalsis. No pneumatosis, free air or abnormal fluid collection. No extraluminal bowel inflammation. Appendix unremarkable. Stomach is incompletely distended although otherwise negative. No primary small bowel abnormality. No bowel containing hernia detected. Vascular/Lymphatic: Atherosclerotic plaque aorta, aortic branch vessel and iliac arteries. No aortic aneurysm. Prominent bend of the lower thoracic aorta/upper abdominal aorta. Scattered normal size lymph nodes. Reproductive: Post hysterectomy.  No worrisome adnexal mass. Other: Injection sites anterior abdominal wall. Musculoskeletal: Prior fusion L4-5 and L5-S1. Degenerative changes L3-4 and mid to lower thoracic spine. IMPRESSION: 1. Gas and fluid-filled distended colon with the transverse colon measuring up to 13.3 cm. No distal obstructing lesion is noted. Fluid is seen throughout the distal sigmoid colon/rectum. Etiology of these findings is indeterminate possibly related to ileus, metabolic abnormality (hypokalemia) or infection (although C difficile is negative). 2. There is an area of questionable narrowing of the splenic flexure of the colon (series 6, image 61 series 7, image 95) and therefore mass not excluded although it is possible this represents peristalsis. 3. No pneumatosis, free air or abnormal fluid collection. No extraluminal bowel inflammation. 4. 1.1 cm fatty lesion pancreas possibly a lipoma. 5. Prior fusion L4-5 and L5-S1. Degenerative changes mid to lower thoracic spine and L3-4 level. 6.  Aortic Atherosclerosis (ICD10-I70.0). Electronically Signed   By: Genia Del M.D.   On: 11/09/2017 10:20   Dg Abd 1 View  Result Date: 11/08/2017 CLINICAL DATA:  Nausea, abdominal distention. EXAM: ABDOMEN - 1 VIEW COMPARISON:  None. FINDINGS: No small bowel dilatation is noted. Air-filled dilated colon is noted, with  the largest diameter measured at 15.4  cm in transverse colon. Status post cholecystectomy. No abnormal calcifications are noted IMPRESSION: Air-filled dilated colon is noted measuring 15.4 cm in largest diameter. This is concerning for possible ileus. Electronically Signed   By: Marijo Conception, M.D.   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  Transverse colon measuring 13.3 cm somewhat improved compared to x-ray from yesterday which showed largest diameter of 15.4 cm -Hypokalemia -Abnormal CT scan concerning for questionable narrowing of the splenic flexure.  Most likely peristalsis.  Patient had colonoscopy in May 2019 which showed no abnormality at splenic flexure.  She also had colonoscopy in 2016 as well.  -Recent CVA on September 13. -A. Fibrillation.  Currently on Eliquis.  Last dose today  Recommendations ------------------------- -Conservative management for now.  Fleet Enema and twice a day MiraLAX. -Replace electrolytes with goal of potassium around 4. -Repeat abdominal x-ray in the morning. -If no improvement, consider flexible sigmoidoscopy for decompression (while on anticoagulation given her history of recent CVA) -Management options were discussed with patient's husband.  They prefer conservative management for now. -GI will follow    LOS: 0 days   Otis Brace  MD, FACP 11/09/2017, 4:41 PM  Contact #  [phone]

## 2017-11-10 ENCOUNTER — Inpatient Hospital Stay (HOSPITAL_COMMUNITY): Payer: Medicare Other | Admitting: Occupational Therapy

## 2017-11-10 ENCOUNTER — Inpatient Hospital Stay (HOSPITAL_COMMUNITY): Payer: Medicare Other

## 2017-11-10 ENCOUNTER — Inpatient Hospital Stay (HOSPITAL_COMMUNITY): Payer: Medicare Other | Admitting: Physical Therapy

## 2017-11-10 LAB — CBC
HCT: 39.9 % (ref 36.0–46.0)
HEMOGLOBIN: 12.8 g/dL (ref 12.0–15.0)
MCH: 29.9 pg (ref 26.0–34.0)
MCHC: 32.1 g/dL (ref 30.0–36.0)
MCV: 93.2 fL (ref 78.0–100.0)
PLATELETS: 343 10*3/uL (ref 150–400)
RBC: 4.28 MIL/uL (ref 3.87–5.11)
RDW: 13.8 % (ref 11.5–15.5)
WBC: 14.1 10*3/uL — ABNORMAL HIGH (ref 4.0–10.5)

## 2017-11-10 LAB — BASIC METABOLIC PANEL
Anion gap: 9 (ref 5–15)
BUN: 43 mg/dL — ABNORMAL HIGH (ref 8–23)
CO2: 17 mmol/L — ABNORMAL LOW (ref 22–32)
Calcium: 8.5 mg/dL — ABNORMAL LOW (ref 8.9–10.3)
Chloride: 115 mmol/L — ABNORMAL HIGH (ref 98–111)
Creatinine, Ser: 1.52 mg/dL — ABNORMAL HIGH (ref 0.44–1.00)
GFR, EST AFRICAN AMERICAN: 39 mL/min — AB (ref >60)
GFR, EST NON AFRICAN AMERICAN: 33 mL/min — AB (ref >60)
GLUCOSE: 122 mg/dL — AB (ref 70–99)
POTASSIUM: 3.2 mmol/L — AB (ref 3.5–5.1)
Sodium: 141 mmol/L (ref 135–145)

## 2017-11-10 LAB — MAGNESIUM: Magnesium: 2.5 mg/dL — ABNORMAL HIGH (ref 1.7–2.4)

## 2017-11-10 MED ORDER — POTASSIUM CHLORIDE CRYS ER 20 MEQ PO TBCR
40.0000 meq | EXTENDED_RELEASE_TABLET | Freq: Two times a day (BID) | ORAL | Status: AC
  Administered 2017-11-10 – 2017-11-11 (×4): 40 meq via ORAL
  Filled 2017-11-10 (×4): qty 2

## 2017-11-10 MED ORDER — FLEET ENEMA 7-19 GM/118ML RE ENEM
1.0000 | ENEMA | Freq: Once | RECTAL | Status: AC
  Administered 2017-11-10: 1 via RECTAL
  Filled 2017-11-10: qty 1

## 2017-11-10 NOTE — Progress Notes (Signed)
[facility] Gastroenterology Progress Note  Holly Flores 72 y.o. 01-Dec-1945  CC: Abdominal distention, Ogilvie's syndrome  Subjective: Active bowel movements yesterday.  Abdominal remains distended but patient feels comfortable.  Denies nausea vomiting.    Objective: Vital signs in last 24 hours: Vitals:   11/10/17 0845 11/10/17 0851  BP:  105/66  Pulse:  64  Resp:  16  Temp: 98.2 F (36.8 C)   SpO2:  97%    Physical Exam:  Alert.  Other/negative x3.  Not in acute distress ABD :  Epigastric distention, bowel sounds present, no peritoneal signs.  Nontender Psych.  Mood and affect normal.  Lab Results: Recent Labs    11/09/17 0642 11/09/17 1539 11/10/17 0440  NA 142  --  141  K 3.1*  --  3.2*  CL 112*  --  115*  CO2 19*  --  17*  GLUCOSE 113*  --  122*  BUN 57*  --  43*  CREATININE 2.09*  --  1.52*  CALCIUM 9.0  --  8.5*  MG  --  2.5* 2.5*   No results for input(s): AST, ALT, ALKPHOS, BILITOT, PROT, ALBUMIN in the last 72 hours. Recent Labs    11/09/17 0642 11/10/17 0440  WBC 16.5* 14.1*  HGB 13.9 12.8  HCT 43.9 39.9  MCV 93.8 93.2  PLT 352 343   No results for input(s): LABPROT, INR in the last 72 hours.    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  Transverse colon measuring 13.3 cm somewhat improved compared to x-ray from yesterday which showed largest diameter of 15.4 cm -Hypokalemia -Abnormal CT scan concerning for questionable narrowing of the splenic flexure.  Most likely peristalsis.  Patient had colonoscopy in May 2019 which showed no abnormality at splenic flexure.  She also had colonoscopy in 2016 as well.  -Recent CVA on September 13. -A. Fibrillation.  Currently on Eliquis.  Last dose today  Recommendations ------------------------- -Continue conservative management for now.  Fleet Enema and twice a day MiraLAX. -Replace electrolytes with goal of potassium around 4. -Repeat abdominal x-ray shows stable  finding pending.  Images reviewed by me personally. -If no improvement, consider flexible sigmoidoscopy for decompression (while on anticoagulation given her history of recent CVA) tomorrow -Start clear liquid diet -Management options were discussed with patient's husband yesterday.  They prefer conservative management for now. -GI will follow    Otis Brace MD, [place] 11/10/2017, 9:42 AM  Contact #  [phone]

## 2017-11-10 NOTE — Progress Notes (Signed)
Fleet enema administered per MD's orders. Patient had very large liquid brown bowel movement after administration.

## 2017-11-10 NOTE — Progress Notes (Signed)
Inpatient Rehabilitation  Patient know to me from recent IP Rehab admission.  Patient appropriate to return and complete IP Rehab course once medically stable.  I have placed a consult order to allow me to follow along.  Call if questions.  Carmelia Roller., CCC/SLP Admission Coordinator  [place]  Cell [phone]

## 2017-11-10 NOTE — Progress Notes (Signed)
Fleet enema administered per MD's orders. After administration of enema, patient passed liquid stool. Will continue to monitor.

## 2017-11-10 NOTE — Care Management Note (Signed)
Case Management Note  Patient Details  Name: Holly Flores MRN: [id number] Date of Birth: 09-09-1945  Subjective/Objective:       Pt presents from CIR(admitted to [facility] 9/16 from [facility]) with ABD distention/pain  Hx of pAF / Eliquis, HTN, hypothyroidism, and recent  CVA with  right hemiparesis and dysarthria.  Nadeen Landau (Spouse) Ionia Schey (Son)    [phone]      PCP: Octavio Graves  Action/Plan: CIR following...hoping to d/c back to CIR... NCM will continue to monitor for transition of care needs.  Expected Discharge Date:                  Expected Discharge Plan:  IP Rehab Facility  In-House Referral:  Clinical Social Work  Discharge planning Services  CM Consult  Post Acute Care Choice:    Choice offered to:     DME Arranged:    DME Agency:     HH Arranged:    [place] Agency:     Status of Service:  In process, will continue to follow  If discussed at Long Length of Stay Meetings, dates discussed:    Additional Comments:  Sharin Mons, RN 11/10/2017, 2:01 PM

## 2017-11-10 NOTE — Progress Notes (Signed)
PROGRESS NOTE    JULY LINAM  ZGY:[id number] DOB: Jan 19, 1946 DOA: 11/09/2017 PCP: Octavio Graves, DO      Brief Narrative:  ANDERSYN FRAGOSO is a 72 y.o. 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   Assessment & Plan:  Colonic pseudoobstruction X-ray this morning personally reviewed by me, transfers: Still greater than 14 cm diameter.  However patient feeling somewhat better, has had 2 enemas, with some improvement in stool output, diet advanced by gastroenterology this morning.  No fever off antibiotics.  Magnesium normal.  Potassium still low. -Stop oxycodone and all opiates -Continue aggressive potassium supplementation -Continue scheduled MiraLAX, enemas -Consulted gastroenterology, appreciate expert advice   Acute kidney injury Creatinine down to 1.5 from >2.  Improving with fluids. -Continue IV fluids overnight, likely stop tomorrow  Leukocytosis:  Likely reactive. -Stop antibiotics and monitor fever curve and WBCs  Paroxysmal atrial fibrillation:  CHA2DS2-Vasc 6.  On eliquis.  Rates <110. -Continue Eliquis -Afib stable at present, if worsening HR noted, or if need for neostigmine, will need telemetry and continuous pulse ox  Recent stroke:  -Continue atorvastatin, Eliquis  Hypothyroidism:  -Continue levothyroxine  Other medicaitons:  -Continue PPI, Xanax      DVT prophylaxis: NA on Eliquis Code Status: FULl Family Communication: None present tdoay MDM and disposition Plan: The below labs and imaging reports were reviewed and summarized above.  Medication management as above.  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  She is only beginnng to advance diet today, and radiographic evidence shows no change in  distension.  Gastroenterology are involved for assistance with neostigmine or mechanical decompression if needed.   Consultants:   Gastroenterology  Procedures:   None  Antimicrobials:   Levaquin x1  Flagyl x1    Subjective: Abdomen feeling better.  No vomiting with jello.  No confusion, fever, dizziness.  No chest pain, dyspnea.  Objective: Vitals:   11/10/17 0540 11/10/17 0845 11/10/17 0851 11/10/17 1350  BP: 109/72  105/66 119/75  Pulse: 70  64 67  Resp: 19  16 18   Temp: 98.3 F (36.8 C) 98.2 F (36.8 C)  98.6 F (37 C)  TempSrc: Oral Oral  Oral  SpO2: 99%  97% 99%  Weight:      Height:        Intake/Output Summary (Last 24 hours) at 11/10/2017 1505 Last data filed at 11/10/2017 1400 Gross per 24 hour  Intake 2861.54 ml  Output -  Net 2861.54 ml   Filed Weights   11/09/17 1553  Weight: 95.9 kg    Examination: General appearance: obese adult female, awake, no acute distress.   HEENT: Anicteric, conjunctiva pink, lids and lashes normal. No nasal deformity, discharge, epistaxis.  Lips moist, dentition normal, OP moist, no oral lesions, tongue red from jello, no hearing defects.   Skin: Warm and dry.  No suspicious rashes or lesions. Cardiac: Iregular rhythm, normal rate, nl S1-S2, no murmurs appreciated.  Capillary refill is brisk.  JVP not visible.  No LE edema.  Radia  pulses 2+ and symmetric. Respiratory: Normal respiratory rate and rhythm.  CTAB without rales or wheezes. Abdomen: Abdomen tender diffusely.  Distended.  No rebound.    MSK: No deformities or effusions. Neuro: Awake and alert.  EOMI, right hemiparesis. Dysarthric.    Psych: Sensorium  intact and responding to questions, attention normal. Affect normal.  Judgment and insight appear normal.    Data Reviewed: I have personally reviewed following labs and imaging studies:  CBC: Recent Labs  Lab 11/08/17 1704 11/09/17 0642 11/10/17 0440  WBC 19.9* 16.5* 14.1*  HGB 14.6 13.9 12.8  HCT  45.4 43.9 39.9  MCV 93.4 93.8 93.2  PLT 389 352 732   Basic Metabolic Panel: Recent Labs  Lab 11/07/17 0629 11/08/17 0649 11/08/17 1647 11/09/17 0642 11/09/17 1539 11/10/17 0440  NA 142 142 140 142  --  141  K 2.8* 2.7* 3.6 3.1*  --  3.2*  CL 108 110 107 112*  --  115*  CO2 20* 21* 20* 19*  --  17*  GLUCOSE 126* 128* 127* 113*  --  122*  BUN 53* 57* 55* 57*  --  43*  CREATININE 1.94* 2.17* 2.14* 2.09*  --  1.52*  CALCIUM 9.2 8.9 8.8* 9.0  --  8.5*  MG  --  2.5*  --   --  2.5* 2.5*   GFR: Estimated Creatinine Clearance: 35.9 mL/min (A) (by C-G formula based on SCr of 1.52 mg/dL (H)). Liver Function Tests: No results for input(s): AST, ALT, ALKPHOS, BILITOT, PROT, ALBUMIN in the last 168 hours. No results for input(s): LIPASE, AMYLASE in the last 168 hours. No results for input(s): AMMONIA in the last 168 hours. Coagulation Profile: No results for input(s): INR, PROTIME in the last 168 hours. Cardiac Enzymes: No results for input(s): CKTOTAL, CKMB, CKMBINDEX, TROPONINI in the last 168 hours. BNP (last 3 results) No results for input(s): PROBNP in the last 8760 hours. HbA1C: No results for input(s): HGBA1C in the last 72 hours. CBG: No results for input(s): GLUCAP in the last 168 hours. Lipid Profile: No results for input(s): CHOL, HDL, LDLCALC, TRIG, CHOLHDL, LDLDIRECT in the last 72 hours. Thyroid Function Tests: No results for input(s): TSH, T4TOTAL, FREET4, T3FREE, THYROIDAB in the last 72 hours. Anemia Panel: No results for input(s): VITAMINB12, FOLATE, FERRITIN, TIBC, IRON, RETICCTPCT in the last 72 hours. Urine analysis:    Component Value Date/Time   COLORURINE YELLOW 11/04/2017 0357   APPEARANCEUR TURBID (A) 11/04/2017 0357   LABSPEC 1.023 11/04/2017 0357   PHURINE 5.0 11/04/2017 0357   GLUCOSEU NEGATIVE 11/04/2017 0357   HGBUR SMALL (A) 11/04/2017 0357   BILIRUBINUR NEGATIVE 11/04/2017 0357   KETONESUR NEGATIVE 11/04/2017 0357   PROTEINUR NEGATIVE  11/04/2017 0357   UROBILINOGEN 0.2 12/29/2014 2314   NITRITE NEGATIVE 11/04/2017 0357   LEUKOCYTESUR NEGATIVE 11/04/2017 0357   Sepsis Labs: @LABRCNTIP (procalcitonin:4,lacticacidven:4)  ) Recent Results (from the past 240 hour(s))  Urine Culture     Status: Abnormal   Collection Time: 11/02/17 10:56 AM  Result Value Ref Range Status   Specimen Description URINE, CLEAN CATCH  Final   Special Requests   Final    NONE Performed at [facility],  [place] [address], [place], [place] [address]    Culture MULTIPLE SPECIES PRESENT, SUGGEST RECOLLECTION (A)  Final   Report Status 11/03/2017 FINAL  Final  Culture, Urine     Status: None   Collection Time: 11/04/17  3:57 AM  Result Value Ref Range Status   Specimen Description URINE, CATHETERIZED  Final   Special Requests NONE  Final   Culture   Final    NO GROWTH Performed at [facility], [place] [address] [place], [place] [address]    Report Status 11/05/2017 FINAL  Final  C  difficile quick scan w PCR reflex     Status: None   Collection Time: 11/08/17  5:33 PM  Result Value Ref Range Status   C Diff antigen NEGATIVE NEGATIVE Final   C Diff toxin NEGATIVE NEGATIVE Final   C Diff interpretation No C. difficile detected.  Final    Comment: Performed at [facility], [place] [address], [place], [place] [address]         Radiology Studies: Ct Abdomen Pelvis Wo Contrast  Result Date: 11/09/2017 CLINICAL DATA:  72 year old female with left lower quadrant pain, nausea, vomiting and constipation. Negative C difficile. Hypokalemia. Subsequent encounter. EXAM: CT ABDOMEN AND PELVIS WITHOUT CONTRAST TECHNIQUE: Multidetector CT imaging of the abdomen and pelvis was performed following the standard protocol without IV contrast. COMPARISON:  11/08/2017 plain film exam.  03/02/2011 CT. FINDINGS: Lower chest: Basilar subsegmental atelectasis. Minimal pulmonary vascular congestion. Heart size top-normal. Right coronary artery  calcification. Hepatobiliary: Taking into account limitation by non contrast imaging, no worrisome hepatic lesion. Post cholecystectomy. Pancreas: 1.1 cm fatty lesion pancreatic tail possibly small lipoma. No evidence of pancreatic inflammation. Spleen: Taking into account limitation by non contrast imaging, no splenic mass or enlargement. Adrenals/Urinary Tract: No adrenal mass. No obstructing stone or hydronephrosis. 1.3 cm right renal cyst. Under distended urinary bladder unremarkable. Stomach/Bowel: Gas and fluid-filled distended colon with the transverse colon measuring up to 13.3 cm. No distal obstructing lesion is noted. Fluid is seen throughout the distal sigmoid colon/rectum. Etiology of these findings indeterminate possibly related to ileus, metabolic abnormality (hypokalemia) or infection (although C difficile is negative). There is an area of questionable narrowing of the splenic flexure of the colon (series 6, image 61 series 7, image 95) and therefore mass not excluded although it is possible this represents peristalsis. No pneumatosis, free air or abnormal fluid collection. No extraluminal bowel inflammation. Appendix unremarkable. Stomach is incompletely distended although otherwise negative. No primary small bowel abnormality. No bowel containing hernia detected. Vascular/Lymphatic: Atherosclerotic plaque aorta, aortic branch vessel and iliac arteries. No aortic aneurysm. Prominent bend of the lower thoracic aorta/upper abdominal aorta. Scattered normal size lymph nodes. Reproductive: Post hysterectomy.  No worrisome adnexal mass. Other: Injection sites anterior abdominal wall. Musculoskeletal: Prior fusion L4-5 and L5-S1. Degenerative changes L3-4 and mid to lower thoracic spine. IMPRESSION: 1. Gas and fluid-filled distended colon with the transverse colon measuring up to 13.3 cm. No distal obstructing lesion is noted. Fluid is seen throughout the distal sigmoid colon/rectum. Etiology of these  findings is indeterminate possibly related to ileus, metabolic abnormality (hypokalemia) or infection (although C difficile is negative). 2. There is an area of questionable narrowing of the splenic flexure of the colon (series 6, image 61 series 7, image 95) and therefore mass not excluded although it is possible this represents peristalsis. 3. No pneumatosis, free air or abnormal fluid collection. No extraluminal bowel inflammation. 4. 1.1 cm fatty lesion pancreas possibly a lipoma. 5. Prior fusion L4-5 and L5-S1. Degenerative changes mid to lower thoracic spine and L3-4 level. 6.  Aortic Atherosclerosis (ICD10-I70.0). Electronically Signed   By: Genia Del M.D.   On: 11/09/2017 10:20   Dg Abd 1 View  Result Date: 11/10/2017 CLINICAL DATA:  Recent stroke 2 weeks ago, persistent abdominal pain and distention, Ogilvie syndrome EXAM: ABDOMEN - 1 VIEW COMPARISON:  CT abdomen and pelvis 11/09/2017 FINDINGS: Persistent gaseous distention of colon, greatest at transverse colon which measures up to 14 cm diameter. Rectum decompressed. No bowel wall thickening or small bowel  distention. Bones demineralized with prior lumbosacral fusion L4-S1. IMPRESSION: Persistent gaseous distention of colon especially transverse colon. Electronically Signed   By: Lavonia Dana M.D.   On: 11/10/2017 11:13        Scheduled Meds: . ALPRAZolam  1 mg Oral QHS  . apixaban  5 mg Oral BID  . atorvastatin  80 mg Oral Daily  . levothyroxine  50 mcg Oral QAC breakfast  . pantoprazole  40 mg Oral Daily  . polyethylene glycol  17 g Oral BID  . potassium chloride  40 mEq Oral BID  . sodium phosphate  1 enema Rectal Once   Continuous Infusions: . dextrose 5 % and 0.45 % NaCl with KCl 40 mEq/L 125 mL/hr at 11/10/17 1235     LOS: 1 day    Time spent: 25 minutes    Edwin Dada, MD [facility] 11/10/2017, 3:05 PM     Pager [phone] --- please page though AMION:  [email or web address] Password [email or web address]  7PM-7AM, please contact night-coverage

## 2017-11-11 ENCOUNTER — Encounter (HOSPITAL_COMMUNITY): Payer: Self-pay | Admitting: *Deleted

## 2017-11-11 ENCOUNTER — Encounter (HOSPITAL_COMMUNITY)
Admission: AD | Disposition: A | Discharge status: Rehabilitation Facility | Payer: Self-pay | Source: Intra-hospital | Attending: Family Medicine

## 2017-11-11 ENCOUNTER — Inpatient Hospital Stay (HOSPITAL_COMMUNITY): Payer: Medicare Other

## 2017-11-11 HISTORY — PX: FLEXIBLE SIGMOIDOSCOPY: SHX5431

## 2017-11-11 LAB — CBC
HEMATOCRIT: 39.9 % (ref 36.0–46.0)
Hemoglobin: 12.8 g/dL (ref 12.0–15.0)
MCH: 30.3 pg (ref 26.0–34.0)
MCHC: 32.1 g/dL (ref 30.0–36.0)
MCV: 94.5 fL (ref 78.0–100.0)
Platelets: 330 10*3/uL (ref 150–400)
RBC: 4.22 MIL/uL (ref 3.87–5.11)
RDW: 14.1 % (ref 11.5–15.5)
WBC: 13 10*3/uL — AB (ref 4.0–10.5)

## 2017-11-11 LAB — BASIC METABOLIC PANEL
ANION GAP: 4 — AB (ref 5–15)
BUN: 27 mg/dL — AB (ref 8–23)
CO2: 15 mmol/L — AB (ref 22–32)
Calcium: 8.6 mg/dL — ABNORMAL LOW (ref 8.9–10.3)
Chloride: 122 mmol/L — ABNORMAL HIGH (ref 98–111)
Creatinine, Ser: 1.31 mg/dL — ABNORMAL HIGH (ref 0.44–1.00)
GFR calc Af Amer: 46 mL/min — ABNORMAL LOW (ref >60)
GFR calc non Af Amer: 40 mL/min — ABNORMAL LOW (ref >60)
GLUCOSE: 116 mg/dL — AB (ref 70–99)
POTASSIUM: 3.9 mmol/L (ref 3.5–5.1)
Sodium: 141 mmol/L (ref 135–145)

## 2017-11-11 LAB — MAGNESIUM: Magnesium: 2.4 mg/dL (ref 1.7–2.4)

## 2017-11-11 SURGERY — SIGMOIDOSCOPY, FLEXIBLE
Anesthesia: Moderate Sedation

## 2017-11-11 MED ORDER — SODIUM CHLORIDE 0.9 % IV SOLN: INTRAVENOUS | Status: DC

## 2017-11-11 NOTE — Progress Notes (Signed)
RT placed patient on CPAP HS. 2L O2 bleed in needed. Patient tolerating well.  ?

## 2017-11-11 NOTE — Op Note (Signed)
[facility] Patient Name: Holly Flores Procedure Date : 11/11/2017 MRN: [id number] Attending MD: Otis Brace , MD Date of Birth: [birth year] CSN: [id number] Age: 72 Admit Type: Inpatient Procedure:                Flexible Sigmoidoscopy Indications:              abdominal pain, Abnormal CT of the GI tract,                            abdominal distention, dilated transverse colon up                            to 14 cm Providers:                Otis Brace, MD, Carlyn Reichert, RN, Cletis Athens, Technician Referring MD:              Medicines:                None Complications:            No immediate complications. Estimated Blood Loss:     Estimated blood loss: none. Procedure:                Pre-Anesthesia Assessment:                           - Prior to the procedure, a History and Physical                            was performed, and patient medications and                            allergies were reviewed. The patient's tolerance of                            previous anesthesia was also reviewed. The risks                            and benefits of the procedure and the sedation                            options and risks were discussed with the patient.                            All questions were answered, and informed consent                            was obtained. Prior Anticoagulants: The patient has                            taken Eliquis (apixaban), last dose was day of                            procedure. After  reviewing the risks and benefits,                            the patient was deemed in satisfactory condition to                            undergo the procedure.                           After obtaining informed consent, the scope was                            passed under direct vision. The [id number]-[id number] ([id number])                            Olympus Adult EGD was introduced through the anus         and advanced to the the left transverse colon. The                            flexible sigmoidoscopy was accomplished without                            difficulty. The patient tolerated the procedure                            well. The quality of the bowel preparation was poor. Scope In: Scope Out: Findings:      The perianal and digital rectal examinations were normal.      The lumen of the sigmoid colon, descending colon, splenic flexure and       transverse colon was significantly dilated. decompression was achieved.       More than 1000 cc of liquid stool was suctioned.      The retroflexed view of the distal rectum and anal verge was normal and       showed no anal or rectal abnormalities. Impression:               - Preparation of the colon was poor.                           - Dilated in the sigmoid colon, in the descending                            colon, at the splenic flexure and in the transverse                            colon.                           - No specimens collected. Recommendation:           - Return patient to hospital ward for ongoing care.                           - Full liquid diet.                           -  repeat x-ray today. If x-ray showed improvement,                            okay to resume anticoagulation. Discussed with                            hospitalist. Procedure Code(s):        --- Professional ---                           [phone], Sigmoidoscopy, flexible; diagnostic,                            including collection of specimen(s) by brushing or                            washing, when performed (separate procedure) Diagnosis Code(s):        --- Professional ---                           K59.39, Other megacolon                           R10.13, Epigastric pain                           R93.3, Abnormal findings on diagnostic imaging of                            other parts of digestive tract CPT copyright 2017 American Medical  Association. All rights reserved. The codes documented in this report are preliminary and upon coder review may  be revised to meet current compliance requirements. Otis Brace, MD Otis Brace, MD 11/11/2017 12:32:41 PM Number of Addenda: 0

## 2017-11-11 NOTE — Progress Notes (Signed)
[facility] Gastroenterology Progress Note  Holly Flores 72 y.o. 03-15-1945  CC: Abdominal distention, Ogilvie's syndrome  Subjective: She continues to have epigastric distention and abdominal pain.  No significant improvement in distention with a bowel regimen.    Objective: Vital signs in last 24 hours: Vitals:   11/11/17 0546 11/11/17 1055  BP: 115/69 108/83  Pulse: 71 84  Resp: 19 18  Temp: 98.8 F (37.1 C) 98.8 F (37.1 C)  SpO2: 97% 99%    Physical Exam:  Alert.  Other/negative x3.  Not in acute distress ABD :  Epigastric distention, bowel sounds present, no peritoneal signs.  Nontender Psych.  Mood and affect normal.  Lab Results: Recent Labs    11/10/17 0440 11/11/17 0528  NA 141 141  K 3.2* 3.9  CL 115* 122*  CO2 17* 15*  GLUCOSE 122* 116*  BUN 43* 27*  CREATININE 1.52* 1.31*  CALCIUM 8.5* 8.6*  MG 2.5* 2.4   No results for input(s): AST, ALT, ALKPHOS, BILITOT, PROT, ALBUMIN in the last 72 hours. Recent Labs    11/10/17 0440 11/11/17 0528  WBC 14.1* 13.0*  HGB 12.8 12.8  HCT 39.9 39.9  MCV 93.2 94.5  PLT 343 330   No results for input(s): LABPROT, INR in the last 72 hours.    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  Transverse colon measuring 13.3 cm somewhat improved compared to x-ray from yesterday which showed largest diameter of 15.4 cm -Hypokalemia -Abnormal CT scan concerning for questionable narrowing of the splenic flexure.  Most likely peristalsis.  Patient had colonoscopy in May 2019 which showed no abnormality at splenic flexure.  She also had colonoscopy in 2016 as well.  -Recent CVA on September 13. -A. Fibrillation.  Currently on Eliquis.  Last dose today  Recommendations ------------------------- -X-ray showed no significant improvement in abdominal distention.  Patient continues to have discomfort.  She is agreeable for unsedated flexible sigmoidoscopy.  Her anticoagulation currently on  hold but she received her dose this morning.  High risk of bleeding and perforation discussed with the patient.  She verbalized understanding.  If not able to achieve decompression through unsedated flexible sigmoidoscopy today, she will be scheduled for procedure under anesthesia in the next few days.   - D/W Dr. Loleta Books.     Otis Brace MD, [place] 11/11/2017, 11:32 AM  Contact #  [phone]

## 2017-11-11 NOTE — Brief Op Note (Signed)
11/09/2017 - 11/11/2017  12:33 PM  PATIENT:  Holly Flores  72 y.o. female  PRE-OPERATIVE DIAGNOSIS:  abdominal distention  POST-OPERATIVE DIAGNOSIS:  unsedated flexible sigmoidoscopy for decompression  PROCEDURE:  Procedure(s): FLEXIBLE SIGMOIDOSCOPY (N/A)  SURGEON:  Surgeon(s) and Role:    * Zailey Audia, MD - Primary  Findings ---------- - Significantly dilated colon with liquid stool. Decompression was performed with removal of at least 1000 mL of liquid stool.  Recommendations --------------------------- - Check x-ray today, If xray - showed improvement okay to resume anticoagulation. - continue bowel regimen with MiraLAX. - Start full liquid diet.   Otis Brace MD, [place] 11/11/2017, 12:35 PM  Contact #  [phone]

## 2017-11-11 NOTE — Progress Notes (Signed)
RT placed patient on CPAP HS. 2L O2 bleed in needed. Patient tolerating well at this time. 

## 2017-11-11 NOTE — Progress Notes (Signed)
PROGRESS NOTE    Holly Flores  OXB:[id number] DOB: 27-Nov-1945 DOA: 11/09/2017 PCP: Octavio Graves, DO      Brief Narrative:  Holly Flores is a 72 y.o. 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   Assessment & Plan:  Colonic pseudoobstruction X-ray this morning again personally reviewed by me, still shows dilated transverse colon.   Magnesium normal.  Potassium replete.    Today went for flex sig, decompression achieved and 1 L liquid stool removed.  However post procedure x-ray still shows greater than 10 cm transverse colon, furthermore patient able to transfer from bed to chair with PT after procedure, but was still limited by abdominal pain/distension -Stop oxycodone and all opiates - Keep potassium greater than 4 -Continue scheduled MiraLAX -Consult to gastroenterology, appreciate expert guidance     Acute kidney injury Creatinine >2 at time of transfer, again improved today.    -D/c IV fluids  Leukocytosis:  Likely reactive.  No fever off antibiotics.  Paroxysmal atrial fibrillation:  CHA2DS2-Vasc 6.  On eliquis.  Rates <110. -Hold Eliquis -Will need tele if Neostigmine contemplated  Recent stroke:  -Continue atorvastatin  Hypothyroidism:  -Continue levothyroxine  Other medicaitons:  -Continue PPI -Continue Xanax      DVT prophylaxis: NA on Eliquis Code Status: FULl Family Communication: None present tdoay MDM and disposition Plan: The below labs and imaging reports were reviewed and summarized above.  Medication management as above.  The patient was transferred from inpatient rehab due to ileus, abdominal distention, abdominal pain from colonic pseudoobstruction.  Her colonic distention and pain is persistent despite  enemas/MiraLAX, and today flexible sigmoidoscopy.  Will continue Miralax and repeat abd x-ray tomorrow.  CIR are following for readmission to inpatient rehab when able to participate in therapies.        Consultants:   Gastroenterology  Procedures:   Flex sig 9/26 Impression:               - Preparation of the colon was poor.                           - Dilated in the sigmoid colon, in the descending                            colon, at the splenic flexure and in the transverse                            colon.                           - No specimens collected. Recommendation:           - Return patient to hospital ward for ongoing care.                           - Full liquid diet.                           - repeat x-ray today. If x-ray showed improvement,  okay to resume anticoagulation.       Antimicrobials:   Levaquin x1  Flagyl x1    Subjective: Abdomen no change.  No vomiting. No stool.  No fever.  No confusion.  Still dysarthria and weakness from recent stroke.   Post-procedure, she is still uncomfortable, although somewhat better slightly.    Objective: Vitals:   11/11/17 1215 11/11/17 1220 11/11/17 1239 11/11/17 1351  BP: (!) 110/57 112/87 92/71 116/82  Pulse: 61 69 67 66  Resp: (!) 21 15 18  (!) 22  Temp:   98.6 F (37 C) 98 F (36.7 C)  TempSrc:   Oral Oral  SpO2: 100% 98% 100% 100%  Weight:      Height:        Intake/Output Summary (Last 24 hours) at 11/11/2017 1525 Last data filed at 11/11/2017 0900 Gross per 24 hour  Intake 2271.67 ml  Output -  Net 2271.67 ml   Filed Weights   11/09/17 1553  Weight: 95.9 kg    Examination: General appearance: Obese adult female, lying in bed, no acute distress.  Interactive.   HEENT: Anicteric, conjunctiva pink, lids and lashes normal. No nasal deformity, discharge, epistaxis.  Lips moist, dentition normal, oropharynx moist, no oral lesions, hearing normal. Skin: Skin  warm and dry without suspicious rashes or lesions. Cardiac: Irregularly irregular, normal rate, no murmurs appreciated.  No lower extremity edema, JVP not visible. Respiratory: Lungs clear to auscultation, normal respiratory rate and rhythm. Abdomen: Abdomen distended, tender diffusely.  Tympanitic.    MSK: No deformities or effusions. Neuro: Awake and alert, extraocular movements intact, right hemiparesis, dysarthric.    Psych: Sensorium intact and responding to questions, attention normal. Affect normal.  Judgment and insight appear normal.    Data Reviewed: I have personally reviewed following labs and imaging studies:  CBC: Recent Labs  Lab 11/08/17 1704 11/09/17 0642 11/10/17 0440 11/11/17 0528  WBC 19.9* 16.5* 14.1* 13.0*  HGB 14.6 13.9 12.8 12.8  HCT 45.4 43.9 39.9 39.9  MCV 93.4 93.8 93.2 94.5  PLT 389 352 343 154   Basic Metabolic Panel: Recent Labs  Lab 11/08/17 0649 11/08/17 1647 11/09/17 0642 11/09/17 1539 11/10/17 0440 11/11/17 0528  NA 142 140 142  --  141 141  K 2.7* 3.6 3.1*  --  3.2* 3.9  CL 110 107 112*  --  115* 122*  CO2 21* 20* 19*  --  17* 15*  GLUCOSE 128* 127* 113*  --  122* 116*  BUN 57* 55* 57*  --  43* 27*  CREATININE 2.17* 2.14* 2.09*  --  1.52* 1.31*  CALCIUM 8.9 8.8* 9.0  --  8.5* 8.6*  MG 2.5*  --   --  2.5* 2.5* 2.4   GFR: Estimated Creatinine Clearance: 41.7 mL/min (A) (by C-G formula based on SCr of 1.31 mg/dL (H)). Liver Function Tests: No results for input(s): AST, ALT, ALKPHOS, BILITOT, PROT, ALBUMIN in the last 168 hours. No results for input(s): LIPASE, AMYLASE in the last 168 hours. No results for input(s): AMMONIA in the last 168 hours. Coagulation Profile: No results for input(s): INR, PROTIME in the last 168 hours. Cardiac Enzymes: No results for input(s): CKTOTAL, CKMB, CKMBINDEX, TROPONINI in the last 168 hours. BNP (last 3 results) No results for input(s): PROBNP in the last 8760 hours. HbA1C: No results for  input(s): HGBA1C in the last 72 hours. CBG: No results for input(s): GLUCAP in the last 168 hours. Lipid Profile: No results for input(s): CHOL, HDL, LDLCALC,  TRIG, CHOLHDL, LDLDIRECT in the last 72 hours. Thyroid Function Tests: No results for input(s): TSH, T4TOTAL, FREET4, T3FREE, THYROIDAB in the last 72 hours. Anemia Panel: No results for input(s): VITAMINB12, FOLATE, FERRITIN, TIBC, IRON, RETICCTPCT in the last 72 hours. Urine analysis:    Component Value Date/Time   COLORURINE YELLOW 11/04/2017 0357   APPEARANCEUR TURBID (A) 11/04/2017 0357   LABSPEC 1.023 11/04/2017 0357   PHURINE 5.0 11/04/2017 0357   GLUCOSEU NEGATIVE 11/04/2017 0357   HGBUR SMALL (A) 11/04/2017 0357   BILIRUBINUR NEGATIVE 11/04/2017 0357   KETONESUR NEGATIVE 11/04/2017 0357   PROTEINUR NEGATIVE 11/04/2017 0357   UROBILINOGEN 0.2 12/29/2014 2314   NITRITE NEGATIVE 11/04/2017 0357   LEUKOCYTESUR NEGATIVE 11/04/2017 0357   Sepsis Labs: @LABRCNTIP (procalcitonin:4,lacticacidven:4)  ) Recent Results (from the past 240 hour(s))  Urine Culture     Status: Abnormal   Collection Time: 11/02/17 10:56 AM  Result Value Ref Range Status   Specimen Description URINE, CLEAN CATCH  Final   Special Requests   Final    NONE Performed at [facility], [place] [address], [place], [place] [address]    Culture MULTIPLE SPECIES PRESENT, SUGGEST RECOLLECTION (A)  Final   Report Status 11/03/2017 FINAL  Final  Culture, Urine     Status: None   Collection Time: 11/04/17  3:57 AM  Result Value Ref Range Status   Specimen Description URINE, CATHETERIZED  Final   Special Requests NONE  Final   Culture   Final    NO GROWTH Performed at [facility], [place] [address], [place], [place] [address]    Report Status 11/05/2017 FINAL  Final  C difficile quick scan w PCR reflex     Status: None   Collection Time: 11/08/17  5:33 PM  Result Value Ref Range Status   C Diff antigen NEGATIVE NEGATIVE Final   C Diff toxin  NEGATIVE NEGATIVE Final   C Diff interpretation No C. difficile detected.  Final    Comment: Performed at [facility], [place] [address], [place], [place] [address]         Radiology Studies: Dg Abd 1 View  Result Date: 11/10/2017 CLINICAL DATA:  Recent stroke 2 weeks ago, persistent abdominal pain and distention, Ogilvie syndrome EXAM: ABDOMEN - 1 VIEW COMPARISON:  CT abdomen and pelvis 11/09/2017 FINDINGS: Persistent gaseous distention of colon, greatest at transverse colon which measures up to 14 cm diameter. Rectum decompressed. No bowel wall thickening or small bowel distention. Bones demineralized with prior lumbosacral fusion L4-S1. IMPRESSION: Persistent gaseous distention of colon especially transverse colon. Electronically Signed   By: Lavonia Dana M.D.   On: 11/10/2017 11:13   Dg Abd Portable 1v  Result Date: 11/11/2017 CLINICAL DATA:  Follow-up abdominal distension EXAM: PORTABLE ABDOMEN - 1 VIEW COMPARISON:  11/11/2017 FINDINGS: Scattered large and small bowel gas is noted. Distension of the transverse colon is again seen with slightly less than that noted on the prior exam. No free air is seen. Postsurgical changes in the lower lumbar spine are noted. IMPRESSION: Persistent but slightly improved dilatation of the transverse colon. Electronically Signed   By: Inez Catalina M.D.   On: 11/11/2017 13:54   Dg Abd Portable 1v  Result Date: 11/11/2017 CLINICAL DATA:  Abdominal distension. EXAM: PORTABLE ABDOMEN - 1 VIEW COMPARISON:  Radiograph of November 10, 2017. FINDINGS: Stable dilated air-filled colon, particularly transverse colon, which measures 13.6 cm. No significant small bowel dilatation is noted. Postsurgical changes are seen  in the lumbar spine. Status post cholecystectomy. IMPRESSION: Stable dilated air-filled colon is noted, particularly transverse colon. Electronically Signed   By: Marijo Conception, M.D.   On: 11/11/2017 09:38        Scheduled Meds: . ALPRAZolam   1 mg Oral QHS  . apixaban  5 mg Oral BID  . atorvastatin  80 mg Oral Daily  . levothyroxine  50 mcg Oral QAC breakfast  . pantoprazole  40 mg Oral Daily  . polyethylene glycol  17 g Oral BID  . potassium chloride  40 mEq Oral BID   Continuous Infusions: . dextrose 5 % and 0.45 % NaCl with KCl 40 mEq/L 125 mL/hr at 11/11/17 1346     LOS: 2 days    Time spent: 25 minutes    Edwin Dada, MD [facility] 11/11/2017, 3:25 PM     Pager [phone] --- please page though AMION:  [email or web address] Password [email or web address] If 7PM-7AM, please contact night-coverage

## 2017-11-11 NOTE — Evaluation (Signed)
Physical Therapy Evaluation Patient Details Name: Holly Flores MRN: [id number] DOB: May 28, 1945 Today's Date: 11/11/2017   History of Present Illness  Patient is a 72 y/o female admitted from inpatient rehab with primary complaints of abdominal distention and pain. 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 CT abdomen obtained that showed 13cm dilated colon, no obstruction, no inflammation.    Clinical Impression  [name] is a very pleasant 72 y/o female admitted with the above listed diagnosis. Prior to this admission patient was at [facility] receiving therapies for recent CVA. Patient today requiring physical assist for bed mobility and transfers with use of Stedy with patient able to perform sit to stand at [facility] with Min A, otherwise requires Mod/Max A for bed mobility. Good effort given by patient throughout session as patient is very motivated to regain mobility. Will recommend return to CIR at discharge to continue to progress safe functional mobility. PT to follow acutely.     Follow Up Recommendations CIR    Equipment Recommendations  Other (comment)(defer)    Recommendations for Other Services Rehab consult;OT consult     Precautions / Restrictions Precautions Precautions: Fall Precaution Comments: R hemi, R inattention Restrictions Weight Bearing Restrictions: No      Mobility  Bed Mobility Overal bed mobility: Needs Assistance Bed Mobility: Rolling;Supine to Sit Rolling: Mod assist(R <> L)   Supine to sit: Mod assist;Max assist     General bed mobility comments: physical assist to bring LE to EOB and for hip/trunk righting; verbal/tactile cueing throughout for sequencing; able to sit EOB x 6 min at Min guard level   Transfers Overall transfer level: Needs assistance    Transfers: Sit to/from Stand Sit to Stand: Min assist         General transfer comment: Min A at [facility] with patient able to pull up on bar with good effort; verbal cues for posturing with ability to self-correct  Ambulation/Gait             General Gait Details: deferred for patient therapist safety  Stairs            Wheelchair Mobility    Modified Rankin (Stroke Patients Only)       Balance Overall balance assessment: Needs assistance Sitting-balance support: Feet supported;Single extremity supported Sitting balance-Leahy Scale: Fair     Standing balance support: Single extremity supported;During functional activity Standing balance-Leahy Scale: Poor Standing balance comment: reliant on external support                             Pertinent Vitals/Pain Pain Assessment: Faces Faces Pain Scale: Hurts little more Pain Location: abdomen Pain Descriptors / Indicators: Aching;Discomfort;Grimacing;Sore Pain Intervention(s): Limited activity within patient's tolerance;Monitored during session    [place] expects to be discharged to:: Inpatient rehab Living Arrangements: Spouse/significant other Available Help at Discharge: Family Type of Home: House Home Access: Stairs to enter Entrance Stairs-Rails: None Entrance Stairs-Number of Steps: 2-3 steps to enter house with rails, 2 steps down to den but pt reports she does not have to go down there, pt states husband can install L rail at home entry Home Layout: One level Home Equipment: [place] - single point;Walker - 2 wheels;Shower seat;Bedside commode;Wheelchair - manual Additional Comments: home environment gleaned from chart review - hopeful  return to CIR at discharge    Prior Function Level of Independence: Needs assistance   Gait / Transfers Assistance Needed: at CIR: required use of Stedy and w/c for transfers and mobility     Comments: prior to initial admission was  independent in the home environment     Hand Dominance   Dominant Hand: Right    Extremity/Trunk Assessment   Upper Extremity Assessment Upper Extremity Assessment: Defer to OT evaluation    Lower Extremity Assessment Lower Extremity Assessment: RLE deficits/detail;LLE deficits/detail RLE Deficits / Details: unable to lift R LE against gravity - requires physical assist for all mobility of LE as well as hip/trunk righting LLE Deficits / Details: WFL    Cervical / Trunk Assessment Cervical / Trunk Assessment: Normal  Communication   Communication: No difficulties  Cognition Arousal/Alertness: Awake/alert Behavior During Therapy: WFL for tasks assessed/performed Overall Cognitive Status: Within Functional Limits for tasks assessed                                        General Comments      Exercises     Assessment/Plan    PT Assessment Patient needs continued PT services  PT Problem List Decreased strength;Decreased activity tolerance;Decreased balance;Decreased mobility;Decreased knowledge of use of DME;Decreased safety awareness       PT Treatment Interventions Gait training;Functional mobility training;Therapeutic activities;Therapeutic exercise;Patient/family education;Neuromuscular re-education;DME instruction;Balance training    PT Goals (Current goals can be found in the Care Plan section)  Acute Rehab PT Goals Patient Stated Goal: regain strength; go back to rehab PT Goal Formulation: With patient/family Time For Goal Achievement: 11/25/17 Potential to Achieve Goals: Fair    Frequency Min 4X/week   Barriers to discharge        Co-evaluation               AM-PAC PT "6 Clicks" Daily Activity  Outcome Measure Difficulty turning over in bed (including adjusting bedclothes, sheets and blankets)?: Unable Difficulty moving from lying on back to sitting on the side of the bed? : Unable Difficulty sitting down on and standing up from  a chair with arms (e.g., wheelchair, bedside commode, etc,.)?: Unable Help needed moving to and from a bed to chair (including a wheelchair)?: A Lot Help needed walking in hospital room?: Total Help needed climbing 3-5 steps with a railing? : Total 6 Click Score: 7    End of Session Equipment Utilized During Treatment: Gait belt Activity Tolerance: Patient tolerated treatment well Patient left: in chair;with call bell/phone within reach;with chair alarm set;with family/visitor present;with nursing/sitter in room Nurse Communication: Mobility status PT Visit Diagnosis: Other abnormalities of gait and mobility (R26.89);Unsteadiness on feet (R26.81);Muscle weakness (generalized) (M62.81)    Time: 0100-7121 PT Time Calculation (min) (ACUTE ONLY): 34 min   Charges:   PT Evaluation $PT Eval Moderate Complexity: 1 Mod PT Treatments $Therapeutic Activity: 8-22 mins      Lanney Gins, PT, DPT Supplemental Physical Therapist 11/11/17 9:45 AM Pager: [phone] Office: [phone]

## 2017-11-11 NOTE — Progress Notes (Signed)
Pharmacy aware of IV med dose missing and awaiting medication

## 2017-11-11 NOTE — Progress Notes (Signed)
Pt to Endo for flex sigmoidoscopy.  Team aware pt on Elquis.  Pt transported via unit bed with transporters x1 at bedside.  [id number]

## 2017-11-12 ENCOUNTER — Inpatient Hospital Stay (HOSPITAL_COMMUNITY): Payer: Medicare Other

## 2017-11-12 DIAGNOSIS — N179 Acute kidney failure, unspecified: Secondary | ICD-10-CM

## 2017-11-12 LAB — CBC
HCT: 41.7 % (ref 36.0–46.0)
HEMOGLOBIN: 13 g/dL (ref 12.0–15.0)
MCH: 30 pg (ref 26.0–34.0)
MCHC: 31.2 g/dL (ref 30.0–36.0)
MCV: 96.1 fL (ref 78.0–100.0)
Platelets: 323 10*3/uL (ref 150–400)
RBC: 4.34 MIL/uL (ref 3.87–5.11)
RDW: 14.4 % (ref 11.5–15.5)
WBC: 13 10*3/uL — ABNORMAL HIGH (ref 4.0–10.5)

## 2017-11-12 LAB — BASIC METABOLIC PANEL
Anion gap: 7 (ref 5–15)
BUN: 19 mg/dL (ref 8–23)
CALCIUM: 8.9 mg/dL (ref 8.9–10.3)
CHLORIDE: 122 mmol/L — AB (ref 98–111)
CO2: 14 mmol/L — ABNORMAL LOW (ref 22–32)
CREATININE: 1.25 mg/dL — AB (ref 0.44–1.00)
GFR calc non Af Amer: 42 mL/min — ABNORMAL LOW (ref >60)
GFR, EST AFRICAN AMERICAN: 49 mL/min — AB (ref >60)
GLUCOSE: 102 mg/dL — AB (ref 70–99)
Potassium: 3.8 mmol/L (ref 3.5–5.1)
Sodium: 143 mmol/L (ref 135–145)

## 2017-11-12 LAB — MAGNESIUM: Magnesium: 2.3 mg/dL (ref 1.7–2.4)

## 2017-11-12 MED ORDER — POTASSIUM CHLORIDE 10 MEQ/100ML IV SOLN
10.0000 meq | INTRAVENOUS | Status: AC
  Administered 2017-11-12 (×2): 10 meq via INTRAVENOUS
  Filled 2017-11-12 (×2): qty 100

## 2017-11-12 NOTE — Progress Notes (Signed)
[facility] Progress Note  Subjective: The patient is seen in follow-up today. 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 She underwent flexible sigmoidoscopy yesterday with decompression. She had a significantly dilated colon with liquid stool and 1000 mL of liquid stool was removed. She feels better today but her abdomen is still distended.  Objective: Vital signs in last 24 hours: Temp:  [98 F (36.7 C)-98.9 F (37.2 C)] 98 F (36.7 C) (09/27 1329) Pulse Rate:  [75-86] 86 (09/27 1329) Resp:  [17-19] 19 (09/27 0616) BP: (110-122)/(72-80) 118/74 (09/27 1329) SpO2:  [98 %-100 %] 100 % (09/27 1329) Weight change:    PE:  No distress  Abdominal distention but it is soft and not tense  Lab Results: Results for orders placed or performed during the hospital encounter of 11/09/17 (from the past 24 hour(s))  CBC     Status: Abnormal   Collection Time: 11/12/17  4:40 AM  Result Value Ref Range   WBC 13.0 (H) 4.0 - 10.5 K/uL   RBC 4.34 3.87 - 5.11 MIL/uL   Hemoglobin 13.0 12.0 - 15.0 g/dL   HCT 41.7 36.0 - 46.0 %   MCV 96.1 78.0 - 100.0 fL   MCH 30.0 26.0 - 34.0 pg   MCHC 31.2 30.0 - 36.0 g/dL   RDW 14.4 11.5 - 15.5 %   Platelets 323 150 - 400 K/uL  Basic metabolic panel     Status: Abnormal   Collection Time: 11/12/17  4:40 AM  Result Value Ref Range   Sodium 143 135 - 145 mmol/L   Potassium 3.8 3.5 - 5.1 mmol/L   Chloride 122 (H) 98 - 111 mmol/L   CO2 14 (L) 22 - 32 mmol/L   Glucose, Bld 102 (H) 70 - 99 mg/dL   BUN 19 8 - 23 mg/dL   Creatinine, Ser 1.25 (H) 0.44 - 1.00 mg/dL   Calcium 8.9 8.9 - 10.3 mg/dL   GFR calc non Af Amer 42 (L) >60 mL/min   GFR calc Af Amer 49 (L) >60 mL/min   Anion gap 7 5 - 15  Magnesium     Status: None   Collection Time: 11/12/17  4:40 AM  Result Value Ref Range   Magnesium 2.3 1.7 - 2.4 mg/dL    Studies/Results: Dg Abd 1 View  Result Date: 11/12/2017 CLINICAL DATA:  Ogilvie's syndrome [id number], continued abd pain (complaints of bilat.  upper abd pain today) EXAM: ABDOMEN - 1 VIEW COMPARISON:  11/11/2017 FINDINGS: There is persistent significant dilatation of the transverse colon. No small bowel dilatation or evidence for free intraperitoneal air. Surgical clips are present in the RIGHT UPPER QUADRANT the abdomen. Remote lumbar spine fusion. IMPRESSION: Persistent gaseous distention of the colon. Electronically Signed   By: Nolon Nations M.D.   On: 11/12/2017 10:28      Assessment: Colonic pseudoobstruction    Plan:   Continue supportive care. If this does not improve consider neostigmine as the next step, versus repeat colonic decompression    Cassell Clement 11/12/2017, 4:03 PM  Pager: [phone] If no answer or after 5 PM call [phone]

## 2017-11-12 NOTE — Progress Notes (Signed)
Physical Therapy Treatment Patient Details Name: Holly Flores MRN: [id number] DOB: 05-21-1945 Today's Date: 11/12/2017    History of Present Illness Patient is a 72 y/o female admitted from inpatient rehab with primary complaints of abdominal distention and pain. 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 CT abdomen obtained that showed 13cm dilated colon, no obstruction, no inflammation.    PT Comments    Patient seen for mobiltiy progression. Pt stood X4 using Stedy standing frame and with +2 assist without use of Stedy. Pt will continue to benefit from further skilled PT services to maximize independence and safety with mobility.    Follow Up Recommendations  CIR     Equipment Recommendations  Other (comment)(defer)    Recommendations for Other Services Rehab consult;OT consult     Precautions / Restrictions Precautions Precautions: Fall Precaution Comments: R hemi, R inattention Restrictions Weight Bearing Restrictions: No    Mobility  Bed Mobility Overal bed mobility: Needs Assistance Bed Mobility: Supine to Sit     Supine to sit: Mod assist;HOB elevated     General bed mobility comments: assist to bring R LE to EOB and to elevate trunk into sitting; cues for sequencing   Transfers Overall transfer level: Needs assistance Equipment used: 2 person hand held assist Transfers: Sit to/from Stand Sit to Stand: Min assist;Mod assist;+2 safety/equipment         General transfer comment: pt stood X2 with Stedy and X2 without; min A to power up and to gain balance with use of Stedy standing frame and mod A +2 for stand without AD with R knee blocked and R UE supported  Ambulation/Gait                 Stairs             Wheelchair Mobility    Modified Rankin  (Stroke Patients Only)       Balance Overall balance assessment: Needs assistance Sitting-balance support: Feet supported;Single extremity supported Sitting balance-Leahy Scale: Fair     Standing balance support: Single extremity supported;During functional activity Standing balance-Leahy Scale: Poor Standing balance comment: reliant on external support; cues needed for weight shifting and midline posturing in standing                             Cognition Arousal/Alertness: Awake/alert Behavior During Therapy: WFL for tasks assessed/performed Overall Cognitive Status: Within Functional Limits for tasks assessed                                        Exercises      General Comments        Pertinent Vitals/Pain Pain Assessment: No/denies pain    Home Living                      Prior Function            PT Goals (current goals can now be found in the care plan section) Acute Rehab PT Goals Patient Stated Goal: regain strength; go back to rehab Progress towards PT goals: Progressing toward goals    Frequency    Min 4X/week      PT Plan Current  plan remains appropriate    Co-evaluation              AM-PAC PT "6 Clicks" Daily Activity  Outcome Measure  Difficulty turning over in bed (including adjusting bedclothes, sheets and blankets)?: Unable Difficulty moving from lying on back to sitting on the side of the bed? : Unable Difficulty sitting down on and standing up from a chair with arms (e.g., wheelchair, bedside commode, etc,.)?: Unable Help needed moving to and from a bed to chair (including a wheelchair)?: A Lot Help needed walking in hospital room?: Total Help needed climbing 3-5 steps with a railing? : Total 6 Click Score: 7    End of Session Equipment Utilized During Treatment: Gait belt Activity Tolerance: Patient tolerated treatment well Patient left: in chair;with call bell/phone within reach;with  chair alarm set;with family/visitor present Nurse Communication: Mobility status PT Visit Diagnosis: Other abnormalities of gait and mobility (R26.89);Unsteadiness on feet (R26.81);Muscle weakness (generalized) (M62.81)     Time: 1030-1050 PT Time Calculation (min) (ACUTE ONLY): 20 min  Charges:  $Therapeutic Activity: 8-22 mins                     Earney Navy, PTA Acute Rehabilitation Services Pager: [phone] Office: [phone]     Darliss Cheney 11/12/2017, 11:54 AM

## 2017-11-12 NOTE — Progress Notes (Signed)
Inpatient Rehabilitation  Continuing to follow for re-admission to CIR to complete the rehab course when patient is medically ready and able to tolerate therapies.  Note patient remains distended and on a full liquid diet.  Will follow up again Monday, 11/15/17.  Call if questions.   Carmelia Roller., CCC/SLP Admission Coordinator  [place]  Cell [phone]

## 2017-11-12 NOTE — Progress Notes (Addendum)
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  Subjective: Patient states that her abdominal pain and bloating has improved from yesterday since the colonic decompression. Admits to mild abdominal pain immediately before voiding stools that is subsequently relieved by bowel movement. Also admits to being mildly SOB. Denies nausea, vomiting, fatigue, or dizziness.   Assessment & Plan:  Colonic pseudoobstruction, consistent with Ogilve's syndrome: Continues to have abdominal distention-although reports that it is somewhat better-underwent flexible sigmoidoscopy with decompression.  KUB done today continues to show significant distention of the transverse colon.  Spoke with Dr. Penelope Coop over the phone-he will evaluate further and provide further recommendations.  Paroxysmal atrial fibrillation: Rate controlled-continue Eliquis.  Recent CVA: Continues to have a dense right-sided hemiparesis-continue Eliquis and Lipitor  Leukocytosis:Resolving and suspected to have been reactive. 19.9 to 13.0 in three days. Continues to be afebrile.  Acute kidney injury: Likely hemodynamically mediated, improving.  Continue to monitor.   Hypokalemia: Replete and recheck.  Hypothyroidism:Continue levothyroxine.  DVT prophylaxis: Eliquis Code Status: Full-code Family Communication: None at bedside Disposition Plan: Continue to monitor    Consultants:   Gastroenterology  Procedures:   Flex sig decompression (11/11/2017)  Antimicrobials:  None   Objective: Vitals:   11/11/17 1239 11/11/17 1351  11/11/17 2204 11/12/17 0616  BP: 92/71 116/82 110/72 122/80  Pulse: 67 66 75 76  Resp: 18 (!) 22 17 19   Temp: 98.6 F (37 C) 98 F (36.7 C) 98.5 F (36.9 C) 98.9 F (37.2 C)  TempSrc: Oral Oral    SpO2: 100% 100% 100% 98%  Weight:      Height:       No intake or output data in the 24 hours ending 11/12/17 0934 Filed Weights   11/09/17 1553  Weight: 95.9 kg    Examination:  General exam: Patient appears comfortable, NAD  Respiratory system: Mild wheezing auscultated on exam Cardiovascular system: S1 & S2 heard, RRR. No JVD, murmurs, rubs or gallops Gastrointestinal system: Abdomen is distended, soft. TTP epigastric. No rebound, no guarding. Normal bowel sounds heard. Central nervous system: Alert and oriented. No focal neurological deficits. Extremities: No pedal edema. Right-sided hemiparesis, dysarthria  Skin: No rashes, lesions or ulcers Psychiatry: Judgement and insight appear normal. A&O x 3. Mood & affect appropriate.    Data Reviewed: I have personally reviewed following labs and imaging studies  CBC: Recent Labs  Lab 11/08/17 1704 11/09/17 0642 11/10/17 0440 11/11/17 0528 11/12/17 0440  WBC 19.9* 16.5* 14.1* 13.0* 13.0*  HGB 14.6 13.9 12.8 12.8 13.0  HCT 45.4 43.9 39.9 39.9 41.7  MCV 93.4 93.8 93.2 94.5 96.1  PLT 389 352 343 330 496   Basic Metabolic Panel: Recent Labs  Lab 11/08/17 0649 11/08/17 1647 11/09/17 0642 11/09/17 1539 11/10/17 0440 11/11/17 0528 11/12/17 0440  NA 142 140 142  --  141 141 143  K 2.7* 3.6 3.1*  --  3.2* 3.9 3.8  CL 110 107 112*  --  115* 122* 122*  CO2 21* 20* 19*  --  17* 15* 14*  GLUCOSE 128* 127* 113*  --  122* 116* 102*  BUN 57* 55* 57*  --  43* 27* 19  CREATININE 2.17* 2.14* 2.09*  --  1.52* 1.31* 1.25*  CALCIUM 8.9 8.8* 9.0  --  8.5* 8.6* 8.9  MG 2.5*  --   --  2.5* 2.5* 2.4 2.3   GFR: Estimated Creatinine Clearance: 43.7 mL/min (A) (by C-G formula based on SCr of 1.25 mg/dL (H)). Liver Function Tests: No  results for input(s): AST, ALT, ALKPHOS, BILITOT, PROT, ALBUMIN in the last 168 hours. No results for input(s): LIPASE, AMYLASE in the last 168 hours. No results for input(s): AMMONIA in the last 168 hours. Coagulation Profile: No results for input(s): INR, PROTIME in the last 168 hours. Cardiac Enzymes: No results for input(s): CKTOTAL, CKMB, CKMBINDEX, TROPONINI in the last 168 hours. BNP (last 3 results) No results for input(s): PROBNP in the last 8760 hours. HbA1C: No results for input(s): HGBA1C in the last 72 hours. CBG: No results for input(s): GLUCAP in the last 168 hours. Lipid Profile: No results for input(s): CHOL, HDL, LDLCALC, TRIG, CHOLHDL, LDLDIRECT in the last 72 hours. Thyroid Function Tests: No results for input(s): TSH, T4TOTAL, FREET4, T3FREE, THYROIDAB in the last 72 hours. Anemia Panel: No results for input(s): VITAMINB12, FOLATE, FERRITIN, TIBC, IRON, RETICCTPCT in the last 72 hours. Sepsis Labs: No results for input(s): PROCALCITON, LATICACIDVEN in the last 168 hours.  Recent Results (from the past 240 hour(s))  Urine Culture     Status: Abnormal   Collection Time: 11/02/17 10:56 AM  Result Value Ref Range Status   Specimen Description URINE, CLEAN CATCH  Final   Special Requests   Final    NONE Performed at [facility], [address], [place], [place] [address]    Culture MULTIPLE SPECIES PRESENT, SUGGEST RECOLLECTION (A)  Final   Report Status 11/03/2017 FINAL  Final  Culture, Urine     Status: None   Collection Time: 11/04/17  3:57 AM  Result Value Ref Range Status   Specimen Description URINE, CATHETERIZED  Final   Special Requests NONE  Final   Culture   Final    NO GROWTH Performed at [facility], [place] [address], [place], [place] [address]    Report Status 11/05/2017 FINAL  Final  C difficile quick scan w PCR reflex     Status: None   Collection Time: 11/08/17  5:33 PM  Result Value Ref Range Status   C Diff antigen NEGATIVE  NEGATIVE Final   C Diff toxin NEGATIVE NEGATIVE Final   C Diff interpretation No C. difficile detected.  Final    Comment: Performed at [facility], [place] [address], [place], [place] [address]      Radiology Studies: Dg Abd Portable 1v  Result Date: 11/11/2017 CLINICAL DATA:  Follow-up abdominal distension EXAM: PORTABLE ABDOMEN - 1 VIEW COMPARISON:  11/11/2017 FINDINGS: Scattered large and small bowel gas is noted. Distension of the transverse colon is again seen with slightly less than that noted on the prior exam. No free air is seen. Postsurgical changes in the lower lumbar spine are noted. IMPRESSION: Persistent but slightly improved dilatation of the transverse colon. Electronically Signed   By: Inez Catalina M.D.   On: 11/11/2017 13:54   Dg Abd Portable 1v  Result Date: 11/11/2017 CLINICAL DATA:  Abdominal distension. EXAM: PORTABLE ABDOMEN - 1 VIEW COMPARISON:  Radiograph of November 10, 2017. FINDINGS: Stable dilated air-filled colon, particularly transverse colon, which measures 13.6 cm. No significant small bowel dilatation is noted. Postsurgical changes are seen in the  lumbar spine. Status post cholecystectomy. IMPRESSION: Stable dilated air-filled colon is noted, particularly transverse colon. Electronically Signed   By: Marijo Conception, M.D.   On: 11/11/2017 09:38      Scheduled Meds: . ALPRAZolam  1 mg Oral QHS  . apixaban  5 mg Oral BID  . atorvastatin  80 mg Oral Daily  . levothyroxine  50 mcg Oral QAC breakfast  . pantoprazole  40 mg Oral Daily  . polyethylene glycol  17 g Oral BID   Continuous Infusions:   LOS: 3 days    Time spent: 25 minutes  Nena Alexander MD Pager: Text Page via [email or web address]   If 7PM-7AM, please contact night-coverage [email or web address] 11/12/2017, 9:34 AM

## 2017-11-13 ENCOUNTER — Inpatient Hospital Stay (HOSPITAL_COMMUNITY): Payer: Medicare Other

## 2017-11-13 ENCOUNTER — Inpatient Hospital Stay (HOSPITAL_COMMUNITY): Payer: Medicare Other | Admitting: Occupational Therapy

## 2017-11-13 LAB — BASIC METABOLIC PANEL
ANION GAP: 4 — AB (ref 5–15)
BUN: 16 mg/dL (ref 8–23)
CHLORIDE: 121 mmol/L — AB (ref 98–111)
CO2: 15 mmol/L — ABNORMAL LOW (ref 22–32)
Calcium: 8.7 mg/dL — ABNORMAL LOW (ref 8.9–10.3)
Creatinine, Ser: 1.24 mg/dL — ABNORMAL HIGH (ref 0.44–1.00)
GFR calc non Af Amer: 43 mL/min — ABNORMAL LOW (ref >60)
GFR, EST AFRICAN AMERICAN: 49 mL/min — AB (ref >60)
GLUCOSE: 92 mg/dL (ref 70–99)
Potassium: 3.2 mmol/L — ABNORMAL LOW (ref 3.5–5.1)
SODIUM: 140 mmol/L (ref 135–145)

## 2017-11-13 LAB — CBC
HEMATOCRIT: 38.7 % (ref 36.0–46.0)
HEMOGLOBIN: 12.2 g/dL (ref 12.0–15.0)
MCH: 30 pg (ref 26.0–34.0)
MCHC: 31.5 g/dL (ref 30.0–36.0)
MCV: 95.1 fL (ref 78.0–100.0)
Platelets: 283 10*3/uL (ref 150–400)
RBC: 4.07 MIL/uL (ref 3.87–5.11)
RDW: 14.2 % (ref 11.5–15.5)
WBC: 13.4 10*3/uL — AB (ref 4.0–10.5)

## 2017-11-13 LAB — MAGNESIUM: MAGNESIUM: 2.2 mg/dL (ref 1.7–2.4)

## 2017-11-13 MED ORDER — BISACODYL 10 MG RE SUPP
10.0000 mg | Freq: Once | RECTAL | Status: AC
  Administered 2017-11-13: 10 mg via RECTAL
  Filled 2017-11-13: qty 1

## 2017-11-13 MED ORDER — POTASSIUM CHLORIDE 10 MEQ/100ML IV SOLN
10.0000 meq | INTRAVENOUS | Status: AC
  Administered 2017-11-13 (×4): 10 meq via INTRAVENOUS
  Filled 2017-11-13 (×3): qty 100

## 2017-11-13 MED ORDER — POTASSIUM CHLORIDE IN NACL 40-0.9 MEQ/L-% IV SOLN
INTRAVENOUS | Status: DC
  Administered 2017-11-13: 50 mL/h via INTRAVENOUS
  Filled 2017-11-13 (×2): qty 1000

## 2017-11-13 NOTE — Progress Notes (Signed)
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  Subjective:  Patient in bed, appears comfortable, denies any headache, no fever, no chest pain or pressure, no shortness of breath , no abdominal pain. No focal weakness.  Has passed some flatus today no nausea.     Assessment & Plan:  Colonic pseudoobstruction, consistent with Ogilve's syndrome/ileus: GI on board Case discussed with gastroenterologist Dr. Donalda Ewings on 11/13/2017, patient is passing some flatus and currently is not nauseated, abdominal x-ray noted, continue MiraLAX, added Dulcolax suppository, she had a liquid bowel movement last night according to the patient, continue to monitor electrolytes and monitor closely clinically.  Repeat KUB in the morning.  On liquid diet for now.  Paroxysmal atrial fibrillation: Rate controlled-continue Eliquis.  Recent CVA: Continues to have a dense right-sided hemiparesis-continue Eliquis and Lipitor  Leukocytosis:Resolving and suspected to have been reactive. 19.9 to 13.0 in three days. Continues to be afebrile.  Acute kidney injury: Likely hemodynamically mediated, improving.  Continue to monitor.   Hypokalemia: Least we will continue to monitor.  Hypothyroidism:Continue levothyroxine.    DVT prophylaxis: Eliquis Code Status: Full-code Family Communication: None at bedside Disposition Plan: Continue to monitor    Consultants:   Gastroenterology  Procedures:   Flex sig decompression (11/11/2017)  Antimicrobials:  None   Objective: Vitals:   11/12/17 0616 11/12/17 1329  11/12/17 2118 11/13/17 0424  BP: 122/80 118/74 116/73 122/81  Pulse: 76 86 64 74  Resp: 19  18 18   Temp: 98.9 F (37.2 C) 98 F (36.7 C) 98.3 F (36.8 C) 99 F (37.2 C)  TempSrc:   Oral Oral  SpO2: 98% 100% 99% 100%  Weight:      Height:        Intake/Output Summary (Last 24 hours) at 11/13/2017 1212 Last data filed at 11/13/2017 0518 Gross per 24 hour  Intake 320 ml  Output -  Net 320 ml   Filed Weights   11/09/17 1553  Weight: 95.9 kg    Examination:  Awake Alert, Oriented X 3, No new F.N deficits, right-sided weakness from previous stroke, Normal affect [place].AT,PERRAL Supple Neck,No JVD, No cervical lymphadenopathy appriciated.  Symmetrical Chest wall movement, Good air movement bilaterally, CTAB RRR,No Gallops, Rubs or new Murmurs, No Parasternal Heave +ve B.Sounds, Abd is distended, No tenderness, No organomegaly appriciated, No rebound - guarding or rigidity. No Cyanosis, Clubbing or edema, No new Rash or bruise     Data Reviewed: I have personally reviewed following labs and imaging studies  CBC: Recent Labs  Lab 11/09/17 0642 11/10/17 0440 11/11/17 0528 11/12/17 0440 11/13/17 0324  WBC 16.5* 14.1* 13.0* 13.0* 13.4*  HGB 13.9 12.8 12.8 13.0 12.2  HCT 43.9 39.9 39.9 41.7 38.7  MCV 93.8 93.2 94.5 96.1 95.1  PLT 352 343 330 323 497   Basic Metabolic Panel: Recent Labs  Lab 11/09/17 0642 11/09/17 1539 11/10/17 0440 11/11/17 0528 11/12/17 0440 11/13/17 0324  NA 142  --  141 141 143 140  K 3.1*  --  3.2* 3.9 3.8 3.2*  CL 112*  --  115* 122* 122* 121*  CO2 19*  --  17* 15* 14* 15*  GLUCOSE 113*  --  122* 116* 102* 92  BUN 57*  --  43* 27* 19 16  CREATININE 2.09*  --  1.52* 1.31* 1.25* 1.24*  CALCIUM 9.0  --  8.5* 8.6* 8.9 8.7*  MG  --  2.5* 2.5* 2.4 2.3 2.2   GFR: Estimated Creatinine Clearance: 44 mL/min (A) (by C-G formula based on SCr of 1.24 mg/dL (H)). Liver Function Tests: No results for input(s): AST, ALT, ALKPHOS, BILITOT, PROT, ALBUMIN  in the last 168 hours. No results for input(s): LIPASE, AMYLASE in the last 168 hours. No results for input(s): AMMONIA in the last 168 hours. Coagulation Profile: No results for input(s): INR, PROTIME in the last 168 hours. Cardiac Enzymes: No results for input(s): CKTOTAL, CKMB, CKMBINDEX, TROPONINI in the last 168 hours. BNP (last 3 results) No results for input(s): PROBNP in the last 8760 hours. HbA1C: No results for input(s): HGBA1C in the last 72 hours. CBG: No results for input(s): GLUCAP in the last 168 hours. Lipid Profile: No results for input(s): CHOL, HDL, LDLCALC, TRIG, CHOLHDL, LDLDIRECT in the last 72 hours. Thyroid Function Tests: No results for input(s): TSH, T4TOTAL, FREET4, T3FREE, THYROIDAB in the last 72 hours. Anemia Panel: No results for input(s): VITAMINB12, FOLATE, FERRITIN, TIBC, IRON, RETICCTPCT in the last 72 hours. Sepsis Labs: No results for input(s): PROCALCITON, LATICACIDVEN in the last 168 hours.  Recent Results (from the past 240 hour(s))  Culture, Urine     Status: None   Collection Time: 11/04/17  3:57 AM  Result Value Ref Range Status   Specimen Description URINE, CATHETERIZED  Final   Special Requests NONE  Final   Culture   Final    NO GROWTH Performed at [facility], [address], [place], [place] [address]    Report Status 11/05/2017 FINAL  Final  C difficile quick scan w PCR reflex     Status: None   Collection Time: 11/08/17  5:33 PM  Result Value Ref Range Status   C Diff antigen NEGATIVE NEGATIVE Final   C Diff toxin NEGATIVE NEGATIVE Final   C Diff interpretation No C. difficile detected.  Final    Comment: Performed at [facility], [place] [address] [place], [place] [address]      Radiology Studies: Dg Abd 1 View  Result Date: 11/12/2017 CLINICAL DATA:  Ogilvie's syndrome [id number], continued abd pain (complaints of bilat. upper abd pain today) EXAM: ABDOMEN - 1 VIEW COMPARISON:  11/11/2017 FINDINGS: There is  persistent significant dilatation of the transverse colon. No small bowel dilatation or evidence for free intraperitoneal air. Surgical clips are present in the RIGHT UPPER QUADRANT the abdomen. Remote lumbar spine fusion. IMPRESSION: Persistent gaseous distention of the colon. Electronically Signed   By: Nolon Nations M.D.   On: 11/12/2017 10:28   Dg Abd 2 Views  Result Date: 11/13/2017 CLINICAL DATA:  Abdominal pain and distension. EXAM: ABDOMEN - 2 VIEW COMPARISON:  11/12/2017, 11/10/2017 FINDINGS: There is persistent severe distension of the colon most severe in the transverse colon measuring 13.5 cm in diameter. There is no evidence of pneumoperitoneum, portal venous gas or pneumatosis. There are no pathologic calcifications along the expected course of the ureters. Posterior lumbar interbody fusion at L5-S1. IMPRESSION: 1. Persistent severe distension of the colon most severe in the transverse colon measuring 13.5 cm in diameter. Electronically Signed   By: Kathreen Devoid   On: 11/13/2017 10:59   Dg Abd Portable 1v  Result Date: 11/11/2017  CLINICAL DATA:  Follow-up abdominal distension EXAM: PORTABLE ABDOMEN - 1 VIEW COMPARISON:  11/11/2017 FINDINGS: Scattered large and small bowel gas is noted. Distension of the transverse colon is again seen with slightly less than that noted on the prior exam. No free air is seen. Postsurgical changes in the lower lumbar spine are noted. IMPRESSION: Persistent but slightly improved dilatation of the transverse colon. Electronically Signed   By: Inez Catalina M.D.   On: 11/11/2017 13:54      Scheduled Meds: . ALPRAZolam  1 mg Oral QHS  . apixaban  5 mg Oral BID  . atorvastatin  80 mg Oral Daily  . bisacodyl  10 mg Rectal Once  . levothyroxine  50 mcg Oral QAC breakfast  . pantoprazole  40 mg Oral Daily  . polyethylene glycol  17 g Oral BID   Continuous Infusions: . 0.9 % NaCl with KCl 40 mEq / L    . potassium chloride 10 mEq (11/13/17 1148)      LOS: 4 days    Time spent: 25 minutes  Signature  Lala Lund M.D on 11/13/2017 at 12:12 PM  To page go to [email or web address] - password [email or web address]   \

## 2017-11-13 NOTE — Progress Notes (Signed)
Subjective: The patient was seen at bedside, she was on a bedside commote. She had a large BM yesterday afternoon-described as liquid and mucous, no solid component.  Objective: Vital signs in last 24 hours: Temp:  [98 F (36.7 C)-99 F (37.2 C)] 99 F (37.2 C) (09/28 0424) Pulse Rate:  [64-86] 74 (09/28 0424) Resp:  [18] 18 (09/28 0424) BP: (116-122)/(73-81) 122/81 (09/28 0424) SpO2:  [99 %-100 %] 100 % (09/28 0424) Weight change:  Last BM Date: 11/12/17  CX:KGYJE, on oxygen by nasal cannula GENERAL:appears mildly short of breath, not using accessory muscles of respiration, the patient including but not limited ABDOMEN:appears distended EXTREMITIES:no deformity, right sided weakness from stroke  Lab Results: Results for orders placed or performed during the hospital encounter of 11/09/17 (from the past 48 hour(s))  CBC     Status: Abnormal   Collection Time: 11/12/17  4:40 AM  Result Value Ref Range   WBC 13.0 (H) 4.0 - 10.5 K/uL   RBC 4.34 3.87 - 5.11 MIL/uL   Hemoglobin 13.0 12.0 - 15.0 g/dL   HCT 41.7 36.0 - 46.0 %   MCV 96.1 78.0 - 100.0 fL   MCH 30.0 26.0 - 34.0 pg   MCHC 31.2 30.0 - 36.0 g/dL   RDW 14.4 11.5 - 15.5 %   Platelets 323 150 - 400 K/uL    Comment: Performed at [facility], [place] [address], [place], [place] [address]  Basic metabolic panel     Status: Abnormal   Collection Time: 11/12/17  4:40 AM  Result Value Ref Range   Sodium 143 135 - 145 mmol/L   Potassium 3.8 3.5 - 5.1 mmol/L   Chloride 122 (H) 98 - 111 mmol/L   CO2 14 (L) 22 - 32 mmol/L   Glucose, Bld 102 (H) 70 - 99 mg/dL   BUN 19 8 - 23 mg/dL   Creatinine, Ser 1.25 (H) 0.44 - 1.00 mg/dL   Calcium 8.9 8.9 - 10.3 mg/dL   GFR calc non Af Amer 42 (L) >60 mL/min   GFR calc Af Amer 49 (L) >60 mL/min    Comment: (NOTE) The eGFR has been calculated using the CKD EPI equation. This calculation has not been validated in all clinical situations. eGFR's persistently <60 mL/min signify possible  Chronic Kidney Disease.    Anion gap 7 5 - 15    Comment: Performed at [facility] [address], [place], [place] [address]  Magnesium     Status: None   Collection Time: 11/12/17  4:40 AM  Result Value Ref Range   Magnesium 2.3 1.7 - 2.4 mg/dL    Comment: Performed at [facility] [address], [place], [place] [address]  CBC     Status: Abnormal   Collection Time: 11/13/17  3:24 AM  Result Value Ref Range   WBC 13.4 (H) 4.0 - 10.5 K/uL   RBC 4.07 3.87 - 5.11 MIL/uL   Hemoglobin 12.2 12.0 - 15.0 g/dL   HCT 38.7 36.0 - 46.0 %   MCV 95.1 78.0 - 100.0 fL   MCH 30.0 26.0 - 34.0 pg   MCHC 31.5 30.0 - 36.0 g/dL   RDW 14.2 11.5 - 15.5 %   Platelets 283 150 - 400 K/uL    Comment: Performed at [facility], [place] [address], [place], [place] [address]  Basic metabolic panel     Status: Abnormal   Collection Time: 11/13/17  3:24 AM  Result  Value Ref Range   Sodium 140 135 - 145 mmol/L   Potassium 3.2 (L) 3.5 - 5.1 mmol/L   Chloride 121 (H) 98 - 111 mmol/L   CO2 15 (L) 22 - 32 mmol/L   Glucose, Bld 92 70 - 99 mg/dL   BUN 16 8 - 23 mg/dL   Creatinine, Ser 1.24 (H) 0.44 - 1.00 mg/dL   Calcium 8.7 (L) 8.9 - 10.3 mg/dL   GFR calc non Af Amer 43 (L) >60 mL/min   GFR calc Af Amer 49 (L) >60 mL/min    Comment: (NOTE) The eGFR has been calculated using the CKD EPI equation. This calculation has not been validated in all clinical situations. eGFR's persistently <60 mL/min signify possible Chronic Kidney Disease.    Anion gap 4 (L) 5 - 15    Comment: Performed at [facility] [address] [place], [place] [address]  Magnesium     Status: None   Collection Time: 11/13/17  3:24 AM  Result Value Ref Range   Magnesium 2.2 1.7 - 2.4 mg/dL    Comment: Performed at [facility] [address], [place], [place] [address]    Studies/Results: Dg Abd 1 View  Result Date: 11/12/2017 CLINICAL DATA:  Ogilvie's syndrome [id number], continued abd pain (complaints  of bilat. upper abd pain today) EXAM: ABDOMEN - 1 VIEW COMPARISON:  11/11/2017 FINDINGS: There is persistent significant dilatation of the transverse colon. No small bowel dilatation or evidence for free intraperitoneal air. Surgical clips are present in the RIGHT UPPER QUADRANT the abdomen. Remote lumbar spine fusion. IMPRESSION: Persistent gaseous distention of the colon. Electronically Signed   By: Nolon Nations M.D.   On: 11/12/2017 10:28   Dg Abd Portable 1v  Result Date: 11/11/2017 CLINICAL DATA:  Follow-up abdominal distension EXAM: PORTABLE ABDOMEN - 1 VIEW COMPARISON:  11/11/2017 FINDINGS: Scattered large and small bowel gas is noted. Distension of the transverse colon is again seen with slightly less than that noted on the prior exam. No free air is seen. Postsurgical changes in the lower lumbar spine are noted. IMPRESSION: Persistent but slightly improved dilatation of the transverse colon. Electronically Signed   By: Inez Catalina M.D.   On: 11/11/2017 13:54    Medications: I have reviewed the patient's current medications.  Assessment: 1.Persistent significant dilatation of transverse colon, likely related to Ogilvie's syndrome Flexible sigmoidoscopy with aspiration of over 1 L of liquid stool and decompression performed on 11/11/17 Hypokalemia, potassium 3.2 today  2.Paroxysmal atrial fibrillation 3. CVA with resultant right-sided hemiparesis( 10/29/17) 4. Hypertension, hypothyroidism   Plan: Agree with aggressive potassium replacement,patient to receive 4 doses of  10 mEq potassium chloride intravenously. Will likely need several more runs of IV potassium. Plan to keep potassium at least above 4. Replace magnesium if needed. Continue MiraLAX twice a day.  If Neostigmine(usually given 0.5 mg intramuscular or intravenous over 5 minutes with cardiac monitoring) is to be considered-will need cardiology clearance as patient has underlying atrial fibrillation, and remains at high  risk for arrhythmia/hypotension/bronchospasm and respiratory depression with use of neostigmine.  Patient currently not on opiates. Difficult to ambulate patient because of dense right-sided hemiparesis. Repeat x-ray in a.m. May consider repeating sigmoidoscopy for further colonic decompression if x-ray shows worsening  Colonic dilatation.   Ronnette Juniper 11/13/2017, 9:34 AM   Pager [phone] If no answer or after 5 PM call [phone]

## 2017-11-13 NOTE — Progress Notes (Signed)
Social Work Discharge Note  The overall goal for the admission was met for:   Discharge location: No - Pt required tx back to acute prior to completing her rehab program  Length of Stay: No - 8 days  Discharge activity level: No - tx to acute  Home/community participation: No  Services provided included: MD, RD, PT, OT, SLP, RN, Pharmacy and SW  Financial Services: Medicare and Other: [facility]  Follow-up services arranged: Other: Pt with medical complications requiring tx back to acute hospital.  Comments (or additional information): Pt had not yet completed her Rehab program and would not be ready to d/c home at her current functional level.  Team feels pt would benefit by returning to CIR.  Patient/Family verbalized understanding of follow-up arrangements: Yes  Individual responsible for coordination of the follow-up plan: pt's medical team and family  Confirmed correct DME delivered: Prevatt, Jennifer Capps 11/13/2017    Prevatt, Jennifer Capps 

## 2017-11-14 ENCOUNTER — Inpatient Hospital Stay (HOSPITAL_COMMUNITY): Payer: Medicare Other | Admitting: Occupational Therapy

## 2017-11-14 ENCOUNTER — Inpatient Hospital Stay (HOSPITAL_COMMUNITY): Payer: Medicare Other

## 2017-11-14 LAB — CBC
HCT: 37.7 % (ref 36.0–46.0)
Hemoglobin: 11.9 g/dL — ABNORMAL LOW (ref 12.0–15.0)
MCH: 30 pg (ref 26.0–34.0)
MCHC: 31.6 g/dL (ref 30.0–36.0)
MCV: 95 fL (ref 78.0–100.0)
PLATELETS: 248 10*3/uL (ref 150–400)
RBC: 3.97 MIL/uL (ref 3.87–5.11)
RDW: 14.2 % (ref 11.5–15.5)
WBC: 10.8 10*3/uL — ABNORMAL HIGH (ref 4.0–10.5)

## 2017-11-14 LAB — BASIC METABOLIC PANEL
Anion gap: 4 — ABNORMAL LOW (ref 5–15)
BUN: 14 mg/dL (ref 8–23)
CHLORIDE: 119 mmol/L — AB (ref 98–111)
CO2: 16 mmol/L — ABNORMAL LOW (ref 22–32)
CREATININE: 1.2 mg/dL — AB (ref 0.44–1.00)
Calcium: 8.6 mg/dL — ABNORMAL LOW (ref 8.9–10.3)
GFR calc Af Amer: 51 mL/min — ABNORMAL LOW (ref >60)
GFR, EST NON AFRICAN AMERICAN: 44 mL/min — AB (ref >60)
Glucose, Bld: 92 mg/dL (ref 70–99)
Potassium: 3.4 mmol/L — ABNORMAL LOW (ref 3.5–5.1)
SODIUM: 139 mmol/L (ref 135–145)

## 2017-11-14 LAB — MAGNESIUM: Magnesium: 2.1 mg/dL (ref 1.7–2.4)

## 2017-11-14 MED ORDER — POTASSIUM CHLORIDE 20 MEQ/15ML (10%) PO SOLN
40.0000 meq | Freq: Once | ORAL | Status: AC
  Administered 2017-11-14: 40 meq via ORAL
  Filled 2017-11-14: qty 30

## 2017-11-14 MED ORDER — POTASSIUM CHLORIDE 10 MEQ/100ML IV SOLN
10.0000 meq | Freq: Once | INTRAVENOUS | Status: AC
  Administered 2017-11-14: 10 meq via INTRAVENOUS
  Filled 2017-11-14: qty 100

## 2017-11-14 MED ORDER — POTASSIUM CHLORIDE 10 MEQ/100ML IV SOLN
INTRAVENOUS | Status: AC
  Administered 2017-11-14: 10 meq via INTRAVENOUS
  Filled 2017-11-14: qty 100

## 2017-11-14 MED ORDER — BISACODYL 10 MG RE SUPP
10.0000 mg | Freq: Every day | RECTAL | Status: DC
  Administered 2017-11-14 – 2017-11-15 (×2): 10 mg via RECTAL
  Filled 2017-11-14 (×2): qty 1

## 2017-11-14 MED ORDER — POTASSIUM CHLORIDE CRYS ER 20 MEQ PO TBCR
40.0000 meq | EXTENDED_RELEASE_TABLET | Freq: Once | ORAL | Status: AC
  Administered 2017-11-14: 40 meq via ORAL
  Filled 2017-11-14: qty 2

## 2017-11-14 MED ORDER — POTASSIUM CHLORIDE 10 MEQ/100ML IV SOLN
10.0000 meq | INTRAVENOUS | Status: AC
  Administered 2017-11-14 (×3): 10 meq via INTRAVENOUS
  Filled 2017-11-14 (×2): qty 100

## 2017-11-14 MED ORDER — POTASSIUM CHLORIDE IN NACL 40-0.9 MEQ/L-% IV SOLN
INTRAVENOUS | Status: DC
  Filled 2017-11-14: qty 1000

## 2017-11-14 MED ORDER — FUROSEMIDE 10 MG/ML IJ SOLN
40.0000 mg | Freq: Once | INTRAMUSCULAR | Status: AC
  Administered 2017-11-14: 40 mg via INTRAVENOUS
  Filled 2017-11-14: qty 4

## 2017-11-14 NOTE — Progress Notes (Signed)
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  Subjective:  Patient in bed, appears comfortable, denies any headache, no fever, no chest pain or pressure, no shortness of breath , no abdominal pain. No focal weakness.   Assessment & Plan:  Colonic pseudoobstruction, consistent with Ogilve's syndrome/ileus: GI on board Case discussed with gastroenterologist Dr. Donalda Ewings on 11/13/2017, patient has been placed on scheduled MiraLAX and Dulcolax suppositories with good effect, she has had 2 liquid bowel movements in the last 2 days last being on 11/14/2017, physical exam shows improvement in abdominal distention, she has no pain or nausea, continue bowel regimen, continue to monitor electrolytes closely, encouraged to increase activity, if stable likely discharge tomorrow.  Paroxysmal atrial fibrillation: Rate controlled-continue Eliquis.  Recent CVA: Continues to have a dense right-sided hemiparesis-continue Eliquis and Lipitor  Leukocytosis:Resolving and suspected to have been reactive. 19.9 to 13.0 in three days. Continues to be afebrile.  Acute kidney injury: Likely hemodynamically mediated, improving.  Continue to monitor.   Hypokalemia: Aggressively replaced will continue to monitor  Hypothyroidism: Continue levothyroxine.    DVT prophylaxis: Eliquis Code Status: Full-code Family Communication: None at bedside Disposition Plan: Continue to monitor    Consultants:   Gastroenterology  Procedures:   Flex sig decompression  (11/11/2017)  Antimicrobials:  None   Objective: Vitals:   11/13/17 0424 11/13/17 1449 11/13/17 2051 11/14/17 0451  BP: 122/81 119/74 109/69 105/71  Pulse: 74 66 62   Resp: 18  18 18   Temp: 99 F (37.2 C) (!) 97.5 F (36.4 C) 98.4 F (36.9 C) 98.1 F (36.7 C)  TempSrc: Oral Oral  Oral  SpO2: 100% 100% 99% 100%  Weight:      Height:        Intake/Output Summary (Last 24 hours) at 11/14/2017 0916 Last data filed at 11/14/2017 0631 Gross per 24 hour  Intake 1090.19 ml  Output -  Net 1090.19 ml   Filed Weights   11/09/17 1553  Weight: 95.9 kg    Examination:  Awake Alert, Oriented X 3, right-sided hemiparesis due to recent stroke  [place].AT,PERRAL Supple Neck,No JVD, No cervical lymphadenopathy appriciated.  Symmetrical Chest wall movement, Good air movement bilaterally, CTAB RRR,No Gallops, Rubs or new Murmurs, No Parasternal Heave +ve B.Sounds, Abd Soft and less distended, No tenderness, No organomegaly appriciated, No rebound - guarding or rigidity. No Cyanosis, Clubbing or edema, No new Rash or bruise      Data Reviewed: I have personally reviewed following labs and imaging studies  CBC: Recent Labs  Lab 11/10/17 0440 11/11/17 0528 11/12/17 0440 11/13/17 0324 11/14/17 0415  WBC 14.1* 13.0* 13.0* 13.4* 10.8*  HGB 12.8 12.8 13.0 12.2 11.9*  HCT 39.9 39.9 41.7 38.7 37.7  MCV 93.2 94.5 96.1 95.1 95.0  PLT 343 330 323 283 725   Basic Metabolic Panel: Recent Labs  Lab 11/10/17 0440 11/11/17 0528 11/12/17 0440 11/13/17 0324 11/14/17 0415  NA 141 141 143 140 139  K 3.2* 3.9 3.8 3.2* 3.4*  CL 115* 122* 122* 121* 119*  CO2 17* 15* 14* 15* 16*  GLUCOSE 122* 116* 102* 92 92  BUN 43* 27* 19 16 14  CREATININE 1.52* 1.31* 1.25* 1.24* 1.20*  CALCIUM 8.5* 8.6* 8.9 8.7* 8.6*  MG 2.5* 2.4 2.3 2.2 2.1   GFR: Estimated Creatinine Clearance: 45.5 mL/min (A) (by C-G formula based on SCr of 1.2 mg/dL (H)). Liver Function Tests: No results for input(s): AST,  ALT, ALKPHOS, BILITOT, PROT, ALBUMIN in the last 168 hours. No results for input(s): LIPASE, AMYLASE in the last 168 hours. No results for input(s): AMMONIA in the last 168 hours. Coagulation Profile: No results for input(s): INR, PROTIME in the last 168 hours. Cardiac Enzymes: No results for input(s): CKTOTAL, CKMB, CKMBINDEX, TROPONINI in the last 168 hours. BNP (last 3 results) No results for input(s): PROBNP in the last 8760 hours. HbA1C: No results for input(s): HGBA1C in the last 72 hours. CBG: No results for input(s): GLUCAP in the last 168 hours. Lipid Profile: No results for input(s): CHOL, HDL, LDLCALC, TRIG, CHOLHDL, LDLDIRECT in the last 72 hours. Thyroid Function Tests: No results for input(s): TSH, T4TOTAL, FREET4, T3FREE, THYROIDAB in the last 72 hours. Anemia Panel: No results for input(s): VITAMINB12, FOLATE, FERRITIN, TIBC, IRON, RETICCTPCT in the last 72 hours. Sepsis Labs: No results for input(s): PROCALCITON, LATICACIDVEN in the last 168 hours.  Recent Results (from the past 240 hour(s))  C difficile quick scan w PCR reflex     Status: None   Collection Time: 11/08/17  5:33 PM  Result Value Ref Range Status   C Diff antigen NEGATIVE NEGATIVE Final   C Diff toxin NEGATIVE NEGATIVE Final   C Diff interpretation No C. difficile detected.  Final    Comment: Performed at [facility], [place] [address] [place], [place] [address]      Radiology Studies: Dg Abd 1 View  Result Date: 11/12/2017 CLINICAL DATA:  Ogilvie's syndrome [id number], continued abd pain (complaints of bilat. upper abd pain today) EXAM: ABDOMEN - 1 VIEW COMPARISON:  11/11/2017 FINDINGS: There is persistent significant dilatation of the transverse colon. No small bowel dilatation or evidence for free intraperitoneal air. Surgical clips are present in the RIGHT UPPER QUADRANT the abdomen. Remote lumbar spine fusion. IMPRESSION: Persistent gaseous distention of the colon. Electronically Signed   By:  Nolon Nations M.D.   On: 11/12/2017 10:28   Dg Abd 2 Views  Result Date: 11/13/2017 CLINICAL DATA:  Abdominal pain and distension. EXAM: ABDOMEN - 2 VIEW COMPARISON:  11/12/2017, 11/10/2017 FINDINGS: There is persistent severe distension of the colon most severe in the transverse colon measuring 13.5 cm in diameter. There is no evidence of pneumoperitoneum, portal venous gas or pneumatosis. There are no pathologic calcifications along the expected course of the ureters. Posterior lumbar interbody fusion at L5-S1. IMPRESSION: 1. Persistent severe distension of the colon most severe in the transverse colon measuring 13.5 cm in diameter. Electronically Signed   By: Kathreen Devoid   On: 11/13/2017 10:59    Scheduled Meds: . ALPRAZolam  1 mg Oral QHS  . apixaban  5 mg Oral BID  . atorvastatin  80 mg Oral Daily  . bisacodyl  10 mg Rectal Daily  . levothyroxine  50 mcg Oral QAC breakfast  . pantoprazole  40 mg Oral Daily  . polyethylene glycol  17 g Oral BID   Continuous Infusions: . 0.9 % NaCl with KCl 40 mEq / L    . potassium chloride 10 mEq (11/14/17 0857)     LOS: 5 days    Time spent: 25 minutes  Signature  Lala Lund M.D on 11/14/2017 at 9:16  AM  To page go to [email or web address] - password [facility]

## 2017-11-14 NOTE — Progress Notes (Signed)
Subjective: Patient was seen and examined at bedside. She reports having liquid stools yesterday and today morning. She is currently on full liquid diet along with MiraLAX twice a day and Dulcolax suppositories for 3 doses.  Objective: Vital signs in last 24 hours: Temp:  [97.5 F (36.4 C)-98.4 F (36.9 C)] 98.1 F (36.7 C) (09/29 0451) Pulse Rate:  [62-66] 62 (09/28 2051) Resp:  [18] 18 (09/29 0451) BP: (105-119)/(69-74) 105/71 (09/29 0451) SpO2:  [99 %-100 %] 100 % (09/29 0451) Weight change:  Last BM Date: 11/13/17  UD:JSHFW on bed, no pallor, no icterus GENERAL:right-sided hemiparesis ABDOMEN:appears distended more in the upper abdomen, tympanitic to percussion, sluggish intermittent high-pitched bowel sounds EXTREMITIES:right-sided hemiparesis  Lab Results: Results for orders placed or performed during the hospital encounter of 11/09/17 (from the past 48 hour(s))  CBC     Status: Abnormal   Collection Time: 11/13/17  3:24 AM  Result Value Ref Range   WBC 13.4 (H) 4.0 - 10.5 K/uL   RBC 4.07 3.87 - 5.11 MIL/uL   Hemoglobin 12.2 12.0 - 15.0 g/dL   HCT 38.7 36.0 - 46.0 %   MCV 95.1 78.0 - 100.0 fL   MCH 30.0 26.0 - 34.0 pg   MCHC 31.5 30.0 - 36.0 g/dL   RDW 14.2 11.5 - 15.5 %   Platelets 283 150 - 400 K/uL    Comment: Performed at [facility], [place] [address], [place], [place] [address]  Basic metabolic panel     Status: Abnormal   Collection Time: 11/13/17  3:24 AM  Result Value Ref Range   Sodium 140 135 - 145 mmol/L   Potassium 3.2 (L) 3.5 - 5.1 mmol/L   Chloride 121 (H) 98 - 111 mmol/L   CO2 15 (L) 22 - 32 mmol/L   Glucose, Bld 92 70 - 99 mg/dL   BUN 16 8 - 23 mg/dL   Creatinine, Ser 1.24 (H) 0.44 - 1.00 mg/dL   Calcium 8.7 (L) 8.9 - 10.3 mg/dL   GFR calc non Af Amer 43 (L) >60 mL/min   GFR calc Af Amer 49 (L) >60 mL/min    Comment: (NOTE) The eGFR has been calculated using the CKD EPI equation. This calculation has not been validated in all clinical  situations. eGFR's persistently <60 mL/min signify possible Chronic Kidney Disease.    Anion gap 4 (L) 5 - 15    Comment: Performed at [facility] [address], [place], [place] [address]  Magnesium     Status: None   Collection Time: 11/13/17  3:24 AM  Result Value Ref Range   Magnesium 2.2 1.7 - 2.4 mg/dL    Comment: Performed at [facility] [address], [place], [place] [address]  CBC     Status: Abnormal   Collection Time: 11/14/17  4:15 AM  Result Value Ref Range   WBC 10.8 (H) 4.0 - 10.5 K/uL   RBC 3.97 3.87 - 5.11 MIL/uL   Hemoglobin 11.9 (L) 12.0 - 15.0 g/dL   HCT 37.7 36.0 - 46.0 %   MCV 95.0 78.0 - 100.0 fL   MCH 30.0 26.0 - 34.0 pg   MCHC 31.6 30.0 - 36.0 g/dL   RDW 14.2 11.5 - 15.5 %   Platelets 248 150 - 400 K/uL    Comment: Performed at [facility], [place] [address], [place], [place] [address]  Basic metabolic panel     Status: Abnormal   Collection Time:  11/14/17  4:15 AM  Result Value Ref Range   Sodium 139 135 - 145 mmol/L   Potassium 3.4 (L) 3.5 - 5.1 mmol/L   Chloride 119 (H) 98 - 111 mmol/L   CO2 16 (L) 22 - 32 mmol/L   Glucose, Bld 92 70 - 99 mg/dL   BUN 14 8 - 23 mg/dL   Creatinine, Ser 1.20 (H) 0.44 - 1.00 mg/dL   Calcium 8.6 (L) 8.9 - 10.3 mg/dL   GFR calc non Af Amer 44 (L) >60 mL/min   GFR calc Af Amer 51 (L) >60 mL/min    Comment: (NOTE) The eGFR has been calculated using the CKD EPI equation. This calculation has not been validated in all clinical situations. eGFR's persistently <60 mL/min signify possible Chronic Kidney Disease.    Anion gap 4 (L) 5 - 15    Comment: Performed at [facility] [address] [place], [place] [address]  Magnesium     Status: None   Collection Time: 11/14/17  4:15 AM  Result Value Ref Range   Magnesium 2.1 1.7 - 2.4 mg/dL    Comment: Performed at [facility] [address], [place], [place] [address]    Studies/Results: Dg Abd 1 View  Result Date:  11/12/2017 CLINICAL DATA:  Ogilvie's syndrome [id number], continued abd pain (complaints of bilat. upper abd pain today) EXAM: ABDOMEN - 1 VIEW COMPARISON:  11/11/2017 FINDINGS: There is persistent significant dilatation of the transverse colon. No small bowel dilatation or evidence for free intraperitoneal air. Surgical clips are present in the RIGHT UPPER QUADRANT the abdomen. Remote lumbar spine fusion. IMPRESSION: Persistent gaseous distention of the colon. Electronically Signed   By: Nolon Nations M.D.   On: 11/12/2017 10:28   Dg Abd 2 Views  Result Date: 11/13/2017 CLINICAL DATA:  Abdominal pain and distension. EXAM: ABDOMEN - 2 VIEW COMPARISON:  11/12/2017, 11/10/2017 FINDINGS: There is persistent severe distension of the colon most severe in the transverse colon measuring 13.5 cm in diameter. There is no evidence of pneumoperitoneum, portal venous gas or pneumatosis. There are no pathologic calcifications along the expected course of the ureters. Posterior lumbar interbody fusion at L5-S1. IMPRESSION: 1. Persistent severe distension of the colon most severe in the transverse colon measuring 13.5 cm in diameter. Electronically Signed   By: Kathreen Devoid   On: 11/13/2017 10:59    Medications: I have reviewed the patient's current medications.  Assessment: 1.Severe distention of the colon most severe in the transverse colon consistent with Ogilve's syndrome X-ray performed today, results pending. She reports bowel movements yesterday and today morning however abdomen still appears distended on clinical exam.  2. Persistent hypokalemia potassium 3.2 yesterday and 3.4 today 3. Difficult to ambulate because of recent stroke and right-sided hemiparesis 4. Paroxysmal atrial fibrillation, recent CVA  Plan: Continue aggressive potassium replacement, IV and by mouth, with the intention to keep potassium at least above 4 and around 4.5. Avoid diuretics as possible to avoid worsening  hypokalemia. Continue MiraLAX and Dulcolax for now. Repeat abdominal x-ray in a.m., if no improvement noted or worsening, may need repeat colonic decompression. Not a good candidate for neostigmine due to underlying atrial fibrillation as there is high risk of arrhythmia/hypotension/bronchospasm/respiratory depression. If all measures fail, may need neostigmine, 0.5 mg IM or IV over 5 minutes with cardiac monitoring.     Ronnette Juniper 11/14/2017, 10:01 AM   Pager [phone] If no answer or after 5 PM call [phone]

## 2017-11-15 ENCOUNTER — Inpatient Hospital Stay (HOSPITAL_COMMUNITY)
Admission: RE | Admit: 2017-11-15 | Discharge: 2017-12-08 | DRG: 057 | Disposition: A | Discharge status: Home Health | Payer: Medicare Other | Source: Intra-hospital | Attending: Physical Medicine & Rehabilitation | Admitting: Physical Medicine & Rehabilitation

## 2017-11-15 ENCOUNTER — Other Ambulatory Visit: Payer: Self-pay

## 2017-11-15 ENCOUNTER — Encounter (HOSPITAL_COMMUNITY): Payer: Self-pay | Admitting: *Deleted

## 2017-11-15 DIAGNOSIS — K219 Gastro-esophageal reflux disease without esophagitis: Secondary | ICD-10-CM | POA: Diagnosis present

## 2017-11-15 DIAGNOSIS — E46 Unspecified protein-calorie malnutrition: Secondary | ICD-10-CM

## 2017-11-15 DIAGNOSIS — Z79899 Other long term (current) drug therapy: Secondary | ICD-10-CM

## 2017-11-15 DIAGNOSIS — Z961 Presence of intraocular lens: Secondary | ICD-10-CM | POA: Diagnosis present

## 2017-11-15 DIAGNOSIS — F419 Anxiety disorder, unspecified: Secondary | ICD-10-CM | POA: Diagnosis present

## 2017-11-15 DIAGNOSIS — Z9842 Cataract extraction status, left eye: Secondary | ICD-10-CM | POA: Diagnosis not present

## 2017-11-15 DIAGNOSIS — I10 Essential (primary) hypertension: Secondary | ICD-10-CM

## 2017-11-15 DIAGNOSIS — G4730 Sleep apnea, unspecified: Secondary | ICD-10-CM | POA: Diagnosis present

## 2017-11-15 DIAGNOSIS — R0989 Other specified symptoms and signs involving the circulatory and respiratory systems: Secondary | ICD-10-CM | POA: Diagnosis not present

## 2017-11-15 DIAGNOSIS — Z9049 Acquired absence of other specified parts of digestive tract: Secondary | ICD-10-CM

## 2017-11-15 DIAGNOSIS — E8809 Other disorders of plasma-protein metabolism, not elsewhere classified: Secondary | ICD-10-CM | POA: Diagnosis present

## 2017-11-15 DIAGNOSIS — J449 Chronic obstructive pulmonary disease, unspecified: Secondary | ICD-10-CM | POA: Diagnosis present

## 2017-11-15 DIAGNOSIS — K598 Other specified functional intestinal disorders: Secondary | ICD-10-CM | POA: Diagnosis present

## 2017-11-15 DIAGNOSIS — I69351 Hemiplegia and hemiparesis following cerebral infarction affecting right dominant side: Principal | ICD-10-CM

## 2017-11-15 DIAGNOSIS — N182 Chronic kidney disease, stage 2 (mild): Secondary | ICD-10-CM

## 2017-11-15 DIAGNOSIS — I725 Aneurysm of other precerebral arteries: Secondary | ICD-10-CM | POA: Diagnosis present

## 2017-11-15 DIAGNOSIS — I130 Hypertensive heart and chronic kidney disease with heart failure and stage 1 through stage 4 chronic kidney disease, or unspecified chronic kidney disease: Secondary | ICD-10-CM | POA: Diagnosis present

## 2017-11-15 DIAGNOSIS — Z8249 Family history of ischemic heart disease and other diseases of the circulatory system: Secondary | ICD-10-CM

## 2017-11-15 DIAGNOSIS — I480 Paroxysmal atrial fibrillation: Secondary | ICD-10-CM

## 2017-11-15 DIAGNOSIS — K567 Ileus, unspecified: Secondary | ICD-10-CM

## 2017-11-15 DIAGNOSIS — E039 Hypothyroidism, unspecified: Secondary | ICD-10-CM | POA: Diagnosis present

## 2017-11-15 DIAGNOSIS — D62 Acute posthemorrhagic anemia: Secondary | ICD-10-CM | POA: Diagnosis present

## 2017-11-15 DIAGNOSIS — G43919 Migraine, unspecified, intractable, without status migrainosus: Secondary | ICD-10-CM | POA: Diagnosis present

## 2017-11-15 DIAGNOSIS — G8101 Flaccid hemiplegia affecting right dominant side: Secondary | ICD-10-CM

## 2017-11-15 DIAGNOSIS — N183 Chronic kidney disease, stage 3 unspecified: Secondary | ICD-10-CM

## 2017-11-15 DIAGNOSIS — Z90710 Acquired absence of both cervix and uterus: Secondary | ICD-10-CM

## 2017-11-15 DIAGNOSIS — G8929 Other chronic pain: Secondary | ICD-10-CM | POA: Diagnosis present

## 2017-11-15 DIAGNOSIS — I6300 Cerebral infarction due to thrombosis of unspecified precerebral artery: Secondary | ICD-10-CM | POA: Diagnosis not present

## 2017-11-15 DIAGNOSIS — I5032 Chronic diastolic (congestive) heart failure: Secondary | ICD-10-CM | POA: Diagnosis present

## 2017-11-15 DIAGNOSIS — I639 Cerebral infarction, unspecified: Secondary | ICD-10-CM | POA: Diagnosis not present

## 2017-11-15 DIAGNOSIS — Z9841 Cataract extraction status, right eye: Secondary | ICD-10-CM

## 2017-11-15 DIAGNOSIS — F329 Major depressive disorder, single episode, unspecified: Secondary | ICD-10-CM

## 2017-11-15 DIAGNOSIS — Z7901 Long term (current) use of anticoagulants: Secondary | ICD-10-CM

## 2017-11-15 DIAGNOSIS — I252 Old myocardial infarction: Secondary | ICD-10-CM

## 2017-11-15 DIAGNOSIS — I69322 Dysarthria following cerebral infarction: Secondary | ICD-10-CM

## 2017-11-15 DIAGNOSIS — E785 Hyperlipidemia, unspecified: Secondary | ICD-10-CM | POA: Diagnosis present

## 2017-11-15 DIAGNOSIS — Z882 Allergy status to sulfonamides status: Secondary | ICD-10-CM

## 2017-11-15 DIAGNOSIS — Z96651 Presence of right artificial knee joint: Secondary | ICD-10-CM | POA: Diagnosis present

## 2017-11-15 DIAGNOSIS — Z880 Allergy status to penicillin: Secondary | ICD-10-CM

## 2017-11-15 DIAGNOSIS — E876 Hypokalemia: Secondary | ICD-10-CM

## 2017-11-15 DIAGNOSIS — Z79890 Hormone replacement therapy (postmenopausal): Secondary | ICD-10-CM

## 2017-11-15 DIAGNOSIS — Z823 Family history of stroke: Secondary | ICD-10-CM

## 2017-11-15 DIAGNOSIS — Z87891 Personal history of nicotine dependence: Secondary | ICD-10-CM

## 2017-11-15 DIAGNOSIS — R140 Abdominal distension (gaseous): Secondary | ICD-10-CM

## 2017-11-15 DIAGNOSIS — Z833 Family history of diabetes mellitus: Secondary | ICD-10-CM

## 2017-11-15 DIAGNOSIS — I63429 Cerebral infarction due to embolism of unspecified anterior cerebral artery: Secondary | ICD-10-CM | POA: Diagnosis not present

## 2017-11-15 LAB — BASIC METABOLIC PANEL
ANION GAP: 3 — AB (ref 5–15)
BUN: 12 mg/dL (ref 8–23)
CHLORIDE: 120 mmol/L — AB (ref 98–111)
CO2: 16 mmol/L — AB (ref 22–32)
Calcium: 8.7 mg/dL — ABNORMAL LOW (ref 8.9–10.3)
Creatinine, Ser: 1.09 mg/dL — ABNORMAL HIGH (ref 0.44–1.00)
GFR calc Af Amer: 58 mL/min — ABNORMAL LOW (ref >60)
GFR calc non Af Amer: 50 mL/min — ABNORMAL LOW (ref >60)
GLUCOSE: 96 mg/dL (ref 70–99)
POTASSIUM: 3.5 mmol/L (ref 3.5–5.1)
Sodium: 139 mmol/L (ref 135–145)

## 2017-11-15 LAB — MAGNESIUM: Magnesium: 2 mg/dL (ref 1.7–2.4)

## 2017-11-15 MED ORDER — ONDANSETRON HCL 4 MG PO TABS: 4.0000 mg | ORAL_TABLET | Freq: Four times a day (QID) | ORAL | Status: DC | PRN

## 2017-11-15 MED ORDER — BISACODYL 10 MG RE SUPP: 10.0000 mg | Freq: Every day | RECTAL | 0 refills | Status: DC | PRN

## 2017-11-15 MED ORDER — ALPRAZOLAM 0.5 MG PO TABS: 0.5000 mg | ORAL_TABLET | Freq: Every day | ORAL | 0 refills | Status: DC

## 2017-11-15 MED ORDER — POTASSIUM CHLORIDE CRYS ER 20 MEQ PO TBCR
40.0000 meq | EXTENDED_RELEASE_TABLET | Freq: Once | ORAL | Status: AC
  Administered 2017-11-15: 40 meq via ORAL
  Filled 2017-11-15: qty 2

## 2017-11-15 MED ORDER — POTASSIUM CHLORIDE CRYS ER 20 MEQ PO TBCR
40.0000 meq | EXTENDED_RELEASE_TABLET | Freq: Every day | ORAL | Status: DC
  Administered 2017-11-15 – 2017-11-29 (×15): 40 meq via ORAL
  Filled 2017-11-15 (×14): qty 2

## 2017-11-15 MED ORDER — APIXABAN 5 MG PO TABS
5.0000 mg | ORAL_TABLET | Freq: Two times a day (BID) | ORAL | Status: DC
  Administered 2017-11-15 – 2017-12-08 (×46): 5 mg via ORAL
  Filled 2017-11-15 (×30): qty 1
  Filled 2017-11-15: qty 2
  Filled 2017-11-15 (×15): qty 1

## 2017-11-15 MED ORDER — PANTOPRAZOLE SODIUM 40 MG PO TBEC
40.0000 mg | DELAYED_RELEASE_TABLET | Freq: Every day | ORAL | Status: DC
  Administered 2017-11-16 – 2017-12-08 (×23): 40 mg via ORAL
  Filled 2017-11-15 (×23): qty 1

## 2017-11-15 MED ORDER — ALPRAZOLAM 0.5 MG PO TABS
1.0000 mg | ORAL_TABLET | Freq: Every day | ORAL | Status: DC
  Administered 2017-11-15 – 2017-12-07 (×23): 1 mg via ORAL
  Filled 2017-11-15 (×23): qty 2

## 2017-11-15 MED ORDER — ONDANSETRON HCL 4 MG/2ML IJ SOLN
4.0000 mg | Freq: Four times a day (QID) | INTRAMUSCULAR | Status: DC | PRN
  Filled 2017-11-15: qty 2

## 2017-11-15 MED ORDER — ATORVASTATIN CALCIUM 80 MG PO TABS
80.0000 mg | ORAL_TABLET | Freq: Every day | ORAL | Status: DC
  Administered 2017-11-16 – 2017-12-08 (×23): 80 mg via ORAL
  Filled 2017-11-15 (×24): qty 1

## 2017-11-15 MED ORDER — POLYETHYLENE GLYCOL 3350 17 G PO PACK
17.0000 g | PACK | Freq: Two times a day (BID) | ORAL | Status: DC
  Administered 2017-11-15 – 2017-11-23 (×9): 17 g via ORAL
  Filled 2017-11-15 (×13): qty 1

## 2017-11-15 MED ORDER — LEVOTHYROXINE SODIUM 50 MCG PO TABS
50.0000 ug | ORAL_TABLET | Freq: Every day | ORAL | Status: DC
  Administered 2017-11-16 – 2017-12-08 (×23): 50 ug via ORAL
  Filled 2017-11-15 (×23): qty 1

## 2017-11-15 MED ORDER — ACETAMINOPHEN 325 MG PO TABS
650.0000 mg | ORAL_TABLET | Freq: Four times a day (QID) | ORAL | Status: DC | PRN
  Administered 2017-11-17 – 2017-12-07 (×18): 650 mg via ORAL
  Filled 2017-11-15 (×19): qty 2

## 2017-11-15 MED ORDER — BISACODYL 10 MG RE SUPP
10.0000 mg | Freq: Every day | RECTAL | Status: AC
  Administered 2017-11-16: 10 mg via RECTAL
  Filled 2017-11-15: qty 1

## 2017-11-15 MED ORDER — SORBITOL 70 % SOLN: 30.0000 mL | Freq: Every day | Status: DC | PRN

## 2017-11-15 NOTE — Discharge Summary (Signed)
Holly Flores ZOX:[id number] DOB: 1945-12-02 DOA: 11/09/2017  PCP: Octavio Graves, DO  Admit date: 11/09/2017  Discharge date: 11/15/2017  Admitted From: CIR   Disposition:  CIR/SNF   Recommendations for Outpatient Follow-up:   Follow up with PCP in 1-2 weeks  PCP Please obtain BMP/CBC, 2 view CXR in 1week,  (see Discharge instructions)   PCP Please follow up on the following pending results:    Home Health: None   Equipment/Devices: None  Consultations: GI Discharge Condition: Stable   CODE STATUS: Full   Diet Recommendation: Heart Healthy    CC - Abd pain   Brief history of present illness from the day of admission and additional interim summary    Holly Flores is a 72 year old female with PMH of paroxysmal atrial fibrillation on Eliquis, HTN, hypothyroidism, dysarthria, and 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                                                                 Hospital Course    Colonic pseudoobstruction, consistent with Ogilve's syndrome/ileus: GI on board Case discussed with gastroenterologist Dr. Donalda Ewings on 11/13/2017, patient has been placed on scheduled MiraLAX and Dulcolax suppositories with good effect, she has had 3 liquid bowel movements in the last 3 days including today, abdominal exam is much improved on a serial basis, she is now symptom-free will be discharged back to CIR/SNF.  Kindly monitor electrolytes closely, please make sure patient has 1 bowel movement every day use as needed suppository as ordered if needed.  Updated daughter bedside on 11/15/2017.  Paroxysmal atrial fibrillation: Rate controlled-continue Eliquis.  Recent CVA:  Continues to have a dense right-sided hemiparesis-continue Eliquis and Lipitor continue PT, stable now discharge back to CIR/SNF.  Leukocytosis: Resolving and suspected to have been reactive.  Afebrile and no signs of infection or toxicity.  Acute kidney injury: Likely hemodynamically mediated, resolved after IV fluids.   Hypokalemia: Aggressively replaced currently stable, continue to monitor potassium levels closely at SNF/CIR.  Hypothyroidism: Continue levothyroxine.    Discharge diagnosis     Principal Problem:   Ogilvie's syndrome Active Problems:   COPD (chronic obstructive pulmonary disease) (HCC)   Chronic diastolic CHF (congestive heart failure) (HCC)   Hypothyroidism   Essential hypertension   CKD (chronic kidney disease), stage II   PAF (paroxysmal atrial fibrillation) (HCC)   Right flaccid hemiplegia (HCC)   Hemiparesis affecting right side as late effect of stroke [facility])    Discharge instructions    Discharge Instructions    Diet - low sodium heart healthy   Complete by:  As directed    Discharge instructions   Complete by:  As directed    Follow  with Primary MD Octavio Graves, DO in 7 days   Get CBC, CMP, 2 view Chest X ray checked  by Primary MD or SNF MD in 5-7 days    Activity: As tolerated with Full fall precautions use walker/cane & assistance as needed  Disposition CIR/SNF  Diet: Heart Healthy with feeding assistance and aspiration precautions.  For Heart failure patients - Check your Weight same time everyday, if you gain over 2 pounds, or you develop in leg swelling, experience more shortness of breath or chest pain, call your Primary MD immediately. Follow Cardiac Low Salt Diet and 1.5 lit/day fluid restriction.  Special Instructions: If you have smoked or chewed Tobacco  in the last 2 yrs please stop smoking, stop any regular Alcohol  and or any Recreational drug use.  On your next visit with your primary care physician please Get  Medicines reviewed and adjusted.  Please request your Prim.MD to go over all Hospital Tests and Procedure/Radiological results at the follow up, please get all Hospital records sent to your Prim MD by signing hospital release before you go home.  If you experience worsening of your admission symptoms, develop shortness of breath, life threatening emergency, suicidal or homicidal thoughts you must seek medical attention immediately by calling 911 or calling your MD immediately  if symptoms less severe.  You Must read complete instructions/literature along with all the possible adverse reactions/side effects for all the Medicines you take and that have been prescribed to you. Take any new Medicines after you have completely understood and accpet all the possible adverse reactions/side effects.   Increase activity slowly   Complete by:  As directed       Discharge Medications   Allergies as of 11/15/2017      Reactions   Penicillins Hives, Itching, Rash   Has patient had a PCN reaction causing immediate rash, facial/tongue/throat swelling, SOB or lightheadedness with hypotension: Yes Has patient had a PCN reaction causing severe rash involving mucus membranes or skin necrosis: Yes Has patient had a PCN reaction that required hospitalization: No Has patient had a PCN reaction occurring within the last 10 years: No If all of the above answers are "NO", then may proceed with Cephalosporin use.   Sulfa Antibiotics Swelling   Tongue swelling      Medication List    STOP taking these medications   dextrose 5 % and 0.45 % NaCl with KCl 10 mEq/L 10-5-0.45 MEQ/L-%-%   Levofloxacin 250 MG/50ML Soln Commonly known as:  LEVAQUIN   metroNIDAZOLE 5-0.79 MG/ML-% IVPB Commonly known as:  FLAGYL   oxyCODONE 5 MG immediate release tablet Commonly known as:  Oxy IR/ROXICODONE     TAKE these medications   acetaminophen 650 MG suppository Commonly known as:  TYLENOL Place 1 suppository (650 mg  total) rectally every 6 (six) hours as needed for mild pain (or Fever >/= 101).   albuterol 108 (90 Base) MCG/ACT inhaler Commonly known as:  PROVENTIL HFA;VENTOLIN HFA Inhale 2 puffs into the lungs every 6 (six) hours as needed (for wheezing/shortness of breath).   ALPRAZolam 0.5 MG tablet Commonly known as:  XANAX Take 1 tablet (0.5 mg total) by mouth at bedtime. What changed:    medication strength  how much to take   apixaban 5 MG Tabs tablet Commonly known as:  ELIQUIS Take 1 tablet (5 mg total) by mouth 2 (two) times daily.   atorvastatin 80 MG tablet Commonly known as:  LIPITOR Take 1 tablet (80 mg total)  by mouth daily.   bisacodyl 10 MG suppository Commonly known as:  DULCOLAX Place 1 suppository (10 mg total) rectally daily as needed for moderate constipation.   LEVOTHROID 50 MCG tablet Generic drug:  levothyroxine Take 50 mcg by mouth daily before breakfast.   omega-3 acid ethyl esters 1 g capsule Commonly known as:  LOVAZA Take 2 capsules (2 g total) by mouth daily.   pantoprazole 40 MG tablet Commonly known as:  PROTONIX Take 1 tablet (40 mg total) by mouth daily.   polyethylene glycol packet Commonly known as:  MIRALAX / GLYCOLAX Take 17 g by mouth daily as needed for mild constipation.   potassium chloride SA 20 MEQ tablet Commonly known as:  K-DUR,KLOR-CON Take 20 mEq by mouth daily.       Follow-up Information    Octavio Graves, DO. Schedule an appointment as soon as possible for a visit in 1 week(s).   Contact information: [address] [address] [phone]        Arnoldo Lenis, MD .   Specialty:  Cardiology Contact information: [place] [address] [phone]        Garvin Fila, MD. Schedule an appointment as soon as possible for a visit in 2 week(s).   Specialties:  Neurology, Radiology Contact information: [address] [place]  [address] [phone]           Major procedures and Radiology Reports - PLEASE review detailed and final reports thoroughly  -         Ct Abdomen Pelvis Wo Contrast  Result Date: 11/09/2017 CLINICAL DATA:  72 year old female with left lower quadrant pain, nausea, vomiting and constipation. Negative C difficile. Hypokalemia. Subsequent encounter. EXAM: CT ABDOMEN AND PELVIS WITHOUT CONTRAST TECHNIQUE: Multidetector CT imaging of the abdomen and pelvis was performed following the standard protocol without IV contrast. COMPARISON:  11/08/2017 plain film exam.  03/02/2011 CT. FINDINGS: Lower chest: Basilar subsegmental atelectasis. Minimal pulmonary vascular congestion. Heart size top-normal. Right coronary artery calcification. Hepatobiliary: Taking into account limitation by non contrast imaging, no worrisome hepatic lesion. Post cholecystectomy. Pancreas: 1.1 cm fatty lesion pancreatic tail possibly small lipoma. No evidence of pancreatic inflammation. Spleen: Taking into account limitation by non contrast imaging, no splenic mass or enlargement. Adrenals/Urinary Tract: No adrenal mass. No obstructing stone or hydronephrosis. 1.3 cm right renal cyst. Under distended urinary bladder unremarkable. Stomach/Bowel: Gas and fluid-filled distended colon with the transverse colon measuring up to 13.3 cm. No distal obstructing lesion is noted. Fluid is seen throughout the distal sigmoid colon/rectum. Etiology of these findings indeterminate possibly related to ileus, metabolic abnormality (hypokalemia) or infection (although C difficile is negative). There is an area of questionable narrowing of the splenic flexure of the colon (series 6, image 61 series 7, image 95) and therefore mass not excluded although it is possible this represents peristalsis. No pneumatosis, free air or abnormal fluid collection. No extraluminal bowel inflammation. Appendix unremarkable. Stomach is incompletely distended although  otherwise negative. No primary small bowel abnormality. No bowel containing hernia detected. Vascular/Lymphatic: Atherosclerotic plaque aorta, aortic branch vessel and iliac arteries. No aortic aneurysm. Prominent bend of the lower thoracic aorta/upper abdominal aorta. Scattered normal size lymph nodes. Reproductive: Post hysterectomy.  No worrisome adnexal mass. Other: Injection sites anterior abdominal wall. Musculoskeletal: Prior fusion L4-5 and L5-S1. Degenerative changes L3-4 and mid to lower thoracic spine. IMPRESSION: 1. Gas and fluid-filled distended colon with the transverse colon measuring up  to 13.3 cm. No distal obstructing lesion is noted. Fluid is seen throughout the distal sigmoid colon/rectum. Etiology of these findings is indeterminate possibly related to ileus, metabolic abnormality (hypokalemia) or infection (although C difficile is negative). 2. There is an area of questionable narrowing of the splenic flexure of the colon (series 6, image 61 series 7, image 95) and therefore mass not excluded although it is possible this represents peristalsis. 3. No pneumatosis, free air or abnormal fluid collection. No extraluminal bowel inflammation. 4. 1.1 cm fatty lesion pancreas possibly a lipoma. 5. Prior fusion L4-5 and L5-S1. Degenerative changes mid to lower thoracic spine and L3-4 level. 6.  Aortic Atherosclerosis (ICD10-I70.0). Electronically Signed   By: Genia Del M.D.   On: 11/09/2017 10:20   Ct Angio Head W Or Wo Contrast  Result Date: 10/29/2017 CLINICAL DATA:  Stroke, wall of. Weakness. Acute infarct of the left external capsule EXAM: CT ANGIOGRAPHY HEAD AND NECK TECHNIQUE: Multidetector CT imaging of the head and neck was performed using the standard protocol during bolus administration of intravenous contrast. Multiplanar CT image reconstructions and MIPs were obtained to evaluate the vascular anatomy. Carotid stenosis measurements (when applicable) are obtained utilizing NASCET  criteria, using the distal internal carotid diameter as the denominator. CONTRAST:  34mL ISOVUE-370 IOPAMIDOL (ISOVUE-370) INJECTION 76% COMPARISON:  MRI brain 10/29/2017. CT head without contrast 10/29/2017. FINDINGS: CTA NECK FINDINGS Aortic arch: There is a common origin of the left common carotid artery and the innominate artery. No significant stenosis is present. Atherosclerotic changes are present. There is no aneurysm. Right carotid system: The right common carotid artery is within normal limits. Bifurcation is unremarkable. The cervical right ICA is tortuous without significant stenosis. Minimal atherosclerotic calcifications Left carotid system: The left common carotid artery is within normal limits. The bifurcation is within normal limits. There is mild tortuosity of the cervical left ICA without significant stenosis. Vertebral arteries: The left vertebral artery is dominant. Both vertebral arteries originate from the subclavian arteries without significant stenosis. There is no focal stenosis or injury to either vascular vertebral artery in the neck. Skeleton: Extensive anterior cervical spine fusion is present C3-6. Vertebral body heights are maintained. No focal lytic or blastic lesions are present. Other neck: A multinodular goiter extends into the upper mediastinum. There is no dominant lesion. Salivary glands are within normal limits. No significant adenopathy is present. Upper chest: The lung apices demonstrate mild ground-glass attenuation without focal airspace consolidation. No nodule or mass lesion is present. Review of the MIP images confirms the above findings CTA HEAD FINDINGS Anterior circulation: Minimal calcifications are present within the cavernous internal carotid arteries. There is no significant stenosis from the skull base through the ICA terminus bilaterally. The anterior communicating artery is patent. A1 and M1 segments are normal. The ACA and MCA branch vessels are within normal  limits. MCA bifurcations are unremarkable. Posterior circulation: The left vertebral artery is dominant vessel. PICA origin is visualized and normal. Vertebrobasilar junction is. Both posterior cerebral arteries originate from the basilar tip. 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 PCA branch vessels are within normal limits bilaterally. Venous sinuses: The dural sinuses are patent. Straight sinus and deep cerebral veins are intact. Cortical veins are within normal limits. Anatomic variants: None Delayed phase: The postcontrast images exaggerate subcortical white matter changes bilaterally. The left internal capsule infarct is still poorly seen. No pathologic enhancement is present. Review of the MIP images confirms the above findings IMPRESSION: 1. No  large vessel occlusion. 2. No significant proximal stenosis account for the patient's infarct. 3. 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 This can be followed with MRA or CTA on an annual basis. 4. Multinodular goiter. 5. Moderate diffuse white matter disease likely reflects the sequela of chronic microvascular ischemia. Electronically Signed   By: San Morelle M.D.   On: 10/29/2017 13:37   Dg Chest 2 View  Result Date: 11/02/2017 CLINICAL DATA:  Cough EXAM: CHEST - 2 VIEW COMPARISON:  Chest radiograph January 16, 2015 and chest CT May 20, 2017 FINDINGS: There is no edema or consolidation. Heart is mildly enlarged with pulmonary vascularity normal. No adenopathy. There is postoperative change in the lower cervical region. IMPRESSION: Mild cardiac enlargement.  No edema or consolidation. Electronically Signed   By: Lowella Grip III M.D.   On: 11/02/2017 09:52   Dg Abd 1 View  Result Date: 11/12/2017 CLINICAL DATA:  Ogilvie's syndrome [id number], continued abd pain (complaints of bilat. upper abd pain today) EXAM: ABDOMEN - 1 VIEW COMPARISON:  11/11/2017 FINDINGS: There is persistent significant dilatation of  the transverse colon. No small bowel dilatation or evidence for free intraperitoneal air. Surgical clips are present in the RIGHT UPPER QUADRANT the abdomen. Remote lumbar spine fusion. IMPRESSION: Persistent gaseous distention of the colon. Electronically Signed   By: Nolon Nations M.D.   On: 11/12/2017 10:28   Dg Abd 1 View  Result Date: 11/10/2017 CLINICAL DATA:  Recent stroke 2 weeks ago, persistent abdominal pain and distention, Ogilvie syndrome EXAM: ABDOMEN - 1 VIEW COMPARISON:  CT abdomen and pelvis 11/09/2017 FINDINGS: Persistent gaseous distention of colon, greatest at transverse colon which measures up to 14 cm diameter. Rectum decompressed. No bowel wall thickening or small bowel distention. Bones demineralized with prior lumbosacral fusion L4-S1. IMPRESSION: Persistent gaseous distention of colon especially transverse colon. Electronically Signed   By: Lavonia Dana M.D.   On: 11/10/2017 11:13   Dg Abd 1 View  Result Date: 11/08/2017 CLINICAL DATA:  Nausea, abdominal distention. EXAM: ABDOMEN - 1 VIEW COMPARISON:  None. FINDINGS: No small bowel dilatation is noted. Air-filled dilated colon is noted, with the largest diameter measured at 15.4 cm in transverse colon. Status post cholecystectomy. No abnormal calcifications are noted IMPRESSION: Air-filled dilated colon is noted measuring 15.4 cm in largest diameter. This is concerning for possible ileus. Electronically Signed   By: Marijo Conception, M.D.   On: 11/08/2017 13:22   Ct Angio Neck W Or Wo Contrast  Result Date: 10/29/2017 CLINICAL DATA:  Stroke, wall of. Weakness. Acute infarct of the left external capsule EXAM: CT ANGIOGRAPHY HEAD AND NECK TECHNIQUE: Multidetector CT imaging of the head and neck was performed using the standard protocol during bolus administration of intravenous contrast. Multiplanar CT image reconstructions and MIPs were obtained to evaluate the vascular anatomy. Carotid stenosis measurements (when applicable) are  obtained utilizing NASCET criteria, using the distal internal carotid diameter as the denominator. CONTRAST:  51mL ISOVUE-370 IOPAMIDOL (ISOVUE-370) INJECTION 76% COMPARISON:  MRI brain 10/29/2017. CT head without contrast 10/29/2017. FINDINGS: CTA NECK FINDINGS Aortic arch: There is a common origin of the left common carotid artery and the innominate artery. No significant stenosis is present. Atherosclerotic changes are present. There is no aneurysm. Right carotid system: The right common carotid artery is within normal limits. Bifurcation is unremarkable. The cervical right ICA is tortuous without significant stenosis. Minimal atherosclerotic calcifications Left carotid system: The left common carotid artery is within normal limits. The  bifurcation is within normal limits. There is mild tortuosity of the cervical left ICA without significant stenosis. Vertebral arteries: The left vertebral artery is dominant. Both vertebral arteries originate from the subclavian arteries without significant stenosis. There is no focal stenosis or injury to either vascular vertebral artery in the neck. Skeleton: Extensive anterior cervical spine fusion is present C3-6. Vertebral body heights are maintained. No focal lytic or blastic lesions are present. Other neck: A multinodular goiter extends into the upper mediastinum. There is no dominant lesion. Salivary glands are within normal limits. No significant adenopathy is present. Upper chest: The lung apices demonstrate mild ground-glass attenuation without focal airspace consolidation. No nodule or mass lesion is present. Review of the MIP images confirms the above findings CTA HEAD FINDINGS Anterior circulation: Minimal calcifications are present within the cavernous internal carotid arteries. There is no significant stenosis from the skull base through the ICA terminus bilaterally. The anterior communicating artery is patent. A1 and M1 segments are normal. The ACA and MCA branch  vessels are within normal limits. MCA bifurcations are unremarkable. Posterior circulation: The left vertebral artery is dominant vessel. PICA origin is visualized and normal. Vertebrobasilar junction is. Both posterior cerebral arteries originate from the basilar tip. 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 PCA branch vessels are within normal limits bilaterally. Venous sinuses: The dural sinuses are patent. Straight sinus and deep cerebral veins are intact. Cortical veins are within normal limits. Anatomic variants: None Delayed phase: The postcontrast images exaggerate subcortical white matter changes bilaterally. The left internal capsule infarct is still poorly seen. No pathologic enhancement is present. Review of the MIP images confirms the above findings IMPRESSION: 1. No large vessel occlusion. 2. No significant proximal stenosis account for the patient's infarct. 3. 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 This can be followed with MRA or CTA on an annual basis. 4. Multinodular goiter. 5. Moderate diffuse white matter disease likely reflects the sequela of chronic microvascular ischemia. Electronically Signed   By: San Morelle M.D.   On: 10/29/2017 13:37   Mr Brain Wo Contrast  Result Date: 10/29/2017 CLINICAL DATA:  Hemiplegia EXAM: MRI HEAD WITHOUT CONTRAST TECHNIQUE: Multiplanar, multiecho pulse sequences of the brain and surrounding structures were obtained without intravenous contrast. COMPARISON:  CT head 10/29/2017 FINDINGS: Brain: Acute infarct left posterior lenticulostriate. This involves the posterior external capsule fibers. No other acute infarct. Ventricle size normal. Moderate chronic microvascular ischemic change in the white matter. Chronic microhemorrhage in the left frontal periventricular white matter. Negative for mass or edema. Vascular: Normal arterial flow voids Skull and upper cervical spine: Negative Sinuses/Orbits: Paranasal  sinuses clear. Bilateral cataract surgery. Other: None IMPRESSION: Acute infarct left external capsule posteriorly. Moderate chronic microvascular ischemic change. Chronic microhemorrhage left frontal white matter adjacent to the head of caudate. Electronically Signed   By: Franchot Gallo M.D.   On: 10/29/2017 08:53   Dg Abd 2 Views  Result Date: 11/13/2017 CLINICAL DATA:  Abdominal pain and distension. EXAM: ABDOMEN - 2 VIEW COMPARISON:  11/12/2017, 11/10/2017 FINDINGS: There is persistent severe distension of the colon most severe in the transverse colon measuring 13.5 cm in diameter. There is no evidence of pneumoperitoneum, portal venous gas or pneumatosis. There are no pathologic calcifications along the expected course of the ureters. Posterior lumbar interbody fusion at L5-S1. IMPRESSION: 1. Persistent severe distension of the colon most severe in the transverse colon measuring 13.5 cm in diameter. Electronically Signed   By:  Kathreen Devoid   On: 11/13/2017 10:59   Dg Abd Portable 1v  Result Date: 11/14/2017 CLINICAL DATA:  Ileus.  LEFT lower quadrant pain. EXAM: PORTABLE ABDOMEN - 1 VIEW COMPARISON:  Radiograph 11/13/2017, CT 11/09/2017 FINDINGS: Persistent distension of the transverse colon to 12 cm slightly reduced from 14 cm on prior. No evidence small bowel obstruction. There is gas and stool in the rectum. Posterior lumbar fusion and cholecystectomy clips noted IMPRESSION: Persistent distention of the transverse colon without obstruction. Mild improvement. Electronically Signed   By: Suzy Bouchard M.D.   On: 11/14/2017 10:32   Dg Abd Portable 1v  Result Date: 11/11/2017 CLINICAL DATA:  Follow-up abdominal distension EXAM: PORTABLE ABDOMEN - 1 VIEW COMPARISON:  11/11/2017 FINDINGS: Scattered large and small bowel gas is noted. Distension of the transverse colon is again seen with slightly less than that noted on the prior exam. No free air is seen. Postsurgical changes in the lower lumbar  spine are noted. IMPRESSION: Persistent but slightly improved dilatation of the transverse colon. Electronically Signed   By: Inez Catalina M.D.   On: 11/11/2017 13:54   Dg Abd Portable 1v  Result Date: 11/11/2017 CLINICAL DATA:  Abdominal distension. EXAM: PORTABLE ABDOMEN - 1 VIEW COMPARISON:  Radiograph of November 10, 2017. FINDINGS: Stable dilated air-filled colon, particularly transverse colon, which measures 13.6 cm. No significant small bowel dilatation is noted. Postsurgical changes are seen in the lumbar spine. Status post cholecystectomy. IMPRESSION: Stable dilated air-filled colon is noted, particularly transverse colon. Electronically Signed   By: Marijo Conception, M.D.   On: 11/11/2017 09:38   Ct Head Code Stroke Wo Contrast`  Result Date: 10/29/2017 CLINICAL DATA:  Code stroke. Sudden onset difficulty talking and weakness. EXAM: CT HEAD WITHOUT CONTRAST TECHNIQUE: Contiguous axial images were obtained from the base of the skull through the vertex without intravenous contrast. COMPARISON:  05/20/2017 CT head. FINDINGS: Brain: No evidence of acute infarction, hemorrhage, hydrocephalus, extra-axial collection or mass lesion/mass effect. Stable chronic microvascular ischemic changes and volume loss of the brain. Vascular: Calcific atherosclerosis of carotid siphons. No hyperdense vessel identified. Skull: Normal. Negative for fracture or focal lesion. Sinuses/Orbits: No acute finding. Bilateral intra-ocular lens replacement. Other: None. ASPECTS [facility] Stroke Program Early CT Score) - Ganglionic level infarction (caudate, lentiform nuclei, internal capsule, insula, M1-M3 cortex): 7 - Supraganglionic infarction (M4-M6 cortex): 3 Total score (0-10 with 10 being normal): 10 IMPRESSION: 1. No acute intracranial abnormality identified. 2. Stable chronic microvascular ischemic changes and volume loss of the brain. 3. ASPECTS is 10 These results were called by telephone at the time of interpretation on  10/29/2017 at 4:35 am to Dr. Veryl Speak , who verbally acknowledged these results. Electronically Signed   By: Kristine Garbe M.D.   On: 10/29/2017 04:39    Micro Results     Recent Results (from the past 240 hour(s))  C difficile quick scan w PCR reflex     Status: None   Collection Time: 11/08/17  5:33 PM  Result Value Ref Range Status   C Diff antigen NEGATIVE NEGATIVE Final   C Diff toxin NEGATIVE NEGATIVE Final   C Diff interpretation No C. difficile detected.  Final    Comment: Performed at [facility], [place] [address] [place], [place] [address]    Today   Subjective    Holly Flores today has no headache,no chest abdominal pain,no new weakness tingling or numbness, feels much better.   Objective   Blood pressure  118/68, pulse 63, temperature 97.9 F (36.6 C), temperature source Oral, resp. rate 18, height 5\' 1"  (1.549 m), weight 95.9 kg, SpO2 100 %.   Intake/Output Summary (Last 24 hours) at 11/15/2017 1042 Last data filed at 11/15/2017 1004 Gross per 24 hour  Intake 200 ml  Output 1900 ml  Net -1700 ml    Exam Awake Alert, Oriented x 3, No new F.N deficits, continues to have R. Sided weakness, Normal affect [place].AT,PERRAL Supple Neck,No JVD, No cervical lymphadenopathy appriciated.  Symmetrical Chest wall movement, Good air movement bilaterally, CTAB RRR,No Gallops,Rubs or new Murmurs, No Parasternal Heave +ve B.Sounds, Abd Soft, Non tender, No organomegaly appriciated, No rebound -guarding or rigidity. No Cyanosis, Clubbing or edema, No new Rash or bruise   Data Review   CBC w Diff:  Lab Results  Component Value Date   WBC 10.8 (H) 11/14/2017   HGB 11.9 (L) 11/14/2017   HCT 37.7 11/14/2017   PLT 248 11/14/2017   LYMPHOPCT 23 11/02/2017   MONOPCT 9 11/02/2017   EOSPCT 1 11/02/2017   BASOPCT 1 11/02/2017    CMP:  Lab Results  Component Value Date   NA 139 11/15/2017   K 3.5 11/15/2017   CL 120 (H) 11/15/2017   CO2 16 (L)  11/15/2017   BUN 12 11/15/2017   CREATININE 1.09 (H) 11/15/2017   PROT 7.5 11/02/2017   ALBUMIN 3.5 11/02/2017   BILITOT 0.6 11/02/2017   ALKPHOS 86 11/02/2017   AST 19 11/02/2017   ALT 16 11/02/2017  .   Total Time in preparing paper work, data evaluation and todays exam - 59 minutes  Lala Lund M.D on 11/15/2017 at 10:42 AM  [facility]   Office  [phone]

## 2017-11-15 NOTE — Progress Notes (Signed)
Pt refuse cpap QHS stating she does not wear at home, she wears O2 instead

## 2017-11-15 NOTE — Discharge Instructions (Signed)
Follow with Primary MD Octavio Graves, DO in 7 days   Get CBC, CMP, 2 view Chest X ray checked  by Primary MD or SNF MD in 5-7 days    Activity: As tolerated with Full fall precautions use walker/cane & assistance as needed  Disposition CIR/SNF  Diet: Heart Healthy with feeding assistance and aspiration precautions.  For Heart failure patients - Check your Weight same time everyday, if you gain over 2 pounds, or you develop in leg swelling, experience more shortness of breath or chest pain, call your Primary MD immediately. Follow Cardiac Low Salt Diet and 1.5 lit/day fluid restriction.  Special Instructions: If you have smoked or chewed Tobacco  in the last 2 yrs please stop smoking, stop any regular Alcohol  and or any Recreational drug use.  On your next visit with your primary care physician please Get Medicines reviewed and adjusted.  Please request your Prim.MD to go over all Hospital Tests and Procedure/Radiological results at the follow up, please get all Hospital records sent to your Prim MD by signing hospital release before you go home.  If you experience worsening of your admission symptoms, develop shortness of breath, life threatening emergency, suicidal or homicidal thoughts you must seek medical attention immediately by calling 911 or calling your MD immediately  if symptoms less severe.  You Must read complete instructions/literature along with all the possible adverse reactions/side effects for all the Medicines you take and that have been prescribed to you. Take any new Medicines after you have completely understood and accpet all the possible adverse reactions/side effects.     Information on my medicine - ELIQUIS (apixaban)  This medication education was reviewed with me or my healthcare representative as part of my discharge preparation.  The pharmacist that spoke with me during my hospital stay was:  Onnie Boer, RPH-CPP  Why was Eliquis prescribed for  you? Eliquis was prescribed for you to reduce the risk of a blood clot forming that can cause a stroke if you have a medical condition called atrial fibrillation (a type of irregular heartbeat).  What do You need to know about Eliquis ? Take your Eliquis TWICE DAILY - one tablet in the morning and one tablet in the evening with or without food. If you have difficulty swallowing the tablet whole please discuss with your pharmacist how to take the medication safely.  Take Eliquis exactly as prescribed by your doctor and DO NOT stop taking Eliquis without talking to the doctor who prescribed the medication.  Stopping may increase your risk of developing a stroke.  Refill your prescription before you run out.  After discharge, you should have regular check-up appointments with your healthcare provider that is prescribing your Eliquis.  In the future your dose may need to be changed if your kidney function or weight changes by a significant amount or as you get older.  What do you do if you miss a dose? If you miss a dose, take it as soon as you remember on the same day and resume taking twice daily.  Do not take more than one dose of ELIQUIS at the same time to make up a missed dose.  Important Safety Information A possible side effect of Eliquis is bleeding. You should call your healthcare provider right away if you experience any of the following: ? Bleeding from an injury or your nose that does not stop. ? Unusual colored urine (red or dark brown) or unusual colored stools (red or black). ?  Unusual bruising for unknown reasons. ? A serious fall or if you hit your head (even if there is no bleeding).  Some medicines may interact with Eliquis and might increase your risk of bleeding or clotting while on Eliquis. To help avoid this, consult your healthcare provider or pharmacist prior to using any new prescription or non-prescription medications, including herbals, vitamins, non-steroidal  anti-inflammatory drugs (NSAIDs) and supplements.  This website has more information on Eliquis (apixaban): http://www.eliquis.com/eliquis/home

## 2017-11-15 NOTE — H&P (Signed)
Physical Medicine and Rehabilitation Admission H&P     HPI: Holly Flores is a 72 year old right-handed African-American female with history of diastolic congestive heart failure, PAF maintained on Eliquis since July 2019 followed by Dr. Carlyle Dolly, CKD stage II, COPD, hypertension hyperlipidemia.  Per chart review and patient, patient lives with spouse.  Independent prior to admission and driving.  One level home with 3 steps to entry.  Good support of extended family.  Husband is receiving treatment for leukemia at [facility] hospital but can assist.  Presented 10/29/2017 to [facility] with right sided weakness, diplopia and difficulty speaking.  Troponin negative.  MRI reviewed, showing left brain infarction.  Per CT and MRI report, acute infarction left external capsule posteriorly.  Chronic microhemorrhage left frontal white matter adjacent to the head of the caudate.  CT angiogram of head and neck with no large vessel occlusion.  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  Echocardiogram with ejection fraction of 65% no wall motion abnormalities without thrombus.  Patient did not receive TPA.  Follow-up neurology services.  Patient's chronic Eliquis initially held to avoid hemorrhagic transformation initially placed on aspirin and Eliquis later resumed 11/01/2017 and aspirin at that time discontinued.  She was on mechanical soft diet.  Patient was admitted to inpatient rehab services 11/01/2017.  Patient progressing slowly with therapies.  Developed  diffuse abdominal discomfort worse on left lower quadrant 11/08/2017.  KUB completed concerning for possible ileus.  Her diet was changed to full liquids.  Noted significant hypokalemia 2.7 received intravenous supplement.  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  C. difficile specimen negative.  CT of the abdomen 11/09/2017 showed  fluid-filled distended colon with transverse colon measuring 13 cm.  No distal obstructing lesion noted.  There was an area of questionable narrowing of the splenic flexure of the colon.  Patient was discharged to acute care services for further evaluation 11/09/2017.  Gastroenterology services consulted and advised conservative care fleets enema and twice a day MiraLAX was ordered.  Narrowing of splenic flexure most likely peristalsis and monitored.  Hypokalemia improving 3.5 11/15/2017.  Patient remains on Eliquis for history of CVA/ PAF.  Latest abdominal film shows mild improvement.  Patient with no further bouts of nausea or vomiting.  Diet has been advanced to mechanical soft which was her diet prior to ileus.  Therapy evaluations completed with recommendations to resume inpatient rehab services.  Patient was admitted for a comprehensive rehab program.  Review of Systems  Constitutional: Negative for chills and fever.  HENT: Negative for hearing loss.   Eyes: Negative for blurred vision and double vision.  Respiratory: Positive for shortness of breath.   Cardiovascular: Positive for leg swelling. Negative for chest pain and palpitations.  Gastrointestinal: Positive for abdominal pain, nausea and vomiting.       GERD  Genitourinary: Negative for dysuria, flank pain and hematuria.  Musculoskeletal: Positive for myalgias.  Skin: Negative for rash.  Neurological: Positive for speech change, focal weakness and weakness.  Psychiatric/Behavioral: Positive for depression.       Anxiety  All other systems reviewed and are negative.  Past Medical History:  Diagnosis Date  . Anxiety   . Arthritis   . CHF (congestive heart failure) ([place])   . COPD (chronic obstructive pulmonary disease) ([place])   . Depression   . GERD (gastroesophageal reflux disease)   . Goiter   .  Hyperlipidemia   . Hypertension   . Hypothyroidism   . Kidney cysts    left side  . Myocardial infarction ([place])    8 yrs ago.   .  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 Shortness of breath   . Sleep apnea    uses CPAP, 3  . Stroke ([place])   . 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   Past Surgical History:  Procedure Laterality Date  . ABDOMINAL HYSTERECTOMY    . BACK SURGERY     x3,cervical and lunmbar, disc.  Marland Kitchen BREAST BIOPSY     left-non cancerous  . CARDIAC CATHETERIZATION    . CARPAL TUNNEL RELEASE Left   . CATARACT EXTRACTION W/PHACO  09/21/2011   Procedure: CATARACT EXTRACTION PHACO AND INTRAOCULAR LENS PLACEMENT (IOC);  Surgeon: Williams Che, MD;  Location: [facility];  Service: Ophthalmology;  Laterality: Left;  CDE:9.78  . CATARACT EXTRACTION W/PHACO Right 10/15/2014   Procedure: CATARACT EXTRACTION PHACO AND INTRAOCULAR LENS PLACEMENT ([place]);  Surgeon: Williams Che, MD;  Location: [facility];  Service: Ophthalmology;  Laterality: Right;  CDE:4.19  . CERVICAL SPINE SURGERY    . CESAREAN SECTION     x2  . CHOLECYSTECTOMY    . COLONOSCOPY  2006   RMR: 1. Internal hemorroids, otherwise normal rectum. 2. Pedunculated polyp at 35 cm. reomved with snare. The remainder of teh colonic mucosa appeared normal.   . COLONOSCOPY  2011   Dr. Gala Romney: multiple ascending colon polyps and rectal polyp, adenomatous  . COLONOSCOPY N/A 05/31/2014   Procedure: COLONOSCOPY;  Surgeon: Daneil Dolin, MD;  Location: [facility];  Service: Endoscopy;  Laterality: N/A;  130  . COLONOSCOPY WITH PROPOFOL N/A 06/24/2017   Procedure: COLONOSCOPY WITH PROPOFOL;  Surgeon: Daneil Dolin, MD;  Location[facility];  Service: Endoscopy;  Laterality: N/A;  2:15pm  . ESOPHAGOGASTRODUODENOSCOPY  2006   Dr. Gala Romney: Subtle Schatzkis ring, otherwise normal upper GI tract, aside from a small pyloric channell erosion, status post dilation as described above.   Marland Kitchen FLEXIBLE SIGMOIDOSCOPY N/A 11/11/2017   Procedure: FLEXIBLE SIGMOIDOSCOPY;  Surgeon: Otis Brace, MD;  Location: [place];  Service: Gastroenterology;  Laterality: N/A;  . KNEE  SURGERY Bilateral   . POLYPECTOMY  06/24/2017   Procedure: POLYPECTOMY;  Surgeon: Daneil Dolin, MD;  [facility];  Service: Endoscopy;;  ascending  . TONSILLECTOMY    . TOTAL KNEE ARTHROPLASTY Right 05/24/2013   DR MURPHY  . TOTAL KNEE ARTHROPLASTY Right 05/24/2013   Procedure: TOTAL KNEE ARTHROPLASTY;  Surgeon: [name], MD;  Location: [place];  Service: Orthopedics;  Laterality: Right;   Family History  Problem Relation Age of Onset  . Cancer Other   . Diabetes Other   . Heart disease Other   . Hypertension Other   . Asthma Other   . Alcoholism Other   . Thyroid disease Other   . Rheumatologic disease Other   . Stroke Other   . Hypercholesterolemia Other   . Colon cancer Neg Hx    Social History:  reports that she quit smoking about 30 years ago. Her smoking use included cigarettes. She has a 5.00 pack-year smoking history. She has never used smokeless tobacco. She reports that she drank alcohol. She reports that she does not use drugs. Allergies:  Allergies  Allergen Reactions  . Penicillins Hives, Itching and Rash    Has patient had a PCN reaction causing  immediate rash, facial/tongue/throat swelling, SOB or lightheadedness with hypotension: Yes Has patient had a PCN reaction causing severe rash involving mucus membranes or skin necrosis: Yes Has patient had a PCN reaction that required hospitalization: No Has patient had a PCN reaction occurring within the last 10 years: No If all of the above answers are "NO", then may proceed with Cephalosporin use.   . Sulfa Antibiotics Swelling    Tongue swelling   Medications Prior to Admission  Medication Sig Dispense Refill  . acetaminophen (TYLENOL) 650 MG suppository Place 1 suppository (650 mg total) rectally every 6 (six) hours as needed for mild pain (or Fever >/= 101). 12 suppository 0  . albuterol (PROVENTIL HFA;VENTOLIN HFA) 108 (90 Base) MCG/ACT inhaler Inhale 2 puffs into the lungs every 6 (six) hours as  needed (for wheezing/shortness of breath).     . ALPRAZolam (XANAX) 1 MG tablet Take 1 mg by mouth at bedtime.    Marland Kitchen apixaban (ELIQUIS) 5 MG TABS tablet Take 1 tablet (5 mg total) by mouth 2 (two) times daily. 60 tablet 3  . atorvastatin (LIPITOR) 80 MG tablet Take 1 tablet (80 mg total) by mouth daily.    . KCl in Dextrose-NaCl (DEXTROSE 5 % AND 0.45 % NACL WITH KCL 10 MEQ/L) 10-5-0.45 MEQ/L-%-% Inject 75 mLs into the vein continuous.    . Levofloxacin (LEVAQUIN) 250 MG/50ML SOLN Inject 50 mLs (250 mg total) into the vein daily. 1000 mL   . levothyroxine (LEVOTHROID) 50 MCG tablet Take 50 mcg by mouth daily before breakfast.    . metroNIDAZOLE (FLAGYL) 5-0.79 MG/ML-% IVPB Inject 100 mLs (500 mg total) into the vein every 8 (eight) hours. 100 mL   . omega-3 acid ethyl esters (LOVAZA) 1 g capsule Take 2 capsules (2 g total) by mouth daily.    Marland Kitchen oxyCODONE (OXY IR/ROXICODONE) 5 MG immediate release tablet Take 1 tablet (5 mg total) by mouth every 4 (four) hours as needed for moderate pain or severe pain. 30 tablet 0  . pantoprazole (PROTONIX) 40 MG tablet Take 1 tablet (40 mg total) by mouth daily.    . polyethylene glycol (MIRALAX / GLYCOLAX) packet Take 17 g by mouth daily as needed for mild constipation. 14 each 0  . potassium chloride SA (K-DUR,KLOR-CON) 20 MEQ tablet Take 20 mEq by mouth daily.      Drug Regimen Review Drug regimen was reviewed and remains appropriate with no significant issues identified  Home: Home Living Family/patient expects to be discharged to:: Inpatient rehab Living Arrangements: Spouse/significant other Available Help at Discharge: Family Type of Home: House Home Access: Stairs to enter [employer] of Steps: 2-3 steps to enter house with rails, 2 steps down to den but pt reports she does not have to go down there, pt states husband can install L rail at home entry Entrance Stairs-Rails: None Home Layout: One level Bathroom Shower/Tub: Clinical cytogeneticist: Standard Bathroom Accessibility: Yes Home Equipment: Cane - single point, Environmental consultant - 2 wheels, Shower seat, Bedside commode, Wheelchair - manual Additional Comments: home environment gleaned from chart review - hopeful return to CIR at discharge   Functional History: Prior Function Level of Independence: Independent, Independent with assistive device(s) Gait / Transfers Assistance Needed: at CIR: required use of Stedy and w/c for transfers and mobility Comments: Previous CIR stay required the use of a Stedy   Functional Status:  Mobility: Bed Mobility Overal bed mobility: Needs Assistance Bed Mobility: Supine to Sit Rolling: Mod assist(R <> L)  Supine to sit: Mod assist, HOB elevated General bed mobility comments: assist to bring R LE to EOB and to elevate trunk into sitting; cues for sequencing  Transfers Overall transfer level: Needs assistance Equipment used: 2 person hand held assist Transfer via Lift Equipment: Stedy Transfers: Sit to/from [employer] to Stand: Min assist, Mod assist, +2 safety/equipment General transfer comment: pt stood X2 with Stedy and X2 without; min A to power up and to gain balance with use of Stedy standing frame and mod A +2 for stand without AD with R knee blocked and R UE supported Ambulation/Gait General Gait Details: deferred for patient therapist safety    ADL:    Cognition: Cognition Overall Cognitive Status: Within Functional Limits for tasks assessed Orientation Level: Oriented X4 Cognition Arousal/Alertness: Awake/alert Behavior During Therapy: WFL for tasks assessed/performed Overall Cognitive Status: Within Functional Limits for tasks assessed  Physical Exam: Blood pressure 106/75, pulse 65, temperature 98.3 F (36.8 C), temperature source Oral, resp. rate 19, height '5\' 1"'  (1.549 m), weight 95.9 kg, SpO2 100 %. Physical Exam  Vitals reviewed. Constitutional: She is oriented to person, place, and time. She  appears well-developed.  Obese  HENT:  Head: Normocephalic and atraumatic.  Eyes: EOM are normal. Right eye exhibits no discharge. Left eye exhibits no discharge.  Neck: Normal range of motion. Neck supple. No thyromegaly present.  Cardiovascular: Normal rate and regular rhythm.  Respiratory: Effort normal and breath sounds normal. No respiratory distress.  GI:  Distended  Slowed bowel sounds.   Mild tenderness to left lower quadrant  Musculoskeletal:  LE edema  Neurological: She is alert and oriented to person, place, and time.  Speech is dysarthric but intelligible.   Follows full commands. Motor:  RUE/RLE: 0/5 proximal to distal. No increase in tone LUE/LLE: 4/5 proximal to distal  Skin: Skin is warm and dry.  Psychiatric: Her affect is blunt. Her speech is slurred. She is slowed.    Results for orders placed or performed during the hospital encounter of 11/09/17 (from the past 48 hour(s))  CBC     Status: Abnormal   Collection Time: 11/14/17  4:15 AM  Result Value Ref Range   WBC 10.8 (H) 4.0 - 10.5 K/uL   RBC 3.97 3.87 - 5.11 MIL/uL   Hemoglobin 11.9 (L) 12.0 - 15.0 g/dL   HCT 37.7 36.0 - 46.0 %   MCV 95.0 78.0 - 100.0 fL   MCH 30.0 26.0 - 34.0 pg   MCHC 31.6 30.0 - 36.0 g/dL   RDW 14.2 11.5 - 15.5 %   Platelets 248 150 - 400 K/uL    Comment: Performed at [facility] [address] [place], [place] [address]  Basic metabolic panel     Status: Abnormal   Collection Time: 11/14/17  4:15 AM  Result Value Ref Range   Sodium 139 135 - 145 mmol/L   Potassium 3.4 (L) 3.5 - 5.1 mmol/L   Chloride 119 (H) 98 - 111 mmol/L   CO2 16 (L) 22 - 32 mmol/L   Glucose, Bld 92 70 - 99 mg/dL   BUN 14 8 - 23 mg/dL   Creatinine, Ser 1.20 (H) 0.44 - 1.00 mg/dL   Calcium 8.6 (L) 8.9 - 10.3 mg/dL   GFR calc non Af Amer 44 (L) >60 mL/min   GFR calc Af Amer 51 (L) >60 mL/min    Comment: (NOTE) The eGFR has been calculated using the CKD EPI equation. This calculation has not been  validated in all clinical situations. eGFR's persistently <60 mL/min signify possible Chronic Kidney Disease.    Anion gap 4 (L) 5 - 15    Comment: Performed at [facility] [address] [place], [place] [address]  Magnesium     Status: None   Collection Time: 11/14/17  4:15 AM  Result Value Ref Range   Magnesium 2.1 1.7 - 2.4 mg/dL    Comment: Performed at [facility] [address] [place], [place] [address]  Basic metabolic panel     Status: Abnormal   Collection Time: 11/15/17  4:52 AM  Result Value Ref Range   Sodium 139 135 - 145 mmol/L   Potassium 3.5 3.5 - 5.1 mmol/L   Chloride 120 (H) 98 - 111 mmol/L   CO2 16 (L) 22 - 32 mmol/L   Glucose, Bld 96 70 - 99 mg/dL   BUN 12 8 - 23 mg/dL   Creatinine, Ser 1.09 (H) 0.44 - 1.00 mg/dL   Calcium 8.7 (L) 8.9 - 10.3 mg/dL   GFR calc non Af Amer 50 (L) >60 mL/min   GFR calc Af Amer 58 (L) >60 mL/min    Comment: (NOTE) The eGFR has been calculated using the CKD EPI equation. This calculation has not been validated in all clinical situations. eGFR's persistently <60 mL/min signify possible Chronic Kidney Disease.    Anion gap 3 (L) 5 - 15    Comment: Performed at [facility], [place] [address] [place], [place] [address]  Magnesium     Status: None   Collection Time: 11/15/17  4:52 AM  Result Value Ref Range   Magnesium 2.0 1.7 - 2.4 mg/dL    Comment: Performed at [facility] [address] [place], [place] [address]   Dg Abd 2 Views  Result Date: 11/13/2017 CLINICAL DATA:  Abdominal pain and distension. EXAM: ABDOMEN - 2 VIEW COMPARISON:  11/12/2017, 11/10/2017 FINDINGS: There is persistent severe distension of the colon most severe in the transverse colon measuring 13.5 cm in diameter. There is no evidence of pneumoperitoneum, portal venous gas or pneumatosis. There are no pathologic calcifications along the expected course of the ureters. Posterior lumbar interbody fusion at L5-S1. IMPRESSION: 1.  Persistent severe distension of the colon most severe in the transverse colon measuring 13.5 cm in diameter. Electronically Signed   By: Kathreen Devoid   On: 11/13/2017 10:59   Dg Abd Portable 1v  Result Date: 11/14/2017 CLINICAL DATA:  Ileus.  LEFT lower quadrant pain. EXAM: PORTABLE ABDOMEN - 1 VIEW COMPARISON:  Radiograph 11/13/2017, CT 11/09/2017 FINDINGS: Persistent distension of the transverse colon to 12 cm slightly reduced from 14 cm on prior. No evidence small bowel obstruction. There is gas and stool in the rectum. Posterior lumbar fusion and cholecystectomy clips noted IMPRESSION: Persistent distention of the transverse colon without obstruction. Mild improvement. Electronically Signed   By: Suzy Bouchard M.D.   On: 11/14/2017 10:32       Medical Problem List and Plan: 1.  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 2.  DVT Prophylaxis/Anticoagulation: Chronic Eliquis has been resumed. 3. Pain Management: Tylenol as needed 4. Mood: Xanax 1 mg nightly 5. Neuropsych: This patient is capable of making decisions on her own behalf. 6. Skin/Wound Care:  Routine skin checks 7. Fluids/Electrolytes/Nutrition: Routine in and outs with follow-up chemistries 8.  Ogilvie syndrome.  Follow-up per gastroenterology services.  Advance diet as tolerated 9.  Hypokalemia.  Follow-up chemistries 10.  Ogilvie syndrome.  Diet advanced to mechanical soft. 11.  Diastolic congestive heart failure.  Demadex 20 mg held due to hypokalemia.  Monitor for any signs of fluid overload 12.  Hypertension.  Monitor with increased mobility 13.  PAF.  Continue Eliquis.  Cardiac rate controlled 14.  Hypothyroidism.  Continue Synthroid 15.  CKD stage II.  Follow-up chemistries 16.  Hyperlipidemia.   Lipitor  Post Admission Physician Evaluation: 1. Preadmission assessment reviewed and changes made below. 2. Functional deficits secondary  to acute infarction left external capsule. 3. Patient is admitted to receive collaborative, interdisciplinary care between the physiatrist, rehab nursing staff, and therapy team. 4. Patient's level of medical complexity and substantial therapy needs in context of that medical necessity cannot be provided at a lesser intensity of care such as a SNF. 5. Patient has experienced substantial functional loss from his/her baseline which was documented above under the "Functional History" and "Functional Status" headings.  Judging by the patient's diagnosis, physical exam, and functional history, the patient has potential for functional progress which will result in measurable gains while on inpatient rehab.  These gains will be of substantial and practical use upon discharge  in facilitating mobility and self-care at the household level. 6. Physiatrist will provide 24 hour management of medical needs as well as oversight of the therapy plan/treatment and provide guidance as appropriate regarding the interaction of the two. 7. 24 hour rehab nursing will assist with bladder management, bowel management, safety, skin/wound care, disease management, medication administration, pain management and patient education  and help integrate therapy concepts, techniques,education, etc. 8. PT will assess and treat for/with: Lower extremity strength, range of motion, stamina, balance, functional mobility, safety, adaptive techniques and equipment, coping skills, pain control, education. Goals are: Min A. 9. OT will assess and treat for/with: ADL's, functional mobility, safety, upper extremity strength, adaptive techniques and equipment, ego support, and community reintegration.   Goals are: Min A. Therapy may proceed with showering this patient. 10. SLP will assess and treat for/with:  speech.  Goals are: speech, language. 11. Case Management and Social Worker will assess and treat for psychological issues and discharge planning. 12. Team conference will be held weekly to assess progress toward goals and to determine barriers to discharge. 13. Patient will receive at least 3 hours of therapy per day at least 5 days per week. 14. ELOS: 15-19 days.       15. Prognosis:  good  I have personally performed a face to face diagnostic evaluation, including, but not limited to relevant history and physical exam findings, of this patient and developed relevant assessment and plan.  Additionally, I have reviewed and concur with the physician assistant's documentation above.  The patient's status has not changed. The original post admission physician evaluation remains appropriate, and any changes from the pre-admission screening or documentation from the acute chart are noted above.    Delice Lesch, MD, ABPMR Lavon Paganini Angiulli, PA-C 11/15/2017

## 2017-11-15 NOTE — PMR Pre-admission (Signed)
PMR Admission Coordinator Pre-Admission Assessment  Patient: Holly Flores is an 72 y.o., female MRN: [id number] DOB: October 28, 1945 Height: 5\' 1"  (154.9 cm) Weight: 95.9 kg              Insurance Information HMO:     PPO:      PCP:      IPA:      80/20:      OTHER:  PRIMARY: Medicare A & B      Policy#: [id number]      Subscriber: Self Pre-Cert#: Eligible       Employer: Retired Benefits:  Phone #: Verified online     Name: [employer]  Eff. Date: A:12/18/91, B:07/17/92     Deduct: $1364      Out of Pocket Max: N/A      Life Max: N/A CIR: 100%      SNF: 100% days 1-20; 80% days 21-100 Outpatient: 80%     Co-Pay: 20% Home Health: 100%      Co-Pay: $0 DME: 80%     Co-Pay: 20% Providers: Patient's Choice   SECONDARY11-16-1996       Policy#: Lurena Joiner      Subscriber: Self Employer: Retired    Benefits:  Phone #: [phone]      Emergency [place]    Name [place] E Spouse [phone]  [phone]   Arlesia, Kiel   Costella Hatcher   [phone] Daughter   [phone]   Neilah, Fulwider Granddaughter   [phone]     Current Medical History  Patient Admitting Diagnosis: Acute infarct left external capsule posteriorly   History of Present Illness: Holly Flores is a 72 year old right-handed African-American female with history of diastolic congestive heart failure, PAF maintained on Eliquis since July 2019 followed by Dr. August 2019, CKD stage II, COPD, hypertension hyperlipidemia.  Per chart review, patient and husband patient lives with spouse.  Independent prior to admission and driving.  One level home with 3 steps to entry.  Good support of extended family.  Husband is receiving treatment for leukemia at [facility] hospital but can assist.  Presented 10/29/2017 to [facility] with right sided weakness, diplopia and difficulty speaking.  Troponin negative.  MRI reviewed, showing left brain  infarction.  Per CT and MRI report, acute infarction left external capsule posteriorly.  Chronic microhemorrhage left frontal white matter adjacent to the head of the caudate.  CT angiogram of head and neck with no large vessel occlusion.  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  Echocardiogram with ejection fraction of 65% no wall motion abnormalities without thrombus.  Patient did not receive TPA.  Follow-up neurology services.  Patient's chronic Eliquis initially held to avoid hemorrhagic transformation initially placed on aspirin and Eliquis later resumed 11/01/2017 and aspirin at that time discontinued.  She was on mechanical soft diet.  Patient was admitted to inpatient rehab services 11/01/2017.  Patient progressing slowly with therapies.  Developed diffuse abdominal discomfort worse on left lower quadrant 11/08/2017.  KUB completed concerning for possible ileus.  Her diet was changed to full liquids.  Noted significant hypokalemia 2.7 received intravenous supplement.  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  C. difficile specimen negative.  CT of the abdomen 11/09/2017 showed fluid-filled distended colon with transverse colon measuring 13 cm.  No distal obstructing lesion noted.  There was an  area of questionable narrowing of the splenic flexure of the colon.  Patient was discharged to acute care services for further evaluation 11/09/2017.  Gastroenterology services consulted and advised conservative care fleets enema and twice a day MiraLAX was ordered.  Narrowing of splenic flexure most likely peristalsis and monitored.  Hypokalemia improving 3.5 11/15/2017.  Patient remains on Eliquis for history of CVA/ PAF.  Latest abdominal film shows mild improvement.  Patient with no further bouts of nausea or vomiting.  Diet has been advanced to mechanical soft which was her diet prior to ileus.  Therapy  evaluations completed with recommendations to resume inpatient rehab services.  Patient was re-admitted for completion of her comprehensive rehab program 11/15/17.  Complete NIHSS TOTAL: 13    Past Medical History  Past Medical History:  Diagnosis Date  . Anxiety   . Arthritis   . CHF (congestive heart failure) ([place])   . COPD (chronic obstructive pulmonary disease) ([place])   . Depression   . GERD (gastroesophageal reflux disease)   . Goiter   . Hyperlipidemia   . Hypertension   . Hypothyroidism   . Kidney cysts    left side  . Myocardial infarction ([place])    8 yrs ago.   . 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 Shortness of breath   . Sleep apnea    uses CPAP, 3  . Stroke ([place])   . 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    Family History  family history includes Alcoholism in her other; Asthma in her other; Cancer in her other; Diabetes in her other; Heart disease in her other; Hypercholesterolemia in her other; Hypertension in her other; Rheumatologic disease in her other; Stroke in her other; Thyroid disease in her other.  Prior Rehab/Hospitalizations:  Has the patient had major surgery during 100 days prior to admission? No, prior to the initial hospital stay at [facility]  Current Medications   Current Facility-Administered Medications:  .  acetaminophen (TYLENOL) tablet 650 mg, 650 mg, Oral, Q6H PRN, 650 mg at 11/14/17 2042 **OR** [DISCONTINUED] acetaminophen (TYLENOL) suppository 650 mg, 650 mg, Rectal, Q6H PRN, Danford, Suann Larry, MD .  ALPRAZolam Duanne Moron) tablet 1 mg, 1 mg, Oral, QHS, Danford, Suann Larry, MD, 1 mg at 11/14/17 2346 .  apixaban (ELIQUIS) tablet 5 mg, 5 mg, Oral, BID, Danford, Suann Larry, MD, 5 mg at 11/15/17 0903 .  atorvastatin (LIPITOR) tablet 80 mg, 80 mg, Oral, Daily, Danford, Suann Larry, MD, 80 mg at 11/15/17 0903 .  bisacodyl (DULCOLAX) suppository 10 mg, 10 mg, Rectal, Daily, Thurnell Lose, MD, 10 mg at 11/15/17 1135 .   levothyroxine (SYNTHROID, LEVOTHROID) tablet 50 mcg, 50 mcg, Oral, QAC breakfast, Danford, Suann Larry, MD, 50 mcg at 11/15/17 0903 .  pantoprazole (PROTONIX) EC tablet 40 mg, 40 mg, Oral, Daily, Danford, Suann Larry, MD, 40 mg at 11/15/17 0902 .  polyethylene glycol (MIRALAX / GLYCOLAX) packet 17 g, 17 g, Oral, BID, Brahmbhatt, Parag, MD, 17 g at 11/15/17 0902 .  potassium chloride SA (K-DUR,KLOR-CON) CR tablet 40 mEq, 40 mEq, Oral, Once, Thurnell Lose, MD  Patients Current Diet:  Diet Order            DIET SOFT Room service appropriate? Yes; Fluid consistency: Thin  Diet effective now        Diet - low sodium heart healthy              Precautions / Restrictions Precautions  Precautions: Fall Precaution Comments: R hemi, R inattention Restrictions Weight Bearing Restrictions: No   Has the patient had 2 or more falls or a fall with injury in the past year?Yes  Prior Activity Level Community (5-7x/wk): Prior to admission for acute CVA patient was fully independent at home, out daily, and driving.  She reports using a cane of wheelchair if she was out in the community depending on the distance.    Home Assistive Devices / Equipment Home Assistive Devices/Equipment: Oxygen, Hospital bed, Eyeglasses, Dentures (specify type) Home Equipment: Cane - single point, Environmental consultant - 2 wheels, Shower seat, Bedside commode, Wheelchair - manual  Prior Device Use: Indicate devices/aids used by the patient prior to current illness, exacerbation or injury? She reports using a cane or wheelchair if she was out in the community for long distances.   Prior Functional Level Prior Function Level of Independence: Independent, Independent with assistive device(s) Gait / Transfers Assistance Needed: at CIR: required use of Stedy and w/c for transfers and mobility Comments: Previous CIR stay required the use of a Stedy   Self Care: Did the patient need help bathing, dressing, using the toilet or  eating? Independent  Indoor Mobility: Did the patient need assistance with walking from room to room (with or without device)? Independent  Stairs: Did the patient need assistance with internal or external stairs (with or without device)? Independent  Functional Cognition: Did the patient need help planning regular tasks such as shopping or remembering to take medications? Independent  Current Functional Level Cognition  Overall Cognitive Status: Within Functional Limits for tasks assessed Orientation Level: Oriented X4    Extremity Assessment (includes Sensation/Coordination)  Upper Extremity Assessment: Defer to OT evaluation  Lower Extremity Assessment: RLE deficits/detail, LLE deficits/detail RLE Deficits / Details: unable to lift R LE against gravity - requires physical assist for all mobility of LE as well as hip/trunk righting LLE Deficits / Details: WFL    ADLs       Mobility  Overal bed mobility: Needs Assistance Bed Mobility: Supine to Sit Rolling: Mod assist(R <> L) Supine to sit: Mod assist, HOB elevated General bed mobility comments: assist to bring R LE to EOB and to elevate trunk into sitting; cues for sequencing     Transfers  Overall transfer level: Needs assistance Equipment used: 2 person hand held assist Transfer via Lift Equipment: Stedy Transfers: Sit to/from [employer] to Stand: Min assist, Mod assist, +2 safety/equipment General transfer comment: pt stood X2 with Stedy and X2 without; min A to power up and to gain balance with use of Stedy standing frame and mod A +2 for stand without AD with R knee blocked and R UE supported    Ambulation / Gait / Stairs / Wheelchair Mobility  Ambulation/Gait General Gait Details: deferred for patient therapist safety    Posture / Balance Balance Overall balance assessment: Needs assistance Sitting-balance support: Feet supported, Single extremity supported Sitting balance-Leahy Scale: Fair Standing balance  support: Single extremity supported, During functional activity Standing balance-Leahy Scale: Poor Standing balance comment: reliant on external support; cues needed for weight shifting and midline posturing in standing     Special needs/care consideration BiPAP/CPAP: Yes, 2L o2 or CPAP at night  CPM: No Continuous Drip IV: No Dialysis: No         Life Vest: No Oxygen: No Special Bed: No Trach Size: No Wound Vac (area): No       Skin: WDL  Bowel mgmt: Continent, last documented BM 11/14/17 (needs to have a BM daily) Bladder mgmt: Continent but also documented use of external foley  Diabetic mgmt: No     Previous Home Environment Living Arrangements: Spouse/significant other Available Help at Discharge: Family Type of Home: [place] Name: 4th floor rehab Home Layout: One level Home Access: Stairs to enter Entrance Stairs-Rails: None Entrance Stairs-Number of Steps: 2-3 steps to enter house with rails, 2 steps down to den but pt reports she does not have to go down there, pt states husband can install L rail at home entry [employer] Shower/Tub: Multimedia programmer: Associate Professor Accessibility: Yes [place]: No Additional Comments: home environment gleaned from chart review - hopeful return to CIR at discharge  Discharge Living Setting Plans for Discharge Living Setting: Patient's home, Lives with (comment)(Spouse and son lives next door) Type of Home at Discharge: House Discharge Home Layout: One level Discharge Home Access: Stairs to enter Entrance Stairs-Rails: None Entrance Stairs-Number of Steps: 5 Discharge Bathroom Shower/Tub: Tub/shower unit, Curtain Discharge Bathroom Toilet: Standard Discharge Bathroom Accessibility: Yes How Accessible: Accessible via walker Does the patient have any problems obtaining your medications?: No  Social/Family/Support Systems Patient Roles: Spouse, Parent, Other  (Comment) Contact Information: see above  Anticipated Caregiver: Spouse, daughter, granddaughter (son lives very nearby) Equities trader Information: see above  Ability/Limitations of Caregiver: Spouse can do light assist  Caregiver Availability: 24/7 Discharge Plan Discussed with Primary Caregiver: Yes Is Caregiver In Agreement with Plan?: Yes Does Caregiver/Family have Issues with Lodging/Transportation while Pt is in Rehab?: No  Goals/Additional Needs Patient/Family Goal for Rehab: SLP: Mod I; PT/OT: Min A Expected length of stay: was 15-20 days Cultural Considerations: None Dietary Needs: Soft solids and thin liquids  Equipment Needs: TBD Pt/Family Agrees to Admission and willing to participate: Yes Program Orientation Provided & Reviewed with Pt/Caregiver Including Roles  & Responsibilities: Yes  Barriers to Discharge: Medical stability, Home environment access/layout  Decrease burden of Care through IP rehab admission: No  Possible need for SNF placement upon discharge: No  Patient Condition: Patient is known to our service from her previous admission on 11/01/17 following her CVA; however, she discharged off to acute care 11/09/17 due to medical issues.  Patient's records were reviewed today and acute MD reports medical readiness.  Discussed with Dr. Posey Pronto and plan to proceed with re-admission to resume IP Rehab program 11/15/17.    Preadmission Screen Completed By:  Gunnar Fusi, 11/15/2017 1:26 PM ______________________________________________________________________   Discussed status with Dr. Posey Pronto on 11/15/17 at 1215 and received telephone approval for admission today.  Admission Coordinator:  Gunnar Fusi, time 1215/Date 11/15/17

## 2017-11-15 NOTE — Progress Notes (Signed)
Pt admitted to 4M09. Pt alert and oriented and complains of no pain. Pt oriented to safety plan and call bell is within reach. Continue plan of care.

## 2017-11-15 NOTE — Progress Notes (Signed)
Inpatient Rehabilitation  I have received acute medical clearance to re-admit patient to IP Rehab to complete her rehab course following her CVA.  I have notified patient, family, and team.  Call if questions.   Carmelia Roller., CCC/SLP Admission Coordinator  [place]  Cell [phone]

## 2017-11-15 NOTE — Progress Notes (Signed)
CSW notes patient is discharging to CIR. CSW signing off.   Percell Locus Emilianna Barlowe LCSW [phone]

## 2017-11-16 ENCOUNTER — Inpatient Hospital Stay (HOSPITAL_COMMUNITY): Payer: Medicare Other

## 2017-11-16 ENCOUNTER — Inpatient Hospital Stay (HOSPITAL_COMMUNITY): Payer: Medicare Other | Admitting: Speech Pathology

## 2017-11-16 DIAGNOSIS — K598 Other specified functional intestinal disorders: Secondary | ICD-10-CM

## 2017-11-16 DIAGNOSIS — I639 Cerebral infarction, unspecified: Secondary | ICD-10-CM

## 2017-11-16 DIAGNOSIS — I69351 Hemiplegia and hemiparesis following cerebral infarction affecting right dominant side: Principal | ICD-10-CM

## 2017-11-16 DIAGNOSIS — I480 Paroxysmal atrial fibrillation: Secondary | ICD-10-CM

## 2017-11-16 LAB — COMPREHENSIVE METABOLIC PANEL
ALBUMIN: 2.7 g/dL — AB (ref 3.5–5.0)
ALK PHOS: 86 U/L (ref 38–126)
ALT: 40 U/L (ref 0–44)
AST: 22 U/L (ref 15–41)
Anion gap: 4 — ABNORMAL LOW (ref 5–15)
BUN: 11 mg/dL (ref 8–23)
CALCIUM: 8.7 mg/dL — AB (ref 8.9–10.3)
CHLORIDE: 120 mmol/L — AB (ref 98–111)
CO2: 15 mmol/L — AB (ref 22–32)
Creatinine, Ser: 1.13 mg/dL — ABNORMAL HIGH (ref 0.44–1.00)
GFR calc Af Amer: 55 mL/min — ABNORMAL LOW (ref >60)
GFR calc non Af Amer: 48 mL/min — ABNORMAL LOW (ref >60)
GLUCOSE: 97 mg/dL (ref 70–99)
Potassium: 3.7 mmol/L (ref 3.5–5.1)
SODIUM: 139 mmol/L (ref 135–145)
Total Bilirubin: 0.3 mg/dL (ref 0.3–1.2)
Total Protein: 6.2 g/dL — ABNORMAL LOW (ref 6.5–8.1)

## 2017-11-16 LAB — CBC WITH DIFFERENTIAL/PLATELET
ABS IMMATURE GRANULOCYTES: 0 10*3/uL (ref 0.0–0.1)
BASOS ABS: 0.1 10*3/uL (ref 0.0–0.1)
BASOS PCT: 1 %
Eosinophils Absolute: 0.5 10*3/uL (ref 0.0–0.7)
Eosinophils Relative: 5 %
HCT: 39 % (ref 36.0–46.0)
HEMOGLOBIN: 12.6 g/dL (ref 12.0–15.0)
Immature Granulocytes: 0 %
LYMPHS PCT: 33 %
Lymphs Abs: 3.4 10*3/uL (ref 0.7–4.0)
MCH: 30.3 pg (ref 26.0–34.0)
MCHC: 32.3 g/dL (ref 30.0–36.0)
MCV: 93.8 fL (ref 78.0–100.0)
Monocytes Absolute: 0.8 10*3/uL (ref 0.1–1.0)
Monocytes Relative: 8 %
NEUTROS ABS: 5.4 10*3/uL (ref 1.7–7.7)
Neutrophils Relative %: 53 %
PLATELETS: 245 10*3/uL (ref 150–400)
RBC: 4.16 MIL/uL (ref 3.87–5.11)
RDW: 14.3 % (ref 11.5–15.5)
WBC: 10.1 10*3/uL (ref 4.0–10.5)

## 2017-11-16 NOTE — Evaluation (Signed)
Physical Therapy Assessment and Plan  Patient Details  Name: Holly Flores MRN: [id number] Date of Birth: [birth year]  PT Diagnosis: Abnormal posture, Difficulty walking, Hemiparesis dominant and Muscle weakness Rehab Potential: Good ELOS: 3 weeks   Today's Date: 11/16/2017 PT Individual Time: 1445-1610 PT Individual Time Calculation (min): 85 min    Problem List:  Patient Active Problem List   Diagnosis Date Noted  . Abdominal distention   . Ileus ([place])   . 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 Hemiparesis affecting right side as late effect of stroke ([place])   . Abdominal distension   . Acute kidney injury superimposed on chronic kidney disease ([place])   . 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 Acute CVA (cerebrovascular accident) ([place])   . Slow transit constipation   . CKD (chronic kidney disease), stage II   . PAF (paroxysmal atrial fibrillation) ([place])   . Dysphasia, post-stroke   . Right flaccid hemiplegia ([place])   . COPD (chronic obstructive pulmonary disease) ([place]) 10/29/2017  . Chronic diastolic CHF (congestive heart failure) ([place]) 10/29/2017  . Hypothyroidism 10/29/2017  . Essential hypertension 10/29/2017  . GERD (gastroesophageal reflux disease) 10/29/2017  . Anxiety 10/29/2017  . Taking medication for chronic disease 05/18/2017  . Central retinal vein occlusion of left eye 01/22/2015  . Degenerative disc disease, cervical 01/22/2015  . HA (headache) 12/30/2014  . CVA (cerebral vascular accident) ([place]) 12/30/2014  . CVA (cerebral infarction) 12/30/2014  . History of colonic polyps 05/14/2014  . 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 Chest pain 04/20/2013  . Dyspnea 04/20/2013  . Hyperlipidemia 04/20/2013    Past Medical History:  Past Medical History:  Diagnosis Date  . Anxiety   . Arthritis   . CHF (congestive heart failure) ([place])   . COPD (chronic obstructive pulmonary disease) ([place])   . Depression   . GERD (gastroesophageal  reflux disease)   . Goiter   . Hyperlipidemia   . Hypertension   . Hypothyroidism   . Kidney cysts    left side  . Myocardial infarction ([place])    8 yrs ago.   . 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 Shortness of breath   . Sleep apnea    uses CPAP, 3  . Stroke ([place])   . Stroke ([place])    OCULAR  LEFT  EYE    LAST YR.   Past Surgical History:  Past Surgical History:  Procedure Laterality Date  . ABDOMINAL HYSTERECTOMY    . BACK SURGERY     x3,cervical and lunmbar, disc.  Marland Kitchen BREAST BIOPSY     left-non cancerous  . CARDIAC CATHETERIZATION    . CARPAL TUNNEL RELEASE Left   . CATARACT EXTRACTION W/PHACO  09/21/2011   Procedure: CATARACT EXTRACTION PHACO AND INTRAOCULAR LENS PLACEMENT (IOC);  Surgeon: Williams Che, MD;  Location: [facility];  Service: Ophthalmology;  Laterality: Left;  CDE:9.78  . CATARACT EXTRACTION W/PHACO Right 10/15/2014   Procedure: CATARACT EXTRACTION PHACO AND INTRAOCULAR LENS PLACEMENT ([place]);  Surgeon: Williams Che, MD;  Location: [facility];  Service: Ophthalmology;  Laterality: Right;  CDE:4.19  . CERVICAL SPINE SURGERY    . CESAREAN SECTION     x2  . CHOLECYSTECTOMY    . COLONOSCOPY  2006   RMR: 1. Internal hemorroids, otherwise normal rectum. 2. Pedunculated polyp at 35 cm. reomved with snare. The remainder of teh colonic mucosa appeared normal.   . COLONOSCOPY  2011   Dr. Gala Romney: multiple ascending colon polyps and  rectal polyp, adenomatous  . COLONOSCOPY N/A 05/31/2014   Procedure: COLONOSCOPY;  Surgeon: Daneil Dolin, MD;  Location[facility];  Service: Endoscopy;  Laterality: N/A;  130  . COLONOSCOPY WITH PROPOFOL N/A 06/24/2017   Procedure: COLONOSCOPY WITH PROPOFOL;  Surgeon: Daneil Dolin, MD;  [facility];  Service: Endoscopy;  Laterality: N/A;  2:15pm  . ESOPHAGOGASTRODUODENOSCOPY  2006   Dr. Gala Romney: Subtle Schatzkis ring, otherwise normal upper GI tract, aside from a small pyloric channell erosion, status post dilation as described  above.   Marland Kitchen FLEXIBLE SIGMOIDOSCOPY N/A 11/11/2017   Procedure: FLEXIBLE SIGMOIDOSCOPY;  Surgeon: Otis Brace, MD;  Location: [facility];  Service: Gastroenterology;  Laterality: N/A;  . KNEE SURGERY Bilateral   . POLYPECTOMY  06/24/2017   Procedure: POLYPECTOMY;  Surgeon: Daneil Dolin, MD;  [facility];  Service: Endoscopy;;  ascending  . TONSILLECTOMY    . TOTAL KNEE ARTHROPLASTY Right 05/24/2013   DR MURPHY  . TOTAL KNEE ARTHROPLASTY Right 05/24/2013   Procedure: TOTAL KNEE ARTHROPLASTY;  Surgeon: [name], MD;  Location: [facility];  Service: Orthopedics;  Laterality: Right;    Assessment & Plan Clinical Impression: Patient is a 72 y.o. year old female with history of diastolic congestive heart failure, PAF maintained on Eliquis since July 2019 followed by Dr. Carlyle Dolly, CKD stage II, COPD, hypertension hyperlipidemia.  Per chart review and patient, patient lives with spouse.  Independent prior to admission and driving.  One level home with 3 steps to entry.  Good support of extended family.  Husband is receiving treatment for leukemia at [facility] hospital but can assist.  [facility]ed [facility] to [facility] with right sided weakness, diplopia and difficulty speaking.  Troponin negative.  MRI reviewed, showing left brain infarction.  Per CT and MRI report, acute infarction left external capsule posteriorly.  Chronic microhemorrhage left frontal white matter adjacent to the head of the caudate.  CT angiogram of head and neck with no large vessel occlusion.  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  Echocardiogram with ejection fraction of 65% no wall motion abnormalities without thrombus.  Patient did not receive TPA.  Follow-up neurology services.  Patient's chronic Eliquis initially held to avoid hemorrhagic transformation initially placed on aspirin and Eliquis later resumed 11/01/2017 and aspirin at  that time discontinued.  She was on mechanical soft diet.  Patient was admitted to inpatient rehab services 11/01/2017.  Patient progressing slowly with therapies.  Developed  diffuse abdominal discomfort worse on left lower quadrant 11/08/2017.  KUB completed concerning for possible ileus.  Her diet was changed to full liquids.  Noted significant hypokalemia 2.7 received intravenous supplement.  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  C. difficile specimen negative.  CT of the abdomen 11/09/2017 showed fluid-filled distended colon with transverse colon measuring 13 cm.  No distal obstructing lesion noted.  There was an area of questionable narrowing of the splenic flexure of the colon.  Patient was discharged to acute care services for further evaluation 11/09/2017.  Gastroenterology services consulted and advised conservative care fleets enema and twice a day MiraLAX was ordered.  Narrowing of splenic flexure most likely peristalsis and monitored.  Hypokalemia improving 3.5 11/15/2017.  Patient remains on Eliquis for history of CVA/ PAF.  Latest abdominal film shows mild improvement.  Patient with no further bouts of nausea or vomiting.  Diet has been  advanced to mechanical soft which was her diet prior to ileus.  Therapy evaluations completed with recommendations to resume inpatient rehab services.  Patient was admitted for a comprehensive rehab program.  Patient transferred to CIR on 11/15/2017 .   Patient currently requires max with mobility secondary to muscle weakness and muscle joint tightness, impaired timing and sequencing, decreased coordination and decreased motor planning, decreased attention, decreased awareness and decreased memory and decreased standing balance, decreased postural control, hemiplegia and decreased balance strategies.  Prior to hospitalization, patient was independent  with mobility and lived with Spouse, Other (Comment)(granddaughter moving in to  help) in a House home.  Home access is 2-3 steps to enter house with rails, 2 steps down to den but pt reports she does not have to go down there, pt states husband can install L rail at home entryStairs to enter.  Patient will benefit from skilled PT intervention to maximize safe functional mobility, minimize fall risk and decrease caregiver burden for planned discharge home with 24 hour assist.  Anticipate patient will benefit from follow up [facility] at discharge.  PT - End of Session Activity Tolerance: Decreased this session;Tolerates 30+ min activity with multiple rests Endurance Deficit: Yes Endurance Deficit Description: generalized deconditioning PT Assessment Rehab Potential (ACUTE/IP ONLY): Good PT Barriers to Discharge: Inaccessible home environment;Decreased caregiver support;Home environment access/layout PT Barriers to Discharge Comments: steps to enter, unsure if pt will have 24/7 care PT Patient demonstrates impairments in the following area(s): Balance;Sensory;Behavior;Skin Integrity;Edema;Endurance;Motor;Nutrition;Pain;Perception;Safety PT Transfers Functional Problem(s): Bed Mobility;Car;Furniture;Bed to Chair PT Locomotion Functional Problem(s): Ambulation;Wheelchair Mobility;Stairs PT Plan PT Intensity: Minimum of 1-2 x/day ,45 to 90 minutes PT Frequency: 5 out of 7 days PT Duration Estimated Length of Stay: 3 weeks PT Treatment/Interventions: Cognitive remediation/compensation;Discharge planning;Ambulation/gait training;DME/adaptive equipment instruction;Functional mobility training;Pain management;Psychosocial support;Splinting/orthotics;Therapeutic Activities;UE/LE Strength taining/ROM;Visual/perceptual remediation/compensation;Wheelchair propulsion/positioning;UE/LE Coordination activities;Therapeutic Exercise;Stair training;Skin care/wound management;Patient/family education;Neuromuscular re-education;Functional electrical stimulation;Disease management/prevention;Community  reintegration;Balance/vestibular training PT Transfers Anticipated Outcome(s): min assist PT Locomotion Anticipated Outcome(s): supervision w/c level PT Recommendation Recommendations for Other Services: Speech consult;Neuropsych consult Follow Up Recommendations: Home health PT Patient destination: Home Equipment Recommended: To be determined;Wheelchair (measurements) Equipment Details: 20x18 w/c  Skilled Therapeutic Intervention Evaluation completed (see details above and below) with education on PT POC and goals and individual treatment initiated with focus on functional transfers, bed mobility, w/c positioning and standing balance. Pt propelled w/c from room to gym with mod assist to help with steering, therapist educating pt on L hemi technique. Pt transferred from w/c>mat towards the R with max assist, squat pivot. Pt performed bed mobility on the mat, sit>supine with mod assist and supine>sitting with max assist. Pt maintained static sitting balance with supervision while therapist retrieved w/c and lowered w/c seat to floor height, therapist added cushion and adjusted leg rests to allow for better positioning. Pt performed slideboard transfer from mat back to w/c with max assist,verbal cues for techniques and manual facilitation for weightshifting. Pt performed sit<>stands at the rail this session x mod-max assist. Pt pulling up on the rail with mod assist vs pt pushing up from arm rest with max assist. In standing pt able to maintain static balance without UE support and min assist however keeps 80% of weight shifted over L LE. Worked on lateral weightshifting in standing, pts R LE buckles with increased weightbearing. Pt worked on stepping in place with L LE however R LE requires total assist to prevent buckling. Pt transported back to room and transferred to bed with stedy. Pt transferred to supine  with mod assist and left with needs in reach and chair alarm set.   PT  Evaluation Precautions/Restrictions Precautions Precautions: Fall Precaution Comments: R hemi, R inattention Restrictions Weight Bearing Restrictions: No General   Vital SignsTherapy Vitals Temp: 98.3 F (36.8 C) Pulse Rate: 77 Resp: 17 BP: 120/81 Patient Position (if appropriate): Sitting Oxygen Therapy SpO2: 99 % O2 Device: Room Air Pain Pain Assessment Pain Scale: 0-10 Pain Score: 0-No pain Home Living/Prior Functioning Home Living Available Help at Discharge: Family Type of Home: House Home Access: Stairs to enter [employer] of Steps: 2-3 steps to enter house with rails, 2 steps down to den but pt reports she does not have to go down there, pt states husband can install L rail at home entry Entrance Stairs-Rails: None Home Layout: One level Bathroom Shower/Tub: Multimedia programmer: Standard Bathroom Accessibility: Yes Additional Comments: pt owns [place], w/c, RW, cane  Lives With: Spouse;Other (Comment)(granddaughter moving in to help) Prior Function Level of Independence: Independent with basic ADLs;Independent with homemaking with ambulation;Independent with gait;Independent with transfers  Able to Take Stairs?: Yes Driving: Yes Vocation: Retired Comments: enjoys Press photographer, Dietitian for holidays Vision/Perception  Vision - Assessment Eye Alignment: Impaired (comment)(R eye slightly off center) Ocular Range of Motion: Within Functional Limits Alignment/Gaze Preference: Within Defined Limits Tracking/Visual Pursuits: Decreased smoothness of horizontal tracking;Decreased smoothness of vertical tracking Saccades: Additional head turns occurred during testing;Decreased speed of saccadic movement Convergence: Within functional limits Additional Comments: Diplopia not currently present but pt reports hx. peripheral vision is limited bilaterally Perception Perception: Impaired Inattention/Neglect: Does not attend to right side of  body Praxis Praxis: Intact  Cognition Overall Cognitive Status: Impaired/Different from baseline Arousal/Alertness: Awake/alert Orientation Level: Oriented X4 Attention: Selective Sustained Attention: Appears intact Selective Attention: Appears intact Selective Attention Impairment: Functional complex;Verbal complex Memory: Impaired Memory Impairment: Decreased recall of new information Awareness: Impaired Awareness Impairment: Emergent impairment Problem Solving: Appears intact Problem Solving Impairment: Functional complex;Verbal complex Safety/Judgment: Appears intact Sensation Sensation Light Touch: Impaired Detail Central sensation comments: Reports sensation feels off, but able to localize with vision occluded R=L Light Touch Impaired Details: Impaired RLE Hot/Cold: Appears Intact Proprioception: Impaired Detail Proprioception Impaired Details: Absent RUE Coordination Gross Motor Movements are Fluid and Coordinated: No Fine Motor Movements are Fluid and Coordinated: No Heel Shin Test: unable to perform with R LE secondary to hemiparesis Motor  Motor Motor: Hemiplegia Motor - Skilled Clinical Observations: R hemiparesis, generalized deconditioning  Mobility Transfers Transfers: Sit to [employer] Transfers Sit to Stand: Maximal Assistance - Patient 25-49% Squat Pivot Transfers: Maximal Assistance - Patient 25-49% Transfer (Assistive device): None Trunk/Postural Assessment  Cervical Assessment Cervical Assessment: Within Functional Limits Thoracic Assessment Thoracic Assessment: Exceptions to WFL(rounded shoulders) Lumbar Assessment Lumbar Assessment: Exceptions to WFL(posterior pelvic tilt) Postural Control Postural Control: Deficits on evaluation Righting Reactions: delayed and inadequate  Balance Balance Balance Assessed: Yes Static Sitting Balance Static Sitting - Balance Support: Feet supported;Right upper extremity supported Static Sitting -  Level of Assistance: 4: Min assist Dynamic Sitting Balance Dynamic Sitting - Balance Support: Feet supported;No upper extremity supported Dynamic Sitting - Level of Assistance: 3: Mod assist Dynamic Sitting - Balance Activities: Reaching for objects Static Standing Balance Static Standing - Balance Support: No upper extremity supported Static Standing - Level of Assistance: 2: Max assist Extremity Assessment  RUE Assessment RUE Assessment: Exceptions to [facility] Passive Range of Motion (PROM) Comments: WFL Active Range of Motion (AROM) Comments: flaccid General Strength Comments: flaccid RUE Body System: Neuro  Brunstrum levels for arm and hand: Hand;Arm Brunstrum level for arm: Stage I Presynergy Brunstrum level for hand: Stage I Flaccidity LUE Assessment LUE Assessment: Within Functional Limits RLE Assessment RLE Assessment: Exceptions to [facility] Passive Range of Motion (PROM) Comments: DF lacking 3 degrees from neutral, knee flexion limited to 100 degrees, hip extension limited to neutral  General Strength Comments: flaccid RLE Strength Right Hip Flexion: 0/5 Right Hip Extension: 0/5 Right Knee Flexion: 0/5 Right Knee Extension: 1/5 Right Ankle Dorsiflexion: 0/5 Right Ankle Plantar Flexion: 0/5 LLE Assessment LLE Assessment: Within Functional Limits General Strength Comments: grossly 4/5 throughout     Refer to Care Plan for Long Term Goals  Recommendations for other services: None   Discharge Criteria: Patient will be discharged from PT if patient refuses treatment 3 consecutive times without medical reason, if treatment goals not met, if there is a change in medical status, if patient makes no progress towards goals or if patient is discharged from hospital.  The above assessment, treatment plan, treatment alternatives and goals were discussed and mutually agreed upon: by patient  Netta Corrigan, PT, DPT 11/16/2017, 3:00 PM

## 2017-11-16 NOTE — Progress Notes (Signed)
Social Work Assessment and Plan   Patient Details  Name: Holly Flores MRN: [id number] Date of Birth: April 26, 1945   Today's Date: 11/16/2017   Problem List:      Patient Active Problem List    Diagnosis Date Noted  . Left middle cerebral artery stroke ([place]) 11/01/2017  . Acute CVA (cerebrovascular accident) ([place])    . Slow transit constipation    . CKD (chronic kidney disease), stage II    . PAF (paroxysmal atrial fibrillation) ([place])    . Dysphasia, post-stroke    . Right flaccid hemiplegia ([place])    . COPD (chronic obstructive pulmonary disease) ([place]) 10/29/2017  . Chronic diastolic CHF (congestive heart failure) ([place]) 10/29/2017  . Hypothyroidism 10/29/2017  . Essential hypertension 10/29/2017  . GERD (gastroesophageal reflux disease) 10/29/2017  . Anxiety 10/29/2017  . Taking medication for chronic disease 05/18/2017  . Central retinal vein occlusion of left eye 01/22/2015  . Degenerative disc disease, cervical 01/22/2015  . HA (headache) 12/30/2014  . CVA (cerebral vascular accident) ([place]) 12/30/2014  . CVA (cerebral infarction) 12/30/2014  . History of colonic polyps 05/14/2014  . 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 Chest pain 04/20/2013  . Dyspnea 04/20/2013  . Hyperlipidemia 04/20/2013    Past Medical History:      Past Medical History:  Diagnosis Date  . Anxiety    . Arthritis    . CHF (congestive heart failure) ([place])    . COPD (chronic obstructive pulmonary disease) ([place])    . Depression    . GERD (gastroesophageal reflux disease)    . Goiter    . Hyperlipidemia    . Hypertension    . Hypothyroidism    . Kidney cysts      left side  . Myocardial infarction ([place])      8 yrs ago.   Marland Kitchen Shortness of breath    . Sleep apnea      uses CPAP, 3  . Stroke ([place])    . Stroke ([place])      OCULAR  LEFT  EYE    LAST YR.    Past Surgical History:  Past Surgical History:  Procedure Laterality Date  . ABDOMINAL HYSTERECTOMY      . BACK  SURGERY        x3,cervical and lunmbar, disc.  Marland Kitchen BREAST BIOPSY        left-non cancerous  . CARDIAC CATHETERIZATION      . CARPAL TUNNEL RELEASE Left    . CATARACT EXTRACTION W/PHACO   09/21/2011    Procedure: CATARACT EXTRACTION PHACO AND INTRAOCULAR LENS PLACEMENT (IOC);  Surgeon: Williams Che, MD;  Location: [facility];  Service: Ophthalmology;  Laterality: Left;  CDE:9.78  . CATARACT EXTRACTION W/PHACO Right 10/15/2014    Procedure: CATARACT EXTRACTION PHACO AND INTRAOCULAR LENS PLACEMENT ([place]);  Surgeon: Williams Che, MD;  Location: [facility];  Service: Ophthalmology;  Laterality: Right;  CDE:4.19  . CERVICAL SPINE SURGERY      . CESAREAN SECTION        x2  . CHOLECYSTECTOMY      . COLONOSCOPY   2006    RMR: 1. Internal hemorroids, otherwise normal rectum. 2. Pedunculated polyp at 35 cm. reomved with snare. The remainder of teh colonic mucosa appeared normal.   . COLONOSCOPY   2011    Dr. Gala Romney: multiple ascending colon polyps and rectal polyp, adenomatous  . COLONOSCOPY N/A 05/31/2014    Procedure: COLONOSCOPY;  Surgeon: Daneil Dolin,  MD;  [facility];  Service: Endoscopy;  Laterality: N/A;  130  . COLONOSCOPY WITH PROPOFOL N/A 06/24/2017    Procedure: COLONOSCOPY WITH PROPOFOL;  Surgeon: Daneil Dolin, MD;  [facility];  Service: Endoscopy;  Laterality: N/A;  2:15pm  . ESOPHAGOGASTRODUODENOSCOPY   2006    Dr. Gala Romney: Subtle Schatzkis ring, otherwise normal upper GI tract, aside from a small pyloric channell erosion, status post dilation as described above.   Marland Kitchen KNEE SURGERY Bilateral    . POLYPECTOMY   06/24/2017    Procedure: POLYPECTOMY;  Surgeon: Daneil Dolin, MD;  [facility];  Service: Endoscopy;;  ascending  . TONSILLECTOMY      . TOTAL KNEE ARTHROPLASTY Right 05/24/2013    DR MURPHY  . TOTAL KNEE ARTHROPLASTY Right 05/24/2013    Procedure: TOTAL KNEE ARTHROPLASTY;  Surgeon: [name], MD;  Location: [facility];  Service: Orthopedics;   Laterality: Right;    Social History:  reports that she quit smoking about 30 years ago. Her smoking use included cigarettes. She has a 5.00 pack-year smoking history. She has never used smokeless tobacco. She reports that she drank alcohol. She reports that she does not use drugs.   Family / Support Systems Marital Status: Married How Long?: 54 years Patient Roles: Spouse, Parent, Other (Comment)(grandparent; great grandparent) Spouse/Significant Other: Raffaella Edison - husband - [phone] (h); [phone] (m) Children: Yazlyn Wentzel - son - [phone]; Imo Cumbie - dtr - [phone] Other Supports: Jamariya Davidoff - granddtr - [phone] Anticipated Caregiver: Spouse, daughter, granddaughter (son lives very nearby) Ability/Limitations of Caregiver: Spouse can do light assist  Caregiver Availability: 24/7 Family Dynamics: close, supportive family   Social History Preferred language: English Religion: Baptist Read: Yes Write: Yes Employment Status: Retired Date Retired/Disabled/Unemployed: 30 Age Retired: 53 Public relations account executive Issues: none reported Guardian/Conservator: N/A - MD has determined that pt is capable of making her own decisions.    Abuse/Neglect Abuse/Neglect Assessment Can Be Completed: Yes Physical Abuse: Denies Verbal Abuse: Denies Sexual Abuse: Denies Exploitation of patient/patient's resources: Denies Self-Neglect: Denies   Emotional Status Pt's affect, behavior and adjustment status: Pt reports feeling well emotionally currently and that taking Xanax helps her with anxiety.  She stated she will let CSW/staff or family know if her mood worsens. Recent Psychosocial Issues: none reported Psychiatric History: Pt with hx of anxiety and depression. Substance Abuse History: none reported   Patient / Family Perceptions, Expectations & Goals Pt/Family understanding of illness & functional limitations: Pt/family report an  understanding of pt's condition/limitations. Premorbid pt/family roles/activities: Pt enjoys cooking and takes care of the inside of her house.  She drives and goes out daily.  She and her husband take drives and [place]. Anticipated changes in roles/activities/participation: Pt would like to resume activities as she is able. Pt/family expectations/goals: Pt wants to be able to cook again and to get her arm and leg working again.   Community [employer] Agencies: None Premorbid Home Care/DME Agencies: Other (Comment)(Pt had HH in the past for her knee surgery, but cannot remember the agency.  Pt has a cane, rolling walker, bedside commode, wheelchair, and shower seat at home.) Transportation available at discharge: family Resource referrals recommended: Neuropsychology, Support group (specify)   Discharge Planning Living Arrangements: Spouse/significant other Support Systems: Spouse/significant other, Children, Other relatives Type of Residence: Private residence Insurance Resources: [employer], Multimedia programmer (specify)([facility]) Financial Resources: Social  Security Financial Screen Referred: No Money Management: Patient, Spouse Does the patient have any problems obtaining your medications?: No Home Management: Pt takes care of inside household chores and husband takes care of outside chores. Patient/Family Preliminary Plans: Pt plans to go home with family to provide assistance as needed. Social Work Anticipated Follow Up Needs: HH/OP, Support Group(stroke support group) Expected length of stay: 3 weeks   Clinical Impression CSW met with pt, pt's husband, and pt's dtr to introduce self and role of CSW, as well as to complete assessment.  Pt has good family support and they will be with pt when she goes home.  Pt/family are glad that she was able to come to CIR and pt is motivated to get better.  She acknowledged that she feels she did better  during OT session, as her early morning PT session she did not feel good about.  Pt reports being tired, but feels okay emotionally.  Pt's granddtr is already beginning to prepare pt's home for her return.   Pt required transfer to acute on 11-09-17 and returned to CIR 11-15-17.  CSW welcomed pt back to CIR and told her that CSW would continue to follow her as before and assist as needed.  Pt expressed understanding. No current concerns/questions/needs at this time.     Amalie Koran, Silvestre Mesi 11/16/2017, 10:04 AM

## 2017-11-16 NOTE — Evaluation (Signed)
Speech Language Pathology Assessment and Plan  Patient Details  Name: Holly Flores MRN: [id number] Date of Birth: Sep 14, 1945  SLP Diagnosis: Dysarthria;Dysphagia  Rehab Potential: Good ELOS: 22-26 days     Today's Date: 11/16/2017 SLP Individual Time: 1000-1055 SLP Individual Time Calculation (min): 55 min   Problem List:  Patient Active Problem List   Diagnosis Date Noted  . Abdominal distention   . Ileus ([place])   . 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 Hemiparesis affecting right side as late effect of stroke ([place])   . Abdominal distension   . Acute kidney injury superimposed on chronic kidney disease ([place])   . 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 Acute CVA (cerebrovascular accident) ([place])   . Slow transit constipation   . CKD (chronic kidney disease), stage II   . PAF (paroxysmal atrial fibrillation) ([place])   . Dysphasia, post-stroke   . Right flaccid hemiplegia ([place])   . COPD (chronic obstructive pulmonary disease) ([place]) 10/29/2017  . Chronic diastolic CHF (congestive heart failure) ([place]) 10/29/2017  . Hypothyroidism 10/29/2017  . Essential hypertension 10/29/2017  . GERD (gastroesophageal reflux disease) 10/29/2017  . Anxiety 10/29/2017  . Taking medication for chronic disease 05/18/2017  . Central retinal vein occlusion of left eye 01/22/2015  . Degenerative disc disease, cervical 01/22/2015  . HA (headache) 12/30/2014  . CVA (cerebral vascular accident) ([place]) 12/30/2014  . CVA (cerebral infarction) 12/30/2014  . History of colonic polyps 05/14/2014  . 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 Chest pain 04/20/2013  . Dyspnea 04/20/2013  . Hyperlipidemia 04/20/2013   Past Medical History:  Past Medical History:  Diagnosis Date  . Anxiety   . Arthritis   . CHF (congestive heart failure) ([place])   . COPD (chronic obstructive pulmonary disease) ([place])   . Depression   . GERD (gastroesophageal reflux disease)   . Goiter   .  Hyperlipidemia   . Hypertension   . Hypothyroidism   . Kidney cysts    left side  . Myocardial infarction ([place])    8 yrs ago.   . 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 Shortness of breath   . Sleep apnea    uses CPAP, 3  . Stroke ([place])   . Stroke ([place])    OCULAR  LEFT  EYE    LAST YR.   Past Surgical History:  Past Surgical History:  Procedure Laterality Date  . ABDOMINAL HYSTERECTOMY    . BACK SURGERY     x3,cervical and lunmbar, disc.  Marland Kitchen BREAST BIOPSY     left-non cancerous  . CARDIAC CATHETERIZATION    . CARPAL TUNNEL RELEASE Left   . CATARACT EXTRACTION W/PHACO  09/21/2011   Procedure: CATARACT EXTRACTION PHACO AND INTRAOCULAR LENS PLACEMENT (IOC);  Surgeon: Williams Che, MD;  Location: [facility];  Service: Ophthalmology;  Laterality: Left;  CDE:9.78  . CATARACT EXTRACTION W/PHACO Right 10/15/2014   Procedure: CATARACT EXTRACTION PHACO AND INTRAOCULAR LENS PLACEMENT ([place]);  Surgeon: Williams Che, MD;  Location: [facility];  Service: Ophthalmology;  Laterality: Right;  CDE:4.19  . CERVICAL SPINE SURGERY    . CESAREAN SECTION     x2  . CHOLECYSTECTOMY    . COLONOSCOPY  2006   RMR: 1. Internal hemorroids, otherwise normal rectum. 2. Pedunculated polyp at 35 cm. reomved with snare. The remainder of teh colonic mucosa appeared normal.   . COLONOSCOPY  2011   Dr. Gala Romney: multiple ascending colon polyps and rectal polyp, adenomatous  . COLONOSCOPY  N/A 05/31/2014   Procedure: COLONOSCOPY;  Surgeon: Daneil Dolin, MD;  [facility];  Service: Endoscopy;  Laterality: N/A;  130  . COLONOSCOPY WITH PROPOFOL N/A 06/24/2017   Procedure: COLONOSCOPY WITH PROPOFOL;  Surgeon: Daneil Dolin, MD;  [facility];  Service: Endoscopy;  Laterality: N/A;  2:15pm  . ESOPHAGOGASTRODUODENOSCOPY  2006   Dr. Gala Romney: Subtle Schatzkis ring, otherwise normal upper GI tract, aside from a small pyloric channell erosion, status post dilation as described above.   Marland Kitchen FLEXIBLE SIGMOIDOSCOPY N/A  11/11/2017   Procedure: FLEXIBLE SIGMOIDOSCOPY;  Surgeon: Otis Brace, MD;  Location: [facility];  Service: Gastroenterology;  Laterality: N/A;  . KNEE SURGERY Bilateral   . POLYPECTOMY  06/24/2017   Procedure: POLYPECTOMY;  Surgeon: Daneil Dolin, MD;  [facility];  Service: Endoscopy;;  ascending  . TONSILLECTOMY    . TOTAL KNEE ARTHROPLASTY Right 05/24/2013   DR MURPHY  . TOTAL KNEE ARTHROPLASTY Right 05/24/2013   Procedure: TOTAL KNEE ARTHROPLASTY;  Surgeon: [name], MD;  Location: [facility];  Service: Orthopedics;  Laterality: Right;    Assessment / Plan / Recommendation Clinical Impression Patient is a 72 year old right-handed African-American female with history of diastolic congestive heart failure, PAF maintained on Eliquis since July 2019 followed by Dr. Carlyle Dolly, CKD stage II, COPD, hypertension hyperlipidemia.  Per chart review and patient, patient lives with spouse.  Independent prior to admission and driving.  One level home with 3 steps to entry.  Good support of extended family.  Husband is receiving treatment for leukemia at [facility] hospital but can assist.  Presented 10/29/2017 to [facility] with right sided weakness, diplopia and difficulty speaking.  Troponin negative.  MRI reviewed, showing left brain infarction.  Per CT and MRI report, acute infarction left external capsule posteriorly.  Chronic microhemorrhage left frontal white matter adjacent to the head of the caudate.  CT angiogram of head and neck with no large vessel occlusion.  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  Echocardiogram with ejection fraction of 65% no wall motion abnormalities without thrombus.  Patient did not receive TPA.  Follow-up neurology services.  Patient's chronic Eliquis initially held to avoid hemorrhagic transformation initially placed on aspirin and Eliquis later resumed 11/01/2017 and aspirin  at that time discontinued.  She was on mechanical soft diet.  Patient was admitted to inpatient rehab services 11/01/2017.  Patient progressing slowly with therapies.  Developed  diffuse abdominal discomfort worse on left lower quadrant 11/08/2017.  KUB completed concerning for possible ileus.  Her diet was changed to full liquids.  Noted significant hypokalemia 2.7 received intravenous supplement.  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  C. difficile specimen negative.  CT of the abdomen 11/09/2017 showed fluid-filled distended colon with transverse colon measuring 13 cm.  No distal obstructing lesion noted.  There was an area of questionable narrowing of the splenic flexure of the colon.  Patient was discharged to acute care services for further evaluation 11/09/2017.  Gastroenterology services consulted and advised conservative care fleets enema and twice a day MiraLAX was ordered.  Narrowing of splenic flexure most likely peristalsis and monitored.  Hypokalemia improving 3.5 11/15/2017.  Patient remains on Eliquis for history of CVA/ PAF.  Latest abdominal film shows mild improvement.  Patient with no further bouts of nausea or vomiting.  Diet has been advanced to mechanical soft which  was her diet prior to ileus.  Therapy evaluations completed with recommendations to resume inpatient rehab services.  Patient was admitted for a comprehensive rehab program 11/15/17.  Patient demonstrates a mild-moderate dysarthria characterized by decreased respiratory support, increased speech rate and imprecise consonants due to mild oral-motor weakness resulting in impaired intelligibility at the phrase level. Patient reports her cognitive functioning is at baseline, therefore, patient was administered the [facility] and scored 26/30 points with a score of 26 or above considered normal. Patient consumed trials of thin liquids via straw and Dys. 3 textures without overt s/s of aspiration and  efficient mastication. Trials of regular textures were not attempted due to patient being on a recommended soft diet due to ongoing GI issues. Will speak to physician prior to trials of upgraded textures. Patient would benefit from skilled SLP intervention to maximize her speech intelligibility and swallowing function prior to discharge.    Skilled Therapeutic Interventions          Administered a cognitive-linguistic evaluation and BSE, please see above for details. Educated patient in regards to her current speech impairments and goals of skilled SLP intervention, she verbalized understanding and agreement.   SLP Assessment  Patient will need skilled Speech Lanaguage Pathology Services during CIR admission    Recommendations  SLP Diet Recommendations: Thin;Dysphagia 3 (Mech soft) Liquid Administration via: Straw;Cup Medication Administration: Whole meds with liquid Supervision: Patient able to self feed;Intermittent supervision to cue for compensatory strategies Compensations: Minimize environmental distractions;Slow rate;Small sips/bites Postural Changes and/or Swallow Maneuvers: Seated upright 90 degrees Oral Care Recommendations: Oral care BID Recommendations for Other Services: Neuropsych consult Patient destination: Home Follow up Recommendations: None;24 hour supervision/assistance Equipment Recommended: None recommended by SLP    SLP Frequency 3 to 5 out of 7 days   SLP Duration  SLP Intensity  SLP Treatment/Interventions 22-26 days   Minumum of 1-2 x/day, 30 to 90 minutes  Cueing hierarchy;Functional tasks;Speech/Language facilitation;Dysphagia/aspiration precaution training    Pain Pain Assessment Pain Scale: 0-10 Pain Score: 0-No pain   Short Term Goals: Week 1: SLP Short Term Goal 1 (Week 1): Pt will consume regular texture trials without overt s/s aspiration with Mod I for use of swallowing compensatory strategies over 2 sessions prior to upgrade.  SLP Short Term  Goal 2 (Week 1): Patient will utilize speech intelligibility strategies at the phrase level with supervision verbal cues.  SLP Short Term Goal 3 (Week 1): Patient will utilize diaphragmatic breathing at the phrase level with Min A verbal cues.   Refer to Care Plan for Long Term Goals  Recommendations for other services: Neuropsych  Discharge Criteria: Patient will be discharged from SLP if patient refuses treatment 3 consecutive times without medical reason, if treatment goals not met, if there is a change in medical status, if patient makes no progress towards goals or if patient is discharged from hospital.  The above assessment, treatment plan, treatment alternatives and goals were discussed and mutually agreed upon: by patient  ,  11/16/2017, 12:30 PM

## 2017-11-16 NOTE — Progress Notes (Signed)
PMR Admission Coordinator Pre-Admission Assessment  Patient: Holly Flores is an 72 y.o., female MRN: [id number] DOB: December 13, 1945 Height: 5\' 1"  (154.9 cm) Weight: 95.9 kg                                                                                                                                                  Insurance Information HMO: PPO: PCP: IPA:80/20: OTHER:  PRIMARY:Medicare A & BPolicy#:[id number] Pre-Cert#:EligibleEmployer:Retired Benefits: Phone #:Verified onlineName:Passport One Portal Eff. Date:A:12/18/91, B:6/1/94Deduct:$1364Out of Pocket Max:N/ALife Max:N/A CIR:100%SNF:100% days 1-20; 80% days 21-100 Outpatient:80%Co-Pay:20% Home Health:100%Co-Pay:$0 DME:80%Co-Pay:20% Providers:Patient's Choice  SECONDARY:[place] VAPolicy#:[id number] Subscriber:Self Employer:RetiredBenefits: Phone [phone]  Emergency Contact Information         Contact Information    Name Relation Home Work Mobile   [place] E Spouse [place]   Lindsie, Simar   [phone]   Sheliah Plane Daughter   [phone]   Michie, Molnar Granddaughter   [phone]     Current Medical History  Patient Admitting Diagnosis: Acute infarct left external capsule posteriorly  History of Present Illness: Holly Flores is a 72 year old right-handed African-American female with history of diastolic congestive heart failure, PAF maintained on Eliquis since July 2019 followed by Dr. Carlyle Dolly, CKD stage II, COPD, hypertension hyperlipidemia. Per chart review, patient and husband patient lives with spouse. Independent prior to admission and driving. One level home with 3 steps to entry. Good support of extended family. Husband is receiving treatment for leukemia at [facility] but can assist.  Presented 10/29/2017 to [facility] with right sided weakness, diplopia and difficulty speaking. Troponin negative. MRI reviewed, showing left brain infarction. Per CT and MRI report, acute infarction left external capsule posteriorly. Chronic microhemorrhage left frontal white matter adjacent to the head of the caudate. CT angiogram of head and neck with no large vessel occlusion. 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 Echocardiogram with ejection fraction of 65% no wall motion abnormalities without thrombus. Patient did not receive TPA. Follow-up neurology services. Patient's chronic Eliquis initially held to avoid hemorrhagic transformation initially placed on aspirin and Eliquis later resumed 11/01/2017 and aspirin at that time discontinued. She was on mechanical soft diet. Patient was admitted to inpatient rehab services 11/01/2017. Patient progressing slowly with therapies. Developeddiffuse abdominal discomfort worse on left lower quadrant 11/08/2017. KUB completed concerning for possible ileus. Her diet was changed to full liquids. Noted significant hypokalemia 2.7 received intravenous supplement. 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 C. difficile specimen negative. CT of the abdomen 11/09/2017 showed fluid-filled distended colon with transverse colon measuring 13 cm. No distal obstructing lesion noted. There was an area of questionable narrowing of the splenic flexure of the colon. Patient was discharged to acute care services for further evaluation 11/09/2017. Gastroenterology services consulted and advised conservative care  fleets enema and twice a day MiraLAX was ordered. Narrowing of splenic flexure most likely peristalsis and monitored. Hypokalemia improving 3.5 11/15/2017. Patient remains on Eliquis for history of CVA/PAF. Latest abdominal film shows mild  improvement. Patient with no further bouts of nausea or vomiting.Diet has been advanced to mechanical soft which was her diet prior to ileus. Therapy evaluations completed with recommendations to resume inpatient rehab services. Patient was re-admitted for completion of her comprehensive rehab program 11/15/17.  Complete NIHSS TOTAL: 13  Past Medical History      Past Medical History:  Diagnosis Date  . Anxiety   . Arthritis   . CHF (congestive heart failure) ([place])   . COPD (chronic obstructive pulmonary disease) ([place])   . Depression   . GERD (gastroesophageal reflux disease)   . Goiter   . Hyperlipidemia   . Hypertension   . Hypothyroidism   . Kidney cysts    left side  . Myocardial infarction ([place])    8 yrs ago.   . 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 Shortness of breath   . Sleep apnea    uses CPAP, 3  . Stroke ([place])   . 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    Family History  family history includes Alcoholism in her other; Asthma in her other; Cancer in her other; Diabetes in her other; Heart disease in her other; Hypercholesterolemia in her other; Hypertension in her other; Rheumatologic disease in her other; Stroke in her other; Thyroid disease in her other.  Prior Rehab/Hospitalizations:  Has the patient had major surgery during 100 days prior to admission? No, prior to the initial hospital stay at [facility]  Current Medications   Current Facility-Administered Medications:  .  acetaminophen (TYLENOL) tablet 650 mg, 650 mg, Oral, Q6H PRN, 650 mg at 11/14/17 2042 **OR** [DISCONTINUED] acetaminophen (TYLENOL) suppository 650 mg, 650 mg, Rectal, Q6H PRN, Danford, Suann Larry, MD .  ALPRAZolam Duanne Moron) tablet 1 mg, 1 mg, Oral, QHS, Danford, Suann Larry, MD, 1 mg at 11/14/17 2346 .  apixaban (ELIQUIS) tablet 5 mg, 5 mg, Oral, BID, Danford, Suann Larry, MD, 5 mg at 11/15/17 0903 .  atorvastatin (LIPITOR) tablet 80 mg, 80 mg, Oral,  Daily, Danford, Suann Larry, MD, 80 mg at 11/15/17 0903 .  bisacodyl (DULCOLAX) suppository 10 mg, 10 mg, Rectal, Daily, Thurnell Lose, MD, 10 mg at 11/15/17 1135 .  levothyroxine (SYNTHROID, LEVOTHROID) tablet 50 mcg, 50 mcg, Oral, QAC breakfast, Danford, Suann Larry, MD, 50 mcg at 11/15/17 0903 .  pantoprazole (PROTONIX) EC tablet 40 mg, 40 mg, Oral, Daily, Danford, Suann Larry, MD, 40 mg at 11/15/17 0902 .  polyethylene glycol (MIRALAX / GLYCOLAX) packet 17 g, 17 g, Oral, BID, Brahmbhatt, Parag, MD, 17 g at 11/15/17 0902 .  potassium chloride SA (K-DUR,KLOR-CON) CR tablet 40 mEq, 40 mEq, Oral, Once, Thurnell Lose, MD  Patients Current Diet:     Diet Order                  DIET SOFT Room service appropriate? Yes; Fluid consistency: Thin  Diet effective now         Diet - low sodium heart healthy               Precautions / Restrictions Precautions Precautions: Fall Precaution Comments: R hemi, R inattention Restrictions Weight Bearing Restrictions: No   Has the patient had 2 or more falls or a fall with injury  in the past year?Yes  Prior Activity Level Community (5-7x/wk): Prior to admission for acute CVA patient was fully independent at home, out daily, and driving.  She reports using a cane of wheelchair if she was out in the community depending on the distance.    Home Assistive Devices / Equipment Home Assistive Devices/Equipment: Oxygen, Hospital bed, Eyeglasses, Dentures (specify type) Home Equipment: Cane - single point, Environmental consultant - 2 wheels, Shower seat, Bedside commode, Wheelchair - manual  Prior Device Use: Indicate devices/aids used by the patient prior to current illness, exacerbation or injury? She reports using a cane or wheelchair if she was out in the community for long distances.   Prior Functional Level Prior Function Level of Independence: Independent, Independent with assistive device(s) Gait / Transfers Assistance Needed: at  CIR: required use of Stedy and w/c for transfers and mobility Comments: Previous CIR stay required the use of a Stedy   Self Care: Did the patient need help bathing, dressing, using the toilet or eating? Independent  Indoor Mobility: Did the patient need assistance with walking from room to room (with or without device)? Independent  Stairs: Did the patient need assistance with internal or external stairs (with or without device)? Independent  Functional Cognition: Did the patient need help planning regular tasks such as shopping or remembering to take medications? Independent  Current Functional Level Cognition  Overall Cognitive Status: Within Functional Limits for tasks assessed Orientation Level: Oriented X4    Extremity Assessment (includes Sensation/Coordination)  Upper Extremity Assessment: Defer to OT evaluation  Lower Extremity Assessment: RLE deficits/detail, LLE deficits/detail RLE Deficits / Details: unable to lift R LE against gravity - requires physical assist for all mobility of LE as well as hip/trunk righting LLE Deficits / Details: WFL    ADLs       Mobility  Overal bed mobility: Needs Assistance Bed Mobility: Supine to Sit Rolling: Mod assist(R <> L) Supine to sit: Mod assist, HOB elevated General bed mobility comments: assist to bring R LE to EOB and to elevate trunk into sitting; cues for sequencing     Transfers  Overall transfer level: Needs assistance Equipment used: 2 person hand held assist Transfer via Lift Equipment: Stedy Transfers: Sit to/from [employer] to Stand: Min assist, Mod assist, +2 safety/equipment General transfer comment: pt stood X2 with Stedy and X2 without; min A to power up and to gain balance with use of Stedy standing frame and mod A +2 for stand without AD with R knee blocked and R UE supported    Ambulation / Gait / Stairs / Wheelchair Mobility  Ambulation/Gait General Gait Details: deferred for patient  therapist safety    Posture / Balance Balance Overall balance assessment: Needs assistance Sitting-balance support: Feet supported, Single extremity supported Sitting balance-Leahy Scale: Fair Standing balance support: Single extremity supported, During functional activity Standing balance-Leahy Scale: Poor Standing balance comment: reliant on external support; cues needed for weight shifting and midline posturing in standing     Special needs/care consideration BiPAP/CPAP: Yes, 2L o2 or CPAP at night  CPM: No Continuous Drip IV: No Dialysis: No         Life Vest: No Oxygen: No Special Bed: No Trach Size: No Wound Vac (area): No       Skin: WDL                               Bowel mgmt: Continent, last documented BM  11/14/17 (needs to have a BM daily) Bladder mgmt: Continent but also documented use of external foley  Diabetic mgmt: No     Previous Home Environment Living Arrangements: Spouse/significant other Available Help at Discharge: Family Type of Home: [place] Name: 4th floor rehab Home Layout: One level Home Access: Stairs to enter Entrance Stairs-Rails: None Entrance Stairs-Number of Steps: 2-3 steps to enter house with rails, 2 steps down to den but pt reports she does not have to go down there, pt states husband can install L rail at home entry [employer] Shower/Tub: Multimedia programmer: Associate Professor Accessibility: Yes [place]: No Additional Comments: home environment gleaned from chart review - hopeful return to CIR at discharge  Discharge Living Setting Plans for Discharge Living Setting: Patient's home, Lives with (comment)(Spouse and son lives next door) Type of Home at Discharge: House Discharge Home Layout: One level Discharge Home Access: Stairs to enter Entrance Stairs-Rails: None Entrance Stairs-Number of Steps: 5 Discharge Bathroom Shower/Tub: Tub/shower unit, Curtain Discharge Bathroom Toilet:  Standard Discharge Bathroom Accessibility: Yes How Accessible: Accessible via walker Does the patient have any problems obtaining your medications?: No  Social/Family/Support Systems Patient Roles: Spouse, Parent, Other (Comment) Contact Information: see above  Anticipated Caregiver: Spouse, daughter, granddaughter (son lives very nearby) Equities trader Information: see above  Ability/Limitations of Caregiver: Spouse can do light assist  Caregiver Availability: 24/7 Discharge Plan Discussed with Primary Caregiver: Yes Is Caregiver In Agreement with Plan?: Yes Does Caregiver/Family have Issues with Lodging/Transportation while Pt is in Rehab?: No  Goals/Additional Needs Patient/Family Goal for Rehab: SLP: Mod I; PT/OT: Min A Expected length of stay: was 15-20 days Cultural Considerations: None Dietary Needs: Soft solids and thin liquids  Equipment Needs: TBD Pt/Family Agrees to Admission and willing to participate: Yes Program Orientation Provided & Reviewed with Pt/Caregiver Including Roles  & Responsibilities: Yes  Barriers to Discharge: Medical stability, Home environment access/layout  Decrease burden of Care through IP rehab admission: No  Possible need for SNF placement upon discharge: No  Patient Condition: Patient is known to our service from her previous admission on 11/01/17 following her CVA; however, she discharged off to acute care 11/09/17 due to medical issues.  Patient's records were reviewed today and acute MD reports medical readiness.  Discussed with Dr. Posey Pronto and plan to proceed with re-admission to resume IP Rehab program 11/15/17.    Preadmission Screen Completed By:  Gunnar Fusi, 11/15/2017 1:26 PM ______________________________________________________________________   Discussed status with Dr. Posey Pronto on 11/15/17 at 1215 and received telephone approval for admission today.  Admission Coordinator:  Gunnar Fusi, time 1215/Date 11/15/17            Cosigned by: Jamse Arn, MD at 11/15/2017 1:34 PM  Revision History

## 2017-11-16 NOTE — Evaluation (Signed)
Occupational Therapy Assessment and Plan  Patient Details  Name: Holly Flores MRN: [id number] Date of Birth: 12/09/1945  OT Diagnosis: hemiplegia affecting dominant side and muscle weakness (generalized) Rehab Potential: Rehab Potential (ACUTE ONLY): Good ELOS: 22-26 days   Today's Date: 11/16/2017 OT Individual Time: 1100-1200 OT Individual Time Calculation (min): 60 min     Problem List:  Patient Active Problem List   Diagnosis Date Noted  . Abdominal distention   . Ileus ([place])   . 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 Hemiparesis affecting right side as late effect of stroke ([place])   . Abdominal distension   . Acute kidney injury superimposed on chronic kidney disease ([place])   . 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 Acute CVA (cerebrovascular accident) ([place])   . Slow transit constipation   . CKD (chronic kidney disease), stage II   . PAF (paroxysmal atrial fibrillation) ([place])   . Dysphasia, post-stroke   . Right flaccid hemiplegia ([place])   . COPD (chronic obstructive pulmonary disease) ([place]) 10/29/2017  . Chronic diastolic CHF (congestive heart failure) ([place]) 10/29/2017  . Hypothyroidism 10/29/2017  . Essential hypertension 10/29/2017  . GERD (gastroesophageal reflux disease) 10/29/2017  . Anxiety 10/29/2017  . Taking medication for chronic disease 05/18/2017  . Central retinal vein occlusion of left eye 01/22/2015  . Degenerative disc disease, cervical 01/22/2015  . HA (headache) 12/30/2014  . CVA (cerebral vascular accident) ([place]) 12/30/2014  . CVA (cerebral infarction) 12/30/2014  . History of colonic polyps 05/14/2014  . 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 Chest pain 04/20/2013  . Dyspnea 04/20/2013  . Hyperlipidemia 04/20/2013    Past Medical History:  Past Medical History:  Diagnosis Date  . Anxiety   . Arthritis   . CHF (congestive heart failure) ([place])   . COPD (chronic obstructive pulmonary disease) ([place])   . Depression    . GERD (gastroesophageal reflux disease)   . Goiter   . Hyperlipidemia   . Hypertension   . Hypothyroidism   . Kidney cysts    left side  . Myocardial infarction ([place])    8 yrs ago.   . 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 Shortness of breath   . Sleep apnea    uses CPAP, 3  . Stroke ([place])   . Stroke ([place])    OCULAR  LEFT  EYE    LAST YR.   Past Surgical History:  Past Surgical History:  Procedure Laterality Date  . ABDOMINAL HYSTERECTOMY    . BACK SURGERY     x3,cervical and lunmbar, disc.  Marland Kitchen BREAST BIOPSY     left-non cancerous  . CARDIAC CATHETERIZATION    . CARPAL TUNNEL RELEASE Left   . CATARACT EXTRACTION W/PHACO  09/21/2011   Procedure: CATARACT EXTRACTION PHACO AND INTRAOCULAR LENS PLACEMENT (IOC);  Surgeon: Williams Che, MD;  Location: [facility];  Service: Ophthalmology;  Laterality: Left;  CDE:9.78  . CATARACT EXTRACTION W/PHACO Right 10/15/2014   Procedure: CATARACT EXTRACTION PHACO AND INTRAOCULAR LENS PLACEMENT ([place]);  Surgeon: Williams Che, MD;  Location: [facility];  Service: Ophthalmology;  Laterality: Right;  CDE:4.19  . CERVICAL SPINE SURGERY    . CESAREAN SECTION     x2  . CHOLECYSTECTOMY    . COLONOSCOPY  2006   RMR: 1. Internal hemorroids, otherwise normal rectum. 2. Pedunculated polyp at 35 cm. reomved with snare. The remainder of teh colonic mucosa appeared normal.   . COLONOSCOPY  2011   Dr. Gala Romney: multiple  ascending colon polyps and rectal polyp, adenomatous  . COLONOSCOPY N/A 05/31/2014   Procedure: COLONOSCOPY;  Surgeon: Daneil Dolin, MD;  [facility];  Service: Endoscopy;  Laterality: N/A;  130  . COLONOSCOPY WITH PROPOFOL N/A 06/24/2017   Procedure: COLONOSCOPY WITH PROPOFOL;  Surgeon: Daneil Dolin, MD;  [facility];  Service: Endoscopy;  Laterality: N/A;  2:15pm  . ESOPHAGOGASTRODUODENOSCOPY  2006   Dr. Gala Romney: Subtle Schatzkis ring, otherwise normal upper GI tract, aside from a small pyloric channell erosion, status post  dilation as described above.   Marland Kitchen FLEXIBLE SIGMOIDOSCOPY N/A 11/11/2017   Procedure: FLEXIBLE SIGMOIDOSCOPY;  Surgeon: Otis Brace, MD;  Location: [facility];  Service: Gastroenterology;  Laterality: N/A;  . KNEE SURGERY Bilateral   . POLYPECTOMY  06/24/2017   Procedure: POLYPECTOMY;  Surgeon: Daneil Dolin, MD;  [facility];  Service: Endoscopy;;  ascending  . TONSILLECTOMY    . TOTAL KNEE ARTHROPLASTY Right 05/24/2013   DR MURPHY  . TOTAL KNEE ARTHROPLASTY Right 05/24/2013   Procedure: TOTAL KNEE ARTHROPLASTY;  Surgeon: [name], MD;  Location: [facility];  Service: Orthopedics;  Laterality: Right;    Assessment & Plan Clinical Impression: Holly Flores is a 72 year old right-handed African-American female with history of diastolic congestive heart failure, PAF maintained on Eliquis since July 2019 followed by Dr. Carlyle Dolly, CKD stage II, COPD, hypertension hyperlipidemia.  Per chart review and patient, patient lives with spouse.  Independent prior to admission and driving.  One level home with 3 steps to entry.  Good support of extended family.  Husband is receiving treatment for leukemia at [facility] hospital but can assist.  [facility]ed [facility]/29/[facility] to [facility] with right sided weakness, diplopia and difficulty speaking.  Troponin negative.  MRI reviewed, showing left brain infarction.  Per CT and MRI report, acute infarction left external capsule posteriorly.  Chronic microhemorrhage left frontal white matter adjacent to the head of the caudate.  CT angiogram of head and neck with no large vessel occlusion.  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  Echocardiogram with ejection fraction of 65% no wall motion abnormalities without thrombus.  Patient did not receive TPA.  Follow-up neurology services.  Patient's chronic Eliquis initially held to avoid hemorrhagic transformation initially placed on  aspirin and Eliquis later resumed 11/01/2017 and aspirin at that time discontinued.  She was on mechanical soft diet.  Patient was admitted to inpatient rehab services 11/01/2017.  Patient progressing slowly with therapies.  Developed  diffuse abdominal discomfort worse on left lower quadrant 11/08/2017.  KUB completed concerning for possible ileus.  Her diet was changed to full liquids.  Noted significant hypokalemia 2.7 received intravenous supplement.  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  C. difficile specimen negative.  CT of the abdomen 11/09/2017 showed fluid-filled distended colon with transverse colon measuring 13 cm.  No distal obstructing lesion noted.  There was an area of questionable narrowing of the splenic flexure of the colon.  Patient was discharged to acute care services for further evaluation 11/09/2017.  Gastroenterology services consulted and advised conservative care fleets enema and twice a day MiraLAX was ordered.  Narrowing of splenic flexure most likely peristalsis and monitored.  Hypokalemia improving 3.5 11/15/2017.  Patient remains on Eliquis for history of CVA/ PAF.  Latest abdominal film shows mild improvement.  Patient with no further bouts of nausea or vomiting.  Diet has been advanced to mechanical soft which was her diet prior to ileus.  Therapy evaluations completed with recommendations to resume inpatient rehab services.  Patient was admitted for a comprehensive rehab program..  Patient transferred to CIR on 11/15/2017 .    Patient currently requires max with basic self-care skills secondary to muscle weakness, decreased cardiorespiratoy endurance, impaired timing and sequencing, decreased coordination and decreased motor planning and decreased sitting balance, decreased standing balance, decreased postural control, hemiplegia and decreased balance strategies.  Prior to hospitalization, patient could complete ADLs with independent  .  Patient will benefit from skilled intervention to decrease level of assist with basic self-care skills and increase level of independence with iADL prior to discharge home with care partner.  Anticipate patient will require intermittent supervision and minimal physical assistance and follow up home health.  OT - End of Session Activity Tolerance: Tolerates 30+ min activity with multiple rests Endurance Deficit: Yes Endurance Deficit Description: generalized deconditioning OT Assessment Rehab Potential (ACUTE ONLY): Good OT Patient demonstrates impairments in the following area(s): Balance;Endurance;Perception;Vision;Motor;Safety;Cognition;Sensory;Skin Integrity;Edema OT Basic ADL's Functional Problem(s): Eating;Grooming;Bathing;Toileting;Dressing OT Transfers Functional Problem(s): Toilet;Tub/Shower OT Additional Impairment(s): Fuctional Use of Upper Extremity OT Plan OT Intensity: Minimum of 1-2 x/day, 45 to 90 minutes OT Frequency: 5 out of 7 days OT Duration/Estimated Length of Stay: 22-26 days OT Treatment/Interventions: Balance/vestibular training;Community reintegration;Discharge planning;Disease mangement/prevention;DME/adaptive equipment instruction;Functional electrical stimulation;Functional mobility training;Neuromuscular re-education;Pain management;Patient/family education;Psychosocial support;Self Care/advanced ADL retraining;Skin care/wound managment;Splinting/orthotics;Therapeutic Activities;Therapeutic Exercise;UE/LE Strength taining/ROM;UE/LE Coordination activities;Visual/perceptual remediation/compensation;Wheelchair propulsion/positioning OT Self Feeding Anticipated Outcome(s): set up OT Basic Self-Care Anticipated Outcome(s): (S) OT Toileting Anticipated Outcome(s): (S) OT Bathroom Transfers Anticipated Outcome(s): (S) OT Recommendation Recommendations for Other Services: Therapeutic Recreation consult Therapeutic Recreation Interventions: [place] group Patient  destination: Home Follow Up Recommendations: Home health OT Equipment Recommended: 3 in 1 bedside comode   Skilled Therapeutic Intervention Pt seen for skilled OT evaluation. Pt edu re OT POC, ELOS, and rehab expectations. Pt sat EOB and initially required R UE support and min A to maintain static sitting balance and was able to progress to no UE support and SBA sitting balance. Manual facilitation provided to position R UE, as well as moderate cueing throughout session for attention to position. Pt stood in stedy to complete clothing management, requiring verbal and tactile cues for midline orientation. Pt completed squat pivot transfer to w/c with max A with cueing for UE placement and body mechanics. A half lap tray was obtained to maintain optimal positioning and keep R UE in pt's field of vision. Pt was set up to eat lunch and chair alarm belt applied.   OT Evaluation Precautions/Restrictions  Precautions Precautions: Fall Precaution Comments: R hemi, R inattention Restrictions Weight Bearing Restrictions: No General Chart Reviewed: Yes Family/Caregiver Present: No  Pain Pain Assessment Pain Scale: 0-10 Pain Score: 0-No pain Home Living/Prior Functioning Home Living Family/patient expects to be discharged to:: Private residence Living Arrangements: Spouse/significant other Available Help at Discharge: Family Type of Home: House Home Access: Stairs to enter [employer] of Steps: 2-3 steps to enter house with rails, 2 steps down to den but pt reports she does not have to go down there, pt states husband can install L rail at home entry Entrance Stairs-Rails: None Home Layout: One level Bathroom Shower/Tub: Multimedia programmer: Standard Bathroom Accessibility: Yes Additional Comments: pt owns , w/c, RW, cane  Lives With: Spouse IADL History Homemaking Responsibilities: Yes Meal Prep Responsibility: Primary Laundry Responsibility: Primary Cleaning  Responsibility: Primary Bill Paying/Finance Responsibility:  Primary Shopping Responsibility: Primary Occupation: Retired Type of Occupation: Worked at a [employer] Prior Function Level of Independence: Independent with basic ADLs, Independent with homemaking with ambulation, Independent with gait, Independent with transfers  Able to Take Stairs?: Yes Driving: Yes Vocation: On disability ADL ADL Eating: Set up Where Assessed-Eating: Wheelchair Grooming: Setup Where Assessed-Grooming: Wheelchair Upper Body Bathing: Moderate assistance, Minimal cueing Where Assessed-Upper Body Bathing: Edge of bed Lower Body Bathing: Moderate assistance, Moderate cueing Where Assessed-Lower Body Bathing: Wheelchair Upper Body Dressing: Maximal assistance, Moderate cueing Where Assessed-Upper Body Dressing: Edge of bed Lower Body Dressing: Maximal assistance Where Assessed-Lower Body Dressing: Edge of bed Toileting: Maximal assistance Where Assessed-Toileting: Bedside Commode Toilet Transfer: Dependent, Moderate verbal cueing Toilet Transfer Method: Other (comment)(stedy) Science writer: Bedside commode Tub/Shower Transfer: Unable to assess Tub/Shower Transfer Method: Unable to assess Social research officer, government: Unable to assess Health Net Method: Unable to assess Vision Baseline Vision/History: No visual deficits Patient Visual Report: Diplopia;Peripheral vision impairment Vision Assessment?: Yes Eye Alignment: Impaired (comment)(R eye slightly off center) Ocular Range of Motion: Within Functional Limits Alignment/Gaze Preference: Within Defined Limits Tracking/Visual Pursuits: Decreased smoothness of horizontal tracking;Decreased smoothness of vertical tracking Saccades: Additional head turns occurred during testing;Decreased speed of saccadic movement Convergence: Within functional limits Additional Comments: Diplopia not currently present but pt reports hx. peripheral  vision is limited bilaterally Perception  Perception: Impaired Inattention/Neglect: Does not attend to right side of body Praxis Praxis: Intact Cognition Overall Cognitive Status: Impaired/Different from baseline Arousal/Alertness: Awake/alert Orientation Level: Person;Place;Situation Person: Oriented Place: Oriented Situation: Oriented Year: 2019 Month: October Day of Week: Correct Memory: Impaired Memory Impairment: Decreased recall of new information Immediate Memory Recall: Sock;Blue;Bed Memory Recall: Sock Memory Recall Sock: Without Cue Attention: Selective Sustained Attention: Appears intact Selective Attention: Appears intact Selective Attention Impairment: Functional complex;Verbal complex Awareness: Impaired Awareness Impairment: Emergent impairment Problem Solving: Appears intact Problem Solving Impairment: Functional complex;Verbal complex Safety/Judgment: Appears intact Sensation Sensation Light Touch: Impaired Detail Central sensation comments: Reports sensation feels off, but able to localize with vision occluded R=L Hot/Cold: Appears Intact Proprioception: Impaired Detail Proprioception Impaired Details: Absent RUE Coordination Gross Motor Movements are Fluid and Coordinated: No Fine Motor Movements are Fluid and Coordinated: No Motor  Motor Motor: Hemiplegia Motor - Skilled Clinical Observations: generalized deconditioning Mobility  Transfers Sit to Stand: Maximal Assistance - Patient 25-49%  Trunk/Postural Assessment  Cervical Assessment Cervical Assessment: Within Functional Limits Thoracic Assessment Thoracic Assessment: Exceptions to WFL(rounded shoulders) Lumbar Assessment Lumbar Assessment: Exceptions to WFL(posterior pelvic tilt) Postural Control Postural Control: Deficits on evaluation Righting Reactions: delayed and inadequate  Balance See evaluation form for sitting and standing balance evaluation Extremity/Trunk Assessment RUE  Assessment RUE Assessment: Exceptions to [facility] Passive Range of Motion (PROM) Comments: WFL Active Range of Motion (AROM) Comments: flaccid General Strength Comments: flaccid RUE Body System: Neuro Brunstrum levels for arm and hand: Hand;Arm Brunstrum level for arm: Stage I Presynergy Brunstrum level for hand: Stage I Flaccidity LUE Assessment LUE Assessment: Within Functional Limits     Refer to Care Plan for Long Term Goals  Recommendations for other services: Therapeutic Recreation  Kitchen group   Discharge Criteria: Patient will be discharged from OT if patient refuses treatment 3 consecutive times without medical reason, if treatment goals not met, if there is a change in medical status, if patient makes no progress towards goals or if patient is discharged from hospital.  The above assessment, treatment plan, treatment alternatives and goals were discussed and mutually agreed upon:  by patient  Curtis Sites 11/16/2017, 12:26 PM

## 2017-11-16 NOTE — Progress Notes (Signed)
Subjective/Complaints:   Objective: Vital Signs: Blood pressure 116/67, pulse 70, temperature 98.1 F (36.7 C), temperature source Oral, resp. rate (!) 21, height '5\' 1"'  (1.549 m), weight 96.6 kg, SpO2 98 %. Dg Abd Portable 1v  Result Date: 11/14/2017 CLINICAL DATA:  Ileus.  LEFT lower quadrant pain. EXAM: PORTABLE ABDOMEN - 1 VIEW COMPARISON:  Radiograph 11/13/2017, CT 11/09/2017 FINDINGS: Persistent distension of the transverse colon to 12 cm slightly reduced from 14 cm on prior. No evidence small bowel obstruction. There is gas and stool in the rectum. Posterior lumbar fusion and cholecystectomy clips noted IMPRESSION: Persistent distention of the transverse colon without obstruction. Mild improvement. Electronically Signed   By: Suzy Bouchard M.D.   On: 11/14/2017 10:32   Results for orders placed or performed during the hospital encounter of 11/09/17 (from the past 72 hour(s))  CBC     Status: Abnormal   Collection Time: 11/14/17  4:15 AM  Result Value Ref Range   WBC 10.8 (H) 4.0 - 10.5 K/uL   RBC 3.97 3.87 - 5.11 MIL/uL   Hemoglobin 11.9 (L) 12.0 - 15.0 g/dL   HCT 37.7 36.0 - 46.0 %   MCV 95.0 78.0 - 100.0 fL   MCH 30.0 26.0 - 34.0 pg   MCHC 31.6 30.0 - 36.0 g/dL   RDW 14.2 11.5 - 15.5 %   Platelets 248 150 - 400 K/uL    Comment: Performed at [facility] [address] [place], [place] [address]  Basic metabolic panel     Status: Abnormal   Collection Time: 11/14/17  4:15 AM  Result Value Ref Range   Sodium 139 135 - 145 mmol/L   Potassium 3.4 (L) 3.5 - 5.1 mmol/L   Chloride 119 (H) 98 - 111 mmol/L   CO2 16 (L) 22 - 32 mmol/L   Glucose, Bld 92 70 - 99 mg/dL   BUN 14 8 - 23 mg/dL   Creatinine, Ser 1.20 (H) 0.44 - 1.00 mg/dL   Calcium 8.6 (L) 8.9 - 10.3 mg/dL   GFR calc non Af Amer 44 (L) >60 mL/min   GFR calc Af Amer 51 (L) >60 mL/min    Comment: (NOTE) The eGFR has been calculated using the CKD EPI equation. This calculation has not been validated in all  clinical situations. eGFR's persistently <60 mL/min signify possible Chronic Kidney Disease.    Anion gap 4 (L) 5 - 15    Comment: Performed at [facility] [address] [place], [place] [address]  Magnesium     Status: None   Collection Time: 11/14/17  4:15 AM  Result Value Ref Range   Magnesium 2.1 1.7 - 2.4 mg/dL    Comment: Performed at [facility] [address], [place], [place] [address]  Basic metabolic panel     Status: Abnormal   Collection Time: 11/15/17  4:52 AM  Result Value Ref Range   Sodium 139 135 - 145 mmol/L   Potassium 3.5 3.5 - 5.1 mmol/L   Chloride 120 (H) 98 - 111 mmol/L   CO2 16 (L) 22 - 32 mmol/L   Glucose, Bld 96 70 - 99 mg/dL   BUN 12 8 - 23 mg/dL   Creatinine, Ser 1.09 (H) 0.44 - 1.00 mg/dL   Calcium 8.7 (L) 8.9 - 10.3 mg/dL   GFR calc non Af Amer 50 (L) >60 mL/min   GFR calc Af Amer 58 (L) >60 mL/min    Comment: (NOTE) The eGFR  has been calculated using the CKD EPI equation. This calculation has not been validated in all clinical situations. eGFR's persistently <60 mL/min signify possible Chronic Kidney Disease.    Anion gap 3 (L) 5 - 15    Comment: Performed at [facility], [place] [address] [place], [place] [address]  Magnesium     Status: None   Collection Time: 11/15/17  4:52 AM  Result Value Ref Range   Magnesium 2.0 1.7 - 2.4 mg/dL    Comment: Performed at [facility] [address] [place], [place] [address]     HEENT: normal Cardio: RRR and no murmur Resp: CTA B/L and Unlabored GI: BS positive and mild distention Extremity:  No Edema Skin:   Intact Neuro: Alert/Oriented, Abnormal Motor 0/5 in RUE adn RLE 4/5 LUE adn LLE and Dysarthric Musc/Skel:  Other no pain with UE or LE Gen NAD   Assessment/Plan: 1. Functional deficits secondary to Right hemiparesis, left subcortical infarct which require 3+ hours per day of interdisciplinary therapy in a comprehensive inpatient rehab setting. Physiatrist is  providing close team supervision and 24 hour management of active medical problems listed below. Physiatrist and rehab team continue to assess barriers to discharge/monitor patient progress toward functional and medical goals. FIM:                                  Medical Problem List and Plan: 1.  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 Resume CIR PT, OT, SLP 2.  DVT Prophylaxis/Anticoagulation: Chronic Eliquis has been resumed. 3. Pain Management: Tylenol as needed 4. Mood: Xanax 1 mg nightly 5. Neuropsych: This patient is capable of making decisions on her own behalf. 6. Skin/Wound Care: Routine skin checks 7. Fluids/Electrolytes/Nutrition: Routine in and outs with follow-up chemistries, monitor fluid intake 8.  Ogilvie syndrome.  Follow-up per gastroenterology services.  Advance diet as tolerated 9.  Hypokalemia.  Follow-up chemistries 10.  Ogilvie syndrome.  Diet advanced to mechanical soft. 11.  Diastolic congestive heart failure.  Demadex 20 mg held due to hypokalemia.  Monitor for any signs of fluid overload 12.  Hypertension.  Monitor with increased mobility Vitals:   11/15/17 2015 11/16/17 0305  BP: 117/75 116/67  Pulse: 67 70  Resp: 18 (!) 21  Temp: 98.5 F (36.9 C) 98.1 F (36.7 C)  SpO2: 100% 98%  controlled 10/1 13.  PAF.  Continue Eliquis.  Cardiac rate controlled without negative chronotropic agents 14.  Hypothyroidism.  Continue Synthroid 15.  CKD stage II.  Follow-up chemistries, GFR upper 50s, stable 16.  Hyperlipidemia.  Lipitor LOS (Days) 1 A FACE TO FACE EVALUATION WAS PERFORMED  Charlett Blake 11/16/2017, 7:17 AM

## 2017-11-16 NOTE — Progress Notes (Signed)
Patient information reviewed and entered into eRehab system by Daiva Nakayama, RN, CRRN, [place] Coordinator.  Information including medical coding, functional ability and quality indicators will be reviewed and updated through discharge.     Per nursing patient was given "Data Collection Information Summary for Patients in Inpatient Rehabilitation Facilities with attached "Privacy Act [place] Records" upon admission.  Holly Flores is a readmission past a six day stay on acute for medical treatment.

## 2017-11-16 NOTE — Progress Notes (Addendum)
Inpatient [facility] Individual Statement of Services  Patient Name:  Holly Flores  Date:  11/16/2017  Welcome to the [facility].  Our goal is to provide you with an individualized program based on your diagnosis and situation, designed to meet your specific needs.  With this comprehensive rehabilitation program, you will be expected to participate in at least 3 hours of rehabilitation therapies Monday-Friday, with modified therapy programming on the weekends.  Your rehabilitation program will include the following services:  Physical Therapy (PT), Occupational Therapy (OT), Speech Therapy (ST), 24 hour per day rehabilitation nursing, Neuropsychology, Case Management (Social Worker), Rehabilitation Medicine, Nutrition Services and Pharmacy Services  Weekly team conferences will be held on Wednesdays to discuss your progress.  Your Social Worker will talk with you frequently to get your input and to update you on team discussions.  Team conferences with you and your family in attendance may also be held.  Expected length of stay:  about 3 weeks  Overall anticipated outcome:  Contact guard/supervision; minimal assistance for transfers, ambulating, standing  Depending on your progress and recovery, your program may change. Your Social Worker will coordinate services and will keep you informed of any changes. Your Social Worker's name and contact numbers are listed  below.  The following services may also be recommended but are not provided by the [facility] will be made to provide these services after discharge if needed.  Arrangements include referral to agencies that provide these services.  Your insurance has been verified to be:  Medicare and [employer] Your primary doctor is:  Dr. Octavio Graves  Pertinent information will  be shared with your doctor and your insurance company.  Social Worker:  Alfonse Alpers, LCSW  [phone] or [phone]  Information discussed with and copy given to patient by: Trey Sailors, 11/16/2017, 10:07 AM

## 2017-11-17 ENCOUNTER — Inpatient Hospital Stay (HOSPITAL_COMMUNITY): Payer: Medicare Other | Admitting: Speech Pathology

## 2017-11-17 ENCOUNTER — Encounter (HOSPITAL_COMMUNITY): Payer: Medicare Other | Admitting: Psychology

## 2017-11-17 ENCOUNTER — Inpatient Hospital Stay (HOSPITAL_COMMUNITY): Payer: Self-pay

## 2017-11-17 ENCOUNTER — Inpatient Hospital Stay (HOSPITAL_COMMUNITY): Payer: Medicare Other | Admitting: Physical Therapy

## 2017-11-17 ENCOUNTER — Inpatient Hospital Stay (HOSPITAL_COMMUNITY): Payer: Medicare Other | Admitting: Occupational Therapy

## 2017-11-17 DIAGNOSIS — F329 Major depressive disorder, single episode, unspecified: Secondary | ICD-10-CM

## 2017-11-17 NOTE — Consult Note (Signed)
Neuropsychological Consultation   Patient:   Holly Flores   DOB:   02/21/1945  MR Number:  [id number]  Location:  [facility] [id number] [place] [address] Dept: [place]: [phone]           Date of Service:   11/17/2017  Start Time:   1 PM End Time:   2 PM  Provider/Observer:  Ilean Skill, Psy.D.       Clinical Neuropsychologist       Billing Code/Service: [phone] 4 Units  Chief Complaint:    Holly Flores is a 72 year old female with history of diastolic congestive hear failure, PAF, CKD stage II, COPD, hypertension, hyperlipidemia.  Presented 10/29/2017 to [facility] with right sided weakness, diplopia and difficulty speaking.  MRI showed left brain infarction.  Acute infarction of left external capsule posteriorly.  Chronic microhemorrhage left frontal white matter adjacent to head of the caudate.  Patient has been progressing in CIR program therapies with setback after developing diffuse abdominal discomfort on 11/08/2017.  Patient has returned to [facility] program for comprehensive rehab therapies.   Reason for Service:  The patient was referred for neuropsychological consultation due to coping and adjustment issues.  She reports depressive symptoms have worsened and also experienced anxiety with acute medical issues.  Below is the HPI for the current admission.    HPI: Holly Flores is a 72 year old right-handed African-American female with history of diastolic congestive heart failure, PAF maintained on Eliquis since July 2019 followed by Dr. Carlyle Dolly, CKD stage II, COPD, hypertension hyperlipidemia.  Per chart review and patient, patient lives with spouse.  Independent prior to admission and driving.  One level home with 3 steps to entry.  Good support of extended family.  Husband is receiving treatment for leukemia at [facility] hospital but can assist.  Presented 10/29/2017 to  [facility] with right sided weakness, diplopia and difficulty speaking.  Troponin negative.  MRI reviewed, showing left brain infarction.  Per CT and MRI report, acute infarction left external capsule posteriorly.  Chronic microhemorrhage left frontal white matter adjacent to the head of the caudate.  CT angiogram of head and neck with no large vessel occlusion.  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  Echocardiogram with ejection fraction of 65% no wall motion abnormalities without thrombus.  Patient did not receive TPA.  Follow-up neurology services.  Patient's chronic Eliquis initially held to avoid hemorrhagic transformation initially placed on aspirin and Eliquis later resumed 11/01/2017 and aspirin at that time discontinued.  She was on mechanical soft diet.  Patient was admitted to inpatient rehab services 11/01/2017.  Patient progressing slowly with therapies.  Developed  diffuse abdominal discomfort worse on left lower quadrant 11/08/2017.  KUB completed concerning for possible ileus.  Her diet was changed to full liquids.  Noted significant hypokalemia 2.7 received intravenous supplement.  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  C. difficile specimen negative.  CT of the abdomen 11/09/2017 showed fluid-filled distended colon with transverse colon measuring 13 cm.  No distal obstructing lesion noted.  There was an area of questionable narrowing of the splenic flexure of the colon.  Patient was discharged to acute care services for further evaluation 11/09/2017.  Gastroenterology services consulted and advised conservative care fleets enema and twice a day MiraLAX was ordered.  Narrowing of splenic flexure  most likely peristalsis and monitored.  Hypokalemia improving 3.5 11/15/2017.  Patient remains on Eliquis for history of CVA/ PAF.  Latest abdominal film shows mild improvement.  Patient  with no further bouts of nausea or vomiting.  Diet has been advanced to mechanical soft which was her diet prior to ileus.  Therapy evaluations completed with recommendations to resume inpatient rehab services.  Patient was admitted for a comprehensive rehab program.  Current Status:  The patient reports that she has experienced increased depressive symptoms and worry.  She reports that one of her worries is about how her husband is doing and to what degree her motor deficits will remain.  The patient's expressive language has improved but there do appear to be some continued articulation issues but without prior observation the degree is hard to determine.    Behavioral Observation: Holly Flores  presents as a 72 y.o.-year-old Right African American Female who appeared her stated age. her dress was Appropriate and she was Well Groomed and her manners were Appropriate to the situation.  her participation was indicative of Appropriate and Redirectable behaviors.  There were any physical disabilities noted.  she displayed an appropriate level of cooperation and motivation.     Interactions:    Active Appropriate and Redirectable  Attention:   abnormal and attention span appeared shorter than expected for age  Memory:   abnormal; some issues with recall of information but with time and cueing she was able to retreive information  Visuo-spatial:  not examined  Speech (Volume):  low  Speech:   slurred; appeared to be some word finding issues.  Thought Process:  Coherent and Relevant  Though Content:  WNL; not suicidal and not homicidal  Orientation:   person, place and situation  Judgment:   Fair  Planning:   Fair  Affect:    Anxious and Depressed  Mood:    Anxious and Depressed  Insight:   Good  Intelligence:   normal  Medical History:   Past Medical History:  Diagnosis Date  . Anxiety   . Arthritis   . CHF (congestive heart failure) ([place])   . COPD (chronic obstructive  pulmonary disease) ([place])   . Depression   . GERD (gastroesophageal reflux disease)   . Goiter   . Hyperlipidemia   . Hypertension   . Hypothyroidism   . Kidney cysts    left side  . Myocardial infarction ([place])    8 yrs ago.   . 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 Shortness of breath   . Sleep apnea    uses CPAP, 3  . Stroke ([place])   . 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    Psychiatric History:  Patient does have past history of some depression and anxiety, but she reports acute worsening of these symptoms.    Family Med/Psych History:  Family History  Problem Relation Age of Onset  . Cancer Other   . Diabetes Other   . Heart disease Other   . Hypertension Other   . Asthma Other   . Alcoholism Other   . Thyroid disease Other   . Rheumatologic disease Other   . Stroke Other   . Hypercholesterolemia Other   . Colon cancer Neg Hx     Risk of Suicide/Violence: low Patient denies SI or HI.  Impression/DX:  Holly Flores is a 72 year old female with history of diastolic congestive hear failure, PAF, CKD stage II, COPD,  hypertension, hyperlipidemia.  Presented 10/29/2017 to [facility] with right sided weakness, diplopia and difficulty speaking.  MRI showed left brain infarction.  Acute infarction of left external capsule posteriorly.  Chronic microhemorrhage left frontal white matter adjacent to head of the caudate.  Patient has been progressing in CIR program therapies with setback after developing diffuse abdominal discomfort on 11/08/2017.  Patient has returned to [facility] program for comprehensive rehab therapies.   The patient reports that she has experienced increased depressive symptoms and worry.  She reports that one of her worries is about how her husband is doing and to what degree her motor deficits will remain.  The patient's expressive language has improved but there do appear to be some continued articulation issues but without prior observation the degree is hard to  determine.      Disposition/Plan:  Today worked on coping and adjustment issues dealing with both her acute functional status change and worries about husband.  Will see the patient again next week.  Diagnosis:    Reactive Depression and Anxiety to current medical status         Electronically Signed   _______________________ Ilean Skill, Psy.D.

## 2017-11-17 NOTE — Progress Notes (Signed)
Physical Therapy Session Note  Patient Details  Name: Holly Flores MRN: [id number] Date of Birth: Dec 20, 1945  Today's Date: 11/17/2017 PT Individual Time: 1702-1730 PT Individual Time Calculation (min): 28 min   Short Term Goals: Week 1:  PT Short Term Goal 1 (Week 1): Pt will perform bed mobility with mod assist PT Short Term Goal 2 (Week 1): pt will perform bed<>chair transfers with mod assist  PT Short Term Goal 3 (Week 1): Pt will propel w/c x 50 ft with min assist  Skilled Therapeutic Interventions/Progress Updates:   Pt received sitting in WC and agreeable to PT. Pt transported to day room in [facility].   Squat pivot transfers completed x 2 throughout treatment with with mod-max assist from PT and max cues for set up of UE and LE to improve safety of transfer.   Nustep reciprocal movement training BLE only x 5 min , level 1>4 with cues for full ROM and proper speed. Additional 5 min with BUE and BLE using R hand orthotic to stabilize the RUE.  Patient returned to room and left sitting in [facility] with call bell in reach and all needs met.        Therapy Documentation Precautions:  Precautions Precautions: Fall Precaution Comments: R hemi, R inattention Restrictions Weight Bearing Restrictions: No Vital Signs: Therapy Vitals Temp: 98.6 F (37 C) Temp Source: Oral Pulse Rate: 60 Resp: 18 BP: 116/72 Patient Position (if appropriate): Sitting Oxygen Therapy SpO2: 100 % O2 Device: Room Air Pain: denies   Therapy/Group: Individual Therapy  Lorie Phenix 11/17/2017, 5:50 PM

## 2017-11-17 NOTE — Progress Notes (Signed)
Subjective/Complaints:  RIght heel pain started during therapy , denies injury or hx gout  ROS- no CP, SOB, N/V/D Objective: Vital Signs: Blood pressure (!) 136/91, pulse 78, temperature 98.4 F (36.9 C), temperature source Oral, resp. rate 18, height 5' 1" (1.549 m), weight 97 kg, SpO2 92 %. No results found. Results for orders placed or performed during the hospital encounter of 11/15/17 (from the past 72 hour(s))  CBC WITH DIFFERENTIAL     Status: None   Collection Time: 11/16/17  6:55 AM  Result Value Ref Range   WBC 10.1 4.0 - 10.5 K/uL   RBC 4.16 3.87 - 5.11 MIL/uL   Hemoglobin 12.6 12.0 - 15.0 g/dL   HCT 39.0 36.0 - 46.0 %   MCV 93.8 78.0 - 100.0 fL   MCH 30.3 26.0 - 34.0 pg   MCHC 32.3 30.0 - 36.0 g/dL   RDW 14.3 11.5 - 15.5 %   Platelets 245 150 - 400 K/uL   Neutrophils Relative % 53 %   Neutro Abs 5.4 1.7 - 7.7 K/uL   Lymphocytes Relative 33 %   Lymphs Abs 3.4 0.7 - 4.0 K/uL   Monocytes Relative 8 %   Monocytes Absolute 0.8 0.1 - 1.0 K/uL   Eosinophils Relative 5 %   Eosinophils Absolute 0.5 0.0 - 0.7 K/uL   Basophils Relative 1 %   Basophils Absolute 0.1 0.0 - 0.1 K/uL   Immature Granulocytes 0 %   Abs Immature Granulocytes 0.0 0.0 - 0.1 K/uL    Comment: Performed at [facility], [address] [place], [place] [address]  Comprehensive metabolic panel     Status: Abnormal   Collection Time: 11/16/17  6:55 AM  Result Value Ref Range   Sodium 139 135 - 145 mmol/L   Potassium 3.7 3.5 - 5.1 mmol/L   Chloride 120 (H) 98 - 111 mmol/L   CO2 15 (L) 22 - 32 mmol/L   Glucose, Bld 97 70 - 99 mg/dL   BUN 11 8 - 23 mg/dL   Creatinine, Ser 1.13 (H) 0.44 - 1.00 mg/dL   Calcium 8.7 (L) 8.9 - 10.3 mg/dL   Total Protein 6.2 (L) 6.5 - 8.1 g/dL   Albumin 2.7 (L) 3.5 - 5.0 g/dL   AST 22 15 - 41 U/L   ALT 40 0 - 44 U/L   Alkaline Phosphatase 86 38 - 126 U/L   Total Bilirubin 0.3 0.3 - 1.2 mg/dL   GFR calc non Af Amer 48 (L) >60 mL/min   GFR calc Af Amer 55 (L) >60  mL/min    Comment: (NOTE) The eGFR has been calculated using the CKD EPI equation. This calculation has not been validated in all clinical situations. eGFR's persistently <60 mL/min signify possible Chronic Kidney Disease.    Anion gap 4 (L) 5 - 15    Comment: Performed at [facility], [address] [place], [place] [address]     HEENT: normal Cardio: RRR and no murmur Resp: CTA B/L and Unlabored GI: BS positive and mild distention Extremity:  No Edema Skin:   Intact Neuro: Alert/Oriented, Abnormal Motor 0/5 in RUE adn RLE 4/5 LUE adn LLE and Dysarthric Musc/Skel:  Other no pain with UE or LE Gen NAD   Assessment/Plan: 1. Functional deficits secondary to Right hemiparesis, left subcortical infarct which require 3+ hours per day of interdisciplinary therapy in a comprehensive inpatient rehab setting. Physiatrist is providing close team supervision and 24 hour management of active   medical problems listed below. Physiatrist and rehab team continue to assess barriers to discharge/monitor patient progress toward functional and medical goals. FIM:                                  Medical Problem List and Plan: 1.  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 Team conference today please see physician documentation under team conference tab, met with team face-to-face to discuss problems,progress, and goals. Formulized individual treatment plan based on medical history, underlying problem and comorbidities. 2.  DVT Prophylaxis/Anticoagulation: Chronic Eliquis has been resumed. 3. Pain Management: Tylenol as needed 4. Mood: Xanax 1 mg nightly 5. Neuropsych: This patient is capable of making decisions on her own behalf. 6. Skin/Wound Care:  Routine skin checks 7. Fluids/Electrolytes/Nutrition: Routine in and outs with follow-up chemistries, low alb start prostat 8.  Ogilvie syndrome.  Follow-up per gastroenterology services.  Advance diet as tolerated 9.  Hypokalemia.  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  Diastolic congestive heart failure.  Demadex 20 mg held due to hypokalemia.  Monitor for any signs of fluid overload Mild pre tib edema R>L no need to restart torsemide yet 12.  Hypertension.  Monitor with increased mobility Vitals:   11/16/17 1935 11/17/17 0422  BP: 126/62 (!) 136/91  Pulse: 68 78  Resp: 18 18  Temp: 98 F (36.7 C) 98.4 F (36.9 C)  SpO2: 100% 92%  controlled 10/2 13.  PAF.  Continue Eliquis.  Cardiac rate controlled without negative chronotropic agents 14.  Hypothyroidism.  Continue Synthroid 15.  CKD stage II.  Follow-up chemistries, GFR upper 50s, stable 16.  Hyperlipidemia.  Lipitor LOS (Days) 2 A FACE TO FACE EVALUATION WAS PERFORMED  Charlett Blake 11/17/2017, 7:40 AM

## 2017-11-17 NOTE — Progress Notes (Signed)
Speech Language Pathology Daily Session Note  Patient Details  Name: Holly Flores MRN: [id number] Date of Birth: 06-Sep-1945  Today's Date: 11/17/2017 SLP Individual Time: 0730-0800 SLP Individual Time Calculation (min): 30 min  Short Term Goals: Week 1: SLP Short Term Goal 1 (Week 1): Pt will consume regular texture trials without overt s/s aspiration with Mod I for use of swallowing compensatory strategies over 2 sessions prior to upgrade.  SLP Short Term Goal 2 (Week 1): Patient will utilize speech intelligibility strategies at the phrase level with supervision verbal cues.  SLP Short Term Goal 3 (Week 1): Patient will utilize diaphragmatic breathing at the phrase level with Min A verbal cues.   Skilled Therapeutic Interventions: Skilled treatment session focused on speech and dysphagia goals. SLP facilitated session by providing Mod-Max A verbal cues for use of diaphragmatic breathing during sustained phonation tasks. Patient also demonstrated difficulty exhaling air in a controlled manner and appeared to be holding her breath and making grunting sounds during exhalation despite Max A multimodal cues. Patient also consumed minimal trials of regular textures (with permission from physician) with mildly prolonged mastication due to removing her dentures. Patient reports she removes her dentures at home during meals and that mildly prolonged mastication is baseline. Recommend trial tray prior to upgrade. Patient left upright in bed with alarm on and all needs within reach. Continue with current plan of care.      Pain No/Denies Pain  Therapy/Group: Individual Therapy  Surya Schroeter 11/17/2017, 8:59 AM

## 2017-11-17 NOTE — Progress Notes (Signed)
Occupational Therapy Session Note  Patient Details  Name: Holly Flores MRN: [id number] Date of Birth: 05/04/1945  Today's Date: 11/17/2017 OT Individual Time: 6948-5462 OT Individual Time Calculation (min): 75 min    Short Term Goals: Week 1:  OT Short Term Goal 1 (Week 1): Pt will don shirt with mod A sitting EOB OT Short Term Goal 2 (Week 1): Pt will complete sit to stand with mod A OT Short Term Goal 3 (Week 1): Pt will complete toilet transfer with max assist of one caregiver  Skilled Therapeutic Interventions/Progress Updates:    Pt received in bed and stated her R heel is bothering her.  She felt a sponge bath would be better than a shower today.   PROM to RUE with good scapular range but no active movement in arm. Education on self ROM.  Pt worked on bed mobility skills of scooting hips to center of bed, rolling using LUE to support RUE with knees bent. Pt needs full A to stabilize R leg in flexion.  Pt needed brief doffed and to be cleansed.  Pt was able to wash front perineal area with HOB elevated and therapist worked on buttocks with pt rolling with mod A.  Rolled to L side and pushed up to sit with mod-max A.  Pt maintained sit balance at EOB with close S.  Worked on forward leaning with shifting weight forward for small sit to partial squat in preparation for squat pivot to w/c.  Pt then completed transfer to L side with max A.   For LB bathing and dressing pt needs max A as her R leg can not lift and she can not reach forward to her feet.  Pt uses a reacher at home.  Pt worked on sit to stand with mod-max A pushing up with L hand and then max A to maintain static standing. Her husband A with pulling pt's pants up over hips as pt was using L hand on sink to help stabilize balance. Max A with pullover shirt due to flaccid RUE and limited L shoulder ROM. Husband reports pt used button up shirts at home.  p Pt set up in w/c with lap tray, chair alarm belt, call light.  All needs met.     Therapy Documentation Precautions:  Precautions Precautions: Fall Precaution Comments: R hemi, R inattention Restrictions Weight Bearing Restrictions: No  Pain: Pain Assessment Pain Scale: 0-10 Pain Score: 4  Pain Type: Acute pain Pain Location: Leg Pain Orientation: Right;Upper Pain Descriptors / Indicators: Aching Pain Frequency: Intermittent Pain Onset: Gradual Patients Stated Pain Goal: 0 Pain Intervention(s): Medication (See eMAR);Repositioned ADL: ADL Eating: Set up Where Assessed-Eating: Wheelchair Grooming: Setup Where Assessed-Grooming: Wheelchair Upper Body Bathing: Moderate assistance, Minimal cueing Where Assessed-Upper Body Bathing: Edge of bed Lower Body Bathing: Moderate assistance, Moderate cueing Where Assessed-Lower Body Bathing: Wheelchair Upper Body Dressing: Maximal assistance, Moderate cueing Where Assessed-Upper Body Dressing: Edge of bed Lower Body Dressing: Maximal assistance Where Assessed-Lower Body Dressing: Edge of bed Toileting: Maximal assistance Where Assessed-Toileting: Bedside Commode Toilet Transfer: Dependent, Moderate verbal cueing Toilet Transfer Method: Other (comment)(stedy) Science writer: Bedside commode Tub/Shower Transfer: Unable to assess Tub/Shower Transfer Method: Unable to assess [employer] Transfer: Unable to assess [employer] Method: Unable to assess    Therapy/Group: Individual Therapy  Leeah Politano 11/17/2017, 9:57 AM

## 2017-11-17 NOTE — Progress Notes (Signed)
RT offered pt CPAP for the night. Pt stated she does not use one at home and that she is breathing just fine without it. RT assessed pt; Bilat BS clear/diminished, SpO2 99% on RA. RT explained to the pt if she changes her mind that we will be happy to bring her a machine and help her put it on. RT will continue to monitor.

## 2017-11-17 NOTE — Progress Notes (Signed)
Physical Therapy Session Note  Patient Details  Name: Holly Flores MRN: [id number] Date of Birth: 1945-12-25  Today's Date: 11/17/2017 PT Individual Time: 1500-1530 PT Individual Time Calculation (min): 30 min   Short Term Goals: Week 1:  PT Short Term Goal 1 (Week 1): Pt will perform bed mobility with mod assist PT Short Term Goal 2 (Week 1): pt will perform bed<>chair transfers with mod assist  PT Short Term Goal 3 (Week 1): Pt will propel w/c x 50 ft with min assist  Skilled Therapeutic Interventions/Progress Updates: Pt presented in w/c agreeable to therapy. Pt transported to day room for time management. Participated in Cybex Kinetron 80cm/sec 2 sets of 20 cycles for R NMR/reciprocal activity. Pt transported to rehab gym and performed sit to stand in parallel bars x 3 with PTA blocking R knee and performed lateral wt shifts to increase R wt bearing. PTA also providing manual facilitation of TKE with wt shifted to R. Multimodal cues for anterior translation of hips to improve posture. Pt returned to room and remained in w/c at end of session with call bell within reach and needs met.      Therapy Documentation Precautions:  Precautions Precautions: Fall Precaution Comments: R hemi, R inattention Restrictions Weight Bearing Restrictions: No General:   Vital Signs: Therapy Vitals Temp: 98.6 F (37 C) Temp Source: Oral Pulse Rate: 60 Resp: 18 BP: 116/72 Patient Position (if appropriate): Sitting Oxygen Therapy SpO2: 100 % O2 Device: Room Air   Therapy/Group: Individual Therapy  [name]  Marquell Saenz, PTA  11/17/2017, 3:43 PM

## 2017-11-17 NOTE — Progress Notes (Signed)
Physical Therapy Session Note  Patient Details  Name: Holly Flores MRN: [id number] Date of Birth: 17-Jan-1946  Today's Date: 11/17/2017 PT Individual Time: 1130-1200 PT Individual Time Calculation (min): 30 min   Short Term Goals: Week 1:  PT Short Term Goal 1 (Week 1): Pt will perform bed mobility with mod assist PT Short Term Goal 2 (Week 1): pt will perform bed<>chair transfers with mod assist  PT Short Term Goal 3 (Week 1): Pt will propel w/c x 50 ft with min assist  Skilled Therapeutic Interventions/Progress Updates:    Pt seated in w/c upon PT arrival, agreeable to therapy tx and denies pain. Pt transported to the gym. Pt performed squat pivot transfer from w/c>mat with max assist to the R. Pt performed sit<>stands from the mat x3, x 4 with therapist in front of pt and blocking the R knee, emphasis on symmetric weightbearing. Pt performed squat pivot back to w/c to the L with max assist, manual facilitation for weightshifting. Pt transported back to room and left seated with needs in reach.   Therapy Documentation Precautions:  Precautions Precautions: Fall Precaution Comments: R hemi, R inattention Restrictions Weight Bearing Restrictions: No    Therapy/Group: Individual Therapy  Netta Corrigan, PT, DPT 11/17/2017, 7:53 AM

## 2017-11-18 ENCOUNTER — Inpatient Hospital Stay (HOSPITAL_COMMUNITY): Payer: Medicare Other | Admitting: Speech Pathology

## 2017-11-18 ENCOUNTER — Inpatient Hospital Stay (HOSPITAL_COMMUNITY): Payer: Medicare Other | Admitting: Physical Therapy

## 2017-11-18 ENCOUNTER — Inpatient Hospital Stay (HOSPITAL_COMMUNITY): Payer: Self-pay | Admitting: Physical Therapy

## 2017-11-18 ENCOUNTER — Inpatient Hospital Stay (HOSPITAL_COMMUNITY): Payer: Medicare Other | Admitting: Occupational Therapy

## 2017-11-18 MED ORDER — TOPIRAMATE 25 MG PO TABS
25.0000 mg | ORAL_TABLET | Freq: Two times a day (BID) | ORAL | Status: DC
  Administered 2017-11-18 – 2017-12-08 (×41): 25 mg via ORAL
  Filled 2017-11-18 (×41): qty 1

## 2017-11-18 NOTE — Progress Notes (Signed)
Social Work Patient ID: Holly Flores, female   DOB: Feb 27, 1945, 72 y.o.   MRN: [id number]   CSW met with pt and her dtr to update them on team conference discussion and targeted d/c date of 12-08-17.  Pt really feels pushed to be home with her husband, but understands the importance of her getting stronger prior to going home to help him.  Pt/dtr asked about estim for her arm as OT was getting ready to do this prior to pt's transfer back to acute.  CSW spoke with OT, Meriel Pica, and she will work with pt on this or pass it to the next OT.  CSW will continue to follow pt and assist as needed.

## 2017-11-18 NOTE — Progress Notes (Signed)
Speech Language Pathology Daily Session Note  Patient Details  Name: Holly Flores MRN: [id number] Date of Birth: 09-26-1945  Today's Date: 11/18/2017 SLP Individual Time: 1130-1200 SLP Individual Time Calculation (min): 30 min  Short Term Goals: Week 1: SLP Short Term Goal 1 (Week 1): Pt will consume regular texture trials without overt s/s aspiration with Mod I for use of swallowing compensatory strategies over 2 sessions prior to upgrade.  SLP Short Term Goal 2 (Week 1): Patient will utilize speech intelligibility strategies at the phrase level with supervision verbal cues.  SLP Short Term Goal 3 (Week 1): Patient will utilize diaphragmatic breathing at the phrase level with Min A verbal cues.   Skilled Therapeutic Interventions: Skilled treatment session focused on dysphagia and speech goals. SLP facilitated session by providing skilled observation and set-up assist with lunch trial tray of Dys. 3 textures. Patient demonstrated efficient mastication with complete oral clearance without overt s/s of aspiration. Therefore, recommend patient upgrade to Dys. 3 textures. Patient with increased social engagement and verbalizations this session and was ~75% intelligible at the sentence level with Min A verbal cues needed for use of speech intelligibility strategies. Patient left upright in wheelchair with all needs within reach and alarm on. Continue with current plan of care.       Pain Pain Assessment Pain Score: 0-No pain  Therapy/Group: Individual Therapy  Walton Digilio 11/18/2017, 3:41 PM

## 2017-11-18 NOTE — Patient Care Conference (Signed)
Inpatient RehabilitationTeam Conference and Plan of Care Update Date: 11/17/2017   Time: 11:00 AM    Patient Name: Holly Flores      Medical Record Number: [id number]  Date of Birth: [birth year] Sex: Female         Room/Bed: 4W09C/4W09C-01 Payor Info: Payor: MEDICARE / Plan: MEDICARE PART A AND B / Product Type: *No Product type* /    Admitting Diagnosis: CVA  Admit Date/Time:  11/15/2017  5:26 PM Admission Comments: No comment available   Primary Diagnosis:  <principal problem not specified> Principal Problem: <principal problem not specified>  Patient Active Problem List   Diagnosis Date Noted  . Reactive depression   . Abdominal distention   . Ileus ([place])   . 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 Hemiparesis affecting right side as late effect of stroke ([place])   . Abdominal distension   . Acute kidney injury superimposed on chronic kidney disease ([place])   . Left middle cerebral artery stroke ([place]) 11/01/2017  . Acute CVA (cerebrovascular accident) ([place])   . Slow transit constipation   . CKD (chronic kidney disease), stage II   . PAF (paroxysmal atrial fibrillation) ([place])   . Dysphasia, post-stroke   . Right flaccid hemiplegia ([place])   . COPD (chronic obstructive pulmonary disease) ([place]) 10/29/2017  . Chronic diastolic CHF (congestive heart failure) ([place]) 10/29/2017  . Hypothyroidism 10/29/2017  . Essential hypertension 10/29/2017  . GERD (gastroesophageal reflux disease) 10/29/2017  . Anxiety 10/29/2017  . Taking medication for chronic disease 05/18/2017  . Central retinal vein occlusion of left eye 01/22/2015  . Degenerative disc disease, cervical 01/22/2015  . HA (headache) 12/30/2014  . CVA (cerebral vascular accident) ([place]) 12/30/2014  . CVA (cerebral infarction) 12/30/2014  . History of colonic polyps 05/14/2014  . 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 Chest pain 04/20/2013  . Dyspnea 04/20/2013  . Hyperlipidemia 04/20/2013    Expected Discharge  Date: Expected Discharge Date: 12/08/17  Team Members Present: Physician leading conference: Dr. Alysia Penna Social Worker Present: Alfonse Alpers, LCSW Nurse Present: Rayetta Pigg, RN PT Present: Michaelene Song, PT OT Present: Willeen Cass, OT SLP Present: Weston Anna, SLP PPS Coordinator present : Daiva Nakayama, RN, CRRN     Current Status/Progress Goal Weekly Team Focus  Medical   Still requiring IV fluids, right hemiparesis with some increased movement and standing tolerance  Maintain medical stability, adequate fluid and caloric intake  Continue rehab program, advance diet   Bowel/Bladder   Continent of B/B LBM 10/01 loose stools today  remain continent with normal bowel pattern resolution of loose stools  toilet schedule bulking agents prn   Swallow/Nutrition/ Hydration   Dys. 3 textures with thin liquids, Mod I  Mod I with least restrictive diet  trials of regular textures    ADL's   max A transfers, mod A bathing, max A UB dressing, max A to total A LB dressing, total A toileting with use of STEDY, RUE flaccid  contact guard A overall with transfers, ADLs  RUE NMR with EStim, ADL training, functional mobility, NDT, transfers, pt/family education   Mobility   max assist bed mobility, max assist transfers, max assist sit<>stand and dynamic standing balance, mod assist w/c propulsion  min assist transfers, supervision w/c mobility, mod assist gait goal with PT only  R NMR, standing balance, transfers, R attention, strength, bed mobility   Communication   Mod A  Mod I   use of speech intelligibility strategies and diaphragmatic breathing,  RMT?    Safety/Cognition/ Behavioral Observations  Appears at baseline          Pain   Pt denies any pain   Pt will be free of pain  assess pain qshift and prn   Skin   Skin intact  skin will remain intact  assess skin qshift    Rehab Goals Patient on target to meet rehab goals: Yes Rehab Goals Revised: none *See Care Plan  and progress notes for long and short-term goals.     Barriers to Discharge  Current Status/Progress Possible Resolutions Date Resolved   Physician    Medical stability     Ileus resolved  See above continue rehab program      Nursing                  PT  Inaccessible home environment;Decreased caregiver support;Home environment access/layout  steps to enter, unsure if pt will have 24/7 care              OT                  SLP                SW                Discharge Planning/Teaching Needs:  Pt will return to her home where her dtr and other family members will assist with pt's care.  Husband is dealing with his own medical issues presently.  Family education closer to d/c.   Team Discussion:  Pt is back from acute hospitalization due to ileus, but that is resolving - still with mild swelling.  Pt's potassium level is better.  Pt still with watery stools, but Dr. Letta Pate said this is normal healing for ileus.  Pt is max A for tx sit to stand and PT cannot practice gait right now due to right leg giving out.  Pt is mod A w/c level, with min A goals.  Pt is close to her cognitive baseline, but ST will work on breath support and speech intelligibility.  Pt is on soft diet, but ST to try regular textures; takes dentures out to eat.  Revisions to Treatment Plan:  none    Continued Need for Acute Rehabilitation Level of Care: The patient requires daily medical management by a physician with specialized training in physical medicine and rehabilitation for the following conditions: Daily analysis of laboratory values and/or radiology reports with any subsequent need for medication adjustment of medical intervention for : Neurological problems   I attest that I was present, lead the team conference, and concur with the assessment and plan of the team.   [name], Silvestre Mesi 11/18/2017, 1:18 PM

## 2017-11-18 NOTE — Progress Notes (Signed)
RT offered pt CPAP again tonight and she declined. Pt has order that if she refuses CPAP that she can go on 2L [place] for the night. This RT placed pt on [facility] for the night, pts sat before placement 99% after placement of O2 99%. Pt states that she wears 2L[place] at home. RT will continue to monitor.

## 2017-11-18 NOTE — IPOC Note (Signed)
Overall Plan of Care [facility]) Patient Details Name: Holly Flores MRN: [id number] DOB: January 15, 1946  Admitting Diagnosis: <principal problem not specified>  Hospital Problems: Active Problems:   CVA (cerebral vascular accident) ([place])   Reactive depression     Functional Problem List: Nursing Bladder, Bowel, Medication Management, Motor, Nutrition, Pain, Safety, Sensory  PT Balance, Sensory, Behavior, Skin Integrity, Edema, Endurance, Motor, Nutrition, Pain, Perception, Safety  OT Balance, Endurance, Perception, Vision, Motor, Safety, Cognition, Sensory, Skin Integrity, Edema  SLP    TR         Basic ADL's: OT Eating, Grooming, Bathing, Toileting, Dressing     Advanced  ADL's: OT       Transfers: PT Bed Mobility, Car, Furniture, Bed to Education administrator, Metallurgist: PT Ambulation, Emergency planning/management officer, Stairs     Additional Impairments: OT Fuctional Use of Upper Extremity  SLP Communication, Swallowing expression    TR      Anticipated Outcomes Item Anticipated Outcome  Self Feeding set up  Swallowing  Mod I    Basic self-care  (S)  Toileting  (S)   Bathroom Transfers (S)  Bowel/Bladder  manage bowel and bladder with mod assist with bowel movement daily.   Transfers  min assist  Locomotion  supervision w/c level  Communication  Mod I  Cognition     Pain  less than 2   Safety/Judgment  min assist    Therapy Plan: PT Intensity: Minimum of 1-2 x/day ,45 to 90 minutes PT Frequency: 5 out of 7 days PT Duration Estimated Length of Stay: 3 weeks OT Intensity: Minimum of 1-2 x/day, 45 to 90 minutes OT Frequency: 5 out of 7 days OT Duration/Estimated Length of Stay: 22-26 days SLP Intensity: Minumum of 1-2 x/day, 30 to 90 minutes SLP Frequency: 3 to 5 out of 7 days SLP Duration/Estimated Length of Stay: 22-26 days     Team Interventions: Nursing Interventions Patient/Family Education, Bladder Management, Bowel Management, Medication  Management, Pain Management, Disease Management/Prevention, Dysphagia/Aspiration Precaution Training, Discharge Planning, Psychosocial Support  PT interventions Cognitive remediation/compensation, Discharge planning, Ambulation/gait training, DME/adaptive equipment instruction, Functional mobility training, Pain management, Psychosocial support, Splinting/orthotics, Therapeutic Activities, UE/LE Strength taining/ROM, Visual/perceptual remediation/compensation, Wheelchair propulsion/positioning, UE/LE Coordination activities, Therapeutic Exercise, Stair training, Skin care/wound management, Patient/family education, Neuromuscular re-education, Functional electrical stimulation, Disease management/prevention, Academic librarian, Training and development officer  OT Interventions Training and development officer, Academic librarian, Discharge planning, Disease mangement/prevention, Engineer, drilling, Functional electrical stimulation, Functional mobility training, Neuromuscular re-education, Pain management, Patient/family education, Psychosocial support, Self Care/advanced ADL retraining, Skin care/wound managment, Splinting/orthotics, Therapeutic Activities, Therapeutic Exercise, UE/LE Strength taining/ROM, UE/LE Coordination activities, Visual/perceptual remediation/compensation, Wheelchair propulsion/positioning  SLP Interventions Cueing hierarchy, Functional tasks, Speech/Language facilitation, Dysphagia/aspiration precaution training  TR Interventions    SW/CM Interventions     Barriers to Discharge MD  Medical stability and cognition  Nursing      PT Inaccessible home environment, Decreased caregiver support, Home environment access/layout steps to enter, unsure if pt will have 24/7 care  OT      SLP      SW       Team Discharge Planning: Destination: PT-Home ,OT- Home , SLP-Home Projected Follow-up: PT-Home health PT, OT-  Home health OT, SLP-None, 24 hour  supervision/assistance Projected Equipment Needs: PT-To be determined, Wheelchair (measurements), OT- 3 in 1 bedside comode, SLP-None recommended by SLP Equipment Details: PT-20x18 w/c, OT-  Patient/family involved in discharge planning: PT- Patient,  OT-Patient, SLP-Patient  MD ELOS: 15-19d Medical Rehab  Prognosis:  Good Assessment:  72 year old right-handed African-American female with history of diastolic congestive heart failure, PAF maintained on Eliquis since July 2019 followed by Dr. Carlyle Flores, CKD stage II, COPD, hypertension hyperlipidemia.  Per chart review and patient, patient lives with spouse.  Independent prior to admission and driving.  One level home with 3 steps to entry.  Good support of extended family.  Husband is receiving treatment for leukemia at [facility] hospital but can assist.  Presented 10/29/2017 to [facility] with right sided weakness, diplopia and difficulty speaking.  Troponin negative.  MRI reviewed, showing left brain infarction.  Per CT and MRI report, acute infarction left external capsule posteriorly.  Chronic microhemorrhage left frontal white matter adjacent to the head of the caudate.  CT angiogram of head and neck with no large vessel occlusion.  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  Echocardiogram with ejection fraction of 65% no wall motion abnormalities without thrombus.  Patient did not receive TPA.  Follow-up neurology services.  Patient's chronic Eliquis initially held to avoid hemorrhagic transformation initially placed on aspirin and Eliquis later resumed 11/01/2017 and aspirin at that time discontinued.  She was on mechanical soft diet.  Patient was admitted to inpatient rehab services 11/01/2017.  Patient progressing slowly with therapies.  Developed  diffuse abdominal discomfort worse on left lower quadrant 11/08/2017.  KUB completed concerning for possible ileus.  Her diet was  changed to full liquids.  Noted significant hypokalemia 2.7 received intravenous supplement.  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  C. difficile specimen negative.  CT of the abdomen 11/09/2017 showed fluid-filled distended colon with transverse colon measuring 13 cm.  No distal obstructing lesion noted.  There was an area of questionable narrowing of the splenic flexure of the colon.  Patient was discharged to acute care services for further evaluation 11/09/2017.  Gastroenterology services consulted and advised conservative care fleets enema and twice a day MiraLAX was ordered.  Narrowing of splenic flexure most likely peristalsis and monitored.  Hypokalemia improving 3.5 11/15/2017.  Patient remains on Eliquis for history of CVA/ PAF.  Latest abdominal film shows mild improvement.  Patient with no further bouts of nausea or vomiting.     Now requiring 24/7 Rehab RN,MD, as well as CIR level PT, OT and SLP.  Treatment team will focus on ADLs and mobility with goals set at [facility] See Team Conference Notes for weekly updates to the plan of care

## 2017-11-18 NOTE — Progress Notes (Signed)
Physical Therapy Session Note  Patient Details  Name: Holly Flores MRN: [id number] Date of Birth: 27-Aug-1945  Today's Date: 11/18/2017 PT Individual Time: 0800-0830 AND 1300-1409 PT Individual Time Calculation (min): 30 min AND 69 min  Short Term Goals: Week 1:  PT Short Term Goal 1 (Week 1): Pt will perform bed mobility with mod assist PT Short Term Goal 2 (Week 1): pt will perform bed<>chair transfers with mod assist  PT Short Term Goal 3 (Week 1): Pt will propel w/c x 50 ft with min assist  Skilled Therapeutic Interventions/Progress Updates:   Session 1:  Pt in supine and agreeable to therapy, 5/10 headache pain, RN made aware. Pt noticed to be breathing heavily while in supine, vitals 96% O2, 85 bpm and BP 132/82. However she denied any distress. Total assist to don TEDs and socks. Transferred to EOB, max assist and to w/c via squat pivot mod-max assist. O2 and HR remained WNL after transfer, BP 125/108 however back to WNL after taking it immediately again. Total assist w/c transport to/from therapy gym. Worked on LE NMR on kinetron w/ emphasis on RLE muscle activation. Tactile cues for quad engagement. 50 cm/sec 2 min x3 reps. Returned to room and pt agreeable to stay in w/c until next session, all needs in reach. RN made aware of increased work of breathing.   Session 2:  Pt in w/c and agreeable to therapy, no c/o pain. Pt appeared less fatigued this afternoon, no SOB compared to this morning. Total assist w/c transport to/from therapy gym. Squat pivot w/c<>mat w/ max assist. Max assist for RLE management and to block R knee, pt able to take small pivot step when reaching to armrest and total assist to bring RLE backwards towards chair. Therapist lowered w/c in between transfers. Worked on sit<>stands to [employer] w/ mod assist to stand x8 reps, total assist to block R knee. Performed lateral weight shifting and TKEs in stance to work on R quad activation. Transitioned to supine to  work on hip and knee musculature in gravity eliminated position. Unable to palpate any muscle activation. Returned to w/c and performed max assist squat pivot to/from NuStep, although improved from first transfer to mat. Performed NuStep BLEs and LUE, for NMR and endurance training 3 min x3 reps, tactile cues for R quad activation and manual assist for neutral RLE alignment. Returned to room via w/c. Ended session in supine, call bell in reach and all needs met.   Therapy Documentation Precautions:  Precautions Precautions: Fall Precaution Comments: R hemi, R inattention Restrictions Weight Bearing Restrictions: No Pain: Pain Assessment Faces Pain Scale: Hurts a little bit  Therapy/Group: Individual Therapy  Gilbert Narain K Shanique Aslinger 11/18/2017, 10:00 AM

## 2017-11-18 NOTE — Progress Notes (Signed)
Occupational Therapy Session Note  Patient Details  Name: Holly Flores MRN: [id number] Date of Birth: 09/01/1945  Today's Date: 11/18/2017 OT Individual Time: 0930-1030 OT Individual Time Calculation (min): 60 min    Short Term Goals: Week 1:  OT Short Term Goal 1 (Week 1): Pt will don shirt with mod A sitting EOB OT Short Term Goal 2 (Week 1): Pt will complete sit to stand with mod A OT Short Term Goal 3 (Week 1): Pt will complete toilet transfer with max assist of one caregiver  Skilled Therapeutic Interventions/Progress Updates:    Pt received in w/c in room with clothing on.  Pt eager to work on her RUE.  Pt did not want to get undressed, bathed and redressed this session.  Pt taken to gym and vitals assessed.  Blood pressure 131/76, HR 71.  Pt worked on forward weight shift and lean with squat pivot to mat.  On mat, upright posture and balance. Pt was able to maintain posture and balance well throughout the session. Pt eager to try estim for RUE. Explained how estim is used in CVA recovery so pt would have clear expectations.   Estim set on small muscle atrophy setting at intensity 35 for 10 min for wrist and finger extension.  Pt focused on her hand and imagined she was opening her fingers.  Then pads moved to finger flexion at intensity 35 for 10 min with pt visualizing her pinching her fingers together.  Pt tolerated estim well. RUE PROM with large GM movement patterns with table top towel slides.   Pt worked on transfer skills back to w/c.  She continues to need max A with transfers due to flaccid R side and difficulty fully clearing her hips.   Pt taken back to her room with belt alarm on and all needs met.  Call light in reach.  Therapy Documentation Precautions:  Precautions Precautions: Fall Precaution Comments: R hemi, R inattention Restrictions Weight Bearing Restrictions: No   Pain: Pain Assessment Pain Scale: 0-10 Pain Score: 0-No pain Faces Pain Scale: Hurts a  little bit Pain Type: Acute pain Pain Location: Head Pain Orientation: Anterior Pain Descriptors / Indicators: Headache;Aching Pain Onset: On-going Pain Intervention(s): Medication (See eMAR)(tylenol given at 0610) ADL: ADL Eating: Set up Where Assessed-Eating: Wheelchair Grooming: Setup Where Assessed-Grooming: Wheelchair Upper Body Bathing: Moderate assistance, Minimal cueing Where Assessed-Upper Body Bathing: Edge of bed Lower Body Bathing: Moderate assistance, Moderate cueing Where Assessed-Lower Body Bathing: Wheelchair Upper Body Dressing: Maximal assistance, Moderate cueing Where Assessed-Upper Body Dressing: Edge of bed Lower Body Dressing: Maximal assistance Where Assessed-Lower Body Dressing: Edge of bed Toileting: Maximal assistance Where Assessed-Toileting: Bedside Commode Toilet Transfer: Dependent, Moderate verbal cueing Toilet Transfer Method: Other (comment)(stedy) Science writer: Bedside commode Tub/Shower Transfer: Unable to assess Tub/Shower Transfer Method: Unable to assess [employer] Transfer: Unable to assess [employer] Method: Unable to assess   Therapy/Group: Individual Therapy  [place] 11/18/2017, 12:16 PM

## 2017-11-18 NOTE — Progress Notes (Signed)
Patient had an IV site in her left antecubital space & right forearm. Right forearm was removed due to air being in the line.Catheter was intact, no bleeding noted. Patient tolerated it well

## 2017-11-18 NOTE — Progress Notes (Signed)
Subjective/Complaints:  Pt c/o HA, has been treated with topamax by PCP for migraines , current frontal HA feels same as usual , partial response to acetaminophen  ROS- no CP, SOB, N/V/D Objective: Vital Signs: Blood pressure 127/80, pulse (!) 102, temperature 98.9 F (37.2 C), temperature source Oral, resp. rate 18, height '5\' 1"'  (1.549 m), weight 97 kg, SpO2 95 %. No results found. Results for orders placed or performed during the hospital encounter of 11/15/17 (from the past 72 hour(s))  CBC WITH DIFFERENTIAL     Status: None   Collection Time: 11/16/17  6:55 AM  Result Value Ref Range   WBC 10.1 4.0 - 10.5 K/uL   RBC 4.16 3.87 - 5.11 MIL/uL   Hemoglobin 12.6 12.0 - 15.0 g/dL   HCT 39.0 36.0 - 46.0 %   MCV 93.8 78.0 - 100.0 fL   MCH 30.3 26.0 - 34.0 pg   MCHC 32.3 30.0 - 36.0 g/dL   RDW 14.3 11.5 - 15.5 %   Platelets 245 150 - 400 K/uL   Neutrophils Relative % 53 %   Neutro Abs 5.4 1.7 - 7.7 K/uL   Lymphocytes Relative 33 %   Lymphs Abs 3.4 0.7 - 4.0 K/uL   Monocytes Relative 8 %   Monocytes Absolute 0.8 0.1 - 1.0 K/uL   Eosinophils Relative 5 %   Eosinophils Absolute 0.5 0.0 - 0.7 K/uL   Basophils Relative 1 %   Basophils Absolute 0.1 0.0 - 0.1 K/uL   Immature Granulocytes 0 %   Abs Immature Granulocytes 0.0 0.0 - 0.1 K/uL    Comment: Performed at [facility], [address], [place], [place] [address]  Comprehensive metabolic panel     Status: Abnormal   Collection Time: 11/16/17  6:55 AM  Result Value Ref Range   Sodium 139 135 - 145 mmol/L   Potassium 3.7 3.5 - 5.1 mmol/L   Chloride 120 (H) 98 - 111 mmol/L   CO2 15 (L) 22 - 32 mmol/L   Glucose, Bld 97 70 - 99 mg/dL   BUN 11 8 - 23 mg/dL   Creatinine, Ser 1.13 (H) 0.44 - 1.00 mg/dL   Calcium 8.7 (L) 8.9 - 10.3 mg/dL   Total Protein 6.2 (L) 6.5 - 8.1 g/dL   Albumin 2.7 (L) 3.5 - 5.0 g/dL   AST 22 15 - 41 U/L   ALT 40 0 - 44 U/L   Alkaline Phosphatase 86 38 - 126 U/L   Total Bilirubin 0.3 0.3 - 1.2  mg/dL   GFR calc non Af Amer 48 (L) >60 mL/min   GFR calc Af Amer 55 (L) >60 mL/min    Comment: (NOTE) The eGFR has been calculated using the CKD EPI equation. This calculation has not been validated in all clinical situations. eGFR's persistently <60 mL/min signify possible Chronic Kidney Disease.    Anion gap 4 (L) 5 - 15    Comment: Performed at [facility] [address], [place], [place] [address]     HEENT: normal Cardio: RRR and no murmur Resp: CTA B/L and Unlabored GI: BS positive and mild distention Extremity:  No Edema Skin:   Intact Neuro: Alert/Oriented, Abnormal Motor 0/5 in RUE adn RLE 4/5 LUE adn LLE and Dysarthric Musc/Skel:  Other no pain with UE or LE Gen NAD   Assessment/Plan: 1. Functional deficits secondary to Right hemiparesis, left subcortical infarct which require 3+ hours per day of interdisciplinary therapy in a comprehensive inpatient rehab  setting. Physiatrist is providing close team supervision and 24 hour management of active medical problems listed below. Physiatrist and rehab team continue to assess barriers to discharge/monitor patient progress toward functional and medical goals. FIM:                                  Medical Problem List and Plan: 1.  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  2.  DVT Prophylaxis/Anticoagulation: Chronic Eliquis has been resumed. 3. Pain Management: Tylenol as needed CHronic migraines, treated with topamax 185m BID as outpt per PCP, will restart at lower dose and titrate 4. Mood: Xanax 1 mg nightly 5. Neuropsych: This patient is capable of making decisions on her own behalf. 6. Skin/Wound Care: Routine skin checks 7. 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  Advance diet as tolerated 9.  Hypokalemia.  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  Diastolic congestive heart failure.  Demadex 20 mg held due to hypokalemia.  Monitor for any signs of fluid overload Mild pre tib edema R>L no need to restart torsemide yet 12.  Hypertension.  Monitor with increased mobility Vitals:   11/17/17 2140 11/18/17 0413  BP:  127/80  Pulse:  (!) 102  Resp:  18  Temp:  98.9 F (37.2 C)  SpO2: 99% 95%  controlled 10/3 13.  PAF.  Continue Eliquis.  Cardiac rate mildly increased today will monitor 14.  Hypothyroidism.  Continue Synthroid 15.  CKD stage II.  Follow-up chemistries, GFR upper 50s, stable 16.  Hyperlipidemia.  Lipitor LOS (Days) 3 A FACE TO FACE EVALUATION WAS PERFORMED  ACharlett Blake10/04/2017, 7:46 AM

## 2017-11-18 NOTE — Plan of Care (Signed)
  Problem: Consults Goal: RH STROKE PATIENT EDUCATION Description See Patient Education module for education specifics  Outcome: Progressing   Problem: RH BOWEL ELIMINATION Goal: RH STG MANAGE BOWEL WITH ASSISTANCE Description STG Manage Bowel with [place].  Outcome: Progressing Goal: RH STG MANAGE BOWEL W/MEDICATION W/ASSISTANCE Description STG Manage Bowel with Medication with min Assistance.  Outcome: Progressing   Problem: RH BLADDER ELIMINATION Goal: RH STG MANAGE BLADDER WITH ASSISTANCE Description STG Manage Bladder With Min Assistance  Outcome: Progressing   Problem: RH PAIN MANAGEMENT Goal: RH STG PAIN MANAGED AT OR BELOW PT'S PAIN GOAL Description <4 out of 10.   Outcome: Progressing

## 2017-11-19 ENCOUNTER — Inpatient Hospital Stay (HOSPITAL_COMMUNITY): Payer: Medicare Other | Admitting: Physical Therapy

## 2017-11-19 ENCOUNTER — Inpatient Hospital Stay (HOSPITAL_COMMUNITY): Payer: Medicare Other | Admitting: Speech Pathology

## 2017-11-19 ENCOUNTER — Inpatient Hospital Stay (HOSPITAL_COMMUNITY): Payer: Medicare Other

## 2017-11-19 NOTE — Progress Notes (Signed)
Occupational Therapy Session Note  Patient Details  Name: Holly Flores MRN: [id number] Date of Birth: 05-04-1945  Today's Date: 11/19/2017 OT Individual Time: 6770-3403 OT Individual Time Calculation (min): 73 min    Short Term Goals: Week 1:  OT Short Term Goal 1 (Week 1): Pt will don shirt with mod A sitting EOB OT Short Term Goal 2 (Week 1): Pt will complete sit to stand with mod A OT Short Term Goal 3 (Week 1): Pt will complete toilet transfer with max assist of one caregiver  Skilled Therapeutic Interventions/Progress Updates:    1;1. No c/o pain throughout session. Pt utilizes stedy for all transfers and sit to stand during session with min-mod lifting A and VC for midline orientation/weight shifting L. Pt washes UB with HOH A to wash LUE with RUE and A to wash back. Pt dons button up shirt with MAX A to thread LUE, bring around back and button up. Pt educated on velcro clothing fasteners and button hooks as alternatives. OT washes buttocks in standing with tactile cues for weight shifitng. Pt tolerates 25 min total NMES on small atrophy setting (15 min extensors setting 35; 10 min flexors setting 30) while grasp/release cone and hand shake. Exited session with pt seated in w/c, belt alarm on place and call light in reach.  Therapy Documentation Precautions:  Precautions Precautions: Fall Precaution Comments: R hemi, R inattention Restrictions Weight Bearing Restrictions: No General:   Vital Signs:   Pain: Pain Assessment Pain Scale: 0-10 Pain Score: 0-No pain   Therapy/Group: Individual Therapy  Tonny Branch 11/19/2017, 10:33 AM

## 2017-11-19 NOTE — Progress Notes (Signed)
  Subjective/Complaints: No HA today, denies abd pain Pt has her pill bottle from home , topiramate 100mg  BID  ROS- no CP, SOB, N/V/D Objective: Vital Signs: Blood pressure 129/75, pulse 62, temperature 98.6 F (37 C), temperature source Oral, resp. rate 15, height 5\' 1"  (1.549 m), weight 97 kg, SpO2 99 %. No results found. No results found for this or any previous visit (from the past 72 hour(s)).   HEENT: normal Cardio: RRR and no murmur Resp: CTA B/L and Unlabored GI: BS positive and mild distention Extremity:  No Edema Skin:   Intact Neuro: Alert/Oriented, Abnormal Motor 0/5 in RUE adn RLE 4/5 LUE adn LLE and Dysarthric Musc/Skel:  Other no pain with UE or LE Gen NAD   Assessment/Plan: 1. Functional deficits secondary to Right hemiparesis, left subcortical infarct which require 3+ hours per day of interdisciplinary therapy in a comprehensive inpatient rehab setting. Physiatrist is providing close team supervision and 24 hour management of active medical problems listed below. Physiatrist and rehab team continue to assess barriers to discharge/monitor patient progress toward functional and medical goals. FIM:                                  Medical Problem List and Plan: 1.  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 Cont CIR PT, OT, SLP 2.  DVT Prophylaxis/Anticoagulation: Chronic Eliquis has been resumed. 3. Pain Management: Tylenol as needed CHronic migraines, treated with topamax 100mg  BID as outpt per PCP, will restart at 25mg  BID- no HA this am 4. Mood: Xanax 1 mg nightly 5. Neuropsych: This patient is capable of making decisions on her own behalf. 6. Skin/Wound Care: Routine skin checks 7. 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  Advance diet as tolerated 9.  Hypokalemia.  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  Diastolic congestive heart failure.  Demadex 20 mg held due to hypokalemia.  Monitor for any signs of fluid overload Mild pre tib edema R>L no need to restart torsemide yet 12.  Hypertension.  Monitor with increased mobility Vitals:   11/18/17 2210 11/19/17 0626  BP:  129/75  Pulse:  62  Resp:  15  Temp:  98.6 F (37 C)  SpO2: 99% 99%  controlled 10/4 13.  PAF.  Continue Eliquis.  Cardiac rate controlled 14.  Hypothyroidism.  Continue Synthroid 15.  CKD stage II.  Follow-up chemistries, GFR upper 50s, stable 16.  Hyperlipidemia.  Lipitor LOS (Days) 4 A FACE TO FACE EVALUATION WAS PERFORMED  Charlett Blake 11/19/2017, 7:10 AM

## 2017-11-19 NOTE — Progress Notes (Signed)
Physical Therapy Session Note  Patient Details  Name: Holly Flores MRN: [id number] Date of Birth: 1945-08-25  Today's Date: 11/19/2017 PT Individual Time: 1415-1530 PT Individual Time Calculation (min): 75 min   Short Term Goals: Week 1:  PT Short Term Goal 1 (Week 1): Pt will perform bed mobility with mod assist PT Short Term Goal 2 (Week 1): pt will perform bed<>chair transfers with mod assist  PT Short Term Goal 3 (Week 1): Pt will propel w/c x 50 ft with min assist  Skilled Therapeutic Interventions/Progress Updates:    Pt received seated in w/c in room, agreeable to PT. Pt reports some L lower quadrant abdominal pain, not rated and declines any intervention. Sit to stand x 3 reps in // bars with min to mod A, focus on R quad control with transfer and upright posture. Max multimodal cueing for glute activation for hip ext and weight shift into midline from leaning to the L. Pt fatigues quickly in standing. Squat pivot transfer w/c to/from mat table with max A and max cueing for weight shift and body placement. Tightened pt's w/c brakes as they were loose. Sit to stand x 3 reps from mat table to hemiwalker with max A, max manual cues through RLE for knee ext. Pt reports feeling task is more difficult this date. Squat pivot transfer back to bed with max A. Sit to supine mod A for BLE management. Pt left semi-reclined in bed with needs in reach and bed alarm set.  Therapy Documentation Precautions:  Precautions Precautions: Fall Precaution Comments: R hemi, R inattention Restrictions Weight Bearing Restrictions: No  Therapy/Group: Individual Therapy  Excell Seltzer, PT, DPT  11/19/2017, 4:01 PM

## 2017-11-19 NOTE — Plan of Care (Signed)
  Problem: Consults Goal: RH STROKE PATIENT EDUCATION Description See Patient Education module for education specifics  Outcome: Progressing   Problem: RH BOWEL ELIMINATION Goal: RH STG MANAGE BOWEL WITH ASSISTANCE Description STG Manage Bowel with [place].  Outcome: Progressing Goal: RH STG MANAGE BOWEL W/MEDICATION W/ASSISTANCE Description STG Manage Bowel with Medication with min Assistance.  Outcome: Progressing   Problem: RH BLADDER ELIMINATION Goal: RH STG MANAGE BLADDER WITH ASSISTANCE Description STG Manage Bladder With Min Assistance  Outcome: Progressing   Problem: RH PAIN MANAGEMENT Goal: RH STG PAIN MANAGED AT OR BELOW PT'S PAIN GOAL Description <4 out of 10.   Outcome: Progressing

## 2017-11-19 NOTE — Progress Notes (Signed)
Speech Language Pathology Daily Session Note  Patient Details  Name: Holly Flores MRN: [id number] Date of Birth: 22-Aug-1945  Today's Date: 11/19/2017 SLP Individual Time: 7615-1834 SLP Individual Time Calculation (min): 45 min  Short Term Goals: Week 1: SLP Short Term Goal 1 (Week 1): Pt will consume regular texture trials without overt s/s aspiration with Mod I for use of swallowing compensatory strategies over 2 sessions prior to upgrade.  SLP Short Term Goal 2 (Week 1): Patient will utilize speech intelligibility strategies at the phrase level with supervision verbal cues.  SLP Short Term Goal 3 (Week 1): Patient will utilize diaphragmatic breathing at the phrase level with Min A verbal cues.   Skilled Therapeutic Interventions: Skilled treatment session focused on dysphagia and speech goals. SLP facilitated session by providing skilled observation with breakfast meal of regular textures with thin liquids. Patient consumed meal without overt s/s of aspiration and was overall Mod I for use of swallowing compensatory strategies. SLP also facilitated session by administering RMT testing. Patient's peak MIP was 5 cm H2O and peak MEP of 52 cm H20. Both values are below the LLN, therefore, IMST and EMST devices will be introduced at next session. Continue with current plan of care.        Pain No/Denies Pain   Therapy/Group: Individual Therapy  Evette Diclemente 11/19/2017, 3:30 PM

## 2017-11-20 ENCOUNTER — Inpatient Hospital Stay (HOSPITAL_COMMUNITY): Payer: Medicare Other

## 2017-11-20 ENCOUNTER — Inpatient Hospital Stay (HOSPITAL_COMMUNITY): Payer: Medicare Other | Admitting: Physical Therapy

## 2017-11-20 ENCOUNTER — Inpatient Hospital Stay (HOSPITAL_COMMUNITY): Payer: Medicare Other | Admitting: Speech Pathology

## 2017-11-20 DIAGNOSIS — N182 Chronic kidney disease, stage 2 (mild): Secondary | ICD-10-CM

## 2017-11-20 DIAGNOSIS — E46 Unspecified protein-calorie malnutrition: Secondary | ICD-10-CM

## 2017-11-20 DIAGNOSIS — E8809 Other disorders of plasma-protein metabolism, not elsewhere classified: Secondary | ICD-10-CM

## 2017-11-20 DIAGNOSIS — G43919 Migraine, unspecified, intractable, without status migrainosus: Secondary | ICD-10-CM

## 2017-11-20 DIAGNOSIS — E876 Hypokalemia: Secondary | ICD-10-CM

## 2017-11-20 DIAGNOSIS — I63429 Cerebral infarction due to embolism of unspecified anterior cerebral artery: Secondary | ICD-10-CM

## 2017-11-20 NOTE — Progress Notes (Signed)
Pt refusing CPAP for the night. Rt will continue to monitor as needed. 

## 2017-11-20 NOTE — Plan of Care (Signed)
  Problem: Consults Goal: RH STROKE PATIENT EDUCATION Description See Patient Education module for education specifics  Outcome: Progressing   Problem: RH BOWEL ELIMINATION Goal: RH STG MANAGE BOWEL WITH ASSISTANCE Description STG Manage Bowel with [place].  Outcome: Progressing Goal: RH STG MANAGE BOWEL W/MEDICATION W/ASSISTANCE Description STG Manage Bowel with Medication with min Assistance.  Outcome: Progressing   Problem: RH BLADDER ELIMINATION Goal: RH STG MANAGE BLADDER WITH ASSISTANCE Description STG Manage Bladder With Min Assistance  Outcome: Progressing   Problem: RH PAIN MANAGEMENT Goal: RH STG PAIN MANAGED AT OR BELOW PT'S PAIN GOAL Description <4 out of 10.   Outcome: Progressing

## 2017-11-20 NOTE — Progress Notes (Signed)
  Subjective/Complaints: Patient seen laying in bed this morning.  She states she slept well overnight.  ROS Denies CP, SOB, N/V/D  Objective: Vital Signs: Blood pressure 123/75, pulse 76, temperature 98 F (36.7 C), temperature source Oral, resp. rate 17, height 5\' 1"  (1.549 m), weight 97 kg, SpO2 97 %. No results found. No results found for this or any previous visit (from the past 72 hour(s)).   Constitutional: No distress . Vital signs reviewed. HENT: Normocephalic.  Atraumatic. Eyes: EOMI. No discharge. Cardiovascular: RRR. No JVD. Respiratory: CTA Bilaterally. Normal effort. GI: BS +.  Mildly distended Musc: No edema or tenderness in extremities. Skin:   Intact.  Warm and dry. Neuro: Alert/Oriented Motor: 0/5 in RUE/RLE 4/5 LUE/LLE  Dysarthria   Assessment/Plan: 1. Functional deficits secondary to Right hemiparesis, left subcortical infarct which require 3+ hours per day of interdisciplinary therapy in a comprehensive inpatient rehab setting. Physiatrist is providing close team supervision and 24 hour management of active medical problems listed below. Physiatrist and rehab team continue to assess barriers to discharge/monitor patient progress toward functional and medical goals. FIM:                                  Medical Problem List and Plan: 1.  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  Cont CIR  2.  DVT Prophylaxis/Anticoagulation: Chronic Eliquis has been resumed. 3. Pain Management: Tylenol as needed  CHronic migraines, treated with topamax 100mg  BID as outpt per PCP, restarted at 25mg  BID 4. Mood: Xanax 1 mg nightly 5. Neuropsych: This patient is?  Fully capable of making decisions on her own behalf. 6. Skin/Wound  Care: Routine skin checks 7. 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  Advance diet as tolerated 9.  Hypokalemia.    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  Advance to regular diet tolerating without difficulty.   11.  Diastolic congestive heart failure.  Demadex 20 mg held due to hypokalemia.  Monitor for any signs of fluid overload  Mild pre tib edema R>L no need to restart torsemide 12.  Hypertension.  Monitor with increased mobility Vitals:   11/19/17 1953 11/20/17 0424  BP: 129/72 123/75  Pulse: 69 76  Resp: 16 17  Temp: 98.7 F (37.1 C) 98 F (36.7 C)  SpO2: 100% 97%   Controlled 10/5 13.  PAF.  Continue Eliquis.  Cardiac rate controlled 14.  Hypothyroidism.  Continue Synthroid 15.  CKD stage II.    Creatinine 1.13 on 10/1  Stable 16.  Hyperlipidemia.  Lipitor 17.  Hypoalbuminemia  Supplement initiated  LOS (Days) 5 A FACE TO FACE EVALUATION WAS PERFORMED  Holly Flores Lorie Phenix 11/20/2017, 8:31 AM

## 2017-11-20 NOTE — Progress Notes (Signed)
Speech Language Pathology Daily Session Note  Patient Details  Name: ANGELLE ISAIS MRN: [id number] Date of Birth: 10/11/1945  Today's Date: 11/20/2017 SLP Individual Time: 0930-1010 SLP Individual Time Calculation (min): 40 min  Short Term Goals: Week 1: SLP Short Term Goal 1 (Week 1): Pt will consume regular texture trials without overt s/s aspiration with Mod I for use of swallowing compensatory strategies over 2 sessions prior to upgrade.  SLP Short Term Goal 2 (Week 1): Patient will utilize speech intelligibility strategies at the phrase level with supervision verbal cues.  SLP Short Term Goal 3 (Week 1): Patient will utilize diaphragmatic breathing at the phrase level with Min A verbal cues.   Skilled Therapeutic Interventions: Skilled treatment session focused on speech goals. SLP facilitated session by providing Mod A verbal cues for patient to perform 15 repetitions of IMST exercises accurately with a self-perceived effort level at 9 cm H2O. However, patient performed 25 repetitions of EMST exercises with supervision verbal cues for accuracy and a self-perceived effort level of 7/10 at 20 cm H2O. Patient participate in an informal functional conversation in a minimally noisy environment with supervision verbal cues needed for an increased vocal intensity to achieve ~100% intelligibility. Patient left upright in wheelchair with all needs within reach and alarm on. Continue with current plan of care.          Pain No/Denies Pain   Therapy/Group: Individual Therapy  Kevia Zaucha 11/20/2017, 11:48 AM

## 2017-11-20 NOTE — Progress Notes (Signed)
Physical Therapy Session Note  Patient Details  Name: Holly Flores MRN: [id number] Date of Birth: Sep 16, 1945  Today's Date: 11/20/2017 PT Individual Time: 0800-0905 PT Individual Time Calculation (min): 65 min   Short Term Goals: Week 1:  PT Short Term Goal 1 (Week 1): Pt will perform bed mobility with mod assist PT Short Term Goal 2 (Week 1): pt will perform bed<>chair transfers with mod assist  PT Short Term Goal 3 (Week 1): Pt will propel w/c x 50 ft with min assist  Skilled Therapeutic Interventions/Progress Updates:   Pt in supine and agreeable to therapy, denies pain. Transferred to EOB w/ min assist, min assist to maintain static sitting while therapist set-up transfer. Max assist stand/squat pivot to w/c. Total assist w/c transport to therapy gym for time management. Session focused on standing at rail and pre-gait tasks including lateral weight shifting, L forward/backward stepping, and mini-squats. Min assist to boost into standing at rail. Performed many reps of each to facilitate and encourage R WB, used mirror for visual feedback on posture and frontal plane weight distribution. Max manual assist to prevent R knee buckle, tactile cues for quad activation, and max manual/verbal encouragement to perform R weight-shifting. Pt very fearful of R knee buckling. Pt self-propelled w/c back to room to practice L hemi technique. Mod assist overall w/ frequent verbal and manual cues for technique. Needed L shoe on for foot to reach ground as well. Frequent brief rest breaks 2/2 fatigue w/ self-propelling. Ended session in w/c, call bell in reach and all needs met.   Therapy Documentation Precautions:  Precautions Precautions: Fall Precaution Comments: R hemi, R inattention Restrictions Weight Bearing Restrictions: No Vital Signs:   Pain: Pain Assessment Pain Scale: 0-10 Pain Score: 0-No pain Faces Pain Scale: No hurt Pain Type: Chronic pain Pain Location: Head Pain Descriptors  / Indicators: Headache Pain Onset: Gradual Pain Intervention(s): Medication (See eMAR)  Therapy/Group: Individual Therapy  Damani Kelemen K Rhythm Wigfall 11/20/2017, 9:10 AM

## 2017-11-20 NOTE — Progress Notes (Signed)
Occupational Therapy Session Note  Patient Details  Name: Holly Flores MRN: [id number] Date of Birth: Jun 16, 1945  Today's Date: 11/20/2017 OT Individual Time: 1335-1443 OT Individual Time Calculation (min): 68 min    Short Term Goals: Week 1:  OT Short Term Goal 1 (Week 1): Pt will don shirt with mod A sitting EOB OT Short Term Goal 2 (Week 1): Pt will complete sit to stand with mod A OT Short Term Goal 3 (Week 1): Pt will complete toilet transfer with max assist of one caregiver  Skilled Therapeutic Interventions/Progress Updates:    1:1.pt seated in w/c upon arrival with "a little" shoulder pain in L shoulder "from over use." RN alerted at end of session and hot pack applied for comfort. Pt agreeable to bathing and dressing at shower level. Pt completes transfer with steady A in stedy w/c<>TTB in shower. Pt bathes seated on TTB with HOH A of RUE to wash LUE,  A to wash B feet, and VC for leaning laterally as OT washes buttocks. No LOB noted while bathing. Pt dresses with overall MAX A to don pull over shirt with VC for hemi techniques. Pt requires overall total A for LB dressing in stedy sit to stand as stated above. Pt sit to stand at rail in front of mirror with OT controlling RUE from buckling with A to facilitate extension. Pt with improved activation of quads when performing 1x5 mini squats. Exited session with pt seated in w/c, belt alarm on call ligh tin reach and RUE on half lap tray.  Therapy Documentation Precautions:  Precautions Precautions: Fall Precaution Comments: R hemi, R inattention Restrictions Weight Bearing Restrictions: No   Therapy/Group: Individual Therapy  Tonny Branch 11/20/2017, 2:42 PM

## 2017-11-21 ENCOUNTER — Inpatient Hospital Stay (HOSPITAL_COMMUNITY): Payer: Medicare Other

## 2017-11-21 DIAGNOSIS — I10 Essential (primary) hypertension: Secondary | ICD-10-CM

## 2017-11-21 NOTE — Progress Notes (Signed)
Physical Therapy Session Note  Patient Details  Name: Holly Flores MRN: [id number] Date of Birth: Sep 20, 1945  Today's Date: 11/21/2017 PT Individual Time: 1610-1650 PT Individual Time Calculation (min): 40 min   Short Term Goals: Week 1:  PT Short Term Goal 1 (Week 1): Pt will perform bed mobility with mod assist PT Short Term Goal 2 (Week 1): pt will perform bed<>chair transfers with mod assist  PT Short Term Goal 3 (Week 1): Pt will propel w/c x 50 ft with min assist     Skilled Therapeutic Interventions/Progress Updates:   PT donned bil TEDS and L shoe with pt sitting in w/c.    Pt currently has womens 9.5W shoes and mens 8W shoes; R foot would not fit into either shoe today, due to edema.    W/c propulsion with 2 pillows behind pt's back, using hemi technique, with min assist for steering.  neuromuscular re-education via forced use for bil LE reciprocal movement using Kinetron sitting in w/c, (scooted out and sitting upright to target gluteal muscles)at 50 cm/sec, x 25 cycles, and unilateral use of RLE x 10 cycles x 3.  Reciprocal scooting forward/backward in w/c with max assist.  With ACE around R knee for confidence and stability, pre-gait wt shifting in standing with L hand on hallway railing.  Gait training x 8' with mo/max assist for R knee stance stability, RLE forward progression.    Pt may benefit from [facility] for gait training.    Pt left resting in w/c with needs at hand and seat belt alarm set.  Therapy Documentation Precautions:  Precautions Precautions: Fall Precaution Comments: R hemi, R inattention Restrictions Weight Bearing Restrictions: No  Pain:pt denies      Therapy/Group: Individual Therapy  Brazil Voytko 11/21/2017, 5:06 PM

## 2017-11-21 NOTE — Progress Notes (Signed)
Patient refused CPAP.

## 2017-11-21 NOTE — Progress Notes (Signed)
  Subjective/Complaints: Patient seen laying in bed this morning.  She states she slept well overnight.  She denies complaints.  ROS denies CP, SOB, N/V/D  Objective: Vital Signs: Blood pressure 134/87, pulse 85, temperature 98.5 F (36.9 C), temperature source Oral, resp. rate 18, height 5\' 1"  (1.549 m), weight 97 kg, SpO2 100 %. No results found. No results found for this or any previous visit (from the past 72 hour(s)).   Constitutional: No distress . Vital signs reviewed. HENT: Normocephalic.  Atraumatic. Eyes: EOMI. No discharge. Cardiovascular: RRR.  No JVD. Respiratory: CTA bilaterally.  Normal effort. GI: BS +.  Mildly distended Musc: No edema or tenderness in extremities. Skin:   Intact.  Warm and dry. Neuro: Alert/Oriented Motor: 0/5 in RUE/RLE, stable 4/5 LUE/LLE  Dysarthria   Assessment/Plan: 1. Functional deficits secondary to Right hemiparesis, left subcortical infarct which require 3+ hours per day of interdisciplinary therapy in a comprehensive inpatient rehab setting. Physiatrist is providing close team supervision and 24 hour management of active medical problems listed below. Physiatrist and rehab team continue to assess barriers to discharge/monitor patient progress toward functional and medical goals. FIM:                                  Medical Problem List and Plan: 1.  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  Cont CIR  2.  DVT Prophylaxis/Anticoagulation: Chronic Eliquis has been resumed. 3. Pain Management: Tylenol as needed  Chronic migraines, treated with topamax 100mg  BID as outpt per PCP, restarted at 25mg  BID  Controlled at present 4. Mood: Xanax 1 mg nightly 5. Neuropsych: This patient is?  Fully  capable of making decisions on her own behalf. 6. Skin/Wound Care: Routine skin checks 7. 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  Advance diet as tolerated 9.  Hypokalemia.    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  Advance to regular diet tolerating without difficulty.   11.  Diastolic congestive heart failure.  Demadex 20 mg held due to hypokalemia.  Monitor for any signs of fluid overload  Mild pre tib edema R>L no need to restart torsemide 12.  Hypertension.  Monitor with increased mobility Vitals:   11/20/17 2022 11/21/17 0702  BP: 124/76 134/87  Pulse: 65 85  Resp: 18 18  Temp: 98.6 F (37 C) 98.5 F (36.9 C)  SpO2: 100% 100%   Controlled 10/6 13.  PAF.  Continue Eliquis.  Cardiac rate controlled 14.  Hypothyroidism.  Continue Synthroid 15.  CKD stage II.    Creatinine 1.13 on 10/1  Stable 16.  Hyperlipidemia.  Lipitor 17.  Hypoalbuminemia  Supplement initiated  LOS (Days) 6 A FACE TO FACE EVALUATION WAS PERFORMED  Briahnna Harries Lorie Phenix 11/21/2017, 7:27 AM

## 2017-11-21 NOTE — Plan of Care (Signed)
  Problem: RH PAIN MANAGEMENT Goal: RH STG PAIN MANAGED AT OR BELOW PT'S PAIN GOAL Description <4 out of 10.   Outcome: Progressing  Continue to assess, administer pain regimen as ordered

## 2017-11-22 ENCOUNTER — Inpatient Hospital Stay (HOSPITAL_COMMUNITY): Payer: Medicare Other

## 2017-11-22 ENCOUNTER — Inpatient Hospital Stay (HOSPITAL_COMMUNITY): Payer: Medicare Other | Admitting: Occupational Therapy

## 2017-11-22 NOTE — Plan of Care (Signed)
  Problem: RH PAIN MANAGEMENT Goal: RH STG PAIN MANAGED AT OR BELOW PT'S PAIN GOAL Description <4 out of 10.   Outcome: Progressing Assess pain, administered pain regimen as needed

## 2017-11-22 NOTE — Progress Notes (Signed)
Pt declines the use of CPAP.  

## 2017-11-22 NOTE — Progress Notes (Signed)
Occupational Therapy Session Note  Patient Details  Name: AKIAH BAUCH MRN: [id number] Date of Birth: 01/19/1946  Today's Date: 11/22/2017 OT Individual Time: 1400-1445 OT Individual Time Calculation (min): 45 min   Today's Date: 11/22/2017 OT Missed Time: 30 Minutes Missed Time Reason: Patient fatigue   Short Term Goals: Week 1:  OT Short Term Goal 1 (Week 1): Pt will don shirt with mod A sitting EOB OT Short Term Goal 2 (Week 1): Pt will complete sit to stand with mod A OT Short Term Goal 3 (Week 1): Pt will complete toilet transfer with max assist of one caregiver  Skilled Therapeutic Interventions/Progress Updates:    Upon entering the room, pt seated in wheelchair with c/o fatigue. Pt verbalized sudden urge for toileting. OT assisted pt via wheelchair into bathroom with total A squat pivot to the L onto commode. Sit >stand from toilet with max A for lifting and lowering to remove clothing items. Pt having diarrhea but doesn't report discomfort. OT assisted pt with doffing remainder of clothing from commode chair and transferring from toilet>TTB with use of STEDY for safety. Pt bathing self with hand over hand assistance to utilize R UE in functional tasks. Pt needing mod cuing to wash R side of body this session. Pt standing from TTB into STEDY to don LB pants with max A and min- mod A standing balance in stedy. Pt transferred onto EOB. Pt donning button down shirt with max A to don onto R UE, pull over back, and button. Pt returning to supine with max A. Pt reports increased fatigue and declined further OT intervention at this time. Bed alarm activated and call bell within reach.   Therapy Documentation Precautions:  Precautions Precautions: Fall Precaution Comments: R hemi, R inattention Restrictions Weight Bearing Restrictions: No General: General OT Amount of Missed Time: 30 Minutes Pain: Pain Assessment Pain Score: 0-No pain ADL: ADL Eating: Set up Where  Assessed-Eating: Wheelchair Grooming: Setup Where Assessed-Grooming: Wheelchair Upper Body Bathing: Moderate assistance, Minimal cueing Where Assessed-Upper Body Bathing: Edge of bed Lower Body Bathing: Moderate assistance, Moderate cueing Where Assessed-Lower Body Bathing: Wheelchair Upper Body Dressing: Maximal assistance, Moderate cueing Where Assessed-Upper Body Dressing: Edge of bed Lower Body Dressing: Maximal assistance Where Assessed-Lower Body Dressing: Edge of bed Toileting: Maximal assistance Where Assessed-Toileting: Bedside Commode Toilet Transfer: Dependent, Moderate verbal cueing Toilet Transfer Method: Other (comment)(stedy) Science writer: Bedside commode Tub/Shower Transfer: Unable to assess Tub/Shower Transfer Method: Unable to assess [employer] Transfer: Unable to assess [employer] Method: Unable to assess   Therapy/Group: Individual Therapy  Gypsy Decant 11/22/2017, 3:14 PM

## 2017-11-22 NOTE — Progress Notes (Signed)
Physical Therapy Session Note  Patient Details  Name: Holly Flores MRN: [id number] Date of Birth: 19-Oct-1945  Today's Date: 11/22/2017 PT Individual Time: 1829-9371 and 1100-1155 PT Individual Time Calculation (min): 45 min and 55 min   Short Term Goals: Week 1:  PT Short Term Goal 1 (Week 1): Pt will perform bed mobility with mod assist PT Short Term Goal 2 (Week 1): pt will perform bed<>chair transfers with mod assist  PT Short Term Goal 3 (Week 1): Pt will propel w/c x 50 ft with min assist  Skilled Therapeutic Interventions/Progress Updates:    Session 1: Pt supine in bed upon PT arrival, agreeable to therapy tx and denies pain. Therapist donned teds, socks and R AFO with total assist, difficulty getting R AFO into shoe secondary to decreased depth of shoe. Talked to patient about bringing in a deeper shoe to allow better fit of trial AFO. Pt transferred supine>sitting EOB with mod assist. Pt performed sit<>stand from EOB with UE support on chair, mod assist. Pt performed stand pivot from bed>w/c with max assist, verbal cues for techniques and manual facilitation for weightshift. Pt transported to the gym. Pt performed sit<>stands x4 this session using L rail for UE support, mod assist. In standing pt worked on pre-gait stepping with L LE in place, therapist blocking R knee, x 2 trials. In standing worked on Dietitian without UE support to toss horseshoes, x 2 trials with mod assist for balance. Pt transported back to room and transferred to recliner, stand pivot with max assist. Pt left seated in recliner with needs in reach.   Session 2: Pt seated in recliner upon PT arrival, agreeable to therapy tx and denies pain. Pt performed stand pivot transfer from recliner>w/c with max assist, verbal cues for techniques and transported to the gym. Pt worked on pre-gait and R stance control stepping in place with L LE, x 2 trials. Pt worked on sit<>stands using rail to stand and then emphasis  on eccentric lowering to sit without UE support, mod-max assist. Pt ambulated x 10 ft at L handrail with R ace wrap for DF and max assist, manual facilitation for R hip extension during stance and therapist blocking R knee, therapist also assisting with advancement of R LE during swing. Second trial of gait at the rail with R AFO, max assist x 8 ft with manual facilitation for weightshifting, hip extension and blocking R knee, increased assist needed to step with R LE. Pt used cybex kinetron this session working on R hip extension and reciprocal LE movement x 2 trials. Pt worked on w/c propulsion x 100 ft with min assist needed for steering and verbal cues for techniques. Pt transported back to room and left seated in w/c with needs in reach.    Therapy Documentation Precautions:  Precautions Precautions: Fall Precaution Comments: R hemi, R inattention Restrictions Weight Bearing Restrictions: No    Therapy/Group: Individual Therapy  Netta Corrigan , PT, DPT 11/22/2017, 7:55 AM

## 2017-11-22 NOTE — Progress Notes (Signed)
  Subjective/Complaints: No issues overnite except poor sleep. States she took xanax qhs at home 1mg  for sleep  ROS denies CP, SOB, N/V/D  Objective: Vital Signs: Blood pressure 124/75, pulse 89, temperature 98.4 F (36.9 C), temperature source Oral, resp. rate 19, height 5\' 1"  (1.549 m), weight 99.9 kg, SpO2 96 %. No results found. No results found for this or any previous visit (from the past 72 hour(s)).   Constitutional: No distress . Vital signs reviewed. HENT: Normocephalic.  Atraumatic. Eyes: EOMI. No discharge. Cardiovascular: RRR.  No JVD. Respiratory: CTA bilaterally.  Normal effort. GI: BS +.  Mildly distended, non tender Musc: No edema or tenderness in extremities. Skin:   Intact.  Warm and dry. Neuro: Alert/Oriented Motor: 0/5 in RUE/RLE, stable 4/5 LUE/LLE  Dysarthria   Assessment/Plan: 1. Functional deficits secondary to Right hemiparesis, left subcortical infarct which require 3+ hours per day of interdisciplinary therapy in a comprehensive inpatient rehab setting. Physiatrist is providing close team supervision and 24 hour management of active medical problems listed below. Physiatrist and rehab team continue to assess barriers to discharge/monitor patient progress toward functional and medical goals. FIM:                                  Medical Problem List and Plan: 1.  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  Cont CIR  2.  DVT Prophylaxis/Anticoagulation: Chronic Eliquis has been resumed. 3. Pain Management: Tylenol as needed  Chronic migraines, treated with topamax 100mg  BID as outpt per PCP, restarted at 25mg  BID  no0 need to increase at this time 4. Mood: Xanax 1 mg nightly 5. Neuropsych: This patient is?   Fully capable of making decisions on her own behalf. 6. Skin/Wound Care: Routine skin checks 7. 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  Advance diet as tolerated 9.  Hypokalemia.    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  Advance to regular diet tolerating without difficulty.   11.  Diastolic congestive heart failure.  Demadex 20 mg held due to hypokalemia.  Monitor for any signs of fluid overload  Mild pre tib edema R>L no need to restart torsemide 12.  Hypertension.  Monitor with increased mobility Vitals:   11/21/17 1956 11/22/17 0401  BP: 129/75 124/75  Pulse: (!) 57 89  Resp: 15 19  Temp: 98.1 F (36.7 C) 98.4 F (36.9 C)  SpO2: 100% 96%   Controlled 10/7 13.  PAF.  Continue Eliquis.  Cardiac rate controlled 10/7 14.  Hypothyroidism.  Continue Synthroid 15.  CKD stage II.    Creatinine 1.13 on 10/1  Stable 16.  Hyperlipidemia.  Lipitor 17.  Hypoalbuminemia  Supplement initiated  LOS (Days) 7 A FACE TO FACE EVALUATION WAS PERFORMED  Charlett Blake 11/22/2017, 7:46 AM

## 2017-11-22 NOTE — Progress Notes (Signed)
Speech Language Pathology Weekly Progress and Session Note  Patient Details  Name: Holly Flores MRN: [id number] Date of Birth: 30-Oct-1945  Beginning of progress report period: November 16, 2017 End of progress report period: November 22, 2017  Today's Date: 11/22/2017 SLP Individual Time: 1300-1330 SLP Individual Time Calculation (min): 30 min  Short Term Goals: Week 1: SLP Short Term Goal 1 (Week 1): Pt will consume regular texture trials without overt s/s aspiration with Mod I for use of swallowing compensatory strategies over 2 sessions prior to upgrade.  SLP Short Term Goal 1 - Progress (Week 1): Met SLP Short Term Goal 2 (Week 1): Patient will utilize speech intelligibility strategies at the phrase level with supervision verbal cues.  SLP Short Term Goal 2 - Progress (Week 1): Met SLP Short Term Goal 3 (Week 1): Patient will utilize diaphragmatic breathing at the phrase level with Min A verbal cues.  SLP Short Term Goal 3 - Progress (Week 1): Met    New Short Term Goals: Week 2: SLP Short Term Goal 1 (Week 2): Patient will utilize speech intelligibility strategies at the conversation level Mod I in moderate noisy environment.   Weekly Progress Updates: Pt demonstrated great progress meeting 3 out 3 goals, currently supervision A for speech intelligibility in conversation.  Pt demonstrated ability to utilize swallow strategies with Mod I in upgraded texture, regular diet. Pt demonstrated gains in coordination of phonation and respiration with use of diaphragmatic breathing exercises at sentence level. Pt demonstrated improvement in breath support and vocal intensity through return demonstration of RMT, and increased resistance in IMST from 9 cm to 13 cm H20 and EMST is at highest resistance 20 cm H20 resulting in speech intelligibility in conversation with supervision A verbal cues in limited noise environment. ST will focus on speech intelligibility strategies in variety of environments  at Mod I level piror from ST discharge.       Intensity: Minumum of 1-2 x/day, 30 to 90 minutes Frequency: 3 to 5 out of 7 days Duration/Length of Stay: 10/23 Treatment/Interventions: Cueing hierarchy;Functional tasks;Speech/Language facilitation;Dysphagia/aspiration precaution training   Daily Session  Skilled Therapeutic Interventions: Skilled ST services focused on speech skills. SLP facilitated speech intelligibility in conversation in moderately noisy environment pt required supervision A verbal cues.  SLP facilitated  returned demonstration of IMST set at 9 cm H20 completed 25 repetitions self-perceived score of 4/10 and  EMST set at 20 cm H20 completed 25 repetitions with self-perceived score of 6/10. SLP increased IMST to 13 cm H20, pt completed 5 repetitions and stated 7/10 self-percived effort. SLP facilitated informal conversation with TV on, pt required supervision A verbal cues for speech intelligibility. SLP education pt on progress this reporting period, and likely upcoming discharge in future sessions due to near baseline level. Pt stated agreement.Pt was left in room with call bell within reach and bed alarm set. Recommend to continue skilled ST services.      Function:    General    Pain Pain Assessment Pain Score: 0-No pain  Therapy/Group: Individual Therapy  Ninamarie Keel  [facility] 11/22/2017, 3:02 PM

## 2017-11-23 ENCOUNTER — Inpatient Hospital Stay (HOSPITAL_COMMUNITY): Payer: Medicare Other | Admitting: Speech Pathology

## 2017-11-23 ENCOUNTER — Inpatient Hospital Stay (HOSPITAL_COMMUNITY): Payer: Medicare Other | Admitting: Occupational Therapy

## 2017-11-23 ENCOUNTER — Inpatient Hospital Stay (HOSPITAL_COMMUNITY): Payer: Self-pay

## 2017-11-23 ENCOUNTER — Inpatient Hospital Stay (HOSPITAL_COMMUNITY): Payer: Self-pay | Admitting: Physical Therapy

## 2017-11-23 NOTE — Progress Notes (Signed)
Physical Therapy Session Note  Patient Details  Name: Holly Flores MRN: [id number] Date of Birth: [birth year]  Today's Date: 11/23/2017 PT Individual Time: 1430-1515 PT Individual Time Calculation (min): 45 min   Short Term Goals: Week 1:  PT Short Term Goal 1 (Week 1): Pt will perform bed mobility with mod assist PT Short Term Goal 2 (Week 1): pt will perform bed<>chair transfers with mod assist  PT Short Term Goal 3 (Week 1): Pt will propel w/c x 50 ft with min assist  Skilled Therapeutic Interventions/Progress Updates:    Pt seated in w/c upon PT arrival, agreeable to therapy tx and denies pain. Pt transported to the gym. Pt performed stand pivot transfer to the mat towards the L with mod assist. Pt transferred sit>supine with mod assist and to sidelying with mod assist. In L sidelying pt worked on gravity eliminated AAROM with R LE using powderboard including hip flexion/extension and knee extension for neuro re-ed, tactile cues for muscle activation/isolation. Pt transferred to sitting EOB with mod assist and transferred back to w/c with max assist to the R. Pt performed sit<>stand at rail with min assist, L shoe on, R socks with DF ACE wrap pt worked on swing phase kicking yoga block with R LE 2 x 10. Pt transported back to room and left seated with needs in reach and chair alarm set.   Therapy Documentation Precautions:  Precautions Precautions: Fall Precaution Comments: R hemi, R inattention Restrictions Weight Bearing Restrictions: No   Therapy/Group: Individual Therapy  Netta Corrigan, PT, DPT 11/23/2017, 12:51 PM

## 2017-11-23 NOTE — Progress Notes (Addendum)
Occupational Therapy Session Note  Patient Details  Name: Holly Flores MRN: [id number] Date of Birth: 1945-04-26  Today's Date: 11/23/2017 OT Individual Time: 1281-1886 OT Individual Time Calculation (min): 75 min    Short Term Goals: Week 1:  OT Short Term Goal 1 (Week 1): Pt will don shirt with mod A sitting EOB OT Short Term Goal 2 (Week 1): Pt will complete sit to stand with mod A OT Short Term Goal 3 (Week 1): Pt will complete toilet transfer with max assist of one caregiver  Skilled Therapeutic Interventions/Progress Updates:    Pt presents supine in bed no c/o pain agreeable to therapy session. Focus of session on self care retraining; Pt declining shower this AM reporting she is too cold, agreeable to washing up at sink. Pt performing supine>sit with modA, squat pivot transfer EOB>w/c with maxA. Pt completing bathing/dressing from w/c level at sink. Pt requiring use of hand over hand assist to wash LUE with RUE, overall requiring modA for doffing/donning new button up shirt. Use of Stedy for sit<>stand at sink for LB ADL, pt able to perform sit<>stand at [facility] from w/c height with modA and maxA for completion of LB dressing, totalA for footwear. Pt returned to w/c for completion of seated grooming ADLs. Additional focus on RUE NMR completing PROM to RUE across all planes while seated and trace activation noted with attempts at shoulder shrug. Pt left seated in w/c end of session with seatbelt alarm donned, call bell and needs within reach.   Therapy Documentation Precautions:  Precautions Precautions: Fall Precaution Comments: R hemi, R inattention Restrictions Weight Bearing Restrictions: No     Therapy/Group: Individual Therapy  Raymondo Band 11/23/2017, 8:59 AM

## 2017-11-23 NOTE — Progress Notes (Signed)
Speech Language Pathology Daily Session Note  Patient Details  Name: DEZERAE FREIBERGER MRN: [id number] Date of Birth: 30-Dec-1945  Today's Date: 11/23/2017 SLP Individual Time: 1100-1130 SLP Individual Time Calculation (min): 30 min  Short Term Goals: Week 2: SLP Short Term Goal 1 (Week 2): Patient will utilize speech intelligibility strategies at the conversation level Mod I in moderate noisy environment.   Skilled Therapeutic Interventions: Skilled treatment session focused on speech goals. Patient performed 25 repetitions of IMST and EMST exercises with Mod I and a self-perceived effort level of 8/10. SLP also facilitated session by providing intermittent supervision verbal cues for patient to self-monitor and correct speech errors at the conversation level in a moderately noisy environment. Patient left upright in wheelchair with all needs within reach. Continue with current plan of care.       Pain No/Denies Pain   Therapy/Group: Individual Therapy  Rye Decoste 11/23/2017, 3:22 PM

## 2017-11-23 NOTE — Progress Notes (Signed)
  Subjective/Complaints: Discussed sleep, pt was not aware that she was taking xnax in hospital  ROS denies CP, SOB, N/V/D  Objective: Vital Signs: Blood pressure 123/77, pulse 88, temperature 97.8 F (36.6 C), temperature source Oral, resp. rate 18, height 5\' 1"  (1.549 m), weight 100 kg, SpO2 91 %. No results found. No results found for this or any previous visit (from the past 72 hour(s)).   Constitutional: No distress . Vital signs reviewed. HENT: Normocephalic.  Atraumatic. Eyes: EOMI. No discharge. Cardiovascular: RRR.  No JVD. Respiratory: CTA bilaterally.  Normal effort. GI: BS +.  Mildly distended, non tender Musc: No edema or tenderness in extremities. Skin:   Intact.  Warm and dry. Neuro: Alert/Oriented Motor: 0/5 in RUE/RLE, stable 4/5 LUE/LLE  Dysarthria   Assessment/Plan: 1. Functional deficits secondary to Right hemiparesis, left subcortical infarct which require 3+ hours per day of interdisciplinary therapy in a comprehensive inpatient rehab setting. Physiatrist is providing close team supervision and 24 hour management of active medical problems listed below. Physiatrist and rehab team continue to assess barriers to discharge/monitor patient progress toward functional and medical goals. FIM:                                  Medical Problem List and Plan: 1.  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  Cont CIR  2.  DVT Prophylaxis/Anticoagulation: Chronic Eliquis has been resumed. 3. Pain Management: Tylenol as needed  Chronic migraines, treated with topamax 100mg  BID as outpt per PCP, restarted at 25mg  BID  no need to increase at this time 4. Mood: Xanax 1 mg nightly 5. Neuropsych: This patient is?  Fully capable of  making decisions on her own behalf. 6. Skin/Wound Care: Routine skin checks 7. 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  Advance diet as tolerated 9.  Hypokalemia.    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  Advance to regular diet tolerating without difficulty.   11.  Diastolic congestive heart failure.  Demadex 20 mg held due to hypokalemia.  Monitor for any signs of fluid overload  Mild pre tib edema R>L no need to restart torsemide 12.  Hypertension.  Monitor with increased mobility Vitals:   11/22/17 2112 11/23/17 0421  BP: 117/76 123/77  Pulse: 64 88  Resp: 18 18  Temp: 99.3 F (37.4 C) 97.8 F (36.6 C)  SpO2: 100% 91%   Controlled 10/8 13.  PAF.  Continue Eliquis.  Cardiac rate controlled 10/8 14.  Hypothyroidism.  Continue Synthroid 15.  CKD stage II.    Creatinine 1.13 on 10/1  Stable 16.  Hyperlipidemia.  Lipitor 17.  Hypoalbuminemia  Supplement initiated  LOS (Days) 8 A FACE TO FACE EVALUATION WAS PERFORMED  Charlett Blake 11/23/2017, 7:45 AM

## 2017-11-23 NOTE — Progress Notes (Signed)
Patient refused CPAP.

## 2017-11-23 NOTE — Progress Notes (Signed)
Physical Therapy Session Note  Patient Details  Name: Holly Flores MRN: [id number] Date of Birth: 1945-12-05  Today's Date: 11/23/2017 PT Individual Time: 1015-1058 PT Individual Time Calculation (min): 43 min   Short Term Goals: Week 1:  PT Short Term Goal 1 (Week 1): Pt will perform bed mobility with mod assist PT Short Term Goal 2 (Week 1): pt will perform bed<>chair transfers with mod assist  PT Short Term Goal 3 (Week 1): Pt will propel w/c x 50 ft with min assist  Skilled Therapeutic Interventions/Progress Updates:   Pt in w/c and agreeable to therapy, denies pain. Total assist w/c transport to/from therapy gym for time management. Worked on gait training and pre-gait tasks at rail this session. Performed multiple sit<>stands at rail w/ min assist and performed forward and backward stepping w/ BLEs to work on R quad activation in SLS and R hip flexor activation in swing limb advancement. Total-max assist for RLE management, tactile cues for R quad activation and min manual assist for lateral weight shifting. Ambulated 15' x2 at rail w/ min assist for upright balance/lateral weight shifting and max assist for RLE management w/ foot placement and knee flexion/extension. 2nd helper for w/c follow. Returned to room via w/c, ended session in w/c and all needs in reach.   Therapy Documentation Precautions:  Precautions Precautions: Fall Precaution Comments: R hemi, R inattention Restrictions Weight Bearing Restrictions: No Pain: Pain Assessment Pain Scale: 0-10 Pain Score: 0-No pain  Therapy/Group: Individual Therapy  Celia Friedland K Sriya Kroeze 11/23/2017, 10:59 AM

## 2017-11-23 NOTE — Plan of Care (Signed)
  Problem: Consults Goal: RH STROKE PATIENT EDUCATION Description See Patient Education module for education specifics  Outcome: Progressing   Problem: RH BOWEL ELIMINATION Goal: RH STG MANAGE BOWEL WITH ASSISTANCE Description STG Manage Bowel with [place].  Outcome: Progressing Goal: RH STG MANAGE BOWEL W/MEDICATION W/ASSISTANCE Description STG Manage Bowel with Medication with min Assistance.  Outcome: Progressing   Problem: RH BLADDER ELIMINATION Goal: RH STG MANAGE BLADDER WITH ASSISTANCE Description STG Manage Bladder With Min Assistance  Outcome: Progressing   Problem: RH PAIN MANAGEMENT Goal: RH STG PAIN MANAGED AT OR BELOW PT'S PAIN GOAL Description <4 out of 10.   Outcome: Progressing

## 2017-11-24 ENCOUNTER — Inpatient Hospital Stay (HOSPITAL_COMMUNITY): Payer: Self-pay

## 2017-11-24 ENCOUNTER — Inpatient Hospital Stay (HOSPITAL_COMMUNITY): Payer: Medicare Other | Admitting: Speech Pathology

## 2017-11-24 ENCOUNTER — Inpatient Hospital Stay (HOSPITAL_COMMUNITY): Payer: Medicare Other | Admitting: Occupational Therapy

## 2017-11-24 NOTE — Progress Notes (Signed)
Speech Language Pathology Discharge Summary  Patient Details  Name: Holly Flores MRN: [id number] Date of Birth: December 27, 1945  Today's Date: 11/24/2017 SLP Individual Time: 1000-1100 SLP Individual Time Calculation (min): 60 min   Skilled Therapeutic Interventions: Skilled treatment session focused on speech goals. SLP facilitated session by taking patient into a moderately noisy environment to focus on intelligibility.  Patient was 100% intelligible at the conversation level with Mod I. SLP also facilitated session by re-administering RMT testing. Patient's peak MIP was 25 cm H2O compared to 5 cm H2O and patient's peak MEP was 70 cm H2O compared to 52 cm H20. Patient's MIP is below the LLN, therefore, recommend patient continue IMST device. Patient's IMST was adjusted to 13 cm H2O. Patient performed 25 repetitions with Mod I and a self-perceived effort level of 7/10.  Patient is at her baseline level of swallowing and speech function, therefore, she will be discharged from skilled SLP intervention. SLP provided education in regards to strategies to utilize at home to maximize intelligibility in a distracting environment. She verbalized understanding. Patient left upright in wheelchair with alarm on and all needs within reach.  Patient has met 4 of 4 long term goals.  Patient to discharge at overall Modified Independent level.   Reasons goals not met: N/A   Clinical Impression/Discharge Summary: Patient has made excellent gains and has met 4 of 4 LTGs this admission. Currently, patient is consuming regular textures with thin liquids without overt s/s of aspiration and is Mod I for use of swallowing compensatory strategies. Patient's speech intelligibility is also at baseline and patient is ~100% intelligible at the conversation level despite a hoarse vocal quality and low vocal intensity. Patient is at her speech and swallowing baseline, therefore, patient will be discharged from SLP intervention.  Patient verbalized understanding and agreement.    Recommendation:  None      Equipment: IMST and EMST devices    Reasons for discharge: Treatment goals met   Patient/Family Agrees with Progress Made and Goals Achieved: Yes     Raun Routh, [place] 11/24/2017, 3:26 PM

## 2017-11-24 NOTE — Progress Notes (Signed)
Patient refuses CPAP 

## 2017-11-24 NOTE — Progress Notes (Signed)
Occupational Therapy Weekly Progress Note  Patient Details  Name: Holly Flores MRN: [id number] Date of Birth: November 30, 1945  Beginning of progress report period: November 16, 2017 End of progress report period: November 24, 2017  Today's Date: 11/24/2017 OT Individual Time: 0801-0900 OT Individual Time Calculation (min): 59 min    Patient has met 1 of 3 short term goals.  Pt is making steady progress towards additional STGs. Pt understands use of hemi-technique for completing UB dressing, though continues to require assist for successful completion, partly due to body habitus. Pt has consistently demonstrated sit<>stand transfers with modA using UE support, and is making steady progress towards stand/squat pivot transfers. Pt also demonstrating increased awareness to R side of body during functional tasks, only requiring intermittent cues to do so.   Patient continues to demonstrate the following deficits: muscle weakness, decreased attention to right, decreased awareness and decreased sitting balance, decreased standing balance, decreased postural control, hemiplegia and decreased balance strategies and therefore will continue to benefit from skilled OT intervention to enhance overall performance with BADL and Reduce care partner burden.  Patient progressing toward long term goals..  Continue plan of care.  OT Short Term Goals Week 1:  OT Short Term Goal 1 (Week 1): Pt will don shirt with mod A sitting EOB OT Short Term Goal 1 - Progress (Week 1): Progressing toward goal OT Short Term Goal 2 (Week 1): Pt will complete sit to stand with mod A OT Short Term Goal 2 - Progress (Week 1): Met OT Short Term Goal 3 (Week 1): Pt will complete toilet transfer with max assist of one caregiver OT Short Term Goal 3 - Progress (Week 1): Progressing toward goal Week 2:  OT Short Term Goal 1 (Week 2): Pt will don shirt with mod A sitting EOB OT Short Term Goal 2 (Week 2): Pt will complete toilet transfer  with mod assist of one caregiver OT Short Term Goal 3 (Week 2): Pt will don pants with modA.   Skilled Therapeutic Interventions/Progress Updates:    Pt presents supine in bed agreeable to therapy session. Focus of session on ADL retraining, completing bathing at shower level this session. Pt performing supine>sitting EOB with light modA for RLE and trunk elevation. Use of Stedy for transfer to TTB with modA for sit<>stand at [facility] from EOB. Pt completing bathing with overall modA and use of hand over hand assist to facilitate RUE into task completion and use of lateral leans for washing buttocks. Returned to EOB for dressing completion. Pt requesting to attempt donning shirt using overhead donning vs method of using button up shirt. Reviewed hemi-technique during task completion with pt requiring overall maxA to complete. Pt requiring maxA for LB dressing using Stedy to perform sit<>stand and for standing balance, totalA for footwear. Discussed use of reacher and LH sponge for bathing/dressing ADLs as pt reports she uses a reacher at home, will plan to trial AE during further treatment sessions. Transitioned to w/c for completing of grooming ADLs in sitting. Pt left seated in w/c end of session with seatbelt alarm set, call bell and needs within reach.   Therapy Documentation Precautions:  Precautions Precautions: Fall Precaution Comments: R hemi, R inattention Restrictions Weight Bearing Restrictions: No        Therapy/Group: Individual Therapy  Raymondo Band 11/24/2017, 7:57 AM

## 2017-11-24 NOTE — Progress Notes (Signed)
  Subjective/Complaints: Slept better last noc  ROS denies CP, SOB, N/V/D  Objective: Vital Signs: Blood pressure 119/66, pulse 88, temperature 98.3 F (36.8 C), temperature source Oral, resp. rate 18, height '5\' 1"'$  (1.549 m), weight 100.1 kg, SpO2 97 %. No results found. No results found for this or any previous visit (from the past 72 hour(s)).   Constitutional: No distress . Vital signs reviewed. HENT: Normocephalic.  Atraumatic. Eyes: EOMI. No discharge. Cardiovascular: RRR.  No JVD. Respiratory: CTA bilaterally.  Normal effort. GI: BS +.  Mildly distended, non tender Musc: No edema or tenderness in extremities. Skin:   Intact.  Warm and dry. Neuro: Alert/Oriented Motor: 0/5 in RUE/RLE, stable 4/5 LUE/LLE  Dysarthria   Assessment/Plan: 1. Functional deficits secondary to Right hemiparesis, left subcortical infarct which require 3+ hours per day of interdisciplinary therapy in a comprehensive inpatient rehab setting. Physiatrist is providing close team supervision and 24 hour management of active medical problems listed below. Physiatrist and rehab team continue to assess barriers to discharge/monitor patient progress toward functional and medical goals. FIM:                                  Medical Problem List and Plan: 1.  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  Cont CIR Team conference today please see physician documentation under team conference tab, met with team face-to-face to discuss problems,progress, and goals. Formulized individual treatment plan based on medical history, underlying problem and comorbidities. 2.  DVT Prophylaxis/Anticoagulation: Chronic Eliquis has been resumed. 3. Pain Management: Tylenol as  needed  Chronic migraines, treated with topamax '100mg'$  BID as outpt per PCP, restarted at '25mg'$  BID   4. Mood: Xanax 1 mg nightly 5. Neuropsych: This patient is?  Fully capable of making decisions on her own behalf. 6. Skin/Wound Care: Routine skin checks 7. 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  Advance diet as tolerated 9.  Hypokalemia.    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  Advance to regular diet tolerating without difficulty.   11.  Diastolic congestive heart failure.  Demadex 20 mg held due to hypokalemia.  Monitor for any signs of fluid overload  Mild pre tib edema R>L no need to restart torsemide 12.  Hypertension.  Monitor with increased mobility Vitals:   11/23/17 1950 11/24/17 0512  BP: 107/70 119/66  Pulse: 70 88  Resp: 18 18  Temp: 98.2 F (36.8 C) 98.3 F (36.8 C)  SpO2: 94% 97%   Controlled 10/9 13.  PAF.  Continue Eliquis.  Cardiac rate controlled 10/9 14.  Hypothyroidism.  Continue Synthroid 15.  CKD stage II.    Creatinine 1.13 on 10/1  Stable 16.  Hyperlipidemia.  Lipitor 17.  Hypoalbuminemia  Supplement initiated  LOS (Days) 9 A FACE TO FACE EVALUATION WAS PERFORMED  Charlett Blake 11/24/2017, 10:01 AM

## 2017-11-24 NOTE — Progress Notes (Signed)
Physical Therapy Weekly Progress Note  Patient Details  Name: Holly Flores MRN: [id number] Date of Birth: 1945/05/24  Beginning of progress report period: November 16, 2017 End of progress report period: November 24, 2017  Today's Date: 11/24/2017 PT Individual Time: 1130-1200 and 1330-1430 PT Individual Time Calculation (min): 30 min  And 60 min   Patient has met 3 of 3 short term goals.  Pt making progress towards long term goals, continues to be limited by impaired strength in R UE/LE. Pt has minimal R hip flexor and quadriceps activation limiting ability to functionally transfer and ambulate.   Patient continues to demonstrate the following deficits muscle weakness, decreased cardiorespiratoy endurance, decreased coordination and decreased motor planning and decreased standing balance, decreased postural control, hemiplegia and decreased balance strategies and therefore will continue to benefit from skilled PT intervention to increase functional independence with mobility.  Patient progressing toward long term goals..  Continue plan of care.  PT Short Term Goals Week 1:  PT Short Term Goal 1 (Week 1): Pt will perform bed mobility with mod assist PT Short Term Goal 1 - Progress (Week 1): Met PT Short Term Goal 2 (Week 1): pt will perform bed<>chair transfers with mod assist  PT Short Term Goal 2 - Progress (Week 1): Met PT Short Term Goal 3 (Week 1): Pt will propel w/c x 50 ft with min assist PT Short Term Goal 3 - Progress (Week 1): Met Week 2:  PT Short Term Goal 1 (Week 2): Pt will ambulate x30 ft with Max assist +1 and LRAD  PT Short Term Goal 2 (Week 2): Pt will perform sit<>stand with min assist consistently PT Short Term Goal 3 (Week 2): Pt will initiate stair training  Skilled Therapeutic Interventions/Progress Updates:    Session 1: Pt seated in w/c upon PT arrival, agreeable to therapy tx and denies pain. Pt transported to the gym. Pt performed multiple sit<>stands this  session at rail with min-mod assist. In standing pt worked on static standing balance without UE support, pre-gait stepping in place with L LE and UE support on L rail, mini squats x 5. Pt performed 2 x 10 partial sit<>stands from w/c without UE support max assist working on pushing through LEs for decreased reliance on UEs.. Pt transported back to room and left seated in w/c with set up for lunch.   Session 2: Pt seated in w/c upon PT arrival, agreeable to therapy tx and denies pain. Pt transported from room>gym. Pt performed sit<>stands at the rail this session with mod assist. Pt ambulated x 20 ft using L handrail with mod assist +2 for w/c follow, pt able to advance R LE with just sock and DF ace wrap on foot and therapist assisting to place foot, therapist blocking R knee during stance. Therapist donned R AFO with shoecover and added heel wedge to L shoe to assist with R foot clearance, pt worked on pre-gait stepping in place with each LE. Pt very eager and asking about walking with RW. Pt performed sit<>stands with RW and mod-max assist this session, R hand orthosis. Pt with active shoulder elevation and flexion when sitting. In standing with RW pt able to slide RW forward and backward in place with emphasis on using R UE. In standing with RW pt worked on pre-gait stepping forward/back in place with L LE while therapist blocked R LE and providing facilitation for lateral weightshifting. Pt ambulated x 5 ft with RW, R hand orthosis, R AFO and max  assist +2, therapist blocking R LE during stance and providing facilitation for R hip extension and weightshifting, second therapist helping to place R LE during swing. Pt transported back to room at end of session and left seated in w/c with needs in reach.   Therapy Documentation Precautions:  Precautions Precautions: Fall Precaution Comments: R hemi, R inattention Restrictions Weight Bearing Restrictions: No   Therapy/Group: Individual Therapy  Netta Corrigan, PT, DPT 11/24/2017, 7:56 AM

## 2017-11-25 ENCOUNTER — Inpatient Hospital Stay (HOSPITAL_COMMUNITY): Payer: Medicare Other | Admitting: Physical Therapy

## 2017-11-25 ENCOUNTER — Inpatient Hospital Stay (HOSPITAL_COMMUNITY): Payer: Medicare Other | Admitting: Speech Pathology

## 2017-11-25 ENCOUNTER — Inpatient Hospital Stay (HOSPITAL_COMMUNITY): Payer: Self-pay | Admitting: Physical Therapy

## 2017-11-25 ENCOUNTER — Inpatient Hospital Stay (HOSPITAL_COMMUNITY): Payer: Medicare Other | Admitting: Occupational Therapy

## 2017-11-25 MED ORDER — MUSCLE RUB 10-15 % EX CREA
TOPICAL_CREAM | Freq: Two times a day (BID) | CUTANEOUS | Status: DC
  Administered 2017-11-25 – 2017-11-27 (×2): via TOPICAL
  Filled 2017-11-25: qty 85

## 2017-11-25 MED ORDER — TROLAMINE SALICYLATE 10 % EX CREA: TOPICAL_CREAM | Freq: Two times a day (BID) | CUTANEOUS | Status: DC

## 2017-11-25 MED ORDER — BENEPROTEIN PO POWD
1.0000 | Freq: Three times a day (TID) | ORAL | Status: DC
  Administered 2017-11-25 – 2017-12-07 (×34): 6 g via ORAL
  Filled 2017-11-25 (×2): qty 227

## 2017-11-25 MED ORDER — PRO-STAT SUGAR FREE PO LIQD
30.0000 mL | Freq: Two times a day (BID) | ORAL | Status: DC
  Administered 2017-11-25 – 2017-11-26 (×2): 30 mL via ORAL
  Filled 2017-11-25 (×7): qty 30

## 2017-11-25 NOTE — Progress Notes (Signed)
Physical Therapy Session Note  Patient Details  Name: Holly Flores MRN: [id number] Date of Birth: [birth year]  Today's Date: 11/25/2017 PT Individual Time: 1130-1158 PT Individual Time Calculation (min): 28 min   Short Term Goals: Week 2:  PT Short Term Goal 1 (Week 2): Pt will ambulate x30 ft with Max assist +1 and LRAD  PT Short Term Goal 2 (Week 2): Pt will perform sit<>stand with min assist consistently PT Short Term Goal 3 (Week 2): Pt will initiate stair training  Skilled Therapeutic Interventions/Progress Updates:    Patient received up in [facility], pleasant and willing to participate in skilled therapy session. Able to self-propel WC approximately 13f and limited by fatigue, [facility]. Focused on NMR based activities in standing with RW, ModA for sit to stand and Mod cues/Mod-Max facilitation for proper weight shifting standing with RW. Patient with difficulty shifting weight over R LE and required heavy facilitation to prevent knee hyperextension today. [facility], all needs otherwise met this morning.   Therapy Documentation Precautions:  Precautions Precautions: Fall Precaution Comments: R hemi, R inattention Restrictions Weight Bearing Restrictions: No General:   Vital Signs:   Pain: Pain Assessment Pain Scale: 0-10 Pain Score: 0-No pain    Therapy/Group: Individual Therapy  KDeniece ReePT, DPT, CBIS  Supplemental [facility] Therapist [facility]   Pager [phone] Rehab Office [phone]  11/25/2017, 12:34 PM

## 2017-11-25 NOTE — Progress Notes (Signed)
  Subjective/Complaints:  No issues overnite  ROS denies CP, SOB, N/V/D  Objective: Vital Signs: Blood pressure 108/70, pulse 79, temperature 98.2 F (36.8 C), temperature source Oral, resp. rate 18, height 5\' 1"  (1.549 m), weight 100.1 kg, SpO2 91 %. No results found. No results found for this or any previous visit (from the past 72 hour(s)).   Constitutional: No distress . Vital signs reviewed. HENT: Normocephalic.  Atraumatic. Eyes: EOMI. No discharge. Cardiovascular: RRR.  No JVD. Respiratory: CTA bilaterally.  Normal effort. GI: BS +.  Mildly distended, non tender Musc: No edema or tenderness in extremities. Skin:   Intact.  Warm and dry. Neuro: Alert/Oriented Motor: 0/5 in RUE/RLE, stable 4/5 LUE/LLE  Dysarthria   Assessment/Plan: 1. Functional deficits secondary to Right hemiparesis, left subcortical infarct which require 3+ hours per day of interdisciplinary therapy in a comprehensive inpatient rehab setting. Physiatrist is providing close team supervision and 24 hour management of active medical problems listed below. Physiatrist and rehab team continue to assess barriers to discharge/monitor patient progress toward functional and medical goals. FIM:                                  Medical Problem List and Plan: 1.  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  CIR PT, OT, SLP 2.  DVT Prophylaxis/Anticoagulation: Chronic Eliquis has been resumed. 3. Pain Management: Tylenol as needed  Chronic migraines, treated with topamax 100mg  BID as outpt per PCP, restarted at 25mg  BID  No HA c/os 4. Mood: Xanax 1 mg nightly 5. Neuropsych: This patient is?  Fully capable of making decisions on her own behalf. 6. Skin/Wound Care: Routine  skin checks 7. 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  Advance diet as tolerated 9.  Hypokalemia.    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  Advance to regular diet tolerating without difficulty.   11.  Diastolic congestive heart failure.  Demadex 20 mg held due to hypokalemia.  Monitor for any signs of fluid overload  Mild pre tib edema R>L no need to restart torsemide 12.  Hypertension.  Monitor with increased mobility Vitals:   11/24/17 2016 11/25/17 0546  BP: 135/64 108/70  Pulse: 66 79  Resp: 16 18  Temp: 98.2 F (36.8 C) 98.2 F (36.8 C)  SpO2: 100% 91%   Controlled 10/9 13.  PAF.  Continue Eliquis.  Cardiac rate controlled 10/9 14.  Hypothyroidism.  Continue Synthroid 15.  CKD stage II.    Creatinine 1.13 on 10/1  Stable 16.  Hyperlipidemia.  Lipitor 17.  Hypoalbuminemia  Supplement initiated  LOS (Days) 10 A FACE TO FACE EVALUATION WAS PERFORMED  Charlett Blake 11/25/2017, 8:53 AM

## 2017-11-25 NOTE — Progress Notes (Signed)
Pt. Refused cpap. 

## 2017-11-25 NOTE — Progress Notes (Signed)
Physical Therapy Session Note  Patient Details  Name: Holly Flores MRN: [id number] Date of Birth: August 22, 1945  Today's Date: 11/25/2017 PT Individual Time: 1345-1440 AND 1530-1610 PT Individual Time Calculation (min): 55 min AND 40 min  Short Term Goals: Week 2:  PT Short Term Goal 1 (Week 2): Pt will ambulate x30 ft with Max assist +1 and LRAD  PT Short Term Goal 2 (Week 2): Pt will perform sit<>stand with min assist consistently PT Short Term Goal 3 (Week 2): Pt will initiate stair training  Skilled Therapeutic Interventions/Progress Updates:   Session 1:  Pt in w/c and agreeable to therapy, denies pain. Total assist w/c transport to/from therapy gym for energy conservation. Worked on standing and pre-gait tasks with RW and R ground-reaction AFO. Mod-max assist to stand to RW and min assist to maintain midline control during tasks. Performed L forward and backward stepping to work on R SLS and quad activation, max assist to block knee still. Worked on R hip flexion w/ R foot forward and backward stepping, max assist needed to facilitate. Worked on blocked practice of squat pivot/lateral scoot transfers in both directions w/ emphasis on clearing bottom from chair/mat. Performed lateral scooting in both directions in between bouts as well. Manual, tactile, and verbal cues for head/hips relationship, technique, and safety. Mod-max assist transfer towards L side, max assist towards R side. Pt self-propelled w/c back towards room, max assist overall via L hemi technique w/ verbal and tactile cues for technique. Ended session in w/c, all needs in reach.   Session 2:  Pt in w/c and agreeable to therapy, denies pain. Total assist w/c transport to/from therapy gym. Squat/stand pivot transfer to/from NuStep w/ max assist. Performed NuStep 5 min @ level 1 w/ BLEs and LUE for LE strengthening and muscle activation. Performed another 5 min w/o UE assist. Pt also able to maintain neutral RLE alignment  w/o manual assist, much improved from previous sessions with this therapist. Returned to room and ended session in w/c, all needs in reach. Provided pt w/ educational handout w/ stroke risk factors and stroke prevention. Briefly reviewed her risk of having another stroke having already had one. Pt appreciative of education materials.   Therapy Documentation Precautions:  Precautions Precautions: Fall Precaution Comments: R hemi, R inattention Restrictions Weight Bearing Restrictions: No Pain: Pain Assessment Pain Scale: 0-10 Pain Score: 0-No pain  Therapy/Group: Individual Therapy  Audreena Sachdeva Clent Demark 11/25/2017, 2:43 PM

## 2017-11-25 NOTE — Patient Care Conference (Signed)
Inpatient RehabilitationTeam Conference and Plan of Care Update Date: 11/24/2017   Time: 11:00 AM    Patient Name: Holly Flores      Medical Record Number: [id number]  Date of Birth: [birth year] Sex: Female         Room/Bed: 4W09C/4W09C-01 Payor Info: Payor: MEDICARE / Plan: MEDICARE PART A AND B / Product Type: *No Product type* /    Admitting Diagnosis: CVA  Admit Date/Time:  11/15/2017  5:26 PM Admission Comments: No comment available   Primary Diagnosis:  <principal problem not specified> Principal Problem: <principal problem not specified>  Patient Active Problem List   Diagnosis Date Noted  . Hypoalbuminemia due to protein-calorie malnutrition ([place])   . Hypokalemia   . Intractable migraine without status migrainosus   . Reactive depression   . Abdominal distention   . Ileus ([place])   . 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 Hemiparesis affecting right side as late effect of stroke ([place])   . Abdominal distension   . Acute kidney injury superimposed on chronic kidney disease ([place])   . 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 Acute CVA (cerebrovascular accident) ([place])   . Slow transit constipation   . CKD (chronic kidney disease), stage II   . PAF (paroxysmal atrial fibrillation) ([place])   . Dysphasia, post-stroke   . Right flaccid hemiplegia ([place])   . COPD (chronic obstructive pulmonary disease) ([place]) 10/29/2017  . Chronic diastolic CHF (congestive heart failure) ([place]) 10/29/2017  . Hypothyroidism 10/29/2017  . Essential hypertension 10/29/2017  . GERD (gastroesophageal reflux disease) 10/29/2017  . Anxiety 10/29/2017  . Taking medication for chronic disease 05/18/2017  . Central retinal vein occlusion of left eye 01/22/2015  . Degenerative disc disease, cervical 01/22/2015  . HA (headache) 12/30/2014  . CVA (cerebral vascular accident) ([place]) 12/30/2014  . CVA (cerebral infarction) 12/30/2014  . History of colonic polyps 05/14/2014  . DJD (degenerative joint  disease) of knee 05/24/2013  . Chest pain 04/20/2013  . Dyspnea 04/20/2013  . Hyperlipidemia 04/20/2013    Expected Discharge Date: Expected Discharge Date: 12/08/17  Team Members Present: Physician leading conference: Dr. Alysia Penna Social Worker Present: Alfonse Alpers, LCSW Nurse Present: Rayetta Pigg, RN PT Present: Michaelene Song, PT OT Present: Willeen Cass, OT SLP Present: Weston Anna, SLP PPS Coordinator present : Daiva Nakayama, RN, CRRN     Current Status/Progress Goal Weekly Team Focus  Medical   No longer requiring IV fluids.    Maintain medical stability adequate fluid and caloric intake, incontinence of bowel bladder  Improved bowel pattern   Bowel/Bladder   Continent of bowel and bladder; LBM 11/23/17  Remain continent  Assess bowel and bladder needs q shift and PRN   Swallow/Nutrition/ Hydration   Regular textures with thin liquids, Mod I   Mod I with least restrictive diet  Goal Met, D/C from SLP   ADL's   mod-maxA transfers; modA bathing; MaxA UB dress, maxA LB dress, totalA footwear  contact guard A overall with transfers, ADLs  RUE NMR, NMR with Estim, ADL retraining, functional transfers, NDT, pt/family education    Mobility   mod assist bed mobility and sit<>stand, max assist gait 10 ft at rail, min assist w/c propulsion  min assist transfers, supervision w/c mobility, mod assist gait goal with PT only  R NMR standing balance, transfers, R attention, strength, bed mobility   Communication   Mod I  Mod I   Goal met, D/C from SLP    Safety/Cognition/ Behavioral  Observations            Pain   No c/o pain   Pain </= 2/10  Assess pain q shift and PRN and medicate as necessary   Skin   No skin issues  Remain free of infection and breakdown  Assess skin q shift and PRN and address any areas of concern    Rehab Goals Patient on target to meet rehab goals: Yes Rehab Goals Revised: none *See Care Plan and progress notes for long and short-term  goals.     Barriers to Discharge  Current Status/Progress Possible Resolutions Date Resolved   Physician    Medical stability     Progressing with rehab  Continue current program      Nursing                  PT  Inaccessible home environment;Decreased caregiver support;Home environment access/layout;Weight                 OT                  SLP                SW                Discharge Planning/Teaching Needs:  Pt will return to her home where her dtr and other family members will assist with pt's care.  Husband is dealing with his own medical issues presently.  Family education closer to d/c.   Team Discussion:  Pt still with gas and loose stools, so MD to d/c [place].  She does not have any abdominal pain and is continent of bladder.  Pt is on reg textures and thin liquids and she reports her voice sounds back to normal to her, so ST to d/c to allow pt more time for PT and OT.  Pt is still not getting a lot of return in her right arm and leg and is mod to max A for gait, 15' at the rail.  AFO to be ordered.  Pt is using stedy in bathroom and is max A for LB and total A to put shoes on.  Needs hand over hand A to use right hand.  Revisions to Treatment Plan:  none    Continued Need for Acute Rehabilitation Level of Care: The patient requires daily medical management by a physician with specialized training in physical medicine and rehabilitation for the following conditions: Daily direction of a multidisciplinary physical rehabilitation program to ensure safe treatment while eliciting the highest outcome that is of practical value to the patient.: Yes Daily medical management of patient stability for increased activity during participation in an intensive rehabilitation regime.: Yes Daily analysis of laboratory values and/or radiology reports with any subsequent need for medication adjustment of medical intervention for : Neurological problems   I attest that I was present, lead  the team conference, and concur with the assessment and plan of the team.   Jadia Capers, Silvestre Mesi 11/25/2017, 2:19 PM

## 2017-11-25 NOTE — Progress Notes (Signed)
Occupational Therapy Session Note  Patient Details  Name: Holly Flores MRN: [id number] Date of Birth: 1945/11/23  Today's Date: 11/25/2017 OT Individual Time: 1000-1100 OT Individual Time Calculation (min): 60 min    Short Term Goals: Week 1:  OT Short Term Goal 1 (Week 1): Pt will don shirt with mod A sitting EOB OT Short Term Goal 1 - Progress (Week 1): Progressing toward goal OT Short Term Goal 2 (Week 1): Pt will complete sit to stand with mod A OT Short Term Goal 2 - Progress (Week 1): Met OT Short Term Goal 3 (Week 1): Pt will complete toilet transfer with max assist of one caregiver OT Short Term Goal 3 - Progress (Week 1): Progressing toward goal Week 2:  OT Short Term Goal 1 (Week 2): Pt will don shirt with mod A sitting EOB OT Short Term Goal 2 (Week 2): Pt will complete toilet transfer with mod assist of one caregiver OT Short Term Goal 3 (Week 2): Pt will don pants with modA.   Skilled Therapeutic Interventions/Progress Updates:    Pt received in bed stating she would like to just wash up, not shower. Pt worked on bed mobility with managing RUE with mod A.   She sat at EOB to wash and dress UB with mod A.  To transfer to w/c, pt worked on scoot pivots to the R with max A.  Pt continues to have great difficulty with forward lean to lift hips for a sufficient weight shift and swivel.  Her w/c was sliding so had nurse tech assist with holding chair in place for pt to squat pivot to the R.   Once in chair she worked on sit to stands with L bed rail, held balance with mod A so therapist could assist with bathing bottom and pulling up pants.   Estim applied for 10 minutes to wrist/finger extensors at 35 intensity.  Pt focused on movement and attempted to help move fingers further.  Pt has increased edema to R hand.  Worked on retrograde massage and positioning.  Pt resting in w/c with all needs met.   Therapy Documentation Precautions:  Precautions Precautions: Fall Precaution  Comments: R hemi, R inattention Restrictions Weight Bearing Restrictions: No      Pain: Pain Assessment Pain Scale: 0-10 Pain Score: 0-No pain ADL: ADL Eating: Set up Where Assessed-Eating: Wheelchair Grooming: Setup Where Assessed-Grooming: Wheelchair Upper Body Bathing: Moderate assistance, Minimal cueing Where Assessed-Upper Body Bathing: Edge of bed Lower Body Bathing: Moderate assistance, Moderate cueing Where Assessed-Lower Body Bathing: Wheelchair Upper Body Dressing: Maximal assistance, Moderate cueing Where Assessed-Upper Body Dressing: Edge of bed Lower Body Dressing: Maximal assistance Where Assessed-Lower Body Dressing: Edge of bed Toileting: Maximal assistance Where Assessed-Toileting: Bedside Commode Toilet Transfer: Dependent, Moderate verbal cueing Toilet Transfer Method: Other (comment)(stedy) Science writer: Bedside commode Tub/Shower Transfer: Unable to assess Tub/Shower Transfer Method: Unable to assess [employer] Transfer: Unable to assess [employer] Method: Unable to assess  Therapy/Group: Individual Therapy  Jakeria Caissie 11/25/2017, 12:24 PM

## 2017-11-25 NOTE — Progress Notes (Signed)
Social Work Patient ID: Holly Flores, female   DOB: December 06, 1945, 72 y.o.   MRN: [id number]   CSW met with pt to update her on team conference discussion and that she is still on track for 12-08-17 d/c.  Pt is seeing little progress, but continues to be frustrated with her right arm.  She stated that her granddtr is getting pt's house ready for her return and having a ramp built.  Granddtr and dtr to be with pt at d/c and son will be with husband as he starts to attend his treatment appts on 12-05-17.  Pt remains positive and motivated.  She plans to update her family and did not want CSW to call them.  CSW will continue to follow and assist as needed.

## 2017-11-26 ENCOUNTER — Inpatient Hospital Stay (HOSPITAL_COMMUNITY): Payer: Medicare Other | Admitting: Occupational Therapy

## 2017-11-26 ENCOUNTER — Inpatient Hospital Stay (HOSPITAL_COMMUNITY): Payer: Medicare Other | Admitting: Physical Therapy

## 2017-11-26 NOTE — Progress Notes (Signed)
Occupational Therapy Session Note  Patient Details  Name: Holly Flores MRN: [id number] Date of Birth: [birth year]  Today's Date: 11/26/2017 OT Individual Time: 6237-6283 OT Individual Time Calculation (min): 76 min    Short Term Goals: Week 2:  OT Short Term Goal 1 (Week 2): Pt will don shirt with mod A sitting EOB OT Short Term Goal 2 (Week 2): Pt will complete toilet transfer with mod assist of one caregiver OT Short Term Goal 3 (Week 2): Pt will don pants with modA.   Skilled Therapeutic Interventions/Progress Updates:    Pt presents supine in bed, pleasant and agreeable to therapy this session, requesting to shower. Pt reports pain in L side, reports MD aware and RN made aware during session. Pt transfers supine>sitting EOB with light modA this session, improvements noted from previous sessions. Use of Stedy for transfer to TTB in shower with pt initially performing sit<>stand from bed level with minA. Remaining sit<>stands requiring modA during session. Issued LH sponge and utilized during bathing task this session for pt to access lower portion of LEs and back. Use of hand over hand assist to wash LUE and CGA provided during additional portions of bathing task. Transitioned to EOB for dressing. Pt taking seated rest break EOB while RN administers AM medications. Pt requiring occasional minA for sitting balance without UE support when taking medications. Pt also with episode of emesis after taking meds, RN present and aware. Pt reports feeling better after episode and wishing to continue with therapy session. Pt continues to require maxA for UB dressing to facilitate successful use of hemi technique. Trialed use of reacher for LB clothing with pt requiring maxA to complete. Pt practiced standing without UE support at [facility] with therapist providing mod-maxA for standing balance while pt initiates advancing pants over hips, therapist assisting with remaining portion. Transitioned to w/c where  pt completed grooming ADL with setup assist. Of note pt also reporting feeling depressed regarding current status, provided emotional support and therapeutic use of self during this time. Pt left seated in w/c end of session with chair alarm set, call bell and needs within reach.   Therapy Documentation Precautions:  Precautions Precautions: Fall Precaution Comments: R hemi, R inattention Restrictions Weight Bearing Restrictions: No     Therapy/Group: Individual Therapy  Raymondo Band 11/26/2017, 7:43 AM

## 2017-11-26 NOTE — Progress Notes (Signed)
  Subjective/Complaints:  Denies any abdominal pain.  Daughter at bedside.  Fluid intake reviewed 800 to 900 cc/day  ROS denies CP, SOB, N/V/D  Objective: Vital Signs: Blood pressure 128/76, pulse 85, temperature 98.5 F (36.9 C), temperature source Oral, resp. rate 18, height 5\' 1"  (1.549 m), weight 100.1 kg, SpO2 95 %. No results found. No results found for this or any previous visit (from the past 72 hour(s)).   Constitutional: No distress . Vital signs reviewed. HENT: Normocephalic.  Atraumatic. Eyes: EOMI. No discharge. Cardiovascular: RRR.  No JVD. Respiratory: CTA bilaterally.  Normal effort. GI: BS +.  Mildly distended, non tender Musc: No edema or tenderness in extremities. Skin:   Intact.  Warm and dry. Neuro: Alert/Oriented Motor: 0/5 in RUE/RLE, stable 4/5 LUE/LLE  Dysarthria   Assessment/Plan: 1. Functional deficits secondary to Right hemiparesis, left subcortical infarct which require 3+ hours per day of interdisciplinary therapy in a comprehensive inpatient rehab setting. Physiatrist is providing close team supervision and 24 hour management of active medical problems listed below. Physiatrist and rehab team continue to assess barriers to discharge/monitor patient progress toward functional and medical goals. FIM:                                  Medical Problem List and Plan: 1.  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  CIR PT, OT, SLP 2.  DVT Prophylaxis/Anticoagulation: Chronic Eliquis has been resumed. 3. Pain Management: Tylenol as needed  Chronic migraines, treated with topamax 100mg  BID as outpt per PCP, restarted at 25mg  BID Improved 4. Mood: Xanax 1 mg nightly 5. Neuropsych: This patient is?  Fully capable  of making decisions on her own behalf. 6. Skin/Wound Care: Routine skin checks 7. 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  Advance diet as tolerated 9.  Hypokalemia.    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  Advance to regular diet tolerating without difficulty.   11.  Diastolic congestive heart failure.  Demadex 20 mg held due to hypokalemia.  Monitor for any signs of fluid overload  Mild pre tib edema R>L no need to restart torsemide 12.  Hypertension.  Monitor with increased mobility Vitals:   11/25/17 2212 11/26/17 0657  BP: (!) 146/76 128/76  Pulse: 72 85  Resp: 18   Temp: 99 F (37.2 C) 98.5 F (36.9 C)  SpO2: 96% 95%   Controlled 10/1          13.  PAF.  Continue Eliquis.  Cardiac rate controlled 10/11 14.  Hypothyroidism.  Continue Synthroid 15.  CKD stage II.    Creatinine 1.13 on 10/1  Stable 16.  Hyperlipidemia.  Lipitor 17.  Hypoalbuminemia  Supplement initiated  LOS (Days) 11 A FACE TO FACE EVALUATION WAS PERFORMED  Charlett Blake 11/26/2017, 8:53 AM

## 2017-11-26 NOTE — Progress Notes (Signed)
Physical Therapy Session Note  Patient Details  Name: Holly Flores MRN: [id number] Date of Birth: 1946/02/14  Today's Date: 11/26/2017 PT Individual Time: 1340-1500 PT Individual Time Calculation (min): 80 min   Short Term Goals: Week 2:  PT Short Term Goal 1 (Week 2): Pt will ambulate x30 ft with Max assist +1 and LRAD  PT Short Term Goal 2 (Week 2): Pt will perform sit<>stand with min assist consistently PT Short Term Goal 3 (Week 2): Pt will initiate stair training  Skilled Therapeutic Interventions/Progress Updates:   Pt in w/c and agreeable to therapy, no c/o pain. Session focused on RLE NMR and gait training. Total assist w/c transport to/from therapy gym for energy conservation. Instructed pt on performing slideboard transfer to work on facilitating more lateral scooting for increased independence w/ transfers at this time. Performed x2 towards R side w/ min-mod assist to scoot and to facilitate head/hips relationship. Pt w/ improved head/hips relationship this session. Ambulated at rail in hallway 20' and 10' w/ max assist for RLE management, to facilitate L lateral weight shifting, and R hip extension in single limb stance. Max manual assist to block R knee and mod assist to facilitate step placement and hip flexion w/ DF ACE wrap assist. Worked on pre-gait at rail including kicking RLE to visual target and R stepping. Transitioned to supine to work on RLE muscle activation in gravity-minimized position. Trace R hip flexion and extension activation w/ knee-to-chest and trace R quad in sidelying w/ powder board. Performed Kinetron @ 50 cm/sec, 30 sec bouts x5 reps. No manual assist needed to maintain neutral RLE alignment and even and reciprocal pattern. Returned to room and ended session in w/c, all needs in reach.  Therapy Documentation Precautions:  Precautions Precautions: Fall Precaution Comments: R hemi, R inattention Restrictions Weight Bearing Restrictions: No Vital  Signs: Therapy Vitals Temp: 98.4 F (36.9 C) Temp Source: Oral Pulse Rate: 65 Resp: 17 BP: 129/63 Patient Position (if appropriate): Lying Oxygen Therapy SpO2: 99 %  Therapy/Group: Individual Therapy  Rollande Thursby K Candler Ginsberg 11/26/2017, 8:01 PM

## 2017-11-26 NOTE — Plan of Care (Signed)
  Problem: Consults Goal: RH STROKE PATIENT EDUCATION Description See Patient Education module for education specifics  Outcome: Progressing   Problem: RH BOWEL ELIMINATION Goal: RH STG MANAGE BOWEL WITH ASSISTANCE Description STG Manage Bowel with [place].  Outcome: Progressing Goal: RH STG MANAGE BOWEL W/MEDICATION W/ASSISTANCE Description STG Manage Bowel with Medication with min Assistance.  Outcome: Progressing   Problem: RH BLADDER ELIMINATION Goal: RH STG MANAGE BLADDER WITH ASSISTANCE Description STG Manage Bladder With Min Assistance  Outcome: Progressing   Problem: RH PAIN MANAGEMENT Goal: RH STG PAIN MANAGED AT OR BELOW PT'S PAIN GOAL Description <4 out of 10.   Outcome: Progressing

## 2017-11-26 NOTE — Progress Notes (Signed)
Physical Therapy Session Note  Patient Details  Name: Holly Flores MRN: [id number] Date of Birth: [birth year]  Today's Date: 11/26/2017 PT Individual Time: 1100-1128 PT Individual Time Calculation (min): 28 min   Short Term Goals: Week 2:  PT Short Term Goal 1 (Week 2): Pt will ambulate x30 ft with Max assist +1 and LRAD  PT Short Term Goal 2 (Week 2): Pt will perform sit<>stand with min assist consistently PT Short Term Goal 3 (Week 2): Pt will initiate stair training  Skilled Therapeutic Interventions/Progress Updates:    Patient received up in [facility], pleasant and willing to participate in PT session. Continued working on [facility] mobility with MinA and patient continuing to be easily fatigued, complaining of L UE pain limiting mobility today. Transported her totalA in [facility] for time management to PT gym and worked on sit to stand and weight shifting over R LE at parallel bars. After rest break, staggered L LE up onto wooden platform of bars and left R LE on ground and performed sit to stand x5 with heavy ModA increasing to MaxA with fatigue but able to get trace activation of LE musculature with this exercise. Transported patient back to room totalA, she was left up in chair with alarm activated and all needs met.   Therapy Documentation Precautions:  Precautions Precautions: Fall Precaution Comments: R hemi, R inattention Restrictions Weight Bearing Restrictions: No General:   Vital Signs:   Pain: Pain Assessment Pain Scale: 0-10 Pain Score: 0-No pain    Therapy/Group: Individual Therapy  Deniece Ree PT, DPT, CBIS  Supplemental Physical Therapist [facility]    Pager [phone] Acute Rehab Office [phone]   11/26/2017, 12:20 PM

## 2017-11-27 ENCOUNTER — Inpatient Hospital Stay (HOSPITAL_COMMUNITY): Payer: Medicare Other | Admitting: Physical Therapy

## 2017-11-27 NOTE — Progress Notes (Signed)
Patient refused CPAP for tonight 

## 2017-11-27 NOTE — Progress Notes (Signed)
  Subjective/Complaints:  No issues overnight per patient denies any abdominal pains.  ROS denies CP, SOB, N/V/D  Objective: Vital Signs: Blood pressure 121/65, pulse 90, temperature 98 F (36.7 C), temperature source Oral, resp. rate 18, height 5\' 1"  (1.549 m), weight 100.1 kg, SpO2 93 %. No results found. No results found for this or any previous visit (from the past 72 hour(s)).   Constitutional: No distress . Vital signs reviewed. HENT: Normocephalic.  Atraumatic. Eyes: EOMI. No discharge. Cardiovascular: RRR.  No JVD. Respiratory: CTA bilaterally.  Normal effort. GI: BS +.  Mildly distended, non tender Musc: No edema or tenderness in extremities. Skin:   Intact.  Warm and dry. Neuro: Alert/Oriented Motor: 0/5 in RUE/RLE, stable 4/5 LUE/LLE  Dysarthria   Assessment/Plan: 1. Functional deficits secondary to Right hemiparesis, left subcortical infarct which require 3+ hours per day of interdisciplinary therapy in a comprehensive inpatient rehab setting. Physiatrist is providing close team supervision and 24 hour management of active medical problems listed below. Physiatrist and rehab team continue to assess barriers to discharge/monitor patient progress toward functional and medical goals. FIM:                                  Medical Problem List and Plan: 1.  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  CIR PT, OT, SLP 2.  DVT Prophylaxis/Anticoagulation: Chronic Eliquis has been resumed. 3. Pain Management: Tylenol as needed  Chronic migraines, treated with topamax 100mg  BID as outpt per PCP, restarted at 25mg  BID Improved continue on Topamax 25 mg twice daily 4. Mood: Xanax 1 mg nightly 5. Neuropsych: This patient is?  Fully  capable of making decisions on her own behalf. 6. Skin/Wound Care: Routine skin checks 7. 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  Advance diet as tolerated 9.  Hypokalemia.    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  Advance to regular diet tolerating without difficulty.   11.  Diastolic congestive heart failure.  Demadex 20 mg held due to hypokalemia.  Monitor for any signs of fluid overload  Mild pre tib edema R>L no need to restart torsemide 12.  Hypertension.  Monitor with increased mobility Vitals:   11/26/17 1939 11/27/17 0458  BP: 129/63 121/65  Pulse: 65 90  Resp: 17 18  Temp: 98.4 F (36.9 C) 98 F (36.7 C)  SpO2: 99% 93%   Controlled 10/12          13.  PAF.  Continue Eliquis.  Cardiac rate controlled 10/12 14.  Hypothyroidism.  Continue Synthroid 15.  CKD stage II.    Creatinine 1.13 on 10/1  Stable 16.  Hyperlipidemia.  Lipitor 17.  Hypoalbuminemia  Supplement initiated  LOS (Days) 12 A FACE TO FACE EVALUATION WAS PERFORMED  Charlett Blake 11/27/2017, 12:58 PM

## 2017-11-27 NOTE — Progress Notes (Signed)
Physical Therapy Session Note  Patient Details  Name: Holly Flores MRN: [id number] Date of Birth: [birth year]  Today's Date: 11/27/2017 PT Individual Time: 4098-1191 PT Individual Time Calculation (min): 67 min   Short Term Goals: Week 2:  PT Short Term Goal 1 (Week 2): Pt will ambulate x30 ft with Max assist +1 and LRAD  PT Short Term Goal 2 (Week 2): Pt will perform sit<>stand with min assist consistently PT Short Term Goal 3 (Week 2): Pt will initiate stair training  Skilled Therapeutic Interventions/Progress Updates:   Pt in w/c and agreeable to therapy, denies pain. Total assist transport to/from gym for time management. Worked on w/c mobility first part of session w/ shoes on and cushion taken from chair to lower in slightly for improved foot contact w/ ground. Pt w/ improved ability to self-propel after this, able to do so w/ supervision and verbal cues for technique via L hemi technique. Worked on gait and pre-gait tasks at rail today. Ambulated 30' x2 w/ max assist at rail, manual and tactile cues for R hip and knee extension in single leg stance and for R foot placement in swing. Min manual assist to facilitate lateral weight shifting. Added yoga block during 2nd bout to work on R hip flexion during swing, minimal help but pt able to initiate more w/ visual target. Performed RLE kicking to target while standing on 2" step w/ LLE at rail to allow for increased foot clearance. Returned to room and ended session in w/c, all needs in reach.   Therapy Documentation Precautions:  Precautions Precautions: Fall Precaution Comments: R hemi, R inattention Restrictions Weight Bearing Restrictions: No  Therapy/Group: Individual Therapy  [name] Clent Demark 11/27/2017, 2:27 PM

## 2017-11-28 ENCOUNTER — Inpatient Hospital Stay (HOSPITAL_COMMUNITY): Payer: Medicare Other

## 2017-11-28 NOTE — Progress Notes (Signed)
  Subjective/Complaints:  Doing well this morning.  No headaches or abdominal pain  ROS denies CP, SOB, N/V/D  Objective: Vital Signs: Blood pressure (!) 116/59, pulse 77, temperature 99 F (37.2 C), temperature source Oral, resp. rate 18, height 5\' 1"  (1.549 m), weight 100.1 kg, SpO2 94 %. No results found. No results found for this or any previous visit (from the past 72 hour(s)).   Constitutional: No distress . Vital signs reviewed. HENT: Normocephalic.  Atraumatic. Eyes: EOMI. No discharge. Cardiovascular: RRR.  No JVD. Respiratory: CTA bilaterally.  Normal effort. GI: BS +.  Mildly distended, non tender Musc: No edema or tenderness in extremities. Skin:   Intact.  Warm and dry. Neuro: Alert/Oriented Motor: 0/5 in RUE/RLE, stable 4/5 LUE/LLE  Dysarthria   Assessment/Plan: 1. Functional deficits secondary to Right hemiparesis, left subcortical infarct which require 3+ hours per day of interdisciplinary therapy in a comprehensive inpatient rehab setting. Physiatrist is providing close team supervision and 24 hour management of active medical problems listed below. Physiatrist and rehab team continue to assess barriers to discharge/monitor patient progress toward functional and medical goals. FIM:                                  Medical Problem List and Plan: 1.  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  CIR PT, OT, SLP 2.  DVT Prophylaxis/Anticoagulation: Chronic Eliquis has been resumed. 3. Pain Management: Tylenol as needed  Chronic migraines, treated with topamax 100mg  BID as outpt per PCP, restarted at 25mg  BID Improved continue on Topamax 25 mg twice daily 4. Mood: Xanax 1 mg nightly 5. Neuropsych: This patient is?  Fully  capable of making decisions on her own behalf. 6. Skin/Wound Care: Routine skin checks 7. 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  Advance diet as tolerated 9.  Hypokalemia.    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 11.  Diastolic congestive heart failure.  Demadex 20 mg held due to hypokalemia.  Monitor for any signs of fluid overload  Mild pre tib edema R>L no need to restart torsemide, continue to monitor 12.  Hypertension.  Monitor with increased mobility Vitals:   11/27/17 2040 11/28/17 0500  BP: (!) 112/56 (!) 116/59  Pulse: 62 77  Resp: 18 18  Temp: 98.6 F (37 C) 99 F (37.2 C)  SpO2: 96% 94%   Controlled 10/13          13.  PAF.  Continue Eliquis.  Cardiac rate controlled 10/13 14.  Hypothyroidism.  Continue Synthroid 15.  CKD stage II.    Creatinine 1.13 on 10/1  Stable 16.  Hyperlipidemia.  Lipitor 17.  Hypoalbuminemia  Supplement initiated  LOS (Days) 13 A FACE TO FACE EVALUATION WAS PERFORMED  Charlett Blake 11/28/2017, 11:23 AM

## 2017-11-28 NOTE — Progress Notes (Signed)
Physical Therapy Session Note  Patient Details  Name: Holly Flores MRN: [id number] Date of Birth: 02-20-1945  Today's Date: 11/28/2017 PT Individual Time: 0802-0900 PT Individual Time Calculation (min): 58 min   Short Term Goals: Week 2:  PT Short Term Goal 1 (Week 2): Pt will ambulate x30 ft with Max assist +1 and LRAD  PT Short Term Goal 2 (Week 2): Pt will perform sit<>stand with min assist consistently PT Short Term Goal 3 (Week 2): Pt will initiate stair training  Skilled Therapeutic Interventions/Progress Updates:    Pt supine in bed upon PT arrival, agreeable to therapy tx and denies pain. Pt transferred from supine>sitting EOB with mod assist. Pt performed squat pivot transfer from bed>w/c with max assist and transported to the gym. Pt transferred from w/c>mat mod assist squat pivot with verbal cues for techniques. Therapist performed w/c parts management in order to tighten bilateral brakes for increased safety and independence with transfers. Therapist provided pt with home measurement sheet this session and instructed to have daughter fill out. Pt also reports that she already has w/c at home, therapist requested that family bring this in as well. Pt worked on slideboard transfers this session from w/c<>mat for increased independence. Pt able to perform x 2 slideboard transfers in each direction with min-mod assist, step stool under feet and verbal cues for head hips relationship. Pt transported back to room and left seated in w/c with needs in reach, chair alarm set.   Therapy Documentation Precautions:  Precautions Precautions: Fall Precaution Comments: R hemi, R inattention Restrictions Weight Bearing Restrictions: No    Therapy/Group: Individual Therapy  Netta Corrigan, PT, DPT 11/28/2017, 7:51 AM

## 2017-11-28 NOTE — Plan of Care (Signed)
  Problem: Consults Goal: RH STROKE PATIENT EDUCATION Description See Patient Education module for education specifics  Outcome: Progressing   Problem: RH BOWEL ELIMINATION Goal: RH STG MANAGE BOWEL WITH ASSISTANCE Description STG Manage Bowel with [place].  Outcome: Progressing Goal: RH STG MANAGE BOWEL W/MEDICATION W/ASSISTANCE Description STG Manage Bowel with Medication with min Assistance.  Outcome: Progressing   Problem: RH BLADDER ELIMINATION Goal: RH STG MANAGE BLADDER WITH ASSISTANCE Description STG Manage Bladder With Min Assistance  Outcome: Progressing   Problem: RH PAIN MANAGEMENT Goal: RH STG PAIN MANAGED AT OR BELOW PT'S PAIN GOAL Description <4 out of 10.   Outcome: Progressing

## 2017-11-28 NOTE — Progress Notes (Signed)
Asked patient about using CPAP tonight.  Patient refused.

## 2017-11-29 ENCOUNTER — Inpatient Hospital Stay (HOSPITAL_COMMUNITY): Payer: Medicare Other | Admitting: Occupational Therapy

## 2017-11-29 ENCOUNTER — Inpatient Hospital Stay (HOSPITAL_COMMUNITY): Payer: Self-pay

## 2017-11-29 ENCOUNTER — Encounter (HOSPITAL_COMMUNITY): Payer: Medicare Other | Admitting: Psychology

## 2017-11-29 ENCOUNTER — Inpatient Hospital Stay (HOSPITAL_COMMUNITY): Payer: Medicare Other

## 2017-11-29 ENCOUNTER — Inpatient Hospital Stay (HOSPITAL_COMMUNITY): Payer: Self-pay | Admitting: Physical Therapy

## 2017-11-29 DIAGNOSIS — N183 Chronic kidney disease, stage 3 unspecified: Secondary | ICD-10-CM

## 2017-11-29 DIAGNOSIS — R0989 Other specified symptoms and signs involving the circulatory and respiratory systems: Secondary | ICD-10-CM

## 2017-11-29 NOTE — Plan of Care (Signed)
  Problem: Consults Goal: RH STROKE PATIENT EDUCATION Description See Patient Education module for education specifics  Outcome: Progressing   Problem: RH BOWEL ELIMINATION Goal: RH STG MANAGE BOWEL WITH ASSISTANCE Description STG Manage Bowel with [place].  Outcome: Progressing Goal: RH STG MANAGE BOWEL W/MEDICATION W/ASSISTANCE Description STG Manage Bowel with Medication with min Assistance.  Outcome: Progressing   Problem: RH BLADDER ELIMINATION Goal: RH STG MANAGE BLADDER WITH ASSISTANCE Description STG Manage Bladder With Min Assistance  Outcome: Progressing   Problem: RH PAIN MANAGEMENT Goal: RH STG PAIN MANAGED AT OR BELOW PT'S PAIN GOAL Description <4 out of 10.   Outcome: Progressing

## 2017-11-29 NOTE — Progress Notes (Signed)
Occupational Therapy Session Note  Patient Details  Name: Holly Flores MRN: [id number] Date of Birth: 1945/10/29  Today's Date: 11/29/2017 OT Individual Time: 0903-1000 OT Individual Time Calculation (min): 57 min    Short Term Goals: Week 2:  OT Short Term Goal 1 (Week 2): Pt will don shirt with mod A sitting EOB OT Short Term Goal 2 (Week 2): Pt will complete toilet transfer with mod assist of one caregiver OT Short Term Goal 3 (Week 2): Pt will don pants with modA.   Skilled Therapeutic Interventions/Progress Updates:    Pt presents supine in bed agreeable to therapy session. Pt reports having increased shoulder pain last night in R shoulder, reports has improved overnight with use of heat pack. Completed bathing/dressing ADLs from w/c level this session for additional focus on functional transfers and standing balance during task completion. Pt performs bed mobility with modA, completed slide board transfer EOB>w/c to the R with mod-maxA and min cues for head/hips relationship. Seated in w/c pt performed bathing task with overall modA for washing buttocks and for hand over hand assist to wash LUE with RUE, min cues to attend to and wash RUE this session. Pt using LH sponge for lower portion of LEs. Pt donning overhead shirt, with improvements noted in ability to carryover and perform each part of task, though overall continues to require mod-maxA to fully complete. Trialed use of trashcan as stepstool for easier access to LEs when donning pants. Pt with increased ease in ability to don L LE into pantleg with doing so and using reacher. Positioned pt next to bedrail for UE support during sit<>stands with ADL tasks. Pt perfoming sit<>stand throughout with overall modA, able to maintain static standing with minA during LB tasks including advancing pants over hips. Provided gentle retrograde massage to RUE end of session as increased edema noted in hand and digits. Pt left sitting up in w/c end  of session with chair alarm set, lap tray in place, call bell and needs within reach.    Therapy Documentation Precautions:  Precautions Precautions: Fall Precaution Comments: R hemi, R inattention Restrictions Weight Bearing Restrictions: No    Therapy/Group: Individual Therapy  Raymondo Band 11/29/2017, 7:36 AM

## 2017-11-29 NOTE — Progress Notes (Signed)
Orthopedic Tech Progress Note Patient Details:  Holly Flores 1945/05/17 [id number]  Patient ID: Holly Flores, female   DOB: [birth year], 72 y.o.   MRN: [id number]   Holly Flores 11/29/2017, 10:55 AMCalled [facility] for right resting hand splint, cockup splint and Prafo boot.

## 2017-11-29 NOTE — Progress Notes (Signed)
  Subjective/Complaints: Patient seen laying in bed this morning.  She states she slept well overnight.  She states she had a good weekend.  ROS: Denies CP, SOB, N/V/D  Objective: Vital Signs: Blood pressure (!) 148/77, pulse 75, temperature 99.5 F (37.5 C), temperature source Oral, resp. rate 18, height 5\' 1"  (1.549 m), weight 100.1 kg, SpO2 99 %. No results found. No results found for this or any previous visit (from the past 72 hour(s)).   Constitutional: No distress . Vital signs reviewed. HENT: Normocephalic.  Atraumatic. Eyes: EOMI. No discharge. Cardiovascular: RRR.  No JVD. Respiratory: CTA bilaterally.  Normal effort. GI: BS +.  Mildly distended, non tender Musc: No edema or tenderness in extremities. Neuro: Alert/Oriented Motor: 0/5 in RUE/RLE, unchanged Increased tone in right finger flexors and ankle plantar flexors 4/5 LUE/LLE  Dysarthria Skin:   Intact.  Warm and dry.  Assessment/Plan: 1. Functional deficits secondary to Right hemiparesis, left subcortical infarct which require 3+ hours per day of interdisciplinary therapy in a comprehensive inpatient rehab setting. Physiatrist is providing close team supervision and 24 hour management of active medical problems listed below. Physiatrist and rehab team continue to assess barriers to discharge/monitor patient progress toward functional and medical goals. FIM:                                  Medical Problem List and Plan: 1.  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  Cont CIR   WHO/PRAFO ordered 2.  DVT Prophylaxis/Anticoagulation: Chronic Eliquis has been resumed. 3. Pain Management: Tylenol as needed  Chronic migraines, treated with topamax 100mg  BID as outpt per PCP,  restarted at 25mg  BID  Improved continue on Topamax 25 mg twice daily 4. Mood: Xanax 1 mg nightly 5. Neuropsych: This patient is?  Fully capable of making decisions on her own behalf. 6. Skin/Wound Care: Routine skin checks 7. 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  Advance diet as tolerated 9.  Hypokalemia.      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    Resolved. 11.  Diastolic congestive heart failure.  Demadex 20 mg held due to hypokalemia.  Monitor for any signs of fluid overload Filed Weights   11/22/17 0401 11/23/17 0421 11/24/17 0512  Weight: 99.9 kg 100 kg 100.1 kg   12.  Hypertension.  Monitor with increased mobility Vitals:   11/28/17 1950 11/29/17 0305  BP: 114/64 (!) 148/77  Pulse: 60 75  Resp: 18 18  Temp: 98.5 F (36.9 C) 99.5 F (37.5 C)  SpO2: 96% 99%   Labile on 10/14 13.  PAF.  Continue Eliquis.  Cardiac rate controlled 10/14 14.  Hypothyroidism.  Continue Synthroid 15.  CKD stage II.    Creatinine 1.13 on 10/1  Labs ordered for tomorrow  Stable 16.  Hyperlipidemia.  Lipitor 17.  Hypoalbuminemia  Supplement initiated  LOS (Days) 14 A FACE TO FACE EVALUATION WAS PERFORMED  Ankit Lorie Phenix 11/29/2017, 9:29 AM

## 2017-11-29 NOTE — Progress Notes (Signed)
Physical Therapy Session Note  Patient Details  Name: Holly Flores MRN: [id number] Date of Birth: April 20, 1945  Today's Date: 11/29/2017 PT Individual Time: 1100-1200 and 1600-1640 PT Individual Time Calculation (min): 60 min and 40 min   Short Term Goals: Week 2:  PT Short Term Goal 1 (Week 2): Pt will ambulate x30 ft with Max assist +1 and LRAD  PT Short Term Goal 2 (Week 2): Pt will perform sit<>stand with min assist consistently PT Short Term Goal 3 (Week 2): Pt will initiate stair training  Skilled Therapeutic Interventions/Progress Updates:    Session 1: Pt seated in w/c upon PT arrival,agreeable to therapy tx and denies pain. Pt transported to the gym. Pt worked on sit<>stands from w/c this session x 5 pushing up from w/c arm rest and using RW in standing for balance. With sitting pt worked on eccentric control through LEs to sit without UE support, tactile cues for anterior trunk lean. Pt worked on pre-gait with RW and R LE stance control while stepping in place forward/back with L LE, mod assist. Pt worked on static standing balance without UE support, tactile/verbal cues to correct R lateral lean. Pt ambulated x 20 ft at L rail with max assist, therapist blocking R knee and helping to advance R LE during swing. Pt worked on squat pivot transfers x 2 from w/c<>mat with mod assist, emphasis on clearing buttocks from mat and pushing through LEs. Kinesio taping to R hand for edema control. Pt transported back to room and left seated in w/c with needs in reach and family member present.   Session 2: Pt seated in w/c upon PT arrival, agreeable to therapy tx and denies pain. Pt performed squat pivot to the mat with mod assist. Pt used maxisky this session in order to work on standing balance and pre-gait tasks. Pt performed x 3 bouts of standing with the maxisky for support, therapist providing manual facilitation for R LE hip extension and knee extension in standing, mirror used for visual  feedback of posture and L chair back used for L UE support. Once pt was stable with UE support, worked on maintaining midline in [place] without UE support. Pt transferred back to w/c squat pivot mod assist and transported back to room, left seated with needs in reach.   Therapy Documentation Precautions:  Precautions Precautions: Fall Precaution Comments: R hemi, R inattention Restrictions Weight Bearing Restrictions: No    Therapy/Group: Individual Therapy  Drema Dallas Schagen,PT, DPT 11/29/2017, 7:57 AM

## 2017-11-29 NOTE — Progress Notes (Signed)
Physical Therapy Session Note  Patient Details  Name: Holly Flores MRN: [id number] Date of Birth: June 13, 1945  Today's Date: 11/29/2017 PT Individual Time: 1300-1357 PT Individual Time Calculation (min): 57 min   Short Term Goals: Week 2:  PT Short Term Goal 1 (Week 2): Pt will ambulate x30 ft with Max assist +1 and LRAD  PT Short Term Goal 2 (Week 2): Pt will perform sit<>stand with min assist consistently PT Short Term Goal 3 (Week 2): Pt will initiate stair training  Skilled Therapeutic Interventions/Progress Updates:    Patient received up in [facility], pleasant and willing to participate in skilled PT session today. Transported via [place] in [place] to PT gym due to B shoulder pain this afternoon. Focused this session on standing tolerance and equal weight bearing today; started with tossing bean bags into basketball net with cross-midline reaches with multiple rest breaks due to fatigue. Also worked on sorting bean bag task in standing, also for endurance and midline weight bearing, patient in need of Mod cues for sorting and appeared to have difficulty in sorting appropriate categories, also required [place] for attempting to weight shift over R LE and Mod cues for upright posture. Limited by pain R knee and fatigue in general. Finished session with dynamic unsupported sitting balance reaching as high as tolerated and reaching cross midline playing checkers, limited by B shoulder pain. She was returned to her room totalA in [facility] and left up in chair with alarm pad activated, family present this afternoon.   Therapy Documentation Precautions:  Precautions Precautions: Fall Precaution Comments: R hemi, R inattention Restrictions Weight Bearing Restrictions: No General:   Pain: Pain Assessment Pain Scale: 0-10 Pain Score: 0-No pain Faces Pain Scale: No hurt    Therapy/Group: Individual Therapy  Deniece Ree PT, DPT, CBIS  Supplemental Physical Therapist [facility]    Pager  [phone] Acute Rehab Office [phone]   11/29/2017, 3:39 PM

## 2017-11-29 NOTE — Progress Notes (Signed)
Pt declined use of CPAP for the night- pt on 2lpm cannula for sleep.

## 2017-11-29 NOTE — Consult Note (Signed)
Neuropsychological Consultation   Patient:   Holly Flores   DOB:   September 09, 1945  MR Number:  [id number]  Location:  [facility] [facility] [place] [id number] [place] [address] Dept: [place]: [phone]           Date of Service:   11/29/2017  Start Time:   8 AM End Time:   9 AM  Provider/Observer:  Ilean Skill, Psy.D.       Clinical Neuropsychologist       Billing Code/Service: [phone] 4 Units  Chief Complaint:    Holly Flores is a 72 year old female with history of diastolic congestive hear failure, PAF, CKD stage II, COPD, hypertension, hyperlipidemia.  Presented 10/29/2017 to [facility] with right sided weakness, diplopia and difficulty speaking.  MRI showed left brain infarction.  Acute infarction of left external capsule posteriorly.  Chronic microhemorrhage left frontal white matter adjacent to head of the caudate.  Patient has been progressing in CIR program therapies with setback after developing diffuse abdominal discomfort on 11/08/2017.  Patient has returned to [facility] program for comprehensive rehab therapies.   The above chief complaint remains accurate for current visit.  The patient reports that her depressive symptoms are improving but she is still struggling with long hospital stay and physical limitations due to motor deficits.  Reason for Service:  The patient was referred for neuropsychological consultation due to coping and adjustment issues.  She continues to report depressive symptoms  But dues feel like they are improving.  Below is the HPI for the current admission.    HPI: Holly Flores is a 72 year old right-handed African-American female with history of diastolic congestive heart failure, PAF maintained on Eliquis since July 2019 followed by Dr. Carlyle Dolly, CKD stage II, COPD, hypertension hyperlipidemia.  Per chart review and patient, patient lives with spouse.   Independent prior to admission and driving.  One level home with 3 steps to entry.  Good support of extended family.  Husband is receiving treatment for leukemia at [name] hospital but can assist.  Presented 10/29/2017 to [facility] with right sided weakness, diplopia and difficulty speaking.  Troponin negative.  MRI reviewed, showing left brain infarction.  Per CT and MRI report, acute infarction left external capsule posteriorly.  Chronic microhemorrhage left frontal white matter adjacent to the head of the caudate.  CT angiogram of head and neck with no large vessel occlusion.  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  Echocardiogram with ejection fraction of 65% no wall motion abnormalities without thrombus.  Patient did not receive TPA.  Follow-up neurology services.  Patient's chronic Eliquis initially held to avoid hemorrhagic transformation initially placed on aspirin and Eliquis later resumed 11/01/2017 and aspirin at that time discontinued.  She was on mechanical soft diet.  Patient was admitted to inpatient rehab services 11/01/2017.  Patient progressing slowly with therapies.  Developed  diffuse abdominal discomfort worse on left lower quadrant 11/08/2017.  KUB completed concerning for possible ileus.  Her diet was changed to full liquids.  Noted significant hypokalemia 2.7 received intravenous supplement.  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  C. difficile specimen negative.  CT of the abdomen 11/09/2017 showed fluid-filled distended colon with transverse colon measuring 13 cm.  No distal obstructing lesion noted.  There was an area of questionable narrowing of the splenic flexure of  the colon.  Patient was discharged to acute care services for further evaluation 11/09/2017.  Gastroenterology services consulted and advised conservative care fleets enema and twice a day MiraLAX was ordered.   Narrowing of splenic flexure most likely peristalsis and monitored.  Hypokalemia improving 3.5 11/15/2017.  Patient remains on Eliquis for history of CVA/ PAF.  Latest abdominal film shows mild improvement.  Patient with no further bouts of nausea or vomiting.  Diet has been advanced to mechanical soft which was her diet prior to ileus.  Therapy evaluations completed with recommendations to resume inpatient rehab services.  Patient was admitted for a comprehensive rehab program.  Current Status:  The patient reports that she has improved but improving symptions of depression and worry.  She reports that she has continued to worry about her husband while she has been hospitalized.  Significant improvement in expressive language.    Behavioral Observation: Holly Flores  presents as a 72 y.o.-year-old Right African American Female who appeared her stated age. her dress was Appropriate and she was Well Groomed and her manners were Appropriate to the situation.  her participation was indicative of Appropriate and Attentive behaviors.  There were any physical disabilities noted.  she displayed an appropriate level of cooperation and motivation.     Interactions:    Active Appropriate and Attentive  Attention:   within normal limits and attention span appeared shorter than expected for age  Memory:   abnormal; some issues with recall of information but with time and cueing she was able to retreive information  Visuo-spatial:  not examined  Speech (Volume):  low  Speech:   slurred; appeared to be some word finding issues.  Thought Process:  Coherent and Relevant  Though Content:  WNL; not suicidal and not homicidal  Orientation:   person, place, time/date and situation  Judgment:   Fair  Planning:   Fair  Affect:    Anxious and Depressed  Mood:    Anxious and Depressed  Insight:   Good  Intelligence:   normal  Medical History:   Past Medical History:  Diagnosis Date  . Anxiety    . Arthritis   . CHF (congestive heart failure) ([place])   . COPD (chronic obstructive pulmonary disease) ([place])   . Depression   . GERD (gastroesophageal reflux disease)   . Goiter   . Hyperlipidemia   . Hypertension   . Hypothyroidism   . Kidney cysts    left side  . Myocardial infarction ([place])    8 yrs ago.   . 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 Shortness of breath   . Sleep apnea    uses CPAP, 3  . Stroke ([place])   . 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    Psychiatric History:  Patient does have past history of some depression and anxiety, but she reports acute worsening of these symptoms.    Family Med/Psych History:  Family History  Problem Relation Age of Onset  . Cancer Other   . Diabetes Other   . Heart disease Other   . Hypertension Other   . Asthma Other   . Alcoholism Other   . Thyroid disease Other   . Rheumatologic disease Other   . Stroke Other   . Hypercholesterolemia Other   . Colon cancer Neg Hx     Risk of Suicide/Violence: low Patient denies SI or HI.  Impression/DX:  Holly Flores is a 72 year old  female with history of diastolic congestive hear failure, PAF, CKD stage II, COPD, hypertension, hyperlipidemia.  Presented 10/29/2017 to [facility] with right sided weakness, diplopia and difficulty speaking.  MRI showed left brain infarction.  Acute infarction of left external capsule posteriorly.  Chronic microhemorrhage left frontal white matter adjacent to head of the caudate.  Patient has been progressing in CIR program therapies with setback after developing diffuse abdominal discomfort on 11/08/2017.  Patient has returned to [facility] program for comprehensive rehab therapies.   The patient reports that she has improved but improving symptions of depression and worry.  She reports that she has continued to worry about her husband while she has been hospitalized.  Significant improvement in expressive language.   Disposition/Plan:  Continued to work on coping  and adjustment issues with planning for completing therapies and discharge home.    Diagnosis:    Reactive Depression and Anxiety to current medical status         Electronically Signed   _______________________ Ilean Skill, Psy.D.

## 2017-11-30 ENCOUNTER — Inpatient Hospital Stay (HOSPITAL_COMMUNITY): Payer: Self-pay | Admitting: Physical Therapy

## 2017-11-30 ENCOUNTER — Inpatient Hospital Stay (HOSPITAL_COMMUNITY): Payer: Self-pay

## 2017-11-30 ENCOUNTER — Inpatient Hospital Stay (HOSPITAL_COMMUNITY): Payer: Medicare Other | Admitting: Occupational Therapy

## 2017-11-30 DIAGNOSIS — D62 Acute posthemorrhagic anemia: Secondary | ICD-10-CM

## 2017-11-30 LAB — BASIC METABOLIC PANEL
ANION GAP: 7 (ref 5–15)
BUN: 13 mg/dL (ref 8–23)
CO2: 23 mmol/L (ref 22–32)
Calcium: 9.3 mg/dL (ref 8.9–10.3)
Chloride: 111 mmol/L (ref 98–111)
Creatinine, Ser: 1.06 mg/dL — ABNORMAL HIGH (ref 0.44–1.00)
GFR calc non Af Amer: 52 mL/min — ABNORMAL LOW (ref >60)
GFR, EST AFRICAN AMERICAN: 60 mL/min — AB (ref >60)
Glucose, Bld: 101 mg/dL — ABNORMAL HIGH (ref 70–99)
POTASSIUM: 3.4 mmol/L — AB (ref 3.5–5.1)
SODIUM: 141 mmol/L (ref 135–145)

## 2017-11-30 LAB — CBC WITH DIFFERENTIAL/PLATELET
Abs Immature Granulocytes: 0.02 10*3/uL (ref 0.00–0.07)
BASOS ABS: 0 10*3/uL (ref 0.0–0.1)
Basophils Relative: 0 %
EOS PCT: 7 %
Eosinophils Absolute: 0.5 10*3/uL (ref 0.0–0.5)
HCT: 35.8 % — ABNORMAL LOW (ref 36.0–46.0)
HEMOGLOBIN: 11.4 g/dL — AB (ref 12.0–15.0)
IMMATURE GRANULOCYTES: 0 %
LYMPHS PCT: 48 %
Lymphs Abs: 3.4 10*3/uL (ref 0.7–4.0)
MCH: 29.7 pg (ref 26.0–34.0)
MCHC: 31.8 g/dL (ref 30.0–36.0)
MCV: 93.2 fL (ref 80.0–100.0)
Monocytes Absolute: 0.5 10*3/uL (ref 0.1–1.0)
Monocytes Relative: 7 %
NEUTROS ABS: 2.7 10*3/uL (ref 1.7–7.7)
NEUTROS PCT: 38 %
NRBC: 0 % (ref 0.0–0.2)
Platelets: 208 10*3/uL (ref 150–400)
RBC: 3.84 MIL/uL — AB (ref 3.87–5.11)
RDW: 13.6 % (ref 11.5–15.5)
WBC: 7.1 10*3/uL (ref 4.0–10.5)

## 2017-11-30 MED ORDER — POTASSIUM CHLORIDE CRYS ER 20 MEQ PO TBCR: 30.0000 meq | EXTENDED_RELEASE_TABLET | Freq: Two times a day (BID) | ORAL | Status: DC

## 2017-11-30 MED ORDER — POTASSIUM CHLORIDE CRYS ER 20 MEQ PO TBCR
30.0000 meq | EXTENDED_RELEASE_TABLET | Freq: Two times a day (BID) | ORAL | Status: DC
  Administered 2017-11-30 – 2017-12-08 (×17): 30 meq via ORAL
  Filled 2017-11-30 (×17): qty 1

## 2017-11-30 NOTE — Progress Notes (Signed)
Patient continues to refuse CPAP. RT changed order to PRN.

## 2017-11-30 NOTE — Progress Notes (Signed)
Nutrition Brief Note  Patient identified on the Malnutrition Screening Tool (MST) Report.  72 year old female with PMH significant for CHF, PAF, CKD stage II, COPD, hypertension, and hyperlipidemia. Pt experienced a L brain infarction and had a KUB with concerns for ileus. Latest abdominal film shows improvement.  Spoke with pt at bedside who expressed frustration with [facility] diet restrictions. Spoke with PA who verbally approved liberalizing pt's diet to regular.  Pt has been eating well since admission with meal completion averaging 75%. Pt reports good appetite and intake PTA (2 meals daily which might include a sandwich). Pt states she has never been a breakfast eater but "picks" at her breakfast here. Pt reports pro-stat makes her vomit. RD to d/c order as PO intake is adequate and will likely improve with diet liberalized.  Pt denies any recent weight loss and states that she has been gaining weight. Confirmed in chart. Suspect weight gain related to fluid status.  NFPE completed. Findings are no fat depletion, no muscle depletion, and mild edema.  Wt Readings from Last 15 Encounters:  11/24/17 100.1 kg  11/09/17 95.9 kg  11/01/17 99.8 kg  09/06/17 99 kg  06/24/17 99.4 kg  06/18/17 102.2 kg  06/14/17 102.6 kg  05/18/17 100.2 kg  08/25/16 96.6 kg  02/26/16 102.7 kg  08/26/15 104.3 kg  04/24/15 103.9 kg  01/22/15 107 kg  01/16/15 105.7 kg  12/30/14 105.2 kg    Body mass index is 41.7 kg/m. Patient meets criteria for obesity class III based on current BMI.   Current diet order is Regular (liberalized this morning from [facility]), patient is consuming approximately 75% of meals at this time. Labs and medications reviewed.   No nutrition interventions warranted at this time. If nutrition issues arise, please consult RD.    Gaynell Face, MS, RD, LDN Inpatient Clinical Dietitian Pager: [phone] Weekend/After Hours: [phone]

## 2017-11-30 NOTE — Progress Notes (Signed)
Occupational Therapy Session Note  Patient Details  Name: Holly Flores MRN: [id number] Date of Birth: 12-06-1945  Today's Date: 11/30/2017 OT Individual Time: 4360-6770 OT Individual Time Calculation (min): 58 min    Short Term Goals: Week 2:  OT Short Term Goal 1 (Week 2): Pt will don shirt with mod A sitting EOB OT Short Term Goal 2 (Week 2): Pt will complete toilet transfer with mod assist of one caregiver OT Short Term Goal 3 (Week 2): Pt will don pants with modA.   Skilled Therapeutic Interventions/Progress Updates:    Pt presents supine in bed agreeable to therapy session, pt reports having intermittent shoulder pain yesterday but with no c/o pain during this session. Pt performs bed mobility with modA, slide board transfer EOB>w/c to the R with modA and improvements noted with pt's ability to elevate and advance hips during transfer completion. Pt performed bathing/dressing at sit<>stand level from w/c, positioned next to bed for use of bedrail/LUE support. Pt performing bathing with overall modA using AE and using lateral leans to wash buttocks. Pt completing sit<>stand with modA using LUE for standing balance during LB dressing. Attempted to have pt advance pants over L hip with therapist providing standing assist though with increased difficulty and ultimately requiring assist to complete. Overall pt requiring modA to perform LB dressing task this session as pt with improvements in ability to thread LLE into pantleg using reacher; donning button up shirt with modA. Pt left seated in w/c end of session with chair alarm set, lap tray in place, call bell and needs within reach.   Therapy Documentation Precautions:  Precautions Precautions: Fall Precaution Comments: R hemi, R inattention Restrictions Weight Bearing Restrictions: No    Therapy/Group: Individual Therapy  Raymondo Band 11/30/2017, 7:34 AM

## 2017-11-30 NOTE — Progress Notes (Signed)
Physical Therapy Session Note  Patient Details  Name: Holly Flores MRN: [id number] Date of Birth: [birth year]  Today's Date: 11/30/2017 PT Individual Time: 1000-1100 PT Individual Time Calculation (min): 60 min   Short Term Goals: Week 2:  PT Short Term Goal 1 (Week 2): Pt will ambulate x30 ft with Max assist +1 and LRAD  PT Short Term Goal 2 (Week 2): Pt will perform sit<>stand with min assist consistently PT Short Term Goal 3 (Week 2): Pt will initiate stair training  Skilled Therapeutic Interventions/Progress Updates:   Pt in w/c and agreeable to therapy, denies pain. Session focused on independence w/ functional mobility including w/c mobility and transfers. Pt's son returned home measurement sheet w/ only bedroom and bathroom door dimensions, both are 25". Explained to pt that the current w/c she uses in 27" and already fits snuggly on her. Pt's son bringing in her w/c from home for therapy to assess, pt understands that some accommodations may need to be made at home if she cannot access bathroom or bedroom. Pt worked on self-propelling w/c to gym, supervision for 50' at most, w/ min assist needed remainder of way via L hemi technique. Max verbal and visual cues for technique. Practiced multiple types of w/c<>mat transfers including stand pivot via hemiwalker, squat pivot, and lateral scoot on slideboard. All required mod-max assist in both directions, squat pivot was consistently the most safe. Will continue to assess most appropriate and safest transfer for home. Returned to room and ended session in w/c, all needs in reach.  Therapy Documentation Precautions:  Precautions Precautions: Fall Precaution Comments: R hemi, R inattention Restrictions Weight Bearing Restrictions: No  Therapy/Group: Individual Therapy  Shanica Castellanos K Danielly Ackerley 11/30/2017, 11:19 AM

## 2017-11-30 NOTE — Progress Notes (Signed)
Physical Therapy Weekly Progress Note  Patient Details  Name: Holly Flores MRN: [id number] Date of Birth: 04/12/1945  Beginning of progress report period: November 23, 2017 End of progress report period: November 30, 2017  Today's Date: 11/30/2017 PT Individual Time: 2446-2863 PT Individual Time Calculation (min): 70 min   Patient has met 1 of 3 short term goals.  Pt did not meet stair goal as this is no longer applicable at this time since the pt has gotten a ramp entrance installed for home. Pt is progressing towards her sit<>stand goal. Overall the pt is functioning at a min-mod assist level for all mobility aside from gait. Pt requires max assist or lift equipment to ambulate at this time for safety. Pt is beginning to demonstrate increased R LE muscle activation including quads and hip flexors.   Patient continues to demonstrate the following deficits muscle weakness, impaired timing and sequencing and decreased coordination, decreased safety awareness and decreased standing balance, decreased postural control, hemiplegia and decreased balance strategies and therefore will continue to benefit from skilled PT intervention to increase functional independence with mobility.  Patient progressing toward long term goals..  Continue plan of care.  PT Short Term Goals Week 2:  PT Short Term Goal 1 (Week 2): Pt will ambulate x30 ft with Max assist +1 and LRAD  PT Short Term Goal 1 - Progress (Week 2): Met PT Short Term Goal 2 (Week 2): Pt will perform sit<>stand with min assist consistently PT Short Term Goal 2 - Progress (Week 2): Progressing toward goal PT Short Term Goal 3 (Week 2): Pt will initiate stair training PT Short Term Goal 3 - Progress (Week 2): Not met Week 3:  PT Short Term Goal 1 (Week 3): STG=LTG due to ELOS  Skilled Therapeutic Interventions/Progress Updates:    Pt seated in w/c upon PT arrival, agreeable to therapy tx and denies pain. Pt transported to the gym total  assist. This session focused on gait training with the maxi sky. Pt performed sit<>stand from w/c within maxi sky and worked on Conservation officer, nature without AD and pre-gait with hemi walker. Pt ambulated within maxi sky 3 x 12 ft with hemi walker and x 12 ft backwards with HW, pt requiring manual facilitation for lateral weightshifting, able to advance R LE, assist for placement and knee block during stance. This session pt ambulated with shoe on L foot and sock/DF Ace wrap on right foot for increased foot clearance. After gait training pt performed sit<>stand with HW and performed pre-gait in place with mod assist (no maxi sky) in order to assess for carry over. Will continue to assess gait without lift equipment as able. Pt transported back to room and left seated in w/c with needs in reach and chair alarm set.   Therapy Documentation Precautions:  Precautions Precautions: Fall Precaution Comments: R hemi, R inattention Restrictions Weight Bearing Restrictions: No  Therapy/Group: Individual Therapy  Netta Corrigan, PT, DPT 11/30/2017, 7:50 AM

## 2017-11-30 NOTE — Progress Notes (Signed)
Subjective/Complaints: Patient seen lying in bed this morning.  She states that well overnight.  She states she is wearing her braces without any issues.  ROS: Denies CP, SOB, N/V/D  Objective: Vital Signs: Blood pressure 124/72, pulse 93, temperature 98.3 F (36.8 C), resp. rate 19, height '5\' 1"'  (1.549 m), weight 100.1 kg, SpO2 96 %. No results found. Results for orders placed or performed during the hospital encounter of 11/15/17 (from the past 72 hour(s))  Basic metabolic panel     Status: Abnormal   Collection Time: 11/30/17  6:02 AM  Result Value Ref Range   Sodium 141 135 - 145 mmol/L   Potassium 3.4 (L) 3.5 - 5.1 mmol/L   Chloride 111 98 - 111 mmol/L   CO2 23 22 - 32 mmol/L   Glucose, Bld 101 (H) 70 - 99 mg/dL   BUN 13 8 - 23 mg/dL   Creatinine, Ser 1.06 (H) 0.44 - 1.00 mg/dL   Calcium 9.3 8.9 - 10.3 mg/dL   GFR calc non Af Amer 52 (L) >60 mL/min   GFR calc Af Amer 60 (L) >60 mL/min    Comment: (NOTE) The eGFR has been calculated using the CKD EPI equation. This calculation has not been validated in all clinical situations. eGFR's persistently <60 mL/min signify possible Chronic Kidney Disease.    Anion gap 7 5 - 15    Comment: Performed at [facility] [address] [place], [place] [address]  CBC with Differential/Platelet     Status: Abnormal   Collection Time: 11/30/17  6:02 AM  Result Value Ref Range   WBC 7.1 4.0 - 10.5 K/uL   RBC 3.84 (L) 3.87 - 5.11 MIL/uL   Hemoglobin 11.4 (L) 12.0 - 15.0 g/dL   HCT 35.8 (L) 36.0 - 46.0 %   MCV 93.2 80.0 - 100.0 fL   MCH 29.7 26.0 - 34.0 pg   MCHC 31.8 30.0 - 36.0 g/dL   RDW 13.6 11.5 - 15.5 %   Platelets 208 150 - 400 K/uL   nRBC 0.0 0.0 - 0.2 %   Neutrophils Relative % 38 %   Neutro Abs 2.7 1.7 - 7.7 K/uL   Lymphocytes Relative 48 %   Lymphs Abs 3.4 0.7 - 4.0 K/uL   Monocytes Relative 7 %   Monocytes Absolute 0.5 0.1 - 1.0 K/uL   Eosinophils Relative 7 %   Eosinophils Absolute 0.5 0.0 - 0.5 K/uL   Basophils Relative 0 %   Basophils Absolute 0.0 0.0 - 0.1 K/uL   Immature Granulocytes 0 %   Abs Immature Granulocytes 0.02 0.00 - 0.07 K/uL    Comment: Performed at [facility], [address], [place], [place] [address]     Constitutional: No distress . Vital signs reviewed. HENT: Normocephalic.  Atraumatic. Eyes: EOMI. No discharge. Cardiovascular: RRR.  No JVD. Respiratory: CTA bilaterally.  Normal effort. GI: BS +.  Nondistended Musc: No edema or tenderness in extremities. Neuro: Alert/Oriented Motor: 0/5 in RUE/RLE, stable  Increased tone in right finger flexors and ankle plantar flexors 4/5 LUE/LLE, stable Dysarthria Skin:   Intact.  Warm and dry.  Assessment/Plan: 1. Functional deficits secondary to Right hemiparesis, left subcortical infarct which require 3+ hours per day of interdisciplinary therapy in a comprehensive inpatient rehab setting. Physiatrist is providing close team supervision and 24 hour management of active medical problems listed below. Physiatrist and rehab team continue to assess barriers to discharge/monitor patient progress toward functional and medical goals. FIM:  Medical Problem List and Plan: 1.  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  Cont CIR   WHO/PRAFO  2.  DVT Prophylaxis/Anticoagulation: Chronic Eliquis has been resumed. 3. Pain Management: Tylenol as needed  Chronic migraines, treated with topamax 18m BID as outpt per PCP, restarted at 241mBID  Topamax 25 mg twice daily   Controlled on 10/15 4. Mood: Xanax 1 mg nightly 5. Neuropsych: This patient is?  Fully capable of making decisions on her own behalf. 6. Skin/Wound Care: Routine skin checks 7.  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  Advance diet as tolerated 9.  Hypokalemia.      Potassium 3.4 on 10/15  Supplement increased on 10/15 10.  Ogilvie syndrome.    Resolved. 11.  Diastolic congestive heart failure.  Demadex 20 mg held due to hypokalemia.  Monitor for any signs of fluid overload Filed Weights   11/22/17 0401 11/23/17 0421 11/24/17 0512  Weight: 99.9 kg 100 kg 100.1 kg   12.  Hypertension.  Monitor with increased mobility Vitals:   11/29/17 2036 11/30/17 0559  BP: (!) 122/56 124/72  Pulse: (!) 54 93  Resp: 18 19  Temp: 98.4 F (36.9 C) 98.3 F (36.8 C)  SpO2: 100% 96%   Controlled on 10/15 13.  PAF.  Continue Eliquis.  Cardiac rate controlled 10/14 14.  Hypothyroidism.  Continue Synthroid 15.  CKD stage II.    Creatinine 1.06 on 10/15  Stable 16.  Hyperlipidemia.  Lipitor 17.  Hypoalbuminemia  Supplement initiated 18. ABLA  Hb 11.4 on 10/15  Cont to monitor  LOS (Days) 15 A FACE TO FACE EVALUATION WAS PERFORMED  Ankit AnLorie Phenix0/15/2019, 8:41 AM

## 2017-12-01 ENCOUNTER — Inpatient Hospital Stay (HOSPITAL_COMMUNITY): Payer: Medicare Other | Admitting: Occupational Therapy

## 2017-12-01 ENCOUNTER — Inpatient Hospital Stay (HOSPITAL_COMMUNITY): Payer: Self-pay

## 2017-12-01 DIAGNOSIS — N182 Chronic kidney disease, stage 2 (mild): Secondary | ICD-10-CM

## 2017-12-01 DIAGNOSIS — I10 Essential (primary) hypertension: Secondary | ICD-10-CM

## 2017-12-01 MED ORDER — TROLAMINE SALICYLATE 10 % EX CREA
TOPICAL_CREAM | Freq: Two times a day (BID) | CUTANEOUS | Status: DC | PRN
  Filled 2017-12-01: qty 85

## 2017-12-01 MED ORDER — MUSCLE RUB 10-15 % EX CREA
TOPICAL_CREAM | CUTANEOUS | Status: DC | PRN
  Filled 2017-12-01: qty 85

## 2017-12-01 NOTE — Progress Notes (Addendum)
Subjective/Complaints: Patient seen laying in bed this AM.  She slept well overnight.  Communication received from dietician regarding liberalizing diet.  She notes arthritis in her joints that was relieved with meds.   ROS: Denies CP, SOB, N/V/D  Objective: Vital Signs: Blood pressure 128/72, pulse 79, temperature 98 F (36.7 C), temperature source Oral, resp. rate 19, height '5\' 1"'  (1.549 m), weight 100.2 kg, SpO2 97 %. No results found. Results for orders placed or performed during the hospital encounter of 11/15/17 (from the past 72 hour(s))  Basic metabolic panel     Status: Abnormal   Collection Time: 11/30/17  6:02 AM  Result Value Ref Range   Sodium 141 135 - 145 mmol/L   Potassium 3.4 (L) 3.5 - 5.1 mmol/L   Chloride 111 98 - 111 mmol/L   CO2 23 22 - 32 mmol/L   Glucose, Bld 101 (H) 70 - 99 mg/dL   BUN 13 8 - 23 mg/dL   Creatinine, Ser 1.06 (H) 0.44 - 1.00 mg/dL   Calcium 9.3 8.9 - 10.3 mg/dL   GFR calc non Af Amer 52 (L) >60 mL/min   GFR calc Af Amer 60 (L) >60 mL/min    Comment: (NOTE) The eGFR has been calculated using the CKD EPI equation. This calculation has not been validated in all clinical situations. eGFR's persistently <60 mL/min signify possible Chronic Kidney Disease.    Anion gap 7 5 - 15    Comment: Performed at [facility] [address] [place], [place] [address]  CBC with Differential/Platelet     Status: Abnormal   Collection Time: 11/30/17  6:02 AM  Result Value Ref Range   WBC 7.1 4.0 - 10.5 K/uL   RBC 3.84 (L) 3.87 - 5.11 MIL/uL   Hemoglobin 11.4 (L) 12.0 - 15.0 g/dL   HCT 35.8 (L) 36.0 - 46.0 %   MCV 93.2 80.0 - 100.0 fL   MCH 29.7 26.0 - 34.0 pg   MCHC 31.8 30.0 - 36.0 g/dL   RDW 13.6 11.5 - 15.5 %   Platelets 208 150 - 400 K/uL   nRBC 0.0 0.0 - 0.2 %   Neutrophils Relative % 38 %   Neutro Abs 2.7 1.7 - 7.7 K/uL   Lymphocytes Relative 48 %   Lymphs Abs 3.4 0.7 - 4.0 K/uL   Monocytes Relative 7 %   Monocytes Absolute 0.5 0.1 -  1.0 K/uL   Eosinophils Relative 7 %   Eosinophils Absolute 0.5 0.0 - 0.5 K/uL   Basophils Relative 0 %   Basophils Absolute 0.0 0.0 - 0.1 K/uL   Immature Granulocytes 0 %   Abs Immature Granulocytes 0.02 0.00 - 0.07 K/uL    Comment: Performed at [facility], [address] [place], [place] [address]     Constitutional: No distress . Vital signs reviewed. HENT: Normocephalic.  Atraumatic. Eyes: EOMI. No discharge. Cardiovascular: RRR. No JVD. Respiratory: CTA bilaterally. Normal effort. GI: BS +.  Nondistended Musc: RUE/RLE edema  Neuro: Alert/Oriented Motor: 0/5 in RUE/RLE, unchanged Increased tone in right finger flexors and ankle plantar flexors 4/5 LUE/LLE, unchanged Dysarthria Skin:   Intact.  Warm and dry.  Assessment/Plan: 1. Functional deficits secondary to Right hemiparesis, left subcortical infarct which require 3+ hours per day of interdisciplinary therapy in a comprehensive inpatient rehab setting. Physiatrist is providing close team supervision and 24 hour management of active medical problems listed below. Physiatrist and rehab team continue to assess barriers to discharge/monitor patient  progress toward functional and medical goals. FIM:                                  Medical Problem List and Plan: 1.  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  Cont CIR   WHO/PRAFO  2.  DVT Prophylaxis/Anticoagulation: Chronic Eliquis has been resumed. 3. Pain Management: Tylenol as needed  Chronic migraines, treated with topamax 126m BID as outpt per PCP, restarted at 277mBID  Topamax 25 mg twice daily   Controlled on 10/15 4. Mood: Xanax 1 mg nightly 5. Neuropsych: This patient is?  Fully capable of making decisions on her  own behalf. 6. Skin/Wound Care: Routine skin checks 7. 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  Advance diet as tolerated 9.  Hypokalemia.      Potassium 3.4 on 10/15  Will order labs for Friday  Supplement increased on 10/15 10.  Ogilvie syndrome.    Resolved. 11.  Diastolic congestive heart failure.  Demadex 20 mg held due to hypokalemia.  Monitor for any signs of fluid overload Filed Weights   11/23/17 0421 11/24/17 0512 12/01/17 0343  Weight: 100 kg 100.1 kg 100.2 kg   12.  Hypertension.  Monitor with increased mobility Vitals:   11/30/17 1954 12/01/17 0343  BP: (!) 146/92 128/72  Pulse: 70 79  Resp: 18 19  Temp: 98.6 F (37 C) 98 F (36.7 C)  SpO2: 98% 97%   Relatively controlled on 10/16 13.  PAF.  Continue Eliquis.  Cardiac rate controlled 10/14 14.  Hypothyroidism.  Continue Synthroid 15.  CKD stage II.    Creatinine 1.06 on 10/15  Will order labs for Friday  Stable 16.  Hyperlipidemia.  Lipitor 17.  Hypoalbuminemia  Supplement initiated 18. ABLA  Hb 11.4 on 10/15  Cont to monitor  LOS (Days) 16 A FACE TO FACE EVALUATION WAS PERFORMED  Holly Flores AnLorie Phenix0/16/2019, 8:34 AM

## 2017-12-01 NOTE — Progress Notes (Signed)
Assessment Misty Stanley, RN Clin Instr.

## 2017-12-01 NOTE — Progress Notes (Signed)
Physical Therapy Session Note  Patient Details  Name: SHIKIRA FOLINO MRN: [id number] Date of Birth: 08-08-1945  Today's Date: 12/01/2017 PT Individual Time: 1000-1100 and 1545-1700 PT Individual Time Calculation (min): 60 min and 75 min  Short Term Goals: Week 3:  PT Short Term Goal 1 (Week 3): STG=LTG due to ELOS  Skilled Therapeutic Interventions/Progress Updates:    Session 1: Pt seated in w/c upon PT arrival, agreeable to therapy tx and denies pain. Pt transported to the gym total assist. Pt performed squat pivot transfer to mat mod assist with verbal cues for techniques. Pt performed lateral leans with min assist to don maxisky sling.This session focused on gait training with the maxi sky. Pt performed sit<>stand from w/c within maxi sky and worked on Conservation officer, nature without AD and pre-gait with hemi walker. Pt ambulated within maxi sky x20 ft with hemi walker, pt requiring manual facilitation for lateral weightshifting, able to advance R LE, assist for placement and knee block during stance- ambulated with shoe on L foot and sock/DF Ace wrap on right foot for increased foot clearance. Second trial x 20 ft with maxisky and hemiwalker, trial of AFO with increased difficulty advancing R LE during swing with added AFO and shoe. After gait training pt performed sit<>stand with HW and performed pre-gait in place with mod assist (no maxi sky) in order to assess for carry over. Pt transferred back to w/c with mod assist and transported back to room, left seated with needs in reach.   Session 2: Pt sitting in w/c upon PT arrival, agreeable to therapy tx and denies pain. Pt transported to gym in w/c. Pt worked on Personnel officer this session with [place] and w/c follow, shoe on L foot, sock on R foot and DF ace wrap. Pt ambulated x 10 ft with mod assist and w/c follow, pt able to advance R LE, therapist assisting with placement, pt demonstrates improved knee control during stance with less buckling than  previous sessions. Pt transferred from w/c>mat with mod assist and sit>supine>sidelying with mod assist. Pt worked on R LE neuro re-ed with use of powder board for gravity eliminated AAROM of hip flexion, hip extension, knee extension and knee flexion. Pt transferred to sitting edge of mat with mod assist. Pt performed squat pivot to w/c with mod assist, verbal cues for techniques. Pt ambulated x 20 ft with HW and mod-max assist, +2 for w/c follow for safety, verbal cues for sequencing/weightshifting, assist to advance and stabilize R LE. Pt attempted to performed pre-gait stepping with R LE with shoe/ace wrap on, pt unable to clear foot. Pt worked on standing balance without UE support to toss horseshoes, x 2 trials with min assist and verbal cues for upright posture. Pt performs sit<>stands throughout session from w/c using arm rest to push up, mod assist, emphasis on eccentric control with sitting. Pt transported back to room and left seated in w/c with needs in reach.    Therapy Documentation Precautions:  Precautions Precautions: Fall Precaution Comments: R hemi, R inattention Restrictions Weight Bearing Restrictions: No    Therapy/Group: Individual Therapy  Netta Corrigan, PT, DPT 12/01/2017, 7:59 AM

## 2017-12-01 NOTE — Progress Notes (Signed)
Occupational Therapy Weekly Progress Note  Patient Details  Name: Holly Flores MRN: [id number] Date of Birth: [birth year]  Beginning of progress report period: November 24, 2017 End of progress report period: December 01, 2017  Today's Date: 12/01/2017 OT Individual Time:1130-1200 and 2119-4174 OT Individual Time Calculation (min): 30 min and 48 min    Patient has met 1 of 3 short term goals.  Pt is progressing well towards her remaining 2 STGs. Though Pt is progressing slowly, she is very motivated to work with therapy to return to PLOF. Pt is currently completing functional transfers with overall modA, intermittent maxA. Overall pt is able to perform UB/LB ADLs with modA using AE PRN.   Patient continues to demonstrate the following deficits: muscle weakness and decreased standing balance, decreased postural control, hemiplegia and decreased balance strategies and therefore will continue to benefit from skilled OT intervention to enhance overall performance with BADL and Reduce care partner burden.  Patient not progressing toward long term goals.  See goal revision..  Plan of care revisions: Downgraded to overall modA goals. .  OT Short Term Goals Week 3:  OT Short Term Goal 1 (Week 3): STG=LTG d/t ELOS  Skilled Therapeutic Interventions/Progress Updates:    Session 1: Pt presents sitting up in w/c agreeable to therapy session, no c/o pain during session. Initial focus of session on practicing functional bathroom transfers. Pt reports she has BSC at home, elevated toilet, and grab bar on L side of toilet. Pt slightly unsure of complete setup of bathroom/shower so requested pt ask family member to take photos to bring to family ed next week. Pt performing toilet transfer using grab bar and BSC over toilet, transferring to the R and L with mod-maxA throughout and R knee blocked. Pt performing well with use of LUE supported either on grab bar or w/c arm during transitions. Discussed potential  use of BSC over toilet at home for increased availability of UE support. Will continue to review/practice. Transported pt to ADL apartment and discussed shower setup. Pt reports she has walk-in shower with very small lip, reports she has shower seat (unsure whether built in or portable), and grab bars. Discussed potential use of slide board for shower transfers at home, though unable to practice this session due to time constraints. Pt returned to room via w/c and assisted with setup for lunch. Pt left seated with chair alarm set, call bell and needs within reach.  Session 2: Pt presents up in w/c agreeable to session, no c/o pain. Focus of session on RUE NMR. Use of E-stim this session initially on wrist extensors at level 30 x10 min. Transitioned to elbow flexors with channel set on level 25 x13 min. Pt able to demonstrate gross wrist/digit extension and elbow flexion with both, no adverse reactions noted with use of e-stim. Also performed gentle stretching/PROM to RUE in wrist, digits, elbow, forearm and shoulder (0-90*). Reapplied kineostape to RUE wrist/digits for continued edema management, noted improvements/decreased edema from earlier this week with use of taping. Pt returned to room where she was left seated in w/c with call bell and needs met.   Therapy Documentation Precautions:  Precautions Precautions: Fall Precaution Comments: R hemi, R inattention Restrictions Weight Bearing Restrictions: No   Therapy/Group: Individual Therapy  Raymondo Band 12/01/2017, 7:50 AM

## 2017-12-02 ENCOUNTER — Inpatient Hospital Stay (HOSPITAL_COMMUNITY): Payer: Medicare Other | Admitting: Physical Therapy

## 2017-12-02 ENCOUNTER — Inpatient Hospital Stay (HOSPITAL_COMMUNITY): Payer: Medicare Other

## 2017-12-02 NOTE — Progress Notes (Signed)
Occupational Therapy Session Note  Patient Details  Name: Holly Flores MRN: [id number] Date of Birth: [birth year]  Today's Date: 12/02/2017 OT Individual Time: 7078-6754 OT Individual Time Calculation (min): 75 min    Short Term Goals: Week 3:  OT Short Term Goal 1 (Week 3): STG=LTG d/t ELOS  Skilled Therapeutic Interventions/Progress Updates:    OT intervention with focus on bed mobility, sitting balance, functional tranfsers, BADL retraining, RUE NMES, and RUE NMR to increase independence with BADLs.  Pt requires mod A for supine>sitting EOB but was able to maintain sitting balance at supervision level.  Pt declined LB bathing and dressing this morning but did request to take shower tomorrow. Pt required mod A for UB bathing/dressing tasks seated EOB.  Pt required mod A for squat pivot transfer with slide board to w/c.  Pt completed grooming tasks seated in w/c at sink.  Pt transitioned to gym.  NMES for RUE wrist and finger flexors for 10 mins at level 25 with trace activation.  Retrograde massage provided for edema management and kinesio tape reapplied to R hand. Pt remained in w/c and returned to room.  Half lap tray in place and all needs within reach.   Therapy Documentation Precautions:  Precautions Precautions: Fall Precaution Comments: R hemi, R inattention Restrictions Weight Bearing Restrictions: No    Pain: Pain Assessment Pain Scale: 0-10 Pain Score: 5  Pain Location: Leg Pain Orientation: Left Pain Descriptors / Indicators: Aching Patients Stated Pain Goal: 3 Pain Intervention(s): RN aware and repositioned   Therapy/Group: Individual Therapy  [name] 12/02/2017, 10:41 AM

## 2017-12-02 NOTE — Progress Notes (Signed)
Physical Therapy Session Note  Patient Details  Name: Holly Flores MRN: [id number] Date of Birth: [birth year]  Today's Date: 12/02/2017 PT Individual Time: 1000-1057 PT Individual Time Calculation (min): 57 min   Short Term Goals: Week 3:  PT Short Term Goal 1 (Week 3): STG=LTG due to ELOS  Skilled Therapeutic Interventions/Progress Updates:   Pt in w/c and agreeable to therapy, denies pain. Total assist w/c transport to/from therapy gym. Worked on blocked practice of lateral scoot transfers w/ slide board in both directions and w/ slightly uneven surface. Min manual assist needed at most w/ verbal and tactile cues for head/hips relationship and weight shifting. Discussed performing these transfers w/ nursing for increased practice. Put drop arm commode in room for slide board transfers and made RN aware. Performed multiple sit<>stands from mat w/ min assist to hemiwalker and performed R forward and backwards stepping w/ various strategies to facilitate increased R foot clearance including adding height to L shoe, using DF ace wrap on RLE, and taking off R shoe to decrease weight. Pt able to minimally initiate movement, ultimately requiring max-total assist to advance RLE w/ max manual assist for L weight shifting. Worked on hip flexion in gravity-minimized position, supine knees-to-chest and sidelying hip flexion from neutral position. More successful w/ muscle activation in sidelying. Returned to room and ended session in w/c, all needs in reach.   Therapy Documentation Precautions:  Precautions Precautions: Fall Precaution Comments: R hemi, R inattention Restrictions Weight Bearing Restrictions: No Pain: Pain Assessment Pain Scale: 0-10 Pain Score: 5  Pain Location: Leg Pain Orientation: Left Pain Descriptors / Indicators: Aching Patients Stated Pain Goal: 3 Pain Intervention(s): Medication (See eMAR)  Therapy/Group: Individual Therapy  Raffi Milstein K Aerith Canal 12/02/2017, 11:30 AM

## 2017-12-02 NOTE — Progress Notes (Signed)
Physical Therapy Session Note  Patient Details  Name: Holly Flores MRN: [id number] Date of Birth: 02-02-1946  Today's Date: 12/02/2017 PT Individual Time: 1430-1530 PT Individual Time Calculation (min): 60 min   Short Term Goals: Week 3:  PT Short Term Goal 1 (Week 3): STG=LTG due to ELOS  Skilled Therapeutic Interventions/Progress Updates: Pt received in w/c, denies pain and agreeable to treatment. Transferred via w/c throughout session totalA for energy conservation. Lateral scoot transfer minA >mat table with transfer board. Sit >stand with HW x3 trials with LLE forward/backward steps with tactile cues/facilitation at R knee for stance control, improved with cueing. LLE toe taps to 3" step with HW, min/modA at R knee. Gait three muskateers x30' with maxA; pushing to R side with lack of L weight shift for R foot clearance. Sitting balance with LUE reaching up/laterally to facilitate R trunk activation; wedge placed under R hip for R trunk shortening and neutral pelvis. Wedge added under w/c cushion as well for sustained positioning throughout the day. Stand pivot transfer w/c <>mat table with LUE support on w/c arm rest both directions; then carried trial over into slideboard transfers and discussed importance of partial sit >stand to clear hips, L lateral lean/weight shift to unweigh hips. Stand pivot w/c <>kinetron with modA. Sit>stand from [place] with RLE extended, LLE elevated to inhibit L pushing; able to demo activation in hip/knee extension and tactile cues for R hip extension in full stance. Pt able to briefly remove LUE from rail for full weight bearing through RLE. Returned to room totalA; remained in w/c at end of session, all needs in reach.      Therapy Documentation Precautions:  Precautions Precautions: Fall Precaution Comments: R hemi, R inattention Restrictions Weight Bearing Restrictions: No    Therapy/Group: Individual Therapy  Corliss Skains 12/02/2017,  3:52 PM

## 2017-12-02 NOTE — Progress Notes (Signed)
Subjective/Complaints: Patient seen laying in bed this morning.  She states she slept well overnight.  She notes some left lower extremity soreness.  ROS: Denies CP, SOB, N/V/D  Objective: Vital Signs: Blood pressure 132/72, pulse 90, temperature 98 F (36.7 C), temperature source Oral, resp. rate 18, height '5\' 1"'  (1.549 m), weight 100.2 kg, SpO2 94 %. No results found. Results for orders placed or performed during the hospital encounter of 11/15/17 (from the past 72 hour(s))  Basic metabolic panel     Status: Abnormal   Collection Time: 11/30/17  6:02 AM  Result Value Ref Range   Sodium 141 135 - 145 mmol/L   Potassium 3.4 (L) 3.5 - 5.1 mmol/L   Chloride 111 98 - 111 mmol/L   CO2 23 22 - 32 mmol/L   Glucose, Bld 101 (H) 70 - 99 mg/dL   BUN 13 8 - 23 mg/dL   Creatinine, Ser 1.06 (H) 0.44 - 1.00 mg/dL   Calcium 9.3 8.9 - 10.3 mg/dL   GFR calc non Af Amer 52 (L) >60 mL/min   GFR calc Af Amer 60 (L) >60 mL/min    Comment: (NOTE) The eGFR has been calculated using the CKD EPI equation. This calculation has not been validated in all clinical situations. eGFR's persistently <60 mL/min signify possible Chronic Kidney Disease.    Anion gap 7 5 - 15    Comment: Performed at [facility] [address], [place], [place] [address]  CBC with Differential/Platelet     Status: Abnormal   Collection Time: 11/30/17  6:02 AM  Result Value Ref Range   WBC 7.1 4.0 - 10.5 K/uL   RBC 3.84 (L) 3.87 - 5.11 MIL/uL   Hemoglobin 11.4 (L) 12.0 - 15.0 g/dL   HCT 35.8 (L) 36.0 - 46.0 %   MCV 93.2 80.0 - 100.0 fL   MCH 29.7 26.0 - 34.0 pg   MCHC 31.8 30.0 - 36.0 g/dL   RDW 13.6 11.5 - 15.5 %   Platelets 208 150 - 400 K/uL   nRBC 0.0 0.0 - 0.2 %   Neutrophils Relative % 38 %   Neutro Abs 2.7 1.7 - 7.7 K/uL   Lymphocytes Relative 48 %   Lymphs Abs 3.4 0.7 - 4.0 K/uL   Monocytes Relative 7 %   Monocytes Absolute 0.5 0.1 - 1.0 K/uL   Eosinophils Relative 7 %   Eosinophils Absolute 0.5 0.0  - 0.5 K/uL   Basophils Relative 0 %   Basophils Absolute 0.0 0.0 - 0.1 K/uL   Immature Granulocytes 0 %   Abs Immature Granulocytes 0.02 0.00 - 0.07 K/uL    Comment: Performed at [facility], [address] [place], [place] [address]     Constitutional: No distress . Vital signs reviewed. HENT: Normocephalic.  Atraumatic. Eyes: EOMI. No discharge. Cardiovascular: RRR.  No JVD. Respiratory: CTA bilaterally. Normal effort. GI: BS +.  Nondistended Musc: RUE/RLE edema  Neuro: Alert/Oriented Motor: 0/5 in RUE/RLE, stable Increased tone in right finger flexors and ankle plantar flexors 4-4+/5 LUE/LLE Dysarthria Skin:   Intact.  Warm and dry.  Assessment/Plan: 1. Functional deficits secondary to Right hemiparesis, left subcortical infarct which require 3+ hours per day of interdisciplinary therapy in a comprehensive inpatient rehab setting. Physiatrist is providing close team supervision and 24 hour management of active medical problems listed below. Physiatrist and rehab team continue to assess barriers to discharge/monitor patient progress toward functional and medical goals. FIM:  Medical Problem List and Plan: 1.  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  Cont CIR   WHO/PRAFO  2.  DVT Prophylaxis/Anticoagulation: Chronic Eliquis has been resumed. 3. Pain Management: Tylenol as needed  Chronic migraines, treated with topamax 134m BID as outpt per PCP, restarted at 221mBID  Topamax 25 mg twice daily   Controlled on 10/17 4. Mood: Xanax 1 mg nightly 5. Neuropsych: This patient is?  Fully capable of making decisions on her own behalf. 6. Skin/Wound Care: Routine skin checks 7.  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  Advance diet as tolerated 9.  Hypokalemia.      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 11.  Diastolic congestive heart failure.  Demadex 20 mg held due to hypokalemia.  Monitor for any signs of fluid overload Filed Weights   11/23/17 0421 11/24/17 0512 12/01/17 0343  Weight: 100 kg 100.1 kg 100.2 kg   12.  Hypertension.  Monitor with increased mobility Vitals:   12/01/17 1946 12/02/17 0401  BP: (!) 143/79 132/72  Pulse: 65 90  Resp: 18 18  Temp: 98.6 F (37 C) 98 F (36.7 C)  SpO2: 99% 94%   Relatively controlled on 10/17 13.  PAF.  Continue Eliquis.  Cardiac rate controlled 10/14 14.  Hypothyroidism.  Continue Synthroid 15.  CKD stage II.    Creatinine 1.06 on 10/15  Labs ordered for tomorrow  Stable 16.  Hyperlipidemia.  Lipitor 17.  Hypoalbuminemia  Supplement initiated 18. ABLA  Hb 11.4 on 10/15  Cont to monitor  LOS (Days) 17 A FACE TO FACE EVALUATION WAS PERFORMED  Corydon Schweiss AnLorie Phenix0/17/2019, 8:50 AM

## 2017-12-03 ENCOUNTER — Inpatient Hospital Stay (HOSPITAL_COMMUNITY): Payer: Medicare Other

## 2017-12-03 ENCOUNTER — Inpatient Hospital Stay (HOSPITAL_COMMUNITY): Payer: Medicare Other | Admitting: Physical Therapy

## 2017-12-03 LAB — BASIC METABOLIC PANEL
ANION GAP: 5 (ref 5–15)
BUN: 10 mg/dL (ref 8–23)
CO2: 22 mmol/L (ref 22–32)
Calcium: 8.9 mg/dL (ref 8.9–10.3)
Chloride: 112 mmol/L — ABNORMAL HIGH (ref 98–111)
Creatinine, Ser: 1.2 mg/dL — ABNORMAL HIGH (ref 0.44–1.00)
GFR, EST AFRICAN AMERICAN: 51 mL/min — AB (ref >60)
GFR, EST NON AFRICAN AMERICAN: 44 mL/min — AB (ref >60)
GLUCOSE: 103 mg/dL — AB (ref 70–99)
POTASSIUM: 3.5 mmol/L (ref 3.5–5.1)
Sodium: 139 mmol/L (ref 135–145)

## 2017-12-03 NOTE — Progress Notes (Signed)
Subjective/Complaints: Patient seen laying in bed this morning.  She states she slept well overnight.  ROS: Denies CP, SOB, N/V/D  Objective: Vital Signs: Blood pressure 131/76, pulse 79, temperature 97.8 F (36.6 C), temperature source Oral, resp. rate 20, height _0  (1.549 m), weight 100.2 kg, SpO2 97 %. No results found. Results for orders placed or performed during the hospital encounter of 11/15/17 (from the past 72 hour(s))  Basic metabolic panel     Status: Abnormal   Collection Time: 12/03/17  5:17 AM  Result Value Ref Range   Sodium 139 135 - 145 mmol/L   Potassium 3.5 3.5 - 5.1 mmol/L   Chloride 112 (H) 98 - 111 mmol/L   CO2 22 22 - 32 mmol/L   Glucose, Bld 103 (H) 70 - 99 mg/dL   BUN 10 8 - 23 mg/dL   Creatinine, Ser 1.20 (H) 0.44 - 1.00 mg/dL   Calcium 8.9 8.9 - 10.3 mg/dL   GFR calc non Af Amer 44 (L) >60 mL/min   GFR calc Af Amer 51 (L) >60 mL/min    Comment: (NOTE) The eGFR has been calculated using the CKD EPI equation. This calculation has not been validated in all clinical situations. eGFR's persistently <60 mL/min signify possible Chronic Kidney Disease.    Anion gap 5 5 - 15    Comment: Performed at [facility] [address] [place], [place] [address]     Constitutional: No distress . Vital signs reviewed. HENT: Normocephalic.  Atraumatic. Eyes: EOMI. No discharge. Cardiovascular: RRR.  No JVD. Respiratory: CTA bilaterally.  Normal effort. GI: BS +.  Nondistended Musc: RUE/RLE edema  Neuro: Alert/Oriented Motor: 0/5 in RUE/RLE, unchanged Increased tone in right finger flexors and ankle plantar flexors, unchanged Dysarthria Skin:   Intact.  Warm and dry.  Assessment/Plan: 1. Functional deficits secondary to Right hemiparesis, left subcortical infarct which require 3+ hours per day of interdisciplinary therapy in a comprehensive inpatient rehab setting. Physiatrist is providing close team supervision and 24 hour management of active  medical problems listed below. Physiatrist and rehab team continue to assess barriers to discharge/monitor patient progress toward functional and medical goals. FIM:                                  Medical Problem List and Plan: 1.  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  Cont CIR   WHO/PRAFO  2.  DVT Prophylaxis/Anticoagulation: Chronic Eliquis has been resumed. 3. Pain Management: Tylenol as needed  Chronic migraines, treated with topamax 117m BID as outpt per PCP, restarted at 236mBID  Topamax 25 mg twice daily   Controlled on 10/18 4. Mood: Xanax 1 mg nightly 5. Neuropsych: This patient is?  Fully capable of making decisions on her own behalf. 6. Skin/Wound Care: Routine skin checks 7. 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  Advance diet as tolerated 9.  Hypokalemia.      Potassium 3.5 on 10/18  Supplement increased on 10/15 10.  Ogilvie syndrome.    Resolved. 11.  Diastolic congestive heart failure.  Demadex 20 mg held due to hypokalemia.  Monitor for any signs of fluid overload [employer]  11/23/17 0421 11/24/17 0512 12/01/17 0343  Weight: 100 kg 100.1 kg 100.2 kg   Stable on 10/16 12.  Hypertension.  Monitor with increased mobility Vitals:   12/02/17 2015 12/03/17 0557  BP: (!) 145/79 131/76  Pulse: 60 79  Resp: 16 20  Temp: 98.2 F (36.8 C) 97.8 F (36.6 C)  SpO2: 99% 97%   Relatively controlled on 10/18 13.  PAF.  Continue Eliquis.  Cardiac rate controlled 10/18 14.  Hypothyroidism.  Continue Synthroid 15.  CKD stage III.    Creatinine 1.20 on 10/18  Stable 16.  Hyperlipidemia.  Lipitor 17.  Hypoalbuminemia  Supplement initiated 18.  ABLA  Hb 11.4 on 10/15  Labs ordered for Monday  Cont to monitor  LOS (Days) 18 A FACE TO FACE EVALUATION WAS PERFORMED  Demarie Hyneman Lorie Phenix 12/03/2017, 10:15 AM

## 2017-12-03 NOTE — Progress Notes (Signed)
Physical Therapy Session Note  Patient Details  Name: Holly Flores MRN: [id number] Date of Birth: 1945/05/19  Today's Date: 12/03/2017 PT Individual Time: 0923-3007 PT Individual Time Calculation (min): 80 min   Short Term Goals: Week 3:  PT Short Term Goal 1 (Week 3): STG=LTG due to ELOS  Skilled Therapeutic Interventions/Progress Updates:   Pt in w/c and agreeable to therapy, no c/o pain. Total assist w/c management to/from therapy gym. Performed NuStep 10 min @ level 3 for LE reciprocal movement pattern and RLE muscle activation. Performed w/ LEs only and min tactile and verbal cues to maintain neutral RLE alignment and to activate quad musculature. Min assist transfer to/from mat. Worked on sit<>stands from mat surface w/o UE support to stabilize in stance, fading to no UE support to push up at all w/ manual facilitation of RLE WB. Mirror visual feedback for midline orientation and WB symmetry. Manual cues for R quad and glut activation when powering up to stance. Added in 2" step under LLE to facilitate increased RLE weight-bearing. With each stand, incorporated lateral weight shifting w/o UE support w/ emphasis on R quad activation and maintaining R hip extension w/ R weight acceptance. Tactile cues for posture throughout. Pre-gait tasks w/ hemiwalker on L, including L forward and backward stepping on both flat surface and 2" step, as well as L lateral weight shifting. Returned to room via w/c, ended session in w/c and all needs in reach. Adjusted leg rests of pt's w/c from home for more appropriate fit, will assess for fit next session.   Therapy Documentation Precautions:  Precautions Precautions: Fall Precaution Comments: R hemi, R inattention Restrictions Weight Bearing Restrictions: No Pain: Pain Assessment Pain Scale: 0-10 Pain Score: 0-No pain  Therapy/Group: Individual Therapy  Jontae Sonier Clent Demark 12/03/2017, 12:46 PM

## 2017-12-03 NOTE — Progress Notes (Signed)
Occupational Therapy Session Note  Patient Details  Name: ELIANIE HUBERS MRN: [id number] Date of Birth: [birth year]  Today's Date: 12/03/2017 OT Individual Time: 0800-0900 OT Individual Time Calculation (min): 60 min    Short Term Goals: Week 3:  OT Short Term Goal 1 (Week 3): STG=LTG d/t ELOS  Skilled Therapeutic Interventions/Progress Updates:    OT intervention with focus on bathing at shower level, dressing with sit<>stand in [place], dressing tasks, activity tolerance, sitting balance, standing balance, and safety awareness to increase independence with BADLs. Pt transferred to shower bench with Stedy 2/2 bathroom configuration.  Pt uses long handle sponge to assist with bathing tasks.  Pt performs sit<>stand with Stedy and min A.  Pt requires assistance for bathing buttocks.  Pt will not be able access bathroom at home and will bathe at bed level or sink.  Pt will not be able to access bedroom at home and will require hospital bed. Pt remained in w/c with half lap tray in place and family present.  All needs within reach.   Therapy Documentation Precautions:  Precautions Precautions: Fall Precaution Comments: R hemi, R inattention Restrictions Weight Bearing Restrictions: No   Pain:  Pt denies pain       Therapy/Group: Individual Therapy  [name] 12/03/2017, 10:25 AM

## 2017-12-03 NOTE — Plan of Care (Signed)
  Problem: Consults Goal: RH STROKE PATIENT EDUCATION Description See Patient Education module for education specifics  Outcome: Progressing   Problem: RH BOWEL ELIMINATION Goal: RH STG MANAGE BOWEL WITH ASSISTANCE Description STG Manage Bowel with Mod Assistance.   Outcome: Progressing Goal: RH STG MANAGE BOWEL W/MEDICATION W/ASSISTANCE Description STG Manage Bowel with Medication with mod Assistance.   Outcome: Progressing   Problem: RH BLADDER ELIMINATION Goal: RH STG MANAGE BLADDER WITH ASSISTANCE Description STG Manage Bladder With Mod Assistance   Outcome: Progressing   Problem: RH PAIN MANAGEMENT Goal: RH STG PAIN MANAGED AT OR BELOW PT'S PAIN GOAL Description <4 out of 10.   Outcome: Progressing

## 2017-12-03 NOTE — Progress Notes (Signed)
Social Work Patient ID: Holly Flores, female   DOB: 1945/11/13, 72 y.o.   MRN: [id number]   CSW met with pt 12-01-17 and then spoke with pt's dtr, Caryl Pina, on 12-02-17.  She is prepared for pt's d/c on 12-08-17 and she will come for family education on 12-03-17. CSW will order f/u therapies and DME.  Pt is pleased to be going home.  CSW will continue to follow and assist as needed.

## 2017-12-03 NOTE — Patient Care Conference (Signed)
Inpatient RehabilitationTeam Conference and Plan of Care Update Date: 12/01/2017   Time: 11:10 AM    Patient Name: Holly Flores      Medical Record Number: [id number]  Date of Birth: 23-Jun-1945 Sex: Female         Room/Bed: 4W09C/4W09C-01 Payor Info: Payor: MEDICARE / Plan: MEDICARE PART A AND B / Product Type: *No Product type* /    Admitting Diagnosis: CVA  Admit Date/Time:  11/15/2017  5:26 PM Admission Comments: No comment available   Primary Diagnosis:  <principal problem not specified> Principal Problem: <principal problem not specified>  Patient Active Problem List   Diagnosis Date Noted  . Stage 2 chronic kidney disease   . Benign essential HTN   . Acute blood loss anemia   . Chronic kidney disease (CKD), stage III (moderate) (HCC)   . Labile blood pressure   . Hypoalbuminemia due to protein-calorie malnutrition ([place])   . Hypokalemia   . Intractable migraine without status migrainosus   . Reactive depression   . Abdominal distention   . Ileus ([place])   . 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 Hemiparesis affecting right side as late effect of stroke ([place])   . Abdominal distension   . Acute kidney injury superimposed on chronic kidney disease ([place])   . 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 Acute CVA (cerebrovascular accident) ([place])   . Slow transit constipation   . CKD (chronic kidney disease), stage II   . PAF (paroxysmal atrial fibrillation) ([place])   . Dysphasia, post-stroke   . 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 COPD (chronic obstructive pulmonary disease) ([place]) 10/29/2017  . Chronic diastolic CHF (congestive heart failure) ([place]) 10/29/2017  . Hypothyroidism 10/29/2017  . Essential hypertension 10/29/2017  . GERD (gastroesophageal reflux disease) 10/29/2017  . Anxiety 10/29/2017  . Taking medication for chronic disease 05/18/2017  . Central retinal vein occlusion of left eye 01/22/2015  . Degenerative disc disease, cervical 01/22/2015  . HA  (headache) 12/30/2014  . CVA (cerebral vascular accident) ([place]) 12/30/2014  . CVA (cerebral infarction) 12/30/2014  . History of colonic polyps 05/14/2014  . 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 Chest pain 04/20/2013  . Dyspnea 04/20/2013  . Hyperlipidemia 04/20/2013    Expected Discharge Date: Expected Discharge Date: 12/08/17  Team Members Present: Physician leading conference: Dr. 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 with recommendations of annual MRA/CTA  Improve mobility, HTN, potassium, headaches  See above   Bowel/Bladder   Continent of bowel and blabber; LBM 11/30/17  Remain continent  Assess bowel and bladder needs q shift and PRN   Swallow/Nutrition/ Hydration             ADL's   modA transfers; modA UB/LB bating; modA UB dressing; mod-maxA LB dressing, totalA footwear  contact guard A overall with transfers, ADLs - will most likely downgrade next weekly note   RUE NMR, NMR with Estim; ADL retraining, functional transfers; pt/family education    Mobility   min-mod assist for  bed mobility, Sit<>stand and transfers, max assist gait at rail, maxisky for gait with hemiwalker  min assist transfers, supervision w/c mobility, mod assist gait goal with PT only  R NMR, postural control, transfer, bed mobility, fam ed, d/c planning   Communication             Safety/Cognition/ Behavioral Observations            Pain   No c/o pain  Pain < 2/10  Assess pain q  shift and PRN and medicate as necessary   Skin   No skin issues  Remain free of infection and breakdown  Assess skin q shift and PRN and address any areas of concern    Rehab Goals Patient on target to meet rehab goals: No Rehab Goals Revised: some goals have been downgraded to mod A *See Care Plan and progress notes for long and short-term goals.     Barriers to Discharge  Current Status/Progress Possible Resolutions Date Resolved   Physician    Medical stability     See above  Therapies, optimize BP meds, follow labs      Nursing                  PT  Home environment access/layout;Weight                 OT                  SLP                SW                Discharge Planning/Teaching Needs:  Pt will return to her home where her dtr and other family members will assist with pt's care.  Husband is dealing with his own medical issues presently.  Family education 12-03-17   Team Discussion:  Pt's headaches are stable, as has her kidney disease and blood pressure.  Pt's muscle rub is nauseating to her, so RN requested MD to order a new one and he did .  Pt is continent of bowel and bladder and has no c/o pain.  Pt is making slow, steady progress, but is not getting return in her leg, but can activate her hip flexor and quad.  Pt can do gait in maxi sky and txs are mod A, trying to get her to min A.  Pt is limited by shoulder movement and ADL goals have been downgrade to mod A.   Revisions to Treatment Plan:  none    Continued Need for Acute Rehabilitation Level of Care: The patient requires daily medical management by a physician with specialized training in physical medicine and rehabilitation for the following conditions: Daily direction of a multidisciplinary physical rehabilitation program to ensure safe treatment while eliciting the highest outcome that is of practical value to the patient.: Yes Daily medical management of patient stability for increased activity during  participation in an intensive rehabilitation regime.: Yes Daily analysis of laboratory values and/or radiology reports with any subsequent need for medication adjustment of medical intervention for : Neurological problems;Other;Blood pressure problems   I attest that I was present, lead the team conference, and concur with the assessment and plan of the team.   Roxan Yamamoto, Silvestre Mesi 12/03/2017, 11:40 PM

## 2017-12-03 NOTE — Progress Notes (Signed)
Occupational Therapy Session Note  Patient Details  Name: FILIPPA YARBOUGH MRN: [id number] Date of Birth: Apr 21, 1945  Today's Date: 12/03/2017 OT Individual Time: 1300-1400 OT Individual Time Calculation (min): 60 min    Short Term Goals: Week 3:  OT Short Term Goal 1 (Week 3): STG=LTG d/t ELOS  Skilled Therapeutic Interventions/Progress Updates:    OT intervention with focus on w/c<>BSC transfers with sliding board.  Discussed placement of BSC at home for safety.  Pt verbalized understanding.  Pt practiced sliding board transfers X 3 w/c<>drop arm BSC.  Pt able to stand adequately to laterally shift buttocks along sliding board without sliding.  Pt also practiced standing from [facility] to facilitate care giver providing hygiene and clothing management.  Pt requires min A for sit<>stand and standing balance.  Recommend two persons assist with toileting tasks after discharge.  Pt returned to room and remained in w/c with all needs within reach and chair alarm activated.   Therapy Documentation Precautions:  Precautions Precautions: Fall Precaution Comments: R hemi, R inattention Restrictions Weight Bearing Restrictions: No Pain:  Pt denies pain   Therapy/Group: Individual Therapy  [name] 12/03/2017, 2:43 PM

## 2017-12-04 ENCOUNTER — Inpatient Hospital Stay (HOSPITAL_COMMUNITY): Payer: Medicare Other | Admitting: Occupational Therapy

## 2017-12-04 NOTE — Progress Notes (Signed)
Holly Flores is a 72 y.o. female 02/12/1946 [id number]  Subjective: Reports "liquid stools" with each BR trip, but no abdominal pain or nausea and vomiting. Poor sleep last night but resting well this AM. Still with poor motion in RUE and leg  Objective: Vital signs in last 24 hours: Temp:  [97.8 F (36.6 C)-98.7 F (37.1 C)] 98.1 F (36.7 C) (10/19 0514) Pulse Rate:  [71-72] 72 (10/19 0514) Resp:  [17-18] 18 (10/19 0514) BP: (110-137)/(56-79) 137/71 (10/19 0514) SpO2:  [97 %-99 %] 99 % (10/19 0514) Weight change:  Last BM Date: 12/03/17  Intake/Output from previous day: 10/18 0701 - 10/19 0700 In: 720 [P.O.:720] Out: -   Physical Exam General: No apparent distress    Lungs: Normal effort. Lungs clear to auscultation, no crackles or wheezes. Cardiovascular: Regular rate and rhythm, no edema Neurological: No new neurological deficits R foot PRAFO in place   Lab Results: BMET    Component Value Date/Time   NA 139 12/03/2017 0517   K 3.5 12/03/2017 0517   CL 112 (H) 12/03/2017 0517   CO2 22 12/03/2017 0517   GLUCOSE 103 (H) 12/03/2017 0517   BUN 10 12/03/2017 0517   CREATININE 1.20 (H) 12/03/2017 0517   CALCIUM 8.9 12/03/2017 0517   GFRNONAA 44 (L) 12/03/2017 0517   GFRAA 51 (L) 12/03/2017 0517   CBC    Component Value Date/Time   WBC 7.1 11/30/2017 0602   RBC 3.84 (L) 11/30/2017 0602   HGB 11.4 (L) 11/30/2017 0602   HCT 35.8 (L) 11/30/2017 0602   PLT 208 11/30/2017 0602   MCV 93.2 11/30/2017 0602   MCH 29.7 11/30/2017 0602   MCHC 31.8 11/30/2017 0602   RDW 13.6 11/30/2017 0602   LYMPHSABS 3.4 11/30/2017 0602   MONOABS 0.5 11/30/2017 0602   EOSABS 0.5 11/30/2017 0602   BASOSABS 0.0 11/30/2017 0602   CBG's (last 3):  No results for input(s): GLUCAP in the last 72 hours. LFT's Lab Results  Component Value Date   ALT 40 11/16/2017   AST 22 11/16/2017   ALKPHOS 86 11/16/2017   BILITOT 0.3 11/16/2017    Studies/Results: No results  found.  Medications:  I have reviewed the patient's current medications. Scheduled Medications: . ALPRAZolam  1 mg Oral QHS  . apixaban  5 mg Oral BID  . atorvastatin  80 mg Oral Daily  . levothyroxine  50 mcg Oral QAC breakfast  . pantoprazole  40 mg Oral Daily  . potassium chloride  30 mEq Oral BID  . protein supplement  1 scoop Oral TID WC  . topiramate  25 mg Oral BID   PRN Medications: acetaminophen, MUSCLE RUB, ondansetron **OR** ondansetron (ZOFRAN) IV, sorbitol  Assessment/Plan: Active Problems:   CVA (cerebral vascular accident) ([place])   Reactive depression   Hypoalbuminemia due to protein-calorie malnutrition (HCC)   Hypokalemia   Intractable migraine without status migrainosus   Chronic kidney disease (CKD), stage III (moderate) (HCC)   Labile blood pressure   Acute blood loss anemia   Stage 2 chronic kidney disease   Benign essential HTN   1.  Acute infarction left external capsule with microhemorrhage left frontal white matter and subsequent right-sided weakness with dysarthria and diplopia.  Continue inpatient rehabilitation for functional deficits related to same with ongoing PT and OT as prescribed.  Continue medical management of same. 2.  Chronic migraines.  Continue Topamax with pain medication as needed 3.  diarrhea.  Recent Ogilvie's reviewed.  We will plan to  recheck electrolytes on Monday given recent hypokalemia but resolved on last lab.  Afebrile and no signs for other aggravating medication.  Continue diet as tolerated with observation. 4.  Hypertension.  Continue medical management as ongoing. 5 hypothyroidism.  Continue supplement as prescribed. 6.  PAF.  Continue Eliquis  Length of stay, days: 19  [name], MD 12/04/2017, 11:03 AM

## 2017-12-04 NOTE — Progress Notes (Signed)
Occupational Therapy Session Note  Patient Details  Name: Holly Flores MRN: [id number] Date of Birth: February 28, 1945  Today's Date: 12/04/2017 OT Individual Time: 2707-8675 OT Individual Time Calculation (min): 58 min   Skilled Therapeutic Interventions/Progress Updates: R UE neuro re-education and PROM and AAROM.  Participated in self ROM exercises, though patient appeared to have difficulty mobilizing her hemiparesis right UE.    She did stated that some Self Range of motion being taught to her did prompt soreness in left hand joints.     Patient with very tight shoulder and scapular muscles.   This clinician assisted with scapular mobilization and toneous muscles.     Patient participated in sit to stand actitivitites at sink with cues for technique and this clinician blocked her left knee so as to not buckle.    She did work on lateral weight shifts safely as well.     She attempted w/c to recliner transfer, but c/o she was too fatigued and so instead she completed sliding board transfer to her hemiparesis side with max A.   She was assisted with safety position and elevation of her right swollen hand once in the chair.  Call bell and phone were placed within her reach.     Therapy Documentation Precautions:  Precautions Precautions: Fall Precaution Comments: R hemi, R inattention Restrictions Weight Bearing Restrictions: No  Pain:denied    Therapy/Group: Individual Therapy  Alfredia Ferguson [facility] 12/04/2017, 6:52 PM

## 2017-12-05 ENCOUNTER — Inpatient Hospital Stay (HOSPITAL_COMMUNITY): Payer: Medicare Other

## 2017-12-05 NOTE — Plan of Care (Signed)
  Problem: Consults Goal: RH STROKE PATIENT EDUCATION Description See Patient Education module for education specifics  Outcome: Progressing   Problem: RH BOWEL ELIMINATION Goal: RH STG MANAGE BOWEL WITH ASSISTANCE Description STG Manage Bowel with Mod Assistance.   Outcome: Progressing Goal: RH STG MANAGE BOWEL W/MEDICATION W/ASSISTANCE Description STG Manage Bowel with Medication with mod Assistance.   Outcome: Progressing   Problem: RH BLADDER ELIMINATION Goal: RH STG MANAGE BLADDER WITH ASSISTANCE Description STG Manage Bladder With Mod Assistance   Outcome: Progressing   Problem: RH PAIN MANAGEMENT Goal: RH STG PAIN MANAGED AT OR BELOW PT'S PAIN GOAL Description <4 out of 10.   Outcome: Progressing

## 2017-12-05 NOTE — Progress Notes (Signed)
Occupational Therapy Session Note  Patient Details  Name: Holly Flores MRN: [id number] Date of Birth: [birth year]  Today's Date: 12/05/2017 OT Individual Time: 0900-1000 OT Individual Time Calculation (min): 60 min    Short Term Goals: Week 3:  OT Short Term Goal 1 (Week 3): STG=LTG d/t ELOS  Skilled Therapeutic Interventions/Progress Updates:    OT intervention with focus on bathing/dressing with sit<>stand from w/c at sink, safety awareness, activity tolerance, and standing balance to increase independence with BADLs.  Pt resting in bed upon arrival and required min A for supine>sit EOB in preparation for slide board transfer to w/c.  Pt able to sit<>stand with min A for 3 scoots across slide board to w/c.  Pt completed bathing/dressing tasks with sit<>stand from w/c at sink.  Pt requires min A for sit<>stand at sink and min A for standing balance at sink with support provided at R knee when standing.  Pt requires mod A for bathing/dressing tasks.  Pt requires more than a reasonable amount of time to complete tasks.  Kinesio Tape reapplied to RUE/hand for edema management.   Therapy Documentation Precautions:  Precautions Precautions: Fall Precaution Comments: R hemi, R inattention Restrictions Weight Bearing Restrictions: No Pain: Pain Assessment Pain Scale: 0-10 Pain Score: (reports "better") Pain Type: Chronic pain Pain Location: Shoulder Pain Orientation: Right;Left Pain Descriptors / Indicators: Aching Pain Onset: On-going Pain Intervention(s): Medication (See eMAR), repositioned   Therapy/Group: Individual Therapy  [name] 12/05/2017, 10:48 AM

## 2017-12-05 NOTE — Progress Notes (Signed)
Holly Flores is a 72 y.o. female 01/05/1946 [id number]  Subjective: No new concerns - belly and bowels "ok" today - no n/v. No pain.   Objective: Vital signs in last 24 hours: Temp:  [98.2 F (36.8 C)-98.6 F (37 C)] 98.2 F (36.8 C) (10/20 0400) Pulse Rate:  [66-83] 83 (10/20 0400) Resp:  [16-18] 18 (10/20 0400) BP: (135-149)/(73-84) 149/73 (10/20 0400) SpO2:  [93 %-100 %] 93 % (10/20 0400) Weight change:  Last BM Date: 12/05/17  Intake/Output from previous day: 10/19 0701 - 10/20 0700 In: 840 [P.O.:840] Out: -   Physical Exam General: No apparent distress   In [facility] Lungs: Normal effort. Lungs clear to auscultation, no crackles or wheezes. Cardiovascular: Regular rate and rhythm, no edema Neurological: No new neurological deficits R foot PRAFO in place   Lab Results: BMET    Component Value Date/Time   NA 139 12/03/2017 0517   K 3.5 12/03/2017 0517   CL 112 (H) 12/03/2017 0517   CO2 22 12/03/2017 0517   GLUCOSE 103 (H) 12/03/2017 0517   BUN 10 12/03/2017 0517   CREATININE 1.20 (H) 12/03/2017 0517   CALCIUM 8.9 12/03/2017 0517   GFRNONAA 44 (L) 12/03/2017 0517   GFRAA 51 (L) 12/03/2017 0517   CBC    Component Value Date/Time   WBC 7.1 11/30/2017 0602   RBC 3.84 (L) 11/30/2017 0602   HGB 11.4 (L) 11/30/2017 0602   HCT 35.8 (L) 11/30/2017 0602   PLT 208 11/30/2017 0602   MCV 93.2 11/30/2017 0602   MCH 29.7 11/30/2017 0602   MCHC 31.8 11/30/2017 0602   RDW 13.6 11/30/2017 0602   LYMPHSABS 3.4 11/30/2017 0602   MONOABS 0.5 11/30/2017 0602   EOSABS 0.5 11/30/2017 0602   BASOSABS 0.0 11/30/2017 0602   CBG's (last 3):  No results for input(s): GLUCAP in the last 72 hours. LFT's Lab Results  Component Value Date   ALT 40 11/16/2017   AST 22 11/16/2017   ALKPHOS 86 11/16/2017   BILITOT 0.3 11/16/2017    Studies/Results: No results found.  Medications:  I have reviewed the patient's current medications. Scheduled Medications: . ALPRAZolam  1 mg  Oral QHS  . apixaban  5 mg Oral BID  . atorvastatin  80 mg Oral Daily  . levothyroxine  50 mcg Oral QAC breakfast  . pantoprazole  40 mg Oral Daily  . potassium chloride  30 mEq Oral BID  . protein supplement  1 scoop Oral TID WC  . topiramate  25 mg Oral BID   PRN Medications: acetaminophen, MUSCLE RUB, ondansetron **OR** ondansetron (ZOFRAN) IV, sorbitol  Assessment/Plan: Active Problems:   CVA (cerebral vascular accident) ([place])   Reactive depression   Hypoalbuminemia due to protein-calorie malnutrition (HCC)   Hypokalemia   Intractable migraine without status migrainosus   Chronic kidney disease (CKD), stage III (moderate) (HCC)   Labile blood pressure   Acute blood loss anemia   Stage 2 chronic kidney disease   Benign essential HTN   1.  Acute infarction left external capsule with microhemorrhage left frontal white matter and subsequent right-sided weakness with dysarthria and diplopia.  Continue inpatient rehabilitation for functional deficits related to same with ongoing PT and OT as prescribed.  Continue medical management of same. 2.  Chronic migraines.  Continue Topamax with pain medication as needed 3.  diarrhea.  Recent Ogilvie's reviewed.  We will plan to recheck electrolytes on Monday given recent hypokalemia but resolved on last lab.  Afebrile and no  signs for other aggravating medication.  Continue diet as tolerated with observation. 4.  Hypertension.  Continue medical management as ongoing. 5 hypothyroidism.  Continue supplement as prescribed. 6.  PAF.  Continue Eliquis  Length of stay, days: 20  Raliegh Scobie A. Asa Lente, MD 12/05/2017, 11:14 AM

## 2017-12-06 ENCOUNTER — Inpatient Hospital Stay (HOSPITAL_COMMUNITY): Payer: Medicare Other

## 2017-12-06 ENCOUNTER — Encounter (HOSPITAL_COMMUNITY): Payer: Medicare Other | Admitting: Occupational Therapy

## 2017-12-06 ENCOUNTER — Encounter (HOSPITAL_COMMUNITY): Payer: Medicare Other | Admitting: Psychology

## 2017-12-06 LAB — CBC WITH DIFFERENTIAL/PLATELET
Abs Immature Granulocytes: 0.02 10*3/uL (ref 0.00–0.07)
BASOS PCT: 1 %
Basophils Absolute: 0 10*3/uL (ref 0.0–0.1)
EOS ABS: 0.3 10*3/uL (ref 0.0–0.5)
Eosinophils Relative: 4 %
HEMATOCRIT: 39.2 % (ref 36.0–46.0)
Hemoglobin: 12.2 g/dL (ref 12.0–15.0)
Immature Granulocytes: 0 %
LYMPHS ABS: 4.2 10*3/uL — AB (ref 0.7–4.0)
Lymphocytes Relative: 48 %
MCH: 28.9 pg (ref 26.0–34.0)
MCHC: 31.1 g/dL (ref 30.0–36.0)
MCV: 92.9 fL (ref 80.0–100.0)
MONO ABS: 0.6 10*3/uL (ref 0.1–1.0)
MONOS PCT: 7 %
Neutro Abs: 3.5 10*3/uL (ref 1.7–7.7)
Neutrophils Relative %: 40 %
Platelets: 289 10*3/uL (ref 150–400)
RBC: 4.22 MIL/uL (ref 3.87–5.11)
RDW: 13.8 % (ref 11.5–15.5)
WBC: 8.8 10*3/uL (ref 4.0–10.5)
nRBC: 0 % (ref 0.0–0.2)

## 2017-12-06 LAB — BASIC METABOLIC PANEL
Anion gap: 4 — ABNORMAL LOW (ref 5–15)
BUN: 11 mg/dL (ref 8–23)
CALCIUM: 8.8 mg/dL — AB (ref 8.9–10.3)
CO2: 20 mmol/L — ABNORMAL LOW (ref 22–32)
CREATININE: 1.08 mg/dL — AB (ref 0.44–1.00)
Chloride: 115 mmol/L — ABNORMAL HIGH (ref 98–111)
GFR calc Af Amer: 58 mL/min — ABNORMAL LOW (ref >60)
GFR calc non Af Amer: 50 mL/min — ABNORMAL LOW (ref >60)
GLUCOSE: 97 mg/dL (ref 70–99)
Potassium: 3.6 mmol/L (ref 3.5–5.1)
Sodium: 139 mmol/L (ref 135–145)

## 2017-12-06 NOTE — Progress Notes (Signed)
Physical Therapy Session Note  Patient Details  Name: Holly Flores MRN: [id number] Date of Birth: 06-03-1945  Today's Date: 12/06/2017 PT Individual Time: 1115-1200 and 0539-7673 PT Individual Time Calculation (min): 45 min and 71 min  Short Term Goals: Week 3:  PT Short Term Goal 1 (Week 3): STG=LTG due to ELOS  Skilled Therapeutic Interventions/Progress Updates:    Session 1: Pts granddaughter present for family ed this session. Pt performed car transfer from w/c<>car with slideboard with therapist x 1 and with granddaughter x 2, mod assist and emphasis on clearing buttocks with scooting. Therapist providing education on techniques and appropriate cues to provide. Pt transported to the gym and performed x 2 squat pivot transfers w/c<>mat with therapist and x 2 squat pivot transfers w/c<>mat with assist from granddaughter, min-mod assist. Therapist educating pt and family on techniques/safety. Pt transported back to room and left seated in w/c with needs in reach.   Session 2: Pts granddaughter present for family ed this session. Session focused on stand pivot, squat pivot and slideboard transfers from w/c<>mat x 3, w/c<>car x 2 and w/c<>bedside commode x 2, grandaughter performing all transfers with pt while therapist provides education and cues for techniques. Therapist discussed having family bring in car tomorrow to perform a real car transfer prior to d/c. Pt ambulated x 10 ft with therapist only using HW and mod assist, R DF ace wrap/sock and L shoe. Orthotist present for consult. Pt ambulated x 5 ft with HW and B shoes, unable to clear R LE requiring assist for advance during swing.  Recommending toe cap and toe up shoe brace. Pt performed sit<>stands this session with min assist and HW for UE support in standing. Pt transported back to room and left seated with needs in reach.     Therapy Documentation Precautions:  Precautions Precautions: Fall Precaution Comments: R hemi, R  inattention Restrictions Weight Bearing Restrictions: No    Therapy/Group: Individual Therapy  Netta Corrigan, PT, DPT 12/06/2017, 8:01 AM

## 2017-12-06 NOTE — Consult Note (Signed)
Neuropsychological Consultation   Patient:   Holly Flores   DOB:   [birth year]  MR Number:  [id number]  Location:  [address] [place] [id number] [place] [address] Dept: [place]: [phone]           Date of Service:   12/06/2017  Start Time:   9 AM End Time:   10 AM  Provider/Observer:  Ilean Skill, Psy.D.       Clinical Neuropsychologist       Billing Code/Service: [phone] 4 Units  Chief Complaint:    Holly Flores is a 72 year old female with history of diastolic congestive hear failure, PAF, CKD stage II, COPD, hypertension, hyperlipidemia.  Presented 10/29/2017 to [facility] with right sided weakness, diplopia and difficulty speaking.  MRI showed left brain infarction.  Acute infarction of left external capsule posteriorly.  Chronic microhemorrhage left frontal white matter adjacent to head of the caudate.  Patient has been progressing in CIR program therapies with setback after developing diffuse abdominal discomfort on 11/08/2017.  Patient has returned to [facility] program for comprehensive rehab therapies.   The above chief complaint remains accurate for current visit.  The patient reports that her depressive symptoms continue to improve and she is looking forward to returning home in two days.  Reason for Service:  The patient was referred for neuropsychological consultation due to coping and adjustment issues.  She continues to report depressive symptoms  But dues feel like they are improving.  Below is the HPI for the current admission.    HPI: Holly Flores is a 72 year old right-handed African-American female with history of diastolic congestive heart failure, PAF maintained on Eliquis since July 2019 followed by Dr. Carlyle Dolly, CKD stage II, COPD, hypertension hyperlipidemia.  Per chart review and patient, patient lives with spouse.  Independent prior to admission and  driving.  One level home with 3 steps to entry.  Good support of extended family.  Husband is receiving treatment for leukemia at [facility] hospital but can assist.  Presented 10/29/2017 to [facility] with right sided weakness, diplopia and difficulty speaking.  Troponin negative.  MRI reviewed, showing left brain infarction.  Per CT and MRI report, acute infarction left external capsule posteriorly.  Chronic microhemorrhage left frontal white matter adjacent to the head of the caudate.  CT angiogram of head and neck with no large vessel occlusion.  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  Echocardiogram with ejection fraction of 65% no wall motion abnormalities without thrombus.  Patient did not receive TPA.  Follow-up neurology services.  Patient's chronic Eliquis initially held to avoid hemorrhagic transformation initially placed on aspirin and Eliquis later resumed 11/01/2017 and aspirin at that time discontinued.  She was on mechanical soft diet.  Patient was admitted to inpatient rehab services 11/01/2017.  Patient progressing slowly with therapies.  Developed  diffuse abdominal discomfort worse on left lower quadrant 11/08/2017.  KUB completed concerning for possible ileus.  Her diet was changed to full liquids.  Noted significant hypokalemia 2.7 received intravenous supplement.  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  C. difficile specimen negative.  CT of the abdomen 11/09/2017 showed fluid-filled distended colon with transverse colon measuring 13 cm.  No distal obstructing lesion noted.  There was an area of questionable narrowing of the splenic flexure of the colon.  Patient  was discharged to acute care services for further evaluation 11/09/2017.  Gastroenterology services consulted and advised conservative care fleets enema and twice a day MiraLAX was ordered.  Narrowing of splenic flexure most  likely peristalsis and monitored.  Hypokalemia improving 3.5 11/15/2017.  Patient remains on Eliquis for history of CVA/ PAF.  Latest abdominal film shows mild improvement.  Patient with no further bouts of nausea or vomiting.  Diet has been advanced to mechanical soft which was her diet prior to ileus.  Therapy evaluations completed with recommendations to resume inpatient rehab services.  Patient was admitted for a comprehensive rehab program.  Current Status:  The patient reports that her mood has continued to improve and that now that her family is coming in to see what they will need to do once patient gets home her mood has continued to improve.  The patient reports that she is doing better with stroke symptoms and that she is seeing symptoms improve each day.   Behavioral Observation: Holly Flores  presents as a 72 y.o.-year-oldold Right African American Female who appeared her stated age. her dress was Appropriate and she was Well Groomed and her manners were Appropriate to the situation.  her participation was indicative of Appropriate and Attentive behaviors.  There were any physical disabilities noted.  she displayed an appropriate level of cooperation and motivation.     Interactions:    Active Appropriate and Attentive  Attention:   within normal limits and attention span appeared shorter than expected for age  Memory:   abnormal; some issues with recall of information but with time and cueing she was able to retreive information  Visuo-spatial:  not examined  Speech (Volume):  low  Speech:   slurred; appeared to be some word finding issues.  Thought Process:  Coherent and Relevant  Though Content:  WNL; not suicidal and not homicidal  Orientation:   person, place, time/date and situation  Judgment:   Fair  Planning:   Fair  Affect:    Appropriate  Mood:    Euthymic  Insight:   Good  Intelligence:   normal  Medical History:   Past Medical History:  Diagnosis Date   . Anxiety   . Arthritis   . CHF (congestive heart failure) ([place])   . COPD (chronic obstructive pulmonary disease) ([place])   . Depression   . GERD (gastroesophageal reflux disease)   . Goiter   . Hyperlipidemia   . Hypertension   . Hypothyroidism   . Kidney cysts    left side  . Myocardial infarction ([place])    8 yrs ago.   . 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 Shortness of breath   . Sleep apnea    uses CPAP, 3  . Stroke ([place])   . Stroke ([place])    OCULAR  LEFT  EYE    LAST YR.    Psychiatric History:  Patient does have past history of some depression and anxiety, but she reports acute worsening of these symptoms.    Family Med/Psych History:  Family History  Problem Relation Age of Onset  . Cancer Other   . Diabetes Other   . Heart disease Other   . Hypertension Other   . Asthma Other   . Alcoholism Other   . Thyroid disease Other   . Rheumatologic disease Other   . Stroke Other   . Hypercholesterolemia Other   . Colon cancer Neg Hx     Risk of Suicide/Violence: low Patient denies SI  or HI.  Impression/DX:  Holly Flores is a 72 year old female with history of diastolic congestive hear failure, PAF, CKD stage II, COPD, hypertension, hyperlipidemia.  Presented 10/29/2017 to [facility] with right sided weakness, diplopia and difficulty speaking.  MRI showed left brain infarction.  Acute infarction of left external capsule posteriorly.  Chronic microhemorrhage left frontal white matter adjacent to head of the caudate.  Patient has been progressing in CIR program therapies with setback after developing diffuse abdominal discomfort on 11/08/2017.  Patient has returned to [facility] program for comprehensive rehab therapies.   The patient reports that she has improved but improving symptions of depression and worry.  She reports that she has continued to worry about her husband while she has been hospitalized.  Significant improvement in expressive language.   Disposition/Plan:  The patient is  being discharged on 12/08/2017.  If depressive symptoms reemerge after discharge I would see her again at outpatient.     Diagnosis:    Reactive Depression and Anxiety to current medical status         Electronically Signed   _______________________ Ilean Skill, Psy.D.

## 2017-12-06 NOTE — Progress Notes (Signed)
Occupational Therapy Session Note  Patient Details  Name: Holly Flores MRN: [id number] Date of Birth: [birth year]  Today's Date: 12/06/2017 OT Individual Time: 1000-1115 OT Individual Time Calculation (min): 75 min    Short Term Goals: Week 1:  OT Short Term Goal 1 (Week 1): Pt will don shirt with mod A sitting EOB OT Short Term Goal 1 - Progress (Week 1): Progressing toward goal OT Short Term Goal 2 (Week 1): Pt will complete sit to stand with mod A OT Short Term Goal 2 - Progress (Week 1): Met OT Short Term Goal 3 (Week 1): Pt will complete toilet transfer with max assist of one caregiver OT Short Term Goal 3 - Progress (Week 1): Progressing toward goal Week 2:  OT Short Term Goal 1 (Week 2): Pt will don shirt with mod A sitting EOB OT Short Term Goal 1 - Progress (Week 2): Progressing toward goal OT Short Term Goal 2 (Week 2): Pt will complete toilet transfer with mod assist of one caregiver OT Short Term Goal 2 - Progress (Week 2): Met OT Short Term Goal 3 (Week 2): Pt will don pants with modA.  OT Short Term Goal 3 - Progress (Week 2): Progressing toward goal Week 3:  OT Short Term Goal 1 (Week 3): STG=LTG d/t ELOS      Skilled Therapeutic Interventions/Progress Updates:    Pt seen this session for Family Education with pt's daughter and granddaughter with ADL training.  Pt's family reports there is a BR set up that will fit a w/c. She will need to transfer to her R side to [facility] over toilet with board.  Sink is on L side so she could use that for support in standing. Also have a tub renovated as a walk in shower.  She could then transfer from toilet to bench with board to her R side.  Requested to family to ask HHOT to work on the shower transfers 1st, before family attempts on their own.   Therapy session focused on mobility and transfers related to the ones she will need to do at home.   Pt sat to EOB with min A using elevated bed (pt has hospital bed at home).   From EOB (time  did not permit for shower transfer), pt worked on bathing using hemi techniques.  Used long sponge to reach under L arm and washcloth on thigh for forearm. Explained to family with demonstration and repeat demonstration from both family members how to facilitate sit to stand with L hand on hemiwalker.  Then practiced stabilizing her balance supporting her R side as she washed her perineal area.   Demonstrated hemidressing techniques with pt, although she continues to need mod A UB and max LB due to limited flaccid RUE, L shoulder restricted ROM and body habitus.   Demonstration with repeat demonstration from family, slide board bed to W/c to her R side.  Pt used slide board with only min A on a level surface.   Family discussing shower set up and how she could get in there easily. Took pt down to ADL apt to set up bathroom similar to home.  The transfer they thought she could do would not be safe as the w/c cannot be positioned close enough to the tub bench. She can use slide board to toilet to R side and then to edge of bench to her R. Unable to practice as her w/c seat is quite a bit lower than tub bench and she  cant do an up hill slide board.  Family believes their bench is lower.   Time did not allow for board transfers to drop arm BSC.  PT may be able to do that during the afternoon session.   PT arrived for pt's next session.   Therapy Documentation Precautions:  Precautions Precautions: Fall Precaution Comments: R hemi, R inattention Restrictions Weight Bearing Restrictions: No   Pain: Pain Assessment Pain Score: 0-No pain ADL: Refer to CARE TOOL     Therapy/Group: Individual Therapy  [place] 12/06/2017, 12:20 PM

## 2017-12-06 NOTE — Progress Notes (Signed)
  Subjective/Complaints:  No issues overnite, stomach still gurgles but no pain, having BM, denies HA  ROS: Denies CP, SOB, N/V/D  Objective: Vital Signs: Blood pressure (!) 143/79, pulse 84, temperature 98.2 F (36.8 C), temperature source Oral, resp. rate 18, height 5\' 1"  (1.549 m), weight 100.2 kg, SpO2 95 %. No results found. No results found for this or any previous visit (from the past 72 hour(s)).   Constitutional: No distress . Vital signs reviewed. HENT: Normocephalic.  Atraumatic. Eyes: EOMI. No discharge. Cardiovascular: RRR.  No JVD. Respiratory: CTA bilaterally.  Normal effort. GI: BS +.  Nondistended Musc: RUE/RLE edema  Neuro: Alert/Oriented Motor: 0/5 in RUE/RLE, unchanged Increased tone in right finger flexors and ankle plantar flexors, unchanged Dysarthria Skin:   Intact.  Warm and dry.  Assessment/Plan: 1. Functional deficits secondary to Right hemiparesis, left subcortical infarct which require 3+ hours per day of interdisciplinary therapy in a comprehensive inpatient rehab setting. Physiatrist is providing close team supervision and 24 hour management of active medical problems listed below. Physiatrist and rehab team continue to assess barriers to discharge/monitor patient progress toward functional and medical goals. FIM:                                  Medical Problem List and Plan: 1.  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  Cont CIR   WHO/PRAFO  2.  DVT Prophylaxis/Anticoagulation: Chronic Eliquis has been resumed. 3. Pain Management: Tylenol as needed  Chronic migraines, treated with topamax 100mg  BID as outpt per PCP, restarted at 25mg  BID  Topamax 25 mg twice daily   Controlled on 10/21 4. Mood:  Xanax 1 mg nightly 5. Neuropsych: This patient is?  Fully capable of making decisions on her own behalf. 6. Skin/Wound Care: Routine skin checks 7. 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  Advance diet as tolerated 9.  Hypokalemia.      Potassium 3.5 on 10/18  Supplement increased on 10/15- repeat BMET 10/21 10.  Ogilvie syndrome.    Resolved. 11.  Diastolic congestive heart failure.  Demadex 20 mg held due to hypokalemia.  Monitor for any signs of fluid overload Filed Weights   11/23/17 0421 11/24/17 0512 12/01/17 0343  Weight: 100 kg 100.1 kg 100.2 kg   Stable on 10/21 12.  Hypertension.  Monitor with increased mobility Vitals:   12/05/17 2057 12/06/17 0314  BP: (!) 148/74 (!) 143/79  Pulse: 71 84  Resp: 18 18  Temp: 98.2 F (36.8 C) 98.2 F (36.8 C)  SpO2: 94% 95%   Relatively controlled on 10/21 13.  PAF.  Continue Eliquis.  Cardiac rate controlled 10/21 14.  Hypothyroidism.  Continue Synthroid 15.  CKD stage III.    Creatinine 1.20 on 10/18, repeat 10/21 pnd  Stable 16.  Hyperlipidemia.  Lipitor 17.  Hypoalbuminemia  Supplement initiated 18. ABLA  Hb 11.4 on 10/15  Repeat pnd 10/21  Cont to monitor  LOS (Days) 21 A FACE TO FACE EVALUATION WAS PERFORMED  Charlett Blake 12/06/2017, 7:14 AM

## 2017-12-07 ENCOUNTER — Inpatient Hospital Stay (HOSPITAL_COMMUNITY): Payer: Medicare Other

## 2017-12-07 ENCOUNTER — Inpatient Hospital Stay (HOSPITAL_COMMUNITY): Payer: Self-pay | Admitting: Physical Therapy

## 2017-12-07 ENCOUNTER — Inpatient Hospital Stay (HOSPITAL_COMMUNITY): Payer: Self-pay

## 2017-12-07 MED ORDER — CYCLOBENZAPRINE HCL 5 MG PO TABS
5.0000 mg | ORAL_TABLET | Freq: Every day | ORAL | Status: DC
  Administered 2017-12-07: 5 mg via ORAL
  Filled 2017-12-07: qty 1

## 2017-12-07 NOTE — Plan of Care (Signed)
  Problem: Consults Goal: RH STROKE PATIENT EDUCATION Description See Patient Education module for education specifics  Outcome: Progressing   Problem: RH BOWEL ELIMINATION Goal: RH STG MANAGE BOWEL WITH ASSISTANCE Description STG Manage Bowel with Mod Assistance.   Outcome: Progressing Goal: RH STG MANAGE BOWEL W/MEDICATION W/ASSISTANCE Description STG Manage Bowel with Medication with mod Assistance.   Outcome: Progressing   Problem: RH BLADDER ELIMINATION Goal: RH STG MANAGE BLADDER WITH ASSISTANCE Description STG Manage Bladder With Mod Assistance   Outcome: Progressing   Problem: RH PAIN MANAGEMENT Goal: RH STG PAIN MANAGED AT OR BELOW PT'S PAIN GOAL Description <4 out of 10.   Outcome: Progressing

## 2017-12-07 NOTE — Progress Notes (Signed)
Physical Therapy Discharge Summary  Patient Details  Name: Holly Flores MRN: [id number] Date of Birth: January 28, 1946    Today's Date: 12/08/2017 PT Individual Time: 1015-1040 PT Individual Time Calculation (min): 25 min   Patient has met 5 of 7 long term goals due to improved activity tolerance, improved balance, improved postural control, increased strength, ability to compensate for deficits, functional use of  right lower extremity and improved coordination.  Patient to discharge at a wheelchair level [place].   Patient's care partner is independent to provide the necessary physical assistance at discharge.Pt's granddaughter has been present for multiple therapy sessions performing transfers (bed, car, mat, commode) with the pt safety and understands therapist's recommendations for home.   Reasons goals not met: Pt did not meet bed mobility goal due to her size/weight and hemiparesis of R side. Pt did not meet her w/c mobility goal as she requires min assist secondary to decreased ability to coordinate task with steering w/c.   Recommendation:  Patient will benefit from ongoing skilled PT services in home health setting to continue to advance safe functional mobility, address ongoing impairments in RLE muscle activation, postural control, LE strength, and balance, and minimize fall risk.  Equipment: hemiwalker, slideboard, hospital bed  Reasons for discharge: treatment goals met and discharge from hospital  Patient/family agrees with progress made and goals achieved: Yes  PT Treatment Interventions Discharge completed this session with focus on functional transfers including the car transfer and family education. Pt seated in w/c upon PT arrival, agreeable to therapy tx and denies pain. Pt's husband present, therapist providing education to husband regarding safety with transfers and techniques for transfers at home. Pt transported off unit for d/c this session. Therapist assisted pt  to perform car transfer from w/c>car with min assist and slideboard, emphasis on pushing through LEs to clear hips rather than sliding. Pt left in care of family with discharge.    PT Discharge Precautions/Restrictions Precautions Precautions: Fall Restrictions Weight Bearing Restrictions: No Cognition Overall Cognitive Status: Within Functional Limits for tasks assessed Arousal/Alertness: Awake/alert Orientation Level: Oriented X4 Sustained Attention: Appears intact Selective Attention: Appears intact Memory: Appears intact Awareness: Appears intact Problem Solving: Appears intact Safety/Judgment: Appears intact Sensation Sensation Light Touch: Impaired by gross assessment Light Touch Impaired Details: Impaired RLE Coordination Gross Motor Movements are Fluid and Coordinated: No Heel Shin Test: unable to perform with R LE secondary to hemiparesis Motor  Motor Motor: Hemiplegia Motor - Discharge Observations: R hemi, generalized weakness  Mobility Bed Mobility Bed Mobility: Supine to Sit;Sit to Supine;Rolling Right;Rolling Left Rolling Right: Supervision/verbal cueing Rolling Left: Minimal Assistance - Patient > 75% Supine to Sit: Minimal Assistance - Patient > 75% Sit to Supine: Minimal Assistance - Patient > 75% Transfers Transfers: Lateral/Scoot Transfers;Stand to Sit;Sit to Stand Sit to Stand: Minimal Assistance - Patient > 75% Stand to Sit: Minimal Assistance - Patient > 75% Lateral/Scoot Transfers: Minimal Assistance - Patient > 75% Transfer (Assistive device): Other (Comment)(slideboard) Ship broker: Yes Wheelchair Assistance: Minimal assistance - Patient >75% Wheelchair Propulsion: Left upper extremity;Left lower extremity Wheelchair Parts Management: Needs assistance Distance: 73'  Trunk/Postural Assessment  Cervical Assessment Cervical Assessment: Exceptions to WFL(forward head, rounded shoulder posture) Thoracic  Assessment Thoracic Assessment: Exceptions to [facility] Lumbar Assessment Lumbar Assessment: Exceptions to WFL(posterior pelvic tilt) Postural Control Postural Control: Within Functional Limits  Balance Balance Balance Assessed: Yes Static Sitting Balance Static Sitting - Balance Support: Feet supported;Right upper extremity supported Static Sitting - Level of  Assistance: 5: Stand by assistance Dynamic Sitting Balance Dynamic Sitting - Balance Support: Feet supported;No upper extremity supported Dynamic Sitting - Level of Assistance: 5: Stand by assistance Static Standing Balance Static Standing - Balance Support: Left upper extremity supported;During functional activity Static Standing - Level of Assistance: 4: Min assist Dynamic Standing Balance Dynamic Standing - Balance Support: Left upper extremity supported;During functional activity Dynamic Standing - Level of Assistance: 4: Min assist Extremity Assessment  RLE Assessment RLE Assessment: Exceptions to [facility] Passive Range of Motion (PROM) Comments: Knee flexion limited to 100 deg, hip extension limited to neutral  RLE Strength Right Hip Flexion: 2-/5 Right Hip Extension: 2-/5 Right Knee Flexion: 2-/5 Right Knee Extension: 2/5 Right Ankle Dorsiflexion: 0/5 Right Ankle Plantar Flexion: 0/5 LLE Assessment LLE Assessment: Within Functional Limits     Netta Corrigan, PT, DPT 12/08/17 07:48

## 2017-12-07 NOTE — Progress Notes (Signed)
Occupational Therapy Session Note  Patient Details  Name: Holly Flores MRN: [id number] Date of Birth: 10/19/1945  Today's Date: 12/07/2017 OT Individual Time: 3582-5189 OT Individual Time Calculation (min): 75 min    Short Term Goals: Week 3:  OT Short Term Goal 1 (Week 3): STG=LTG d/t ELOS  Skilled Therapeutic Interventions/Progress Updates:    OT intervention with focus on BADL retraining, functional transfers, sit<>stand, standing balance, activity tolerance, and safety awareness to increase independence with BADLs. Pt required min A for supine>sit EOB with HOB elevated.  Pt performed squat pivot transfer bed<>w/c with transfer board at mod A.  Pt requires min A for sit<>stand from w/c and min a for standing balance with R knee blocked.  Pt requires increased assistance with LB dressing tasks 2/2 body habitus. Pt declined shower 2/2 increased pain in jaw (pt states it is TMJ). Pt states she is pleased with progress and ready for discharge home tomorrow.  Pt's family participated in therapy yesterday. Pt remained seated in w/c with half lap tray in place and all needs within reach.   Therapy Documentation Precautions:  Precautions Precautions: Fall Precaution Comments: R hemi, R inattention Restrictions Weight Bearing Restrictions: No  Pain: Pain Assessment Pain Scale: 0-10 Pain Score: 8  Pain Type: Acute pain Pain Location: Face Pain Orientation: Left Pain Descriptors / Indicators: Aching(states "it feels like toothache") Pain Frequency: Occasional Pain Onset: Other (Comment)(pt states it started last night. ) Patients Stated Pain Goal: 0 Pain Intervention(s): Medication (See eMAR)(topamax given. Refused tylenol) ADL: ADL Eating: Set up Where Assessed-Eating: Wheelchair Grooming: Setup Where Assessed-Grooming: Wheelchair Upper Body Bathing: Minimal assistance Where Assessed-Upper Body Bathing: Sitting at sink Lower Body Bathing: Moderate assistance Where  Assessed-Lower Body Bathing: Sitting at sink Upper Body Dressing: Moderate assistance Where Assessed-Upper Body Dressing: Sitting at sink Lower Body Dressing: Maximal assistance Where Assessed-Lower Body Dressing: Sitting at sink Toileting: Maximal assistance Where Assessed-Toileting: Bedside Commode Toilet Transfer: Moderate assistance Toilet Transfer Method: Theatre manager: Drop arm bedside commode Tub/Shower Transfer: Moderate assistance Tub/Shower Transfer Method: Administrator, arts: Transfer tub bench  Therapy/Group: Individual Therapy  [name] 12/07/2017, 11:07 AM

## 2017-12-07 NOTE — Progress Notes (Signed)
Orthopedic Tech Progress Note Patient Details:  Holly Flores 27-Dec-1945 [id number]  Patient ID: LAMYIA CDEBACA, female   DOB: 05-02-1945, 72 y.o.   MRN: [id number]   Hildred Priest 12/07/2017, 10:25 AM Called in hanger brace order; spoke with [facility]

## 2017-12-07 NOTE — Progress Notes (Signed)
Occupational Therapy Discharge Summary  Patient Details  Name: Holly Flores MRN: [id number] Date of Birth: 1945/07/17   Patient has met 9 of 12 long term goals due to improved activity tolerance, improved balance, postural control, ability to compensate for deficits and improved awareness.  PT made steady progress with BADLs during this admission.  Pt continues to require max A for LB dressing and toileting tasks.  Pt's RUE is flaccid and requires tot A to use as a stabilizer.  Pt performs sit<>stand with min A and maintains standing balance with min A to facilitate assistance by caregiver.  Pt's family has been present and participated in therapy.  Patient to discharge at overall Mod to Max Assist level with her bathing, dressing and toileting skills.  Patient's care partner is independent to provide the necessary physical assistance at discharge.    Reasons goals not met: Pt was not able to meet all goals due to her flaccid RUE/RLE, edema in R extremities and body habitus.  Recommendation:  Patient will benefit from ongoing skilled OT services in home health setting to continue to advance functional skills in the area of BADL.  Equipment: Drop Arm BSC  Reasons for discharge: treatment goals met  Patient/family agrees with progress made and goals achieved: Yes  OT Discharge ADL ADL Eating: Set up Where Assessed-Eating: Wheelchair Grooming: Setup Where Assessed-Grooming: Wheelchair Upper Body Bathing: Minimal assistance Where Assessed-Upper Body Bathing: Sitting at sink Lower Body Bathing: Moderate assistance Where Assessed-Lower Body Bathing: Sitting at sink Upper Body Dressing: Moderate assistance Where Assessed-Upper Body Dressing: Sitting at sink Lower Body Dressing: Maximal assistance Where Assessed-Lower Body Dressing: Sitting at sink Toileting: Maximal assistance Where Assessed-Toileting: Bedside Commode Toilet Transfer: Moderate assistance Toilet Transfer Method:  Theatre manager: Drop arm bedside commode Tub/Shower Transfer: Moderate assistance Tub/Shower Transfer Method: Materials engineer Baseline Vision/History: No visual deficits Patient Visual Report: Blurring of vision Vision Assessment?: Yes Eye Alignment: Impaired (comment) Ocular Range of Motion: Within Functional Limits Alignment/Gaze Preference: Within Defined Limits Tracking/Visual Pursuits: Decreased smoothness of horizontal tracking;Decreased smoothness of vertical tracking Saccades: Additional head turns occurred during testing;Decreased speed of saccadic movement Convergence: Within functional limits Perception  Perception: Impaired Inattention/Neglect: Does not attend to right side of body Praxis Praxis: Intact Cognition Overall Cognitive Status: Within Functional Limits for tasks assessed Arousal/Alertness: Awake/alert Orientation Level: Oriented X4 Attention: Selective Sustained Attention: Appears intact Selective Attention: Appears intact Selective Attention Impairment: Functional complex;Verbal complex Memory: Appears intact Awareness: Appears intact Problem Solving: Appears intact Safety/Judgment: Appears intact Sensation Sensation Light Touch: Appears Intact Central sensation comments: RUE intact Light Touch Impaired Details: Impaired RLE Proprioception: Impaired Detail Proprioception Impaired Details: Absent RUE Coordination Gross Motor Movements are Fluid and Coordinated: No Fine Motor Movements are Fluid and Coordinated: No Finger Nose Finger Test: unable to complete with RUE Heel Shin Test: unable to perform with R LE secondary to hemiparesis Motor  Motor Motor: Hemiplegia Motor - Skilled Clinical Observations: R hemiparesis, generalized deconditioning Motor - Discharge Observations: R hemi, generalized weakness Mobility  Bed Mobility Bed Mobility: Supine to Sit;Sit to  Supine;Rolling Right;Rolling Left Rolling Right: Supervision/verbal cueing Rolling Left: Minimal Assistance - Patient > 75% Supine to Sit: Minimal Assistance - Patient > 75% Sit to Supine: Minimal Assistance - Patient > 75% Transfers Sit to Stand: Minimal Assistance - Patient > 75% Stand to Sit: Minimal Assistance - Patient > 75%  Trunk/Postural Assessment  Cervical Assessment Cervical Assessment: Exceptions to WFL(forward head, rounded  shoulders) Thoracic Assessment Thoracic Assessment: Exceptions to WFL(slight kyphosis) Lumbar Assessment Lumbar Assessment: Exceptions to WFL(posterior pelvic tilt) Postural Control Postural Control: Within Functional Limits Righting Reactions: delayed and inadequate  Balance Balance Balance Assessed: Yes Static Sitting Balance Static Sitting - Balance Support: Feet supported;Right upper extremity supported Static Sitting - Level of Assistance: 5: Stand by assistance Dynamic Sitting Balance Dynamic Sitting - Balance Support: Feet supported;No upper extremity supported Dynamic Sitting - Level of Assistance: 5: Stand by assistance Static Standing Balance Static Standing - Balance Support: Left upper extremity supported;During functional activity Static Standing - Level of Assistance: 4: Min assist Dynamic Standing Balance Dynamic Standing - Balance Support: Left upper extremity supported;During functional activity Dynamic Standing - Level of Assistance: 4: Min assist Extremity/Trunk Assessment RUE Assessment RUE Assessment: Exceptions to [facility] Passive Range of Motion (PROM) Comments: WFL Active Range of Motion (AROM) Comments: flaccid General Strength Comments: flaccid RUE Body System: Neuro Brunstrum levels for arm and hand: Hand;Arm Brunstrum level for arm: Stage I Presynergy Brunstrum level for hand: Stage I Flaccidity LUE Assessment LUE Assessment: Within Functional Limits   Holly Flores 12/07/2017, 2:20 PM

## 2017-12-07 NOTE — Progress Notes (Signed)
Physical Therapy Session Note  Patient Details  Name: Holly Flores MRN: [id number] Date of Birth: [birth year]  Today's Date: 12/07/2017 PT Individual Time: 1130-1155 PT Individual Time Calculation (min): 25 min   Short Term Goals: Week 3:  PT Short Term Goal 1 (Week 3): STG=LTG due to ELOS  Skilled Therapeutic Interventions/Progress Updates:   Pt in w/c and agreeable to therapy, reports pain in TMJ, RN aware. Performed lateral scoot transfers, bed mobility, and w/c mobility x50' this session. All functional mobility performed w/ min assist this session. Requires max verbal and tactile cues for technique w/ w/c mobility, otherwise min verbal and tactile cues for technique w/ bed mobility and lateral scoot transfer. Discussed hospital bed functions and how to adjust height of bed to help with slide board transfers. Pt verbalized understanding. Ended session in w/c, set up w/ lunch tray and all needs in reach.   Therapy Documentation Precautions:  Precautions Precautions: Fall Precaution Comments: R hemi, R inattention Restrictions Weight Bearing Restrictions: No  Therapy/Group: Individual Therapy  Darby Fleeman Clent Demark 12/07/2017, 12:07 PM

## 2017-12-07 NOTE — Progress Notes (Signed)
Occupational Therapy Session Note  Patient Details  Name: Holly Flores MRN: [id number] Date of Birth: May 05, 1945  Today's Date: 12/07/2017 OT Individual Time: 1300-1330 OT Individual Time Calculation (min): 30 min    Short Term Goals: Week 3:  OT Short Term Goal 1 (Week 3): STG=LTG d/t ELOS  Skilled Therapeutic Interventions/Progress Updates:    OT intervention with focus on sit<>stand and standing balance to facilitate care giver assistance with LB clothing management.  Pt performed sit<>stand X 4 with min A and maintained standing balance with min A and LUE support.  Kinesio Tape reapplied for edema management. Pt remained seated in w/c with all needs within reach.   Therapy Documentation Precautions:  Precautions Precautions: Fall Precaution Comments: R hemi, R inattention Restrictions Weight Bearing Restrictions: No Pain: Pain Assessment Pain Scale: 0-10 Pain Score: 8  Pain Type: Acute pain Pain Location: Face Pain Orientation: Left Pain Descriptors / Indicators: Aching Pain Frequency: Constant Pain Onset: Other (Comment)(last night) Patients Stated Pain Goal: 0 Pain Intervention(s): Cold applied Multiple Pain Sites: No   Therapy/Group: Individual Therapy  [name] 12/07/2017, 2:07 PM

## 2017-12-07 NOTE — Progress Notes (Signed)
Called ortho tech. Order placed for outside vender brace. Continue plan of care.  Holly Flores W Teancum Brule

## 2017-12-07 NOTE — Progress Notes (Signed)
Physical Therapy Session Note  Patient Details  Name: Holly Flores MRN: [id number] Date of Birth: 22-Mar-1945  Today's Date: 12/07/2017 PT Individual Time: 1500-1600 PT Individual Time Calculation (min): 60 min   Short Term Goals: Week 3:  PT Short Term Goal 1 (Week 3): STG=LTG due to ELOS  Skilled Therapeutic Interventions/Progress Updates:    Pt seated in w/c upon PT arrival, agreeable to therapy tx and denies pain. Pt transported to the gym. Pt's daughter present for continued family education. Pt performed x 1 squat pivot transfer and x 1 stand pivot transfer with HW and min assist from therapist. Pt's granddaughter performed squat pivot and stand pivot transfer, able to demonstrate techniques and set up without cues. Pt ambulated  x 20 ft, x 30 ft this session with HW and mod assist, R toe cap and R toe up brace, ambulation with therapist only. Therapist educated pt and family on HEP including PROM of R shoulder and AAROM of L LE. Pt transported back to room and left seated with needs in reach and family present.   Therapy Documentation Precautions:  Precautions Precautions: Fall Precaution Comments: R hemi, R inattention Restrictions Weight Bearing Restrictions: No    Therapy/Group: Individual Therapy  Netta Corrigan, PT, DPT 12/07/2017, 12:56 PM

## 2017-12-07 NOTE — Discharge Summary (Signed)
NAME: Holly Flores, Holly Flores MEDICAL RECORD [id number] ACCOUNT [id number] DATE OF BIRTH:05/08/1945 FACILITY: [facility] LOCATION: [facility] PHYSICIAN:ANDREW Letta Pate, MD  DISCHARGE SUMMARY  DATE OF DISCHARGE:  12/08/2017  DISCHARGE DIAGNOSES:  1.  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 2.  Chronic Eliquis for deep venous thrombosis prophylaxis. 3.  Pain management. 4.  Ogilvie syndrome 5.  Hypokalemia. 6.  Diastolic congestive heart failure.   7.  Hypertension.   8.  Paroxysmal atrial fibrillation.   9.  Hypothyroidism.   10.  Chronic kidney disease stage III 11.  Hyperlipidemia.  HISTORY OF PRESENT ILLNESS:  This is a 72 year old right-handed African-American female with history of diastolic congestive heart failure, PAF maintained on Eliquis.  CKD stage II-III disease.  Lives with spouse, independent prior to admission.   Presented 10/29/2017 to [facility] with right-sided weakness, double vision, difficulty with speech.  Troponin negative.  MRI showed left brain infarction.  Per CT and MRI report adjacent to the head of the caudate, CT angiogram of head and neck  with no large vessel occlusion.  There was a 3 x 4 mm basilar tip aneurysm extending posterolaterally with recommendations for annual MRSA CTA.  Echocardiogram with ejection fraction 65%, no wall motion abnormalities without thrombus.  The patient did  not receive tPA.  Follow up neurology services.  Chronic Eliquis initially to avoid hemorrhagic transformation.  Initially placed on aspirin and Eliquis later resumed 11/01/2017.  The patient was admitted to inpatient rehabilitation services 11/01/2017.   Ongoing therapies developed diffuse abdominal discomfort, worse on the left lower quadrant 11/08/2017.   KUB completed concerning for possible ileus.  Her diet was changed to full liquids.  Noted hypokalemia 2.7.  IV supplement added medicine team consulted.  Suspect Ogilvie syndrome in the setting of hypokalemia.  Clostridium difficile negative.  CT of abdomen 11/09/2017 showed fluid-filled distended  colon and transverse colon measuring 13 cm.  No distal obstruction.  The patient was discharged to acute care services 11/09/2017.  Gastroenterology service is consulted.  Advised conservative care.  Latest potassium level 3.5.  She continued on her  Eliquis as prior to her discharge to acute care services.  Her diet was slowly advanced.  She was later readmitted to inpatient rehabilitation services 11/15/2017 for ongoing therapies.  PAST MEDICAL HISTORY:  See discharge diagnoses.  SOCIAL HISTORY:  Lives with spouse, independent prior to admission.  FUNCTIONAL STATUS:  Upon admission to rehab services was moderate assist sit to stand, 2 person handheld assist for equipment use, moderate assist supine to sit, mod/max assist with activities of daily living.  PHYSICAL EXAMINATION: VITAL SIGNS:  Blood pressure 106/75, pulse 65, temperature 98, respirations 19. GENERAL:  Alert female, oriented to person and place. HEENT:  EOMs intact. NECK:  Supple, nontender, no JVD. CARDIOVASCULAR:  Rate controlled. ABDOMEN:  Mildly distended.  Slowed bowel sounds. LUNGS:  Clear to auscultation denture right-sided weakness.  REHABILITATION HOSPITAL COURSE:  The patient was admitted to inpatient rehabilitation services.  Therapies initiated on a 3-hour daily basis, consisting of physical therapy, occupational therapy, speech therapy and rehabilitation nursing.  The following  issues were addressed during patient's rehabilitation stay:    Pertaining to the patient's left external capsule infarction, remained stable.  She would remain on Eliquis therapy.  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   Recommendations of annual MRA CTA.  She had no other bleeding episodes.    Chronic pain with migraine headaches treated with Topamax and good results.    Ogilvie syndrome.  Follow up gastroenterology services monitoring of potassium levels.  No further bouts of nausea and vomiting.  Bowel program established.  She exhibited no other signs of fluid overload.  Her Demadex had been held due to hypokalemia.    Blood pressure is overall controlled.  Cardiac rate stable.  Again, she would remain on Eliquis.    CKD stage III, creatinine 1.20.  Followup of 1.08.    The patient received weekly collaborative interdisciplinary team conferences to discuss estimated length of stay, family teaching, any barriers to discharge.  The patient performed car transfers from wheelchair to the car with sliding board with  therapist.  Granddaughter x2.  Moderate assist emphasis on clearing her buttocks.  Therapy provided ongoing education.  Performed squat pivot transfers, wheelchair to mat x2 with granddaughter.  Mod assist.  Granddaughter remained on hand for full family  teaching.  Sessions focused on stand pivot squat pivot and sliding board transfers.  Patient ambulated 10 feet, hemi walker, moderate assist.  Activities of daily living and homemaking.  Working with bedside commode for toileting.  She could transfer  from the toilet to bench with a sliding board to the right side.  Again, it was discussed with family the ongoing need for assistance.  They were quite adamant about discharge to home.  Full family teaching was completed.  DISCHARGE MEDICATIONS:  Included Xanax 1 mg p.o. at bedtime, Eliquis 5 mg p.o. b.i.d., Lipitor 80 mg p.o. daily, Synthroid 50 mcg p.o. daily, Protonix 40 mg p.o. daily, potassium chloride 30 mEq p.o. b.i.d., Topamax 25 mg p.o. b.i.d.  DIET:  Regular consistency.    FOLLOWUP:  She should follow up with Dr. Alysia Penna at the outpatient rehab center as directed; Dr. Carlyle Dolly,  cardiology service,  call for appointment; Dr. Bufford Spikes, gastroenterology services, Dr. Merlene Laughter, neurology services, Dr.  Octavio Graves, medical management.  SPECIAL INSTRUCTIONS:  The patient should have followup potassium levels weekly.  Demadex remained on hold due to initial bouts of hypokalemia.  AN/NUANCE D:12/07/2017 T:12/07/2017 JOB:[id number]

## 2017-12-07 NOTE — Progress Notes (Signed)
Subjective/Complaints:  C/o Left jaw pain, no trauma, has hx TMJ and seen by DDS  ROS: Denies CP, SOB, N/V/D  Objective: Vital Signs: Blood pressure (!) 144/78, pulse 78, temperature 98.3 F (36.8 C), temperature source Oral, resp. rate (!) 21, height _0  (1.549 m), weight 100.2 kg, SpO2 97 %. No results found. Results for orders placed or performed during the hospital encounter of 11/15/17 (from the past 72 hour(s))  Basic metabolic panel     Status: Abnormal   Collection Time: 12/06/17  5:58 AM  Result Value Ref Range   Sodium 139 135 - 145 mmol/L   Potassium 3.6 3.5 - 5.1 mmol/L   Chloride 115 (H) 98 - 111 mmol/L   CO2 20 (L) 22 - 32 mmol/L   Glucose, Bld 97 70 - 99 mg/dL   BUN 11 8 - 23 mg/dL   Creatinine, Ser 1.08 (H) 0.44 - 1.00 mg/dL   Calcium 8.8 (L) 8.9 - 10.3 mg/dL   GFR calc non Af Amer 50 (L) >60 mL/min   GFR calc Af Amer 58 (L) >60 mL/min    Comment: (NOTE) The eGFR has been calculated using the CKD EPI equation. This calculation has not been validated in all clinical situations. eGFR's persistently <60 mL/min signify possible Chronic Kidney Disease.    Anion gap 4 (L) 5 - 15    Comment: Performed at [facility] [address] [place], [place] [address]  CBC with Differential/Platelet     Status: Abnormal   Collection Time: 12/06/17  9:39 AM  Result Value Ref Range   WBC 8.8 4.0 - 10.5 K/uL   RBC 4.22 3.87 - 5.11 MIL/uL   Hemoglobin 12.2 12.0 - 15.0 g/dL   HCT 39.2 36.0 - 46.0 %   MCV 92.9 80.0 - 100.0 fL   MCH 28.9 26.0 - 34.0 pg   MCHC 31.1 30.0 - 36.0 g/dL   RDW 13.8 11.5 - 15.5 %   Platelets 289 150 - 400 K/uL   nRBC 0.0 0.0 - 0.2 %   Neutrophils Relative % 40 %   Neutro Abs 3.5 1.7 - 7.7 K/uL   Lymphocytes Relative 48 %   Lymphs Abs 4.2 (H) 0.7 - 4.0 K/uL   Monocytes Relative 7 %   Monocytes Absolute 0.6 0.1 - 1.0 K/uL   Eosinophils Relative 4 %   Eosinophils Absolute 0.3 0.0 - 0.5 K/uL   Basophils Relative 1 %   Basophils Absolute  0.0 0.0 - 0.1 K/uL   Immature Granulocytes 0 %   Abs Immature Granulocytes 0.02 0.00 - 0.07 K/uL    Comment: Performed at [facility] [address], [place], [place] [address]     Constitutional: No distress . Vital signs reviewed. HENT: Normocephalic.  Atraumatic. Eyes: EOMI. No discharge. Cardiovascular: RRR.  No JVD. Respiratory: CTA bilaterally.  Normal effort. GI: BS +.  Nondistended Musc: RUE/RLE edema  Neuro: Alert/Oriented Motor: 0/5 in RUE/RLE, unchanged Increased tone in right finger flexors and ankle plantar flexors, unchanged Dysarthria Skin:   Intact.  Warm and dry.  Assessment/Plan: 1. Functional deficits secondary to Right hemiparesis, left subcortical infarct which require 3+ hours per day of interdisciplinary therapy in a comprehensive inpatient rehab setting. Physiatrist is providing close team supervision and 24 hour management of active medical problems listed below. Physiatrist and rehab team continue to assess barriers to discharge/monitor patient progress toward functional and medical goals. FIM:  Medical Problem List and Plan: 1.  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  Cont CIR   WHO/PRAFO  2.  DVT Prophylaxis/Anticoagulation: Chronic Eliquis has been resumed. 3. Pain Management: Tylenol as needed  Chronic migraines, treated with topamax 164m BID as outpt per PCP, restarted at 247mBID  Topamax 25 mg twice daily   Controlled on 10/22 Now with TMJ symptoms add flexeril at noc May use heat  Alt with ice during the day 4. Mood: Xanax 1 mg nightly 5. Neuropsych: This patient is?  Fully capable of making decisions on her own behalf. 6. Skin/Wound Care: Routine skin  checks 7. 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  Advance diet as tolerated 9.  Hypokalemia.      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 11.  Diastolic congestive heart failure.  Demadex 20 mg held due to hypokalemia.  Monitor for any signs of fluid overload Filed Weights   11/23/17 0421 11/24/17 0512 12/01/17 0343  Weight: 100 kg 100.1 kg 100.2 kg   Stable on 10/22 12.  Hypertension.  Monitor with increased mobility Vitals:   12/06/17 2203 12/07/17 0540  BP: 139/84 (!) 144/78  Pulse: 66 78  Resp: 18 (!) 21  Temp: 98.1 F (36.7 C) 98.3 F (36.8 C)  SpO2: 100% 97%   Relatively controlled on 10/22 13.  PAF.  Continue Eliquis.  Cardiac rate controlled 10/22 14.  Hypothyroidism.  Continue Synthroid 15.  CKD stage III.    Creatinine 1.20 on 10/18, repeat 10/21 pnd  Stable 16.  Hyperlipidemia.  Lipitor 17.  Hypoalbuminemia  Supplement initiated 18. ABLA- resolved  Hb 11.4 on 10/15, now 12.2  Repeat pnd 10/21  Cont to monitor  LOS (Days) 22 A FACE TO FACE EVALUATION WAS PERFORMED  AnCharlett Blake0/22/2019, 7:32 AM

## 2017-12-07 NOTE — Discharge Summary (Signed)
Discharge summary job [id number]

## 2017-12-08 ENCOUNTER — Inpatient Hospital Stay (HOSPITAL_COMMUNITY): Payer: Self-pay

## 2017-12-08 DIAGNOSIS — I6300 Cerebral infarction due to thrombosis of unspecified precerebral artery: Secondary | ICD-10-CM

## 2017-12-08 MED ORDER — PANTOPRAZOLE SODIUM 40 MG PO TBEC: 40.0000 mg | DELAYED_RELEASE_TABLET | Freq: Every day | ORAL | 0 refills | Status: DC

## 2017-12-08 MED ORDER — ALPRAZOLAM 0.5 MG PO TABS: 0.5000 mg | ORAL_TABLET | Freq: Every day | ORAL | 0 refills | Status: DC

## 2017-12-08 MED ORDER — POTASSIUM CHLORIDE CRYS ER 20 MEQ PO TBCR: 20.0000 meq | EXTENDED_RELEASE_TABLET | Freq: Every day | ORAL | 1 refills | Status: AC

## 2017-12-08 MED ORDER — TOPIRAMATE 25 MG PO TABS: 25.0000 mg | ORAL_TABLET | Freq: Two times a day (BID) | ORAL | 0 refills | Status: DC

## 2017-12-08 MED ORDER — APIXABAN 5 MG PO TABS: 5.0000 mg | ORAL_TABLET | Freq: Two times a day (BID) | ORAL | 3 refills | Status: DC

## 2017-12-08 MED ORDER — LEVOTHYROXINE SODIUM 50 MCG PO TABS: 50.0000 ug | ORAL_TABLET | Freq: Every day | ORAL | 0 refills | Status: DC

## 2017-12-08 MED ORDER — ACETAMINOPHEN 325 MG PO TABS: 650.0000 mg | ORAL_TABLET | Freq: Four times a day (QID) | ORAL | Status: DC | PRN

## 2017-12-08 MED ORDER — CYCLOBENZAPRINE HCL 5 MG PO TABS: 5.0000 mg | ORAL_TABLET | Freq: Every day | ORAL | 0 refills | Status: AC

## 2017-12-08 MED ORDER — ALBUTEROL SULFATE HFA 108 (90 BASE) MCG/ACT IN AERS: 2.0000 | INHALATION_SPRAY | Freq: Four times a day (QID) | RESPIRATORY_TRACT | 1 refills | Status: AC | PRN

## 2017-12-08 MED ORDER — ATORVASTATIN CALCIUM 80 MG PO TABS: 80.0000 mg | ORAL_TABLET | Freq: Every day | ORAL | 0 refills | Status: DC

## 2017-12-08 NOTE — Discharge Instructions (Signed)
Inpatient Rehab Discharge Instructions  Holly Flores Discharge date and time: No discharge date for patient encounter.   Activities/Precautions/ Functional Status: Activity: activity as tolerated Diet: Soft Wound Care: none needed Functional status:  ___ No restrictions     ___ Walk up steps independently ___ 24/7 supervision/assistance   ___ Walk up steps with assistance ___ Intermittent supervision/assistance  ___ Bathe/dress independently ___ Walk with walker     _x STROKE/TIA DISCHARGE INSTRUCTIONS SMOKING Cigarette smoking nearly doubles your risk of having a stroke & is the single most alterable risk factor  If you smoke or have smoked in the last 12 months, you are advised to quit smoking for your health.  Most of the excess cardiovascular risk related to smoking disappears within a year of stopping.  Ask you doctor about anti-smoking medications  [place] Quit Line: 1-800-QUIT NOW  Free Smoking Cessation Classes [phone]  CHOLESTEROL Know your levels; limit fat & cholesterol in your diet  Lipid Panel     Component Value Date/Time   CHOL 150 10/30/2017 0612   TRIG 128 10/30/2017 0612   HDL 33 (L) 10/30/2017 0612   CHOLHDL 4.5 10/30/2017 0612   VLDL 26 10/30/2017 0612   LDLCALC 91 10/30/2017 0612      Many patients benefit from treatment even if their cholesterol is at goal.  Goal: Total Cholesterol (CHOL) less than 160  Goal:  Triglycerides (TRIG) less than 150  Goal:  HDL greater than 40  Goal:  LDL (LDLCALC) less than 100   BLOOD PRESSURE American Stroke Association blood pressure target is less that 120/80 mm/Hg  Your discharge blood pressure is:  BP: 116/67  Monitor your blood pressure  Limit your salt and alcohol intake  Many individuals will require more than one medication for high blood pressure  DIABETES (A1c is a blood sugar average for last 3 months) Goal HGBA1c is under 7% (HBGA1c is blood sugar average for last 3 months)  Diabetes: No  known diagnosis of diabetes    Lab Results  Component Value Date   HGBA1C 6.1 (H) 10/30/2017     Your HGBA1c can be lowered with medications, healthy diet, and exercise.  Check your blood sugar as directed by your physician  Call your physician if you experience unexplained or low blood sugars.  PHYSICAL ACTIVITY/REHABILITATION Goal is 30 minutes at least 4 days per week  Activity: Increase activity slowly, Therapies: Physical Therapy: Home Health Return to work:   Activity decreases your risk of heart attack and stroke and makes your heart stronger.  It helps control your weight and blood pressure; helps you relax and can improve your mood.  Participate in a regular exercise program.  Talk with your doctor about the best form of exercise for you (dancing, walking, swimming, cycling).  DIET/WEIGHT Goal is to maintain a healthy weight  Your discharge diet is:  Diet Order            DIET SOFT Room service appropriate? Yes; Fluid consistency: Thin  Diet effective now              liquids Your height is:  Height: 5\' 1"  (154.9 cm) Your current weight is: Weight: 96.6 kg Your Body Mass Index (BMI) is:  BMI (Calculated): 40.26  Following the type of diet specifically designed for you will help prevent another stroke.  Your goal weight range is:    Your goal Body Mass Index (BMI) is 19-24.  Healthy food habits can help reduce 3  risk factors for stroke:  High cholesterol, hypertension, and excess weight.  RESOURCES Stroke/Support Group:  Call [phone]   STROKE EDUCATION PROVIDED/REVIEWED AND GIVEN TO PATIENT Stroke warning signs and symptoms How to activate emergency medical system (call 911). Medications prescribed at discharge. Need for follow-up after discharge. Personal risk factors for stroke. Pneumonia vaccine given:  Flu vaccine given:  My questions have been answered, the writing is legible, and I understand these instructions.  I will adhere to these goals &  educational materials that have been provided to me after my discharge from the hospital.   __ Bathe/dress with assistance ___ Walk Independently    ___ Shower independently ___ Walk with assistance    ___ Shower with assistance ___ No alcohol     ___ Return to work/school ________  COMMUNITY REFERRALS UPON DISCHARGE:   Home Health:   PT     OT     RN  Agency:  [facility] Phone:  [phone] Medical Equipment/Items Ordered:  Hospital bed; drop arm commode; 30" transfer board; hemi-walker  Agency/Supplier:  [facility]         Phone:  [phone] Other:  You already had a wheelchair from [facility].  GENERAL COMMUNITY RESOURCES FOR PATIENT/FAMILY: Support Groups:  [facility] Stroke Support Group                              Meets the second Thursday of each month from 6-7 PM (except June, July, and August)                              In the dayroom of [address] at [facility], [address]                              For more information, please call [phone]                               [facility] Support Group (for survivors, family members and caregivers)                              Meets the 3rd Monday of each month from 1:30-2:30 PM                              [facility], [place] [address], [place]                              For more information, call Lewanda Rife at [phone] or Mauricio Po at [phone]  Special Instructions:    My questions have been answered and I understand these instructions. I will adhere to these goals and the provided educational materials after my discharge from the hospital.  Patient/Caregiver Signature _______________________________ Date __________  Clinician Signature _______________________________________ Date __________  Please bring this form and your medication list with you to all your follow-up doctor's  appointments.

## 2017-12-08 NOTE — Plan of Care (Signed)
  Problem: Consults Goal: RH STROKE PATIENT EDUCATION Description See Patient Education module for education specifics  Outcome: Completed/Met   Problem: RH BOWEL ELIMINATION Goal: RH STG MANAGE BOWEL WITH ASSISTANCE Description STG Manage Bowel with Mod Assistance.   Outcome: Completed/Met Goal: RH STG MANAGE BOWEL W/MEDICATION W/ASSISTANCE Description STG Manage Bowel with Medication with mod Assistance.   Outcome: Completed/Met   Problem: RH BLADDER ELIMINATION Goal: RH STG MANAGE BLADDER WITH ASSISTANCE Description STG Manage Bladder With Mod Assistance   Outcome: Completed/Met

## 2017-12-08 NOTE — Progress Notes (Signed)
Social Work Discharge Note  The overall goal for the admission was met for:   Discharge location: Yes - home with family  Length of Stay: Yes - 23 days  Discharge activity level: Yes - minimal assistance, wheelchair level  Home/community participation: Yes  Services provided included: MD, RD, PT, OT, SLP, RN, Pharmacy, [place]: Medicare and Other: [employer]  Follow-up services arranged: Home Health: PT/OT/RN, DME: hospital bed; 30" transfer board; drop arm commode; hemi walker; pt had w/c already at home and Patient/Family request agency HH: [facility] - had them in 2015, DME: [facility] (or additional information): Caryl Pina has come for family education and is prepared to take pt home.  Other family members will assist with pt's care, as well, as pt's husband is in treatment for leukemia.    Patient/Family verbalized understanding of follow-up arrangements: Yes  Individual responsible for coordination of the follow-up plan: Pt and her family  Confirmed correct DME delivered: Trey Sailors 12/08/2017    Lenyx Boody, Silvestre Mesi

## 2017-12-08 NOTE — Progress Notes (Signed)
Pt discharged home with daughter and husband in w/c. Pt d/c instructions given by Linna Hoff PA, OT went with pt and family to address transferring pt to car. Pt denies questions and is excited to go home,  Pt is in stable condition with no c/o.

## 2017-12-08 NOTE — Progress Notes (Signed)
Subjective/Complaints:  No issues overnite except her TMJ pain, discussed usual med (NSAID) and risk of stroke, also discussed strong narcotics elevate fall risk (not to mention her hx of Ileus) Rec alt heat and ice as well as tylenol for pain  ROS: Denies CP, SOB, N/V/D  Objective: Vital Signs: Blood pressure (!) 154/73, pulse 83, temperature 98.3 F (36.8 C), temperature source Oral, resp. rate 18, height 5' 1" (1.549 m), weight 100.2 kg, SpO2 97 %. No results found. Results for orders placed or performed during the hospital encounter of 11/15/17 (from the past 72 hour(s))  Basic metabolic panel     Status: Abnormal   Collection Time: 12/06/17  5:58 AM  Result Value Ref Range   Sodium 139 135 - 145 mmol/L   Potassium 3.6 3.5 - 5.1 mmol/L   Chloride 115 (H) 98 - 111 mmol/L   CO2 20 (L) 22 - 32 mmol/L   Glucose, Bld 97 70 - 99 mg/dL   BUN 11 8 - 23 mg/dL   Creatinine, Ser 1.08 (H) 0.44 - 1.00 mg/dL   Calcium 8.8 (L) 8.9 - 10.3 mg/dL   GFR calc non Af Amer 50 (L) >60 mL/min   GFR calc Af Amer 58 (L) >60 mL/min    Comment: (NOTE) The eGFR has been calculated using the CKD EPI equation. This calculation has not been validated in all clinical situations. eGFR's persistently <60 mL/min signify possible Chronic Kidney Disease.    Anion gap 4 (L) 5 - 15    Comment: Performed at [facility], [address] [place], [place] [address]  CBC with Differential/Platelet     Status: Abnormal   Collection Time: 12/06/17  9:39 AM  Result Value Ref Range   WBC 8.8 4.0 - 10.5 K/uL   RBC 4.22 3.87 - 5.11 MIL/uL   Hemoglobin 12.2 12.0 - 15.0 g/dL   HCT 39.2 36.0 - 46.0 %   MCV 92.9 80.0 - 100.0 fL   MCH 28.9 26.0 - 34.0 pg   MCHC 31.1 30.0 - 36.0 g/dL   RDW 13.8 11.5 - 15.5 %   Platelets 289 150 - 400 K/uL   nRBC 0.0 0.0 - 0.2 %   Neutrophils Relative % 40 %   Neutro Abs 3.5 1.7 - 7.7 K/uL   Lymphocytes Relative 48 %   Lymphs Abs 4.2 (H) 0.7 - 4.0 K/uL   Monocytes Relative 7 %    Monocytes Absolute 0.6 0.1 - 1.0 K/uL   Eosinophils Relative 4 %   Eosinophils Absolute 0.3 0.0 - 0.5 K/uL   Basophils Relative 1 %   Basophils Absolute 0.0 0.0 - 0.1 K/uL   Immature Granulocytes 0 %   Abs Immature Granulocytes 0.02 0.00 - 0.07 K/uL    Comment: Performed at [facility], [address] [place], [place] [address]     Constitutional: No distress . Vital signs reviewed. HENT: Normocephalic.  Atraumatic. Eyes: EOMI. No discharge. Cardiovascular: RRR.  No JVD. Respiratory: CTA bilaterally.  Normal effort. GI: BS +.  Nondistended Musc: RUE/RLE edema  Neuro: Alert/Oriented Motor: 0/5 in RUE/RLE, unchanged Increased tone in right finger flexors and ankle plantar flexors, unchanged Dysarthria Skin:   Intact.  Warm and dry.  Assessment/Plan: 1. Functional deficits secondary to Right hemiparesis, left subcortical infarct  Stable for D/C today F/u PCP in 3-4 weeks F/u PM&R 2 weeks See D/C summary See D/C instructions FIM:                                    Medical Problem List and Plan: 1.  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  D/C home today 2.  DVT Prophylaxis/Anticoagulation: Chronic Eliquis has been resumed. 3. Pain Management: Tylenol as needed  Chronic migraines, treated with topamax 100mg BID as outpt per PCP, restarted at 25mg BID  Topamax 25 mg twice daily   Controlled on 10/22 Now with TMJ symptoms add flexeril at noc May use heat  Alt with ice during the day 4. Mood: Xanax 1 mg nightly 5. Neuropsych: This patient is?  Fully capable of making decisions on her own behalf. 6. Skin/Wound Care: Routine skin checks 7. Fluids/Electrolytes/Nutrition: Routine in and outs 8.  Ogilvie syndrome.  Follow-up per  gastroenterology services.  Advance diet as tolerated 9.  Hypokalemia.      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 11.  Diastolic congestive heart failure.  Demadex 20 mg held due to hypokalemia.  Monitor for any signs of fluid overload  12.  Hypertension.  Monitor with increased mobility Vitals:   12/07/17 1953 12/08/17 0549  BP: (!) 142/78 (!) 154/73  Pulse: 63 83  Resp: 18 18  Temp: 98.6 F (37 C) 98.3 F (36.8 C)  SpO2: 100% 97%   Mild elevation of systolic this am but generally controlled f/u PCP 13.  PAF.  Continue Eliquis.  Cardiac rate controlled 10/22 14.  Hypothyroidism.  Continue Synthroid 15.  CKD stage III.    Creatinine 1.20 on 10/18, repeat 10/21 pnd  Stable 16.  Hyperlipidemia.  Lipitor 17.  Hypoalbuminemia  Supplement initiated 18. ABLA- resolved  Hb 11.4 on 10/15, now 12.2    LOS (Days) 23 A FACE TO FACE EVALUATION WAS PERFORMED  Andrew E Kirsteins 12/08/2017, 8:18 AM   

## 2017-12-09 ENCOUNTER — Telehealth: Payer: Self-pay | Admitting: Registered Nurse

## 2017-12-09 NOTE — Telephone Encounter (Signed)
Transitional Care call Transitional Care Call Completed   Patient name: Holly Flores  DOB: 09-15-1945 1. Are you/is patient experiencing any problems since coming home? No a. Are there any questions regarding any aspect of care? No 2. Are there any questions regarding medications administration/dosing? No a. Are meds being taken as prescribed? Yes b. "Patient should review meds with caller to confirm"  Medication List Reviewed 3. Have there been any falls? No 4. Has Home Health been to the house and/or have they contacted you? Yes, [facility]. a. If not, have you tried to contact them? NA b. Can we help you contact them? NA 5. Are bowels and bladder emptying properly? Yes a. Are there any unexpected incontinence issues? No b. If applicable, is patient following bowel/bladder programs? NA 6. Any fevers, problems with breathing, unexpected pain? No 7. Are there any skin problems or new areas of breakdown? No 8. Has the patient/family member arranged specialty MD follow up (ie cardiology/neurology/renal/surgical/etc.)?  Re-iterated  To [name] to call for schedule followed up appointments. Reviewed the discharge instructions To- do- list, they verbalize understanding.  a. Can we help arrange? No 9. Does the patient need any other services or support that we can help arrange? No 10. Are caregivers following through as expected in assisting the patient? Yes.  11. Has the patient quit smoking, drinking alcohol, or using drugs as recommended? [name] reports she doesn't smoke, drink alcohol or use illicit drugs.   Appointment date/time 12/20/2017  arrival time 11:00 for 11:20 appointment, with Danella Sensing ANP-C. At [facility]

## 2017-12-10 ENCOUNTER — Telehealth: Payer: Self-pay | Admitting: *Deleted

## 2017-12-10 NOTE — Telephone Encounter (Signed)
Cheryl RN [facility] called for SN POC 1wk1,3wk3 and to add a [facility] CNA  Approval given.

## 2017-12-19 ENCOUNTER — Encounter (HOSPITAL_COMMUNITY): Payer: Self-pay | Admitting: Emergency Medicine

## 2017-12-19 ENCOUNTER — Inpatient Hospital Stay (HOSPITAL_COMMUNITY)
Admission: EM | Admit: 2017-12-19 | Discharge: 2017-12-22 | DRG: 057 | Disposition: A | Discharge status: Skilled Nursing Facility | Payer: Medicare Other | Attending: Student in an Organized Health Care Education/Training Program | Admitting: Student in an Organized Health Care Education/Training Program

## 2017-12-19 ENCOUNTER — Emergency Department (HOSPITAL_COMMUNITY): Payer: Medicare Other

## 2017-12-19 DIAGNOSIS — I69351 Hemiplegia and hemiparesis following cerebral infarction affecting right dominant side: Secondary | ICD-10-CM | POA: Diagnosis not present

## 2017-12-19 DIAGNOSIS — E785 Hyperlipidemia, unspecified: Secondary | ICD-10-CM | POA: Diagnosis present

## 2017-12-19 DIAGNOSIS — M1990 Unspecified osteoarthritis, unspecified site: Secondary | ICD-10-CM | POA: Diagnosis present

## 2017-12-19 DIAGNOSIS — F329 Major depressive disorder, single episode, unspecified: Secondary | ICD-10-CM | POA: Diagnosis present

## 2017-12-19 DIAGNOSIS — Z79899 Other long term (current) drug therapy: Secondary | ICD-10-CM

## 2017-12-19 DIAGNOSIS — I252 Old myocardial infarction: Secondary | ICD-10-CM | POA: Diagnosis not present

## 2017-12-19 DIAGNOSIS — R5381 Other malaise: Secondary | ICD-10-CM | POA: Diagnosis not present

## 2017-12-19 DIAGNOSIS — I5032 Chronic diastolic (congestive) heart failure: Secondary | ICD-10-CM | POA: Diagnosis present

## 2017-12-19 DIAGNOSIS — Z86010 Personal history of colonic polyps: Secondary | ICD-10-CM | POA: Diagnosis not present

## 2017-12-19 DIAGNOSIS — M7989 Other specified soft tissue disorders: Secondary | ICD-10-CM | POA: Diagnosis present

## 2017-12-19 DIAGNOSIS — Z9841 Cataract extraction status, right eye: Secondary | ICD-10-CM

## 2017-12-19 DIAGNOSIS — R079 Chest pain, unspecified: Secondary | ICD-10-CM | POA: Diagnosis not present

## 2017-12-19 DIAGNOSIS — I480 Paroxysmal atrial fibrillation: Secondary | ICD-10-CM | POA: Diagnosis present

## 2017-12-19 DIAGNOSIS — Z961 Presence of intraocular lens: Secondary | ICD-10-CM | POA: Diagnosis present

## 2017-12-19 DIAGNOSIS — R140 Abdominal distension (gaseous): Secondary | ICD-10-CM

## 2017-12-19 DIAGNOSIS — K567 Ileus, unspecified: Secondary | ICD-10-CM | POA: Diagnosis present

## 2017-12-19 DIAGNOSIS — Z79890 Hormone replacement therapy (postmenopausal): Secondary | ICD-10-CM

## 2017-12-19 DIAGNOSIS — J449 Chronic obstructive pulmonary disease, unspecified: Secondary | ICD-10-CM | POA: Diagnosis present

## 2017-12-19 DIAGNOSIS — Z8249 Family history of ischemic heart disease and other diseases of the circulatory system: Secondary | ICD-10-CM

## 2017-12-19 DIAGNOSIS — N182 Chronic kidney disease, stage 2 (mild): Secondary | ICD-10-CM | POA: Diagnosis present

## 2017-12-19 DIAGNOSIS — Z96651 Presence of right artificial knee joint: Secondary | ICD-10-CM | POA: Diagnosis present

## 2017-12-19 DIAGNOSIS — G4733 Obstructive sleep apnea (adult) (pediatric): Secondary | ICD-10-CM | POA: Diagnosis present

## 2017-12-19 DIAGNOSIS — Z8673 Personal history of transient ischemic attack (TIA), and cerebral infarction without residual deficits: Secondary | ICD-10-CM | POA: Diagnosis not present

## 2017-12-19 DIAGNOSIS — E039 Hypothyroidism, unspecified: Secondary | ICD-10-CM | POA: Diagnosis present

## 2017-12-19 DIAGNOSIS — I639 Cerebral infarction, unspecified: Secondary | ICD-10-CM | POA: Diagnosis not present

## 2017-12-19 DIAGNOSIS — R609 Edema, unspecified: Secondary | ICD-10-CM | POA: Diagnosis not present

## 2017-12-19 DIAGNOSIS — G43109 Migraine with aura, not intractable, without status migrainosus: Secondary | ICD-10-CM | POA: Diagnosis not present

## 2017-12-19 DIAGNOSIS — J189 Pneumonia, unspecified organism: Secondary | ICD-10-CM

## 2017-12-19 DIAGNOSIS — Z9842 Cataract extraction status, left eye: Secondary | ICD-10-CM

## 2017-12-19 DIAGNOSIS — Z7902 Long term (current) use of antithrombotics/antiplatelets: Secondary | ICD-10-CM

## 2017-12-19 DIAGNOSIS — G459 Transient cerebral ischemic attack, unspecified: Secondary | ICD-10-CM | POA: Diagnosis present

## 2017-12-19 DIAGNOSIS — I69354 Hemiplegia and hemiparesis following cerebral infarction affecting left non-dominant side: Secondary | ICD-10-CM | POA: Diagnosis not present

## 2017-12-19 DIAGNOSIS — K5901 Slow transit constipation: Secondary | ICD-10-CM | POA: Diagnosis present

## 2017-12-19 DIAGNOSIS — Z87891 Personal history of nicotine dependence: Secondary | ICD-10-CM

## 2017-12-19 DIAGNOSIS — K598 Other specified functional intestinal disorders: Secondary | ICD-10-CM | POA: Diagnosis not present

## 2017-12-19 DIAGNOSIS — E876 Hypokalemia: Secondary | ICD-10-CM | POA: Diagnosis not present

## 2017-12-19 DIAGNOSIS — Z880 Allergy status to penicillin: Secondary | ICD-10-CM

## 2017-12-19 DIAGNOSIS — K219 Gastro-esophageal reflux disease without esophagitis: Secondary | ICD-10-CM | POA: Diagnosis present

## 2017-12-19 DIAGNOSIS — R531 Weakness: Secondary | ICD-10-CM | POA: Diagnosis not present

## 2017-12-19 DIAGNOSIS — F419 Anxiety disorder, unspecified: Secondary | ICD-10-CM | POA: Diagnosis present

## 2017-12-19 DIAGNOSIS — I69392 Facial weakness following cerebral infarction: Secondary | ICD-10-CM | POA: Diagnosis not present

## 2017-12-19 DIAGNOSIS — R109 Unspecified abdominal pain: Secondary | ICD-10-CM | POA: Diagnosis not present

## 2017-12-19 DIAGNOSIS — I130 Hypertensive heart and chronic kidney disease with heart failure and stage 1 through stage 4 chronic kidney disease, or unspecified chronic kidney disease: Secondary | ICD-10-CM | POA: Diagnosis present

## 2017-12-19 HISTORY — DX: Low back pain: M54.5

## 2017-12-19 HISTORY — DX: Cerebral infarction due to unspecified occlusion or stenosis of left middle cerebral artery: I63.512

## 2017-12-19 HISTORY — DX: Pneumonia, unspecified organism: J18.9

## 2017-12-19 HISTORY — DX: Dependence on supplemental oxygen: Z99.81

## 2017-12-19 HISTORY — DX: Central retinal vein occlusion, left eye, stable: H34.8122

## 2017-12-19 HISTORY — DX: Other chronic pain: G89.29

## 2017-12-19 HISTORY — DX: Fibromyalgia: M79.7

## 2017-12-19 HISTORY — DX: Low back pain, unspecified: M54.50

## 2017-12-19 HISTORY — DX: Personal history of colonic polyps: Z86.010

## 2017-12-19 HISTORY — DX: Unspecified chronic bronchitis: J42

## 2017-12-19 HISTORY — DX: Cerebral infarction, unspecified: I63.9

## 2017-12-19 HISTORY — DX: Chronic kidney disease, stage 2 (mild): N18.2

## 2017-12-19 HISTORY — DX: Migraine, unspecified, not intractable, without status migrainosus: G43.909

## 2017-12-19 LAB — BASIC METABOLIC PANEL
Anion gap: 8 (ref 5–15)
BUN: 11 mg/dL (ref 8–23)
CHLORIDE: 108 mmol/L (ref 98–111)
CO2: 22 mmol/L (ref 22–32)
Calcium: 9.2 mg/dL (ref 8.9–10.3)
Creatinine, Ser: 1.16 mg/dL — ABNORMAL HIGH (ref 0.44–1.00)
GFR calc non Af Amer: 46 mL/min — ABNORMAL LOW (ref >60)
GFR, EST AFRICAN AMERICAN: 53 mL/min — AB (ref >60)
Glucose, Bld: 116 mg/dL — ABNORMAL HIGH (ref 70–99)
POTASSIUM: 3.4 mmol/L — AB (ref 3.5–5.1)
Sodium: 138 mmol/L (ref 135–145)

## 2017-12-19 LAB — CBC
HEMATOCRIT: 45.3 % (ref 36.0–46.0)
HEMOGLOBIN: 14.1 g/dL (ref 12.0–15.0)
MCH: 29.2 pg (ref 26.0–34.0)
MCHC: 31.1 g/dL (ref 30.0–36.0)
MCV: 93.8 fL (ref 80.0–100.0)
NRBC: 0 % (ref 0.0–0.2)
Platelets: 296 10*3/uL (ref 150–400)
RBC: 4.83 MIL/uL (ref 3.87–5.11)
RDW: 13.1 % (ref 11.5–15.5)
WBC: 7.5 10*3/uL (ref 4.0–10.5)

## 2017-12-19 LAB — PROTIME-INR
INR: 1.39
PROTHROMBIN TIME: 16.9 s — AB (ref 11.4–15.2)

## 2017-12-19 LAB — BRAIN NATRIURETIC PEPTIDE: B NATRIURETIC PEPTIDE 5: 48.3 pg/mL (ref 0.0–100.0)

## 2017-12-19 LAB — I-STAT TROPONIN, ED: Troponin i, poc: 0 ng/mL (ref 0.00–0.08)

## 2017-12-19 LAB — APTT: aPTT: 39 seconds — ABNORMAL HIGH (ref 24–36)

## 2017-12-19 MED ORDER — PANTOPRAZOLE SODIUM 40 MG PO TBEC
40.0000 mg | DELAYED_RELEASE_TABLET | Freq: Every day | ORAL | Status: DC
  Administered 2017-12-21 – 2017-12-22 (×2): 40 mg via ORAL
  Filled 2017-12-19 (×4): qty 1

## 2017-12-19 MED ORDER — METOPROLOL SUCCINATE ER 50 MG PO TB24
50.0000 mg | ORAL_TABLET | Freq: Every day | ORAL | Status: DC
  Administered 2017-12-21 – 2017-12-22 (×2): 50 mg via ORAL
  Filled 2017-12-19 (×5): qty 1

## 2017-12-19 MED ORDER — POTASSIUM CHLORIDE CRYS ER 20 MEQ PO TBCR
40.0000 meq | EXTENDED_RELEASE_TABLET | Freq: Two times a day (BID) | ORAL | Status: AC
  Filled 2017-12-19: qty 2

## 2017-12-19 MED ORDER — ALPRAZOLAM 0.5 MG PO TABS
0.5000 mg | ORAL_TABLET | Freq: Every day | ORAL | Status: DC
  Administered 2017-12-20 – 2017-12-21 (×3): 0.5 mg via ORAL
  Filled 2017-12-19 (×2): qty 1
  Filled 2017-12-19: qty 2

## 2017-12-19 MED ORDER — SODIUM CHLORIDE 0.9% FLUSH
3.0000 mL | Freq: Two times a day (BID) | INTRAVENOUS | Status: DC
  Administered 2017-12-20 – 2017-12-22 (×4): 3 mL via INTRAVENOUS

## 2017-12-19 MED ORDER — ACETAMINOPHEN 650 MG RE SUPP: 650.0000 mg | Freq: Four times a day (QID) | RECTAL | Status: DC | PRN

## 2017-12-19 MED ORDER — ACETAMINOPHEN 325 MG PO TABS: 650.0000 mg | ORAL_TABLET | Freq: Four times a day (QID) | ORAL | Status: DC | PRN

## 2017-12-19 MED ORDER — DULOXETINE HCL 30 MG PO CPEP
30.0000 mg | ORAL_CAPSULE | Freq: Every day | ORAL | Status: DC
  Administered 2017-12-21 – 2017-12-22 (×2): 30 mg via ORAL
  Filled 2017-12-19 (×5): qty 1

## 2017-12-19 MED ORDER — LEVOTHYROXINE SODIUM 50 MCG PO TABS
50.0000 ug | ORAL_TABLET | ORAL | Status: DC
  Administered 2017-12-21 – 2017-12-22 (×2): 50 ug via ORAL
  Filled 2017-12-19 (×4): qty 1

## 2017-12-19 MED ORDER — APIXABAN 5 MG PO TABS
5.0000 mg | ORAL_TABLET | Freq: Two times a day (BID) | ORAL | Status: DC
  Administered 2017-12-20 – 2017-12-22 (×5): 5 mg via ORAL
  Filled 2017-12-19 (×6): qty 1

## 2017-12-19 MED ORDER — TOPIRAMATE 25 MG PO TABS
25.0000 mg | ORAL_TABLET | Freq: Two times a day (BID) | ORAL | Status: DC
  Administered 2017-12-20 – 2017-12-22 (×5): 25 mg via ORAL
  Filled 2017-12-19 (×6): qty 1

## 2017-12-19 NOTE — ED Notes (Signed)
The pts rt arm and hand  Is swollen no voluntary movement from a previous stroke  Hooked back up to bp  Po and monitor wire  The pt reports no problem with swallow

## 2017-12-19 NOTE — ED Triage Notes (Addendum)
Pt states she began having constant mid chest pain radiating into the left neck last night. Pain is currently 6.10 she also has shortness of breath. The family reports that she has a new right sided facial droop that she woke up with. Pt had a stroke the 13th which left her with right sided paralysis. Right sided deficits are the same from prior. Pt is alert and oriented and has no drift to left extremities. Pt family and her state that she has been weaker on the left side and has more weakness than standing since tuesday. Unsure if the right sided facial droop is new or old, the family member states "she might have had it after the last stroke, now I am not sure."

## 2017-12-19 NOTE — H&P (Addendum)
Date: 12/19/2017               Patient Name:  Holly Flores MRN: [id number]  DOB: [birth year] Age / Sex: 72 y.o., female   PCP: Holly Graves, DO         Medical Service: Internal Medicine Teaching Service         Attending Physician: Holly Flores, *    First Contact: Holly Flores Pager: [phone]  Second Contact: Holly Flores Pager: [phone]       After Hours (After 5p/  First Contact Pager: [phone]  weekends / holidays): Second Contact Pager: [phone]   Chief Complaint: Chest pain, new onset left sided weakness  History of Present Illness: 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  She first noticed left-sided weakness on Tuesday with slurred speech and worsening right-sided facial droop.  She was unable to bear any weight on her left side. Denies any falls.  After her last stroke she was in [place] from 9/16 to 10/22 and was able to transfer from her bed to chair and get around with the help of her husband and using a walker.  She endorsed dry cough, headache on the left side as well as sternal chest pain radiating to her left side that she described as a pressure.  She is also been having diarrhea since she was hospitalized during her last stroke.  She said she has 1-2 episodes of diarrhea a day no blood noted.  She denied fevers, nausea, vomiting, shortness of breath, numbness, tingling, or difficulty swallowing. She is having some diffuse abdominal tenderness and distention. Per chart review she is also having left visual field deficits and eyes do not cross midline to the left.    In the ED, she was hypertensive. CXR showed patchy airspace opacities, concerning for multifocal pneumonia though edema cannot be excluded. CT  head showed no acute intracranial abnormality. MRI and MRA pending. Abdominal xray showed gaseous distension of colon, potentially ileus or colonic obstruction.   Meds:  Current Meds  Medication Sig  . acetaminophen (TYLENOL) 325 MG tablet Take 2 tablets (650 mg total) by mouth every 6 (six) hours as needed for mild pain (or Fever >/= 101).  Marland Kitchen albuterol (PROVENTIL HFA;VENTOLIN HFA) 108 (90 Base) MCG/ACT inhaler Inhale 2 puffs into the lungs every 6 (six) hours as needed (for wheezing/shortness of breath).  . ALPRAZolam (XANAX) 0.5 MG tablet Take 1 tablet (0.5 mg total) by mouth at bedtime.  Marland Kitchen apixaban (ELIQUIS) 5 MG TABS tablet Take 1 tablet (5 mg total) by mouth 2 (two) times daily.  . bisacodyl (DULCOLAX) 10 MG suppository Place 1 suppository (10 mg total) rectally daily as needed for moderate constipation.  . cyclobenzaprine (FLEXERIL) 5 MG tablet Take 1 tablet (5 mg total) by mouth at bedtime.  . DULoxetine (CYMBALTA) 30 MG capsule Take 30 mg by mouth daily.  Marland Kitchen esomeprazole (NEXIUM) 40 MG capsule Take 40 mg by mouth every morning.  Marland Kitchen levothyroxine (LEVOTHROID) 50 MCG tablet Take 1 tablet (50 mcg total) by mouth daily before breakfast.  . metoprolol succinate (TOPROL-XL) 50 MG 24 hr tablet Take 50 mg by mouth daily.  Marland Kitchen omega-3 acid ethyl esters (LOVAZA) 1 g capsule Take  2 capsules (2 g total) by mouth daily.  . potassium chloride SA (K-DUR,KLOR-CON) 20 MEQ tablet Take 1 tablet (20 mEq total) by mouth daily.  Marland Kitchen topiramate (TOPAMAX) 25 MG tablet Take 1 tablet (25 mg total) by mouth 2 (two) times daily.     Allergies: Allergies as of 12/19/2017 - Review Complete 12/19/2017  Allergen Reaction Noted  . Penicillins Hives, Itching, and Rash 08/12/2011  . Sulfa antibiotics Swelling 08/12/2011   Past Medical History:  Diagnosis Date  . Anxiety   . Arthritis   . CHF (congestive heart failure) ([place])   . COPD (chronic obstructive pulmonary disease) ([place])   . Depression   . GERD  (gastroesophageal reflux disease)   . Goiter   . Hyperlipidemia   . Hypertension   . Hypothyroidism   . Kidney cysts    left side  . Myocardial infarction ([place])    8 yrs ago.   . 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 Shortness of breath   . Sleep apnea    uses CPAP, 3  . Stroke ([place])   . Stroke ([place])    OCULAR  LEFT  EYE    LAST YR.    Family History:  Family History  Problem Relation Age of Onset  . Cancer Other   . Diabetes Other   . Heart disease Other   . Hypertension Other   . Asthma Other   . Alcoholism Other   . Thyroid disease Other   . Rheumatologic disease Other   . Stroke Other   . Hypercholesterolemia Other   . Colon cancer Neg Hx     Social History:   Patient lives at home with her husband who able to take care her after her last stroke in September 2019.  Her husband has leukemia and receives cancer treatment at [facility].   Review of Systems: A complete ROS was negative except as per HPI.   Physical Exam: Blood pressure (!) 149/96, pulse 71, temperature 98.2 F (36.8 C), resp. rate 20, SpO2 95 %.  Physical Exam  Constitutional: She is oriented to person, place, and time and well-developed, well-nourished, and in no distress.  Eyes: Conjunctivae and EOM are normal.  Cardiovascular: Normal rate, regular rhythm and normal heart sounds.  No murmur heard. Pulmonary/Chest: Effort normal and breath sounds normal. No respiratory distress. She has no wheezes.  Abdominal: Bowel sounds are normal. She exhibits distension. There is tenderness. There is no guarding.  Musculoskeletal: She exhibits edema. She exhibits no tenderness.  Right sided UE +2 pitting edema  Neurological: She is alert and oriented to person, place, and time. No cranial nerve deficit.  Right sided paralysis, left sided strength UE 3/5 and LE 3/5, sensation intact bilaterally, left > right    Skin: Skin is warm and dry. She is not diaphoretic. No erythema.    EKG: personally reviewed my  interpretation is sinus rhythm with premature atrial complexes   CXR: personally reviewed my interpretation is patchy airspace opacities concerning for pneumonia  Assessment & Plan by Problem: Active Problems:   TIA (transient ischemic attack)  Ms. 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   TIA vs left sided weakness  Patient presented with new onset of left sided weakness, worsening right-sided facial  droop and slurred speech that started on Tuesday progressed throughout the week.  CT head MR brain and MRA head showed no acute abnormality or acute infarct.  Possible TIA versus worsening weakness deconditioning secondary to her stroke in September 2019. -Follow-up stroke swallow screen - PT/OT eval and treat  Colonic Pseudobstruction  Severe colonic distension on last admission which required decompression. KUB shows persistent dilatation but improvement from before. Patient reported having 1-2 episodes diarrhea a day since her last hospital admission.  Abdominal film showed gaseous tension of colon, potentially ileus or colonic obstruction. Will keep patient NPO for now.  Right upper extremity swelling Patient has +2 pitting edema right upper extremity.  Follow-up vascular ultrasound of upper extremity venous duplex  Hx of Prior Stroke Patient had a stroke in September 2019.  Presented with right sided weakness, diplopia and difficulty speaking. CT and MRI report, acute infarction left external capsule posteriorly.  Had residual right-sided paralysis of upper extremity and lower extremity.  Hypokalemia Potassium 3.4 on admission.  Plan 1 dose of potassium chloride 40 mEq bid once. -Follow-up a.m. BMP  Atrial Flutter on Eliquis -Continue Eliquis 5  mg twice daily  HTN --Continue metoprolol succinate 50 mg daily  Diastolic CHF Last echo from September 2019 showed LV EF of 60 to 65%.  No regional wall motion abnormalities.  Hypothyroidism -Continue to levothyroxine 80 MCG daily  Depression Anxiety Continue with home medications alprazolam 0.5 mg daily, duloxetine 30 mg daily  Diet: NPO DVT prophylaxis: Lovenox Full Code  Dispo: Admit patient to Inpatient with expected length of stay greater than 2 midnights.  SignedMike Craze, DO 12/19/2017, 6:38 PM  Pager: [phone]

## 2017-12-19 NOTE — ED Notes (Signed)
Neuro at the bedside.

## 2017-12-19 NOTE — Consult Note (Signed)
Referring Physician: Dr. Evette Doffing    Chief Complaint: New onset of left sided weakness  HPI: Holly Flores is an 72 y.o. female presenting with chest pain beginning last night. 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 The patient had a stroke on Oct 13 which left her with right hemiplegia; per family, her rght sided deficits this admission are the same from prior. MRI brain in the ED this evening was negative for acute stroke.   MRI/MRA brain:  1. Negative for acute infarct. Recent left posterior limb internal capsule infarct shows continued evolution. Moderate to extensive chronic microvascular ischemia. 2. No significant intracranial stenosis or large vessel occlusion 3. 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  On Eliquis at home.   Stroke risk factors: Prior stroke, CHF, HLD, HTN, MI and sleep apnea.   Past Medical History:  Diagnosis Date  . Anxiety   . Arthritis   . CHF (congestive heart failure) ([place])   . COPD (chronic obstructive pulmonary disease) ([place])   . Depression   . GERD (gastroesophageal reflux disease)   . Goiter   . Hyperlipidemia   . Hypertension   . Hypothyroidism   . Kidney cysts    left side  . Myocardial infarction ([place])    8 yrs ago.   . 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 Shortness of breath   . Sleep apnea    uses CPAP, 3  . Stroke ([place])   . 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    Past Surgical History:  Procedure Laterality Date  . ABDOMINAL HYSTERECTOMY    . BACK SURGERY     x3,cervical and lunmbar, disc.  Marland Kitchen BREAST BIOPSY     left-non cancerous  . CARDIAC CATHETERIZATION    . CARPAL TUNNEL RELEASE Left   . CATARACT EXTRACTION W/PHACO  09/21/2011   Procedure: CATARACT EXTRACTION PHACO AND INTRAOCULAR LENS PLACEMENT (I[facility]);  Surgeon:  Williams Che, MD;  Location: [facility];  Service: Ophthalmology;  Laterality: Left;  CDE:9.78  . CATARACT EXTRACTION W/PHACO Right 10/15/2014   Procedure: CATARACT EXTRACTION PHACO AND INTRAOCULAR LENS PLACEMENT ([place]);  Surgeon: Williams Che, MD;  Location: [facility];  Service: Ophthalmology;  Laterality: Right;  CDE:4.19  . CERVICAL SPINE SURGERY    . CESAREAN SECTION     x2  . CHOLECYSTECTOMY    . COLONOSCOPY  2006   RMR: 1. Internal hemorroids, otherwise normal rectum. 2. Pedunculated polyp at 35 cm. reomved with snare. The remainder of teh colonic mucosa appeared normal.   . COLONOSCOPY  2011   Dr. Gala Romney: multiple ascending colon polyps and rectal polyp, adenomatous  . COLONOSCOPY N/A 05/31/2014   Procedure: COLONOSCOPY;  Surgeon: Daneil Dolin, MD;  [facility]: [facility];  Service: Endoscopy;  Laterality: N/A;  130  . COLONOSCOPY WITH PROPOFOL N/A 06/24/2017   Procedure: COLONOSCOPY WITH PROPOFOL;  Surgeon: Daneil Dolin, MD;  [facility];  Service: Endoscopy;  Laterality: N/A;  2:15pm  . ESOPHAGOGASTRODUODENOSCOPY  2006   Dr. Gala Romney: Subtle Schatzkis ring, otherwise normal upper GI tract, aside from a small pyloric channell erosion, status post dilation as described above.   Marland Kitchen FLEXIBLE SIGMOIDOSCOPY N/A 11/11/2017   Procedure: FLEXIBLE SIGMOIDOSCOPY;  Surgeon: Otis Brace, MD;  Location: [facility];  Service: Gastroenterology;  Laterality: N/A;  . KNEE SURGERY Bilateral   . POLYPECTOMY  06/24/2017   Procedure: POLYPECTOMY;  Surgeon: Daneil Dolin, MD;  [facility];  Service: Endoscopy;;  ascending  . TONSILLECTOMY    . TOTAL KNEE ARTHROPLASTY Right 05/24/2013   DR MURPHY  . TOTAL KNEE ARTHROPLASTY Right 05/24/2013   Procedure: TOTAL KNEE ARTHROPLASTY;  Surgeon: [name], MD;  Location: [facility];  Service: Orthopedics;  Laterality: Right;    Family History  Problem Relation Age of Onset  . Cancer Other   . Diabetes Other   . Heart disease Other    . Hypertension Other   . Asthma Other   . Alcoholism Other   . Thyroid disease Other   . Rheumatologic disease Other   . Stroke Other   . Hypercholesterolemia Other   . Colon cancer Neg Hx    Social History:  reports that she quit smoking about 30 years ago. Her smoking use included cigarettes. She has a 5.00 pack-year smoking history. She has never used smokeless tobacco. She reports that she drank alcohol. She reports that she does not use drugs.  Allergies:  Allergies  Allergen Reactions  . Penicillins Hives, Itching and Rash    Has patient had a PCN reaction causing immediate rash, facial/tongue/throat swelling, SOB or lightheadedness with hypotension: Yes Has patient had a PCN reaction causing severe rash involving mucus membranes or skin necrosis: Yes Has patient had a PCN reaction that required hospitalization: No Has patient had a PCN reaction occurring within the last 10 years: No If all of the above answers are "NO", then may proceed with Cephalosporin use.   . Sulfa Antibiotics Swelling    Tongue swelling    Home Medications:  Current Meds  Medication Sig  . acetaminophen (TYLENOL) 325 MG tablet Take 2 tablets (650 mg total) by mouth every 6 (six) hours as needed for mild pain (or Fever >/= 101).  Marland Kitchen albuterol (PROVENTIL HFA;VENTOLIN HFA) 108 (90 Base) MCG/ACT inhaler Inhale 2 puffs into the lungs every 6 (six) hours as needed (for wheezing/shortness of breath).  . ALPRAZolam (XANAX) 0.5 MG tablet Take 1 tablet (0.5 mg total) by mouth at bedtime.  Marland Kitchen apixaban (ELIQUIS) 5 MG TABS tablet Take 1 tablet (5 mg total) by mouth 2 (two) times daily.  . bisacodyl (DULCOLAX) 10 MG suppository Place 1 suppository (10 mg total) rectally daily as needed for moderate constipation.  . cyclobenzaprine (FLEXERIL) 5 MG tablet Take 1 tablet (5 mg total) by mouth at bedtime.  . DULoxetine (CYMBALTA) 30 MG capsule Take 30 mg by mouth daily.  Marland Kitchen esomeprazole (NEXIUM) 40 MG capsule Take 40 mg  by mouth every morning.  Marland Kitchen levothyroxine (LEVOTHROID) 50 MCG tablet Take 1 tablet (50 mcg total) by mouth daily before breakfast.  . metoprolol succinate (TOPROL-XL) 50 MG 24 hr tablet Take 50 mg by mouth daily.  Marland Kitchen omega-3 acid ethyl esters (LOVAZA) 1 g capsule Take 2 capsules (2 g total) by mouth daily.  . potassium chloride SA (K-DUR,KLOR-CON) 20 MEQ tablet Take 1 tablet (20 mEq total) by mouth daily.  Marland Kitchen topiramate (TOPAMAX) 25 MG tablet Take 1 tablet (25 mg total) by mouth 2 (two) times daily.      ROS: Left sided 7/10 headache. No neck pain. No CP. Positive for abdominal discomfort and diarrhea. No vision loss, dysphasia or confusion. No dysphagia. No dysuria. Other ROS as per HPI.   Physical Examination: Blood pressure (!) 149/96, pulse 71, temperature 98.2  F (36.8 C), resp. rate 20, SpO2 95 %.  HEENT: [place]/AT Lungs: Respirations unlabored Ext: Warm and well perfused.   Neurologic Examination: Mental Status: Awake and alert. Dysthymic affect. Oriented x 5. Speech dysarthric but fluent with intact comprehension and naming. Able to follow a 2 step directional command without difficulty. Cranial Nerves: II:  Visual fields intact all 4 quadrants OS and OD. No extinction to DSS. PERRL.  III,IV, VI: No ptosis. EOMI without nystagmus.   V,VII: Prominent right facial droop. Decreased sensation on the right.  VIII: hearing intact to voice IX,X: Unable to visualize palatal margin XI: Decreased shoulder shrug on right XII: midline tongue extension  Motor: RUE: 0/5 proximal and distal RLE: 1-2/5 internal/external rotation at hip, otherwise 0/5 LUE and LLE: 5/5 Sensory: Decreased temp sensation RUE and RLE. Severely impaired FT sensation RUE and RLE.  Deep Tendon Reflexes:  3+ right biceps and brachioradialis. + Hoffman's right. 3+ right patella, 4+ right achilles. Sustained clonus with forced dorsiflexion of right foot 2+ left biceps and brachioradialis Low-amplitude 4+ left patella  (crossed adductor) 0 left achilles Plantars: Right: downgoing   Left: downgoing Cerebellar: No ataxia with FNF on the left. Unable to perform on the right.  Gait: Unable to assess  Results for orders placed or performed during the hospital encounter of 12/19/17 (from the past 48 hour(s))  Basic metabolic panel     Status: Abnormal   Collection Time: 12/19/17  1:18 PM  Result Value Ref Range   Sodium 138 135 - 145 mmol/L   Potassium 3.4 (L) 3.5 - 5.1 mmol/L   Chloride 108 98 - 111 mmol/L   CO2 22 22 - 32 mmol/L   Glucose, Bld 116 (H) 70 - 99 mg/dL   BUN 11 8 - 23 mg/dL   Creatinine, Ser 1.16 (H) 0.44 - 1.00 mg/dL   Calcium 9.2 8.9 - 10.3 mg/dL   GFR calc non Af Amer 46 (L) >60 mL/min   GFR calc Af Amer 53 (L) >60 mL/min    Comment: (NOTE) The eGFR has been calculated using the CKD EPI equation. This calculation has not been validated in all clinical situations. eGFR's persistently <60 mL/min signify possible Chronic Kidney Disease.    Anion gap 8 5 - 15    Comment: Performed at [facility] [address], [place] [address]  CBC     Status: None   Collection Time: 12/19/17  1:18 PM  Result Value Ref Range   WBC 7.5 4.0 - 10.5 K/uL   RBC 4.83 3.87 - 5.11 MIL/uL   Hemoglobin 14.1 12.0 - 15.0 g/dL   HCT 45.3 36.0 - 46.0 %   MCV 93.8 80.0 - 100.0 fL   MCH 29.2 26.0 - 34.0 pg   MCHC 31.1 30.0 - 36.0 g/dL   RDW 13.1 11.5 - 15.5 %   Platelets 296 150 - 400 K/uL   nRBC 0.0 0.0 - 0.2 %    Comment: Performed at [facility], [place] [address] [place], [place] [address]  Protime-INR     Status: Abnormal   Collection Time: 12/19/17  1:18 PM  Result Value Ref Range   Prothrombin Time 16.9 (H) 11.4 - 15.2 seconds   INR 1.39     Comment: Performed at [facility] [address], [place], [place] [address]  APTT     Status: Abnormal   Collection Time: 12/19/17  1:18 PM  Result Value Ref Range   aPTT  39 (H) 24 - 36 seconds    Comment:        IF BASELINE  aPTT IS ELEVATED, SUGGEST PATIENT RISK ASSESSMENT BE USED TO DETERMINE APPROPRIATE ANTICOAGULANT THERAPY. Performed at [facility], [place] [address] [place], [place] [address]   Brain natriuretic peptide     Status: None   Collection Time: 12/19/17  1:18 PM  Result Value Ref Range   B Natriuretic Peptide 48.3 0.0 - 100.0 pg/mL    Comment: Performed at [facility] [address] [place], [place] [address]  I-stat troponin, ED     Status: None   Collection Time: 12/19/17  2:01 PM  Result Value Ref Range   Troponin i, poc 0.00 0.00 - 0.08 ng/mL   Comment 3            Comment: Due to the release kinetics of cTnI, a negative result within the first hours of the onset of symptoms does not rule out myocardial infarction with certainty. If myocardial infarction is still suspected, repeat the test at appropriate intervals.    Dg Chest 2 View  Result Date: 12/19/2017 CLINICAL DATA:  72 year old female with a history of mid chest pain EXAM: CHEST - 2 VIEW COMPARISON:  11/02/2017, 01/16/2015 FINDINGS: Cardiomediastinal silhouette unchanged in size and contour. Low lung volumes. Patchy opacities bilaterally, new from the prior. No pneumothorax or pleural effusion. Surgical changes of the cervical region. IMPRESSION: Patchy airspace opacities, new from the prior, concerning for multifocal pneumonia though edema cannot be excluded. Electronically Signed   By: Corrie Mckusick D.O.   On: 12/19/2017 15:06   Ct Head Wo Contrast  Result Date: 12/19/2017 CLINICAL DATA:  72 year old female with a history chest pain EXAM: CT HEAD WITHOUT CONTRAST TECHNIQUE: Contiguous axial images were obtained from the base of the skull through the vertex without intravenous contrast. COMPARISON:  CT 10/29/2017, MR 10/29/2017 FINDINGS: Brain: No acute intracranial hemorrhage. No midline shift or mass effect. Gray-white differentiation maintained. Involving hypodensity in the posterior left basal ganglia.  Unremarkable configuration of the ventricles. Vascular: Calcifications of intracranial vasculature. Skull: No acute displaced fracture.  No aggressive bony lesions. Sinuses/Orbits: No acute finding. Other: None. IMPRESSION: No acute intracranial abnormality. Evolution of known left basal ganglia infarction. Electronically Signed   By: Corrie Mckusick D.O.   On: 12/19/2017 15:12   Mr Jodene Nam Head Wo Contrast  Result Date: 12/19/2017 CLINICAL DATA:  Focal neuro deficit, suspect stroke.  Recent stroke. EXAM: MRI HEAD WITHOUT CONTRAST MRA HEAD WITHOUT CONTRAST TECHNIQUE: Multiplanar, multiecho pulse sequences of the brain and surrounding structures were obtained without intravenous contrast. Angiographic images of the head were obtained using MRA technique without contrast. COMPARISON:  CT head 12/19/2017, MRI head 10/29/2017 FINDINGS: MRI HEAD FINDINGS Brain: Resolving moderately large infarct in the posterior limb internal capsule on the left. Moderate to extensive chronic microvascular ischemic changes in the white matter. Mild chronic ischemic change in the thalamus bilaterally. Ventricle size normal.  Mild atrophy.  No mass lesion identified. Negative for acute infarct. Chronic microhemorrhage left internal capsule unchanged from the prior study. Vascular: Normal arterial flow void Skull and upper cervical spine: Negative.  ACDF cervical spine Sinuses/Orbits: Paranasal sinuses clear. Bilateral cataract surgery. Other: None MRA HEAD FINDINGS Both vertebral arteries patent to the basilar. Left PICA patent. Right PICA not visualized however there is a prominent right AICA which may supply this territory. Basilar widely patent. Posterior cerebral arteries patent bilaterally. Superior cerebellar arteries patent bilaterally. 3 mm  aneurysm proximal right posterior cerebral artery projecting inferiorly between the superior cerebellar artery and posterior cerebral artery. Internal carotid artery widely patent bilaterally.  Anterior and middle cerebral arteries patent bilaterally without significant stenosis or large vessel occlusion. IMPRESSION: 1. Negative for acute infarct. Recent left posterior limb internal capsule infarct shows continued evolution. Moderate to extensive chronic microvascular ischemia. 2. No significant intracranial stenosis or large vessel occlusion 3. 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 Electronically Signed   By: Franchot Gallo M.D.   On: 12/19/2017 18:19   Mr Brain Wo Contrast  Result Date: 12/19/2017 CLINICAL DATA:  Focal neuro deficit, suspect stroke.  Recent stroke. EXAM: MRI HEAD WITHOUT CONTRAST MRA HEAD WITHOUT CONTRAST TECHNIQUE: Multiplanar, multiecho pulse sequences of the brain and surrounding structures were obtained without intravenous contrast. Angiographic images of the head were obtained using MRA technique without contrast. COMPARISON:  CT head 12/19/2017, MRI head 10/29/2017 FINDINGS: MRI HEAD FINDINGS Brain: Resolving moderately large infarct in the posterior limb internal capsule on the left. Moderate to extensive chronic microvascular ischemic changes in the white matter. Mild chronic ischemic change in the thalamus bilaterally. Ventricle size normal.  Mild atrophy.  No mass lesion identified. Negative for acute infarct. Chronic microhemorrhage left internal capsule unchanged from the prior study. Vascular: Normal arterial flow void Skull and upper cervical spine: Negative.  ACDF cervical spine Sinuses/Orbits: Paranasal sinuses clear. Bilateral cataract surgery. Other: None MRA HEAD FINDINGS Both vertebral arteries patent to the basilar. Left PICA patent. Right PICA not visualized however there is a prominent right AICA which may supply this territory. Basilar widely patent. Posterior cerebral arteries patent bilaterally. Superior cerebellar arteries patent bilaterally. 3 mm aneurysm proximal right posterior cerebral artery projecting inferiorly between the  superior cerebellar artery and posterior cerebral artery. Internal carotid artery widely patent bilaterally. Anterior and middle cerebral arteries patent bilaterally without significant stenosis or large vessel occlusion. IMPRESSION: 1. Negative for acute infarct. Recent left posterior limb internal capsule infarct shows continued evolution. Moderate to extensive chronic microvascular ischemia. 2. No significant intracranial stenosis or large vessel occlusion 3. 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 Electronically Signed   By: Franchot Gallo M.D.   On: 12/19/2017 18:19   Dg Abd 2 Views  Result Date: 12/19/2017 CLINICAL DATA:  72 year old female with a history of chest pain EXAM: ABDOMEN - 2 VIEW COMPARISON:  11/14/2017, 11/13/2017, 11/12/2017 FINDINGS: Gaseous distention of colon, predominantly transverse colon. Gas extends through descending colon. Small amount of gas in the stomach and small bowel. Surgical clips of cholecystectomy. Surgical changes of the lumbar region. No displaced fracture. IMPRESSION: Gaseous distention of colon, potentially ileus or colonic obstruction. Electronically Signed   By: Corrie Mckusick D.O.   On: 12/19/2017 15:05   EKG:  Sinus rhythm with Premature atrial complexes Pulmonary disease pattern Left anterior fascicular block Prolonged QT Abnormal ECG  Assessment: 72 y.o. female presenting with gradually increasing subjective left sided weakness since being discharged home following a left internal capsule/lateral thalamus ischemic infarction.  1. Has left hemiplegia at baseline secondary to the recent stroke, which occurred on October 13 of this year. Exam findings are consistent with this. No definite new left sided abnormality is appreciated on neurological exam.  2. MRI brain is negative for acute infarct. Recent left posterior limb internal capsule infarct shows continued evolution. Moderate to extensive chronic microvascular ischemia.  3.  MRA head: No significant intracranial stenosis or large vessel occlusion  Incidental 3 mm aneurysm right  posterior cerebral artery origin. 4. Stroke Risk Factors - Prior stroke, CHF, HLD, HTN, MI and sleep apnea. 5. On Eliquis at home. No listed history of DVT, PE, cardiac thrombus or a-fib. Patient only able to state that she was started on anticoagulation due to "a blood clot from my heart that caused the stroke". She states it was prescribed by her Cardiologist in [place].   Plan: 1. Given that MRI brain was negative while new left sided symptoms were ongoing, and MRA shows no arterial occlusion, stroke and TIA are essentially ruled out.  2. Continue Eliquis. Would contact her Cardiologist in [place] to determine the indication for this medication, as she has no listed history of DVT, PE, cardiac thrombus or a-fib. 3. PT consult, OT consult, Speech consult 4. Telemetry monitoring 5. Frequent neuro checks 6. BP management  '@Electronically'  signed: Dr. Kerney Elbe  12/19/2017, 8:14 PM

## 2017-12-19 NOTE — ED Notes (Signed)
Patient transported to MRI 

## 2017-12-19 NOTE — ED Provider Notes (Addendum)
[facility] EMERGENCY DEPARTMENT Provider Note   CSN: [id number] Arrival date & time: 12/19/17  1250     History   Chief Complaint Chief Complaint  Patient presents with  . Chest Pain    HPI Holly Flores is a 72 y.o. female.  HPI Patient presents with a few different complaints.  Has had chest pain that goes to the neck.  Dull.  Also shortness of breath.  Occasional cough.  No fevers.  Did have a stroke around 2 months ago.  Has chronic right-sided weakness with that.  Does have worsening weakness now on the left side.  Family states that is been worsening for the last couple days.  No headache.  No real confusion.  Had had a facial droop with a previous stroke but states it is gotten worse recently.  No dysuria. Abdomen also has become more distended.  States she had her hospital stay complicated by ileus and feels that could be coming back. History counseled from patient and family members with her. Past Medical History:  Diagnosis Date  . Anxiety   . Arthritis   . CHF (congestive heart failure) ([place])   . COPD (chronic obstructive pulmonary disease) ([place])   . Depression   . GERD (gastroesophageal reflux disease)   . Goiter   . Hyperlipidemia   . Hypertension   . Hypothyroidism   . Kidney cysts    left side  . Myocardial infarction ([place])    8 yrs ago.   . 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 Shortness of breath   . Sleep apnea    uses CPAP, 3  . Stroke ([place])   . Stroke ([place])    OCULAR  LEFT  EYE    LAST YR.    Patient Active Problem List   Diagnosis Date Noted  . Stage 2 chronic kidney disease   . Benign essential HTN   . Acute blood loss anemia   . Chronic kidney disease (CKD), stage III (moderate) (HCC)   . Labile blood pressure   . Hypoalbuminemia due to protein-calorie malnutrition ([place])   . Hypokalemia   . Intractable migraine without status migrainosus   . Reactive depression   . Abdominal distention   . Ileus ([place])   . 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 Hemiparesis affecting right side as late effect of stroke ([place])   . Abdominal distension   . Acute kidney injury superimposed on chronic kidney disease ([place])   . 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 Acute CVA (cerebrovascular accident) ([place])   . Slow transit constipation   . CKD (chronic kidney disease), stage II   . PAF (paroxysmal atrial fibrillation) ([place])   . Dysphasia, post-stroke   . 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 COPD (chronic obstructive pulmonary disease) ([place]) 10/29/2017  . Chronic diastolic CHF (congestive heart failure) ([place]) 10/29/2017  . Hypothyroidism 10/29/2017  . Essential hypertension 10/29/2017  . GERD (gastroesophageal reflux disease) 10/29/2017  . Anxiety 10/29/2017  . Taking medication for chronic disease 05/18/2017  . Central retinal vein occlusion of left eye 01/22/2015  . Degenerative disc disease, cervical 01/22/2015  . HA (headache) 12/30/2014  . CVA (cerebral vascular accident) ([place]) 12/30/2014  . CVA (cerebral infarction) 12/30/2014  . History of colonic polyps 05/14/2014  . 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 Chest pain 04/20/2013  . Dyspnea 04/20/2013  . Hyperlipidemia 04/20/2013    Past Surgical History:  Procedure Laterality Date  . ABDOMINAL  HYSTERECTOMY    . BACK SURGERY     x3,cervical and lunmbar, disc.  Marland Kitchen BREAST BIOPSY     left-non cancerous  . CARDIAC CATHETERIZATION    . CARPAL TUNNEL RELEASE Left   . CATARACT EXTRACTION W/PHACO  09/21/2011   Procedure: CATARACT EXTRACTION PHACO AND INTRAOCULAR LENS PLACEMENT (IOC);  Surgeon: Williams Che, MD;  Location: [facility];  Service: Ophthalmology;  Laterality: Left;  CDE:9.78  . CATARACT EXTRACTION W/PHACO Right 10/15/2014   Procedure: CATARACT EXTRACTION PHACO AND INTRAOCULAR LENS PLACEMENT ([place]);  Surgeon: Williams Che, MD;  Location: [facility];  Service: Ophthalmology;  Laterality: Right;  CDE:4.19  . CERVICAL SPINE SURGERY      . CESAREAN SECTION     x2  . CHOLECYSTECTOMY    . COLONOSCOPY  2006   RMR: 1. Internal hemorroids, otherwise normal rectum. 2. Pedunculated polyp at 35 cm. reomved with snare. The remainder of teh colonic mucosa appeared normal.   . COLONOSCOPY  2011   Dr. Gala Romney: multiple ascending colon polyps and rectal polyp, adenomatous  . COLONOSCOPY N/A 05/31/2014   Procedure: COLONOSCOPY;  Surgeon: Daneil Dolin, MD;  Location: [facility];  Service: Endoscopy;  Laterality: N/A;  130  . COLONOSCOPY WITH PROPOFOL N/A 06/24/2017   Procedure: COLONOSCOPY WITH PROPOFOL;  Surgeon: Daneil Dolin, MD;  Location: [facility];  Service: Endoscopy;  Laterality: N/A;  2:15pm  . ESOPHAGOGASTRODUODENOSCOPY  2006   Dr. Gala Romney: Subtle Schatzkis ring, otherwise normal upper GI tract, aside from a small pyloric channell erosion, status post dilation as described above.   Marland Kitchen FLEXIBLE SIGMOIDOSCOPY N/A 11/11/2017   Procedure: FLEXIBLE SIGMOIDOSCOPY;  Surgeon: Otis Brace, MD;  Location: [facility];  Service: Gastroenterology;  Laterality: N/A;  . KNEE SURGERY Bilateral   . POLYPECTOMY  06/24/2017   Procedure: POLYPECTOMY;  Surgeon: Daneil Dolin, MD;  Location: [facility];  Service: Endoscopy;;  ascending  . TONSILLECTOMY    . TOTAL KNEE ARTHROPLASTY Right 05/24/2013   DR MURPHY  . TOTAL KNEE ARTHROPLASTY Right 05/24/2013   Procedure: TOTAL KNEE ARTHROPLASTY;  Surgeon: [name], MD;  Location: [facility];  Service: Orthopedics;  Laterality: Right;     OB History   None      Home Medications    Prior to Admission medications   Medication Sig Start Date End Date Taking? Authorizing Provider  acetaminophen (TYLENOL) 325 MG tablet Take 2 tablets (650 mg total) by mouth every 6 (six) hours as needed for mild pain (or Fever >/= 101). 12/08/17  Yes Angiulli, Lavon Paganini, PA-C  albuterol (PROVENTIL HFA;VENTOLIN HFA) 108 (90 Base) MCG/ACT inhaler Inhale 2 puffs into the lungs every 6 (six) hours as needed  (for wheezing/shortness of breath). 12/08/17  Yes Angiulli, Lavon Paganini, PA-C  ALPRAZolam Duanne Moron) 0.5 MG tablet Take 1 tablet (0.5 mg total) by mouth at bedtime. 12/08/17  Yes Angiulli, Lavon Paganini, PA-C  apixaban (ELIQUIS) 5 MG TABS tablet Take 1 tablet (5 mg total) by mouth 2 (two) times daily. 12/08/17  Yes Angiulli, Lavon Paganini, PA-C  bisacodyl (DULCOLAX) 10 MG suppository Place 1 suppository (10 mg total) rectally daily as needed for moderate constipation. 11/15/17  Yes Thurnell Lose, MD  cyclobenzaprine (FLEXERIL) 5 MG tablet Take 1 tablet (5 mg total) by mouth at bedtime. 12/08/17  Yes Angiulli, Lavon Paganini, PA-C  DULoxetine (CYMBALTA) 30 MG capsule Take 30 mg by mouth daily.   Yes [provider]  esomeprazole (NEXIUM) 40 MG capsule Take  40 mg by mouth every morning.   Yes [provider]  levothyroxine (LEVOTHROID) 50 MCG tablet Take 1 tablet (50 mcg total) by mouth daily before breakfast. 12/08/17  Yes Angiulli, Lavon Paganini, PA-C  metoprolol succinate (TOPROL-XL) 50 MG 24 hr tablet Take 50 mg by mouth daily.   Yes [provider]  omega-3 acid ethyl esters (LOVAZA) 1 g capsule Take 2 capsules (2 g total) by mouth daily. 11/09/17  Yes Angiulli, Lavon Paganini, PA-C  potassium chloride SA (K-DUR,KLOR-CON) 20 MEQ tablet Take 1 tablet (20 mEq total) by mouth daily. 12/08/17  Yes Angiulli, Lavon Paganini, PA-C  topiramate (TOPAMAX) 25 MG tablet Take 1 tablet (25 mg total) by mouth 2 (two) times daily. 12/08/17  Yes Angiulli, Lavon Paganini, PA-C  atorvastatin (LIPITOR) 80 MG tablet Take 1 tablet (80 mg total) by mouth daily. Patient not taking: Reported on 12/19/2017 12/08/17   Angiulli, Lavon Paganini, PA-C  pantoprazole (PROTONIX) 40 MG tablet Take 1 tablet (40 mg total) by mouth daily. Patient not taking: Reported on 12/19/2017 12/08/17   Angiulli, Lavon Paganini, PA-C    Family History Family History  Problem Relation Age of Onset  . Cancer Other   . Diabetes Other   . Heart disease Other   .  Hypertension Other   . Asthma Other   . Alcoholism Other   . Thyroid disease Other   . Rheumatologic disease Other   . Stroke Other   . Hypercholesterolemia Other   . Colon cancer Neg Hx     Social History Social History   Tobacco Use  . Smoking status: Former Smoker    Packs/day: 0.50    Years: 10.00    Pack years: 5.00    Types: Cigarettes    Last attempt to quit: 04/21/1987    Years since quitting: 30.6  . Smokeless tobacco: Never Used  . Tobacco comment: smokes cigarette if she has a headache which helps.  Substance Use Topics  . Alcohol use: Not Currently    Comment: previously 2 glasses of wine a day  . Drug use: No     Allergies   Penicillins and Sulfa antibiotics   Review of Systems Review of Systems  Constitutional: Negative for appetite change.  HENT: Negative for congestion.   Respiratory: Positive for shortness of breath.   Cardiovascular: Positive for chest pain.  Gastrointestinal: Negative for abdominal pain.  Genitourinary: Negative for flank pain.  Musculoskeletal: Negative for back pain.  Neurological: Positive for weakness. Negative for numbness.  Psychiatric/Behavioral: Negative for confusion.     Physical Exam Updated Vital Signs BP (!) 149/92   Pulse 82   Temp 98.2 F (36.8 C)   Resp 20   SpO2 95%   Physical Exam  Constitutional: She appears well-developed.  HENT:  Head: Normocephalic.  Eyes: Pupils are equal, round, and reactive to light.  Neck: Neck supple.  Cardiovascular: Normal rate and regular rhythm.  Pulmonary/Chest:  Mildly harsh breath sounds.  Abdominal:  Mild distention.  Musculoskeletal:  Some edema on right side.  Neurological: She is alert.  Patient is awake and able answer questions.  Chronic weakness on right side.  Really unable to raise arm.  Also unable to raise right leg.  This is reportedly chronic.  Some strength on left side but reportedly not at baseline.  Able to raise up and does not have drift but  not normal strength.  Also some straight leg raise on left side.  Sensation grossly intact bilaterally.  However  eyes will not cross midline to the left.  Also has left-sided visual field cut.  Skin: Skin is warm.     ED Treatments / Results  Labs (all labs ordered are listed, but only abnormal results are displayed) Labs Reviewed  BASIC METABOLIC PANEL - Abnormal; Notable for the following components:      Result Value   Potassium 3.4 (*)    Glucose, Bld 116 (*)    Creatinine, Ser 1.16 (*)    GFR calc non Af Amer 46 (*)    GFR calc Af Amer 53 (*)    All other components within normal limits  PROTIME-INR - Abnormal; Notable for the following components:   Prothrombin Time 16.9 (*)    All other components within normal limits  APTT - Abnormal; Notable for the following components:   aPTT 39 (*)    All other components within normal limits  CBC  BRAIN NATRIURETIC PEPTIDE  I-STAT TROPONIN, ED  CBG MONITORING, ED    EKG EKG Interpretation  Date/Time:  Sunday December 19 2017 12:55:09 EST Ventricular Rate:  96 PR Interval:  202 QRS Duration: 86 QT Interval:  388 QTC Calculation: 490 R Axis:   -64 Text Interpretation:  Sinus rhythm with Premature atrial complexes Pulmonary disease pattern Left anterior fascicular block Prolonged QT Abnormal ECG Confirmed by Davonna Belling [phone]) on 12/19/2017 1:45:40 PM   Radiology Dg Chest 2 View  Result Date: 12/19/2017 CLINICAL DATA:  72 year old female with a history of mid chest pain EXAM: CHEST - 2 VIEW COMPARISON:  11/02/2017, 01/16/2015 FINDINGS: Cardiomediastinal silhouette unchanged in size and contour. Low lung volumes. Patchy opacities bilaterally, new from the prior. No pneumothorax or pleural effusion. Surgical changes of the cervical region. IMPRESSION: Patchy airspace opacities, new from the prior, concerning for multifocal pneumonia though edema cannot be excluded. Electronically Signed   By: Corrie Mckusick D.O.   On:  12/19/2017 15:06   Ct Head Wo Contrast  Result Date: 12/19/2017 CLINICAL DATA:  72 year old female with a history chest pain EXAM: CT HEAD WITHOUT CONTRAST TECHNIQUE: Contiguous axial images were obtained from the base of the skull through the vertex without intravenous contrast. COMPARISON:  CT 10/29/2017, MR 10/29/2017 FINDINGS: Brain: No acute intracranial hemorrhage. No midline shift or mass effect. Gray-white differentiation maintained. Involving hypodensity in the posterior left basal ganglia. Unremarkable configuration of the ventricles. Vascular: Calcifications of intracranial vasculature. Skull: No acute displaced fracture.  No aggressive bony lesions. Sinuses/Orbits: No acute finding. Other: None. IMPRESSION: No acute intracranial abnormality. Evolution of known left basal ganglia infarction. Electronically Signed   By: Corrie Mckusick D.O.   On: 12/19/2017 15:12   Dg Abd 2 Views  Result Date: 12/19/2017 CLINICAL DATA:  72 year old female with a history of chest pain EXAM: ABDOMEN - 2 VIEW COMPARISON:  11/14/2017, 11/13/2017, 11/12/2017 FINDINGS: Gaseous distention of colon, predominantly transverse colon. Gas extends through descending colon. Small amount of gas in the stomach and small bowel. Surgical clips of cholecystectomy. Surgical changes of the lumbar region. No displaced fracture. IMPRESSION: Gaseous distention of colon, potentially ileus or colonic obstruction. Electronically Signed   By: Corrie Mckusick D.O.   On: 12/19/2017 15:05    Procedures Procedures (including critical care time)  Medications Ordered in ED Medications - No data to display   Initial Impression / Assessment and Plan / ED Course  I have reviewed the triage vital signs and the nursing notes.  Pertinent labs & imaging results that were available during my care of  the patient were reviewed by me and considered in my medical decision making (see chart for details).     Patient with few different complaints.   Chest pain with shortness of breath and x-ray shows possible pneumonia.  Abdominal x-ray shows possible ileus.  Also has neuro deficits.  Increasing left-sided weakness when she has chronic right-sided weakness.  Also left visual field cut and eyes will not cross midline to the left.  However does have sensation bilaterally and does not neglect except for the visual field on the left side.  Discussed with Dr. Leonel Ramsay.  Initial CT reassuring but will add on MRI and MRA to evaluate for new stroke.  She is Artie on anticoagulation.  X-ray showed possible pneumonia and will treat with antibiotics.  Will admit to hospitalist.  Patient not a TPA candidate due to already being on anticoagulation and the fact that she has had neuro deficits for the last day or 2.  With reevaluation now the neuro deficits improved.  Patient now has lost visual field cut to the left side and now can move her eyes past the midline.  Appears to have some more movement of the left side now.  Likely either TIA or systemic response to infection.  Focal seizure also considered.  Will admit to unassigned internal medicine.  Final Clinical Impressions(s) / ED Diagnoses   Final diagnoses:  Abdominal distention  Pneumonia due to infectious organism, unspecified laterality, unspecified part of lung  Cerebrovascular accident (CVA), unspecified mechanism ([place])  TIA (transient ischemic attack)    ED Discharge Orders    None       Davonna Belling, MD 12/19/17 1525    Davonna Belling, MD 12/19/17 1526    Davonna Belling, MD 12/19/17 1546

## 2017-12-20 ENCOUNTER — Inpatient Hospital Stay (HOSPITAL_COMMUNITY): Payer: Medicare Other

## 2017-12-20 ENCOUNTER — Encounter (HOSPITAL_COMMUNITY): Payer: Self-pay | Admitting: Student in an Organized Health Care Education/Training Program

## 2017-12-20 ENCOUNTER — Encounter: Payer: Medicare Other | Attending: Registered Nurse | Admitting: Registered Nurse

## 2017-12-20 ENCOUNTER — Other Ambulatory Visit: Payer: Self-pay

## 2017-12-20 DIAGNOSIS — G43109 Migraine with aura, not intractable, without status migrainosus: Secondary | ICD-10-CM

## 2017-12-20 DIAGNOSIS — I69351 Hemiplegia and hemiparesis following cerebral infarction affecting right dominant side: Principal | ICD-10-CM

## 2017-12-20 DIAGNOSIS — J449 Chronic obstructive pulmonary disease, unspecified: Secondary | ICD-10-CM

## 2017-12-20 DIAGNOSIS — R109 Unspecified abdominal pain: Secondary | ICD-10-CM

## 2017-12-20 DIAGNOSIS — I130 Hypertensive heart and chronic kidney disease with heart failure and stage 1 through stage 4 chronic kidney disease, or unspecified chronic kidney disease: Secondary | ICD-10-CM

## 2017-12-20 DIAGNOSIS — K5901 Slow transit constipation: Secondary | ICD-10-CM

## 2017-12-20 DIAGNOSIS — N182 Chronic kidney disease, stage 2 (mild): Secondary | ICD-10-CM

## 2017-12-20 DIAGNOSIS — K598 Other specified functional intestinal disorders: Secondary | ICD-10-CM

## 2017-12-20 DIAGNOSIS — M7989 Other specified soft tissue disorders: Secondary | ICD-10-CM

## 2017-12-20 DIAGNOSIS — R079 Chest pain, unspecified: Secondary | ICD-10-CM

## 2017-12-20 DIAGNOSIS — I480 Paroxysmal atrial fibrillation: Secondary | ICD-10-CM

## 2017-12-20 DIAGNOSIS — I5032 Chronic diastolic (congestive) heart failure: Secondary | ICD-10-CM

## 2017-12-20 DIAGNOSIS — R531 Weakness: Secondary | ICD-10-CM

## 2017-12-20 DIAGNOSIS — K219 Gastro-esophageal reflux disease without esophagitis: Secondary | ICD-10-CM

## 2017-12-20 DIAGNOSIS — Z7901 Long term (current) use of anticoagulants: Secondary | ICD-10-CM

## 2017-12-20 DIAGNOSIS — I69392 Facial weakness following cerebral infarction: Secondary | ICD-10-CM

## 2017-12-20 DIAGNOSIS — E785 Hyperlipidemia, unspecified: Secondary | ICD-10-CM

## 2017-12-20 DIAGNOSIS — R609 Edema, unspecified: Secondary | ICD-10-CM

## 2017-12-20 DIAGNOSIS — Z8673 Personal history of transient ischemic attack (TIA), and cerebral infarction without residual deficits: Secondary | ICD-10-CM

## 2017-12-20 DIAGNOSIS — G459 Transient cerebral ischemic attack, unspecified: Secondary | ICD-10-CM

## 2017-12-20 DIAGNOSIS — E876 Hypokalemia: Secondary | ICD-10-CM

## 2017-12-20 LAB — BASIC METABOLIC PANEL
Anion gap: 7 (ref 5–15)
BUN: 11 mg/dL (ref 8–23)
CHLORIDE: 107 mmol/L (ref 98–111)
CO2: 23 mmol/L (ref 22–32)
Calcium: 9 mg/dL (ref 8.9–10.3)
Creatinine, Ser: 1.13 mg/dL — ABNORMAL HIGH (ref 0.44–1.00)
GFR, EST AFRICAN AMERICAN: 55 mL/min — AB (ref >60)
GFR, EST NON AFRICAN AMERICAN: 47 mL/min — AB (ref >60)
Glucose, Bld: 105 mg/dL — ABNORMAL HIGH (ref 70–99)
Potassium: 3.4 mmol/L — ABNORMAL LOW (ref 3.5–5.1)
SODIUM: 137 mmol/L (ref 135–145)

## 2017-12-20 LAB — MAGNESIUM: MAGNESIUM: 2.2 mg/dL (ref 1.7–2.4)

## 2017-12-20 MED ORDER — LISINOPRIL 10 MG PO TABS
10.0000 mg | ORAL_TABLET | Freq: Every day | ORAL | Status: DC
  Administered 2017-12-20 – 2017-12-22 (×3): 10 mg via ORAL
  Filled 2017-12-20 (×3): qty 1

## 2017-12-20 MED ORDER — POLYETHYLENE GLYCOL 3350 17 G PO PACK: 17.0000 g | PACK | Freq: Every day | ORAL | Status: DC | PRN

## 2017-12-20 MED ORDER — SENNOSIDES-DOCUSATE SODIUM 8.6-50 MG PO TABS
1.0000 | ORAL_TABLET | Freq: Two times a day (BID) | ORAL | Status: DC
  Administered 2017-12-20 (×2): 1 via ORAL
  Filled 2017-12-20 (×2): qty 1

## 2017-12-20 MED ORDER — ATORVASTATIN CALCIUM 20 MG PO TABS
80.0000 mg | ORAL_TABLET | Freq: Every day | ORAL | Status: DC
  Administered 2017-12-20 – 2017-12-22 (×3): 80 mg via ORAL
  Filled 2017-12-20 (×3): qty 4

## 2017-12-20 MED ORDER — POTASSIUM CHLORIDE CRYS ER 20 MEQ PO TBCR: 40.0000 meq | EXTENDED_RELEASE_TABLET | Freq: Two times a day (BID) | ORAL | Status: DC

## 2017-12-20 NOTE — Progress Notes (Signed)
[facility]  Patient Status: Active (receiving services up to time of hospitalization)  [facility] is providing the following services: RN, PT, OT, MSW and HHA  If patient discharges after hours, please call [phone].   Holly Flores 12/20/2017, 5:06 PM

## 2017-12-20 NOTE — Progress Notes (Signed)
   Subjective: Ms. Mcglaughlin reported her chest pain felt better this morning; however, she was still having abdominal pain. She denied any new symptoms overnight. She could not remember when her last bowel movement was. She said her left sided weakness felt the same.   Objective:  Vital signs in last 24 hours: Vitals:   12/20/17 0300 12/20/17 0330 12/20/17 0400 12/20/17 0404  BP: (!) 152/102 (!) 164/95 (!) 142/92   Pulse: 91 93 84   Resp: 19 20 17    Temp:    98.9 F (37.2 C)  SpO2: 95% 94% 96%    Physical Exam  Constitutional: She is oriented to person, place, and time and well-developed, well-nourished, and in no distress.  Musculoskeletal: She exhibits edema.  Right UE 2+ pitting edema  Neurological: She is alert and oriented to person, place, and time.  Sensation intact bilaterally, left UE strength 3/5, left LE strength 3/5, right sided paralysis, slight right sided facial droop   Skin: Skin is warm and dry.    Assessment/Plan:  Principal Problem:   Left-sided weakness Active Problems:   Chronic diastolic CHF (congestive heart failure) (HCC)   Hypothyroidism   CKD (chronic kidney disease), stage II   PAF (paroxysmal atrial fibrillation) (HCC)   Ogilvie's syndrome   Hypokalemia  Ms. Bertucci is a 72 yo female with a PMHx of diastolic CHF, COPD, GERD, HTN, HLD, CKD stage II, PAF on eliquis since 06/19, prior stroke in 10/2017 with residual right sided paralysis presenting with chest pain, worsening weakness on the left side, and worsening right sided facial droop.   TIA vs left sided weakness  Patients left sided weakness is unchanged. She was in CIR for nearly 4 weeks and returned home after with home PT working with her ~3x a week. Most likely worsening weakness due to deconditioning secondary to her stroke in September 2019. Will follow up with PT recommendations and most likely consider stepwise approach to rehab.  - PT/OT eval and treat  Colonic Pseudobstruction    Patient initially reported last bowel movement was two days ago but could not recall this today. Will start a bowel regimen. Will continue with regular diet. - senokot bid and miralax prn  Right upper extremity swelling -Follow-up vascular ultrasound of upper extremity venous duplex  Hypokalemia K 3.4.   - potassium chloride 40 mEq bid once -Follow-up a.m. BMP   Dispo: Anticipated discharge pending PT/OT recommendations.  Mike Craze, DO 12/20/2017, 7:37 AM Pager: [phone]

## 2017-12-20 NOTE — ED Notes (Signed)
Admitting MD verified pt can take PO medications

## 2017-12-20 NOTE — ED Notes (Signed)
Meal tray ordered 

## 2017-12-20 NOTE — Progress Notes (Addendum)
STROKE TEAM PROGRESS NOTE   INTERVAL HISTORY Her daughter is at the bedside.  Pt had gradual onset worsening of L HP since d/c from rehab. Has taken eliquis daily as prescribed. Did have a HA that started on Sat night, that is much improved now. Daughter asking about additional rehab as she was only getting 2x week at home.   Vitals:   12/20/17 1115 12/20/17 1145 12/20/17 1257 12/20/17 1301  BP: (!) 171/93 (!) 156/89  (!) 162/93  Pulse: 92 96  99  Resp: 15 (!) 22  18  Temp:    98.6 F (37 C)  TempSrc:    Oral  SpO2: 96% 96%  93%  Weight:   97.5 kg   Height:   5\' 1"  (1.549 m)     CBC:  Recent Labs  Lab 12/19/17 1318  WBC 7.5  HGB 14.1  HCT 45.3  MCV 93.8  PLT 409    Basic Metabolic Panel:  Recent Labs  Lab 12/19/17 1318 12/20/17 0354  NA 138 137  K 3.4* 3.4*  CL 108 107  CO2 22 23  GLUCOSE 116* 105*  BUN 11 11  CREATININE 1.16* 1.13*  CALCIUM 9.2 9.0  MG  --  2.2   Lipid Panel:     Component Value Date/Time   CHOL 150 10/30/2017 0612   TRIG 128 10/30/2017 0612   HDL 33 (L) 10/30/2017 0612   CHOLHDL 4.5 10/30/2017 0612   VLDL 26 10/30/2017 0612   LDLCALC 91 10/30/2017 0612   HgbA1c:  Lab Results  Component Value Date   HGBA1C 6.1 (H) 10/30/2017   Urine Drug Screen: No results found for: LABOPIA, COCAINSCRNUR, LABBENZ, AMPHETMU, THCU, LABBARB  Alcohol Level     Component Value Date/Time   ETH <10 10/29/2017 0433    IMAGING Dg Chest 2 View  Result Date: 12/19/2017 CLINICAL DATA:  72 year old female with a history of mid chest pain EXAM: CHEST - 2 VIEW COMPARISON:  11/02/2017, 01/16/2015 FINDINGS: Cardiomediastinal silhouette unchanged in size and contour. Low lung volumes. Patchy opacities bilaterally, new from the prior. No pneumothorax or pleural effusion. Surgical changes of the cervical region. IMPRESSION: Patchy airspace opacities, new from the prior, concerning for multifocal pneumonia though edema cannot be excluded. Electronically Signed   By:  Corrie Mckusick D.O.   On: 12/19/2017 15:06   Ct Head Wo Contrast  Result Date: 12/19/2017 CLINICAL DATA:  72 year old female with a history chest pain EXAM: CT HEAD WITHOUT CONTRAST TECHNIQUE: Contiguous axial images were obtained from the base of the skull through the vertex without intravenous contrast. COMPARISON:  CT 10/29/2017, MR 10/29/2017 FINDINGS: Brain: No acute intracranial hemorrhage. No midline shift or mass effect. Gray-white differentiation maintained. Involving hypodensity in the posterior left basal ganglia. Unremarkable configuration of the ventricles. Vascular: Calcifications of intracranial vasculature. Skull: No acute displaced fracture.  No aggressive bony lesions. Sinuses/Orbits: No acute finding. Other: None. IMPRESSION: No acute intracranial abnormality. Evolution of known left basal ganglia infarction. Electronically Signed   By: Corrie Mckusick D.O.   On: 12/19/2017 15:12   Mr Jodene Nam Head Wo Contrast  Result Date: 12/19/2017 CLINICAL DATA:  Focal neuro deficit, suspect stroke.  Recent stroke. EXAM: MRI HEAD WITHOUT CONTRAST MRA HEAD WITHOUT CONTRAST TECHNIQUE: Multiplanar, multiecho pulse sequences of the brain and surrounding structures were obtained without intravenous contrast. Angiographic images of the head were obtained using MRA technique without contrast. COMPARISON:  CT head 12/19/2017, MRI head 10/29/2017 FINDINGS: MRI HEAD FINDINGS Brain: Resolving moderately large  infarct in the posterior limb internal capsule on the left. Moderate to extensive chronic microvascular ischemic changes in the white matter. Mild chronic ischemic change in the thalamus bilaterally. Ventricle size normal.  Mild atrophy.  No mass lesion identified. Negative for acute infarct. Chronic microhemorrhage left internal capsule unchanged from the prior study. Vascular: Normal arterial flow void Skull and upper cervical spine: Negative.  ACDF cervical spine Sinuses/Orbits: Paranasal sinuses clear. Bilateral  cataract surgery. Other: None MRA HEAD FINDINGS Both vertebral arteries patent to the basilar. Left PICA patent. Right PICA not visualized however there is a prominent right AICA which may supply this territory. Basilar widely patent. Posterior cerebral arteries patent bilaterally. Superior cerebellar arteries patent bilaterally. 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 Internal carotid artery widely patent bilaterally. Anterior and middle cerebral arteries patent bilaterally without significant stenosis or large vessel occlusion. IMPRESSION: 1. Negative for acute infarct. Recent left posterior limb internal capsule infarct shows continued evolution. Moderate to extensive chronic microvascular ischemia. 2. No significant intracranial stenosis or large vessel occlusion 3. 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 Electronically Signed   By: Franchot Gallo M.D.   On: 12/19/2017 18:19   Mr Brain Wo Contrast  Result Date: 12/19/2017 CLINICAL DATA:  Focal neuro deficit, suspect stroke.  Recent stroke. EXAM: MRI HEAD WITHOUT CONTRAST MRA HEAD WITHOUT CONTRAST TECHNIQUE: Multiplanar, multiecho pulse sequences of the brain and surrounding structures were obtained without intravenous contrast. Angiographic images of the head were obtained using MRA technique without contrast. COMPARISON:  CT head 12/19/2017, MRI head 10/29/2017 FINDINGS: MRI HEAD FINDINGS Brain: Resolving moderately large infarct in the posterior limb internal capsule on the left. Moderate to extensive chronic microvascular ischemic changes in the white matter. Mild chronic ischemic change in the thalamus bilaterally. Ventricle size normal.  Mild atrophy.  No mass lesion identified. Negative for acute infarct. Chronic microhemorrhage left internal capsule unchanged from the prior study. Vascular: Normal arterial flow void Skull  and upper cervical spine: Negative.  ACDF cervical spine Sinuses/Orbits: Paranasal sinuses clear. Bilateral cataract surgery. Other: None MRA HEAD FINDINGS Both vertebral arteries patent to the basilar. Left PICA patent. Right PICA not visualized however there is a prominent right AICA which may supply this territory. Basilar widely patent. Posterior cerebral arteries patent bilaterally. Superior cerebellar arteries patent bilaterally. 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 Internal carotid artery widely patent bilaterally. Anterior and middle cerebral arteries patent bilaterally without significant stenosis or large vessel occlusion. IMPRESSION: 1. Negative for acute infarct. Recent left posterior limb internal capsule infarct shows continued evolution. Moderate to extensive chronic microvascular ischemia. 2. No significant intracranial stenosis or large vessel occlusion 3. 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 Electronically Signed   By: Franchot Gallo M.D.   On: 12/19/2017 18:19   Dg Abd 2 Views  Result Date: 12/19/2017 CLINICAL DATA:  72 year old female with a history of chest pain EXAM: ABDOMEN - 2 VIEW COMPARISON:  11/14/2017, 11/13/2017, 11/12/2017 FINDINGS: Gaseous distention of colon, predominantly transverse colon. Gas extends through descending colon. Small amount of gas in the stomach and small bowel. Surgical clips of cholecystectomy. Surgical changes of the lumbar region. No displaced fracture. IMPRESSION: Gaseous distention of colon, potentially ileus or colonic obstruction. Electronically Signed   By: Corrie Mckusick D.O.   On: 12/19/2017 15:05    PHYSICAL EXAM  HEENT: [place]/AT Lungs: Respirations unlabored Ext: Warm and well perfused.   Neurologic Examination: Mental Status: Awake and alert. Flat affect at times. Oriented x 5. Speech dysarthric but fluent  with intact comprehension and naming. Able to follow a 2 step directional command without difficulty. Cranial Nerves: II:  Visual fields intact all 4 quadrants OS and OD. No extinction to DSS. PERRL.  III,IV, VI: No ptosis. EOMI without nystagmus.   V,VII: Prominent right facial droop. Decreased sensation on the right.  VIII: hearing intact to voice IX,X: Unable to visualize palatal margin XI: Decreased shoulder shrug on right XII: midline tongue extension  Motor: RUE: 0/5 proximal and distal RLE: 1-2/5 internal/external rotation at hip, otherwise 0/5 LUE and LLE: 5/5 Sensory: Decreased sensation R arm and leg Plantars: Right: downgoing                           Left: downgoing Cerebellar: No ataxia with FNF on the left. Unable to perform on the right.  Gait: Unable to assess  ASSESSMENT/PLAN Ms. DACI STUBBE is a 72 y.o. female with history of prior stroke, HTN, HLD, AF on eliquis, MI, OSA on CPAP, CHF, COPD and GERD presenting with increasing L side weakness and CP following L CR/BG infarct w/ flaccid R HP in Sept 2019.   CP related vs complicated Migraine with L sided weakness limiting mobility at home Recent L CR/BG infarct with R hemiparesis  CT head No acute stroke. Evolution know L BG infarct  MRI  No acute stroke. recent L PLIC infarcts. Mod to extensive Small vessel disease.   MRA  Incidental R PCA origin aneurysm  No need to repeat typical stroke workup already done in Sept  LDL 91  HgbA1c 6.1  Eliquis for VTE prophylaxis Diet Order            Diet heart healthy/carb modified Room service appropriate? Yes; Fluid consistency: Thin  Diet effective now              Eliquis (apixaban) daily prior to admission, now on Eliquis (apixaban) daily. Continue eliquis  Therapy recommendations:  pending   Disposition:  pending (home w/ family, getting HH there, f/u appt with [facility] scheduled for today)  Recommend followup with Dr/ Tomi Likens, OP neurologist  following her  Atrial Flutter  Home anticoagulation:  Eliquis (apixaban) daily  . Continue Eliquis (apixaban) daily at discharge   Hypertension  Elevated 142/92-168/102 . 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 Ileus this admission. Kept NPO  RUE swelling. RUE doppler neg DVT  Hypokalemia  Hypothyroidism  Depression/anxiety  Hospital day # 1  Burnetta Sabin, MSN, APRN, ANVP-BC, AGPCNP-BC Advanced Practice Stroke Nurse [place] for Schedule & Pager information 12/20/2017 3:57 PM   ATTENDING NOTE: I reviewed above note and agree  with the assessment and plan. Pt was seen and examined.   72 year old female with history of CHF, COPD, HTN, HLD, OSA, status post cervical and lumbar surgery, A. fib/a flutter on Eliquis presented for left-sided weakness in the setting of chest pain.  Patient had left central retinal venous occlusion in 2014, resulted with blurry vision and subsequent headache on Topamax.  In 08/2017 she was found to have a flutter on cardiac monitoring, and put on Eliquis.  In 10/2017, she had right sided weakness and slurred speech, MRI found to have left BG/CR infarct.  CT head and neck negative.  EF 60 to 60%.  She was continued on Eliquis and Lipitor.  She also follows with Dr. Tomi Likens at [facility] neurology for bilateral  CTS and right ulnar neuropathy, as well as migraine headache.  On this admission, patient had sudden onset chest pain, and the same time she felt left side weakness and loss of sensation.  The weakness and numbness resolved along with chest pain since admission.  MRI no acute infarct, MRA negative except right PCA 3 mm aneurysm.  Had upper extremity venous Doppler which was also negative.  LDL 91 and A1c 6.1.  EF 60 to 65% in 10/2017.  On exam, she is AAO x3, but still has right facial droop, right hemiplegia upper and lower extremity, right hemiparesthesia 50 to 70% of the left.  Currently patient awake alert orientated x3, still has right facial droop and right hemiplegia and right hemi-procedure due to previous left BG/CR infarct.  No focal deficit on the left side.  Patient symptoms considered as radiating deficit due to chest pain.  Physical deconditioning or complicated migraine are in differentials.  Recommend to continue Eliquis and Lipitor for stroke prevention.  Continue Topamax for migraine prevention.  She needs continue follow-up with Dr. Tomi Likens at [facility] neurology.  Neurology will sign off. Please call with questions. Pt will follow up with Dr. Tomi Likens at [facility] in about 4-6 weeks. Thanks for the consult.   Rosalin Hawking, MD PhD Stroke Neurology 12/20/2017 5:33 PM     To contact Stroke Continuity provider, please refer to [email or web address] After hours, contact General Neurology

## 2017-12-20 NOTE — ED Notes (Signed)
Nutritional services called and notified to take pt tray to new room

## 2017-12-20 NOTE — Progress Notes (Signed)
Patient trasfered from ED to [phone] via wheelchair; alert and oriented x 4; complaints of chest sore; IV saline locked in LAC; skin intact. Orient patient to room and unit; gave patient care guide; instructed how to use the call bell and  fall risk precautions. Will continue to monitor the patient.

## 2017-12-20 NOTE — ED Notes (Signed)
ED TO INPATIENT HANDOFF REPORT  Name/Age/Gender Holly Flores 72 y.o. female  Code Status    Code Status Orders  (From admission, onward)         Start     Ordered   12/19/17 1827  Full code  Continuous     12/19/17 1835        Code Status History    Date Active Date Inactive Code Status Order ID Comments User Context   11/15/2017 1730 12/08/2017 1325 Full Code [id number]  Cathlyn Parsons, PA-C Inpatient   11/15/2017 1730 11/15/2017 1730 Full Code [id number]  Cathlyn Parsons, PA-C Inpatient   11/09/2017 1522 11/15/2017 1726 Full Code [id number]  Edwin Dada, MD Inpatient   11/01/2017 1732 11/09/2017 1521 Full Code [id number]  Cathlyn Parsons, PA-C Inpatient   11/01/2017 1732 11/01/2017 1732 Full Code [id number]  Cathlyn Parsons, PA-C Inpatient   10/29/2017 0827 11/01/2017 1707 Full Code [id number]  Roxan Hockey, MD ED   12/30/2014 0139 12/31/2014 1722 Full Code [id number]  Orvan Falconer, MD Inpatient   05/24/2013 1655 05/26/2013 1436 Full Code [id number]  Marda Stalker, PA-C Inpatient      Home/SNF/Other Home  Chief Complaint CP  Level of Care/Admitting Diagnosis ED Disposition    ED Disposition Condition [place]: [facility] [100100]  Level of Care: Telemetry [5]  Diagnosis: TIA (transient ischemic attack) [867672]  Admitting Physician: Axel Filler [[id number]]  Attending Physician: Axel Filler [[id number]]  Estimated length of stay: past midnight tomorrow  Certification:: I certify this patient will need inpatient services for at least 2 midnights  PT Class (Do Not Modify): Inpatient [101]  PT Acc Code (Do Not Modify): Private [1]       Medical History Past Medical History:  Diagnosis Date  . Anxiety   . Arthritis   . CHF (congestive heart failure) ([place])   . COPD (chronic obstructive pulmonary disease) ([place])   . Depression   . GERD (gastroesophageal reflux disease)   . Goiter   .  Hyperlipidemia   . Hypertension   . Hypothyroidism   . Kidney cysts    left side  . Myocardial infarction ([place])    8 yrs ago.   . 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 Shortness of breath   . Sleep apnea    uses CPAP, 3  . Stroke ([place])   . 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    Allergies Allergies  Allergen Reactions  . Penicillins Hives, Itching and Rash    Has patient had a PCN reaction causing immediate rash, facial/tongue/throat swelling, SOB or lightheadedness with hypotension: Yes Has patient had a PCN reaction causing severe rash involving mucus membranes or skin necrosis: Yes Has patient had a PCN reaction that required hospitalization: No Has patient had a PCN reaction occurring within the last 10 years: No If all of the above answers are "NO", then may proceed with Cephalosporin use.   . Sulfa Antibiotics Swelling    Tongue swelling    IV Location/Drains/Wounds Patient Lines/Drains/Airways Status   Active Line/Drains/Airways    None          Labs/Imaging Results for orders placed or performed during the hospital encounter of 12/19/17 (from the past 48 hour(s))  Basic metabolic panel     Status: Abnormal   Collection Time: 12/19/17  1:18 PM  Result Value Ref Range   Sodium 138 135 -  145 mmol/L   Potassium 3.4 (L) 3.5 - 5.1 mmol/L   Chloride 108 98 - 111 mmol/L   CO2 22 22 - 32 mmol/L   Glucose, Bld 116 (H) 70 - 99 mg/dL   BUN 11 8 - 23 mg/dL   Creatinine, Ser 1.16 (H) 0.44 - 1.00 mg/dL   Calcium 9.2 8.9 - 10.3 mg/dL   GFR calc non Af Amer 46 (L) >60 mL/min   GFR calc Af Amer 53 (L) >60 mL/min    Comment: (NOTE) The eGFR has been calculated using the CKD EPI equation. This calculation has not been validated in all clinical situations. eGFR's persistently <60 mL/min signify possible Chronic Kidney Disease.    Anion gap 8 5 - 15    Comment: Performed at [facility] [address], [place] [address]  CBC     Status: None    Collection Time: 12/19/17  1:18 PM  Result Value Ref Range   WBC 7.5 4.0 - 10.5 K/uL   RBC 4.83 3.87 - 5.11 MIL/uL   Hemoglobin 14.1 12.0 - 15.0 g/dL   HCT 45.3 36.0 - 46.0 %   MCV 93.8 80.0 - 100.0 fL   MCH 29.2 26.0 - 34.0 pg   MCHC 31.1 30.0 - 36.0 g/dL   RDW 13.1 11.5 - 15.5 %   Platelets 296 150 - 400 K/uL   nRBC 0.0 0.0 - 0.2 %    Comment: Performed at [facility], [place] [address] [place], [place] [address]  Protime-INR     Status: Abnormal   Collection Time: 12/19/17  1:18 PM  Result Value Ref Range   Prothrombin Time 16.9 (H) 11.4 - 15.2 seconds   INR 1.39     Comment: Performed at [facility] [address], [place], [place] [address]  APTT     Status: Abnormal   Collection Time: 12/19/17  1:18 PM  Result Value Ref Range   aPTT 39 (H) 24 - 36 seconds    Comment:        IF BASELINE aPTT IS ELEVATED, SUGGEST PATIENT RISK ASSESSMENT BE USED TO DETERMINE APPROPRIATE ANTICOAGULANT THERAPY. Performed at [facility], [place] [address], [place], [place] [address]   Brain natriuretic peptide     Status: None   Collection Time: 12/19/17  1:18 PM  Result Value Ref Range   B Natriuretic Peptide 48.3 0.0 - 100.0 pg/mL    Comment: Performed at [facility] [address] [place], [place] [address]  I-stat troponin, ED     Status: None   Collection Time: 12/19/17  2:01 PM  Result Value Ref Range   Troponin i, poc 0.00 0.00 - 0.08 ng/mL   Comment 3            Comment: Due to the release kinetics of cTnI, a negative result within the first hours of the onset of symptoms does not rule out myocardial infarction with certainty. If myocardial infarction is still suspected, repeat the test at appropriate intervals.   Basic metabolic panel     Status: Abnormal   Collection Time: 12/20/17  3:54 AM  Result Value Ref Range   Sodium 137 135 - 145 mmol/L   Potassium 3.4 (L) 3.5 - 5.1 mmol/L   Chloride 107 98 - 111 mmol/L   CO2 23 22 - 32 mmol/L   Glucose,  Bld 105 (H) 70 - 99 mg/dL   BUN 11 8 - 23  mg/dL   Creatinine, Ser 1.13 (H) 0.44 - 1.00 mg/dL   Calcium 9.0 8.9 - 10.3 mg/dL   GFR calc non Af Amer 47 (L) >60 mL/min   GFR calc Af Amer 55 (L) >60 mL/min    Comment: (NOTE) The eGFR has been calculated using the CKD EPI equation. This calculation has not been validated in all clinical situations. eGFR's persistently <60 mL/min signify possible Chronic Kidney Disease.    Anion gap 7 5 - 15    Comment: Performed at [facility] [address] [place], [place] [address]  Magnesium     Status: None   Collection Time: 12/20/17  3:54 AM  Result Value Ref Range   Magnesium 2.2 1.7 - 2.4 mg/dL    Comment: Performed at [facility] [address] [place], [place] [address]   Dg Chest 2 View  Result Date: 12/19/2017 CLINICAL DATA:  72 year old female with a history of mid chest pain EXAM: CHEST - 2 VIEW COMPARISON:  11/02/2017, 01/16/2015 FINDINGS: Cardiomediastinal silhouette unchanged in size and contour. Low lung volumes. Patchy opacities bilaterally, new from the prior. No pneumothorax or pleural effusion. Surgical changes of the cervical region. IMPRESSION: Patchy airspace opacities, new from the prior, concerning for multifocal pneumonia though edema cannot be excluded. Electronically Signed   By: Corrie Mckusick D.O.   On: 12/19/2017 15:06   Ct Head Wo Contrast  Result Date: 12/19/2017 CLINICAL DATA:  72 year old female with a history chest pain EXAM: CT HEAD WITHOUT CONTRAST TECHNIQUE: Contiguous axial images were obtained from the base of the skull through the vertex without intravenous contrast. COMPARISON:  CT 10/29/2017, MR 10/29/2017 FINDINGS: Brain: No acute intracranial hemorrhage. No midline shift or mass effect. Gray-white differentiation maintained. Involving hypodensity in the posterior left basal ganglia. Unremarkable configuration of the ventricles. Vascular: Calcifications of intracranial vasculature. Skull: No acute  displaced fracture.  No aggressive bony lesions. Sinuses/Orbits: No acute finding. Other: None. IMPRESSION: No acute intracranial abnormality. Evolution of known left basal ganglia infarction. Electronically Signed   By: Corrie Mckusick D.O.   On: 12/19/2017 15:12   Mr Jodene Nam Head Wo Contrast  Result Date: 12/19/2017 CLINICAL DATA:  Focal neuro deficit, suspect stroke.  Recent stroke. EXAM: MRI HEAD WITHOUT CONTRAST MRA HEAD WITHOUT CONTRAST TECHNIQUE: Multiplanar, multiecho pulse sequences of the brain and surrounding structures were obtained without intravenous contrast. Angiographic images of the head were obtained using MRA technique without contrast. COMPARISON:  CT head 12/19/2017, MRI head 10/29/2017 FINDINGS: MRI HEAD FINDINGS Brain: Resolving moderately large infarct in the posterior limb internal capsule on the left. Moderate to extensive chronic microvascular ischemic changes in the white matter. Mild chronic ischemic change in the thalamus bilaterally. Ventricle size normal.  Mild atrophy.  No mass lesion identified. Negative for acute infarct. Chronic microhemorrhage left internal capsule unchanged from the prior study. Vascular: Normal arterial flow void Skull and upper cervical spine: Negative.  ACDF cervical spine Sinuses/Orbits: Paranasal sinuses clear. Bilateral cataract surgery. Other: None MRA HEAD FINDINGS Both vertebral arteries patent to the basilar. Left PICA patent. Right PICA not visualized however there is a prominent right AICA which may supply this territory. Basilar widely patent. Posterior cerebral arteries patent bilaterally. Superior cerebellar arteries patent bilaterally. 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 Internal carotid artery widely patent bilaterally. Anterior and middle cerebral arteries patent bilaterally without significant stenosis or large vessel occlusion.  IMPRESSION: 1. Negative  for acute infarct. Recent left posterior limb internal capsule infarct shows continued evolution. Moderate to extensive chronic microvascular ischemia. 2. No significant intracranial stenosis or large vessel occlusion 3. 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 Electronically Signed   By: Franchot Gallo M.D.   On: 12/19/2017 18:19   Mr Brain Wo Contrast  Result Date: 12/19/2017 CLINICAL DATA:  Focal neuro deficit, suspect stroke.  Recent stroke. EXAM: MRI HEAD WITHOUT CONTRAST MRA HEAD WITHOUT CONTRAST TECHNIQUE: Multiplanar, multiecho pulse sequences of the brain and surrounding structures were obtained without intravenous contrast. Angiographic images of the head were obtained using MRA technique without contrast. COMPARISON:  CT head 12/19/2017, MRI head 10/29/2017 FINDINGS: MRI HEAD FINDINGS Brain: Resolving moderately large infarct in the posterior limb internal capsule on the left. Moderate to extensive chronic microvascular ischemic changes in the white matter. Mild chronic ischemic change in the thalamus bilaterally. Ventricle size normal.  Mild atrophy.  No mass lesion identified. Negative for acute infarct. Chronic microhemorrhage left internal capsule unchanged from the prior study. Vascular: Normal arterial flow void Skull and upper cervical spine: Negative.  ACDF cervical spine Sinuses/Orbits: Paranasal sinuses clear. Bilateral cataract surgery. Other: None MRA HEAD FINDINGS Both vertebral arteries patent to the basilar. Left PICA patent. Right PICA not visualized however there is a prominent right AICA which may supply this territory. Basilar widely patent. Posterior cerebral arteries patent bilaterally. Superior cerebellar arteries patent bilaterally. 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 Internal carotid artery widely patent bilaterally.  Anterior and middle cerebral arteries patent bilaterally without significant stenosis or large vessel occlusion. IMPRESSION: 1. Negative for acute infarct. Recent left posterior limb internal capsule infarct shows continued evolution. Moderate to extensive chronic microvascular ischemia. 2. No significant intracranial stenosis or large vessel occlusion 3. 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 Electronically Signed   By: Franchot Gallo M.D.   On: 12/19/2017 18:19   Dg Abd 2 Views  Result Date: 12/19/2017 CLINICAL DATA:  73 year old female with a history of chest pain EXAM: ABDOMEN - 2 VIEW COMPARISON:  11/14/2017, 11/13/2017, 11/12/2017 FINDINGS: Gaseous distention of colon, predominantly transverse colon. Gas extends through descending colon. Small amount of gas in the stomach and small bowel. Surgical clips of cholecystectomy. Surgical changes of the lumbar region. No displaced fracture. IMPRESSION: Gaseous distention of colon, potentially ileus or colonic obstruction. Electronically Signed   By: Corrie Mckusick D.O.   On: 12/19/2017 15:05    Pending Labs Unresulted Labs (From admission, onward)   None      Vitals/Pain Today's Vitals   12/20/17 0900 12/20/17 1000 12/20/17 1115 12/20/17 1145  BP: (!) 164/93 (!) 172/90 (!) 171/93 (!) 156/89  Pulse: 95 94 92 96  Resp: _0 (!) 22  Temp:      SpO2: 95% 94% 96% 96%  PainSc:        Isolation Precautions No active isolations  Medications Medications  metoprolol succinate (TOPROL-XL) 24 hr tablet 50 mg (0 mg Oral Hold 12/20/17 1043)  ALPRAZolam (XANAX) tablet 0.5 mg (0.5 mg Oral Given 12/20/17 0510)  DULoxetine (CYMBALTA) DR capsule 30 mg (30 mg Oral Not Given 12/20/17 1043)  levothyroxine (SYNTHROID, LEVOTHROID) tablet 50 mcg (0 mcg Oral Hold 12/20/17 1042)  pantoprazole (PROTONIX) EC tablet 40 mg (40 mg Oral Not Given 12/20/17 1043)  apixaban (ELIQUIS) tablet 5 mg (5 mg Oral Not Given 12/20/17 1043)  topiramate  (TOPAMAX)  tablet 25 mg (25 mg Oral Not Given 12/20/17 1043)  sodium chloride flush (NS) 0.9 % injection 3 mL (3 mLs Intravenous Not Given 12/20/17 1044)  potassium chloride SA (K-DUR,KLOR-CON) CR tablet 40 mEq (40 mEq Oral Not Given 12/20/17 1044)  acetaminophen (TYLENOL) tablet 650 mg (has no administration in time range)    Or  acetaminophen (TYLENOL) suppository 650 mg (has no administration in time range)  polyethylene glycol (MIRALAX / GLYCOLAX) packet 17 g (has no administration in time range)  senna-docusate (Senokot-S) tablet 1 tablet (has no administration in time range)  atorvastatin (LIPITOR) tablet 80 mg (has no administration in time range)  lisinopril (PRINIVIL,ZESTRIL) tablet 10 mg (has no administration in time range)    Mobility walks with device

## 2017-12-20 NOTE — ED Notes (Signed)
Repositioned   b regular hospital bed requested approx 2 hours ago   Requested again    We need that bed

## 2017-12-20 NOTE — Progress Notes (Signed)
Internal Medicine Attending:   I saw and examined the patient. I reviewed the resident's note and I agree with the resident's findings and plan as documented in the resident's note.  Principal Problem:   Left-sided weakness Active Problems:   Slow transit constipation   PAF (paroxysmal atrial fibrillation) (HCC)   Hemiparesis affecting right side as late effect of stroke [facility])   Hospital day #2 with decreased functional status due to right hemiparesis as an affect of the acute stroke she suffered in September.  Unfortunately without more consistent rehab at home her left side has weekend and she is now struggling to perform ADLs or simple transfers.  Another acute stroke has been ruled out with a repeat MRI.  Other medical conditions seem to be at baseline. We will need to re-establish a better bowel regimen for her chronic constipation to prevent another pseudobstruction. Disposition is pending PT and OT evaluations.  Lalla Brothers, MD

## 2017-12-20 NOTE — Progress Notes (Signed)
Right upper extremity venous duplex completed. Preliminary results - There is no evidence of DVT or supetficial thrombosis. Holly Flores,RVS 12/20/2017, 8:41 AM

## 2017-12-20 NOTE — ED Notes (Signed)
Pt moved from stretcher onto a regular hospital bed.  Pure-wick applied.  Pt not sleeping very much

## 2017-12-21 DIAGNOSIS — Z79899 Other long term (current) drug therapy: Secondary | ICD-10-CM

## 2017-12-21 DIAGNOSIS — R5381 Other malaise: Secondary | ICD-10-CM

## 2017-12-21 LAB — BASIC METABOLIC PANEL
ANION GAP: 10 (ref 5–15)
BUN: 11 mg/dL (ref 8–23)
CHLORIDE: 106 mmol/L (ref 98–111)
CO2: 22 mmol/L (ref 22–32)
Calcium: 9.4 mg/dL (ref 8.9–10.3)
Creatinine, Ser: 1.1 mg/dL — ABNORMAL HIGH (ref 0.44–1.00)
GFR calc non Af Amer: 49 mL/min — ABNORMAL LOW (ref >60)
GFR, EST AFRICAN AMERICAN: 57 mL/min — AB (ref >60)
Glucose, Bld: 106 mg/dL — ABNORMAL HIGH (ref 70–99)
POTASSIUM: 3.1 mmol/L — AB (ref 3.5–5.1)
Sodium: 138 mmol/L (ref 135–145)

## 2017-12-21 MED ORDER — POTASSIUM CHLORIDE CRYS ER 20 MEQ PO TBCR
40.0000 meq | EXTENDED_RELEASE_TABLET | Freq: Two times a day (BID) | ORAL | Status: AC
  Administered 2017-12-21: 40 meq via ORAL
  Filled 2017-12-21: qty 2

## 2017-12-21 MED ORDER — SENNOSIDES-DOCUSATE SODIUM 8.6-50 MG PO TABS
2.0000 | ORAL_TABLET | Freq: Two times a day (BID) | ORAL | Status: DC
  Administered 2017-12-21 (×2): 2 via ORAL
  Filled 2017-12-21 (×3): qty 2

## 2017-12-21 NOTE — Progress Notes (Signed)
CSW notified by nursing secretary that patient's family requested CSW speak to them in the room. CSW responded that CSW would come to the room as soon as able, as CSW had discharges to complete. CSW also notified by RN that patient's family wanted to speak with CSW. CSW provided same response. CSW was at the desk on the phone  completing a patient discharge when the family came up to the desk again and started talking with CSW after CSW hung up the phone. RNCM also spoke with them. They requested CIR for the patient. CSW and RNCM let them (daughter and son) know that patient had not been recommended for CIR so we would contact the MD for an order. They left the desk and went back to the room. Minutes later, another family member (Granddaughter) came to the desk demanding CSW come to the room as she also wanted to hear the information and that they had been waiting on the CSW for 20 minutes. CSW explained that there were time sensitive discharges that needed to be completed and that CSW would come to the room immediately after.   CSW went to the room as the MD was going in. MD spoke with patient and family and then CSW spoke with them as well. CIR will screen the patient and if they are unable to accept her, patient's family is in agreement with SNF placement. CSW alerted them that patient had selected [facility]. Patient's family stated they wanted to review the facility choices and let CSW know in the morning their decision. CSW provided contact information and stressed the importance of choosing quickly as bed availability changes daily. CSW to check back in tomorrow.   Percell Locus Jaquel Glassburn LCSW [phone]

## 2017-12-21 NOTE — Care Management Note (Signed)
Case Management Note  Patient Details  Name: GLYNNIS GAVEL MRN: [id number] Date of Birth: [birth year]  Subjective/Objective:           Presents with Chest pain, new onset left sided weakness. Hx of  diastolic CHF, COPD, GERD, HTN, HLD, CKD stage II, atrial flutter on eliquis since 7/19, prior stroke in 10/2017 with residual right sided paralysis. From home with husband, Louis. [facility] provided home health services,  RN, PT, OT, MSW and HHA.  Nadeen Landau (Spouse) Jolean Madariaga ([phone]    Action/Plan: TOC needs in process... Per PT's recommendation : SNF.Pt agreeable to SNF placement... CSW made aware per NCM.   Expected Discharge Date:                  Expected Discharge Plan:  Skilled Nursing Facility(Resides with husband, Luiz Ochoa)  In-House Referral:  Clinical Social Work  Discharge planning Services  CM Consult  Post Acute Care Choice:    Choice offered to:  NA  DME Arranged:  N/A(owns cane, walker , w/c, showerchair) DME Agency:  NA  HH Arranged:  NA HH Agency:  NA  Status of Service:  Completed, signed off  If discussed at [place] of Stay Meetings, dates discussed:    Additional Comments:  Sharin Mons, RN 12/21/2017, 11:29 AM

## 2017-12-21 NOTE — NC FL2 (Signed)
[place] MEDICAID FL2 LEVEL OF CARE SCREENING TOOL     IDENTIFICATION  Patient Name: Holly Flores Birthdate: 06/30/1945 Sex: female Admission Date (Current Location): 12/19/2017  [address] [facility] and [place] Number:  [employer] and Address:  [facility], [place] [address] [place], [place] [address]      Provider Number: [id number]  Attending Physician Name and Address:  Axel Filler, *  Relative Name and Phone Number:  Luiz Ochoa (spouse) [phone]    Current Level of Care: Hospital Recommended Level of Care: [place] Prior Approval Number:    Date Approved/Denied:   PASRR Number:    Discharge Plan: SNF    Current Diagnoses: Patient Active Problem List   Diagnosis Date Noted  . Physical deconditioning 12/19/2017  . Chronic kidney disease (CKD), stage III (moderate) (HCC)   . Hypoalbuminemia due to protein-calorie malnutrition ([place])   . Intractable migraine without status migrainosus   . Reactive depression   . Hemiparesis affecting right side as late effect of stroke ([place])   . Slow transit constipation   . PAF (paroxysmal atrial fibrillation) ([place])   . Dysphasia, post-stroke   . COPD (chronic obstructive pulmonary disease) ([place]) 10/29/2017  . Chronic diastolic CHF (congestive heart failure) ([place]) 10/29/2017  . Hypothyroidism 10/29/2017  . Essential hypertension 10/29/2017  . GERD (gastroesophageal reflux disease) 10/29/2017  . Anxiety 10/29/2017  . 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 Hyperlipidemia 04/20/2013    Orientation RESPIRATION BLADDER Height & Weight     Self, Time, Situation, Place  Normal Continent Weight: 214 lb 15.2 oz (97.5 kg) Height:  5\' 1"  (154.9 cm)  BEHAVIORAL SYMPTOMS/MOOD NEUROLOGICAL BOWEL NUTRITION STATUS      Continent Diet(see discharge summary)  AMBULATORY STATUS COMMUNICATION OF NEEDS Skin   Limited Assist Verbally Normal                        Personal Care Assistance Level of Assistance  Bathing, Feeding, Dressing, Total care Bathing Assistance: Limited assistance Feeding assistance: Independent Dressing Assistance: Limited assistance Total Care Assistance: Limited assistance   Functional Limitations Info  Sight, Hearing, Speech Sight Info: Impaired Hearing Info: Adequate Speech Info: Adequate    SPECIAL CARE FACTORS FREQUENCY  PT (By licensed PT), OT (By licensed OT)     PT Frequency: min 3x weekly OT Frequency: min 2x weekly            Contractures Contractures Info: Not present    Additional Factors Info  Allergies   Allergies Info: Allergies:  Penicillins, Sulfa Antibiotics           Current Medications (12/21/2017):  This is the current hospital active medication list Current Facility-Administered Medications  Medication Dose Route Frequency Provider Last Rate Last Dose  . acetaminophen (TYLENOL) tablet 650 mg  650 mg Oral Q6H PRN Santos-Sanchez, Merlene Morse, MD       Or  . acetaminophen (TYLENOL) suppository 650 mg  650 mg Rectal Q6H PRN Santos-Sanchez, Merlene Morse, MD      . ALPRAZolam Duanne Moron) tablet 0.5 mg  0.5 mg Oral QHS Santos-Sanchez, Idalys, MD   0.5 mg at 12/20/17 2217  . apixaban (ELIQUIS) tablet 5 mg  5 mg Oral BID Welford Roche, MD   5 mg at 12/21/17 0926  . atorvastatin (LIPITOR) tablet 80 mg  80 mg Oral q1800 Velna Ochs, MD   80 mg at 12/20/17 1721  . DULoxetine (CYMBALTA) DR capsule 30 mg  30 mg Oral Daily Welford Roche, MD   30 mg at 12/21/17 0926  . levothyroxine (SYNTHROID, LEVOTHROID) tablet 50 mcg  50 mcg Oral Q24H Welford Roche, MD   50 mcg at 12/21/17 0619  . lisinopril (PRINIVIL,ZESTRIL) tablet 10 mg  10 mg Oral Daily Rehman, Areeg N, DO   10 mg at 12/21/17 0926  . metoprolol succinate (TOPROL-XL) 24 hr tablet 50 mg  50 mg Oral Daily Welford Roche, MD   50 mg at 12/21/17 0926  . pantoprazole (PROTONIX) EC tablet 40 mg  40 mg Oral Daily  Welford Roche, MD   40 mg at 12/21/17 0926  . polyethylene glycol (MIRALAX / GLYCOLAX) packet 17 g  17 g Oral Daily PRN Rehman, Areeg N, DO      . senna-docusate (Senokot-S) tablet 2 tablet  2 tablet Oral BID Welford Roche, MD   2 tablet at 12/21/17 0926  . sodium chloride flush (NS) 0.9 % injection 3 mL  3 mL Intravenous Q12H Welford Roche, MD   3 mL at 12/21/17 0929  . topiramate (TOPAMAX) tablet 25 mg  25 mg Oral BID Welford Roche, MD   25 mg at 12/21/17 3578     Discharge Medications: Please see discharge summary for a list of discharge medications.  Relevant Imaging Results:  Relevant Lab Results:   Additional Information SSN: [id number]  Alberteen Sam, LCSW

## 2017-12-21 NOTE — Evaluation (Signed)
Physical Therapy Evaluation Patient Details Name: Holly Flores MRN: [id number] DOB: 1945/10/08 Today's Date: 12/21/2017   History of Present Illness  Holly Flores is a 72yo female who comes to [facility] from home after progressive weakness. 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 PMH: PAF, HTN, hypoTSH. PTA pt was using wC for household mobiity, and [name] for SPT hospital bed to/from [facility] c hemiwalker.   Clinical Impression  Pt admitted with above diagnosis. Pt currently with functional limitations due to the deficits listed below (see "PT Problem List"). Upon entry, pt in bed, no family/caregiver present. The pt is awake and agreeable to participate.   The pt is alert and oriented x3, pleasant, conversational, and following simple commands consistently. Pt has mild intermittent word finding difficulty in session >3x, reports as similar to recent baseline after CVA. Functional mobility assessment demonstrates increased effort/time requirements, poor tolerance, and need for physical assistance, whereas the patient performed these at a higher level of independence PTA. Pt needs more physical assist with standing stability and pivot compared to PTA, was much more independent after leaving CIR and returning home. I agree with attending that a higher frequency of rehab services would be favorable to long term outcomes in ADL/IADL and mobility, as patient is still in subacute to early chronic phase of her CVA recovery, and STR would be the best setting for high frequency services, If STR is not available per insurance coverage, OPPT services would still be favorable to HHPT if transportation is not a limiting factor. Pt will benefit from skilled PT intervention to increase independence and safety with basic mobility in preparation for discharge to the venue listed below.       Follow Up Recommendations SNF    Equipment Recommendations  None recommended by PT     Recommendations for Other Services       Precautions / Restrictions Precautions Precautions: Fall Precaution Comments: rt hemiparesis with intact sensation, ititally some aphasia c pt which has since resolved.  Restrictions Weight Bearing Restrictions: No      Mobility  Bed Mobility Overal bed mobility: Needs Assistance Bed Mobility: Supine to Sit     Supine to sit: Max assist     General bed mobility comments: maxA for trunk, RLE progression, and protection of RUE  Transfers Overall transfer level: Needs assistance Equipment used: (using bed rail, then arm chair to simulate hemiwaler use ) Transfers: Stand Pivot Transfers;Sit to/from Stand Sit to Stand: Mod assist;From elevated surface Stand pivot transfers: Max assist          Ambulation/Gait                Stairs            Wheelchair Mobility    Modified Rankin (Stroke Patients Only)       Balance                                             Pertinent Vitals/Pain Pain Assessment: No/denies pain    Home Living Family/patient expects to be discharged to:: Private residence Living Arrangements: Spouse/significant other;Other relatives(granddaughter (30s) ) Available Help at Discharge: Family Type of Home: House Home Access: Ramped entrance   Entrance Stairs-Number of Steps: 2-3 steps to enter house with rails, 2 steps down to den but pt reports she does not  have to go down there, pt states husband can install L rail at home entry Home Layout: One level Home Equipment: Cane - single point;Walker - 2 wheels;Shower seat;Bedside commode;Wheelchair - manual;Hospital bed;Other (comment)(hemiwalker) Additional Comments: pt owns [place], w/c, RW, cane    Prior Function Level of Independence: Independent;Independent with assistive device(s)   Gait / Transfers Assistance Needed: mod to max A for ADL, mod-max A for stand pivot transfers with hemiwalker            Hand Dominance    Dominant Hand: Right    Extremity/Trunk Assessment   Upper Extremity Assessment Upper Extremity Assessment: RUE deficits/detail RUE Deficits / Details: sensation intact, no tract activation noted when screening and cued verbally     Lower Extremity Assessment RLE Deficits / Details: sensation intact, no tract activation noted when screening and cued verbally     Cervical / Trunk Assessment Cervical / Trunk Assessment: (limited upright sitting balance )  Communication      Cognition Arousal/Alertness: Awake/alert Behavior During Therapy: WFL for tasks assessed/performed Overall Cognitive Status: Within Functional Limits for tasks assessed                                        General Comments      Exercises     Assessment/Plan    PT Assessment Patient needs continued PT services  PT Problem List Decreased strength;Decreased activity tolerance;Decreased balance;Decreased mobility;Decreased knowledge of use of DME;Decreased safety awareness       PT Treatment Interventions Gait training;Functional mobility training;Therapeutic activities;Therapeutic exercise;Patient/family education;Neuromuscular re-education;DME instruction;Balance training;Wheelchair mobility training    PT Goals (Current goals can be found in the Care Plan section)  Acute Rehab PT Goals Patient Stated Goal: regain strength; progress independence at STR to improve self efficacy uponm return to home PT Goal Formulation: With patient Time For Goal Achievement: 12/28/17 Potential to Achieve Goals: Fair    Frequency Min 3X/week   Barriers to discharge        Co-evaluation               AM-PAC PT "6 Clicks" Daily Activity  Outcome Measure Difficulty turning over in bed (including adjusting bedclothes, sheets and blankets)?: Unable Difficulty moving from lying on back to sitting on the side of the bed? : Unable Difficulty sitting down on and standing up from a chair with  arms (e.g., wheelchair, bedside commode, etc,.)?: Unable Help needed moving to and from a bed to chair (including a wheelchair)?: Total Help needed walking in hospital room?: Total Help needed climbing 3-5 steps with a railing? : Total 6 Click Score: 6    End of Session Equipment Utilized During Treatment: Gait belt Activity Tolerance: Patient tolerated treatment well Patient left: in chair;with call bell/phone within reach;with chair alarm set Nurse Communication: Mobility status PT Visit Diagnosis: Other abnormalities of gait and mobility (R26.89);Unsteadiness on feet (R26.81);Muscle weakness (generalized) (M62.81)    Time: 1027-2536 PT Time Calculation (min) (ACUTE ONLY): 25 min   Charges:   PT Evaluation $PT Eval Moderate Complexity: 1 Mod          10:42 AM, 12/21/17 Etta Grandchild, PT, DPT Physical Therapist - [place] [phone] (Pager)  [phone] (Office)     Stanly Si C 12/21/2017, 10:39 AM

## 2017-12-21 NOTE — Progress Notes (Signed)
Family in the patient's room requested to speak with the social worker and with the PT who worked with patient this AM. Social worker was notified. Writer called PT phone but he didn't answer and he didn't called back. Writer asked the family if they want to speak with the patient's doctor, but they said "no, we want to talk with PT". Writer tried to call back PT with no success.

## 2017-12-21 NOTE — Progress Notes (Signed)
   Subjective: [name] reported feeling better this morning.  She felt that her left-sided weakness had improved some.  Still feels that her abdomen is distended.  She reported she had a bowel movement today.  She also said her chest pain has improved however she did have some minor chest pain overnight.  Objective:  Vital signs in last 24 hours: Vitals:   12/20/17 1301 12/20/17 2134 12/21/17 0528 12/21/17 1331  BP: (!) 162/93 (!) 148/98 (!) 158/95 (!) 172/110  Pulse: 99 (!) 107 92 82  Resp: 18 19 19 16   Temp: 98.6 F (37 C) 98.9 F (37.2 C) 98.3 F (36.8 C) (!) 97.5 F (36.4 C)  TempSrc: Oral Oral Oral Oral  SpO2: 93% 96% 96% 95%  Weight:      Height:       Physical Exam  Constitutional: She is oriented to person, place, and time and well-developed, well-nourished, and in no distress.  Musculoskeletal: She exhibits no edema.  Neurological: She is alert and oriented to person, place, and time.  Right sided paralysis, left side UE and LE strength 3/5, sensation in tact bilaterally   Skin: Skin is warm and dry.    Assessment/Plan:  Principal Problem:   Left-sided weakness Active Problems:   Slow transit constipation   PAF (paroxysmal atrial fibrillation) (HCC)   Hemiparesis affecting right side as late effect of stroke ([place])  Ms. Schnyder is a 72 yo female with a PMHx of diastolic CHF, COPD, GERD, HTN, HLD, CKD stage II, PAF on eliquis since 06/19, prior stroke in 10/2017 with residual right sided paralysis presenting with chest pain, worsening weakness on the left side, and worsening right sided facial droop.   Left sided weakness Most likely due to decrease in rehab after she suffered an acute stroke in September. PT/OT recommending SNF. Patient is medically stable for discharge pending SNF placement. - consulted SW   Colonic Pseudobstruction  Patient stated she had a bowel movement today. - continue senokot bid and miralax prn  HTN Patient on metoprolol 50 mg  qd. BP has been elevated during this hospital course. Started lisinopril 10 mg qd.   Right upper extremity swelling - vascular ultrasound of upper extremity venous duplex negative - swelling most likely due to disuse of hand 2/2 right sided paralysis from prior stroke  Hypokalemia K 3.1, repleted orally  - potassium chloride 40 mEq bid once -f/u bmp   Dispo: Anticipated discharge pending SNF placement.  Halea Lieb N, DO 12/21/2017, 1:50 PM Pager: [phone]

## 2017-12-21 NOTE — Clinical Social Work Note (Addendum)
Clinical Social Work Assessment  Patient Details  Name: Holly Flores MRN: [id number] Date of Birth: [birth year]  Date of referral:  12/21/17               Reason for consult:  Facility Placement                Permission sought to share information with:  Facility Sport and exercise psychologist, Family Supports Permission granted to share information::  Yes, Verbal Permission Granted  Name::        Agency::  SNFs  Relationship::     Contact Information:     Housing/Transportation Living arrangements for the past 2 months:  Single Family Home Source of Information:  Patient Patient Interpreter Needed:  None Criminal Activity/Legal Involvement Pertinent to Current Situation/Hospitalization:  No - Comment as needed Significant Relationships:  Adult Children Lives with:  Self Do you feel safe going back to the place where you live?  No Need for family participation in patient care:  No (Coment)  Care giving concerns:  CSW received consult for possible SNF placement at time of discharge. CSW spoke with patient regarding PT recommendation of SNF placement at time of discharge. Patient reported that patient's family is currently unable to care for her at their home given patient's current physical needs and fall risk. Patient expressed understanding of PT recommendation and is agreeable to SNF placement at time of discharge. CSW to continue to follow and assist with discharge planning needs.   Social Worker assessment / plan:  CSW spoke with patient concerning possibility of rehab at [facility] before returning home.  Employment status:  Retired Forensic scientist:  Medicare PT Recommendations:  [place] / Referral to community resources:  [place]  Patient/Family's Response to care:  Patient recognizes need for rehab before returning home and is agreeable to a SNF in [place]. CSW provided SNF choices in that area. Patient reported preference  for [facility].  Patient/Family's Understanding of and Emotional Response to Diagnosis, Current Treatment, and Prognosis:  Patient/family is realistic regarding therapy needs and expressed being hopeful for SNF placement. Patient expressed understanding of CSW role and discharge process as well as medical condition. No questions/concerns about plan or treatment.    Emotional Assessment Appearance:  Appears stated age Attitude/Demeanor/Rapport:  Gracious, Engaged Affect (typically observed):  Accepting, Appropriate Orientation:  Oriented to Self, Oriented to Place, Oriented to  Time, Oriented to Situation Alcohol / Substance use:  Not Applicable Psych involvement (Current and /or in the community):  No (Comment)  Discharge Needs  Concerns to be addressed:  Care Coordination Readmission within the last 30 days:  No Current discharge risk:  Dependent with Mobility Barriers to Discharge:  Continued Medical Work up   [employer], LCSW 12/21/2017, 5:41 PM

## 2017-12-21 NOTE — Progress Notes (Signed)
Rehab Admissions Coordinator Note:  Per CM Angela's recommendation, Patient was screened by Jhonnie Garner for appropriateness for an Inpatient Acute Rehab Consult.  At this time, we are recommending [place]; it appears that the pt will need a longer post acute rehab as she declined quickly once [place] home from prior CIR stay.   Please call if questions.   Jhonnie Garner 12/21/2017, 5:59 PM  I can be reached at [phone].

## 2017-12-21 NOTE — Progress Notes (Signed)
Internal Medicine Attending:   I saw and examined the patient. I reviewed the resident's note and I agree with the resident's findings and plan as documented in the resident's note.  Principal Problem:   Physical deconditioning Active Problems:   Slow transit constipation   PAF (paroxysmal atrial fibrillation) (HCC)   Hemiparesis affecting right side as late effect of stroke (HCC)   Stable condition today. Working on disposition to SNF for more PT to improve functional status from the stroke she suffered in September.   Lalla Brothers, MD

## 2017-12-22 DIAGNOSIS — Z880 Allergy status to penicillin: Secondary | ICD-10-CM

## 2017-12-22 DIAGNOSIS — Z882 Allergy status to sulfonamides status: Secondary | ICD-10-CM

## 2017-12-22 DIAGNOSIS — R140 Abdominal distension (gaseous): Secondary | ICD-10-CM

## 2017-12-22 LAB — BASIC METABOLIC PANEL
ANION GAP: 8 (ref 5–15)
BUN: 14 mg/dL (ref 8–23)
CHLORIDE: 108 mmol/L (ref 98–111)
CO2: 22 mmol/L (ref 22–32)
Calcium: 9.6 mg/dL (ref 8.9–10.3)
Creatinine, Ser: 1.1 mg/dL — ABNORMAL HIGH (ref 0.44–1.00)
GFR calc non Af Amer: 49 mL/min — ABNORMAL LOW (ref >60)
GFR, EST AFRICAN AMERICAN: 57 mL/min — AB (ref >60)
Glucose, Bld: 117 mg/dL — ABNORMAL HIGH (ref 70–99)
POTASSIUM: 2.9 mmol/L — AB (ref 3.5–5.1)
Sodium: 138 mmol/L (ref 135–145)

## 2017-12-22 MED ORDER — LISINOPRIL 10 MG PO TABS: 10.0000 mg | ORAL_TABLET | Freq: Every day | ORAL | 0 refills | Status: DC

## 2017-12-22 MED ORDER — POTASSIUM CHLORIDE CRYS ER 20 MEQ PO TBCR
40.0000 meq | EXTENDED_RELEASE_TABLET | Freq: Two times a day (BID) | ORAL | Status: DC
  Administered 2017-12-22: 40 meq via ORAL
  Filled 2017-12-22: qty 2

## 2017-12-22 MED ORDER — BISACODYL 10 MG RE SUPP
10.0000 mg | Freq: Once | RECTAL | Status: AC
  Administered 2017-12-22: 10 mg via RECTAL

## 2017-12-22 MED ORDER — POLYETHYLENE GLYCOL 3350 17 G PO PACK: 17.0000 g | PACK | Freq: Every day | ORAL | 0 refills | Status: DC

## 2017-12-22 MED ORDER — SENNOSIDES-DOCUSATE SODIUM 8.6-50 MG PO TABS: 2.0000 | ORAL_TABLET | Freq: Two times a day (BID) | ORAL | 0 refills | Status: AC

## 2017-12-22 MED ORDER — POLYETHYLENE GLYCOL 3350 17 G PO PACK: 17.0000 g | PACK | Freq: Every day | ORAL | Status: DC

## 2017-12-22 MED ORDER — ONDANSETRON HCL 4 MG/2ML IJ SOLN
4.0000 mg | Freq: Once | INTRAMUSCULAR | Status: AC
  Administered 2017-12-22: 4 mg via INTRAVENOUS
  Filled 2017-12-22: qty 2

## 2017-12-22 MED ORDER — ALPRAZOLAM 0.5 MG PO TABS: 0.5000 mg | ORAL_TABLET | Freq: Every day | ORAL | 0 refills | Status: DC

## 2017-12-22 NOTE — Progress Notes (Signed)
CSW updated patient's family that patient does not qualify for CIR. They have selected [facility]. Patient able to discharge there today.   Percell Locus Rajeev Escue LCSW [phone]

## 2017-12-22 NOTE — Progress Notes (Signed)
   Subjective: Holly Flores reported feeling nauseated, vomiting and having abdominal pain. She denied any bowel movements today but reported she had one yesterday. She felt her weakness had improved.   Objective:  Vital signs in last 24 hours: Vitals:   12/21/17 0528 12/21/17 1331 12/21/17 2131 12/22/17 0409  BP: (!) 158/95 (!) 172/110 (!) 179/100 (!) 152/94  Pulse: 92 82 82 77  Resp: 19 16 19    Temp: 98.3 F (36.8 C) (!) 97.5 F (36.4 C) 98.6 F (37 C) 98.6 F (37 C)  TempSrc: Oral Oral    SpO2: 96% 95% 97% 91%  Weight:      Height:       Physical Exam  Constitutional: She is well-developed, well-nourished, and in no distress.  Abdominal: She exhibits distension. There is tenderness.  Tympany on percussion     Assessment/Plan:  Principal Problem:   Physical deconditioning Active Problems:   Slow transit constipation   PAF (paroxysmal atrial fibrillation) (HCC)   Hemiparesis affecting right side as late effect of stroke ([place])  Holly Flores is a 72 yo female with a PMHx of diastolic CHF, COPD, GERD, HTN, HLD, CKD stage II, PAF on eliquis since 06/19, prior stroke in 10/2017 with residual right sided paralysis presenting with chest pain, worsening weakness on the left side, and worsening right sided facial droop.   Left sided weakness PT/OT recommending SNF. CIR screened patient and also recommended SNF. Patient is medically stable for discharge pending SNF placement.  Colonic Pseudobstruction - continue senokot bid and switched miralax from prn to daily - Dulcolax suppository ordered   HTN 152/94 -continue metoprolol 50 mg qd and lisinopril 10 mg qd.   Hypokalemia K 2.9, repleting orally  -potassium chloride 40 mEq bid once  Dispo: Anticipated discharge is today pending SNF.  Holly Flores, Holly Quest, DO 12/22/2017, 7:22 AM Pager: [phone]

## 2017-12-22 NOTE — Evaluation (Addendum)
Occupational Therapy Evaluation Patient Details Name: Holly Flores MRN: [id number] DOB: 04/17/1945 Today's Date: 12/22/2017    History of Present Illness Holly Flores is a 72yo female who comes to [facility] from home after progressive weakness. 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 PMH: PAF, HTN, hypoTSH.    Clinical Impression   PTA, pt was living with her husband and required assistance with ADLs and performing transfers to w/c with hemi-walker. Pt currently requiring Mod A for UB ADLs, Max A for LB ADLs, and Max A for stand pivot to recliner. Pt with prior CVA and right hemiparesis. Pt presenting with decreased strength, balance, and activity tolerance. Pt highly motivated to participate in therapy. Pt would benefit from further acute OT to facilitate safe dc. Recommend dc to SNF for further OT to optimize safety, independence with ADLs, and return to PLOF.      Follow Up Recommendations  SNF;Supervision/Assistance - 24 hour    Equipment Recommendations  None recommended by OT    Recommendations for Other Services PT consult     Precautions / Restrictions Precautions Precautions: Fall Precaution Comments: Right hemi      Mobility Bed Mobility Overal bed mobility: Needs Assistance Bed Mobility: Supine to Sit     Supine to sit: Max assist     General bed mobility comments: Pt required Max A for LE, trunk, and protection of RUE to sit EOB  Transfers Overall transfer level: Needs assistance Equipment used: None Transfers: Sit to/from [employer] Sit to Stand: Max assist   Squat pivot transfers: Max assist     General transfer comment: Pt required Max A to power up, maintain standing balance, and block her L knee. Presenting with R lean with standing. Transferred to R side.    Balance Overall balance assessment: Needs assistance Sitting-balance support: Feet supported;No upper extremity supported Sitting  balance-Leahy Scale: Fair     Standing balance support: During functional activity;Single extremity supported Standing balance-Leahy Scale: Poor                             ADL either performed or assessed with clinical judgement   ADL Overall ADL's : Needs assistance/impaired Eating/Feeding: Minimal assistance;Sitting   Grooming: Wash/dry hands;Maximal assistance;Bed level Grooming Details (indicate cue type and reason): During washing and drying R hand Pt required Max A for stability and lack of function in R hand Upper Body Bathing: Moderate assistance;Sitting   Lower Body Bathing: Maximal assistance;+2 for physical assistance;Sit to/from stand;Bed level   Upper Body Dressing : Maximal assistance;Sitting Upper Body Dressing Details (indicate cue type and reason): Don/doff new gown while seated in recliner. Lower Body Dressing: Maximal assistance;+2 for physical assistance;Sit to/from stand;Bed level   Toilet Transfer: Maximal assistance;Squat-pivot;+2 for safety/equipment(Simulated to recliner.) Toilet Transfer Details (indicate cue type and reason): Pt required Max A to transfer to recliner.         Functional mobility during ADLs: Maximal assistance;+2 for safety/equipment(Squat pivot only) General ADL Comments: Pt presented with R hemi, weakness, and fear of falling.     Vision Baseline Vision/History: No visual deficits       Perception     Praxis      Pertinent Vitals/Pain Pain Assessment: Faces Faces Pain Scale: Hurts a little bit Pain Location: abdomen Pain Descriptors / Indicators: Aching;Discomfort;Grimacing;Sore Pain Intervention(s): Limited activity within patient's tolerance;Monitored during session;Repositioned     Hand Dominance  Right(Now right hemi)   Extremity/Trunk Assessment Upper Extremity Assessment Upper Extremity Assessment: RUE deficits/detail RUE Deficits / Details: sensation intact, no tract activation noted when screening  and cued verbally. Noted edema RUE Coordination: decreased fine motor;decreased gross motor   Lower Extremity Assessment Lower Extremity Assessment: Defer to PT evaluation   Cervical / Trunk Assessment Cervical / Trunk Assessment: Other exceptions Cervical / Trunk Exceptions: Increased body habitus   Communication Communication Communication: No difficulties   Cognition Arousal/Alertness: Awake/alert Behavior During Therapy: WFL for tasks assessed/performed Overall Cognitive Status: Within Functional Limits for tasks assessed                                     General Comments       Exercises Exercises: General Upper Extremity;Other exercises General Exercises - Upper Extremity Shoulder Flexion: PROM;Right;5 reps;Supine Shoulder Extension: PROM;Right;5 reps;Supine Elbow Flexion: PROM;5 reps;Supine;Right Elbow Extension: PROM;Right;5 reps;Supine Wrist Flexion: PROM;Right;5 reps;Supine Wrist Extension: PROM;Right;5 reps;Supine Digit Composite Flexion: PROM;Right;5 reps;Supine Composite Extension: PROM;Right;5 reps;Supine Other Exercises Other Exercises: Performed retrograde massage and elevated RUE.   Shoulder Instructions      Home Living Family/patient expects to be discharged to:: Private residence Living Arrangements: Spouse/significant other(Husband) Available Help at Discharge: Family Type of Home: House Home Access: [place]: One level     Bathroom Shower/Tub: Occupational psychologist: [place] - single point;Walker - 2 wheels;Shower seat;Bedside commode;Wheelchair - manual;Hospital bed;Other (comment)(hemiwalker)   Additional Comments: pt owns , w/c, RW, cane      Prior Functioning/Environment Level of Independence: Needs assistance  Gait / Transfers Assistance Needed: Performed stand pivots to w/c with hemiwalker and husband assistance. ADL's / Homemaking Assistance Needed:  Husband assisting with ADLs including bathing/dressing            OT Problem List: Decreased strength;Decreased range of motion;Decreased activity tolerance;Impaired balance (sitting and/or standing);Decreased safety awareness;Decreased knowledge of use of DME or AE;Decreased knowledge of precautions;Pain;Impaired UE functional use      OT Treatment/Interventions: Self-care/ADL training;Therapeutic exercise;Energy conservation;DME and/or AE instruction;Therapeutic activities;Patient/family education    OT Goals(Current goals can be found in the care plan section) Acute Rehab OT Goals Patient Stated Goal: regain strength; progress independence at STR to improve self efficacy uponm return to home OT Goal Formulation: With patient Time For Goal Achievement: 01/05/18 Potential to Achieve Goals: Good  OT Frequency: Min 2X/week   Barriers to D/C:            Co-evaluation              AM-PAC PT "6 Clicks" Daily Activity     Outcome Measure Help from another person eating meals?: A Little Help from another person taking care of personal grooming?: A Little Help from another person toileting, which includes using toliet, bedpan, or urinal?: A Lot Help from another person bathing (including washing, rinsing, drying)?: A Lot Help from another person to put on and taking off regular upper body clothing?: A Lot Help from another person to put on and taking off regular lower body clothing?: A Lot 6 Click Score: 14   End of Session Equipment Utilized During Treatment: Gait belt Nurse Communication: Mobility status;Other (comment)(Pt nauseous at the end of treatment session.)  Activity Tolerance: Treatment limited secondary to medical complications (Comment) Patient left: in chair;with call bell/phone within reach;with chair alarm set  OT Visit Diagnosis: Unsteadiness on feet (R26.81);Other abnormalities of gait and mobility (R26.89);Muscle weakness (generalized) (M62.81);Pain Pain -  part of body: (Abdomen)                Time: 1464-3142 OT Time Calculation (min): 34 min Charges:  OT General Charges $OT Visit: 1 Visit OT Evaluation $OT Eval Moderate Complexity: 1 Mod OT Treatments $Self Care/Home Management : 8-22 mins  Zacheriah Stumpe MSOT, OTR/L Acute Rehab Pager: [phone] Office: [place] 12/22/2017, 12:30 PM

## 2017-12-22 NOTE — Clinical Social Work Placement (Signed)
   CLINICAL SOCIAL WORK PLACEMENT  NOTE  Date:  12/22/2017  Patient Details  Name: Holly Flores MRN: [id number] Date of Birth: Oct 03, 1945  Clinical Social Work is seeking post-discharge placement for this patient at the [place] level of care (*CSW will initial, date and re-position this form in  chart as items are completed):  Yes   Patient/family provided with [facility] Work Department's list of facilities offering this level of care within the geographic area requested by the patient (or if unable, by the patient's family).  Yes   Patient/family informed of their freedom to choose among providers that offer the needed level of care, that participate in Medicare, Medicaid or managed care program needed by the patient, have an available bed and are willing to accept the patient.  Yes   Patient/family informed of [facility]'s ownership interest in [facility] and [facility], as well as of the fact that they are under no obligation to receive care at these facilities.  PASRR submitted to EDS on 12/21/17     PASRR number received on 12/22/17     Existing PASRR number confirmed on       FL2 transmitted to all facilities in geographic area requested by pt/family on 12/21/17     FL2 transmitted to all facilities within larger geographic area on       Patient informed that his/her managed care company has contracts with or will negotiate with certain facilities, including the following:        Yes   Patient/family informed of bed offers received.  Patient chooses bed at [facility]     Physician recommends and patient chooses bed at      Patient to be transferred to [facility] on 12/22/17.  Patient to be transferred to facility by [facility]     Patient family notified on 12/22/17 of transfer.  Name of family member notified:  Daughter, Joelene Millin     PHYSICIAN       Additional Comment:     _______________________________________________ Benard Halsted, LCSW 12/22/2017, 3:44 PM

## 2017-12-22 NOTE — Discharge Summary (Signed)
Name: Holly Flores MRN: [id number] DOB: [birth year] 72 y.o. PCP: Holly Graves, DO  Date of Admission: 12/19/2017 12:54 PM Date of Discharge: 12/22/2017 Attending Physician: Axel Filler, *  Discharge Diagnosis: 1. 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 HTN 3. Right upper extremity swelling 4. Chronic constipation due to colonic pseudobstruction  Discharge Medications: Allergies as of 12/22/2017      Reactions   Penicillins Hives, Itching, Rash   Has patient had a PCN reaction causing immediate rash, facial/tongue/throat swelling, SOB or lightheadedness with hypotension: Yes Has patient had a PCN reaction causing severe rash involving mucus membranes or skin necrosis: Yes Has patient had a PCN reaction that required hospitalization: No Has patient had a PCN reaction occurring within the last 10 years: No If all of the above answers are "NO", then may proceed with Cephalosporin use.   Sulfa Antibiotics Swelling   Tongue swelling      Medication List    STOP taking these medications   NEXIUM 40 MG capsule Generic drug:  esomeprazole     TAKE these medications   acetaminophen 325 MG tablet Commonly known as:  TYLENOL Take 2 tablets (650 mg total) by mouth every 6 (six) hours as needed for mild pain (or Fever >/= 101).   albuterol 108 (90 Base) MCG/ACT inhaler Commonly known as:  PROVENTIL HFA;VENTOLIN HFA Inhale 2 puffs into the lungs every 6 (six) hours as needed (for wheezing/shortness of breath).   ALPRAZolam 0.5 MG tablet Commonly known as:  XANAX Take 1 tablet (0.5 mg total) by mouth at bedtime.   apixaban 5 MG Tabs tablet Commonly known as:  ELIQUIS Take 1 tablet (5 mg total) by mouth 2 (two) times daily.   atorvastatin 80 MG tablet Commonly known as:  LIPITOR Take 1 tablet (80 mg total) by mouth daily.   bisacodyl 10 MG suppository Commonly known as:  DULCOLAX Place 1 suppository (10 mg total) rectally daily as needed for  moderate constipation.   cyclobenzaprine 5 MG tablet Commonly known as:  FLEXERIL Take 1 tablet (5 mg total) by mouth at bedtime.   DULoxetine 30 MG capsule Commonly known as:  CYMBALTA Take 30 mg by mouth daily.   levothyroxine 50 MCG tablet Commonly known as:  SYNTHROID, LEVOTHROID Take 1 tablet (50 mcg total) by mouth daily before breakfast.   lisinopril 10 MG tablet Commonly known as:  PRINIVIL,ZESTRIL Take 1 tablet (10 mg total) by mouth daily. Start taking on:  12/23/2017   metoprolol succinate 50 MG 24 hr tablet Commonly known as:  TOPROL-XL Take 50 mg by mouth daily.   omega-3 acid ethyl esters 1 g capsule Commonly known as:  LOVAZA Take 2 capsules (2 g total) by mouth daily.   pantoprazole 40 MG tablet Commonly known as:  PROTONIX Take 1 tablet (40 mg total) by mouth daily.   polyethylene glycol packet Commonly known as:  MIRALAX / GLYCOLAX Take 17 g by mouth daily.   potassium chloride SA 20 MEQ tablet Commonly known as:  K-DUR,KLOR-CON Take 1 tablet (20 mEq total) by mouth daily.   senna-docusate 8.6-50 MG tablet Commonly known as:  Senokot-S Take 2 tablets by mouth 2 (two) times daily.   topiramate 25 MG tablet Commonly known as:  TOPAMAX Take 1 tablet (25 mg total) by mouth 2 (two) times daily.       Disposition and follow-up:   [name] was discharged from [facility] in Stable condition.  At the hospital  follow up visit please address:  1.  Deconditioning- patient requires more consistent rehab following stroke in 9/19; assess progress from SNF  2. HTN- patient noted to have elevated blood pressures during hospital course on metoprolol, added lisinopril 10 mg daily, assess compliance and effectiveness  3.  Labs / imaging needed at time of follow-up: none  4.  Pending labs/ test needing follow-up: none  Follow-up Appointments: Follow-up Information    Pieter Partridge, DO. Schedule an appointment as soon as possible  for a visit in 6 week(s).   Specialty:  Neurology Contact information: [address] [place] [address] [place] by problem list: 1. Physical Deconditioning 2/2 prior stroke- patient was brought to the hospital by family because of worsening left sided weakness and declining functional status at home.  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  She was treated at inpatient rehabilitation at [facility]. She was discharged to home October 23 and has been functionally declining since that time.  Daughter stated that while in inpatient rehab she was able to do some walking with assistance and stand on her left leg to be able to transition.  However since being home she has been much more sedentary and is now too weak to stand on her left leg.  On exam she has 5 out of 5 strength in the left lower and upper extremity. Repeat CT and MRI ruled out a new acute stroke.  Most likely struggling with de-conditioning and functional status in the face of this new right-sided hemiparesis. She was discharged to SNF for more PT to improve functional status.   2. HTN- patient takes metoprolol 50 mg daily. She remained hypertensive on this admission so she was started on lisinopril 10 mg.  3. Right upper extremity swelling- patient had 2+ pitting edema of right upper extremity. Vascular ultrasound was normal, no evidence of DVT. Most likely in the setting of hemiparesis and patient not using her right hand.   4. Chronic constipation due to colonic pseudobstruction- Patient had severe colonic distension on last admission in September which required decompression. KUB shows persistent dilatation but improvement from before. Abdominal film showed gaseous tension of colon, potentially ileus or colonic obstruction. She was started on senokot bid and miralax prn which was  switched to daily. Recommend continuing senokot-s bid, miralax daily and dulcolax suppositories as needed if patient has not had a bowel movement in 1-2 days.   Discharge Vitals:   BP (!) 152/94 (BP Location: Left Arm)   Pulse 77   Temp 98.6 F (37 C)   Resp 19   Ht 5\' 1"  (1.549 m)   Wt 97.5 kg   SpO2 91%   BMI 40.61 kg/m   Pertinent Labs, Studies, and Procedures:   CBC Latest Ref Rng & Units 12/19/2017 12/06/2017 11/30/2017  WBC 4.0 - 10.5 K/uL 7.5 8.8 7.1  Hemoglobin 12.0 - 15.0 g/dL 14.1 12.2 11.4(L)  Hematocrit 36.0 - 46.0 % 45.3 39.2 35.8(L)  Platelets 150 - 400 K/uL 296 289 208   CMP Latest Ref Rng & Units 12/22/2017 12/21/2017 12/20/2017  Glucose 70 - 99 mg/dL 117(H) 106(H) 105(H)  BUN 8 - 23 mg/dL 14 11 11   Creatinine 0.44 - 1.00 mg/dL 1.10(H) 1.10(H) 1.13(H)  Sodium 135 - 145 mmol/L 138 138 137  Potassium  3.5 - 5.1 mmol/L 2.9(L) 3.1(L) 3.4(L)  Chloride 98 - 111 mmol/L 108 106 107  CO2 22 - 32 mmol/L 22 22 23   Calcium 8.9 - 10.3 mg/dL 9.6 9.4 9.0  Total Protein 6.5 - 8.1 g/dL - - -  Total Bilirubin 0.3 - 1.2 mg/dL - - -  Alkaline Phos 38 - 126 U/L - - -  AST 15 - 41 U/L - - -  ALT 0 - 44 U/L - - -   Dg Chest 2 View  Result Date: 12/19/2017 CLINICAL DATA:  72 year old female with a history of mid chest pain EXAM: CHEST - 2 VIEW COMPARISON:  11/02/2017, 01/16/2015 FINDINGS: Cardiomediastinal silhouette unchanged in size and contour. Low lung volumes. Patchy opacities bilaterally, new from the prior. No pneumothorax or pleural effusion. Surgical changes of the cervical region. IMPRESSION: Patchy airspace opacities, new from the prior, concerning for multifocal pneumonia though edema cannot be excluded. Electronically Signed   By: Corrie Mckusick D.O.   On: 12/19/2017 15:06   Ct Head Wo Contrast  Result Date: 12/19/2017 CLINICAL DATA:  72 year old female with a history chest pain EXAM: CT HEAD WITHOUT CONTRAST TECHNIQUE: Contiguous axial images were obtained from the base of  the skull through the vertex without intravenous contrast. COMPARISON:  CT 10/29/2017, MR 10/29/2017 FINDINGS: Brain: No acute intracranial hemorrhage. No midline shift or mass effect. Gray-white differentiation maintained. Involving hypodensity in the posterior left basal ganglia. Unremarkable configuration of the ventricles. Vascular: Calcifications of intracranial vasculature. Skull: No acute displaced fracture.  No aggressive bony lesions. Sinuses/Orbits: No acute finding. Other: None. IMPRESSION: No acute intracranial abnormality. Evolution of known left basal ganglia infarction. Electronically Signed   By: Corrie Mckusick D.O.   On: 12/19/2017 15:12   Mr Jodene Nam Head Wo Contrast  Result Date: 12/19/2017 CLINICAL DATA:  Focal neuro deficit, suspect stroke.  Recent stroke. EXAM: MRI HEAD WITHOUT CONTRAST MRA HEAD WITHOUT CONTRAST TECHNIQUE: Multiplanar, multiecho pulse sequences of the brain and surrounding structures were obtained without intravenous contrast. Angiographic images of the head were obtained using MRA technique without contrast. COMPARISON:  CT head 12/19/2017, MRI head 10/29/2017 FINDINGS: MRI HEAD FINDINGS Brain: Resolving moderately large infarct in the posterior limb internal capsule on the left. Moderate to extensive chronic microvascular ischemic changes in the white matter. Mild chronic ischemic change in the thalamus bilaterally. Ventricle size normal.  Mild atrophy.  No mass lesion identified. Negative for acute infarct. Chronic microhemorrhage left internal capsule unchanged from the prior study. Vascular: Normal arterial flow void Skull and upper cervical spine: Negative.  ACDF cervical spine Sinuses/Orbits: Paranasal sinuses clear. Bilateral cataract surgery. Other: None MRA HEAD FINDINGS Both vertebral arteries patent to the basilar. Left PICA patent. Right PICA not visualized however there is a prominent right AICA which may supply this territory. Basilar widely patent. Posterior  cerebral arteries patent bilaterally. Superior cerebellar arteries patent bilaterally. 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 Internal carotid artery widely patent bilaterally. Anterior and middle cerebral arteries patent bilaterally without significant stenosis or large vessel occlusion. IMPRESSION: 1. Negative for acute infarct. Recent left posterior limb internal capsule infarct shows continued evolution. Moderate to extensive chronic microvascular ischemia. 2. No significant intracranial stenosis or large vessel occlusion 3. 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 Electronically Signed   By: Franchot Gallo M.D.   On: 12/19/2017 18:19   Mr Brain Wo Contrast  Result Date: 12/19/2017 CLINICAL DATA:  Focal neuro deficit, suspect stroke.  Recent stroke. EXAM: MRI HEAD WITHOUT CONTRAST MRA HEAD WITHOUT CONTRAST TECHNIQUE: Multiplanar, multiecho pulse sequences of the brain and surrounding structures were obtained without intravenous contrast. Angiographic images of the head were obtained using MRA technique without contrast. COMPARISON:  CT head 12/19/2017, MRI head 10/29/2017 FINDINGS: MRI HEAD FINDINGS Brain: Resolving moderately large infarct in the posterior limb internal capsule on the left. Moderate to extensive chronic microvascular ischemic changes in the white matter. Mild chronic ischemic change in the thalamus bilaterally. Ventricle size normal.  Mild atrophy.  No mass lesion identified. Negative for acute infarct. Chronic microhemorrhage left internal capsule unchanged from the prior study. Vascular: Normal arterial flow void Skull and upper cervical spine: Negative.  ACDF cervical spine Sinuses/Orbits: Paranasal sinuses clear. Bilateral cataract surgery. Other: None MRA HEAD FINDINGS Both vertebral arteries patent to the basilar. Left PICA patent. Right PICA not  visualized however there is a prominent right AICA which may supply this territory. Basilar widely patent. Posterior cerebral arteries patent bilaterally. Superior cerebellar arteries patent bilaterally. 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 Internal carotid artery widely patent bilaterally. Anterior and middle cerebral arteries patent bilaterally without significant stenosis or large vessel occlusion. IMPRESSION: 1. Negative for acute infarct. Recent left posterior limb internal capsule infarct shows continued evolution. Moderate to extensive chronic microvascular ischemia. 2. No significant intracranial stenosis or large vessel occlusion 3. 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 Electronically Signed   By: Franchot Gallo M.D.   On: 12/19/2017 18:19   Dg Abd 2 Views  Result Date: 12/19/2017 CLINICAL DATA:  72 year old female with a history of chest pain EXAM: ABDOMEN - 2 VIEW COMPARISON:  11/14/2017, 11/13/2017, 11/12/2017 FINDINGS: Gaseous distention of colon, predominantly transverse colon. Gas extends through descending colon. Small amount of gas in the stomach and small bowel. Surgical clips of cholecystectomy. Surgical changes of the lumbar region. No displaced fracture. IMPRESSION: Gaseous distention of colon, potentially ileus or colonic obstruction. Electronically Signed   By: Corrie Mckusick D.O.   On: 12/19/2017 15:05   Vas [place] Upper Extremity Venous Duplex  Result Date: 12/20/2017 UPPER VENOUS STUDY  Indications: Edema Other Indications: History of CVA. Performing Technologist: Toma Copier RVS  Examination Guidelines: A complete evaluation includes B-mode imaging, spectral Doppler, color Doppler, and power Doppler as needed of all accessible portions of each vessel. Bilateral testing is considered an integral part of a complete examination. Limited  examinations for reoccurring indications may be performed as noted.  Right Findings: +----------+------------+----------+---------+-----------+-------+ RIGHT     CompressiblePropertiesPhasicitySpontaneousSummary +----------+------------+----------+---------+-----------+-------+ IJV           Full                 Yes       Yes            +----------+------------+----------+---------+-----------+-------+ Subclavian    Full                 Yes       Yes            +----------+------------+----------+---------+-----------+-------+ Axillary      Full                 Yes       Yes            +----------+------------+----------+---------+-----------+-------+ Brachial      Full  Yes       Yes            +----------+------------+----------+---------+-----------+-------+ Radial        Full                 Yes       Yes            +----------+------------+----------+---------+-----------+-------+ Ulnar         Full                                          +----------+------------+----------+---------+-----------+-------+ Cephalic      Full                 Yes       Yes            +----------+------------+----------+---------+-----------+-------+ Basilic       Full                 Yes       Yes            +----------+------------+----------+---------+-----------+-------+  Left Findings: +----------+------------+----------+---------+-----------+-------+ LEFT      CompressiblePropertiesPhasicitySpontaneousSummary +----------+------------+----------+---------+-----------+-------+ Subclavian    Full                 Yes       Yes            +----------+------------+----------+---------+-----------+-------+  Summary:  Right: No evidence of deep vein thrombosis in the upper extremity. No evidence of superficial vein thrombosis in the upper extremity. No evidence of thrombosis in the subclavian.  Left: No evidence of thrombosis in the  subclavian.  *See table(s) above for measurements and observations.  Diagnosing physician: Ruta Hinds MD Electronically signed by Ruta Hinds MD on 12/20/2017 at 6:56:39 PM.    Final     Discharge Instructions: Discharge Instructions    Ambulatory referral to Neurology   Complete by:  As directed    An appointment is requested in approximately: 4-6 weeks   Diet - low sodium heart healthy   Complete by:  As directed    Discharge instructions   Complete by:  As directed    Ms. Cazeau,  Please start taking lisinopril 10 mg daily for your hypertension. Also take Miralax once daily and senokot-s twice daily to help with constipation. Use Dulcolax in addition to miralax and senokot as needed if you have not had a bowel movement in 1-2 days.   Increase activity slowly   Complete by:  As directed       Signed: Mike Craze, DO 12/22/2017, 11:58 AM   Pager: [phone]

## 2017-12-22 NOTE — Progress Notes (Signed)
Patient will DC to: [facility] Anticipated DC date: 12/22/17 Family notified: Daughter, Environmental manager by: Corey Harold (behind)   Per MD patient ready for DC to [facility]. RN, patient, patient's family, and facility notified of DC. Discharge Summary and FL2 sent to facility. RN to call report prior to discharge [phone]). DC packet on chart. Ambulance transport requested for patient.   CSW will sign off for now as social work intervention is no longer needed. Please consult [place] again if new needs arise.  Cedric Fishman, LCSW Clinical Social Worker [phone]

## 2017-12-22 NOTE — Care Management Important Message (Signed)
Important Message  Patient Details  Name: ELVERIA LAUDERBAUGH MRN: [id number] Date of Birth: [birth year]   Medicare Important Message Given:  Yes    Lucas Winograd [address] 12/22/2017, 2:47 PM

## 2017-12-22 NOTE — Progress Notes (Signed)
Report given to receiving RN at [facility].

## 2017-12-22 NOTE — Progress Notes (Signed)
Passr: [phone] Sharrell Ku Aariyah Sampey LCSW [phone]

## 2018-07-21 ENCOUNTER — Ambulatory Visit: Payer: Medicare Other | Attending: *Deleted | Admitting: Physical Therapy

## 2018-07-21 ENCOUNTER — Encounter: Payer: Self-pay | Admitting: Physical Therapy

## 2018-07-21 ENCOUNTER — Other Ambulatory Visit: Payer: Self-pay

## 2018-07-21 DIAGNOSIS — R262 Difficulty in walking, not elsewhere classified: Secondary | ICD-10-CM

## 2018-07-21 DIAGNOSIS — M6281 Muscle weakness (generalized): Secondary | ICD-10-CM | POA: Diagnosis not present

## 2018-07-21 DIAGNOSIS — G8929 Other chronic pain: Secondary | ICD-10-CM | POA: Diagnosis present

## 2018-07-21 DIAGNOSIS — M25511 Pain in right shoulder: Secondary | ICD-10-CM | POA: Insufficient documentation

## 2018-07-21 NOTE — Therapy (Signed)
[facility] [place], [place], [address] Phone: [phone]   Fax:  [phone]  Physical Therapy Evaluation  Patient Details  Name: Holly Flores MRN: [id number] Date of Birth: 1945-10-15 Referring Provider (PT): Octavio Graves, DO   Encounter Date: 07/21/2018  PT End of Session - 07/21/18 1616    Visit Number  1    Number of Visits  12    Date for PT Re-Evaluation  09/08/18    Authorization Type  Progress note every 10th visit; KX modifier at 15th visit.    PT Start Time  1300    PT Stop Time  1358    PT Time Calculation (min)  58 min    Equipment Utilized During Treatment  Gait belt;Other (comment)   wheel chair, slide board   Activity Tolerance  Patient limited by fatigue    Behavior During Therapy  [facility] for tasks assessed/performed       Past Medical History:  Diagnosis Date  . Anxiety   . Arthritis    "all over" (12/20/2017)  . Central retinal vein occlusion of left eye 01/22/2015  . CHF (congestive heart failure) ([place])   . Chronic bronchitis ([place])    "get it most years" (12/20/2017)  . Chronic lower back pain   . CKD (chronic kidney disease), stage II   . COPD (chronic obstructive pulmonary disease) ([place])   . CVA (cerebral vascular accident) ([place]) 12/30/2014   OCULAR  LEFT  EYE   . Depression   . Fibromyalgia   . GERD (gastroesophageal reflux disease)   . Goiter   . History of colonic polyps 05/14/2014  . Hyperlipidemia   . Hypertension   . Hypothyroidism   . Kidney cysts    left side  . 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 Migraine    "couple times/wk; been having them for a long time" (12/20/2017)  . Myocardial infarction [facility])    "one dr said I did; one said I didn't;  don't remember when it was" (12/20/2017)  . 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 On home oxygen therapy    "3L; sleep w/it q night" (12/20/2017)  . Pneumonia    "several times" (12/20/2017)   . Shortness of breath   . Sleep apnea    "stopped mask when I got on the oxygen" (12/20/2017)  . Stroke [facility])     Past Surgical History:  Procedure Laterality Date  . ANTERIOR CERVICAL DECOMP/DISCECTOMY FUSION    . BACK SURGERY    . BREAST BIOPSY     left-non cancerous  . CARDIAC CATHETERIZATION    . CARPAL TUNNEL RELEASE Left   . CATARACT EXTRACTION W/PHACO  09/21/2011   Procedure: CATARACT EXTRACTION PHACO AND INTRAOCULAR LENS PLACEMENT (IOC);  Surgeon: Williams Che, MD;  Location: [facility];  Service: Ophthalmology;  Laterality: Left;  CDE:9.78  . CATARACT EXTRACTION W/PHACO Right 10/15/2014   Procedure: CATARACT EXTRACTION PHACO AND INTRAOCULAR LENS PLACEMENT ([place]);  Surgeon: Williams Che, MD;  Location: [facility];  Service: Ophthalmology;  Laterality: Right;  CDE:4.19  . [place]; 1972  . CHOLECYSTECTOMY OPEN    . COLONOSCOPY  2006   RMR: 1. Internal hemorroids, otherwise normal rectum. 2. Pedunculated polyp at 35 cm. reomved with snare. The remainder of teh colonic mucosa appeared normal.   . COLONOSCOPY  2011   Dr. Gala Romney: multiple ascending colon polyps and rectal polyp, adenomatous  . COLONOSCOPY N/A  05/31/2014   Procedure: COLONOSCOPY;  Surgeon: Daneil Dolin, MD;  Location: [facility];  Service: Endoscopy;  Laterality: N/A;  130  . COLONOSCOPY WITH PROPOFOL N/A 06/24/2017   Procedure: COLONOSCOPY WITH PROPOFOL;  Surgeon: Daneil Dolin, MD;  Location[facility];  Service: Endoscopy;  Laterality: N/A;  2:15pm  . ESOPHAGOGASTRODUODENOSCOPY  2006   Dr. Gala Romney: Subtle Schatzkis ring, otherwise normal upper GI tract, aside from a small pyloric channell erosion, status post dilation as described above.   Marland Kitchen FLEXIBLE SIGMOIDOSCOPY N/A 11/11/2017   Procedure: FLEXIBLE SIGMOIDOSCOPY;  Surgeon: Otis Brace, MD;  Location: [place];  Service: Gastroenterology;  Laterality: N/A;  . JOINT REPLACEMENT    . LUMBAR DISC SURGERY  X 2  . POLYPECTOMY  06/24/2017    Procedure: POLYPECTOMY;  Surgeon: Daneil Dolin, MD;  [facility];  Service: Endoscopy;;  ascending  . POSTERIOR LUMBAR FUSION  ? date 1st fusion ; 07/2006   previous L4-5 Ray threaded fusion cage; Exploration of L4-5 fusion & PLIF 07/22/2006/notes 07/22/2006  . TONSILLECTOMY    . TOTAL KNEE ARTHROPLASTY Right 05/24/2013   Procedure: TOTAL KNEE ARTHROPLASTY;  Surgeon: [name], MD;  Location: [place];  Service: Orthopedics;  Laterality: Right;  . TOTAL KNEE ARTHROPLASTY Left   . VAGINAL HYSTERECTOMY      There were no vitals filed for this visit.   Subjective Assessment - 07/21/18 1612    Subjective  COVID-19 screening performed prior to patient entering the building. Patient arrives to physical therapy with her daughter with reports of right sided weakness due to a CVA of the left external capsule on 10/30/2018. Patient had skilled physical therapy at [facility] to which patient's daughter reported significant functional gains but had to stop due to a diagnosis of Ileus. Patient reports right shoulder pain that varies in intensity. Patient obtains assistance from family for dressing and other ADLs. She reports being independent with showering with a shower bench once she gains assistance in getting into the shower. Patient requires supervision to min A for slide board transfers from wheelchair to various surfaces. Patient's daughter states she does HEPs provided by previous therapists but states she is not confident in her left unaffected side. Patient's goals are to improve function, improve mobility, improve strength, and be more independent with moving around her home.     Patient is accompained by:  Family member   Daughter   Pertinent History  left sided CVA 10/29/17, Chronic CHF, AFib, COPD, Chronic Kidney Disease stage III, hx of bil TKA, Lumbar surgery    Limitations  Lifting;Standing;Walking;House hold activities    Diagnostic tests  MRI: left external capsule CVA    Patient  Stated Goals  move around better in the home and improve function    Currently in Pain?  Yes    Pain Score  5     Pain Location  Shoulder    Pain Orientation  Right    Pain Descriptors / Indicators  Sore    Pain Type  Chronic pain    Pain Onset  More than a month ago    Pain Frequency  Constant    Aggravating Factors   pulling, forceful movements    Pain Relieving Factors  massage, pain relief cream    Effect of Pain on Daily Activities  requires assistance for ADLs         [facility] PT Assessment - 07/21/18 0001      Assessment  Medical Diagnosis  Left sided CVA    Referring Provider (PT)  Octavio Graves, DO    Onset Date/Surgical Date  10/29/17    Hand Dominance  Right    Next MD Visit  n/a    Prior Therapy  yes, inpatient      Precautions   Precautions  Fall      Balance Screen   Has the patient fallen in the past 6 months  No    Has the patient had a decrease in activity level because of a fear of falling?   Yes    Is the patient reluctant to leave their home because of a fear of falling?   No      Home Environment   Living Environment  Private residence    Living Arrangements  Spouse/significant other    Available Help at Discharge  Family      Prior Function   Level of Independence  Needs assistance with transfers;Needs assistance with homemaking;Needs assistance with ADLs    Comments  mod I for bathing, req. assistance for getting in/out shower. Utilizes shower bench      Cognition   Overall Cognitive Status  Within Functional Limits for tasks assessed      Observation/Other Assessments   Observations  Soft AFO foot drop brace donned      Sensation   Light Touch  Appears Intact      Tone   Assessment Location  Right Upper Extremity;Right Lower Extremity      ROM / Strength   AROM / PROM / Strength  Strength      Strength   Overall Strength Comments  Right shoulder: 0/5 in all planes; Right LE: trace knee extension    Strength Assessment Site   Shoulder;Knee    Right/Left Shoulder  Left    Left Shoulder Flexion  3-/5    Left Shoulder ABduction  3-/5    Left Shoulder Internal Rotation  4/5    Left Shoulder External Rotation  4/5    Right/Left Knee  Left    Left Knee Flexion  4-/5    Left Knee Extension  4/5      Palpation   Palpation comment  tender to palpation to right shoulder, decreased muscle mass in posterior RTC muscles      Transfers   Transfers  Sit to Stand;Stand to Sit;Lateral/Scoot Transfers    Sit to Stand  3: Mod assist;With upper extremity assist;With armrests;Multiple attempts    Sit to Stand Details  Manual facilitation for weight shifting;Manual facilitation for placement    Stand to Sit  3: Mod assist;With armrests;With upper extremity assist    Lateral/Scoot Transfers  4: Min assist;With armrests removed;With slide board      Ambulation/Gait   Gait Comments  not assessed      RUE Tone   RUE Tone  Flaccid      RLE Tone   RLE Tone  Flaccid                Objective measurements completed on examination: See above findings.              PT Education - 07/21/18 1614    Education Details  marching, LAQ, heel raises, toe raises, shoulder flexion, adduction isometric, shoulder extension isometric    Person(s) Educated  Patient;Child(ren)    Methods  Explanation;Handout    Comprehension  Verbalized understanding          PT Long Term Goals - 07/21/18  [place] #1   Title  Patient will be independent with HEP    Time  4    Period  Weeks    Status  New      PT LONG TERM GOAL #2   Title  Patient will transfer with supervision from wheelchair to plinth with slideboard    Time  4    Period  Weeks    Status  New      PT LONG TERM GOAL #3   Title  Patient will demonstrate sit to stand transfer with min A to close supervision assist.    Time  4    Period  Weeks    Status  New      PT LONG TERM GOAL #4   Title  Patient will demonstrate standing tolerance  to 2 minutes to perform functional tasks for ADLs.    Time  4    Period  Weeks    Status  New      PT LONG TERM GOAL #5   Title  Patient will demonstrate 4+/5 or greater in left unaffected LE and UE to improve ability to perform functional tasks.    Time  4    Period  Weeks    Status  New      Additional Long Term Goals   Additional Long Term Goals  Yes      PT LONG TERM GOAL #6   Title  Patient will ambulate 40 feet or greater with min A and use of hemi walker to safely navigate around home.    Time  4    Period  Weeks    Status  New             Plan - 07/21/18 1617    Clinical Impression Statement  Patient is a 73 year old female who present to physical therapy with her daughter with right sided UE and LE weakness secondary to a left CVA of the external capsule on 10/30/2018. Patient noted with trace contractions of LE with exception of DF and zero palpable or observable contractions of the right UE. Patient's left LE & UE MMT varied from fair to good minus. Patient was able transfer with use of slide board from [facility] to plinth with min A. Patient requires Min to Mod A to stand to hemi walker. Patient's gait was not assessed at this time. Patient's primary mode of transportation is the wheelchair. Patient and PT discussed HEP to strengthen unaffected left extremities. Patient would benefit from skilled physical therapy to address deficits and address patient's goals.     Personal Factors and Comorbidities  Age;Comorbidity 3+    Examination-Activity Limitations  Lift;Dressing;Self Feeding;Transfers;Toileting;Stand;Locomotion Level;Reach Overhead    Stability/Clinical Decision Making  Stable/Uncomplicated    Clinical Decision Making  Moderate    Rehab Potential  Fair    PT Frequency  3x / week    PT Duration  4 weeks    PT Treatment/Interventions  ADLs/Self Care Home Management;Electrical Stimulation;Gait training;Stair training;Functional mobility training;Balance  training;Therapeutic exercise;Therapeutic activities;Neuromuscular re-education;Wheelchair mobility training;Manual techniques;Passive range of motion;Patient/family education;DME Instruction;Energy conservation    PT Next Visit Plan  general LE strengthening, transfers, core stabilization, nustep if able    PT Home Exercise Plan  see patient education section    Consulted and Agree with Plan of Care  Patient       Patient will benefit from skilled therapeutic intervention in order to improve the  following deficits and impairments:  Pain, Postural dysfunction, Impaired tone, Decreased mobility, Decreased activity tolerance, Decreased range of motion, Decreased strength, Impaired UE functional use, Difficulty walking  Visit Diagnosis: Muscle weakness (generalized)  Chronic right shoulder pain  Difficulty in walking, not elsewhere classified     Problem List Patient Active Problem List   Diagnosis Date Noted  . Physical deconditioning 12/19/2017  . Chronic kidney disease (CKD), stage III (moderate) ([facility])   . Hypoalbuminemia due to protein-calorie malnutrition ([place])   . Intractable migraine without status migrainosus   . Reactive depression   . Hemiparesis affecting right side as late effect of stroke ([place])   . Slow transit constipation   . PAF (paroxysmal atrial fibrillation) ([place])   . Dysphasia, post-stroke   . COPD (chronic obstructive pulmonary disease) ([place]) 10/29/2017  . Chronic diastolic CHF (congestive heart failure) ([place]) 10/29/2017  . Hypothyroidism 10/29/2017  . Essential hypertension 10/29/2017  . GERD (gastroesophageal reflux disease) 10/29/2017  . Anxiety 10/29/2017  . 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 Hyperlipidemia 04/20/2013    Holly Flores 07/21/2018, 6:44 PM  [facility] [place], [place], [address] Phone: [phone]   Fax:  [phone]  Name: Holly Flores MRN:  [id number] Date of Birth: 1946-01-12

## 2018-07-25 ENCOUNTER — Other Ambulatory Visit: Payer: Self-pay

## 2018-07-25 ENCOUNTER — Ambulatory Visit: Payer: Medicare Other | Admitting: Physical Therapy

## 2018-07-25 DIAGNOSIS — R262 Difficulty in walking, not elsewhere classified: Secondary | ICD-10-CM

## 2018-07-25 DIAGNOSIS — G8929 Other chronic pain: Secondary | ICD-10-CM

## 2018-07-25 DIAGNOSIS — M6281 Muscle weakness (generalized): Secondary | ICD-10-CM | POA: Diagnosis not present

## 2018-07-25 NOTE — Therapy (Signed)
[facility] [place], [place], [address] Phone: [phone]   Fax:  [phone]  Physical Therapy Treatment  Patient Details  Name: Holly Flores MRN: [id number] Date of Birth: 26-Mar-1945 Referring Provider (PT): Octavio Graves, DO   Encounter Date: 07/25/2018  PT End of Session - 07/25/18 1438    Visit Number  2    Number of Visits  12    Date for PT Re-Evaluation  09/08/18    Authorization Type  Progress note every 10th visit; KX modifier at 15th visit.    PT Start Time  1115    PT Stop Time  1200    PT Time Calculation (min)  45 min    Activity Tolerance  Patient limited by fatigue    Behavior During Therapy  [facility] for tasks assessed/performed       Past Medical History:  Diagnosis Date  . Anxiety   . Arthritis    "all over" (12/20/2017)  . Central retinal vein occlusion of left eye 01/22/2015  . CHF (congestive heart failure) ([place])   . Chronic bronchitis ([place])    "get it most years" (12/20/2017)  . Chronic lower back pain   . CKD (chronic kidney disease), stage II   . COPD (chronic obstructive pulmonary disease) ([place])   . CVA (cerebral vascular accident) ([place]) 12/30/2014   OCULAR  LEFT  EYE   . Depression   . Fibromyalgia   . GERD (gastroesophageal reflux disease)   . Goiter   . History of colonic polyps 05/14/2014  . Hyperlipidemia   . Hypertension   . Hypothyroidism   . Kidney cysts    left side  . 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 Migraine    "couple times/wk; been having them for a long time" (12/20/2017)  . Myocardial infarction [facility])    "one dr said I did; one said I didn't;  don't remember when it was" (12/20/2017)  . 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 On home oxygen therapy    "3L; sleep w/it q night" (12/20/2017)  . Pneumonia    "several times" (12/20/2017)  . Shortness of breath   . Sleep apnea    "stopped mask when I got on the oxygen"  (12/20/2017)  . Stroke [facility])     Past Surgical History:  Procedure Laterality Date  . ANTERIOR CERVICAL DECOMP/DISCECTOMY FUSION    . BACK SURGERY    . BREAST BIOPSY     left-non cancerous  . CARDIAC CATHETERIZATION    . CARPAL TUNNEL RELEASE Left   . CATARACT EXTRACTION W/PHACO  09/21/2011   Procedure: CATARACT EXTRACTION PHACO AND INTRAOCULAR LENS PLACEMENT (IOC);  Surgeon: Williams Che, MD;  Location: [facility];  Service: Ophthalmology;  Laterality: Left;  CDE:9.78  . CATARACT EXTRACTION W/PHACO Right 10/15/2014   Procedure: CATARACT EXTRACTION PHACO AND INTRAOCULAR LENS PLACEMENT ([place]);  Surgeon: Williams Che, MD;  Location: [facility];  Service: Ophthalmology;  Laterality: Right;  CDE:4.19  . [place]; 1972  . CHOLECYSTECTOMY OPEN    . COLONOSCOPY  2006   RMR: 1. Internal hemorroids, otherwise normal rectum. 2. Pedunculated polyp at 35 cm. reomved with snare. The remainder of teh colonic mucosa appeared normal.   . COLONOSCOPY  2011   Dr. Gala Romney: multiple ascending colon polyps and rectal polyp, adenomatous  . COLONOSCOPY N/A 05/31/2014   Procedure: COLONOSCOPY;  Surgeon: Daneil Dolin, MD;  [facility];  Service: Endoscopy;  Laterality: N/A;  130  . COLONOSCOPY WITH PROPOFOL N/A 06/24/2017   Procedure: COLONOSCOPY WITH PROPOFOL;  Surgeon: Daneil Dolin, MD;  [facility];  Service: Endoscopy;  Laterality: N/A;  2:15pm  . ESOPHAGOGASTRODUODENOSCOPY  2006   Dr. Gala Romney: Subtle Schatzkis ring, otherwise normal upper GI tract, aside from a small pyloric channell erosion, status post dilation as described above.   Marland Kitchen FLEXIBLE SIGMOIDOSCOPY N/A 11/11/2017   Procedure: FLEXIBLE SIGMOIDOSCOPY;  Surgeon: Otis Brace, MD;  Location: [place];  Service: Gastroenterology;  Laterality: N/A;  . JOINT REPLACEMENT    . LUMBAR DISC SURGERY  X 2  . POLYPECTOMY  06/24/2017   Procedure: POLYPECTOMY;  Surgeon: Daneil Dolin, MD;  [facility];   Service: Endoscopy;;  ascending  . POSTERIOR LUMBAR FUSION  ? date 1st fusion ; 07/2006   previous L4-5 Ray threaded fusion cage; Exploration of L4-5 fusion & PLIF 07/22/2006/notes 07/22/2006  . TONSILLECTOMY    . TOTAL KNEE ARTHROPLASTY Right 05/24/2013   Procedure: TOTAL KNEE ARTHROPLASTY;  Surgeon: [name], MD;  Location: [place];  Service: Orthopedics;  Laterality: Right;  . TOTAL KNEE ARTHROPLASTY Left   . VAGINAL HYSTERECTOMY      There were no vitals filed for this visit.  Subjective Assessment - 07/25/18 1113    Subjective  COVID-19 screening performed prior to patient entering the building. Patient reported ongoing right shoulder pain.    Pertinent History  left sided CVA 10/29/17, Chronic CHF, AFib, COPD, Chronic Kidney Disease stage III, hx of bil TKA, Lumbar surgery    Limitations  Lifting;Standing;Walking;House hold activities    Diagnostic tests  MRI: left external capsule CVA    Currently in Pain?  Yes    Pain Score  5          [facility] PT Assessment - 07/25/18 0001      Assessment   Medical Diagnosis  Left sided CVA    Referring Provider (PT)  Octavio Graves, DO    Onset Date/Surgical Date  10/29/17    Hand Dominance  Right    Next MD Visit  n/a    Prior Therapy  yes, inpatient      Precautions   Precautions  Bernerd Limbo Adult PT Treatment/Exercise - 07/25/18 0001      Self-Care   Self-Care  Other Self-Care Comments    Other Self-Care Comments   donning of soft sling, use during transfers      Exercises   Exercises  Knee/Hip;Shoulder      Knee/Hip Exercises: Seated   Long Arc Quad  Left;Other (comment)   2 minutes; AAROM to left with tapping for facilitation   Ball Squeeze  hip adduction x2 minutes    Other Seated Knee/Hip Exercises  hip abduction isometric 3" hold x2 minutes    Marching  Strengthening;Left   2 minutes     Shoulder Exercises: Seated   Row  AROM;Left   x2 minutes   Other Seated Exercises  towel squeeze  to improve fine motor tasks x2 mins shoulder rolls    Other Seated Exercises  right shoulder adduction with isometric hold  until fatigue x2      Shoulder Exercises: ROM/Strengthening   Other ROM/Strengthening Exercises  shoulder rolls left sided until fatigue x2  PT Long Term Goals - 07/21/18 1619      PT LONG TERM GOAL #1   Title  Patient will be independent with HEP    Time  4    Period  Weeks    Status  New      PT LONG TERM GOAL #2   Title  Patient will transfer with supervision from wheelchair to plinth with slideboard    Time  4    Period  Weeks    Status  New      PT LONG TERM GOAL #3   Title  Patient will demonstrate sit to stand transfer with min A to close supervision assist.    Time  4    Period  Weeks    Status  New      PT LONG TERM GOAL #4   Title  Patient will demonstrate standing tolerance to 2 minutes to perform functional tasks for ADLs.    Time  4    Period  Weeks    Status  New      PT LONG TERM GOAL #5   Title  Patient will demonstrate 4+/5 or greater in left unaffected LE and UE to improve ability to perform functional tasks.    Time  4    Period  Weeks    Status  New      Additional Long Term Goals   Additional Long Term Goals  Yes      PT LONG TERM GOAL #6   Title  Patient will ambulate 40 feet or greater with min A and use of hemi walker to safely navigate around home.    Time  4    Period  Weeks    Status  New            Plan - 07/25/18 1433    Clinical Impression Statement  Patient presented to physical therapy with her granddaughter with bright spirits despite right shoulder pain. Patient tolerated left LE and UE strengthening well and was able to peform to fatigue with each exercise. Patient noted with slight muscle activation with muscle tapping and AAROM into right knee extension. Patient unable to perform AROM ER of right shoulder but was able to demonstrate strong contractions into right shoulder  adduction/IR. Patient's granddaughter was educated on proper donning and doffing of mesh sling and was instructed to used especially during transfers so patient would solely be focused on the transfer and not on position of flaccid arm. Patient reported understanding. Patient's granddaughter also educated on FES and was educated she is out side the timeframe for FES to prevent right shoulder subluxation. Patient and granddaughter reported understanding.     Personal Factors and Comorbidities  Age;Comorbidity 3+    Examination-Activity Limitations  Lift;Dressing;Self Feeding;Transfers;Toileting;Stand;Locomotion Level;Reach Overhead    Stability/Clinical Decision Making  Stable/Uncomplicated    Clinical Decision Making  Moderate    Rehab Potential  Fair    PT Frequency  3x / week    PT Duration  4 weeks    PT Treatment/Interventions  ADLs/Self Care Home Management;Electrical Stimulation;Gait training;Stair training;Functional mobility training;Balance training;Therapeutic exercise;Therapeutic activities;Neuromuscular re-education;Wheelchair mobility training;Manual techniques;Passive range of motion;Patient/family education;DME Instruction;Energy conservation    PT Next Visit Plan  Left side LE and UE strengthening, transfers, standing tolerance and weight shifting, core stabilization, nustep if able    PT Home Exercise Plan  see patient education section    Consulted and Agree with Plan of Care  Patient  Patient will benefit from skilled therapeutic intervention in order to improve the following deficits and impairments:  Pain, Postural dysfunction, Impaired tone, Decreased mobility, Decreased activity tolerance, Decreased range of motion, Decreased strength, Impaired UE functional use, Difficulty walking  Visit Diagnosis: Muscle weakness (generalized)  Chronic right shoulder pain  Difficulty in walking, not elsewhere classified     Problem List Patient Active Problem List    Diagnosis Date Noted  . Physical deconditioning 12/19/2017  . Chronic kidney disease (CKD), stage III (moderate) (HCC)   . Hypoalbuminemia due to protein-calorie malnutrition ([place])   . Intractable migraine without status migrainosus   . Reactive depression   . Hemiparesis affecting right side as late effect of stroke ([place])   . Slow transit constipation   . PAF (paroxysmal atrial fibrillation) ([place])   . Dysphasia, post-stroke   . COPD (chronic obstructive pulmonary disease) ([place]) 10/29/2017  . Chronic diastolic CHF (congestive heart failure) ([place]) 10/29/2017  . Hypothyroidism 10/29/2017  . Essential hypertension 10/29/2017  . GERD (gastroesophageal reflux disease) 10/29/2017  . Anxiety 10/29/2017  . 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 Hyperlipidemia 04/20/2013   Gabriela Eves, PT, DPT 07/25/2018, 2:41 PM  [facility] [place], [place], [address] Phone: [phone]   Fax:  [phone]  Name: Holly Flores MRN: [id number] Date of Birth: [birth year]

## 2018-07-27 ENCOUNTER — Encounter: Payer: Self-pay | Admitting: Physical Therapy

## 2018-07-27 ENCOUNTER — Ambulatory Visit: Payer: Medicare Other | Admitting: Physical Therapy

## 2018-07-27 ENCOUNTER — Other Ambulatory Visit: Payer: Self-pay

## 2018-07-27 DIAGNOSIS — M25511 Pain in right shoulder: Secondary | ICD-10-CM

## 2018-07-27 DIAGNOSIS — G8929 Other chronic pain: Secondary | ICD-10-CM

## 2018-07-27 DIAGNOSIS — M6281 Muscle weakness (generalized): Secondary | ICD-10-CM

## 2018-07-27 DIAGNOSIS — R262 Difficulty in walking, not elsewhere classified: Secondary | ICD-10-CM

## 2018-07-27 NOTE — Therapy (Signed)
[facility] [place], [place], [address] Phone: [phone]   Fax:  [phone]  Physical Therapy Treatment  Patient Details  Name: Holly Flores MRN: [id number] Date of Birth: Dec 21, 1945 Referring Provider (PT): Octavio Graves, DO   Encounter Date: 07/27/2018  PT End of Session - 07/27/18 1301    Visit Number  3    Number of Visits  12    Date for PT Re-Evaluation  09/08/18    Authorization Type  Progress note every 10th visit; KX modifier at 15th visit.    PT Start Time  1301    PT Stop Time  1342    PT Time Calculation (min)  41 min    Equipment Utilized During Treatment  Other (comment)   Wheelchair   Activity Tolerance  Patient limited by fatigue    Behavior During Therapy  WFL for tasks assessed/performed       Past Medical History:  Diagnosis Date  . Anxiety   . Arthritis    "all over" (12/20/2017)  . Central retinal vein occlusion of left eye 01/22/2015  . CHF (congestive heart failure) ([place])   . Chronic bronchitis ([place])    "get it most years" (12/20/2017)  . Chronic lower back pain   . CKD (chronic kidney disease), stage II   . COPD (chronic obstructive pulmonary disease) ([place])   . CVA (cerebral vascular accident) ([place]) 12/30/2014   OCULAR  LEFT  EYE   . Depression   . Fibromyalgia   . GERD (gastroesophageal reflux disease)   . Goiter   . History of colonic polyps 05/14/2014  . Hyperlipidemia   . Hypertension   . Hypothyroidism   . Kidney cysts    left side  . 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 Migraine    "couple times/wk; been having them for a long time" (12/20/2017)  . Myocardial infarction [facility])    "one dr said I did; one said I didn't;  don't remember when it was" (12/20/2017)  . 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 On home oxygen therapy    "3L; sleep w/it q night" (12/20/2017)  . Pneumonia    "several times" (12/20/2017)  . Shortness of breath    . Sleep apnea    "stopped mask when I got on the oxygen" (12/20/2017)  . Stroke [facility])     Past Surgical History:  Procedure Laterality Date  . ANTERIOR CERVICAL DECOMP/DISCECTOMY FUSION    . BACK SURGERY    . BREAST BIOPSY     left-non cancerous  . CARDIAC CATHETERIZATION    . CARPAL TUNNEL RELEASE Left   . CATARACT EXTRACTION W/PHACO  09/21/2011   Procedure: CATARACT EXTRACTION PHACO AND INTRAOCULAR LENS PLACEMENT (IOC);  Surgeon: Williams Che, MD;  Location: [facility];  Service: Ophthalmology;  Laterality: Left;  CDE:9.78  . CATARACT EXTRACTION W/PHACO Right 10/15/2014   Procedure: CATARACT EXTRACTION PHACO AND INTRAOCULAR LENS PLACEMENT ([place]);  Surgeon: Williams Che, MD;  Location: [facility];  Service: Ophthalmology;  Laterality: Right;  CDE:4.19  . [place]; 1972  . CHOLECYSTECTOMY OPEN    . COLONOSCOPY  2006   RMR: 1. Internal hemorroids, otherwise normal rectum. 2. Pedunculated polyp at 35 cm. reomved with snare. The remainder of teh colonic mucosa appeared normal.   . COLONOSCOPY  2011   Dr. Gala Romney: multiple ascending colon polyps and rectal polyp, adenomatous  . COLONOSCOPY N/A 05/31/2014   Procedure:  COLONOSCOPY;  Surgeon: Daneil Dolin, MD;  [facility];  Service: Endoscopy;  Laterality: N/A;  130  . COLONOSCOPY WITH PROPOFOL N/A 06/24/2017   Procedure: COLONOSCOPY WITH PROPOFOL;  Surgeon: Daneil Dolin, MD;  [facility];  Service: Endoscopy;  Laterality: N/A;  2:15pm  . ESOPHAGOGASTRODUODENOSCOPY  2006   Dr. Gala Romney: Subtle Schatzkis ring, otherwise normal upper GI tract, aside from a small pyloric channell erosion, status post dilation as described above.   Marland Kitchen FLEXIBLE SIGMOIDOSCOPY N/A 11/11/2017   Procedure: FLEXIBLE SIGMOIDOSCOPY;  Surgeon: Otis Brace, MD;  Location: [place];  Service: Gastroenterology;  Laterality: N/A;  . JOINT REPLACEMENT    . LUMBAR DISC SURGERY  X 2  . POLYPECTOMY  06/24/2017   Procedure: POLYPECTOMY;   Surgeon: Daneil Dolin, MD;  [facility];  Service: Endoscopy;;  ascending  . POSTERIOR LUMBAR FUSION  ? date 1st fusion ; 07/2006   previous L4-5 Ray threaded fusion cage; Exploration of L4-5 fusion & PLIF 07/22/2006/notes 07/22/2006  . TONSILLECTOMY    . TOTAL KNEE ARTHROPLASTY Right 05/24/2013   Procedure: TOTAL KNEE ARTHROPLASTY;  Surgeon: [name], MD;  Location: [place];  Service: Orthopedics;  Laterality: Right;  . TOTAL KNEE ARTHROPLASTY Left   . VAGINAL HYSTERECTOMY      There were no vitals filed for this visit.  Subjective Assessment - 07/27/18 1300    Subjective  COVID 19 screening performed on patient prior to entering the building. Patient reports that her R shoulder is hurting a little bit but not much.    Patient is accompained by:  Family member   Daughter   Pertinent History  left sided CVA 10/29/17, Chronic CHF, AFib, COPD, Chronic Kidney Disease stage III, hx of bil TKA, Lumbar surgery    Diagnostic tests  MRI: left external capsule CVA    Patient Stated Goals  move around better in the home and improve function    Currently in Pain?  Yes    Pain Score  5     Pain Location  Shoulder    Pain Orientation  Right    Pain Descriptors / Indicators  Sore    Pain Type  Chronic pain    Pain Onset  More than a month ago    Pain Frequency  Constant         [facility] PT Assessment - 07/27/18 0001      Assessment   Medical Diagnosis  Left sided CVA    Referring Provider (PT)  Octavio Graves, DO    Onset Date/Surgical Date  10/29/17    Hand Dominance  Right    Next MD Visit  n/a    Prior Therapy  yes, inpatient      Precautions   Precautions  Bernerd Limbo Adult PT Treatment/Exercise - 07/27/18 0001      Exercises   Exercises  Shoulder;Lumbar;Knee/Hip      Lumbar Exercises: Seated   Other Seated Lumbar Exercises  Core drawin x10 reps 5 sec hold    Other Seated Lumbar Exercises  Core rocking x15 reps      Knee/Hip  Exercises: Seated   Long Arc Quad  AROM;AAROM;Both   to fatigue for BLE   Long Arc Quad Limitations  tapping required along with max assist for RLE    Heel Slides  AAROM;Both   to fatigue  Heel Slides Limitations  tapping to quad and HS to RLE     Ball Squeeze  to fatigue with pillow    Other Seated Knee/Hip Exercises  isolated hip abduction isometric x fatigue with tapping for R hip    Marching  AROM;AAROM;Both   to fatigue; tapping and max assist required for RLE     Shoulder Exercises: Seated   External Rotation  AAROM;Right   to fatigue; max assist from PTA; painfree range   Flexion  AAROM;Right   to fatigue; max assist from PTA; painfree range   Diagonals  AAROM;Right   to fatigue; max assist from PTA; D1 and D2   Other Seated Exercises  shoulder adduction isometric to fatigue                  PT Long Term Goals - 07/21/18 1619      PT LONG TERM GOAL #1   Title  Patient will be independent with HEP    Time  4    Period  Weeks    Status  New      PT LONG TERM GOAL #2   Title  Patient will transfer with supervision from wheelchair to plinth with slideboard    Time  4    Period  Weeks    Status  New      PT LONG TERM GOAL #3   Title  Patient will demonstrate sit to stand transfer with min A to close supervision assist.    Time  4    Period  Weeks    Status  New      PT LONG TERM GOAL #4   Title  Patient will demonstrate standing tolerance to 2 minutes to perform functional tasks for ADLs.    Time  4    Period  Weeks    Status  New      PT LONG TERM GOAL #5   Title  Patient will demonstrate 4+/5 or greater in left unaffected LE and UE to improve ability to perform functional tasks.    Time  4    Period  Weeks    Status  New      Additional Long Term Goals   Additional Long Term Goals  Yes      PT LONG TERM GOAL #6   Title  Patient will ambulate 40 feet or greater with min A and use of hemi walker to safely navigate around home.    Time  4     Period  Weeks    Status  New            Plan - 07/27/18 1409    Clinical Impression Statement  Patient presented in clinic with her daughter with reports of "some" pain although daughter reports that she has had some soreness in R shoulder. Patient remains flaccid in RLE and RUE. Tapping utilized during therex on RLE to facilitate muscle activation. Max assist was required by PTA during all therex to RUE and RLE. Swelling noted in R ankle minimally and moderately in R hand. Patient and daughter educated regarding rationale of core strengthening along with tapping with therex. Patient appeared more fatigued today during therex. Patient and daughter also educated regarding different approaches to PT and possible transfer to neuro facility in [place].    Personal Factors and Comorbidities  Age;Comorbidity 3+    Examination-Activity Limitations  Lift;Dressing;Self Feeding;Transfers;Toileting;Stand;Locomotion Level;Reach Overhead    Stability/Clinical Decision Making  Stable/Uncomplicated    Rehab Potential  Fair    PT Frequency  3x / week    PT Duration  4 weeks    PT Treatment/Interventions  ADLs/Self Care Home Management;Electrical Stimulation;Gait training;Stair training;Functional mobility training;Balance training;Therapeutic exercise;Therapeutic activities;Neuromuscular re-education;Wheelchair mobility training;Manual techniques;Passive range of motion;Patient/family education;DME Instruction;Energy conservation    PT Next Visit Plan  Left side LE and UE strengthening, transfers, standing tolerance and weight shifting, core stabilization, nustep if able    PT Home Exercise Plan  see patient education section    Consulted and Agree with Plan of Care  Patient       Patient will benefit from skilled therapeutic intervention in order to improve the following deficits and impairments:  Pain, Postural dysfunction, Impaired tone, Decreased mobility, Decreased activity tolerance, Decreased  range of motion, Decreased strength, Impaired UE functional use, Difficulty walking  Visit Diagnosis: Muscle weakness (generalized)  Chronic right shoulder pain  Difficulty in walking, not elsewhere classified     Problem List Patient Active Problem List   Diagnosis Date Noted  . Physical deconditioning 12/19/2017  . Chronic kidney disease (CKD), stage III (moderate) (HCC)   . Hypoalbuminemia due to protein-calorie malnutrition ([place])   . Intractable migraine without status migrainosus   . Reactive depression   . Hemiparesis affecting right side as late effect of stroke ([place])   . Slow transit constipation   . PAF (paroxysmal atrial fibrillation) ([place])   . Dysphasia, post-stroke   . COPD (chronic obstructive pulmonary disease) ([place]) 10/29/2017  . Chronic diastolic CHF (congestive heart failure) ([place]) 10/29/2017  . Hypothyroidism 10/29/2017  . Essential hypertension 10/29/2017  . GERD (gastroesophageal reflux disease) 10/29/2017  . Anxiety 10/29/2017  . 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 Hyperlipidemia 04/20/2013    Standley Brooking, PTA 07/27/2018, 2:15 PM  [facility] [place], [place], [address] Phone: [phone]   Fax:  [phone]  Name: JUSTUS DUERR MRN: [id number] Date of Birth: 1945/06/06

## 2018-07-29 ENCOUNTER — Ambulatory Visit: Payer: Medicare Other | Admitting: Physical Therapy

## 2018-07-29 ENCOUNTER — Encounter: Payer: Self-pay | Admitting: Physical Therapy

## 2018-07-29 ENCOUNTER — Other Ambulatory Visit: Payer: Self-pay

## 2018-07-29 DIAGNOSIS — G8929 Other chronic pain: Secondary | ICD-10-CM

## 2018-07-29 DIAGNOSIS — R262 Difficulty in walking, not elsewhere classified: Secondary | ICD-10-CM

## 2018-07-29 DIAGNOSIS — M6281 Muscle weakness (generalized): Secondary | ICD-10-CM | POA: Diagnosis not present

## 2018-07-29 NOTE — Therapy (Signed)
[facility] [place], [place], [address] Phone: [phone]   Fax:  [phone]  Physical Therapy Treatment  Patient Details  Name: Holly Flores MRN: [id number] Date of Birth: 1945-10-02 Referring Provider (PT): Octavio Graves, DO   Encounter Date: 07/29/2018  PT End of Session - 07/29/18 1114    Visit Number  4    Number of Visits  12    Date for PT Re-Evaluation  09/08/18    Authorization Type  Progress note every 10th visit; KX modifier at 15th visit.    PT Start Time  1115    PT Stop Time  1202    PT Time Calculation (min)  47 min    Equipment Utilized During Treatment  Other (comment)   wheelchair   Activity Tolerance  Patient limited by fatigue    Behavior During Therapy  [facility] for tasks assessed/performed       Past Medical History:  Diagnosis Date  . Anxiety   . Arthritis    "all over" (12/20/2017)  . Central retinal vein occlusion of left eye 01/22/2015  . CHF (congestive heart failure) ([place])   . Chronic bronchitis ([place])    "get it most years" (12/20/2017)  . Chronic lower back pain   . CKD (chronic kidney disease), stage II   . COPD (chronic obstructive pulmonary disease) ([place])   . CVA (cerebral vascular accident) ([place]) 12/30/2014   OCULAR  LEFT  EYE   . Depression   . Fibromyalgia   . GERD (gastroesophageal reflux disease)   . Goiter   . History of colonic polyps 05/14/2014  . Hyperlipidemia   . Hypertension   . Hypothyroidism   . Kidney cysts    left side  . 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 Migraine    "couple times/wk; been having them for a long time" (12/20/2017)  . Myocardial infarction [facility])    "one dr said I did; one said I didn't;  don't remember when it was" (12/20/2017)  . 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 On home oxygen therapy    "3L; sleep w/it q night" (12/20/2017)  . Pneumonia    "several times" (12/20/2017)  . Shortness of breath    . Sleep apnea    "stopped mask when I got on the oxygen" (12/20/2017)  . Stroke [facility])     Past Surgical History:  Procedure Laterality Date  . ANTERIOR CERVICAL DECOMP/DISCECTOMY FUSION    . BACK SURGERY    . BREAST BIOPSY     left-non cancerous  . CARDIAC CATHETERIZATION    . CARPAL TUNNEL RELEASE Left   . CATARACT EXTRACTION W/PHACO  09/21/2011   Procedure: CATARACT EXTRACTION PHACO AND INTRAOCULAR LENS PLACEMENT (IOC);  Surgeon: Williams Che, MD;  Location: [facility];  Service: Ophthalmology;  Laterality: Left;  CDE:9.78  . CATARACT EXTRACTION W/PHACO Right 10/15/2014   Procedure: CATARACT EXTRACTION PHACO AND INTRAOCULAR LENS PLACEMENT ([place]);  Surgeon: Williams Che, MD;  Location: [facility];  Service: Ophthalmology;  Laterality: Right;  CDE:4.19  . [place]; 1972  . CHOLECYSTECTOMY OPEN    . COLONOSCOPY  2006   RMR: 1. Internal hemorroids, otherwise normal rectum. 2. Pedunculated polyp at 35 cm. reomved with snare. The remainder of teh colonic mucosa appeared normal.   . COLONOSCOPY  2011   Dr. Gala Romney: multiple ascending colon polyps and rectal polyp, adenomatous  . COLONOSCOPY N/A 05/31/2014   Procedure:  COLONOSCOPY;  Surgeon: Daneil Dolin, MD;  [facility];  Service: Endoscopy;  Laterality: N/A;  130  . COLONOSCOPY WITH PROPOFOL N/A 06/24/2017   Procedure: COLONOSCOPY WITH PROPOFOL;  Surgeon: Daneil Dolin, MD;  [facility];  Service: Endoscopy;  Laterality: N/A;  2:15pm  . ESOPHAGOGASTRODUODENOSCOPY  2006   Dr. Gala Romney: Subtle Schatzkis ring, otherwise normal upper GI tract, aside from a small pyloric channell erosion, status post dilation as described above.   Marland Kitchen FLEXIBLE SIGMOIDOSCOPY N/A 11/11/2017   Procedure: FLEXIBLE SIGMOIDOSCOPY;  Surgeon: Otis Brace, MD;  Location: [place];  Service: Gastroenterology;  Laterality: N/A;  . JOINT REPLACEMENT    . LUMBAR DISC SURGERY  X 2  . POLYPECTOMY  06/24/2017   Procedure: POLYPECTOMY;   Surgeon: Daneil Dolin, MD;  [facility];  Service: Endoscopy;;  ascending  . POSTERIOR LUMBAR FUSION  ? date 1st fusion ; 07/2006   previous L4-5 Ray threaded fusion cage; Exploration of L4-5 fusion & PLIF 07/22/2006/notes 07/22/2006  . TONSILLECTOMY    . TOTAL KNEE ARTHROPLASTY Right 05/24/2013   Procedure: TOTAL KNEE ARTHROPLASTY;  Surgeon: [name], MD;  Location: [place];  Service: Orthopedics;  Laterality: Right;  . TOTAL KNEE ARTHROPLASTY Left   . VAGINAL HYSTERECTOMY      There were no vitals filed for this visit.  Subjective Assessment - 07/29/18 1231    Subjective  COVID 19 screening performed on patient prior to entering the building. Patient reports that she noticed more swelling in her right hand and right ankle today.    Patient is accompained by:  Family member   Son, Lennette Bihari   Pertinent History  left sided CVA 10/29/17, Chronic CHF, AFib, COPD, Chronic Kidney Disease stage III, hx of bil TKA, Lumbar surgery    Limitations  Lifting;Standing;Walking;House hold activities    Diagnostic tests  MRI: left external capsule CVA    Patient Stated Goals  move around better in the home and improve function    Currently in Pain?  Yes    Pain Score  5     Pain Location  Shoulder    Pain Descriptors / Indicators  Sore    Pain Type  Chronic pain    Pain Onset  More than a month ago         [facility] PT Assessment - 07/29/18 0001      Assessment   Medical Diagnosis  Left sided CVA    Referring Provider (PT)  Octavio Graves, DO    Onset Date/Surgical Date  10/29/17    Hand Dominance  Right    Next MD Visit  n/a    Prior Therapy  yes, inpatient                   [facility] Adult PT Treatment/Exercise - 07/29/18 0001      Exercises   Exercises  Shoulder;Lumbar;Knee/Hip;Hand      Lumbar Exercises: Seated   Other Seated Lumbar Exercises  seated left hand reaching 3x to fatigue to improve core stability no back support    Other Seated Lumbar Exercises  Core  rocking x15 reps      Knee/Hip Exercises: Seated   Long Arc Quad  AROM;AAROM;Both   to fatigue   Long Arc Quad Limitations  tapping required along with max assist for RLE    Cardinal Health  to fatigue with pillow    Other Seated Knee/Hip Exercises  isolated hip abduction isometric x  fatigue with tapping for R hip    Marching  AROM;AAROM;Both   to fatigue; Max Assist to right LE     Shoulder Exercises: Seated   Flexion  AAROM;Right    Flexion Limitations  max assist from PT    Diagonals  AAROM;Right    Diagonals Limitations  with max assist from PT     Other Seated Exercises  shoulder adduction isometric to fatigue    Other Seated Exercises  left active bicep curl, AAROM of right. to fatigue                  PT Long Term Goals - 07/21/18 1619      PT LONG TERM GOAL #1   Title  Patient will be independent with HEP    Time  4    Period  Weeks    Status  New      PT LONG TERM GOAL #2   Title  Patient will transfer with supervision from wheelchair to plinth with slideboard    Time  4    Period  Weeks    Status  New      PT LONG TERM GOAL #3   Title  Patient will demonstrate sit to stand transfer with min A to close supervision assist.    Time  4    Period  Weeks    Status  New      PT LONG TERM GOAL #4   Title  Patient will demonstrate standing tolerance to 2 minutes to perform functional tasks for ADLs.    Time  4    Period  Weeks    Status  New      PT LONG TERM GOAL #5   Title  Patient will demonstrate 4+/5 or greater in left unaffected LE and UE to improve ability to perform functional tasks.    Time  4    Period  Weeks    Status  New      Additional Long Term Goals   Additional Long Term Goals  Yes      PT LONG TERM GOAL #6   Title  Patient will ambulate 40 feet or greater with min A and use of hemi walker to safely navigate around home.    Time  4    Period  Weeks    Status  New            Plan - 07/29/18 1224    Clinical Impression  Statement  Patient was able to tolerate treatment fairly well but noted with ongoing soreness in right shoulder. Patient noted with a 2 finger subluxation of right shoulder. Patient's right arm was positioned to support right arm on arm rest. Patient was unable to demonstrate right shoulder adduction today and stated she felt more fatiuged. Patient noted with decreased core stability during inside base of support reaching with unsupported back. Patient instructed to continue HEP to address deficits.    Personal Factors and Comorbidities  Age;Comorbidity 3+    Examination-Activity Limitations  Lift;Dressing;Self Feeding;Transfers;Toileting;Stand;Locomotion Level;Reach Overhead    Stability/Clinical Decision Making  Stable/Uncomplicated    Clinical Decision Making  Moderate    Rehab Potential  Fair    PT Frequency  3x / week    PT Duration  4 weeks    PT Treatment/Interventions  ADLs/Self Care Home Management;Electrical Stimulation;Gait training;Stair training;Functional mobility training;Balance training;Therapeutic exercise;Therapeutic activities;Neuromuscular re-education;Wheelchair mobility training;Manual techniques;Passive range of motion;Patient/family education;DME Instruction;Energy conservation    PT Next Visit Plan  Left side LE and UE strengthening, transfers, standing tolerance and weight shifting, core stabilization, nustep if able    PT Home Exercise Plan  see patient education section    Consulted and Agree with Plan of Care  Patient       Patient will benefit from skilled therapeutic intervention in order to improve the following deficits and impairments:  Pain, Postural dysfunction, Impaired tone, Decreased mobility, Decreased activity tolerance, Decreased range of motion, Decreased strength, Impaired UE functional use, Difficulty walking  Visit Diagnosis: Muscle weakness (generalized)  Chronic right shoulder pain  Difficulty in walking, not elsewhere classified     Problem  List Patient Active Problem List   Diagnosis Date Noted  . Physical deconditioning 12/19/2017  . Chronic kidney disease (CKD), stage III (moderate) (HCC)   . Hypoalbuminemia due to protein-calorie malnutrition ([place])   . Intractable migraine without status migrainosus   . Reactive depression   . Hemiparesis affecting right side as late effect of stroke ([place])   . Slow transit constipation   . PAF (paroxysmal atrial fibrillation) ([place])   . Dysphasia, post-stroke   . COPD (chronic obstructive pulmonary disease) ([place]) 10/29/2017  . Chronic diastolic CHF (congestive heart failure) ([place]) 10/29/2017  . Hypothyroidism 10/29/2017  . Essential hypertension 10/29/2017  . GERD (gastroesophageal reflux disease) 10/29/2017  . Anxiety 10/29/2017  . 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 Hyperlipidemia 04/20/2013   Gabriela Eves, PT, DPT 07/29/2018, 12:35 PM  [facility] [place], [place], [address] Phone: [phone]   Fax:  [phone]  Name: Holly Flores MRN: [id number] Date of Birth: [birth year]

## 2018-08-01 ENCOUNTER — Ambulatory Visit: Payer: Medicare Other | Admitting: Physical Therapy

## 2018-08-01 ENCOUNTER — Encounter: Payer: Self-pay | Admitting: Physical Therapy

## 2018-08-01 ENCOUNTER — Other Ambulatory Visit: Payer: Self-pay

## 2018-08-01 DIAGNOSIS — M6281 Muscle weakness (generalized): Secondary | ICD-10-CM | POA: Diagnosis not present

## 2018-08-01 DIAGNOSIS — R262 Difficulty in walking, not elsewhere classified: Secondary | ICD-10-CM

## 2018-08-01 DIAGNOSIS — G8929 Other chronic pain: Secondary | ICD-10-CM

## 2018-08-01 NOTE — Therapy (Signed)
[facility] [place], [place], [address] Phone: [phone]   Fax:  [phone]  Physical Therapy Treatment  Patient Details  Name: Holly Flores MRN: [id number] Date of Birth: 1945-05-02 Referring Provider (PT): Octavio Graves, DO   Encounter Date: 08/01/2018  PT End of Session - 08/01/18 1153    Visit Number  5    Number of Visits  12    Date for PT Re-Evaluation  09/08/18    Authorization Type  Progress note every 10th visit; KX modifier at 15th visit.    PT Start Time  1115    PT Stop Time  1203    PT Time Calculation (min)  48 min    Equipment Utilized During Treatment  Other (comment);Gait belt   hemi-walker   Activity Tolerance  Patient limited by fatigue    Behavior During Therapy  WFL for tasks assessed/performed       Past Medical History:  Diagnosis Date  . Anxiety   . Arthritis    "all over" (12/20/2017)  . Central retinal vein occlusion of left eye 01/22/2015  . CHF (congestive heart failure) ([place])   . Chronic bronchitis ([place])    "get it most years" (12/20/2017)  . Chronic lower back pain   . CKD (chronic kidney disease), stage II   . COPD (chronic obstructive pulmonary disease) ([place])   . CVA (cerebral vascular accident) ([place]) 12/30/2014   OCULAR  LEFT  EYE   . Depression   . Fibromyalgia   . GERD (gastroesophageal reflux disease)   . Goiter   . History of colonic polyps 05/14/2014  . Hyperlipidemia   . Hypertension   . Hypothyroidism   . Kidney cysts    left side  . 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 Migraine    "couple times/wk; been having them for a long time" (12/20/2017)  . Myocardial infarction [facility])    "one dr said I did; one said I didn't;  don't remember when it was" (12/20/2017)  . 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 On home oxygen therapy    "3L; sleep w/it q night" (12/20/2017)  . Pneumonia    "several times" (12/20/2017)  .  Shortness of breath   . Sleep apnea    "stopped mask when I got on the oxygen" (12/20/2017)  . Stroke [facility])     Past Surgical History:  Procedure Laterality Date  . ANTERIOR CERVICAL DECOMP/DISCECTOMY FUSION    . BACK SURGERY    . BREAST BIOPSY     left-non cancerous  . CARDIAC CATHETERIZATION    . CARPAL TUNNEL RELEASE Left   . CATARACT EXTRACTION W/PHACO  09/21/2011   Procedure: CATARACT EXTRACTION PHACO AND INTRAOCULAR LENS PLACEMENT (IOC);  Surgeon: Williams Che, MD;  Location: [facility];  Service: Ophthalmology;  Laterality: Left;  CDE:9.78  . CATARACT EXTRACTION W/PHACO Right 10/15/2014   Procedure: CATARACT EXTRACTION PHACO AND INTRAOCULAR LENS PLACEMENT ([place]);  Surgeon: Williams Che, MD;  Location: [facility];  Service: Ophthalmology;  Laterality: Right;  CDE:4.19  . [place]; 1972  . CHOLECYSTECTOMY OPEN    . COLONOSCOPY  2006   RMR: 1. Internal hemorroids, otherwise normal rectum. 2. Pedunculated polyp at 35 cm. reomved with snare. The remainder of teh colonic mucosa appeared normal.   . COLONOSCOPY  2011   Dr. Gala Romney: multiple ascending colon polyps and rectal polyp, adenomatous  . COLONOSCOPY N/A 05/31/2014  Procedure: COLONOSCOPY;  Surgeon: Daneil Dolin, MD;  [facility];  Service: Endoscopy;  Laterality: N/A;  130  . COLONOSCOPY WITH PROPOFOL N/A 06/24/2017   Procedure: COLONOSCOPY WITH PROPOFOL;  Surgeon: Daneil Dolin, MD;  [facility];  Service: Endoscopy;  Laterality: N/A;  2:15pm  . ESOPHAGOGASTRODUODENOSCOPY  2006   Dr. Gala Romney: Subtle Schatzkis ring, otherwise normal upper GI tract, aside from a small pyloric channell erosion, status post dilation as described above.   Marland Kitchen FLEXIBLE SIGMOIDOSCOPY N/A 11/11/2017   Procedure: FLEXIBLE SIGMOIDOSCOPY;  Surgeon: Otis Brace, MD;  Location: [place];  Service: Gastroenterology;  Laterality: N/A;  . JOINT REPLACEMENT    . LUMBAR DISC SURGERY  X 2  . POLYPECTOMY  06/24/2017    Procedure: POLYPECTOMY;  Surgeon: Daneil Dolin, MD;  [facility];  Service: Endoscopy;;  ascending  . POSTERIOR LUMBAR FUSION  ? date 1st fusion ; 07/2006   previous L4-5 Ray threaded fusion cage; Exploration of L4-5 fusion & PLIF 07/22/2006/notes 07/22/2006  . TONSILLECTOMY    . TOTAL KNEE ARTHROPLASTY Right 05/24/2013   Procedure: TOTAL KNEE ARTHROPLASTY;  Surgeon: [name], MD;  Location: [place];  Service: Orthopedics;  Laterality: Right;  . TOTAL KNEE ARTHROPLASTY Left   . VAGINAL HYSTERECTOMY      There were no vitals filed for this visit.  Subjective Assessment - 08/01/18 1118    Subjective  COVID 19 screening performed on patient prior to entering the building. Patient reports feeling good today.    Patient is accompained by:  Family member   Daughter   Pertinent History  left sided CVA 10/29/17, Chronic CHF, AFib, COPD, Chronic Kidney Disease stage III, hx of bil TKA, Lumbar surgery    Limitations  Lifting;Standing;Walking;House hold activities    Diagnostic tests  MRI: left external capsule CVA    Patient Stated Goals  move around better in the home and improve function    Currently in Pain?  Yes    Pain Score  5     Pain Location  Shoulder    Pain Orientation  Right    Pain Descriptors / Indicators  Sore    Pain Type  Chronic pain    Pain Onset  More than a month ago    Pain Frequency  Constant         [facility] PT Assessment - 08/01/18 0001      Assessment   Medical Diagnosis  Left sided CVA    Referring Provider (PT)  Octavio Graves, DO    Onset Date/Surgical Date  10/29/17    Hand Dominance  Right    Next MD Visit  n/a    Prior Therapy  yes, inpatient                   [facility] Adult PT Treatment/Exercise - 08/01/18 0001      Transfers   Sit to Stand  3: Mod assist;With armrests;With upper extremity assist    Sit to Stand Details  Manual facilitation for weight shifting;Manual facilitation for placement    Sit to Stand Details (indicate  cue type and reason)  knee blocking    Sit to Stand: Patient Percentage  20%    Stand to Sit  3: Mod assist;With armrests    Stand to Sit Details (indicate cue type and reason)  Manual facilitation for weight shifting    Stand to Sit Details  controlled descent provided by PT  Comments  x3      Exercises   Exercises  Shoulder;Lumbar;Knee/Hip;Hand      Lumbar Exercises: Seated   Other Seated Lumbar Exercises  seated left hand reaching 3x to fatigue to improve core stability no back support    Other Seated Lumbar Exercises  Core rocking x15 reps      Knee/Hip Exercises: Seated   Long Arc Quad  AROM;AAROM;Both    Long [employer] Limitations  tapping required along with max assist for RLE    Cardinal Health  to fatigue with pillow    Clamshell with TheraBand  Yellow   left side only to fatigue     Shoulder Exercises: Seated   Other Seated Exercises  left bicep curl yellow theraband  to fatigue followed by tricep extension yellow theraband to fatigue                  PT Long Term Goals - 07/21/18 1619      PT LONG TERM GOAL #1   Title  Patient will be independent with HEP    Time  4    Period  Weeks    Status  New      PT LONG TERM GOAL #2   Title  Patient will transfer with supervision from wheelchair to plinth with slideboard    Time  4    Period  Weeks    Status  New      PT LONG TERM GOAL #3   Title  Patient will demonstrate sit to stand transfer with min A to close supervision assist.    Time  4    Period  Weeks    Status  New      PT LONG TERM GOAL #4   Title  Patient will demonstrate standing tolerance to 2 minutes to perform functional tasks for ADLs.    Time  4    Period  Weeks    Status  New      PT LONG TERM GOAL #5   Title  Patient will demonstrate 4+/5 or greater in left unaffected LE and UE to improve ability to perform functional tasks.    Time  4    Period  Weeks    Status  New      Additional Long Term Goals   Additional Long Term Goals   Yes      PT LONG TERM GOAL #6   Title  Patient will ambulate 40 feet or greater with min A and use of hemi walker to safely navigate around home.    Time  4    Period  Weeks    Status  New            Plan - 08/01/18 1222    Clinical Impression Statement  Patient presents in good spirits with right sling today. Patient was able to tolerate treatment well but with fatigue. Patient was able to perform unilateral seated clams with yellow theraband with fatigue. Patient required mod to max assist for sit to stand. Patient states she feels more confident when performing with children and at home. Patient and PT discussed continuing with functional based exercises. Patient reported understanding.    Personal Factors and Comorbidities  Age;Comorbidity 3+    Examination-Activity Limitations  Lift;Dressing;Self Feeding;Transfers;Toileting;Stand;Locomotion Level;Reach Overhead    Stability/Clinical Decision Making  Stable/Uncomplicated    Clinical Decision Making  Moderate    Rehab Potential  Fair    PT Frequency  3x / week  PT Duration  4 weeks    PT Treatment/Interventions  ADLs/Self Care Home Management;Electrical Stimulation;Gait training;Stair training;Functional mobility training;Balance training;Therapeutic exercise;Therapeutic activities;Neuromuscular re-education;Wheelchair mobility training;Manual techniques;Passive range of motion;Patient/family education;DME Instruction;Energy conservation    PT Next Visit Plan  Left side LE and UE strengthening, transfers, standing tolerance and weight shifting, core stabilization, nustep if able    Consulted and Agree with Plan of Care  Patient       Patient will benefit from skilled therapeutic intervention in order to improve the following deficits and impairments:  Pain, Postural dysfunction, Impaired tone, Decreased mobility, Decreased activity tolerance, Decreased range of motion, Decreased strength, Impaired UE functional use, Difficulty  walking  Visit Diagnosis: Muscle weakness (generalized)  Chronic right shoulder pain  Difficulty in walking, not elsewhere classified     Problem List Patient Active Problem List   Diagnosis Date Noted  . Physical deconditioning 12/19/2017  . Chronic kidney disease (CKD), stage III (moderate) (HCC)   . Hypoalbuminemia due to protein-calorie malnutrition ([place])   . Intractable migraine without status migrainosus   . Reactive depression   . Hemiparesis affecting right side as late effect of stroke ([place])   . Slow transit constipation   . PAF (paroxysmal atrial fibrillation) ([place])   . Dysphasia, post-stroke   . COPD (chronic obstructive pulmonary disease) ([place]) 10/29/2017  . Chronic diastolic CHF (congestive heart failure) ([place]) 10/29/2017  . Hypothyroidism 10/29/2017  . Essential hypertension 10/29/2017  . GERD (gastroesophageal reflux disease) 10/29/2017  . Anxiety 10/29/2017  . 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 Hyperlipidemia 04/20/2013   Gabriela Eves, PT, DPT 08/01/2018, 1:03 PM  [facility] [place], [place], [address] Phone: [phone]   Fax:  [phone]  Name: MARIBETH JILES MRN: [id number] Date of Birth: 12-Oct-1945

## 2018-08-03 ENCOUNTER — Encounter: Payer: Self-pay | Admitting: Physical Therapy

## 2018-08-03 ENCOUNTER — Ambulatory Visit: Payer: Medicare Other | Admitting: Physical Therapy

## 2018-08-03 ENCOUNTER — Other Ambulatory Visit: Payer: Self-pay

## 2018-08-03 DIAGNOSIS — M6281 Muscle weakness (generalized): Secondary | ICD-10-CM | POA: Diagnosis not present

## 2018-08-03 DIAGNOSIS — R262 Difficulty in walking, not elsewhere classified: Secondary | ICD-10-CM

## 2018-08-03 DIAGNOSIS — G8929 Other chronic pain: Secondary | ICD-10-CM

## 2018-08-03 NOTE — Therapy (Signed)
[facility] [place], [place], [address] Phone: [phone]   Fax:  [phone]  Physical Therapy Treatment  Patient Details  Name: Holly Flores MRN: [id number] Date of Birth: March 26, 1945 Referring Provider (PT): Octavio Graves, DO   Encounter Date: 08/03/2018  PT End of Session - 08/03/18 1544    Visit Number  6    Number of Visits  12    Date for PT Re-Evaluation  09/08/18    Authorization Type  Progress note every 10th visit; KX modifier at 15th visit.    PT Start Time  1115    PT Stop Time  1206    PT Time Calculation (min)  51 min    Equipment Utilized During Treatment  Other (comment);Gait belt    Activity Tolerance  Patient limited by fatigue    Behavior During Therapy  WFL for tasks assessed/performed       Past Medical History:  Diagnosis Date  . Anxiety   . Arthritis    "all over" (12/20/2017)  . Central retinal vein occlusion of left eye 01/22/2015  . CHF (congestive heart failure) ([place])   . Chronic bronchitis ([place])    "get it most years" (12/20/2017)  . Chronic lower back pain   . CKD (chronic kidney disease), stage II   . COPD (chronic obstructive pulmonary disease) ([place])   . CVA (cerebral vascular accident) ([place]) 12/30/2014   OCULAR  LEFT  EYE   . Depression   . Fibromyalgia   . GERD (gastroesophageal reflux disease)   . Goiter   . History of colonic polyps 05/14/2014  . Hyperlipidemia   . Hypertension   . Hypothyroidism   . Kidney cysts    left side  . 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 Migraine    "couple times/wk; been having them for a long time" (12/20/2017)  . Myocardial infarction [facility])    "one dr said I did; one said I didn't;  don't remember when it was" (12/20/2017)  . 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 On home oxygen therapy    "3L; sleep w/it q night" (12/20/2017)  . Pneumonia    "several times" (12/20/2017)  . Shortness of breath    . Sleep apnea    "stopped mask when I got on the oxygen" (12/20/2017)  . Stroke [facility])     Past Surgical History:  Procedure Laterality Date  . ANTERIOR CERVICAL DECOMP/DISCECTOMY FUSION    . BACK SURGERY    . BREAST BIOPSY     left-non cancerous  . CARDIAC CATHETERIZATION    . CARPAL TUNNEL RELEASE Left   . CATARACT EXTRACTION W/PHACO  09/21/2011   Procedure: CATARACT EXTRACTION PHACO AND INTRAOCULAR LENS PLACEMENT (IOC);  Surgeon: Williams Che, MD;  Location: [facility];  Service: Ophthalmology;  Laterality: Left;  CDE:9.78  . CATARACT EXTRACTION W/PHACO Right 10/15/2014   Procedure: CATARACT EXTRACTION PHACO AND INTRAOCULAR LENS PLACEMENT ([place]);  Surgeon: Williams Che, MD;  Location: [facility];  Service: Ophthalmology;  Laterality: Right;  CDE:4.19  . [place]; 1972  . CHOLECYSTECTOMY OPEN    . COLONOSCOPY  2006   RMR: 1. Internal hemorroids, otherwise normal rectum. 2. Pedunculated polyp at 35 cm. reomved with snare. The remainder of teh colonic mucosa appeared normal.   . COLONOSCOPY  2011   Dr. Gala Romney: multiple ascending colon polyps and rectal polyp, adenomatous  . COLONOSCOPY N/A 05/31/2014   Procedure: COLONOSCOPY;  Surgeon: Daneil Dolin, MD;  Location: [facility];  Service: Endoscopy;  Laterality: N/A;  130  . COLONOSCOPY WITH PROPOFOL N/A 06/24/2017   Procedure: COLONOSCOPY WITH PROPOFOL;  Surgeon: Daneil Dolin, MD;  [facility];  Service: Endoscopy;  Laterality: N/A;  2:15pm  . ESOPHAGOGASTRODUODENOSCOPY  2006   Dr. Gala Romney: Subtle Schatzkis ring, otherwise normal upper GI tract, aside from a small pyloric channell erosion, status post dilation as described above.   Marland Kitchen FLEXIBLE SIGMOIDOSCOPY N/A 11/11/2017   Procedure: FLEXIBLE SIGMOIDOSCOPY;  Surgeon: Otis Brace, MD;  Location: [facility];  Service: Gastroenterology;  Laterality: N/A;  . JOINT REPLACEMENT    . LUMBAR DISC SURGERY  X 2  . POLYPECTOMY  06/24/2017   Procedure: POLYPECTOMY;   Surgeon: Daneil Dolin, MD;  Location: [facility];  Service: Endoscopy;;  ascending  . POSTERIOR LUMBAR FUSION  ? date 1st fusion ; 07/2006   previous L4-5 Ray threaded fusion cage; Exploration of L4-5 fusion & PLIF 07/22/2006/notes 07/22/2006  . TONSILLECTOMY    . TOTAL KNEE ARTHROPLASTY Right 05/24/2013   Procedure: TOTAL KNEE ARTHROPLASTY;  Surgeon: [name], MD;  Location: [facility];  Service: Orthopedics;  Laterality: Right;  . TOTAL KNEE ARTHROPLASTY Left   . VAGINAL HYSTERECTOMY      There were no vitals filed for this visit.  Subjective Assessment - 08/03/18 1543    Subjective  COVID 19 screening performed on patient prior to entering the building. Patient reported no new complaints.    Patient is accompained by:  Family member    Pertinent History  left sided CVA 10/29/17, Chronic CHF, AFib, COPD, Chronic Kidney Disease stage III, hx of bil TKA, Lumbar surgery    Limitations  Lifting;Standing;Walking;House hold activities    Diagnostic tests  MRI: left external capsule CVA    Patient Stated Goals  move around better in the home and improve function    Currently in Pain?  Yes    Pain Score  5     Pain Location  Shoulder    Pain Orientation  Right    Pain Descriptors / Indicators  Sore    Pain Type  Chronic pain    Pain Onset  More than a month ago    Pain Frequency  Constant         [facility] PT Assessment - 08/03/18 0001      Assessment   Medical Diagnosis  Left sided CVA    Referring Provider (PT)  Octavio Graves, DO    Onset Date/Surgical Date  10/29/17    Hand Dominance  Right    Next MD Visit  n/a    Prior Therapy  yes, inpatient      Precautions   Precautions  [place] Limbo Adult PT Treatment/Exercise - 08/03/18 0001      Transfers   Sit to Stand  3: Mod assist;With armrests;With upper extremity assist    Sit to Stand Details  Manual facilitation for weight shifting;Manual facilitation for placement    Sit to Stand Details  (indicate cue type and reason)  knee blocking    Sit to Stand: Patient Percentage  20%    Stand to Sit  3: Mod assist;With armrests    Stand to Sit Details (indicate cue type and reason)  Manual facilitation for weight shifting    Stand to Sit Details  c  PT Long Term Goals - 07/21/18 1619      PT LONG TERM GOAL #1   Title  Patient will be independent with HEP    Time  4    Period  Weeks    Status  New      PT LONG TERM GOAL #2   Title  Patient will transfer with supervision from wheelchair to plinth with slideboard    Time  4    Period  Weeks    Status  New      PT LONG TERM GOAL #3   Title  Patient will demonstrate sit to stand transfer with min A to close supervision assist.    Time  4    Period  Weeks    Status  New      PT LONG TERM GOAL #4   Title  Patient will demonstrate standing tolerance to 2 minutes to perform functional tasks for ADLs.    Time  4    Period  Weeks    Status  New      PT LONG TERM GOAL #5   Title  Patient will demonstrate 4+/5 or greater in left unaffected LE and UE to improve ability to perform functional tasks.    Time  4    Period  Weeks    Status  New      Additional Long Term Goals   Additional Long Term Goals  Yes      PT LONG TERM GOAL #6   Title  Patient will ambulate 40 feet or greater with min A and use of hemi walker to safely navigate around home.    Time  4    Period  Weeks    Status  New            Plan - 08/03/18 1544    Clinical Impression Statement  Patient arrived to physical therapy with ongoing soreness in right shoulder. Patient was able to tolerate treatment fairly well and was able to perform all exercises until fatigue. Patient required mod to max assist x1 to stand with knee blocking in order her to sit in a more upright position in her wheelchair. Patient again expressed how she feels better and more confident and comfortable when performed at home and with her family. Patient,  patient's granddaughter, and PT discussed transferring her to [facility] in [place]. Patient's main goal is to stand and improve mobility and discussed neuro rehab has more equipment and space available to practice to attain those goals. All parties were in agreement. Patient's granddaughter had inquired about a motorized wheelchair for home so she can be more mobile to which she was instructed to call her primary physician for a referal. Patient to transfer from [facility] and is to begin PT at neuro rehab on Friday, June 19.    Personal Factors and Comorbidities  Age;Comorbidity 3+    Examination-Activity Limitations  Lift;Dressing;Self Feeding;Transfers;Toileting;Stand;Locomotion Level;Reach Overhead    Stability/Clinical Decision Making  Stable/Uncomplicated    Clinical Decision Making  Moderate    Rehab Potential  Fair    PT Frequency  3x / week    PT Duration  4 weeks    PT Treatment/Interventions  ADLs/Self Care Home Management;Electrical Stimulation;Gait training;Stair training;Functional mobility training;Balance training;Therapeutic exercise;Therapeutic activities;Neuromuscular re-education;Wheelchair mobility training;Manual techniques;Passive range of motion;Patient/family education;DME Instruction;Energy conservation    PT Next Visit Plan  Left side LE and UE strengthening, transfers, standing tolerance and weight shifting, core stabilization, nustep if able  PT Home Exercise Plan  see patient education section    Consulted and Agree with Plan of Care  Patient;Family member/caregiver       Patient will benefit from skilled therapeutic intervention in order to improve the following deficits and impairments:  Pain, Postural dysfunction, Impaired tone, Decreased mobility, Decreased activity tolerance, Decreased range of motion, Decreased strength, Impaired UE functional use, Difficulty walking  Visit Diagnosis: 1. Muscle weakness (generalized)   2. Chronic right shoulder  pain   3. Difficulty in walking, not elsewhere classified        Problem List Patient Active Problem List   Diagnosis Date Noted  . Physical deconditioning 12/19/2017  . Chronic kidney disease (CKD), stage III (moderate) ([facility])   . Hypoalbuminemia due to protein-calorie malnutrition ([place])   . Intractable migraine without status migrainosus   . Reactive depression   . Hemiparesis affecting right side as late effect of stroke ([place])   . Slow transit constipation   . PAF (paroxysmal atrial fibrillation) ([place])   . Dysphasia, post-stroke   . COPD (chronic obstructive pulmonary disease) ([place]) 10/29/2017  . Chronic diastolic CHF (congestive heart failure) ([place]) 10/29/2017  . Hypothyroidism 10/29/2017  . Essential hypertension 10/29/2017  . GERD (gastroesophageal reflux disease) 10/29/2017  . Anxiety 10/29/2017  . 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 Hyperlipidemia 04/20/2013   Gabriela Eves, PT, DPT 08/03/2018, 3:52 PM  [facility] [address] [place], [place], [address] Phone: [phone]   Fax:  [phone]  Name: Holly Flores MRN: [id number] Date of Birth: 18-Apr-1945

## 2018-08-05 ENCOUNTER — Telehealth: Payer: Self-pay | Admitting: Physical Therapy

## 2018-08-05 ENCOUNTER — Ambulatory Visit: Payer: Medicare Other | Admitting: Physical Therapy

## 2018-08-05 ENCOUNTER — Other Ambulatory Visit: Payer: Self-pay

## 2018-08-05 VITALS — BP 141/90 | HR 71

## 2018-08-05 DIAGNOSIS — M6281 Muscle weakness (generalized): Secondary | ICD-10-CM | POA: Diagnosis not present

## 2018-08-05 DIAGNOSIS — G8929 Other chronic pain: Secondary | ICD-10-CM

## 2018-08-05 DIAGNOSIS — R262 Difficulty in walking, not elsewhere classified: Secondary | ICD-10-CM

## 2018-08-05 NOTE — Telephone Encounter (Signed)
Hello Dr. Melina Copa, I also would like to request an order for Speech therapy evaluate and treat as pt is still having some difficulty with phonation.     Thank you, Rico Junker, PT, DPT 08/05/18    3:31 PM

## 2018-08-05 NOTE — Telephone Encounter (Signed)
Hello Dr. Melina Copa, We are currently working with Ms. Tomerlin in physical therapy but she is also reporting significant UE pain and difficulty with ADL.  I feel she would also benefit from Occupational therapy.  If you agree, would you please enter an order in Epic for OT Eval and Treat or fax an OT order to [phone]?  Thank you! Rico Junker, PT, DPT 08/05/18    8:43 AM

## 2018-08-05 NOTE — Therapy (Signed)
[facility] [address] [place], [place], [address] Phone: [phone]   Fax:  [phone]  Physical Therapy Treatment  Patient Details  Name: Holly Flores MRN: [id number] Date of Birth: 12-04-1945 Referring Provider (PT): Octavio Graves, DO   Encounter Date: 08/05/2018   CLINIC OPERATION CHANGES: Outpatient Neuro Rehab is open at lower capacity following universal masking, social distancing, and patient screening.  The patient's COVID risk of complications score is 6.   PT End of Session - 08/05/18 1547    Visit Number  7    Number of Visits  12    Date for PT Re-Evaluation  09/08/18    Authorization Type  Progress note every 10th visit; KX modifier at 15th visit.    PT Start Time  1300    PT Stop Time  1345    PT Time Calculation (min)  45 min    Equipment Utilized During Treatment  Other (comment)   Stedy   Activity Tolerance  Patient tolerated treatment well    Behavior During Therapy  WFL for tasks assessed/performed       Past Medical History:  Diagnosis Date  . Anxiety   . Arthritis    "all over" (12/20/2017)  . Central retinal vein occlusion of left eye 01/22/2015  . CHF (congestive heart failure) ([place])   . Chronic bronchitis ([place])    "get it most years" (12/20/2017)  . Chronic lower back pain   . CKD (chronic kidney disease), stage II   . COPD (chronic obstructive pulmonary disease) ([place])   . CVA (cerebral vascular accident) ([place]) 12/30/2014   OCULAR  LEFT  EYE   . Depression   . Fibromyalgia   . GERD (gastroesophageal reflux disease)   . Goiter   . History of colonic polyps 05/14/2014  . Hyperlipidemia   . Hypertension   . Hypothyroidism   . Kidney cysts    left side  . 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 Migraine    "couple times/wk; been having them for a long time" (12/20/2017)  . Myocardial infarction [facility])    "one dr said I did; one  said I didn't;  don't remember when it was" (12/20/2017)  . 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 On home oxygen therapy    "3L; sleep w/it q night" (12/20/2017)  . Pneumonia    "several times" (12/20/2017)  . Shortness of breath   . Sleep apnea    "stopped mask when I got on the oxygen" (12/20/2017)  . Stroke [facility])     Past Surgical History:  Procedure Laterality Date  . ANTERIOR CERVICAL DECOMP/DISCECTOMY FUSION    . BACK SURGERY    . BREAST BIOPSY     left-non cancerous  . CARDIAC CATHETERIZATION    . CARPAL TUNNEL RELEASE Left   . CATARACT EXTRACTION W/PHACO  09/21/2011   Procedure: CATARACT EXTRACTION PHACO AND INTRAOCULAR LENS PLACEMENT (IOC);  Surgeon: Williams Che, MD;  Location: [facility];  Service: Ophthalmology;  Laterality: Left;  CDE:9.78  . CATARACT EXTRACTION W/PHACO Right 10/15/2014   Procedure: CATARACT EXTRACTION PHACO AND INTRAOCULAR LENS PLACEMENT ([place]);  Surgeon: Williams Che, MD;  Location: [facility];  Service: Ophthalmology;  Laterality: Right;  CDE:4.19  . [place]; 1972  . CHOLECYSTECTOMY OPEN    . COLONOSCOPY  2006   RMR: 1. Internal hemorroids, otherwise normal rectum. 2. Pedunculated polyp at 35 cm. reomved with snare.  The remainder of teh colonic mucosa appeared normal.   . COLONOSCOPY  2011   Dr. Gala Romney: multiple ascending colon polyps and rectal polyp, adenomatous  . COLONOSCOPY N/A 05/31/2014   Procedure: COLONOSCOPY;  Surgeon: Daneil Dolin, MD;  Location: [facility];  Service: Endoscopy;  Laterality: N/A;  130  . COLONOSCOPY WITH PROPOFOL N/A 06/24/2017   Procedure: COLONOSCOPY WITH PROPOFOL;  Surgeon: Daneil Dolin, MD;  Location: [facility];  Service: Endoscopy;  Laterality: N/A;  2:15pm  . ESOPHAGOGASTRODUODENOSCOPY  2006   Dr. Gala Romney: Subtle Schatzkis ring, otherwise normal upper GI tract, aside from a small pyloric channell erosion, status post dilation as described above.   Marland Kitchen FLEXIBLE SIGMOIDOSCOPY N/A 11/11/2017   Procedure:  FLEXIBLE SIGMOIDOSCOPY;  Surgeon: Otis Brace, MD;  Location: [facility];  Service: Gastroenterology;  Laterality: N/A;  . JOINT REPLACEMENT    . LUMBAR DISC SURGERY  X 2  . POLYPECTOMY  06/24/2017   Procedure: POLYPECTOMY;  Surgeon: Daneil Dolin, MD;  Location: [facility];  Service: Endoscopy;;  ascending  . POSTERIOR LUMBAR FUSION  ? date 1st fusion ; 07/2006   previous L4-5 Ray threaded fusion cage; Exploration of L4-5 fusion & PLIF 07/22/2006/notes 07/22/2006  . TONSILLECTOMY    . TOTAL KNEE ARTHROPLASTY Right 05/24/2013   Procedure: TOTAL KNEE ARTHROPLASTY;  Surgeon: [name], MD;  Location: [facility];  Service: Orthopedics;  Laterality: Right;  . TOTAL KNEE ARTHROPLASTY Left   . VAGINAL HYSTERECTOMY      Vitals:   08/05/18 1304  BP: (!) 141/90  Pulse: 71    Subjective Assessment - 08/05/18 1305    Subjective  Pt transitioning to Neuro based clinic to address impairments following CVA.  Has not stood in months.  Arrives in wheelchair with family.    Patient is accompained by:  Family member    Pertinent History  left sided CVA 10/29/17, Chronic CHF, AFib, COPD, Chronic Kidney Disease stage III, hx of bil TKA, Lumbar surgery    Limitations  Lifting;Standing;Walking;House hold activities    Diagnostic tests  MRI: left external capsule CVA    Patient Stated Goals  move around better in the home and improve function    Currently in Pain?  Yes    Pain Location  Arm    Pain Orientation  Right    Pain Type  Chronic pain    Pain Onset  More than a month ago                       [facility] Adult PT Treatment/Exercise - 08/05/18 1532      Transfers   Sit to Stand  3: Mod assist;From elevated surface;With upper extremity assist    Sit to Stand Details (indicate cue type and reason)  Performed sit <> stand from w/c and mat with use of Stedy and therapist providing mod A to shift weight fully forwards and to provide extension at R knee and hip.  From [facility] - elevated  surface - performed sit > stand with min A on R side with verbal cues for upright trunk during anterior lean to shift COG over BOS.  Performed x 5 reps from [employer] to Sit  3: Mod assist;With upper extremity assist    Transfer via [place]    Therapeutic Activities  Other Therapeutic Activities    Other Therapeutic Activities  Pt arrived in  wheelchair without leg rests.  Pt's pelvis had slid forward in her seat causing sacral sitting and slumped posture with R lateral flexion.  Grandson reports they do have leg rests they usually have on the chair and she usually has a pillow behind her back for more support - did not have them today because they were in a rush to get into the building from the car.  Also added thick towel under cushion cover on R side to wedge pelvis to midline by facilitating R trunk shortening and L trunk elongation.  Also discussed with pt and grandson adding OT and SLP to care team; pt and grandson verbalized agreement - will send request to physician for OT and SLP order      Neuro Re-ed    Neuro Re-ed Details   Performed static standing in [place] with UE support and therapist providing facilitation on R side to maintain hip and knee extension while pt performed pelvic lateral weight shifting to R side and maintaining WB over RLE while lifting L heel.  Required verbal cues to maintain head in midline and upright trunk.  Once seated continued NMR with RUE supported on [place] and performing lateral pelvic tilts with thoracic elongation to R to maintain head control while reaching and pushing RUE forwards and to the R.  Attempted to perform same movement on L for active trunk shortening on R but pt unable to perform.  Transitioned to having RUE on [place] and LUE on Stedy bar and maintaining head in midline and upright trunk while leaning trunk forwards pushing Stedy forwards and then back while with improved trunk control in order to fully  shift COG over BOS to prepare for standing.  Pt required frequent cues to bring head to midline and to depress L shoulder - pt rests with head in L lateral flexion and L shoulder elevated             PT Education - 08/05/18 1547    Education Details  see TA    Person(s) Educated  Patient;Child(ren)    Methods  Explanation    Comprehension  Verbalized understanding          PT Long Term Goals - 08/05/18 1550      PT LONG TERM GOAL #1   Title  Patient will be independent with HEP    Time  4    Period  Weeks    Status  New    Target Date  08/19/18      PT LONG TERM GOAL #2   Title  Patient will transfer with supervision from wheelchair to plinth with slideboard    Time  4    Period  Weeks    Status  New    Target Date  08/19/18      PT LONG TERM GOAL #3   Title  Patient will demonstrate sit to stand transfer with min A to close supervision assist.    Time  4    Period  Weeks    Status  New    Target Date  08/19/18      PT LONG TERM GOAL #4   Title  Patient will demonstrate standing tolerance to 2 minutes to perform functional tasks for ADLs.    Time  4    Period  Weeks    Status  New    Target Date  08/19/18      PT LONG TERM GOAL #5   Title  Patient will demonstrate 4+/5  or greater in left unaffected LE and UE to improve ability to perform functional tasks.    Time  4    Period  Weeks    Status  New    Target Date  08/19/18      PT LONG TERM GOAL #6   Title  Patient will ambulate 40 feet or greater with min A and use of hemi walker to safely navigate around home.    Time  4    Period  Weeks    Status  New    Target Date  08/19/18            Plan - 08/05/18 1551    Clinical Impression Statement  Skilled PT treatment session focused on use of Stedy for blocked practice of sit <> stand, weight shifting in standing, increased WB through RLE and NMR for head righting and trunk/postural control in tall sitting on steady and short sitting at edge of mat  with RUE supported.  Pt demonstrates significant tightness and flexion on L cervical spine, L shoulder elevation and L trunk shortening with elevated pelvis.  Wedge placed on R side of w/c cushion to correct obliquity and improve pelvic alignment and stability.  Pt continues to have significant hemiplegia on R side with little to no active movement observed today.  Will continue to address in order to progress towards LTG.    Personal Factors and Comorbidities  Age;Comorbidity 3+    Examination-Activity Limitations  Lift;Dressing;Self Feeding;Transfers;Toileting;Stand;Locomotion Level;Reach Overhead    Stability/Clinical Decision Making  Stable/Uncomplicated    Rehab Potential  Fair    PT Frequency  3x / week    PT Duration  4 weeks    PT Treatment/Interventions  ADLs/Self Care Home Management;Electrical Stimulation;Gait training;Stair training;Functional mobility training;Balance training;Therapeutic exercise;Therapeutic activities;Neuromuscular re-education;Wheelchair mobility training;Manual techniques;Passive range of motion;Patient/family education;DME Instruction;Energy conservation    PT Next Visit Plan  Did order for OT and SLP come through?  Is wedge still on R side of cushion?  Sit <> stand with Stedy, blocked practice sit <> stand, standing weight shifts and lifting L heel in Stedy; try pre-tibial AFO on RLE to provide some knee stability in standing?    Recommended Other Services  OT and SLP    Consulted and Agree with Plan of Care  Patient;Family member/caregiver       Patient will benefit from skilled therapeutic intervention in order to improve the following deficits and impairments:  Pain, Postural dysfunction, Impaired tone, Decreased mobility, Decreased activity tolerance, Decreased range of motion, Decreased strength, Impaired UE functional use, Difficulty walking  Visit Diagnosis: 1. Muscle weakness (generalized)   2. Chronic right shoulder pain   3. Difficulty in walking, not  elsewhere classified        Problem List Patient Active Problem List   Diagnosis Date Noted  . Physical deconditioning 12/19/2017  . Chronic kidney disease (CKD), stage III (moderate) ([facility])   . Hypoalbuminemia due to protein-calorie malnutrition ([place])   . Intractable migraine without status migrainosus   . Reactive depression   . Hemiparesis affecting right side as late effect of stroke ([place])   . Slow transit constipation   . PAF (paroxysmal atrial fibrillation) ([place])   . Dysphasia, post-stroke   . COPD (chronic obstructive pulmonary disease) ([place]) 10/29/2017  . Chronic diastolic CHF (congestive heart failure) ([place]) 10/29/2017  . Hypothyroidism 10/29/2017  . Essential hypertension 10/29/2017  . GERD (gastroesophageal reflux disease) 10/29/2017  . Anxiety 10/29/2017  . DJD (degenerative joint disease) of  knee 05/24/2013  . Hyperlipidemia 04/20/2013    Rico Junker, PT, DPT 08/05/18    4:04 PM    [facility] [address] [place], [place], [address] Phone: [phone]   Fax:  [phone]  Name: MARIALUIZA CAR MRN: [id number] Date of Birth: 03/29/1945

## 2018-08-11 ENCOUNTER — Ambulatory Visit: Payer: Medicare Other | Admitting: Physical Therapy

## 2018-08-12 ENCOUNTER — Other Ambulatory Visit: Payer: Self-pay

## 2018-08-12 ENCOUNTER — Ambulatory Visit: Payer: Medicare Other | Admitting: Physical Therapy

## 2018-08-12 ENCOUNTER — Encounter: Payer: Self-pay | Admitting: Physical Therapy

## 2018-08-12 VITALS — BP 141/97 | HR 54

## 2018-08-12 DIAGNOSIS — R262 Difficulty in walking, not elsewhere classified: Secondary | ICD-10-CM

## 2018-08-12 DIAGNOSIS — M6281 Muscle weakness (generalized): Secondary | ICD-10-CM | POA: Diagnosis not present

## 2018-08-12 NOTE — Therapy (Signed)
[facility] [address] [place], [place], [address] Phone: [phone]   Fax:  [phone]  Physical Therapy Treatment  Patient Details  Name: Holly Flores MRN: [id number] Date of Birth: 01-Dec-1945 Referring Provider (PT): Octavio Graves, DO   Encounter Date: 08/12/2018   CLINIC OPERATION CHANGES: Outpatient Neuro Rehab is open at lower capacity following universal masking, social distancing, and patient screening.  The patient's COVID risk of complications score is 6.   PT End of Session - 08/12/18 1558    Visit Number  8    Number of Visits  12    Date for PT Re-Evaluation  09/08/18    Authorization Type  Progress note every 10th visit; KX modifier at 15th visit.    PT Start Time  1400    PT Stop Time  1450    PT Time Calculation (min)  50 min    Equipment Utilized During Treatment  Other (comment)   Stedy   Activity Tolerance  Patient tolerated treatment well    Behavior During Therapy  WFL for tasks assessed/performed       Past Medical History:  Diagnosis Date  . Anxiety   . Arthritis    "all over" (12/20/2017)  . Central retinal vein occlusion of left eye 01/22/2015  . CHF (congestive heart failure) ([place])   . Chronic bronchitis ([place])    "get it most years" (12/20/2017)  . Chronic lower back pain   . CKD (chronic kidney disease), stage II   . COPD (chronic obstructive pulmonary disease) ([place])   . CVA (cerebral vascular accident) ([place]) 12/30/2014   OCULAR  LEFT  EYE   . Depression   . Fibromyalgia   . GERD (gastroesophageal reflux disease)   . Goiter   . History of colonic polyps 05/14/2014  . Hyperlipidemia   . Hypertension   . Hypothyroidism   . Kidney cysts    left side  . 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 Migraine    "couple times/wk; been having them for a long time" (12/20/2017)  . Myocardial infarction [facility])    "one dr said I did; one  said I didn't;  don't remember when it was" (12/20/2017)  . 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 On home oxygen therapy    "3L; sleep w/it q night" (12/20/2017)  . Pneumonia    "several times" (12/20/2017)  . Shortness of breath   . Sleep apnea    "stopped mask when I got on the oxygen" (12/20/2017)  . Stroke [facility])     Past Surgical History:  Procedure Laterality Date  . ANTERIOR CERVICAL DECOMP/DISCECTOMY FUSION    . BACK SURGERY    . BREAST BIOPSY     left-non cancerous  . CARDIAC CATHETERIZATION    . CARPAL TUNNEL RELEASE Left   . CATARACT EXTRACTION W/PHACO  09/21/2011   Procedure: CATARACT EXTRACTION PHACO AND INTRAOCULAR LENS PLACEMENT (IOC);  Surgeon: Williams Che, MD;  Location: [facility];  Service: Ophthalmology;  Laterality: Left;  CDE:9.78  . CATARACT EXTRACTION W/PHACO Right 10/15/2014   Procedure: CATARACT EXTRACTION PHACO AND INTRAOCULAR LENS PLACEMENT ([place]);  Surgeon: Williams Che, MD;  Location: [facility];  Service: Ophthalmology;  Laterality: Right;  CDE:4.19  . [place]; 1972  . CHOLECYSTECTOMY OPEN    . COLONOSCOPY  2006   RMR: 1. Internal hemorroids, otherwise normal rectum. 2. Pedunculated polyp at 35 cm. reomved with snare.  The remainder of teh colonic mucosa appeared normal.   . COLONOSCOPY  2011   Dr. Gala Romney: multiple ascending colon polyps and rectal polyp, adenomatous  . COLONOSCOPY N/A 05/31/2014   Procedure: COLONOSCOPY;  Surgeon: Daneil Dolin, MD;  Location: [facility];  Service: Endoscopy;  Laterality: N/A;  130  . COLONOSCOPY WITH PROPOFOL N/A 06/24/2017   Procedure: COLONOSCOPY WITH PROPOFOL;  Surgeon: Daneil Dolin, MD;  Location[facility];  Service: Endoscopy;  Laterality: N/A;  2:15pm  . ESOPHAGOGASTRODUODENOSCOPY  2006   Dr. Gala Romney: Subtle Schatzkis ring, otherwise normal upper GI tract, aside from a small pyloric channell erosion, status post dilation as described above.   Marland Kitchen FLEXIBLE SIGMOIDOSCOPY N/A 11/11/2017   Procedure:  FLEXIBLE SIGMOIDOSCOPY;  Surgeon: Otis Brace, MD;  Location: [facility];  Service: Gastroenterology;  Laterality: N/A;  . JOINT REPLACEMENT    . LUMBAR DISC SURGERY  X 2  . POLYPECTOMY  06/24/2017   Procedure: POLYPECTOMY;  Surgeon: Daneil Dolin, MD;  Location: [facility];  Service: Endoscopy;;  ascending  . POSTERIOR LUMBAR FUSION  ? date 1st fusion ; 07/2006   previous L4-5 Ray threaded fusion cage; Exploration of L4-5 fusion & PLIF 07/22/2006/notes 07/22/2006  . TONSILLECTOMY    . TOTAL KNEE ARTHROPLASTY Right 05/24/2013   Procedure: TOTAL KNEE ARTHROPLASTY;  Surgeon: [name], MD;  Location: [facility];  Service: Orthopedics;  Laterality: Right;  . TOTAL KNEE ARTHROPLASTY Left   . VAGINAL HYSTERECTOMY      Vitals:   08/12/18 1409  BP: (!) 141/97  Pulse: (!) 54    Subjective Assessment - 08/12/18 1402    Subjective  Was sore after last session in her hips.  Everything is going well at home except having a hard time finding someone to be with her 24/7.  Asking how much personal aides cost.    Patient is accompained by:  Family member    Pertinent History  left sided CVA 10/29/17, Chronic CHF, AFib, COPD, Chronic Kidney Disease stage III, hx of bil TKA, Lumbar surgery    Limitations  Lifting;Standing;Walking;House hold activities    Diagnostic tests  MRI: left external capsule CVA    Patient Stated Goals  move around better in the home and improve function    Currently in Pain?  No/denies    Pain Onset  More than a month ago                       [facility] Adult PT Treatment/Exercise - 08/12/18 1546      Bed Mobility   Bed Mobility  Rolling Left;Rolling Right;Right Sidelying to Sit;Sitting - Scoot to Edge of Bed    Rolling Right  Moderate Assistance - Patient 50-74%    Rolling Left  Moderate Assistance - Patient 50-74%    Right Sidelying to Sit  Maximal Assistance - Patient 25-49%    Sitting - Scoot to Edge of Bed  Moderate Assistance - Patient 50-74%       Transfers   Transfers  Sit to Stand;Stand to Sit    Sit to Stand  3: Mod assist;With upper extremity assist    Sit to Stand Details (indicate cue type and reason)  from w/c and then mat with UE support on Stedy and verbal cues for upright gaze and trunk with full anterior lean and weight shift over BOS    Stand to Sit  3: Mod assist    Stand to Sit Details  cues to sit hips all the way back in chair    Transfer via [place]    Number of Reps  2 sets      Knee/Hip Exercises: Supine   Heel Slides  Right;Both;1 set;10 reps    Heel Slides Limitations  with therapist providing support under R knee and pillow case under R foot performed for hamstring activation on R; unable to activate in isolation but when combined with activation of L heel slide pt able to activate R hamstring very slightly and very briefly    Bridges  Both;1 set;10 reps    Bridges Limitations  attempted 3 reps of maintaining hip extension through R hip while lifting L foot off mat; only able to hold briefly once.  Glute and hamstring activation noted during bridges    Other Supine Knee/Hip Exercises  LLE marching with cues to stabilize and push RLE down into mat x 10 reps      Knee/Hip Exercises: Sidelying   Other Sidelying Knee/Hip Exercises  First placed RLE on powder board for gravity minimized  AROM into hip flexion and extension with knee flexion<> extension.  Also utilized Geologist, engineering for visual feedback for RLE movement.  Powder board noted to be cutting into patient's thigh so removed powder board and therapist provided support to RLE.  Performed 10 reps flexion <> extension with very mild, brief activation of quad and glutes noted             PT Education - 08/12/18 1558    Education Details  added another visit next week for 2x/week; have requested OT and SLP from PCP, awaiting signature to be faxed back    Person(s) Educated  Patient    Methods  Explanation    Comprehension  Verbalized understanding           PT Long Term Goals - 08/05/18 1550      PT LONG TERM GOAL #1   Title  Patient will be independent with HEP    Time  4    Period  Weeks    Status  New    Target Date  08/19/18      PT LONG TERM GOAL #2   Title  Patient will transfer with supervision from wheelchair to plinth with slideboard    Time  4    Period  Weeks    Status  New    Target Date  08/19/18      PT LONG TERM GOAL #3   Title  Patient will demonstrate sit to stand transfer with min A to close supervision assist.    Time  4    Period  Weeks    Status  New    Target Date  08/19/18      PT LONG TERM GOAL #4   Title  Patient will demonstrate standing tolerance to 2 minutes to perform functional tasks for ADLs.    Time  4    Period  Weeks    Status  New    Target Date  08/19/18      PT LONG TERM GOAL #5   Title  Patient will demonstrate 4+/5 or greater in left unaffected LE and UE to improve ability to perform functional tasks.    Time  4    Period  Weeks    Status  New    Target Date  08/19/18      PT LONG TERM GOAL #6   Title  Patient will ambulate  40 feet or greater with min A and use of hemi walker to safely navigate around home.    Time  4    Period  Weeks    Status  New    Target Date  08/19/18            Plan - 08/12/18 1559    Clinical Impression Statement  Attempted to utilize powder board under RLE for gravity minimized AROM but unable to tolerate due to pressure on thigh.  In sidelying and supine only very slight, very brief activation of RLE  noted in open chain.  Increased activation noted in WB positions.  Pt demonstrated more equal WB and use of RLE during second sit > stand from mat after exercises.  Will continue to address in order to progress towards LTG.    Personal Factors and Comorbidities  Age;Comorbidity 3+;Time since onset of injury/illness/exacerbation    Examination-Activity Limitations  Lift;Dressing;Self Feeding;Transfers;Toileting;Stand;Locomotion Level;Reach  Overhead;Bed Mobility    Stability/Clinical Decision Making  Stable/Uncomplicated    Rehab Potential  Fair    PT Frequency  3x / week    PT Duration  4 weeks    PT Treatment/Interventions  ADLs/Self Care Home Management;Electrical Stimulation;Gait training;Stair training;Functional mobility training;Balance training;Therapeutic exercise;Therapeutic activities;Neuromuscular re-education;Wheelchair mobility training;Manual techniques;Passive range of motion;Patient/family education;DME Instruction;Energy conservation    PT Next Visit Plan  Did order for OT and SLP come through?  Sit <> stand with Stedy, blocked practice sit <> stand, standing weight shifts and lifting L heel in Stedy; try pre-tibial AFO on RLE to provide some knee stability in standing?  WB exercises for mm activation    Consulted and Agree with Plan of Care  Patient;Family member/caregiver       Patient will benefit from skilled therapeutic intervention in order to improve the following deficits and impairments:  Pain, Postural dysfunction, Impaired tone, Decreased mobility, Decreased activity tolerance, Decreased range of motion, Decreased strength, Impaired UE functional use, Difficulty walking, Decreased balance  Visit Diagnosis: 1. Muscle weakness (generalized)   2. Difficulty in walking, not elsewhere classified        Problem List Patient Active Problem List   Diagnosis Date Noted  . Physical deconditioning 12/19/2017  . Chronic kidney disease (CKD), stage III (moderate) (HCC)   . Hypoalbuminemia due to protein-calorie malnutrition ([place])   . Intractable migraine without status migrainosus   . Reactive depression   . Hemiparesis affecting right side as late effect of stroke ([place])   . Slow transit constipation   . PAF (paroxysmal atrial fibrillation) ([place])   . Dysphasia, post-stroke   . COPD (chronic obstructive pulmonary disease) ([place]) 10/29/2017  . Chronic diastolic CHF (congestive heart failure) ([place])  10/29/2017  . Hypothyroidism 10/29/2017  . Essential hypertension 10/29/2017  . GERD (gastroesophageal reflux disease) 10/29/2017  . Anxiety 10/29/2017  . 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 Hyperlipidemia 04/20/2013   Rico Junker, PT, DPT 08/12/18    4:04 PM    [facility] [address] [place], [place], [address] Phone: [phone]   Fax:  [phone]  Name: Holly Flores MRN: [id number] Date of Birth: [birth year]

## 2018-08-16 ENCOUNTER — Ambulatory Visit: Payer: Medicare Other | Admitting: Physical Therapy

## 2018-08-17 ENCOUNTER — Encounter: Payer: Self-pay | Admitting: Physical Therapy

## 2018-08-17 ENCOUNTER — Ambulatory Visit: Payer: Medicare Other | Attending: *Deleted | Admitting: Physical Therapy

## 2018-08-17 ENCOUNTER — Other Ambulatory Visit: Payer: Self-pay

## 2018-08-17 DIAGNOSIS — I69351 Hemiplegia and hemiparesis following cerebral infarction affecting right dominant side: Secondary | ICD-10-CM | POA: Insufficient documentation

## 2018-08-17 DIAGNOSIS — R41841 Cognitive communication deficit: Secondary | ICD-10-CM | POA: Diagnosis present

## 2018-08-17 DIAGNOSIS — R471 Dysarthria and anarthria: Secondary | ICD-10-CM | POA: Diagnosis present

## 2018-08-17 DIAGNOSIS — M25612 Stiffness of left shoulder, not elsewhere classified: Secondary | ICD-10-CM | POA: Diagnosis present

## 2018-08-17 DIAGNOSIS — R293 Abnormal posture: Secondary | ICD-10-CM | POA: Insufficient documentation

## 2018-08-17 DIAGNOSIS — M6281 Muscle weakness (generalized): Secondary | ICD-10-CM

## 2018-08-17 DIAGNOSIS — R262 Difficulty in walking, not elsewhere classified: Secondary | ICD-10-CM | POA: Diagnosis present

## 2018-08-17 DIAGNOSIS — R2689 Other abnormalities of gait and mobility: Secondary | ICD-10-CM | POA: Diagnosis present

## 2018-08-17 DIAGNOSIS — M25511 Pain in right shoulder: Secondary | ICD-10-CM | POA: Insufficient documentation

## 2018-08-17 DIAGNOSIS — G8929 Other chronic pain: Secondary | ICD-10-CM | POA: Insufficient documentation

## 2018-08-17 DIAGNOSIS — R29818 Other symptoms and signs involving the nervous system: Secondary | ICD-10-CM | POA: Insufficient documentation

## 2018-08-17 NOTE — Therapy (Signed)
[facility] [address] [place], [place], [address] Phone: [phone]   Fax:  [phone]  Physical Therapy Treatment  Patient Details  Name: Holly Flores MRN: [id number] Date of Birth: [birth year] Referring Provider (PT): Octavio Graves, DO   Encounter Date: 08/17/2018   CLINIC OPERATION CHANGES: Outpatient Neuro Rehab is open at lower capacity following universal masking, social distancing, and patient screening.  The patient's COVID risk of complications score is 6.   PT End of Session - 08/17/18 1509    Visit Number  9    Number of Visits  12    Date for PT Re-Evaluation  09/08/18    Authorization Type  Progress note every 10th visit; KX modifier at 15th visit.    PT Start Time  1400    PT Stop Time  1445    PT Time Calculation (min)  45 min    Equipment Utilized During Treatment  Other (comment)   AFO   Activity Tolerance  Patient tolerated treatment well    Behavior During Therapy  WFL for tasks assessed/performed       Past Medical History:  Diagnosis Date  . Anxiety   . Arthritis    "all over" (12/20/2017)  . Central retinal vein occlusion of left eye 01/22/2015  . CHF (congestive heart failure) ([place])   . Chronic bronchitis ([place])    "get it most years" (12/20/2017)  . Chronic lower back pain   . CKD (chronic kidney disease), stage II   . COPD (chronic obstructive pulmonary disease) ([place])   . CVA (cerebral vascular accident) ([place]) 12/30/2014   OCULAR  LEFT  EYE   . Depression   . Fibromyalgia   . GERD (gastroesophageal reflux disease)   . Goiter   . History of colonic polyps 05/14/2014  . Hyperlipidemia   . Hypertension   . Hypothyroidism   . Kidney cysts    left side  . 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 Migraine    "couple times/wk; been having them for a long time" (12/20/2017)  . Myocardial infarction [facility])    "one dr said I did; one said  I didn't;  don't remember when it was" (12/20/2017)  . 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 On home oxygen therapy    "3L; sleep w/it q night" (12/20/2017)  . Pneumonia    "several times" (12/20/2017)  . Shortness of breath   . Sleep apnea    "stopped mask when I got on the oxygen" (12/20/2017)  . Stroke [facility])     Past Surgical History:  Procedure Laterality Date  . ANTERIOR CERVICAL DECOMP/DISCECTOMY FUSION    . BACK SURGERY    . BREAST BIOPSY     left-non cancerous  . CARDIAC CATHETERIZATION    . CARPAL TUNNEL RELEASE Left   . CATARACT EXTRACTION W/PHACO  09/21/2011   Procedure: CATARACT EXTRACTION PHACO AND INTRAOCULAR LENS PLACEMENT (IOC);  Surgeon: Williams Che, MD;  Location: [facility];  Service: Ophthalmology;  Laterality: Left;  CDE:9.78  . CATARACT EXTRACTION W/PHACO Right 10/15/2014   Procedure: CATARACT EXTRACTION PHACO AND INTRAOCULAR LENS PLACEMENT ([place]);  Surgeon: Williams Che, MD;  Location: [facility];  Service: Ophthalmology;  Laterality: Right;  CDE:4.19  . [place]; 1972  . CHOLECYSTECTOMY OPEN    . COLONOSCOPY  2006   RMR: 1. Internal hemorroids, otherwise normal rectum. 2. Pedunculated polyp at 35 cm. reomved with snare.  The remainder of teh colonic mucosa appeared normal.   . COLONOSCOPY  2011   Dr. Gala Romney: multiple ascending colon polyps and rectal polyp, adenomatous  . COLONOSCOPY N/A 05/31/2014   Procedure: COLONOSCOPY;  Surgeon: Daneil Dolin, MD;  [facility];  Service: Endoscopy;  Laterality: N/A;  130  . COLONOSCOPY WITH PROPOFOL N/A 06/24/2017   Procedure: COLONOSCOPY WITH PROPOFOL;  Surgeon: Daneil Dolin, MD;  [facility];  Service: Endoscopy;  Laterality: N/A;  2:15pm  . ESOPHAGOGASTRODUODENOSCOPY  2006   Dr. Gala Romney: Subtle Schatzkis ring, otherwise normal upper GI tract, aside from a small pyloric channell erosion, status post dilation as described above.   Marland Kitchen FLEXIBLE SIGMOIDOSCOPY N/A 11/11/2017   Procedure: FLEXIBLE  SIGMOIDOSCOPY;  Surgeon: Otis Brace, MD;  Location: [facility];  Service: Gastroenterology;  Laterality: N/A;  . JOINT REPLACEMENT    . LUMBAR DISC SURGERY  X 2  . POLYPECTOMY  06/24/2017   Procedure: POLYPECTOMY;  Surgeon: Daneil Dolin, MD;  [facility];  Service: Endoscopy;;  ascending  . POSTERIOR LUMBAR FUSION  ? date 1st fusion ; 07/2006   previous L4-5 Ray threaded fusion cage; Exploration of L4-5 fusion & PLIF 07/22/2006/notes 07/22/2006  . TONSILLECTOMY    . TOTAL KNEE ARTHROPLASTY Right 05/24/2013   Procedure: TOTAL KNEE ARTHROPLASTY;  Surgeon: [name], MD;  Location: [employer];  Service: Orthopedics;  Laterality: Right;  . TOTAL KNEE ARTHROPLASTY Left   . VAGINAL HYSTERECTOMY      There were no vitals filed for this visit.  Subjective Assessment - 08/17/18 1405    Subjective  Felt a pain in her R calf when getting out of the car.  Thinks it felt like a cramp.  Has been working on exercises with theraband at home.    Patient is accompained by:  Family member    Pertinent History  left sided CVA 10/29/17, Chronic CHF, AFib, COPD, Chronic Kidney Disease stage III, hx of bil TKA, Lumbar surgery    Limitations  Lifting;Standing;Walking;House hold activities    Diagnostic tests  MRI: left external capsule CVA    Patient Stated Goals  move around better in the home and improve function    Currently in Pain?  No/denies    Pain Onset  More than a month ago                       [facility] Adult PT Treatment/Exercise - 08/17/18 1405      Transfers   Transfers  Sit to Stand;Stand to Sit    Sit to Stand  3: Mod assist;With upper extremity assist    Sit to Stand Details (indicate cue type and reason)  Mod A to stand from w/c with LUE support on // bars; when standing with RW pt required max A to bring COG fully anterior over BOS    Stand to Sit  3: Mod assist      Ambulation/Gait   Ambulation/Gait  Yes    Ambulation/Gait Assistance  2: Max assist     Ambulation/Gait Assistance Details  Performed gait with LUE support on // bar first to assess stability with blue rocker AFO.  Performed down // bars x 2 reps with therapist on R side assisting with upright trunk, weight shifting, clearance and placement of RLE during swing and knee stability during stance.  Then performed gait with RW with R hand orthosis x 10' + 5' with two therapists - one  on L to assist with controlling the RW and other therapist on R side continuing to provide assistance for upright trunk, fowards and anterior weight shifting over RLE, knee stability in R stance and advancement/placement of RLE during swing phase.  Pt fatigued quickly    Ambulation Distance (Feet)  10 Feet    Assistive device  Parallel bars;Rolling walker;Other (Comment)   Blue rocker AFO   Gait Pattern  Step-to pattern;Decreased step length - left;Decreased stance time - right;Decreased hip/knee flexion - right;Decreased dorsiflexion - right;Decreased weight shift to right;Right flexed knee in stance;Lateral trunk lean to left    Ambulation Surface  Level;Indoor    Pre-Gait Activities  Prior to gait training at // bars and with RW assessed two different pre-tibial AFO: Blue rocker vs. [place] ground reaction AFO.  The [place] - due to size and stiffness of lateral strut pushed pt's ankle into pronation; blue rocker provided pt with greater knee support and maintained ankle in more neutral position.  Also discussed other options for shoe wear with increased arch support and ankle support      Therapeutic Activites    Therapeutic Activities  Other Therapeutic Activities    Other Therapeutic Activities  assessed R calf:  negative for tenderness, edema, warmth or redness.  Not tender to palpation or with PROM             PT Education - 08/17/18 1509    Education Details  shoe wear and AFO options    Person(s) Educated  Patient;Child(ren)    Methods  Explanation    Comprehension  Verbalized understanding           PT Long Term Goals - 08/05/18 1550      PT LONG TERM GOAL #1   Title  Patient will be independent with HEP    Time  4    Period  Weeks    Status  New    Target Date  08/19/18      PT LONG TERM GOAL #2   Title  Patient will transfer with supervision from wheelchair to plinth with slideboard    Time  4    Period  Weeks    Status  New    Target Date  08/19/18      PT LONG TERM GOAL #3   Title  Patient will demonstrate sit to stand transfer with min A to close supervision assist.    Time  4    Period  Weeks    Status  New    Target Date  08/19/18      PT LONG TERM GOAL #4   Title  Patient will demonstrate standing tolerance to 2 minutes to perform functional tasks for ADLs.    Time  4    Period  Weeks    Status  New    Target Date  08/19/18      PT LONG TERM GOAL #5   Title  Patient will demonstrate 4+/5 or greater in left unaffected LE and UE to improve ability to perform functional tasks.    Time  4    Period  Weeks    Status  New    Target Date  08/19/18      PT LONG TERM GOAL #6   Title  Patient will ambulate 40 feet or greater with min A and use of hemi walker to safely navigate around home.    Time  4    Period  Weeks    Status  New    Target Date  08/19/18            Plan - 08/17/18 1510    Clinical Impression Statement  Treatment session focused on trial of two types of ground reaction AFO to assist with foot clearance and knee stability in standing and initiated gait training with // bars for greater support and then with RW with +2 assist.  Pt fatigued quickly but did demonstrate increased activation of R hip flexion during swing when cued to "kick the ball" and improved weight acceptance over RLE.  Will continue to practice and address tomorrow and will seek DME referral from MD for AFO for home to assist with transfers and standing.    Personal Factors and Comorbidities  Age;Comorbidity 3+;Time since onset of injury/illness/exacerbation     Examination-Activity Limitations  Lift;Dressing;Self Feeding;Transfers;Toileting;Stand;Locomotion Level;Reach Overhead;Bed Mobility    Stability/Clinical Decision Making  Stable/Uncomplicated    Rehab Potential  Fair    PT Frequency  3x / week    PT Duration  4 weeks    PT Treatment/Interventions  ADLs/Self Care Home Management;Electrical Stimulation;Gait training;Stair training;Functional mobility training;Balance training;Therapeutic exercise;Therapeutic activities;Neuromuscular re-education;Wheelchair mobility training;Manual techniques;Passive range of motion;Patient/family education;DME Instruction;Energy conservation    PT Next Visit Plan  10th visit PN.  Did order for OT and SLP come through?  Gait at // bars with [place] blue rocker AFO; gait with RW if have two people.  Add to HEP: WB exercises for mm activation    Consulted and Agree with Plan of Care  Patient;Family member/caregiver       Patient will benefit from skilled therapeutic intervention in order to improve the following deficits and impairments:  Pain, Postural dysfunction, Impaired tone, Decreased mobility, Decreased activity tolerance, Decreased range of motion, Decreased strength, Impaired UE functional use, Difficulty walking, Decreased balance  Visit Diagnosis: 1. Muscle weakness (generalized)   2. Difficulty in walking, not elsewhere classified        Problem List Patient Active Problem List   Diagnosis Date Noted  . Physical deconditioning 12/19/2017  . Chronic kidney disease (CKD), stage III (moderate) (HCC)   . Hypoalbuminemia due to protein-calorie malnutrition ([place])   . Intractable migraine without status migrainosus   . Reactive depression   . Hemiparesis affecting right side as late effect of stroke ([place])   . Slow transit constipation   . PAF (paroxysmal atrial fibrillation) ([place])   . Dysphasia, post-stroke   . COPD (chronic obstructive pulmonary disease) ([place]) 10/29/2017  . Chronic diastolic CHF  (congestive heart failure) ([place]) 10/29/2017  . Hypothyroidism 10/29/2017  . Essential hypertension 10/29/2017  . GERD (gastroesophageal reflux disease) 10/29/2017  . Anxiety 10/29/2017  . 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 Hyperlipidemia 04/20/2013   Rico Junker, PT, DPT 08/17/18    3:16 PM    [facility] [address] [place], [place], [address] Phone: [phone]   Fax:  [phone]  Name: Holly Flores MRN: [id number] Date of Birth: 04-25-1945

## 2018-08-18 ENCOUNTER — Ambulatory Visit: Payer: Medicare Other | Admitting: Physical Therapy

## 2018-08-18 ENCOUNTER — Encounter: Payer: Self-pay | Admitting: Physical Therapy

## 2018-08-18 DIAGNOSIS — R262 Difficulty in walking, not elsewhere classified: Secondary | ICD-10-CM

## 2018-08-18 DIAGNOSIS — M6281 Muscle weakness (generalized): Secondary | ICD-10-CM | POA: Diagnosis not present

## 2018-08-18 DIAGNOSIS — R29818 Other symptoms and signs involving the nervous system: Secondary | ICD-10-CM

## 2018-08-18 DIAGNOSIS — I69351 Hemiplegia and hemiparesis following cerebral infarction affecting right dominant side: Secondary | ICD-10-CM

## 2018-08-18 DIAGNOSIS — R293 Abnormal posture: Secondary | ICD-10-CM

## 2018-08-19 NOTE — Therapy (Signed)
[facility] [address] [place], [place], [address] Phone: [phone]   Fax:  [phone]  Physical Therapy Treatment and 10th Visit Progress Note  Patient Details  Name: Holly Flores MRN: [id number] Date of Birth: 10-22-1945 Referring Provider (PT): Octavio Graves, DO   Encounter Date: 08/18/2018   CLINIC OPERATION CHANGES: Outpatient [facility] Rehab is open at lower capacity following universal masking, social distancing, and patient screening.  The patient's COVID risk of complications score is 6.  Progress Note Reporting Period 07/21/2018 to 08/18/2018  See note below for Objective Data and Assessment of Progress/Goals.    PT End of Session - 08/19/18 0853    Visit Number  10    Number of Visits  26    Date for PT Re-Evaluation  10/18/18    Authorization Type  Progress note every 10th visit; KX modifier at 15th visit.    PT Start Time  1402    PT Stop Time  1450    PT Time Calculation (min)  48 min    Equipment Utilized During Treatment  Other (comment)   AFO   Activity Tolerance  Patient tolerated treatment well    Behavior During Therapy  WFL for tasks assessed/performed       Past Medical History:  Diagnosis Date  . Anxiety   . Arthritis    "all over" (12/20/2017)  . Central retinal vein occlusion of left eye 01/22/2015  . CHF (congestive heart failure) ([place])   . Chronic bronchitis ([place])    "get it most years" (12/20/2017)  . Chronic lower back pain   . CKD (chronic kidney disease), stage II   . COPD (chronic obstructive pulmonary disease) ([place])   . CVA (cerebral vascular accident) ([place]) 12/30/2014   OCULAR  LEFT  EYE   . Depression   . Fibromyalgia   . GERD (gastroesophageal reflux disease)   . Goiter   . History of colonic polyps 05/14/2014  . Hyperlipidemia   . Hypertension   . Hypothyroidism   . Kidney cysts    left side  . 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 Migraine    "couple times/wk; been having them for a long time" (12/20/2017)  . Myocardial infarction [facility])    "one dr said I did; one said I didn't;  don't remember when it was" (12/20/2017)  . 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 On home oxygen therapy    "3L; sleep w/it q night" (12/20/2017)  . Pneumonia    "several times" (12/20/2017)  . Shortness of breath   . Sleep apnea    "stopped mask when I got on the oxygen" (12/20/2017)  . Stroke [facility])     Past Surgical History:  Procedure Laterality Date  . ANTERIOR CERVICAL DECOMP/DISCECTOMY FUSION    . BACK SURGERY    . BREAST BIOPSY     left-non cancerous  . CARDIAC CATHETERIZATION    . CARPAL TUNNEL RELEASE Left   . CATARACT EXTRACTION W/PHACO  09/21/2011   Procedure: CATARACT EXTRACTION PHACO AND INTRAOCULAR LENS PLACEMENT (IOC);  Surgeon: Williams Che, MD;  Location: [facility];  Service: Ophthalmology;  Laterality: Left;  CDE:9.78  . CATARACT EXTRACTION W/PHACO Right 10/15/2014   Procedure: CATARACT EXTRACTION PHACO AND INTRAOCULAR LENS PLACEMENT ([place]);  Surgeon: Williams Che, MD;  Location: [facility];  Service: Ophthalmology;  Laterality: Right;  CDE:4.19  . [place]; 1972  . CHOLECYSTECTOMY OPEN    .  COLONOSCOPY  2006   RMR: 1. Internal hemorroids, otherwise normal rectum. 2. Pedunculated polyp at 35 cm. reomved with snare. The remainder of teh colonic mucosa appeared normal.   . COLONOSCOPY  2011   Dr. Gala Romney: multiple ascending colon polyps and rectal polyp, adenomatous  . COLONOSCOPY N/A 05/31/2014   Procedure: COLONOSCOPY;  Surgeon: Daneil Dolin, MD;  Location: [facility];  Service: Endoscopy;  Laterality: N/A;  130  . COLONOSCOPY WITH PROPOFOL N/A 06/24/2017   Procedure: COLONOSCOPY WITH PROPOFOL;  Surgeon: Daneil Dolin, MD;  Location[facility];  Service: Endoscopy;  Laterality: N/A;  2:15pm  . ESOPHAGOGASTRODUODENOSCOPY  2006   Dr. Gala Romney: Subtle Schatzkis ring, otherwise normal upper GI  tract, aside from a small pyloric channell erosion, status post dilation as described above.   Marland Kitchen FLEXIBLE SIGMOIDOSCOPY N/A 11/11/2017   Procedure: FLEXIBLE SIGMOIDOSCOPY;  Surgeon: Otis Brace, MD;  Location: [facility];  Service: Gastroenterology;  Laterality: N/A;  . JOINT REPLACEMENT    . LUMBAR DISC SURGERY  X 2  . POLYPECTOMY  06/24/2017   Procedure: POLYPECTOMY;  Surgeon: Daneil Dolin, MD;  Location: [facility];  Service: Endoscopy;;  ascending  . POSTERIOR LUMBAR FUSION  ? date 1st fusion ; 07/2006   previous L4-5 Ray threaded fusion cage; Exploration of L4-5 fusion & PLIF 07/22/2006/notes 07/22/2006  . TONSILLECTOMY    . TOTAL KNEE ARTHROPLASTY Right 05/24/2013   Procedure: TOTAL KNEE ARTHROPLASTY;  Surgeon: [name], MD;  Location: [facility];  Service: Orthopedics;  Laterality: Right;  . TOTAL KNEE ARTHROPLASTY Left   . VAGINAL HYSTERECTOMY      There were no vitals filed for this visit.  Subjective Assessment - 08/18/18 1406    Subjective  Felt pain in her R calf again when getting out of the car.  Not hurting currently.  Was fatigued after yesterday's session.    Patient is accompained by:  Family member    Pertinent History  left sided CVA 10/29/17, Chronic CHF, AFib, COPD, Chronic Kidney Disease stage III, hx of bil TKA, Lumbar surgery    Limitations  Lifting;Standing;Walking;House hold activities    Diagnostic tests  MRI: left external capsule CVA    Patient Stated Goals  move around better in the home and improve function    Currently in Pain?  No/denies    Pain Onset  More than a month ago         [facility] PT Assessment - 08/18/18 1412      Assessment   Medical Diagnosis  Left sided CVA    Referring Provider (PT)  Octavio Graves, DO    Onset Date/Surgical Date  10/29/17    Hand Dominance  Right    Next MD Visit  7/7    Prior Therapy  yes, inpatient      Precautions   Precautions  Fall      Prior Function   Level of Independence  Needs assistance with  transfers;Needs assistance with homemaking;Needs assistance with ADLs      ROM / Strength   AROM / PROM / Strength  Strength      Strength   Overall Strength  Deficits    Overall Strength Comments  LLE: 4-/5 hip flexion, 4+/5 knee extension, 4-/5 knee flexion, 4+/5 ankle DF.  RLE: trace 1/5 knee extension.  No activation noted in sitting in hip flexors, knee flexors or ankle mm      Transfers   Transfers  Sit to Stand;Stand to  Sit;Lateral/Scoot Transfers    Sit to Stand  3: Mod assist    Sit to Stand Details (indicate cue type and reason)  from wheelchair with one UE support on sink or // bars    Sit to Stand: Patient Percentage  50%    Stand to Sit  3: Mod assist    Lateral/Scoot Transfers  4: Min assist;3: Mod assist;With armrests removed;With slide board    Lateral/Scoot Transfer Details (indicate cue type and reason)  from w/c > mat towards R hemiplegic side pt required mod A to maintain forward trunk lean and weight shift to L in order to advance hips to R.  When returning mat > w/c pt only required min A to block R knee while lateral scooting      Ambulation/Gait   Ambulation/Gait  Yes    Ambulation/Gait Assistance  2: Max assist    Ambulation/Gait Assistance Details  gait with one UE support on // bars x 3 reps (10' each) with trial AFO donned and therapist on R side providing assistance for upright trunk, fowards and anterior weight shifting over RLE, knee stability in R stance and advancement/placement of RLE during swing phase    Ambulation Distance (Feet)  10 Feet   x 3   Assistive device  Parallel bars    Gait Pattern  Step-to pattern;Step-through pattern;Decreased step length - left;Decreased stance time - right;Decreased hip/knee flexion - right;Decreased dorsiflexion - right;Decreased weight shift to right;Right flexed knee in stance;Trunk flexed    Ambulation Surface  Level;Indoor                   [facility] Adult PT Treatment/Exercise - 08/18/18 1412      Neuro  Re-ed    Neuro Re-ed Details   Performed standing at sink x 3 minutes with therapist on R side providing trunk and RLE support/stability while pt released sink with LUE and performed reaching up and to the L to place pegs on a board to facilitate upright posture and active extension through RLE to weight shift to L             PT Education - 08/19/18 0852    Education Details  progress, will recertify for more visits.  Will continue to f/u with physician regarding orders for OT/SLP and new AFO    Person(s) Educated  Patient;Child(ren)    Methods  Explanation    Comprehension  Verbalized understanding          PT Long Term Goals - 08/18/18 1407      PT LONG TERM GOAL #1   Title  Patient will be independent with HEP    Time  4    Period  Weeks    Status  Achieved      PT LONG TERM GOAL #2   Title  Patient will transfer with supervision from wheelchair to plinth with slideboard    Time  4    Period  Weeks    Status  Partially Met      PT LONG TERM GOAL #3   Title  Patient will demonstrate sit to stand transfer with min A to close supervision assist.    Time  4    Period  Weeks    Status  Partially Met      PT LONG TERM GOAL #4   Title  Patient will demonstrate standing tolerance to 2 minutes to perform functional tasks for ADLs.    Time  4  Period  Weeks    Status  Achieved      PT LONG TERM GOAL #5   Title  Patient will demonstrate 4+/5 or greater in left unaffected LE and UE to improve ability to perform functional tasks.    Time  4    Period  Weeks    Status  Partially Met      PT LONG TERM GOAL #6   Title  Patient will ambulate 40 feet or greater with min A and use of hemi walker to safely navigate around home.    Time  4    Period  Weeks    Status  Partially Met        New Goals for Recertification: PT Short Term Goals - 08/19/18 0913      PT SHORT TERM GOAL #1   Title  Pt and family will be independent with supine, seated and standing HEP     Time  4    Status  New    Target Date  09/18/18      PT SHORT TERM GOAL #2   Title  Pt will perform slideboard transfers to left and right on level surfaces with min A of family    Baseline  Mod to R, Min to L    Time  4    Period  Weeks    Status  New    Target Date  09/18/18      PT SHORT TERM GOAL #3   Title  Pt will perform bed mobility on flat mat with mod A (supine <> sit and rolling)    Time  4    Period  Weeks    Status  New    Target Date  09/18/18      PT SHORT TERM GOAL #4   Title  Pt will perform sit > stand pushing from wheelchair with mod A on R side    Time  4    Period  Weeks    Status  New    Target Date  09/18/18      PT SHORT TERM GOAL #5   Title  Pt will ambulate x 25' with RW with hand orthosis, trial AFO and mod-max A of one therapist    Time  4    Period  Weeks    Status  Revised    Target Date  09/18/18      PT Long Term Goals - 08/19/18 0916      PT LONG TERM GOAL #1   Title  Patient and family will be independent with final HEP recommendations    Time  8    Period  Weeks    Status  Revised    Target Date  10/18/18      PT LONG TERM GOAL #2   Title  Patient will perform sit <> stand from wheelchair with min A and transfer stand pivot with RW and AFO with Mod A of one person    Time  8    Period  Weeks    Status  Revised    Target Date  10/18/18      PT LONG TERM GOAL #3   Title  Pt will perform bed mobility on flat mat with min A    Time  8    Period  Weeks    Status  Revised    Target Date  10/18/18      PT LONG TERM GOAL #4   Title  Family will demonstrate independent ability to don appropriate AFO with R shoe    Time  8    Period  Weeks    Status  New    Target Date  10/18/18      PT LONG TERM GOAL #5   Title  Patient will ambulate 40 feet or greater with min A, AFO, and LRAD to safely navigate around home.    Time  8    Period  Weeks    Status  Revised    Target Date  10/18/18            Plan - 08/19/18 0856     Clinical Impression Statement  Treatment session focused on assessment of progress towards goals and revision of goals based on progress.  Pt is making slow but steady progress towards goals.  She has met 2/6 LTG, partially meeting the other 4.  She continues to require mod A to transfer with slideboard with family to the R, min A to safely transfer to the L.  She also requires mod A to stand and remain standing for functional tasks and has initiated gait training with trial AFO and UE support on // bars or RW with max A for very short distances which is currently not functional or safe for home ambulation.  Pt is very motivated to continue with therapy and is compliant with HEP with family's assistance at home.  Pt will benefit from continued skilled PT services to address the following impairments: impaired strength, motor planning, timing and sequencing on R side, impaired postural control, impaired balance, impaired transfers and gait.  Will continue to address in order to progress functional mobility independence, decrease burden of care and decrease falls risk.    Personal Factors and Comorbidities  Age;Comorbidity 3+;Time since onset of injury/illness/exacerbation    Examination-Activity Limitations  Lift;Dressing;Self Feeding;Transfers;Toileting;Stand;Locomotion Level;Reach Overhead;Bed Mobility    Rehab Potential  Fair    PT Frequency  2x / week    PT Duration  8 weeks    PT Treatment/Interventions  ADLs/Self Care Home Management;Electrical Stimulation;Gait training;Stair training;Functional mobility training;Balance training;Therapeutic exercise;Therapeutic activities;Neuromuscular re-education;Wheelchair mobility training;Manual techniques;Passive range of motion;Patient/family education;DME Instruction;Energy conservation;Orthotic Fit/Training    PT Next Visit Plan  Did order for AFO/ OT and SLP come through?  Work on decreasing assistance needed to perform level slideboard transfers to L and  R.  Work on bed mobility on flat bed.  Progress to Squat pivot transfers.  If you have skilled +2 (another PT or Vania Rea!!) Gait at // bars with [place] blue rocker AFO; gait with RW if have two people.  Add to HEP: WB exercises for mm activation    Recommended Other Services  OT and SLP    Consulted and Agree with Plan of Care  Patient;Family member/caregiver    Family Member Consulted  Son       Patient will benefit from skilled therapeutic intervention in order to improve the following deficits and impairments:  Pain, Postural dysfunction, Impaired tone, Decreased mobility, Decreased activity tolerance, Decreased range of motion, Decreased strength, Impaired UE functional use, Difficulty walking, Decreased balance, Abnormal gait, Decreased endurance  Visit Diagnosis: 1. 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 Muscle weakness (generalized)   3. Difficulty in walking, not elsewhere classified   4. Abnormal posture   5. Other symptoms and signs involving the nervous system        Problem List Patient Active Problem List  Diagnosis Date Noted  . Physical deconditioning 12/19/2017  . Chronic kidney disease (CKD), stage III (moderate) ([facility])   . Hypoalbuminemia due to protein-calorie malnutrition ([place])   . Intractable migraine without status migrainosus   . Reactive depression   . Hemiparesis affecting right side as late effect of stroke ([place])   . Slow transit constipation   . PAF (paroxysmal atrial fibrillation) ([place])   . Dysphasia, post-stroke   . COPD (chronic obstructive pulmonary disease) ([place]) 10/29/2017  . Chronic diastolic CHF (congestive heart failure) ([place]) 10/29/2017  . Hypothyroidism 10/29/2017  . Essential hypertension 10/29/2017  . GERD (gastroesophageal reflux disease) 10/29/2017  . Anxiety 10/29/2017  . 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 Hyperlipidemia 04/20/2013    Rico Junker, PT,  DPT 08/19/18    9:13 AM    [facility] [address] [place], [place], [address] Phone: [phone]   Fax:  [phone]  Name: AMARRAH MEINHART MRN: [id number] Date of Birth: April 15, 1945

## 2018-08-23 ENCOUNTER — Other Ambulatory Visit: Payer: Self-pay

## 2018-08-23 ENCOUNTER — Ambulatory Visit: Payer: Medicare Other | Admitting: Occupational Therapy

## 2018-08-23 ENCOUNTER — Encounter: Payer: Self-pay | Admitting: Physical Therapy

## 2018-08-23 ENCOUNTER — Ambulatory Visit: Payer: Medicare Other | Admitting: Speech Pathology

## 2018-08-23 ENCOUNTER — Ambulatory Visit: Payer: Medicare Other | Admitting: Physical Therapy

## 2018-08-23 DIAGNOSIS — G8929 Other chronic pain: Secondary | ICD-10-CM

## 2018-08-23 DIAGNOSIS — I69351 Hemiplegia and hemiparesis following cerebral infarction affecting right dominant side: Secondary | ICD-10-CM

## 2018-08-23 DIAGNOSIS — M25612 Stiffness of left shoulder, not elsewhere classified: Secondary | ICD-10-CM

## 2018-08-23 DIAGNOSIS — R29818 Other symptoms and signs involving the nervous system: Secondary | ICD-10-CM

## 2018-08-23 DIAGNOSIS — R41841 Cognitive communication deficit: Secondary | ICD-10-CM

## 2018-08-23 DIAGNOSIS — M6281 Muscle weakness (generalized): Secondary | ICD-10-CM | POA: Diagnosis not present

## 2018-08-23 DIAGNOSIS — R293 Abnormal posture: Secondary | ICD-10-CM

## 2018-08-23 DIAGNOSIS — R2689 Other abnormalities of gait and mobility: Secondary | ICD-10-CM

## 2018-08-23 DIAGNOSIS — R262 Difficulty in walking, not elsewhere classified: Secondary | ICD-10-CM

## 2018-08-23 DIAGNOSIS — R471 Dysarthria and anarthria: Secondary | ICD-10-CM

## 2018-08-23 NOTE — Therapy (Signed)
[facility] [address] [place], [place], [address] Phone: [phone]   Fax:  [phone]  Physical Therapy Treatment  Patient Details  Name: Holly Flores MRN: [id number] Date of Birth: 1945-02-28 Referring Provider (PT): Octavio Graves, DO  CLINIC OPERATION CHANGES: [facility] is open at lower capacity following universal masking, social distancing, and patient screening.  The patient's COVID risk of complications score is 6.  Encounter Date: 08/23/2018  PT End of Session - 08/23/18 1533    Visit Number  11    Number of Visits  26    Date for PT Re-Evaluation  10/18/18    Authorization Type  Progress note every 10th visit; KX modifier at 15th visit.    PT Start Time  1405    PT Stop Time  1447    PT Time Calculation (min)  42 min    Equipment Utilized During Treatment  Gait belt   AFO   Activity Tolerance  Patient tolerated treatment well    Behavior During Therapy  WFL for tasks assessed/performed       Past Medical History:  Diagnosis Date  . Anxiety   . Arthritis    "all over" (12/20/2017)  . Central retinal vein occlusion of left eye 01/22/2015  . CHF (congestive heart failure) ([place])   . Chronic bronchitis ([place])    "get it most years" (12/20/2017)  . Chronic lower back pain   . CKD (chronic kidney disease), stage II   . COPD (chronic obstructive pulmonary disease) ([place])   . CVA (cerebral vascular accident) ([place]) 12/30/2014   OCULAR  LEFT  EYE   . Depression   . Fibromyalgia   . GERD (gastroesophageal reflux disease)   . Goiter   . History of colonic polyps 05/14/2014  . Hyperlipidemia   . Hypertension   . Hypothyroidism   . Kidney cysts    left side  . 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 Migraine    "couple times/wk; been having them for a long time" (12/20/2017)  . Myocardial infarction [facility])    "one dr said I did; one said I didn't;   don't remember when it was" (12/20/2017)  . 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 On home oxygen therapy    "3L; sleep w/it q night" (12/20/2017)  . Pneumonia    "several times" (12/20/2017)  . Shortness of breath   . Sleep apnea    "stopped mask when I got on the oxygen" (12/20/2017)  . Stroke [facility])     Past Surgical History:  Procedure Laterality Date  . ANTERIOR CERVICAL DECOMP/DISCECTOMY FUSION    . BACK SURGERY    . BREAST BIOPSY     left-non cancerous  . CARDIAC CATHETERIZATION    . CARPAL TUNNEL RELEASE Left   . CATARACT EXTRACTION W/PHACO  09/21/2011   Procedure: CATARACT EXTRACTION PHACO AND INTRAOCULAR LENS PLACEMENT (IOC);  Surgeon: Williams Che, MD;  Location: [facility];  Service: Ophthalmology;  Laterality: Left;  CDE:9.78  . CATARACT EXTRACTION W/PHACO Right 10/15/2014   Procedure: CATARACT EXTRACTION PHACO AND INTRAOCULAR LENS PLACEMENT ([place]);  Surgeon: Williams Che, MD;  Location: [facility];  Service: Ophthalmology;  Laterality: Right;  CDE:4.19  . [place]; 1972  . CHOLECYSTECTOMY OPEN    . COLONOSCOPY  2006   RMR: 1. Internal hemorroids, otherwise normal rectum. 2. Pedunculated polyp at 35 cm. reomved with snare. The remainder of  teh colonic mucosa appeared normal.   . COLONOSCOPY  2011   Dr. Gala Romney: multiple ascending colon polyps and rectal polyp, adenomatous  . COLONOSCOPY N/A 05/31/2014   Procedure: COLONOSCOPY;  Surgeon: Daneil Dolin, MD;  Location: [facility];  Service: Endoscopy;  Laterality: N/A;  130  . COLONOSCOPY WITH PROPOFOL N/A 06/24/2017   Procedure: COLONOSCOPY WITH PROPOFOL;  Surgeon: Daneil Dolin, MD;  Location: [facility];  Service: Endoscopy;  Laterality: N/A;  2:15pm  . ESOPHAGOGASTRODUODENOSCOPY  2006   Dr. Gala Romney: Subtle Schatzkis ring, otherwise normal upper GI tract, aside from a small pyloric channell erosion, status post dilation as described above.   [facility] FLEXIBLE SIGMOIDOSCOPY N/A 11/11/2017   Procedure: FLEXIBLE  SIGMOIDOSCOPY;  Surgeon: Otis Brace, MD;  Location: [facility];  Service: Gastroenterology;  Laterality: N/A;  . JOINT REPLACEMENT    . LUMBAR DISC SURGERY  X 2  . POLYPECTOMY  06/24/2017   Procedure: POLYPECTOMY;  Surgeon: Daneil Dolin, MD;  Location: [facility];  Service: Endoscopy;;  ascending  . POSTERIOR LUMBAR FUSION  ? date 1st fusion ; 07/2006   previous L4-5 Ray threaded fusion cage; Exploration of L4-5 fusion & PLIF 07/22/2006/notes 07/22/2006  . TONSILLECTOMY    . TOTAL KNEE ARTHROPLASTY Right 05/24/2013   Procedure: TOTAL KNEE ARTHROPLASTY;  Surgeon: [name], MD;  Location: [facility];  Service: Orthopedics;  Laterality: Right;  . TOTAL KNEE ARTHROPLASTY Left   . VAGINAL HYSTERECTOMY      There were no vitals filed for this visit.  Subjective Assessment - 08/23/18 1522    Subjective  Denies any falls or changes.  Still having some R groin discomfort at times.  None at present.    Patient is accompained by:  Family member    Pertinent History  left sided CVA 10/29/17, Chronic CHF, AFib, COPD, Chronic Kidney Disease stage III, hx of bil TKA, Lumbar surgery    Limitations  Lifting;Standing;Walking;House hold activities    Diagnostic tests  MRI: left external capsule CVA    Patient Stated Goals  move around better in the home and improve function    Currently in Pain?  No/denies    Pain Onset  More than a month ago          [facility] Adult PT Treatment/Exercise - 08/23/18 0001      Bed Mobility   Bed Mobility  Rolling Right;Rolling Left;Left Sidelying to Sit    Rolling Right  Moderate Assistance - Patient 50-74%    Rolling Left  Maximal Assistance - Patient 25-49%    Left Sidelying to Sit  Moderate Assistance - Patient 50-74%    Sitting - Scoot to Edge of Bed  Moderate Assistance - Patient 50-74%      Transfers   Transfers  Sit to Stand;Stand to Sit;Lateral/Scoot Transfers    Sit to Stand  3: Mod assist    Sit to Stand Details  Manual facilitation for weight  shifting;Manual facilitation for placement;Manual facilitation for weight bearing    Sit to Stand Details (indicate cue type and reason)  from to pull up to standing at steady    Sit to Stand: Patient Percentage  50%    Stand to Sit  3: Mod assist    Stand to Sit Details (indicate cue type and reason)  Manual facilitation for weight shifting;Manual facilitation for placement    Stand to Sit Details  from standing at steady to sitting on mat    Lateral/Scoot Transfers  3: Mod assist;With armrests removed;With slide board    Lateral/Scoot Transfer Details (indicate cue type and reason)  w/c<>mat with mod assist to both sides.  Needs assist with all aspects of transfer.  Slightly less assist needed when transfering to the L.      Comments  Max assist to scoot back in w/c after transfer      Ambulation/Gait   Pre-Gait Activities  Standing at steady x 3 times x 3 minutes with moderate assist on R side for hip and knee control.  Pt able to initiate activation of the R hip and knee but fatigues quickly.  R knee supported by PTA.      Lumbar Exercises: Supine   Clam  10 reps;1 second;Limitations   moderate assist for RLE   Bridge  Non-compliant;10 reps;1 second    Single Leg Bridge  Non-compliant;10 reps;1 second;Limitations   limited AROM and facilitation for recruitment       PT Short Term Goals - 08/19/18 0913      PT SHORT TERM GOAL #1   Title  Pt and family will be independent with supine, seated and standing HEP    Time  4    Status  New    Target Date  09/18/18      PT SHORT TERM GOAL #2   Title  Pt will perform slideboard transfers to left and right on level surfaces with min A of family    Baseline  Mod to R, Min to L    Time  4    Period  Weeks    Status  New    Target Date  09/18/18      PT SHORT TERM GOAL #3   Title  Pt will perform bed mobility on flat mat with mod A (supine <> sit and rolling)    Time  4    Period  Weeks    Status  New    Target Date  09/18/18       PT SHORT TERM GOAL #4   Title  Pt will perform sit > stand pushing from wheelchair with mod A on R side    Time  4    Period  Weeks    Status  New    Target Date  09/18/18      PT SHORT TERM GOAL #5   Title  Pt will ambulate x 25' with RW with hand orthosis, trial AFO and mod-max A of one therapist    Time  4    Period  Weeks    Status  Revised    Target Date  09/18/18        PT Long Term Goals - 08/19/18 0916      PT LONG TERM GOAL #1   Title  Patient and family will be independent with final HEP recommendations    Time  8    Period  Weeks    Status  Revised    Target Date  10/18/18      PT LONG TERM GOAL #2   Title  Patient will perform sit <> stand from wheelchair with min A and transfer stand pivot with RW and AFO with Mod A of one person    Time  8    Period  Weeks    Status  Revised    Target Date  10/18/18      PT LONG TERM GOAL #3   Title  Pt will perform bed mobility on flat mat with  min A    Time  8    Period  Weeks    Status  Revised    Target Date  10/18/18      PT LONG TERM GOAL #4   Title  Family will demonstrate independent ability to don appropriate AFO with R shoe    Time  8    Period  Weeks    Status  New    Target Date  10/18/18      PT LONG TERM GOAL #5   Title  Patient will ambulate 40 feet or greater with min A, AFO, and LRAD to safely navigate around home.    Time  8    Period  Weeks    Status  Revised    Target Date  10/18/18            Plan - 08/23/18 1534    Clinical Impression Statement  Skilled session focused on bed mobility, strengthening and static standing.  Pt motivated to improve mobility and works well during session.  Was able to initiate some R knee and hip extension today at steady with verbal and tactile cues.  Continue PT per POC.    Personal Factors and Comorbidities  Age;Comorbidity 3+;Time since onset of injury/illness/exacerbation    Examination-Activity Limitations  Lift;Dressing;Self  Feeding;Transfers;Toileting;Stand;Locomotion Level;Reach Overhead;Bed Mobility    Rehab Potential  Fair    PT Frequency  2x / week    PT Duration  8 weeks    PT Treatment/Interventions  ADLs/Self Care Home Management;Electrical Stimulation;Gait training;Stair training;Functional mobility training;Balance training;Therapeutic exercise;Therapeutic activities;Neuromuscular re-education;Wheelchair mobility training;Manual techniques;Passive range of motion;Patient/family education;DME Instruction;Energy conservation;Orthotic Fit/Training    PT Next Visit Plan  Work on decreasing assistance needed to perform level slideboard transfers to L and R.  Work on bed mobility on flat bed.  Progress to Squat pivot transfers.  If you have skilled +2 (another PT or Vania Rea!!) Gait at // bars with [place] blue rocker AFO; gait with RW if have two people.  Add to HEP: WB exercises for mm activation    Consulted and Agree with Plan of Care  Patient;Family member/caregiver    Family Member Consulted  Son       Patient will benefit from skilled therapeutic intervention in order to improve the following deficits and impairments:  Pain, Postural dysfunction, Impaired tone, Decreased mobility, Decreased activity tolerance, Decreased range of motion, Decreased strength, Impaired UE functional use, Difficulty walking, Decreased balance, Abnormal gait, Decreased endurance  Visit Diagnosis: 1. 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 Muscle weakness (generalized)   3. Difficulty in walking, not elsewhere classified   4. Abnormal posture   5. Other symptoms and signs involving the nervous system        Problem List Patient Active Problem List   Diagnosis Date Noted  . Physical deconditioning 12/19/2017  . Chronic kidney disease (CKD), stage III (moderate) ([facility])   . Hypoalbuminemia due to protein-calorie malnutrition ([place])   . Intractable migraine without status  migrainosus   . Reactive depression   . Hemiparesis affecting right side as late effect of stroke ([place])   . Slow transit constipation   . PAF (paroxysmal atrial fibrillation) ([place])   . Dysphasia, post-stroke   . COPD (chronic obstructive pulmonary disease) ([place]) 10/29/2017  . Chronic diastolic CHF (congestive heart failure) ([place]) 10/29/2017  . Hypothyroidism 10/29/2017  . Essential hypertension 10/29/2017  . GERD (gastroesophageal reflux disease) 10/29/2017  . Anxiety 10/29/2017  .  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 Hyperlipidemia 04/20/2013   Narda Bonds, PTA [facility] 08/23/18 3:37 PM Phone: [phone] Fax: [facility] [address] [place], [place], [address] Phone: [phone]   Fax:  [phone]  Name: Holly Flores MRN: [id number] Date of Birth: Mar 07, 1945

## 2018-08-23 NOTE — Therapy (Signed)
[facility] [address] [place], [place], [address] Phone: [phone]   Fax:  [phone]  Occupational Therapy Evaluation  Patient Details  Name: Holly Flores MRN: [id number] Date of Birth: Dec 28, 1945 Referring Provider (OT): Dr. Octavio Graves   Encounter Date: 08/23/2018  OT End of Session - 08/23/18 1608    Visit Number  1    Number of Visits  16    Date for OT Re-Evaluation  10/24/18    Authorization Type  [employer] primary, [employer] secondary    Authorization - Visit Number  1    Authorization - Number of Visits  10    OT Start Time  1500    OT Stop Time  1545    OT Time Calculation (min)  45 min    Activity Tolerance  Patient tolerated treatment well    Behavior During Therapy  [facility] for tasks assessed/performed       Past Medical History:  Diagnosis Date  . Anxiety   . Arthritis    "all over" (12/20/2017)  . Central retinal vein occlusion of left eye 01/22/2015  . CHF (congestive heart failure) ([place])   . Chronic bronchitis ([place])    "get it most years" (12/20/2017)  . Chronic lower back pain   . CKD (chronic kidney disease), stage II   . COPD (chronic obstructive pulmonary disease) ([place])   . CVA (cerebral vascular accident) ([place]) 12/30/2014   OCULAR  LEFT  EYE   . Depression   . Fibromyalgia   . GERD (gastroesophageal reflux disease)   . Goiter   . History of colonic polyps 05/14/2014  . Hyperlipidemia   . Hypertension   . Hypothyroidism   . Kidney cysts    left side  . 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 Migraine    "couple times/wk; been having them for a long time" (12/20/2017)  . Myocardial infarction [facility])    "one dr said I did; one said I didn't;  don't remember when it was" (12/20/2017)  . 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 On home oxygen therapy    "3L; sleep w/it q night" (12/20/2017)  . Pneumonia    "several times" (12/20/2017)  . Shortness of  breath   . Sleep apnea    "stopped mask when I got on the oxygen" (12/20/2017)  . Stroke [facility])     Past Surgical History:  Procedure Laterality Date  . ANTERIOR CERVICAL DECOMP/DISCECTOMY FUSION    . BACK SURGERY    . BREAST BIOPSY     left-non cancerous  . CARDIAC CATHETERIZATION    . CARPAL TUNNEL RELEASE Left   . CATARACT EXTRACTION W/PHACO  09/21/2011   Procedure: CATARACT EXTRACTION PHACO AND INTRAOCULAR LENS PLACEMENT (IOC);  Surgeon: Williams Che, MD;  Location: [facility];  Service: Ophthalmology;  Laterality: Left;  CDE:9.78  . CATARACT EXTRACTION W/PHACO Right 10/15/2014   Procedure: CATARACT EXTRACTION PHACO AND INTRAOCULAR LENS PLACEMENT ([place]);  Surgeon: Williams Che, MD;  Location: [facility];  Service: Ophthalmology;  Laterality: Right;  CDE:4.19  . [place]; 1972  . CHOLECYSTECTOMY OPEN    . COLONOSCOPY  2006   RMR: 1. Internal hemorroids, otherwise normal rectum. 2. Pedunculated polyp at 35 cm. reomved with snare. The remainder of teh colonic mucosa appeared normal.   . COLONOSCOPY  2011   Dr. Gala Romney: multiple ascending colon polyps and rectal polyp, adenomatous  . COLONOSCOPY N/A 05/31/2014  Procedure: COLONOSCOPY;  Surgeon: Daneil Dolin, MD;  Location: [facility];  Service: Endoscopy;  Laterality: N/A;  130  . COLONOSCOPY WITH PROPOFOL N/A 06/24/2017   Procedure: COLONOSCOPY WITH PROPOFOL;  Surgeon: Daneil Dolin, MD;  Location: [facility];  Service: Endoscopy;  Laterality: N/A;  2:15pm  . ESOPHAGOGASTRODUODENOSCOPY  2006   Dr. Gala Romney: Subtle Schatzkis ring, otherwise normal upper GI tract, aside from a small pyloric channell erosion, status post dilation as described above.   Marland Kitchen FLEXIBLE SIGMOIDOSCOPY N/A 11/11/2017   Procedure: FLEXIBLE SIGMOIDOSCOPY;  Surgeon: Otis Brace, MD;  Location: [facility];  Service: Gastroenterology;  Laterality: N/A;  . JOINT REPLACEMENT    . LUMBAR DISC SURGERY  X 2  . POLYPECTOMY  06/24/2017   Procedure:  POLYPECTOMY;  Surgeon: Daneil Dolin, MD;  Location: [facility];  Service: Endoscopy;;  ascending  . POSTERIOR LUMBAR FUSION  ? date 1st fusion ; 07/2006   previous L4-5 Ray threaded fusion cage; Exploration of L4-5 fusion & PLIF 07/22/2006/notes 07/22/2006  . TONSILLECTOMY    . TOTAL KNEE ARTHROPLASTY Right 05/24/2013   Procedure: TOTAL KNEE ARTHROPLASTY;  Surgeon: [name], MD;  Location: [facility];  Service: Orthopedics;  Laterality: Right;  . TOTAL KNEE ARTHROPLASTY Left   . VAGINAL HYSTERECTOMY      There were no vitals filed for this visit.  Subjective Assessment - 08/23/18 1458    Pertinent History  CVA 10/2017. PMH: CHF, COPD, CKD (Stage II), fibromyalgia, depression. (Husband, who is primary caregiver, also has cancer)    Limitations  obesity, needs DME to transfer (sliding board or stand assist)    Currently in Pain?  Yes    Pain Score  6     Pain Location  Arm    Pain Orientation  Right    Pain Descriptors / Indicators  Sore    Pain Type  Chronic pain    Pain Onset  More than a month ago    Pain Frequency  Intermittent    Aggravating Factors   at night, pulling arm up    Pain Relieving Factors  massage, topical rub        [facility] OT Assessment - 08/23/18 0001      Assessment   Medical Diagnosis  Left sided CVA    Referring Provider (OT)  Dr. Octavio Graves    Onset Date/Surgical Date  10/29/17    Hand Dominance  Right    Prior Therapy  yes, inpatient      Precautions   Precautions  Fall      Balance Screen   Has the patient fallen in the past 6 months  --   see PT eval     Home  Environment   Bathroom Shower/Tub  Walk-in Shower;Curtain   grab bars and shower chair   Additional Comments  Pt lives w/ husband in 1 story home with ramp to enter. DME includes: hospital bed, w/c, BSC, shower chair, and hoyer lift    Lives With  Spouse      Prior Function   Level of Independence  Independent   prior to CVA 10/29/17   Vocation  Retired      ADL    Eating/Feeding  Needs assist with cutting food   using Lt non dominant hand   Grooming  Moderate assistance   assist w/ brushing hair (d/t decr Lt sh ROM as well)   Upper Body Bathing  Maximal assistance    Lower Body  Bathing  Maximal assistance    Upper Body Dressing  Moderate assistance;Maximal assistance   mod to max assist   Lower Body Dressing  +1 Total aassistance    ADL comments  Husband or daughter gets pt in shower approx 2x/wk, then sponge bathes between days      IADL   Shopping  Completely unable to shop    Light Housekeeping  Does not participate in any housekeeping tasks   husband performing   Meal Prep  Needs to have meals prepared and served    [employer] on family or friends for transportation      Mobility   Mobility Status  Needs assist    Mobility Status Comments  w/c dependent, walks a few steps with 2 person assist at home      Written Expression   Dominant Hand  Right      Vision - History   Additional Comments  reports diplopia for about 2 months, pt reports mild changes in vision from stroke in Fall 2019 but seemed to resolve      Cognition   Overall Cognitive Status  Cognition to be further assessed in functional context PRN    Cognition Comments  Pt reports some memory changes from stroke      Observation/Other Assessments   Observations  Pt with no RUE movement (except mild shoulder shrug), LUE: sh ROM limited. Requires mod to max assist for sliding board transfers. Completely dependent for transfers w/o use of sliding board.  Pt's husband also has cancer and decreased strength to transfer patient      Posture/Postural Control   Posture/Postural Control  Postural limitations    Postural Limitations  Posterior pelvic tilt      Sensation   Light Touch  Appears Intact      Coordination   Coordination  Pt has no movement Rt hand      Edema   Edema  mild to moderate Rt hand      Tone   Assessment Location  Right Upper Extremity       ROM / Strength   AROM / PROM / Strength  AROM;PROM      AROM   Overall AROM Comments  RUE: no active movement (minimal scapula movement only). LUE shoulder limitations (? etiology) as follows: flex approx 80*, abd. 50-60*, ER approx 50%, IR apporx 75%. Elbow distally WFL's      PROM   Overall PROM Comments  RUE: sh flexion to approx 110* w/ pain as ROM increases, abduction to approx 120*, elbow WFL's, wrist WFL's, gross finger flexion approx 90% w/ tightness at MP joints      RUE Tone   RUE Tone  Flaccid                        OT Short Term Goals - 08/23/18 1614      OT SHORT TERM GOAL #1   Title  Independent with HEP for RUE (Caregiver assisted and self ROM as able)    Time  4    Period  Weeks    Status  New      OT SHORT TERM GOAL #2   Title  Pt/family to verbalize understanding with A/E (rocker knife, adapted shoes, extended brush, LH sponge, etc) to increase independence with BADLS    Time  4    Period  Weeks    Status  New      OT SHORT TERM  GOAL #3   Title  Pt/family to verbalize understanding with use of stand assist (Steady) with toileting and transfers to decrease burden of care on caregiver    Time  4    Period  Weeks    Status  New      OT SHORT TERM GOAL #4   Title  Pt to demo sliding board transfers consistently w/ mod assist or less for level transfer from w/c to/from bed    Time  4    Period  Weeks    Status  New        OT Long Term Goals - 08/23/18 1618      OT LONG TERM GOAL #1   Title  Pt to demo independence with HEP for Lt shoulder (once pt seen by ortho MD)    Time  8    Period  Weeks    Status  New      OT LONG TERM GOAL #2   Title  Pt to be min assist for UE dressing and bathing    Time  8    Period  Weeks    Status  New      OT LONG TERM GOAL #3   Title  Pt to cut food and brush hair w/ A/E independently    Time  8    Period  Weeks    Status  New      OT LONG TERM GOAL #4   Title  Pt to perform toilet  transfer w/ mod assist using correct DME prn    Time  8    Period  Weeks    Status  New      OT LONG TERM GOAL #5   Title  Pt to perform LE dressing at max assist, LE bathing at min assist w/ use of LH sponge    Time  8    Period  Weeks    Status  New            Plan - 08/23/18 1609    Clinical Impression Statement  Pt is a 73 y.o. female who presents to outpatient O.T. s/p Lt CVA in September 2019 with significant residual physical limitations including flaccid RUE, total assist for transfers unless using sliding board, mod assist to total dependence for all BADLS. Pt also with impaired LUE shoulder ROM which limits her ability for grooming and UE dressing/bathing.    OT Occupational Profile and History  Detailed Assessment- Review of Records and additional review of physical, cognitive, psychosocial history related to current functional performance    Occupational performance deficits (Please refer to evaluation for details):  ADL's;IADL's;Rest and Sleep    Body Structure / Function / Physical Skills  ADL;ROM;Vision;IADL;Edema;Body mechanics;Endurance;Mobility;Strength;Coordination;Obesity;UE functional use;Pain;Decreased knowledge of use of DME    Rehab Potential  Fair    Clinical Decision Making  Several treatment options, min-mod task modification necessary    Comorbidities Affecting Occupational Performance:  Presence of comorbidities impacting occupational performance    Comorbidities impacting occupational performance description:  obesity, CHF, COPD, Lt shoulder limitations    Modification or Assistance to Complete Evaluation   Min-Moderate modification of tasks or assist with assess necessary to complete eval    OT Frequency  2x / week    OT Duration  8 weeks    OT Treatment/Interventions  Self-care/ADL training;Therapeutic exercise;Functional Mobility Training;Neuromuscular education;Splinting;Manual Therapy;Therapeutic activities;DME and/or AE instruction;Aquatic  Therapy;Passive range of motion;Patient/family education;Visual/perceptual remediation/compensation    Plan  ask family member  about inside of doorframe measurements for bathroom and bedroom, and if they can call [facility] to see if insurance would cover steady (stand assist lift), initiate HEP for RUE, also discuss referral to ortho MD to address Lt shoulder    Consulted and Agree with Plan of Care  Patient       Patient will benefit from skilled therapeutic intervention in order to improve the following deficits and impairments:   Body Structure / Function / Physical Skills: ADL, ROM, Vision, IADL, Edema, Body mechanics, Endurance, Mobility, Strength, Coordination, Obesity, UE functional use, Pain, Decreased knowledge of use of DME       Visit Diagnosis: 1. 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 Muscle weakness (generalized)   3. Abnormal posture   4. Chronic right shoulder pain   5. Other symptoms and signs involving the nervous system   6. Stiffness of left shoulder, not elsewhere classified   7. Other abnormalities of gait and mobility       Problem List Patient Active Problem List   Diagnosis Date Noted  . Physical deconditioning 12/19/2017  . Chronic kidney disease (CKD), stage III (moderate) ([facility])   . Hypoalbuminemia due to protein-calorie malnutrition ([place])   . Intractable migraine without status migrainosus   . Reactive depression   . Hemiparesis affecting right side as late effect of stroke ([place])   . Slow transit constipation   . PAF (paroxysmal atrial fibrillation) ([place])   . Dysphasia, post-stroke   . COPD (chronic obstructive pulmonary disease) ([place]) 10/29/2017  . Chronic diastolic CHF (congestive heart failure) ([place]) 10/29/2017  . Hypothyroidism 10/29/2017  . Essential hypertension 10/29/2017  . GERD (gastroesophageal reflux disease) 10/29/2017  . Anxiety 10/29/2017  . DJD (degenerative joint disease) of knee  05/24/2013  . Hyperlipidemia 04/20/2013    Carey Bullocks, OTR/L 08/23/2018, 4:29 PM  [facility] [address] [place], [place], [address] Phone: [phone]   Fax:  [phone]  Name: Holly Flores MRN: [id number] Date of Birth: 21-Jul-1945

## 2018-08-24 ENCOUNTER — Encounter: Payer: Medicare Other | Admitting: Occupational Therapy

## 2018-08-24 NOTE — Therapy (Signed)
[facility] [address] [place], [place], [address] Phone: [phone]   Fax:  [phone]  Speech Language Pathology Evaluation  Patient Details  Name: Holly Flores MRN: [id number] Date of Birth: 04/25/1945 Referring Provider (SLP): Lubertha South. Melina Copa, DO   Encounter Date: 08/23/2018  End of Session - 08/24/18 0826    Visit Number  1    Number of Visits  9    Date for SLP Re-Evaluation  10/23/18    SLP Start Time  1603    SLP Stop Time   7341    SLP Time Calculation (min)  43 min    Activity Tolerance  Patient tolerated treatment well       Past Medical History:  Diagnosis Date  . Anxiety   . Arthritis    "all over" (12/20/2017)  . Central retinal vein occlusion of left eye 01/22/2015  . CHF (congestive heart failure) ([place])   . Chronic bronchitis ([place])    "get it most years" (12/20/2017)  . Chronic lower back pain   . CKD (chronic kidney disease), stage II   . COPD (chronic obstructive pulmonary disease) ([place])   . CVA (cerebral vascular accident) ([place]) 12/30/2014   OCULAR  LEFT  EYE   . Depression   . Fibromyalgia   . GERD (gastroesophageal reflux disease)   . Goiter   . History of colonic polyps 05/14/2014  . Hyperlipidemia   . Hypertension   . Hypothyroidism   . Kidney cysts    left side  . 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 Migraine    "couple times/wk; been having them for a long time" (12/20/2017)  . Myocardial infarction [facility])    "one dr said I did; one said I didn't;  don't remember when it was" (12/20/2017)  . 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 On home oxygen therapy    "3L; sleep w/it q night" (12/20/2017)  . Pneumonia    "several times" (12/20/2017)  . Shortness of breath   . Sleep apnea    "stopped mask when I got on the oxygen" (12/20/2017)  . Stroke [facility])     Past Surgical History:  Procedure Laterality Date  . ANTERIOR CERVICAL  DECOMP/DISCECTOMY FUSION    . BACK SURGERY    . BREAST BIOPSY     left-non cancerous  . CARDIAC CATHETERIZATION    . CARPAL TUNNEL RELEASE Left   . CATARACT EXTRACTION W/PHACO  09/21/2011   Procedure: CATARACT EXTRACTION PHACO AND INTRAOCULAR LENS PLACEMENT (IOC);  Surgeon: Williams Che, MD;  Location: [facility];  Service: Ophthalmology;  Laterality: Left;  CDE:9.78  . CATARACT EXTRACTION W/PHACO Right 10/15/2014   Procedure: CATARACT EXTRACTION PHACO AND INTRAOCULAR LENS PLACEMENT ([place]);  Surgeon: Williams Che, MD;  Location: [facility];  Service: Ophthalmology;  Laterality: Right;  CDE:4.19  . [place]; 1972  . CHOLECYSTECTOMY OPEN    . COLONOSCOPY  2006   RMR: 1. Internal hemorroids, otherwise normal rectum. 2. Pedunculated polyp at 35 cm. reomved with snare. The remainder of teh colonic mucosa appeared normal.   . COLONOSCOPY  2011   Dr. Gala Romney: multiple ascending colon polyps and rectal polyp, adenomatous  . COLONOSCOPY N/A 05/31/2014   Procedure: COLONOSCOPY;  Surgeon: Daneil Dolin, MD;  Location: [facility];  Service: Endoscopy;  Laterality: N/A;  130  . COLONOSCOPY WITH PROPOFOL N/A 06/24/2017   Procedure: COLONOSCOPY WITH PROPOFOL;  Surgeon:  Rourk, Cristopher Estimable, MD;  [facility];  Service: Endoscopy;  Laterality: N/A;  2:15pm  . ESOPHAGOGASTRODUODENOSCOPY  2006   Dr. Gala Romney: Subtle Schatzkis ring, otherwise normal upper GI tract, aside from a small pyloric channell erosion, status post dilation as described above.   Marland Kitchen FLEXIBLE SIGMOIDOSCOPY N/A 11/11/2017   Procedure: FLEXIBLE SIGMOIDOSCOPY;  Surgeon: Otis Brace, MD;  Location: [facility];  Service: Gastroenterology;  Laterality: N/A;  . JOINT REPLACEMENT    . LUMBAR DISC SURGERY  X 2  . POLYPECTOMY  06/24/2017   Procedure: POLYPECTOMY;  Surgeon: Daneil Dolin, MD;  [facility]: [facility];  Service: Endoscopy;;  ascending  . POSTERIOR LUMBAR FUSION  ? date 1st fusion ; 07/2006   previous L4-5 Ray  threaded fusion cage; Exploration of L4-5 fusion & PLIF 07/22/2006/notes 07/22/2006  . TONSILLECTOMY    . TOTAL KNEE ARTHROPLASTY Right 05/24/2013   Procedure: TOTAL KNEE ARTHROPLASTY;  Surgeon: [name], MD;  Location: [facility];  Service: Orthopedics;  Laterality: Right;  . TOTAL KNEE ARTHROPLASTY Left   . VAGINAL HYSTERECTOMY      There were no vitals filed for this visit.  Subjective Assessment - 08/23/18 1604    Subjective  "Can you help with my voice being too low?"    Currently in Pain?  Yes    Pain Score  6     Pain Location  Arm    Pain Orientation  Right    Pain Descriptors / Indicators  Sore         SLP Evaluation [facility] - 08/24/18 0001      SLP Visit Information   SLP Received On  08/23/18    Referring Provider (SLP)  Lubertha South. Melina Copa, DO    Onset Date  10/29/17    Medical Diagnosis  L CVA      Subjective   Subjective  Pt reports she "graduated" from speech therapy in rehab      Pain Assessment   Currently in Pain?  Yes    Pain Score  6     Pain Location  Arm    Pain Orientation  Right      General Information   HPI  CVA 10/2017. PMH: CHF, COPD, CKD (Stage II), fibromyalgia, depression. 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   Overall Cognitive Status  Within Functional Limits for tasks assessed   [place] 26/30, 26 and above is considered [place]  --   pt reports mild forgetfulness, baseline     Auditory Comprehension   Overall Auditory Comprehension  Appears within functional limits for tasks assessed      Visual Recognition/Discrimination   Discrimination  Within Function Limits       Reading Comprehension   Reading Status  Within funtional limits      Expression   Primary Mode of Expression  Verbal      Verbal Expression   Overall Verbal Expression  Appears within functional limits for tasks  assessed      Written Expression   Dominant Hand  Right   now right hemi   Written Expression  Not tested      Oral Motor/Sensory Function   Overall Oral Motor/Sensory Function  Other (comment)   not formally assessed due to masking     Motor Speech   Overall Motor Speech  Impaired    Respiration  Impaired    Level of Impairment  Conversation    Phonation  Low vocal intensity;Hoarse   gravelly   Resonance  Within functional limits    Articulation  Within functional limitis    Intelligibility  Intelligible   in quiet environment   Motor Planning  Witnin functional limits    Phonation  Impaired    Volume  Soft    Pitch  Low      Standardized Assessments   Standardized Assessments   Montreal Cognitive Assessment (MOCA);Other Assessment   VRQOL   Montreal Cognitive Assessment (MOCA)   26/30, WNL MOCA Basic    Other Assessment  Modal pitch is 138.6 Hz, range 110-185 Hz, low for age/gender (normal range for females is 165-255 Hz). Pitch glides with highest pitch for /a/ 261.6 Hz. Vocal intensity is reduced, average 66dB at 30cm in 3 minutes simple conversation. Intelligibility is not affected in a quiet environment. Pt's calculated score on Voice-Related Quality of Life scale is 65 (>85 is WNL).                           SLP Long Term Goals - 08/24/18 0827      SLP LONG TERM GOAL #1   Title  Pt will complete HEP for dysarthria/voice with mod I x 3 sessions    Time  4    Period  Weeks    Status  New      SLP LONG TERM GOAL #2   Title  Pt will use abdominal breathing in 8 min simple conversation >80% of the time x 2 visits    Time  4    Period  Weeks    Status  New      SLP LONG TERM GOAL #3   Title  Pt will achieve modal pitch within normal  range (165-255Hz ) in 5 minutes simple conversation x 2 visits.    Time  4    Period  Weeks    Status  New      SLP LONG TERM GOAL #4   Title  Pt will report fewer requests from husband to repeat herself with compensations and environmental adjustments (closer proximity, lighting, background noise, etc) x 2 sessions.    Time  4    Period  Weeks    Status  New       Plan - 08/24/18 2725    Clinical Impression Statement  Mrs. 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 She compensates for articulation deficits quite well, and intelligibility is not affected for this trained listener in a quiet environment. Pt's vocal quality is hoarse and rough, her vocal intensity is reduced and her pitch is below normal for gender. She reports she feels embarrassed by her lower pitched voice since her stroke. Pt reports requests from her husband (who is deaf in one ear) to repeat herself, but otherwise intelligibility is not a major concern for her. Cognition assessed via MOCA-Basic. Pt scored within normal limits (26/30). I recommend brief course of ST to train pt in  abdominal breathing, pitch control techniques, and environmental modifications to improve her communication at home and in the community.    Speech Therapy Frequency  2x / week    Duration  4 weeks    Treatment/Interventions  Compensatory strategies;Functional tasks;Cueing hierarchy;Patient/family education;Environmental controls;Compensatory techniques;SLP instruction and feedback    Potential to Achieve Goals  Good    Consulted and Agree with Plan of Care  Patient       Patient will benefit from skilled therapeutic intervention in order to improve the following deficits and impairments:   1. Dysarthria and anarthria   2. Cognitive communication deficit       Problem List Patient Active Problem List   Diagnosis Date Noted  . Physical deconditioning 12/19/2017  . Chronic kidney disease (CKD), stage III  (moderate) ([facility])   . Hypoalbuminemia due to protein-calorie malnutrition ([place])   . Intractable migraine without status migrainosus   . Reactive depression   . Hemiparesis affecting right side as late effect of stroke ([place])   . Slow transit constipation   . PAF (paroxysmal atrial fibrillation) ([place])   . Dysphasia, post-stroke   . COPD (chronic obstructive pulmonary disease) ([place]) 10/29/2017  . Chronic diastolic CHF (congestive heart failure) ([place]) 10/29/2017  . Hypothyroidism 10/29/2017  . Essential hypertension 10/29/2017  . GERD (gastroesophageal reflux disease) 10/29/2017  . Anxiety 10/29/2017  . 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 Hyperlipidemia 04/20/2013   Deneise Lever, [place], [place] Speech-Language Pathologist  Aliene Altes 08/24/2018, 8:35 AM  [facility] [address] [place], [place], [address] Phone: [phone]   Fax:  [phone]  Name: Holly Flores MRN: [id number] Date of Birth: 16-May-1945

## 2018-08-26 ENCOUNTER — Other Ambulatory Visit: Payer: Self-pay

## 2018-08-26 ENCOUNTER — Ambulatory Visit: Payer: Medicare Other | Admitting: Physical Therapy

## 2018-08-26 ENCOUNTER — Encounter: Payer: Self-pay | Admitting: Physical Therapy

## 2018-08-26 DIAGNOSIS — R29818 Other symptoms and signs involving the nervous system: Secondary | ICD-10-CM

## 2018-08-26 DIAGNOSIS — R293 Abnormal posture: Secondary | ICD-10-CM

## 2018-08-26 DIAGNOSIS — M6281 Muscle weakness (generalized): Secondary | ICD-10-CM | POA: Diagnosis not present

## 2018-08-26 DIAGNOSIS — R262 Difficulty in walking, not elsewhere classified: Secondary | ICD-10-CM

## 2018-08-26 DIAGNOSIS — I69351 Hemiplegia and hemiparesis following cerebral infarction affecting right dominant side: Secondary | ICD-10-CM

## 2018-08-26 NOTE — Therapy (Signed)
[facility] [address] [place], [place], [address] Phone: [phone]   Fax:  [phone]  Physical Therapy Treatment  Patient Details  Name: Holly Flores MRN: [id number] Date of Birth: 1945-10-07 Referring Provider (PT): Octavio Graves, DO   Encounter Date: 08/26/2018   CLINIC OPERATION CHANGES: [facility]patient [facility] is open at lower capacity following universal masking, social distancing, and patient screening.  The patient's COVID risk of complications score is 6.   PT End of Session - 08/26/18 1551    Visit Number  12    Number of Visits  26    Date for PT Re-Evaluation  10/18/18    Authorization Type  Progress note every 10th visit; KX modifier at 15th visit.    PT Start Time  1405    PT Stop Time  1455    PT Time Calculation (min)  50 min    Equipment Utilized During Treatment  Other (comment)   STEADY, AFO   Activity Tolerance  Patient tolerated treatment well    Behavior During Therapy  WFL for tasks assessed/performed       Past Medical History:  Diagnosis Date  . Anxiety   . Arthritis    "all over" (12/20/2017)  . Central retinal vein occlusion of left eye 01/22/2015  . CHF (congestive heart failure) ([place])   . Chronic bronchitis ([place])    "get it most years" (12/20/2017)  . Chronic lower back pain   . CKD (chronic kidney disease), stage II   . COPD (chronic obstructive pulmonary disease) ([place])   . CVA (cerebral vascular accident) ([place]) 12/30/2014   OCULAR  LEFT  EYE   . Depression   . Fibromyalgia   . GERD (gastroesophageal reflux disease)   . Goiter   . History of colonic polyps 05/14/2014  . Hyperlipidemia   . Hypertension   . Hypothyroidism   . Kidney cysts    left side  . 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 Migraine    "couple times/wk; been having them for a long time" (12/20/2017)  . Myocardial infarction [facility])    "one dr said I  did; one said I didn't;  don't remember when it was" (12/20/2017)  . 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 On home oxygen therapy    "3L; sleep w/it q night" (12/20/2017)  . Pneumonia    "several times" (12/20/2017)  . Shortness of breath   . Sleep apnea    "stopped mask when I got on the oxygen" (12/20/2017)  . Stroke [facility])     Past Surgical History:  Procedure Laterality Date  . ANTERIOR CERVICAL DECOMP/DISCECTOMY FUSION    . BACK SURGERY    . BREAST BIOPSY     left-non cancerous  . CARDIAC CATHETERIZATION    . CARPAL TUNNEL RELEASE Left   . CATARACT EXTRACTION W/PHACO  09/21/2011   Procedure: CATARACT EXTRACTION PHACO AND INTRAOCULAR LENS PLACEMENT (IOC);  Surgeon: Williams Che, MD;  Location: [facility];  Service: Ophthalmology;  Laterality: Left;  CDE:9.78  . CATARACT EXTRACTION W/PHACO Right 10/15/2014   Procedure: CATARACT EXTRACTION PHACO AND INTRAOCULAR LENS PLACEMENT ([place]);  Surgeon: Williams Che, MD;  Location: [facility];  Service: Ophthalmology;  Laterality: Right;  CDE:4.19  . [place]; 1972  . CHOLECYSTECTOMY OPEN    . COLONOSCOPY  2006   RMR: 1. Internal hemorroids, otherwise normal rectum. 2. Pedunculated polyp at 35 cm. reomved with  snare. The remainder of teh colonic mucosa appeared normal.   . COLONOSCOPY  2011   Dr. Gala Romney: multiple ascending colon polyps and rectal polyp, adenomatous  . COLONOSCOPY N/A 05/31/2014   Procedure: COLONOSCOPY;  Surgeon: Daneil Dolin, MD;  Location: [facility];  Service: Endoscopy;  Laterality: N/A;  130  . COLONOSCOPY WITH PROPOFOL N/A 06/24/2017   Procedure: COLONOSCOPY WITH PROPOFOL;  Surgeon: Daneil Dolin, MD;  Location: [facility];  Service: Endoscopy;  Laterality: N/A;  2:15pm  . ESOPHAGOGASTRODUODENOSCOPY  2006   Dr. Gala Romney: Subtle Schatzkis ring, otherwise normal upper GI tract, aside from a small pyloric channell erosion, status post dilation as described above.   Marland Kitchen FLEXIBLE SIGMOIDOSCOPY N/A 11/11/2017    Procedure: FLEXIBLE SIGMOIDOSCOPY;  Surgeon: Otis Brace, MD;  Location: [facility];  Service: Gastroenterology;  Laterality: N/A;  . JOINT REPLACEMENT    . LUMBAR DISC SURGERY  X 2  . POLYPECTOMY  06/24/2017   Procedure: POLYPECTOMY;  Surgeon: Daneil Dolin, MD;  Location: [facility];  Service: Endoscopy;;  ascending  . POSTERIOR LUMBAR FUSION  ? date 1st fusion ; 07/2006   previous L4-5 Ray threaded fusion cage; Exploration of L4-5 fusion & PLIF 07/22/2006/notes 07/22/2006  . TONSILLECTOMY    . TOTAL KNEE ARTHROPLASTY Right 05/24/2013   Procedure: TOTAL KNEE ARTHROPLASTY;  Surgeon: [name], MD;  Location: [facility];  Service: Orthopedics;  Laterality: Right;  . TOTAL KNEE ARTHROPLASTY Left   . VAGINAL HYSTERECTOMY      There were no vitals filed for this visit.  Subjective Assessment - 08/26/18 1527    Subjective  Bathroom doors have been widened and w/c fits through so Steady should fit through. Not using Harrel Lemon at home.  Having more RLE pain today; daughter noted a "knot" on the side of patient's RLE.  Pt asking if it is a blood clot.    Patient is accompained by:  Family member    Pertinent History  left sided CVA 10/29/17, Chronic CHF, AFib, COPD, Chronic Kidney Disease stage III, hx of bil TKA, Lumbar surgery    Limitations  Lifting;Standing;Walking;House hold activities    Diagnostic tests  MRI: left external capsule CVA    Patient Stated Goals  move around better in the home and improve function    Currently in Pain?  Yes    Pain Score  5     Pain Location  Leg    Pain Orientation  Right;Lower    Pain Descriptors / Indicators  Aching;Sore    Pain Type  Acute pain    Pain Onset  In the past 7 days                       [facility] Adult PT Treatment/Exercise - 08/26/18 1528      Transfers   Transfers  Sit to Stand;Stand to Sit    Sit to Stand  3: Mod assist;4: Min assist;From elevated surface;Without upper extremity assist;With upper extremity assist     Sit to Stand Details (indicate cue type and reason)  Mod A from w/c to stand with UE support on Stedy with pt reporting increased soreness in R lower leg but able to continue with standing.  From [facility] performed sit <> stand without use of UE to increase activation of RLE extensors and core strengthening with therapist on R side providing cues for head righting in midline and coming to stand in midline.  Tactile cues also required  for more upright trunk and RLE extension activation.  To return to w/c pt performed sit > stand from stedy with min A     Stand to Sit  3: Mod assist    Stand to Sit Details  focus on coming to sit in midline with increased eccentric control with forward lean    Transfer via [place]      Therapeutic Activites    Therapeutic Activities  Other Therapeutic Activities    Other Therapeutic Activities  Continued to assess R lower leg due to c/o increased pain and a "knot" on the outside of her RLE.  Pt does have a new, palpable lump on posterior lateral aspect of R lower leg between the fibula and gastroc mm.  Tender to palpation and increased pain with ankle movement.  No warmth, redness or edema noted.  Recommended that when pt goes to see Dr. Melina Copa on Tuesday to show her the area.  Also recommended that if pain worsened or pt developed warmth, redness, edema to go to the ED.  Discussed that it is likely too superficial to be a DVT but should be examined by physician.  Daughter asking if they should cancel therapy on Monday; discussed other activities that therapy can work on including bed mobility and trunk control and sitting balance training to minimize pain in RLE.      Lumbar Exercises: Seated   Other Seated Lumbar Exercises  Seated on large rockerboard positioned laterally.  Placed LUE on small physioball and performed postural control and head/trunk righting training weight shifting to L side pushing ball to L with therapist facilitating L trunk elongation and R  trunk shortening with cues for L shoulder depression and upward rotation and head tilt back to R.  Switched ball under RUE and performed controlled weight shift to R and then back to midline.      Knee/Hip Exercises: Seated   Sit to Sand  5 reps;without UE support   from elevated position on Steady in midline            PT Education - 08/26/18 1549    Education Details  signs and symptoms to warrant seeking earlier medical care for pain in RLE; use of steady at home, order for AFO - will contact orthotic company to attend therapy session    Person(s) Educated  Patient;Child(ren)    Methods  Explanation    Comprehension  Verbalized understanding       PT Short Term Goals - 08/19/18 0913      PT SHORT TERM GOAL #1   Title  Pt and family will be independent with supine, seated and standing HEP    Time  4    Status  New    Target Date  09/18/18      PT SHORT TERM GOAL #2   Title  Pt will perform slideboard transfers to left and right on level surfaces with min A of family    Baseline  Mod to R, Min to L    Time  4    Period  Weeks    Status  New    Target Date  09/18/18      PT SHORT TERM GOAL #3   Title  Pt will perform bed mobility on flat mat with mod A (supine <> sit and rolling)    Time  4    Period  Weeks    Status  New    Target Date  09/18/18  PT SHORT TERM GOAL #4   Title  Pt will perform sit > stand pushing from wheelchair with mod A on R side    Time  4    Period  Weeks    Status  New    Target Date  09/18/18      PT SHORT TERM GOAL #5   Title  Pt will ambulate x 25' with RW with hand orthosis, trial AFO and mod-max A of one therapist    Time  4    Period  Weeks    Status  Revised    Target Date  09/18/18        PT Long Term Goals - 08/19/18 0916      PT LONG TERM GOAL #1   Title  Patient and family will be independent with final HEP recommendations    Time  8    Period  Weeks    Status  Revised    Target Date  10/18/18      PT LONG  TERM GOAL #2   Title  Patient will perform sit <> stand from wheelchair with min A and transfer stand pivot with RW and AFO with Mod A of one person    Time  8    Period  Weeks    Status  Revised    Target Date  10/18/18      PT LONG TERM GOAL #3   Title  Pt will perform bed mobility on flat mat with min A    Time  8    Period  Weeks    Status  Revised    Target Date  10/18/18      PT LONG TERM GOAL #4   Title  Family will demonstrate independent ability to don appropriate AFO with R shoe    Time  8    Period  Weeks    Status  New    Target Date  10/18/18      PT LONG TERM GOAL #5   Title  Patient will ambulate 40 feet or greater with min A, AFO, and LRAD to safely navigate around home.    Time  8    Period  Weeks    Status  Revised    Target Date  10/18/18            Plan - 08/26/18 1552    Clinical Impression Statement  Due to pt having increased pain in R lower leg today treatment session focused on trunk control, head and trunk righting and weight shifting on unstable rockerboard surface.  Pt tends to lead with head tilt to L and L shoulder elevation; significant cues required to initiate movement at core and pelvis.  Performed sit > stands from [place] without use of UE to facilitate increased LE extension activation in midline and increased trunk control and core activation.  Pt was limited today by RLE pain; pt does have appointment with PCP on Tuesday; strongly recommending physician assess area on RLE.    Personal Factors and Comorbidities  Age;Comorbidity 3+;Time since onset of injury/illness/exacerbation    Examination-Activity Limitations  Lift;Dressing;Self Feeding;Transfers;Toileting;Stand;Locomotion Level;Reach Overhead;Bed Mobility    Rehab Potential  Fair    PT Frequency  2x / week    PT Duration  8 weeks    PT Treatment/Interventions  ADLs/Self Care Home Management;Electrical Stimulation;Gait training;Stair training;Functional mobility training;Balance  training;Therapeutic exercise;Therapeutic activities;Neuromuscular re-education;Wheelchair mobility training;Manual techniques;Passive range of motion;Patient/family education;DME Instruction;Energy conservation;Orthotic Fit/Training    PT Next Visit  Plan  Stedy transfers and sit <> stand on Stedy without UE support.  Seated on large rockerboard performing lateral tilts or anterior/posterior for sit <> stand.  Work on decreasing assistance needed to perform level slideboard transfers to L and R.  Work on bed mobility on flat bed.  If you have skilled +2 (another PT or Vania Rea!!) Gait at // bars with [place] blue rocker AFO; gait with RW if have two people.  Add to HEP: WB exercises for mm activation    Recommended Other Services  Need to call [employer] for AFO assessment    Consulted and Agree with Plan of Care  Patient;Family member/caregiver    Family Member Consulted  Daughter       Patient will benefit from skilled therapeutic intervention in order to improve the following deficits and impairments:  Pain, Postural dysfunction, Impaired tone, Decreased mobility, Decreased activity tolerance, Decreased range of motion, Decreased strength, Impaired UE functional use, Difficulty walking, Decreased balance, Abnormal gait, Decreased endurance  Visit Diagnosis: 1. Other symptoms and signs involving the nervous system   2. 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 Difficulty in walking, not elsewhere classified   4. Abnormal posture   5. Muscle weakness (generalized)        Problem List Patient Active Problem List   Diagnosis Date Noted  . Physical deconditioning 12/19/2017  . Chronic kidney disease (CKD), stage III (moderate) ([facility])   . Hypoalbuminemia due to protein-calorie malnutrition ([place])   . Intractable migraine without status migrainosus   . Reactive depression   . Hemiparesis affecting right side as late effect of stroke ([place])   . Slow  transit constipation   . PAF (paroxysmal atrial fibrillation) ([place])   . Dysphasia, post-stroke   . COPD (chronic obstructive pulmonary disease) ([place]) 10/29/2017  . Chronic diastolic CHF (congestive heart failure) ([place]) 10/29/2017  . Hypothyroidism 10/29/2017  . Essential hypertension 10/29/2017  . GERD (gastroesophageal reflux disease) 10/29/2017  . Anxiety 10/29/2017  . 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 Hyperlipidemia 04/20/2013   Rico Junker, PT, DPT 08/26/18    4:12 PM    [facility] [address] [place], [place], [address] Phone: [phone]   Fax:  [phone]  Name: Holly Flores MRN: [id number] Date of Birth: March 12, 1945

## 2018-08-29 ENCOUNTER — Other Ambulatory Visit: Payer: Self-pay

## 2018-08-29 ENCOUNTER — Ambulatory Visit: Payer: Medicare Other | Admitting: Physical Therapy

## 2018-08-29 DIAGNOSIS — R29818 Other symptoms and signs involving the nervous system: Secondary | ICD-10-CM

## 2018-08-29 DIAGNOSIS — M6281 Muscle weakness (generalized): Secondary | ICD-10-CM

## 2018-08-29 DIAGNOSIS — R262 Difficulty in walking, not elsewhere classified: Secondary | ICD-10-CM

## 2018-08-29 DIAGNOSIS — R293 Abnormal posture: Secondary | ICD-10-CM

## 2018-08-29 DIAGNOSIS — I69351 Hemiplegia and hemiparesis following cerebral infarction affecting right dominant side: Secondary | ICD-10-CM

## 2018-08-29 NOTE — Therapy (Signed)
[facility] [address] [place], [place], [address] Phone: [phone]   Fax:  [phone]  Physical Therapy Treatment  Patient Details  Name: Holly Flores MRN: [id number] Date of Birth: Feb 03, 1946 Referring Provider (PT): Octavio Graves, DO  CLINIC OPERATION CHANGES: [facility] is open at lower capacity following universal masking, social distancing, and patient screening.  The patient's COVID risk of complications score is 6.  Encounter Date: 08/29/2018  PT End of Session - 08/29/18 1405    Visit Number  13    Number of Visits  26    Date for PT Re-Evaluation  10/18/18    Authorization Type  Progress note every 10th visit; KX modifier at 15th visit.    PT Start Time  1306    PT Stop Time  1347    PT Time Calculation (min)  41 min    Equipment Utilized During Treatment  Gait belt    Activity Tolerance  Patient tolerated treatment well    Behavior During Therapy  WFL for tasks assessed/performed       Past Medical History:  Diagnosis Date  . Anxiety   . Arthritis    "all over" (12/20/2017)  . Central retinal vein occlusion of left eye 01/22/2015  . CHF (congestive heart failure) ([place])   . Chronic bronchitis ([place])    "get it most years" (12/20/2017)  . Chronic lower back pain   . CKD (chronic kidney disease), stage II   . COPD (chronic obstructive pulmonary disease) ([place])   . CVA (cerebral vascular accident) ([place]) 12/30/2014   OCULAR  LEFT  EYE   . Depression   . Fibromyalgia   . GERD (gastroesophageal reflux disease)   . Goiter   . History of colonic polyps 05/14/2014  . Hyperlipidemia   . Hypertension   . Hypothyroidism   . Kidney cysts    left side  . 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 Migraine    "couple times/wk; been having them for a long time" (12/20/2017)  . Myocardial infarction [facility])    "one dr said I did; one said I didn't;   don't remember when it was" (12/20/2017)  . 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 On home oxygen therapy    "3L; sleep w/it q night" (12/20/2017)  . Pneumonia    "several times" (12/20/2017)  . Shortness of breath   . Sleep apnea    "stopped mask when I got on the oxygen" (12/20/2017)  . Stroke [facility])     Past Surgical History:  Procedure Laterality Date  . ANTERIOR CERVICAL DECOMP/DISCECTOMY FUSION    . BACK SURGERY    . BREAST BIOPSY     left-non cancerous  . CARDIAC CATHETERIZATION    . CARPAL TUNNEL RELEASE Left   . CATARACT EXTRACTION W/PHACO  09/21/2011   Procedure: CATARACT EXTRACTION PHACO AND INTRAOCULAR LENS PLACEMENT (IOC);  Surgeon: Williams Che, MD;  Location: [facility];  Service: Ophthalmology;  Laterality: Left;  CDE:9.78  . CATARACT EXTRACTION W/PHACO Right 10/15/2014   Procedure: CATARACT EXTRACTION PHACO AND INTRAOCULAR LENS PLACEMENT ([place]);  Surgeon: Williams Che, MD;  Location: [facility];  Service: Ophthalmology;  Laterality: Right;  CDE:4.19  . [place]; 1972  . CHOLECYSTECTOMY OPEN    . COLONOSCOPY  2006   RMR: 1. Internal hemorroids, otherwise normal rectum. 2. Pedunculated polyp at 35 cm. reomved with snare. The remainder of teh colonic  mucosa appeared normal.   . COLONOSCOPY  2011   Dr. Gala Romney: multiple ascending colon polyps and rectal polyp, adenomatous  . COLONOSCOPY N/A 05/31/2014   Procedure: COLONOSCOPY;  Surgeon: Daneil Dolin, MD;  Location: [facility];  Service: Endoscopy;  Laterality: N/A;  130  . COLONOSCOPY WITH PROPOFOL N/A 06/24/2017   Procedure: COLONOSCOPY WITH PROPOFOL;  Surgeon: Daneil Dolin, MD;  Location: [facility];  Service: Endoscopy;  Laterality: N/A;  2:15pm  . ESOPHAGOGASTRODUODENOSCOPY  2006   Dr. Gala Romney: Subtle Schatzkis ring, otherwise normal upper GI tract, aside from a small pyloric channell erosion, status post dilation as described above.   Marland Kitchen FLEXIBLE SIGMOIDOSCOPY N/A 11/11/2017   Procedure: FLEXIBLE SIGMOIDOSCOPY;   Surgeon: Otis Brace, MD;  Location: [facility];  Service: Gastroenterology;  Laterality: N/A;  . JOINT REPLACEMENT    . LUMBAR DISC SURGERY  X 2  . POLYPECTOMY  06/24/2017   Procedure: POLYPECTOMY;  Surgeon: Daneil Dolin, MD;  Location: [facility];  Service: Endoscopy;;  ascending  . POSTERIOR LUMBAR FUSION  ? date 1st fusion ; 07/2006   previous L4-5 Ray threaded fusion cage; Exploration of L4-5 fusion & PLIF 07/22/2006/notes 07/22/2006  . TONSILLECTOMY    . TOTAL KNEE ARTHROPLASTY Right 05/24/2013   Procedure: TOTAL KNEE ARTHROPLASTY;  Surgeon: [name], MD;  Location: [facility];  Service: Orthopedics;  Laterality: Right;  . TOTAL KNEE ARTHROPLASTY Left   . VAGINAL HYSTERECTOMY      There were no vitals filed for this visit.  Subjective Assessment - 08/29/18 1304    Subjective  Bathroom doors have been widened and w/c fits through so Steady should fit through. Not using Harrel Lemon at home.  Having more RLE pain today; daughter noted a "knot" on the side of patient's RLE.  Pt asking if it is a blood clot.    Patient is accompained by:  Family member    Pertinent History  left sided CVA 10/29/17, Chronic CHF, AFib, COPD, Chronic Kidney Disease stage III, hx of bil TKA, Lumbar surgery    Limitations  Lifting;Standing;Walking;House hold activities    Diagnostic tests  MRI: left external capsule CVA    Patient Stated Goals  move around better in the home and improve function    Currently in Pain?  Yes    Pain Location  Leg    Pain Orientation  Right    Pain Descriptors / Indicators  Stabbing;Sharp    Pain Type  Acute pain    Pain Onset  In the past 7 days    Aggravating Factors   getting in/out of car    Pain Relieving Factors  "when I put that cream on it"  Hemp cream?            [facility] Adult PT Treatment/Exercise -            Transfers   Transfers  Sit to Stand;Stand to Sit    Sit to Stand  3: Mod assist;4: From elevated surface;Without upper extremity assist;With upper  extremity assist    Sit to Stand Details (indicate cue type and reason)  Mod A from w/c to stand with UE support on Stedy.  From [facility] performed sit <> stand without use of UE to increase activation of RLE extensors and core strengthening with therapist on R side providing cues for head righting in midline and coming to stand in midline.  Tactile cues also required for more upright trunk and RLE extension activation.  Used hand grip assist mitt on R UE during transfers and standing with elbow supported on Stedy.     Stand to Sit  3: Mod assist    Stand to Sit Details  focus on coming to sit in midline with increased eccentric control with forward lean    Transfer via Lift Equipment  Stedy                       Lumbar Exercises: Seated   Other Seated Lumbar Exercises  Seated on mat working on anterior/posterior pelvic tilts and lateral weight shifting. Pt with decreased activation on R side of trunk and needing max facilitation.  Transitioned from sitting<>propped on L elbow working on transition to/from this position for bed mobility training.  Pt able to perform with moderate cues and min assist on R side.        Knee/Hip Exercises: Seated   Sit to Sand  5 reps x 2 sets;without UE support   from elevated position on Stedy in midline     Pt stood at [employer] x 4 reps x 3-4 minutes as descibed in details above.  Used Stedy for tranfer from w/c<>mat as described above.    PT Education - 08/29/18 1404    Education Details  Pt to obtain order from MD visit tomorrow for [facility] for home use    Person(s) Educated  Patient;Child(ren)    Methods  Explanation;Handout    Comprehension  Verbalized understanding       PT Short Term Goals - 08/19/18 0913      PT SHORT TERM GOAL #1   Title  Pt and family will be independent with supine, seated and standing HEP    Time  4    Status  New    Target Date  09/18/18      PT SHORT TERM GOAL #2   Title  Pt will perform slideboard transfers  to left and right on level surfaces with min A of family    Baseline  Mod to R, Min to L    Time  4    Period  Weeks    Status  New    Target Date  09/18/18      PT SHORT TERM GOAL #3   Title  Pt will perform bed mobility on flat mat with mod A (supine <> sit and rolling)    Time  4    Period  Weeks    Status  New    Target Date  09/18/18      PT SHORT TERM GOAL #4   Title  Pt will perform sit > stand pushing from wheelchair with mod A on R side    Time  4    Period  Weeks    Status  New    Target Date  09/18/18      PT SHORT TERM GOAL #5   Title  Pt will ambulate x 25' with RW with hand orthosis, trial AFO and mod-max A of one therapist    Time  4    Period  Weeks    Status  Revised    Target Date  09/18/18        PT Long Term Goals - 08/19/18 0916      PT LONG TERM GOAL #1   Title  Patient and family will be independent with final HEP recommendations    Time  8    Period  Weeks  Status  Revised    Target Date  10/18/18      PT LONG TERM GOAL #2   Title  Patient will perform sit <> stand from wheelchair with min A and transfer stand pivot with RW and AFO with Mod A of one person    Time  8    Period  Weeks    Status  Revised    Target Date  10/18/18      PT LONG TERM GOAL #3   Title  Pt will perform bed mobility on flat mat with min A    Time  8    Period  Weeks    Status  Revised    Target Date  10/18/18      PT LONG TERM GOAL #4   Title  Family will demonstrate independent ability to don appropriate AFO with R shoe    Time  8    Period  Weeks    Status  New    Target Date  10/18/18      PT LONG TERM GOAL #5   Title  Patient will ambulate 40 feet or greater with min A, AFO, and LRAD to safely navigate around home.    Time  8    Period  Weeks    Status  Revised    Target Date  10/18/18            Plan - 08/29/18 1407    Clinical Impression Statement  Pt continues to have pain in RLE as noted on previous session but states the pain has not  increased since last visit.  Treatment session focused on transfer training, standing balance/tolerance and trunk control.  Pt to see PCP tomorrow and will address c/o R LE pain.  Pt also aware to obtain order for Stedy from MD for home use.  Continue PT per POC.    Personal Factors and Comorbidities  Age;Comorbidity 3+;Time since onset of injury/illness/exacerbation    Examination-Activity Limitations  Lift;Dressing;Self Feeding;Transfers;Toileting;Stand;Locomotion Level;Reach Overhead;Bed Mobility    Rehab Potential  Fair    PT Frequency  2x / week    PT Duration  8 weeks    PT Treatment/Interventions  ADLs/Self Care Home Management;Electrical Stimulation;Gait training;Stair training;Functional mobility training;Balance training;Therapeutic exercise;Therapeutic activities;Neuromuscular re-education;Wheelchair mobility training;Manual techniques;Passive range of motion;Patient/family education;DME Instruction;Energy conservation;Orthotic Fit/Training    PT Next Visit Plan  Follow up on MD visit regarding RLE pain and order for [facility].  Do we have AFO order yet?  Stedy transfers and sit <> stand on Stedy without UE support.  Seated on large rockerboard performing lateral tilts or anterior/posterior for sit <> stand.  Work on decreasing assistance needed to perform level slideboard transfers to L and R.  Work on bed mobility on flat bed.  If you have skilled +2 (another PT or Vania Rea!!) Gait at // bars with [place] blue rocker AFO; gait with RW if have two people.  Add to HEP: WB exercises for mm activation    Consulted and Agree with Plan of Care  Patient;Family member/caregiver    Family Member Consulted  son       Patient will benefit from skilled therapeutic intervention in order to improve the following deficits and impairments:  Pain, Postural dysfunction, Impaired tone, Decreased mobility, Decreased activity tolerance, Decreased range of motion, Decreased strength, Impaired UE functional use,  Difficulty walking, Decreased balance, Abnormal gait, Decreased endurance  Visit Diagnosis: 1. Other symptoms and signs involving the nervous system   2. 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 Difficulty in walking, not elsewhere classified   4. Abnormal posture   5. Muscle weakness (generalized)        Problem List Patient Active Problem List   Diagnosis Date Noted  . Physical deconditioning 12/19/2017  . Chronic kidney disease (CKD), stage III (moderate) ([facility])   . Hypoalbuminemia due to protein-calorie malnutrition ([place])   . Intractable migraine without status migrainosus   . Reactive depression   . Hemiparesis affecting right side as late effect of stroke ([place])   . Slow transit constipation   . PAF (paroxysmal atrial fibrillation) ([place])   . Dysphasia, post-stroke   . COPD (chronic obstructive pulmonary disease) ([place]) 10/29/2017  . Chronic diastolic CHF (congestive heart failure) ([place]) 10/29/2017  . Hypothyroidism 10/29/2017  . Essential hypertension 10/29/2017  . GERD (gastroesophageal reflux disease) 10/29/2017  . Anxiety 10/29/2017  . 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 Hyperlipidemia 04/20/2013    Narda Bonds, PTA [facility] 08/29/18 2:22 PM Phone: [phone] Fax: [facility] [address] [place], [place], [address] Phone: [phone]   Fax:  [phone]  Name: Holly Flores MRN: [id number] Date of Birth: [birth year]

## 2018-09-02 ENCOUNTER — Encounter: Payer: Self-pay | Admitting: Physical Therapy

## 2018-09-02 ENCOUNTER — Other Ambulatory Visit: Payer: Self-pay

## 2018-09-02 ENCOUNTER — Ambulatory Visit: Payer: Medicare Other | Admitting: Physical Therapy

## 2018-09-02 DIAGNOSIS — R29818 Other symptoms and signs involving the nervous system: Secondary | ICD-10-CM

## 2018-09-02 DIAGNOSIS — M6281 Muscle weakness (generalized): Secondary | ICD-10-CM

## 2018-09-02 DIAGNOSIS — R262 Difficulty in walking, not elsewhere classified: Secondary | ICD-10-CM

## 2018-09-02 DIAGNOSIS — I69351 Hemiplegia and hemiparesis following cerebral infarction affecting right dominant side: Secondary | ICD-10-CM

## 2018-09-02 DIAGNOSIS — R293 Abnormal posture: Secondary | ICD-10-CM

## 2018-09-02 NOTE — Therapy (Signed)
[facility] [address] [place], [place], [address] Phone: [phone]   Fax:  [phone]  Physical Therapy Treatment  Patient Details  Name: Holly Flores MRN: [id number] Date of Birth: 1945-05-23 Referring Provider (PT): Octavio Graves, DO   Encounter Date: 09/02/2018   CLINIC OPERATION CHANGES: [facility] is open at lower capacity following universal masking, social distancing, and patient screening.  The patient's COVID risk of complications score is 6.   PT End of Session - 09/02/18 1414    Visit Number  14    Number of Visits  26    Date for PT Re-Evaluation  10/18/18    Authorization Type  Progress note every 10th visit; KX modifier at 15th visit.    PT Start Time  1300    PT Stop Time  1345    PT Time Calculation (min)  45 min    Equipment Utilized During Treatment  Gait belt    Activity Tolerance  Patient tolerated treatment well    Behavior During Therapy  WFL for tasks assessed/performed       Past Medical History:  Diagnosis Date  . Anxiety   . Arthritis    "all over" (12/20/2017)  . Central retinal vein occlusion of left eye 01/22/2015  . CHF (congestive heart failure) ([place])   . Chronic bronchitis ([place])    "get it most years" (12/20/2017)  . Chronic lower back pain   . CKD (chronic kidney disease), stage II   . COPD (chronic obstructive pulmonary disease) ([place])   . CVA (cerebral vascular accident) ([place]) 12/30/2014   OCULAR  LEFT  EYE   . Depression   . Fibromyalgia   . GERD (gastroesophageal reflux disease)   . Goiter   . History of colonic polyps 05/14/2014  . Hyperlipidemia   . Hypertension   . Hypothyroidism   . Kidney cysts    left side  . 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 Migraine    "couple times/wk; been having them for a long time" (12/20/2017)  . Myocardial infarction [facility])    "one dr said I did; one said I didn't;   don't remember when it was" (12/20/2017)  . 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 On home oxygen therapy    "3L; sleep w/it q night" (12/20/2017)  . Pneumonia    "several times" (12/20/2017)  . Shortness of breath   . Sleep apnea    "stopped mask when I got on the oxygen" (12/20/2017)  . Stroke [facility])     Past Surgical History:  Procedure Laterality Date  . ANTERIOR CERVICAL DECOMP/DISCECTOMY FUSION    . BACK SURGERY    . BREAST BIOPSY     left-non cancerous  . CARDIAC CATHETERIZATION    . CARPAL TUNNEL RELEASE Left   . CATARACT EXTRACTION W/PHACO  09/21/2011   Procedure: CATARACT EXTRACTION PHACO AND INTRAOCULAR LENS PLACEMENT (IOC);  Surgeon: Williams Che, MD;  Location: [facility];  Service: Ophthalmology;  Laterality: Left;  CDE:9.78  . CATARACT EXTRACTION W/PHACO Right 10/15/2014   Procedure: CATARACT EXTRACTION PHACO AND INTRAOCULAR LENS PLACEMENT ([place]);  Surgeon: Williams Che, MD;  Location: [facility];  Service: Ophthalmology;  Laterality: Right;  CDE:4.19  . [place]; 1972  . CHOLECYSTECTOMY OPEN    . COLONOSCOPY  2006   RMR: 1. Internal hemorroids, otherwise normal rectum. 2. Pedunculated polyp at 35 cm. reomved with snare. The remainder  of teh colonic mucosa appeared normal.   . COLONOSCOPY  2011   Dr. Gala Romney: multiple ascending colon polyps and rectal polyp, adenomatous  . COLONOSCOPY N/A 05/31/2014   Procedure: COLONOSCOPY;  Surgeon: Daneil Dolin, MD;  Location: [facility];  Service: Endoscopy;  Laterality: N/A;  130  . COLONOSCOPY WITH PROPOFOL N/A 06/24/2017   Procedure: COLONOSCOPY WITH PROPOFOL;  Surgeon: Daneil Dolin, MD;  Location: [facility];  Service: Endoscopy;  Laterality: N/A;  2:15pm  . ESOPHAGOGASTRODUODENOSCOPY  2006   Dr. Gala Romney: Subtle Schatzkis ring, otherwise normal upper GI tract, aside from a small pyloric channell erosion, status post dilation as described above.   Marland Kitchen FLEXIBLE SIGMOIDOSCOPY N/A 11/11/2017   Procedure: FLEXIBLE  SIGMOIDOSCOPY;  Surgeon: Otis Brace, MD;  Location: [facility];  Service: Gastroenterology;  Laterality: N/A;  . JOINT REPLACEMENT    . LUMBAR DISC SURGERY  X 2  . POLYPECTOMY  06/24/2017   Procedure: POLYPECTOMY;  Surgeon: Daneil Dolin, MD;  Location: [facility];  Service: Endoscopy;;  ascending  . POSTERIOR LUMBAR FUSION  ? date 1st fusion ; 07/2006   previous L4-5 Ray threaded fusion cage; Exploration of L4-5 fusion & PLIF 07/22/2006/notes 07/22/2006  . TONSILLECTOMY    . TOTAL KNEE ARTHROPLASTY Right 05/24/2013   Procedure: TOTAL KNEE ARTHROPLASTY;  Surgeon: [name], MD;  Location: [facility];  Service: Orthopedics;  Laterality: Right;  . TOTAL KNEE ARTHROPLASTY Left   . VAGINAL HYSTERECTOMY      There were no vitals filed for this visit.  Subjective Assessment - 09/02/18 1305    Subjective  Pt had appointment with MD who feels the knot on her leg is a cyst in the soft tissue. Brought in order for [facility].  Orthotist here for appointment.    Patient is accompained by:  Family member    Pertinent History  left sided CVA 10/29/17, Chronic CHF, AFib, COPD, Chronic Kidney Disease stage III, hx of bil TKA, Lumbar surgery    Limitations  Lifting;Standing;Walking;House hold activities    Diagnostic tests  MRI: left external capsule CVA    Patient Stated Goals  move around better in the home and improve function    Currently in Pain?  No/denies    Pain Onset  In the past 7 days                       [facility] Adult PT Treatment/Exercise - 09/02/18 1406      Transfers   Transfers  Sit to Stand;Stand to Sit    Sit to Stand  3: Mod assist    Sit to Stand Details (indicate cue type and reason)  With orthotist observing transfer sit <> stand in [place] with and without use of UE and sit <> stand from w/c at // bars to assess for most appropriate and supportive AFO.  Performed with [place] AFO and then with ground reaction [place] AFO.  Once standing pt continued to require  therapist assistance to bring RLE into full knee and hip extension manually and with verbal cues for upright posture    Stand to Sit  3: Mod assist    Stand to Sit Details  with assistance to control descent    Transfer via [place]      Ambulation/Gait   Ambulation/Gait  Yes    Ambulation/Gait Assistance  2: Max assist    Ambulation/Gait Assistance Details  with one UE support on //  bars x 2 sets - once with [employer] and then with ground reaction [place] AFO - with therapist providing support on R side to maintain upright trunk, cues for weight shifting to L in order to initiate swing phase of RLE and facilitating full weight shift anteriorly and to the R over R stance LE and stability to R knee in stance.  When support removed pt continues to experience buckling/knee flexion moment in R stance    Ambulation Distance (Feet)  10 Feet    Assistive device  Parallel bars    Gait Pattern  Step-to pattern;Decreased step length - left;Decreased stance time - right;Decreased hip/knee flexion - right;Decreased dorsiflexion - right;Decreased weight shift to right;Right flexed knee in stance;Trunk flexed    Ambulation Surface  Level;Indoor      Therapeutic Activites    Therapeutic Activities  Other Therapeutic Activities    Other Therapeutic Activities  Discussed options for AFO - off the shelf carbon AFO vs. custom molded plastic AFO.  Due to significant hemiplegia and need for increased support in standing and when ambulating with family assist pt will benefit from custom molded AFO.  Will set up appointment to be cast at orthotist office             PT Education - 09/02/18 1413    Education Details  see TA; will set up more PT appointments in August    Person(s) Educated  Patient;Child(ren)    Methods  Explanation    Comprehension  Verbalized understanding       PT Short Term Goals - 08/19/18 0913      PT SHORT TERM GOAL #1   Title  Pt and family will be  independent with supine, seated and standing HEP    Time  4    Status  New    Target Date  09/18/18      PT SHORT TERM GOAL #2   Title  Pt will perform slideboard transfers to left and right on level surfaces with min A of family    Baseline  Mod to R, Min to L    Time  4    Period  Weeks    Status  New    Target Date  09/18/18      PT SHORT TERM GOAL #3   Title  Pt will perform bed mobility on flat mat with mod A (supine <> sit and rolling)    Time  4    Period  Weeks    Status  New    Target Date  09/18/18      PT SHORT TERM GOAL #4   Title  Pt will perform sit > stand pushing from wheelchair with mod A on R side    Time  4    Period  Weeks    Status  New    Target Date  09/18/18      PT SHORT TERM GOAL #5   Title  Pt will ambulate x 25' with RW with hand orthosis, trial AFO and mod-max A of one therapist    Time  4    Period  Weeks    Status  Revised    Target Date  09/18/18        PT Long Term Goals - 08/19/18 0916      PT LONG TERM GOAL #1   Title  Patient and family will be independent with final HEP recommendations    Time  8  Period  Weeks    Status  Revised    Target Date  10/18/18      PT LONG TERM GOAL #2   Title  Patient will perform sit <> stand from wheelchair with min A and transfer stand pivot with RW and AFO with Mod A of one person    Time  8    Period  Weeks    Status  Revised    Target Date  10/18/18      PT LONG TERM GOAL #3   Title  Pt will perform bed mobility on flat mat with min A    Time  8    Period  Weeks    Status  Revised    Target Date  10/18/18      PT LONG TERM GOAL #4   Title  Family will demonstrate independent ability to don appropriate AFO with R shoe    Time  8    Period  Weeks    Status  New    Target Date  10/18/18      PT LONG TERM GOAL #5   Title  Patient will ambulate 40 feet or greater with min A, AFO, and LRAD to safely navigate around home.    Time  8    Period  Weeks    Status  Revised    Target  Date  10/18/18            Plan - 09/02/18 1416    Clinical Impression Statement  Orthotist present to assess pt with PT for most appropriate AFO for supported standing, transfers and ambulation with family assist when appropriate.  Orthotist observed pt perform transfers sit <> stand and ambulation with therapist.  Pt will benefit from custom molded AFO for maximum support and facilitate knee extension in standing.  Pt and son agreeable to plan.  Will set up appointment for pt to be cast for AFO.  Will continue to progress towards LTG.    Personal Factors and Comorbidities  Age;Comorbidity 3+;Time since onset of injury/illness/exacerbation    Examination-Activity Limitations  Lift;Dressing;Self Feeding;Transfers;Toileting;Stand;Locomotion Level;Reach Overhead;Bed Mobility    Rehab Potential  Fair    PT Frequency  2x / week    PT Duration  8 weeks    PT Treatment/Interventions  ADLs/Self Care Home Management;Electrical Stimulation;Gait training;Stair training;Functional mobility training;Balance training;Therapeutic exercise;Therapeutic activities;Neuromuscular re-education;Wheelchair mobility training;Manual techniques;Passive range of motion;Patient/family education;DME Instruction;Energy conservation;Orthotic Fit/Training    PT Next Visit Plan  Did they get more PT scheduled for August??  Stedy transfers and sit <> stand on [place] without UE support.  Seated on large rockerboard or inverted BOSU performing lateral tilts or anterior/posterior for sit <> stand.  Work on decreasing assistance needed to perform level slideboard transfers to L and R.  Work on bed mobility on flat bed.  If you have skilled +2 (another PT or Vania Rea!!) Gait at // bars with [place] blue rocker AFO; gait with RW if have two people.  Add to HEP: WB exercises for mm activation    Recommended Other Services  Primary PT to discuss with pt process for obtaining power w/c and write LMN for Stedy lift at home    Consulted and Agree  with Plan of Care  Patient;Family member/caregiver    Family Member Consulted  son       Patient will benefit from skilled therapeutic intervention in order to improve the following deficits and impairments:  Pain, Postural dysfunction, Impaired tone, Decreased mobility, Decreased activity  tolerance, Decreased range of motion, Decreased strength, Impaired UE functional use, Difficulty walking, Decreased balance, Abnormal gait, Decreased endurance  Visit Diagnosis: 1. 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 Muscle weakness (generalized)   3. Other symptoms and signs involving the nervous system   4. Difficulty in walking, not elsewhere classified   5. Abnormal posture        Problem List Patient Active Problem List   Diagnosis Date Noted  . Physical deconditioning 12/19/2017  . Chronic kidney disease (CKD), stage III (moderate) ([facility])   . Hypoalbuminemia due to protein-calorie malnutrition ([place])   . Intractable migraine without status migrainosus   . Reactive depression   . Hemiparesis affecting right side as late effect of stroke ([place])   . Slow transit constipation   . PAF (paroxysmal atrial fibrillation) ([place])   . Dysphasia, post-stroke   . COPD (chronic obstructive pulmonary disease) ([place]) 10/29/2017  . Chronic diastolic CHF (congestive heart failure) ([place]) 10/29/2017  . Hypothyroidism 10/29/2017  . Essential hypertension 10/29/2017  . GERD (gastroesophageal reflux disease) 10/29/2017  . Anxiety 10/29/2017  . 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 Hyperlipidemia 04/20/2013    Rico Junker, PT, DPT 09/02/18    3:35 PM    [facility] [address] [place], [place], [address] Phone: [phone]   Fax:  [phone]  Name: Holly Flores MRN: [id number] Date of Birth: 01-Feb-1946

## 2018-09-05 ENCOUNTER — Ambulatory Visit: Payer: Medicare Other

## 2018-09-05 ENCOUNTER — Emergency Department (HOSPITAL_COMMUNITY)
Admission: EM | Admit: 2018-09-05 | Discharge: 2018-09-05 | Disposition: A | Discharge status: Home / Self Care | Payer: Medicare Other | Attending: Emergency Medicine | Admitting: Emergency Medicine

## 2018-09-05 ENCOUNTER — Other Ambulatory Visit: Payer: Self-pay

## 2018-09-05 ENCOUNTER — Emergency Department (HOSPITAL_COMMUNITY): Payer: Medicare Other

## 2018-09-05 ENCOUNTER — Encounter (HOSPITAL_COMMUNITY): Payer: Self-pay

## 2018-09-05 VITALS — BP 158/86 | HR 62

## 2018-09-05 DIAGNOSIS — Z7901 Long term (current) use of anticoagulants: Secondary | ICD-10-CM | POA: Insufficient documentation

## 2018-09-05 DIAGNOSIS — I130 Hypertensive heart and chronic kidney disease with heart failure and stage 1 through stage 4 chronic kidney disease, or unspecified chronic kidney disease: Secondary | ICD-10-CM | POA: Diagnosis not present

## 2018-09-05 DIAGNOSIS — Z79899 Other long term (current) drug therapy: Secondary | ICD-10-CM | POA: Insufficient documentation

## 2018-09-05 DIAGNOSIS — N183 Chronic kidney disease, stage 3 (moderate): Secondary | ICD-10-CM | POA: Insufficient documentation

## 2018-09-05 DIAGNOSIS — R51 Headache: Secondary | ICD-10-CM | POA: Diagnosis not present

## 2018-09-05 DIAGNOSIS — I69351 Hemiplegia and hemiparesis following cerebral infarction affecting right dominant side: Secondary | ICD-10-CM | POA: Insufficient documentation

## 2018-09-05 DIAGNOSIS — R519 Headache, unspecified: Secondary | ICD-10-CM

## 2018-09-05 DIAGNOSIS — I5032 Chronic diastolic (congestive) heart failure: Secondary | ICD-10-CM | POA: Diagnosis not present

## 2018-09-05 DIAGNOSIS — E039 Hypothyroidism, unspecified: Secondary | ICD-10-CM | POA: Insufficient documentation

## 2018-09-05 DIAGNOSIS — Z96653 Presence of artificial knee joint, bilateral: Secondary | ICD-10-CM | POA: Diagnosis not present

## 2018-09-05 LAB — COMPREHENSIVE METABOLIC PANEL
ALT: 8 U/L (ref 0–44)
AST: 12 U/L — ABNORMAL LOW (ref 15–41)
Albumin: 3.6 g/dL (ref 3.5–5.0)
Alkaline Phosphatase: 79 U/L (ref 38–126)
Anion gap: 5 (ref 5–15)
BUN: 19 mg/dL (ref 8–23)
CO2: 29 mmol/L (ref 22–32)
Calcium: 9.2 mg/dL (ref 8.9–10.3)
Chloride: 107 mmol/L (ref 98–111)
Creatinine, Ser: 1.31 mg/dL — ABNORMAL HIGH (ref 0.44–1.00)
GFR calc Af Amer: 47 mL/min — ABNORMAL LOW (ref >60)
GFR calc non Af Amer: 41 mL/min — ABNORMAL LOW (ref >60)
Glucose, Bld: 108 mg/dL — ABNORMAL HIGH (ref 70–99)
Potassium: 4 mmol/L (ref 3.5–5.1)
Sodium: 141 mmol/L (ref 135–145)
Total Bilirubin: 0.6 mg/dL (ref 0.3–1.2)
Total Protein: 7.4 g/dL (ref 6.5–8.1)

## 2018-09-05 LAB — CBC WITH DIFFERENTIAL/PLATELET
Abs Immature Granulocytes: 0.02 10*3/uL (ref 0.00–0.07)
Basophils Absolute: 0.1 10*3/uL (ref 0.0–0.1)
Basophils Relative: 1 %
Eosinophils Absolute: 0.5 10*3/uL (ref 0.0–0.5)
Eosinophils Relative: 7 %
HCT: 36 % (ref 36.0–46.0)
Hemoglobin: 11.7 g/dL — ABNORMAL LOW (ref 12.0–15.0)
Immature Granulocytes: 0 %
Lymphocytes Relative: 42 %
Lymphs Abs: 3 10*3/uL (ref 0.7–4.0)
MCH: 31.8 pg (ref 26.0–34.0)
MCHC: 32.5 g/dL (ref 30.0–36.0)
MCV: 97.8 fL (ref 80.0–100.0)
Monocytes Absolute: 0.7 10*3/uL (ref 0.1–1.0)
Monocytes Relative: 9 %
Neutro Abs: 2.9 10*3/uL (ref 1.7–7.7)
Neutrophils Relative %: 41 %
Platelets: 206 10*3/uL (ref 150–400)
RBC: 3.68 MIL/uL — ABNORMAL LOW (ref 3.87–5.11)
RDW: 14.6 % (ref 11.5–15.5)
WBC: 7.1 10*3/uL (ref 4.0–10.5)
nRBC: 0 % (ref 0.0–0.2)

## 2018-09-05 LAB — APTT: aPTT: 30 seconds (ref 24–36)

## 2018-09-05 LAB — PROTIME-INR
INR: 1.2 (ref 0.8–1.2)
Prothrombin Time: 14.9 seconds (ref 11.4–15.2)

## 2018-09-05 LAB — SEDIMENTATION RATE: Sed Rate: 24 mm/hr — ABNORMAL HIGH (ref 0–22)

## 2018-09-05 MED ORDER — DIPHENHYDRAMINE HCL 50 MG/ML IJ SOLN
12.5000 mg | Freq: Once | INTRAMUSCULAR | Status: AC
  Administered 2018-09-05: 12.5 mg via INTRAVENOUS
  Filled 2018-09-05: qty 1

## 2018-09-05 MED ORDER — METOCLOPRAMIDE HCL 5 MG/ML IJ SOLN
5.0000 mg | Freq: Once | INTRAMUSCULAR | Status: AC
  Administered 2018-09-05: 5 mg via INTRAVENOUS
  Filled 2018-09-05: qty 2

## 2018-09-05 MED ORDER — SODIUM CHLORIDE 0.9 % IV BOLUS
500.0000 mL | Freq: Once | INTRAVENOUS | Status: AC
  Administered 2018-09-05: 500 mL via INTRAVENOUS

## 2018-09-05 MED ORDER — FENTANYL CITRATE (PF) 100 MCG/2ML IJ SOLN
25.0000 ug | Freq: Once | INTRAMUSCULAR | Status: AC
  Administered 2018-09-05: 25 ug via INTRAVENOUS
  Filled 2018-09-05: qty 2

## 2018-09-05 NOTE — ED Provider Notes (Addendum)
[facility] EMERGENCY DEPARTMENT Provider Note   CSN: [id number] Arrival date & time: 09/05/18  1427    History   Chief Complaint Chief Complaint  Patient presents with  . Headache    HPI Holly Flores is a 73 y.o. female.     Left temporal headache since this morning without new neurological deficits, fever, sweats, chills, stiff neck, confusion, speech difficulty.  ROS positive for questionable left visual disturbance.  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  Patient states there is been no change in the right side today.  Severity of symptoms is mild to moderate.  Nothing makes symptoms better or worse.     Past Medical History:  Diagnosis Date  . Anxiety   . Arthritis    "all over" (12/20/2017)  . Central retinal vein occlusion of left eye 01/22/2015  . CHF (congestive heart failure) ([place])   . Chronic bronchitis ([place])    "get it most years" (12/20/2017)  . Chronic lower back pain   . CKD (chronic kidney disease), stage II   . COPD (chronic obstructive pulmonary disease) ([place])   . CVA (cerebral vascular accident) ([place]) 12/30/2014   OCULAR  LEFT  EYE   . Depression   . Fibromyalgia   . GERD (gastroesophageal reflux disease)   . Goiter   . History of colonic polyps 05/14/2014  . Hyperlipidemia   . Hypertension   . Hypothyroidism   . Kidney cysts    left side  . 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 Migraine    "couple times/wk; been having them for a long time" (12/20/2017)  . Myocardial infarction [facility])    "one dr said I did; one said I didn't;  don't remember when it was" (12/20/2017)  . 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 On home oxygen therapy    "3L; sleep w/it q night" (12/20/2017)  . Pneumonia    "several times" (12/20/2017)  . Shortness of breath   . Sleep apnea    "stopped mask when I got on the oxygen" (12/20/2017)  . Stroke [facility])     Patient Active Problem  List   Diagnosis Date Noted  . Physical deconditioning 12/19/2017  . Chronic kidney disease (CKD), stage III (moderate) (HCC)   . Hypoalbuminemia due to protein-calorie malnutrition ([place])   . Intractable migraine without status migrainosus   . Reactive depression   . Hemiparesis affecting right side as late effect of stroke ([place])   . Slow transit constipation   . PAF (paroxysmal atrial fibrillation) ([place])   . Dysphasia, post-stroke   . COPD (chronic obstructive pulmonary disease) ([place]) 10/29/2017  . Chronic diastolic CHF (congestive heart failure) ([place]) 10/29/2017  . Hypothyroidism 10/29/2017  . Essential hypertension 10/29/2017  . GERD (gastroesophageal reflux disease) 10/29/2017  . Anxiety 10/29/2017  . 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 Hyperlipidemia 04/20/2013    Past Surgical History:  Procedure Laterality Date  . ANTERIOR CERVICAL DECOMP/DISCECTOMY FUSION    . BACK SURGERY    . BREAST BIOPSY     left-non cancerous  . CARDIAC CATHETERIZATION    . CARPAL TUNNEL RELEASE Left   . CATARACT EXTRACTION W/PHACO  09/21/2011   Procedure: CATARACT EXTRACTION PHACO AND INTRAOCULAR LENS PLACEMENT (IOC);  Surgeon: Williams Che, MD;  Location: [facility] ORS;  Service: Ophthalmology;  Laterality: Left;  CDE:9.78  . CATARACT EXTRACTION W/PHACO Right 10/15/2014   Procedure:  CATARACT EXTRACTION PHACO AND INTRAOCULAR LENS PLACEMENT (IOC);  Surgeon: Williams Che, MD;  Location: [facility] ORS;  Service: Ophthalmology;  Laterality: Right;  CDE:4.19  . [place]; 1972  . CHOLECYSTECTOMY OPEN    . COLONOSCOPY  2006   RMR: 1. Internal hemorroids, otherwise normal rectum. 2. Pedunculated polyp at 35 cm. reomved with snare. The remainder of teh colonic mucosa appeared normal.   . COLONOSCOPY  2011   Dr. Gala Romney: multiple ascending colon polyps and rectal polyp, adenomatous  . COLONOSCOPY N/A 05/31/2014   Procedure: COLONOSCOPY;  Surgeon: Daneil Dolin, MD;  Location: [facility] ENDO  SUITE;  Service: Endoscopy;  Laterality: N/A;  130  . COLONOSCOPY WITH PROPOFOL N/A 06/24/2017   Procedure: COLONOSCOPY WITH PROPOFOL;  Surgeon: Daneil Dolin, MD;  Location: [facility] ENDO SUITE;  Service: Endoscopy;  Laterality: N/A;  2:15pm  . ESOPHAGOGASTRODUODENOSCOPY  2006   Dr. Gala Romney: Subtle Schatzkis ring, otherwise normal upper GI tract, aside from a small pyloric channell erosion, status post dilation as described above.   Marland Kitchen FLEXIBLE SIGMOIDOSCOPY N/A 11/11/2017   Procedure: FLEXIBLE SIGMOIDOSCOPY;  Surgeon: Otis Brace, MD;  Location: [place];  Service: Gastroenterology;  Laterality: N/A;  . JOINT REPLACEMENT    . LUMBAR DISC SURGERY  X 2  . POLYPECTOMY  06/24/2017   Procedure: POLYPECTOMY;  Surgeon: Daneil Dolin, MD;  Location: [facility] END[facility];  Service: Endoscopy;;  ascending  . POSTERIOR LUMBAR FUSION  ? date 1st fusion ; 07/2006   previous L4-5 Ray threaded fusion cage; Exploration of L4-5 fusion & PLIF 07/22/2006/notes 07/22/2006  . TONSILLECTOMY    . TOTAL KNEE ARTHROPLASTY Right 05/24/2013   Procedure: TOTAL KNEE ARTHROPLASTY;  Surgeon: [name], MD;  Location: [place];  Service: Orthopedics;  Laterality: Right;  . TOTAL KNEE ARTHROPLASTY Left   . VAGINAL HYSTERECTOMY       OB History   No obstetric history on file.      Home Medications    Prior to Admission medications   Medication Sig Start Date End Date Taking? Authorizing Provider  albuterol (PROVENTIL HFA;VENTOLIN HFA) 108 (90 Base) MCG/ACT inhaler Inhale 2 puffs into the lungs every 6 (six) hours as needed (for wheezing/shortness of breath). Patient taking differently: Inhale 2 puffs into the lungs every 4 (four) hours as needed (for wheezing/shortness of breath).  12/08/17  Yes Angiulli, Lavon Paganini, PA-C  ALPRAZolam Duanne Moron) 0.5 MG tablet Take 1 tablet (0.5 mg total) by mouth at bedtime. 12/22/17  Yes Rehman, Areeg N, DO  apixaban (ELIQUIS) 5 MG TABS tablet Take 1 tablet (5 mg total) by mouth 2 (two) times  daily. 12/08/17  Yes Angiulli, Lavon Paganini, PA-C  cyclobenzaprine (FLEXERIL) 5 MG tablet Take 1 tablet (5 mg total) by mouth at bedtime. 12/08/17  Yes Angiulli, Lavon Paganini, PA-C  esomeprazole (NEXIUM) 40 MG capsule Take 40 mg by mouth daily at 12 noon.   Yes [provider]  levothyroxine (LEVOTHROID) 50 MCG tablet Take 1 tablet (50 mcg total) by mouth daily before breakfast. 12/08/17  Yes Angiulli, Lavon Paganini, PA-C  metoprolol succinate (TOPROL-XL) 25 MG 24 hr tablet Take 25 mg by mouth 2 (two) times a day.    Yes [provider]  naproxen (NAPROSYN) 500 MG tablet Take 500 mg by mouth 2 (two) times daily as needed for mild pain or moderate pain (with food/milk).   Yes [provider]  potassium chloride SA (K-DUR,KLOR-CON) 20 MEQ tablet Take 1 tablet (20 mEq total)  by mouth daily. Patient taking differently: Take 20 mEq by mouth 2 (two) times daily.  12/08/17  Yes Angiulli, Lavon Paganini, PA-C  topiramate (TOPAMAX) 25 MG tablet Take 1 tablet (25 mg total) by mouth 2 (two) times daily. 12/08/17  Yes Angiulli, Lavon Paganini, PA-C  torsemide (DEMADEX) 20 MG tablet Take 40 mg by mouth daily.   Yes [provider]  Vitamin D, Ergocalciferol, (DRISDOL) 1.25 MG (50000 UT) CAPS capsule Take 50,000 Units by mouth 2 (two) times a week.   Yes [provider]    Family History Family History  Problem Relation Age of Onset  . Cancer Other   . Diabetes Other   . Heart disease Other   . Hypertension Other   . Asthma Other   . Alcoholism Other   . Thyroid disease Other   . Rheumatologic disease Other   . Stroke Other   . Hypercholesterolemia Other   . Colon cancer Neg Hx     Social History Social History   Tobacco Use  . Smoking status: Former Smoker    Packs/day: 0.50    Years: 10.00    Pack years: 5.00    Types: Cigarettes    Quit date: 04/21/1987    Years since quitting: 31.3  . Smokeless tobacco: Never Used  . Tobacco comment: smokes cigarette if she has a  headache which helps.  Substance Use Topics  . Alcohol use: Not Currently    Comment: previously 2 glasses of wine a day; stopped in 2017 (12/20/2017  . Drug use: Never     Allergies   Penicillins and Sulfa antibiotics   Review of Systems Review of Systems  All other systems reviewed and are negative.    Physical Exam Updated Vital Signs BP (!) 154/73   Pulse (!) 51   Temp 98 F (36.7 C) (Oral)   Resp 16   Ht 5\' 1"  (1.549 m)   Wt 74.8 kg   SpO2 94%   BMI 31.18 kg/m   Physical Exam Vitals signs and nursing note reviewed.  Constitutional:      Appearance: She is well-developed.  HENT:     Head: Normocephalic and atraumatic.  Eyes:     Conjunctiva/sclera: Conjunctivae normal.  Neck:     Musculoskeletal: Neck supple.  Cardiovascular:     Rate and Rhythm: Normal rate and regular rhythm.  Pulmonary:     Effort: Pulmonary effort is normal.     Breath sounds: Normal breath sounds.  Abdominal:     General: Bowel sounds are normal.     Palpations: Abdomen is soft.  Musculoskeletal: Normal range of motion.  Skin:    General: Skin is warm and dry.  Neurological:     Mental Status: She is alert and oriented to person, place, and time.     Cranial Nerves: No dysarthria.     Sensory: No sensory deficit.     Comments: Right arm and right leg with diminished strength (old)  Psychiatric:        Behavior: Behavior normal.      ED Treatments / Results  Labs (all labs ordered are listed, but only abnormal results are displayed) Labs Reviewed  CBC WITH DIFFERENTIAL/PLATELET - Abnormal; Notable for the following components:      Result Value   RBC 3.68 (*)    Hemoglobin 11.7 (*)    All other components within normal limits  COMPREHENSIVE METABOLIC PANEL - Abnormal; Notable for the following components:   Glucose, Bld 108 (*)  Creatinine, Ser 1.31 (*)    AST 12 (*)    GFR calc non Af Amer 41 (*)    GFR calc Af Amer 47 (*)    All other components within normal  limits  SEDIMENTATION RATE - Abnormal; Notable for the following components:   Sed Rate 24 (*)    All other components within normal limits  APTT  PROTIME-INR    EKG None  Radiology Ct Head Wo Contrast  Result Date: 09/05/2018 CLINICAL DATA:  Acute, severe headache, the worst of the patient's life. EXAM: CT HEAD WITHOUT CONTRAST TECHNIQUE: Contiguous axial images were obtained from the base of the skull through the vertex without intravenous contrast. COMPARISON:  03/10/2018. FINDINGS: Brain: Stable mildly enlarged ventricles and cortical sulci. Stable marked patchy white matter low density in both cerebral hemispheres. Stable old left basal ganglia and thalamic infarct. No intracranial hemorrhage, mass lesion or CT evidence of acute infarction. Vascular: No hyperdense vessel or unexpected calcification. Skull: Normal. Negative for fracture or focal lesion. Sinuses/Orbits: Status post bilateral cataract extraction. Unremarkable bones and included paranasal sinuses. Other: None. IMPRESSION: 1. No acute abnormality. 2. Stable atrophy, chronic small vessel white matter ischemic changes and old left basal ganglia and thalamic infarct. Electronically Signed   By: Claudie Revering M.D.   On: 09/05/2018 15:06    Procedures Procedures (including critical care time)  Medications Ordered in ED Medications  sodium chloride 0.9 % bolus 500 mL (0 mLs Intravenous Stopped 09/05/18 1731)  diphenhydrAMINE (BENADRYL) injection 12.5 mg (12.5 mg Intravenous Given 09/05/18 1639)  fentaNYL (SUBLIMAZE) injection 25 mcg (25 mcg Intravenous Given 09/05/18 1640)  metoCLOPramide (REGLAN) injection 5 mg (5 mg Intravenous Given 09/05/18 1639)     Initial Impression / Assessment and Plan / ED Course  I have reviewed the triage vital signs and the nursing notes.  Pertinent labs & imaging results that were available during my care of the patient were reviewed by me and considered in my medical decision making (see chart for  details).        Patient presents with left temporal headache.  Normal neuro exam with known right arm and leg deficit.  Will give IV fluids, pain meds, CT head.  Patient rechecked prior to discharge.  CT head negative.  She is feeling better after IV fluids and meds.  No change in neuro status.  Final Clinical Impressions(s) / ED Diagnoses   Final diagnoses:  Acute intractable headache, unspecified headache type    ED Discharge Orders    None       Nat Christen, MD 09/05/18 1659    Nat Christen, MD 09/05/18 2152

## 2018-09-05 NOTE — Discharge Instructions (Addendum)
CT scan of head showed no acute findings.  Follow-up with your primary care doctor.

## 2018-09-05 NOTE — ED Notes (Signed)
Patient transported to CT 

## 2018-09-05 NOTE — Therapy (Addendum)
[facility] [address] [place], [place], [address] Phone: [phone]   Fax:  [phone]  Physical Therapy Treatment  Patient Details  Name: Holly Flores MRN: [id number] Date of Birth: 20-Aug-1945 Referring Provider (PT): Octavio Graves, DO   Encounter Date: 09/05/2018  PT End of Session - 09/05/18 1333    Visit Number  14   no charge   Number of Visits  26    Date for PT Re-Evaluation  10/18/18    Authorization Type  Progress note every 10th visit; KX modifier at 15th visit.    PT Start Time  1300    PT Stop Time  1340    PT Time Calculation (min)  40 min    Activity Tolerance  Treatment limited secondary to medical complications (Comment)   elevated BP and HA with wooziness   Behavior During Therapy  [facility] for tasks assessed/performed       Past Medical History:  Diagnosis Date  . Anxiety   . Arthritis    "all over" (12/20/2017)  . Central retinal vein occlusion of left eye 01/22/2015  . CHF (congestive heart failure) ([place])   . Chronic bronchitis ([place])    "get it most years" (12/20/2017)  . Chronic lower back pain   . CKD (chronic kidney disease), stage II   . COPD (chronic obstructive pulmonary disease) ([place])   . CVA (cerebral vascular accident) ([place]) 12/30/2014   OCULAR  LEFT  EYE   . Depression   . Fibromyalgia   . GERD (gastroesophageal reflux disease)   . Goiter   . History of colonic polyps 05/14/2014  . Hyperlipidemia   . Hypertension   . Hypothyroidism   . Kidney cysts    left side  . 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 Migraine    "couple times/wk; been having them for a long time" (12/20/2017)  . Myocardial infarction [facility])    "one dr said I did; one said I didn't;  don't remember when it was" (12/20/2017)  . 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 On home oxygen therapy    "3L; sleep w/it q night" (12/20/2017)  . Pneumonia    "several times"  (12/20/2017)  . Shortness of breath   . Sleep apnea    "stopped mask when I got on the oxygen" (12/20/2017)  . Stroke [facility])     Past Surgical History:  Procedure Laterality Date  . ANTERIOR CERVICAL DECOMP/DISCECTOMY FUSION    . BACK SURGERY    . BREAST BIOPSY     left-non cancerous  . CARDIAC CATHETERIZATION    . CARPAL TUNNEL RELEASE Left   . CATARACT EXTRACTION W/PHACO  09/21/2011   Procedure: CATARACT EXTRACTION PHACO AND INTRAOCULAR LENS PLACEMENT (IOC);  Surgeon: Williams Che, MD;  Location: [facility];  Service: Ophthalmology;  Laterality: Left;  CDE:9.78  . CATARACT EXTRACTION W/PHACO Right 10/15/2014   Procedure: CATARACT EXTRACTION PHACO AND INTRAOCULAR LENS PLACEMENT ([place]);  Surgeon: Williams Che, MD;  Location: [facility];  Service: Ophthalmology;  Laterality: Right;  CDE:4.19  . [place]; 1972  . CHOLECYSTECTOMY OPEN    . COLONOSCOPY  2006   RMR: 1. Internal hemorroids, otherwise normal rectum. 2. Pedunculated polyp at 35 cm. reomved with snare. The remainder of teh colonic mucosa appeared normal.   . COLONOSCOPY  2011   Dr. Gala Romney: multiple ascending colon polyps and rectal polyp, adenomatous  . COLONOSCOPY N/A 05/31/2014  Procedure: COLONOSCOPY;  Surgeon: Daneil Dolin, MD;  Location: [facility];  Service: Endoscopy;  Laterality: N/A;  130  . COLONOSCOPY WITH PROPOFOL N/A 06/24/2017   Procedure: COLONOSCOPY WITH PROPOFOL;  Surgeon: Daneil Dolin, MD;  Location: [facility];  Service: Endoscopy;  Laterality: N/A;  2:15pm  . ESOPHAGOGASTRODUODENOSCOPY  2006   Dr. Gala Romney: Subtle Schatzkis ring, otherwise normal upper GI tract, aside from a small pyloric channell erosion, status post dilation as described above.   Marland Kitchen FLEXIBLE SIGMOIDOSCOPY N/A 11/11/2017   Procedure: FLEXIBLE SIGMOIDOSCOPY;  Surgeon: Otis Brace, MD;  Location: [place];  Service: Gastroenterology;  Laterality: N/A;  . JOINT REPLACEMENT    . LUMBAR DISC SURGERY  X 2  . POLYPECTOMY   06/24/2017   Procedure: POLYPECTOMY;  Surgeon: Daneil Dolin, MD;  Location[facility];  Service: Endoscopy;;  ascending  . POSTERIOR LUMBAR FUSION  ? date 1st fusion ; 07/2006   previous L4-5 Ray threaded fusion cage; Exploration of L4-5 fusion & PLIF 07/22/2006/notes 07/22/2006  . TONSILLECTOMY    . TOTAL KNEE ARTHROPLASTY Right 05/24/2013   Procedure: TOTAL KNEE ARTHROPLASTY;  Surgeon: [name], MD;  Location: [place];  Service: Orthopedics;  Laterality: Right;  . TOTAL KNEE ARTHROPLASTY Left   . VAGINAL HYSTERECTOMY      Vitals:   09/05/18 1307 09/05/18 1313 09/05/18 1321  BP: (!) 155/89 (!) 160/93 (!) 158/86  Pulse: (!) 58 62   last BP was assessed with manual cuff.   Subjective Assessment - 09/05/18 1307    Subjective  Pt stated she should have cancelled today, as she has a really bad HA. Pt's other dtr Caryl Pina) had to bring pt today as her son is out of town and requested assistance to txf pt from car to w/c. However, when PT and other PT went to assist with txf, pt was already in the lobby. Pt reported her husband went to ED this morning 2/2 pancreatic CA and she's worried. Pt reported she just remembered she didn't take morning BP medication.    Pertinent History  left sided CVA 10/29/17, Chronic CHF, AFib, COPD, Chronic Kidney Disease stage III, hx of bil TKA, Lumbar surgery    Patient Stated Goals  move around better in the home and improve function    Currently in Pain?  Yes    Pain Score  4     Pain Location  Head    Pain Orientation  Left   temple area   Pain Descriptors / Indicators  Headache    Pain Type  Acute pain    Pain Onset  Today    Pain Frequency  Constant    Aggravating Factors   nothing    Pain Relieving Factors  nothing-Aleve did not help                                 PT Short Term Goals - 08/19/18 0913      PT SHORT TERM GOAL #1   Title  Pt and family will be independent with supine, seated and standing HEP    Time  4     Status  New    Target Date  09/18/18      PT SHORT TERM GOAL #2   Title  Pt will perform slideboard transfers to left and right on level surfaces with min A of family    Baseline  Mod  to R, Min to L    Time  4    Period  Weeks    Status  New    Target Date  09/18/18      PT SHORT TERM GOAL #3   Title  Pt will perform bed mobility on flat mat with mod A (supine <> sit and rolling)    Time  4    Period  Weeks    Status  New    Target Date  09/18/18      PT SHORT TERM GOAL #4   Title  Pt will perform sit > stand pushing from wheelchair with mod A on R side    Time  4    Period  Weeks    Status  New    Target Date  09/18/18      PT SHORT TERM GOAL #5   Title  Pt will ambulate x 25' with RW with hand orthosis, trial AFO and mod-max A of one therapist    Time  4    Period  Weeks    Status  Revised    Target Date  09/18/18        PT Long Term Goals - 08/19/18 0916      PT LONG TERM GOAL #1   Title  Patient and family will be independent with final HEP recommendations    Time  8    Period  Weeks    Status  Revised    Target Date  10/18/18      PT LONG TERM GOAL #2   Title  Patient will perform sit <> stand from wheelchair with min A and transfer stand pivot with RW and AFO with Mod A of one person    Time  8    Period  Weeks    Status  Revised    Target Date  10/18/18      PT LONG TERM GOAL #3   Title  Pt will perform bed mobility on flat mat with min A    Time  8    Period  Weeks    Status  Revised    Target Date  10/18/18      PT LONG TERM GOAL #4   Title  Family will demonstrate independent ability to don appropriate AFO with R shoe    Time  8    Period  Weeks    Status  New    Target Date  10/18/18      PT LONG TERM GOAL #5   Title  Patient will ambulate 40 feet or greater with min A, AFO, and LRAD to safely navigate around home.    Time  8    Period  Weeks    Status  Revised    Target Date  10/18/18            Plan - 09/05/18 1333     Clinical Impression Statement  No charge for today 2/2 elevated BP with HA and wooziness. Pt's PCP, Dr. Melina Copa, contacted and Dr. Melina Copa told PT to have pt go home and take BP medication. PT provided pt with Dr. Carmie End recommendation and encouraged pt to monitor BP at home and to go to the hospital if BP remains elevated with HA and wooziness. Pt's dtr also notified. PT +2 assist to txf pt to car.   PT Next Visit Plan  Did they get more PT scheduled for August??  Stedy transfers and sit <> stand on Stedy without  UE support.  Seated on large rockerboard or inverted BOSU performing lateral tilts or anterior/posterior for sit <> stand.  Work on decreasing assistance needed to perform level slideboard transfers to L and R.  Work on bed mobility on flat bed.  If you have skilled +2 (another PT or Vania Rea!!) Gait at // bars with [place] blue rocker AFO; gait with RW if have two people.  Add to HEP: WB exercises for mm activation    Consulted and Agree with Plan of Care  Patient       Patient will benefit from skilled therapeutic intervention in order to improve the following deficits and impairments:  Hypomobility  Visit Diagnosis: 1. 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 Physical deconditioning 12/19/2017  . Chronic kidney disease (CKD), stage III (moderate) (HCC)   . Hypoalbuminemia due to protein-calorie malnutrition ([place])   . Intractable migraine without status migrainosus   . Reactive depression   . Hemiparesis affecting right side as late effect of stroke ([place])   . Slow transit constipation   . PAF (paroxysmal atrial fibrillation) ([place])   . Dysphasia, post-stroke   . COPD (chronic obstructive pulmonary disease) ([place]) 10/29/2017  . Chronic diastolic CHF (congestive heart failure) ([place]) 10/29/2017  . Hypothyroidism 10/29/2017  . Essential hypertension 10/29/2017  . GERD  (gastroesophageal reflux disease) 10/29/2017  . Anxiety 10/29/2017  . 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 Hyperlipidemia 04/20/2013    Daisia Slomski L 09/05/2018, 2:07 PM  [facility] [address] [place], [place], [address] Phone: [phone]   Fax:  [phone]  Name: TERECIA PLAUT MRN: [id number] Date of Birth: 04-12-1945  Geoffry Paradise, PT,DPT 09/05/18 2:07 PM Phone: [phone] Fax: [phone]

## 2018-09-05 NOTE — ED Triage Notes (Signed)
Pt presents to ED with complaints of headache which started at 0900 and hypertension. Pt states she was at therapy when it all started. Pt with history of stroke. LKW 0700

## 2018-09-07 ENCOUNTER — Ambulatory Visit: Payer: Medicare Other | Admitting: Physical Therapy

## 2018-09-12 ENCOUNTER — Other Ambulatory Visit: Payer: Self-pay

## 2018-09-12 ENCOUNTER — Ambulatory Visit: Payer: Medicare Other | Admitting: Physical Therapy

## 2018-09-12 ENCOUNTER — Encounter: Payer: Self-pay | Admitting: Physical Therapy

## 2018-09-12 VITALS — BP 173/102 | HR 55

## 2018-09-12 DIAGNOSIS — M6281 Muscle weakness (generalized): Secondary | ICD-10-CM | POA: Diagnosis not present

## 2018-09-12 DIAGNOSIS — I69351 Hemiplegia and hemiparesis following cerebral infarction affecting right dominant side: Secondary | ICD-10-CM

## 2018-09-12 DIAGNOSIS — R29818 Other symptoms and signs involving the nervous system: Secondary | ICD-10-CM

## 2018-09-12 DIAGNOSIS — R262 Difficulty in walking, not elsewhere classified: Secondary | ICD-10-CM

## 2018-09-12 DIAGNOSIS — R293 Abnormal posture: Secondary | ICD-10-CM

## 2018-09-12 NOTE — Therapy (Signed)
[facility] [address] [place], [place], [address] Phone: [phone]   Fax:  [phone]  Physical Therapy Treatment  Patient Details  Name: Holly Flores MRN: [id number] Date of Birth: 22-Jun-1945 Referring Provider (PT): Octavio Graves, DO   Encounter Date: 09/12/2018   CLINIC OPERATION CHANGES: Outpatient [facility] is open at lower capacity following universal masking, social distancing, and patient screening.  The patient's COVID risk of complications score is 6.   PT End of Session - 09/12/18 1650    Visit Number  15    Number of Visits  26    Date for PT Re-Evaluation  10/18/18    Authorization Type  Progress note every 10th visit; KX modifier at 15th visit.    PT Start Time  1401    PT Stop Time  1448    PT Time Calculation (min)  47 min    Activity Tolerance  Treatment limited secondary to medical complications (Comment)   elevated BP and HA with wooziness   Behavior During Therapy  [facility] for tasks assessed/performed       Past Medical History:  Diagnosis Date  . Anxiety   . Arthritis    "all over" (12/20/2017)  . Central retinal vein occlusion of left eye 01/22/2015  . CHF (congestive heart failure) ([place])   . Chronic bronchitis ([place])    "get it most years" (12/20/2017)  . Chronic lower back pain   . CKD (chronic kidney disease), stage II   . COPD (chronic obstructive pulmonary disease) ([place])   . CVA (cerebral vascular accident) ([place]) 12/30/2014   OCULAR  LEFT  EYE   . Depression   . Fibromyalgia   . GERD (gastroesophageal reflux disease)   . Goiter   . History of colonic polyps 05/14/2014  . Hyperlipidemia   . Hypertension   . Hypothyroidism   . Kidney cysts    left side  . 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 Migraine    "couple times/wk; been having them for a long time" (12/20/2017)  . Myocardial infarction [facility])    "one dr said I did; one  said I didn't;  don't remember when it was" (12/20/2017)  . 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 On home oxygen therapy    "3L; sleep w/it q night" (12/20/2017)  . Pneumonia    "several times" (12/20/2017)  . Shortness of breath   . Sleep apnea    "stopped mask when I got on the oxygen" (12/20/2017)  . Stroke [facility])     Past Surgical History:  Procedure Laterality Date  . ANTERIOR CERVICAL DECOMP/DISCECTOMY FUSION    . BACK SURGERY    . BREAST BIOPSY     left-non cancerous  . CARDIAC CATHETERIZATION    . CARPAL TUNNEL RELEASE Left   . CATARACT EXTRACTION W/PHACO  09/21/2011   Procedure: CATARACT EXTRACTION PHACO AND INTRAOCULAR LENS PLACEMENT (IOC);  Surgeon: Williams Che, MD;  Location: [facility];  Service: Ophthalmology;  Laterality: Left;  CDE:9.78  . CATARACT EXTRACTION W/PHACO Right 10/15/2014   Procedure: CATARACT EXTRACTION PHACO AND INTRAOCULAR LENS PLACEMENT ([place]);  Surgeon: Williams Che, MD;  Location: [facility];  Service: Ophthalmology;  Laterality: Right;  CDE:4.19  . [place]; 1972  . CHOLECYSTECTOMY OPEN    . COLONOSCOPY  2006   RMR: 1. Internal hemorroids, otherwise normal rectum. 2. Pedunculated polyp at 35 cm. reomved with snare. The remainder  of teh colonic mucosa appeared normal.   . COLONOSCOPY  2011   Dr. Gala Romney: multiple ascending colon polyps and rectal polyp, adenomatous  . COLONOSCOPY N/A 05/31/2014   Procedure: COLONOSCOPY;  Surgeon: Daneil Dolin, MD;  Location: [facility];  Service: Endoscopy;  Laterality: N/A;  130  . COLONOSCOPY WITH PROPOFOL N/A 06/24/2017   Procedure: COLONOSCOPY WITH PROPOFOL;  Surgeon: Daneil Dolin, MD;  Location: [facility];  Service: Endoscopy;  Laterality: N/A;  2:15pm  . ESOPHAGOGASTRODUODENOSCOPY  2006   Dr. Gala Romney: Subtle Schatzkis ring, otherwise normal upper GI tract, aside from a small pyloric channell erosion, status post dilation as described above.   Marland Kitchen FLEXIBLE SIGMOIDOSCOPY N/A 11/11/2017   Procedure:  FLEXIBLE SIGMOIDOSCOPY;  Surgeon: Otis Brace, MD;  Location: [facility];  Service: Gastroenterology;  Laterality: N/A;  . JOINT REPLACEMENT    . LUMBAR DISC SURGERY  X 2  . POLYPECTOMY  06/24/2017   Procedure: POLYPECTOMY;  Surgeon: Daneil Dolin, MD;  Location: [facility];  Service: Endoscopy;;  ascending  . POSTERIOR LUMBAR FUSION  ? date 1st fusion ; 07/2006   previous L4-5 Ray threaded fusion cage; Exploration of L4-5 fusion & PLIF 07/22/2006/notes 07/22/2006  . TONSILLECTOMY    . TOTAL KNEE ARTHROPLASTY Right 05/24/2013   Procedure: TOTAL KNEE ARTHROPLASTY;  Surgeon: [name], MD;  Location: [facility];  Service: Orthopedics;  Laterality: Right;  . TOTAL KNEE ARTHROPLASTY Left   . VAGINAL HYSTERECTOMY      Vitals:   09/12/18 1408  BP: (!) 173/102  Pulse: (!) 55    Subjective Assessment - 09/12/18 1406    Subjective  Pt no longer experiencing HA; BP has been better.  CT at hospital did not show any new event/infarct.  Pt's husband is home and is doing better but is going through treatment for CA.  Son needs to discuss appointments in August due him returning to work and less able to assist with transportation. Pt was cast for plastic AFO today.    Pertinent History  left sided CVA 10/29/17, Chronic CHF, AFib, COPD, Chronic Kidney Disease stage III, hx of bil TKA, Lumbar surgery    Patient Stated Goals  move around better in the home and improve function    Currently in Pain?  No/denies    Pain Onset  Today                       [facility] Adult PT Treatment/Exercise - 09/12/18 1638      Transfers   Lateral/Scoot Transfers  4: Min assist    Lateral/Scoot Transfer Details (indicate cue type and reason)  with slideboard w/c <> mat uphill to R, downhill to L with therapist providing stabilization at R knee and cues for use of head-hips relationship and lateral leaning during scooting for improved efficiency with transfer.  Pt demonstrated improved upright trunk and  trunk control during transfers today and decreased assistance to advance hips      Therapeutic Activites    Therapeutic Activities  Other Therapeutic Activities    Other Therapeutic Activities  Pt arrived with elevated BP.  Denied symptoms of HA, feeling woozy, lightheaded or changes in vision.  BP remained stable through entire session.  Educated pt and son on effects of sodium on HTN; pt reports she had a large fried chicken sandwich for lunch today.  Unable to determine if patient's lunch influenced her BP today but discussed with pt other food options  with less sodium.        Neuro Re-ed    Neuro Re-ed Details   Seated on mat with LLE supported on 2" step and R foot on skate with pelvis wedged on R side to promote trunk shortening on R and weight shift to L.  Attempted to perform gravity minimized activation of R quad and hamstring mm groups.  Pt able to intermittently activate quads with visual cues but unable to activate hamstrings.  Placed a mirror between patient's LE and had pt focus on image of LLE in mirror (appears as RLE) and then led pt through 15 minutes of repetitive LE movements - ankle DF/PF, knee flexion/extension, hip flexion, hip IR/ER.  Then therapist assisted actual RLE through these movements as pt continued to focus on image in mirror.  With use of mirror pt demonstrated increased volitional activation of R quads, hamstrings and hip adductors/IR.  When mirror was removed pt unable to activate RLE.             PT Education - 09/12/18 1648    Education Details  will try to find more visits in August for patient.  Role of sodium on HTN.  Lower intensity activities today due to asymptomatic HTN - mirror therapy    Person(s) Educated  Patient;Child(ren)    Methods  Explanation;Demonstration    Comprehension  Verbalized understanding       PT Short Term Goals - 08/19/18 0913      PT SHORT TERM GOAL #1   Title  Pt and family will be independent with supine, seated and  standing HEP    Time  4    Status  New    Target Date  09/18/18      PT SHORT TERM GOAL #2   Title  Pt will perform slideboard transfers to left and right on level surfaces with min A of family    Baseline  Mod to R, Min to L    Time  4    Period  Weeks    Status  New    Target Date  09/18/18      PT SHORT TERM GOAL #3   Title  Pt will perform bed mobility on flat mat with mod A (supine <> sit and rolling)    Time  4    Period  Weeks    Status  New    Target Date  09/18/18      PT SHORT TERM GOAL #4   Title  Pt will perform sit > stand pushing from wheelchair with mod A on R side    Time  4    Period  Weeks    Status  New    Target Date  09/18/18      PT SHORT TERM GOAL #5   Title  Pt will ambulate x 25' with RW with hand orthosis, trial AFO and mod-max A of one therapist    Time  4    Period  Weeks    Status  Revised    Target Date  09/18/18        PT Long Term Goals - 08/19/18 0916      PT LONG TERM GOAL #1   Title  Patient and family will be independent with final HEP recommendations    Time  8    Period  Weeks    Status  Revised    Target Date  10/18/18      PT LONG TERM GOAL #  2   Title  Patient will perform sit <> stand from wheelchair with min A and transfer stand pivot with RW and AFO with Mod A of one person    Time  8    Period  Weeks    Status  Revised    Target Date  10/18/18      PT LONG TERM GOAL #3   Title  Pt will perform bed mobility on flat mat with min A    Time  8    Period  Weeks    Status  Revised    Target Date  10/18/18      PT LONG TERM GOAL #4   Title  Family will demonstrate independent ability to don appropriate AFO with R shoe    Time  8    Period  Weeks    Status  New    Target Date  10/18/18      PT LONG TERM GOAL #5   Title  Patient will ambulate 40 feet or greater with min A, AFO, and LRAD to safely navigate around home.    Time  8    Period  Weeks    Status  Revised    Target Date  10/18/18             Plan - 09/12/18 1654    Clinical Impression Statement  Pt continues to experienced elevated BP today but asymptomatic.  She also reported eating a fried chicken sandwich for lunch which may have contributed to her elevated BP.  Will continue to monitor at each visit.  Due to elevated BP today, treatment session focused on use of seated mirror therapy to facilitate increased activation in the RLE in gravity minimized positions but without back support for increased activation of core and postural muscles.  When visualizing RLE in mirror pt able to activate multiple mm groups in RLE; when mirror removed pt unable to activate these mm groups.  Will continue to address in combination with WB activity to continue to progress towards LTG.    Personal Factors and Comorbidities  Age;Comorbidity 3+;Time since onset of injury/illness/exacerbation    Examination-Activity Limitations  Lift;Dressing;Self Feeding;Transfers;Toileting;Stand;Locomotion Level;Reach Overhead;Bed Mobility    Rehab Potential  Fair    PT Frequency  2x / week    PT Duration  8 weeks    PT Treatment/Interventions  ADLs/Self Care Home Management;Electrical Stimulation;Gait training;Stair training;Functional mobility training;Balance training;Therapeutic exercise;Therapeutic activities;Neuromuscular re-education;Wheelchair mobility training;Manual techniques;Passive range of motion;Patient/family education;DME Instruction;Energy conservation;Orthotic Fit/Training    PT Next Visit Plan  MONITOR BP EVERY SESSION WITH MANUAL CUFF ON LUE; Check STG.  Seated mirror therapy for LE.  Stedy transfers and sit <> stand on Stedy without UE support.  Seated on large rockerboard or inverted BOSU performing lateral tilts or anterior/posterior for sit <> stand.  When she receives her AFO begin using for transfers, standing and gait.  Work on decreasing assistance needed to perform level slideboard transfers to L and R.  Work on bed mobility on flat  bed.  Add to HEP: WB exercises for mm activation    Recommended Other Services  Looking into getting Stedy for transfers at home.    Consulted and Agree with Plan of Care  Patient;Family member/caregiver    Family Member Consulted  son       Patient will benefit from skilled therapeutic intervention in order to improve the following deficits and impairments:  Pain, Postural dysfunction, Impaired tone, Decreased mobility, Decreased activity tolerance, Decreased range  of motion, Decreased strength, Impaired UE functional use, Difficulty walking, Decreased balance, Abnormal gait, Decreased endurance  Visit Diagnosis: 1. 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 Muscle weakness (generalized)   3. Other symptoms and signs involving the nervous system   4. Difficulty in walking, not elsewhere classified   5. Abnormal posture        Problem List Patient Active Problem List   Diagnosis Date Noted  . Physical deconditioning 12/19/2017  . Chronic kidney disease (CKD), stage III (moderate) ([facility])   . Hypoalbuminemia due to protein-calorie malnutrition ([place])   . Intractable migraine without status migrainosus   . Reactive depression   . Hemiparesis affecting right side as late effect of stroke ([place])   . Slow transit constipation   . PAF (paroxysmal atrial fibrillation) ([place])   . Dysphasia, post-stroke   . COPD (chronic obstructive pulmonary disease) ([place]) 10/29/2017  . Chronic diastolic CHF (congestive heart failure) ([place]) 10/29/2017  . Hypothyroidism 10/29/2017  . Essential hypertension 10/29/2017  . GERD (gastroesophageal reflux disease) 10/29/2017  . Anxiety 10/29/2017  . 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 Hyperlipidemia 04/20/2013    Rico Junker, PT, DPT 09/12/18    4:58 PM    [facility] [address] [place], [place], [address] Phone:  [phone]   Fax:  [phone]  Name: Holly Flores MRN: [id number] Date of Birth: [birth year]

## 2018-09-16 ENCOUNTER — Encounter

## 2018-09-20 ENCOUNTER — Other Ambulatory Visit: Payer: Self-pay

## 2018-09-20 ENCOUNTER — Ambulatory Visit: Payer: Medicare Other | Admitting: Speech Pathology

## 2018-09-20 ENCOUNTER — Ambulatory Visit: Payer: Medicare Other | Attending: *Deleted | Admitting: Occupational Therapy

## 2018-09-20 ENCOUNTER — Ambulatory Visit: Payer: Medicare Other | Admitting: Physical Therapy

## 2018-09-20 VITALS — BP 184/96

## 2018-09-20 DIAGNOSIS — R293 Abnormal posture: Secondary | ICD-10-CM

## 2018-09-20 DIAGNOSIS — R29818 Other symptoms and signs involving the nervous system: Secondary | ICD-10-CM

## 2018-09-20 DIAGNOSIS — I69351 Hemiplegia and hemiparesis following cerebral infarction affecting right dominant side: Secondary | ICD-10-CM | POA: Diagnosis not present

## 2018-09-20 DIAGNOSIS — R471 Dysarthria and anarthria: Secondary | ICD-10-CM

## 2018-09-20 DIAGNOSIS — R2689 Other abnormalities of gait and mobility: Secondary | ICD-10-CM | POA: Insufficient documentation

## 2018-09-20 DIAGNOSIS — M6281 Muscle weakness (generalized): Secondary | ICD-10-CM | POA: Insufficient documentation

## 2018-09-20 DIAGNOSIS — M25612 Stiffness of left shoulder, not elsewhere classified: Secondary | ICD-10-CM | POA: Diagnosis present

## 2018-09-20 DIAGNOSIS — R262 Difficulty in walking, not elsewhere classified: Secondary | ICD-10-CM | POA: Diagnosis present

## 2018-09-20 NOTE — Patient Instructions (Signed)
Abdominal Breathing exercises: 10 minutes, twice a day  Try lying down first: Think of your belly as a balloon, when you fill with air (inhale), the balloon gets bigger. As the air goes out (exhale), the balloon deflates.  Lay on your back with a plastic cup on your belly and repeat the above steps, watching you belly move up with inhalation and down with exhalations  If this goes well, you can try it sitting up: practice breathing in and out in front of a mirror, watching your belly Breathe in for a count of 5 and breathe out for a count of 5   . Shoulders down - this is a cue to relax . Sit up straight (shoulders over hips) . Place your hand on your abdomen - this helps you focus on easy abdominal breath support - the best and most relaxed way to breathe . Breathe in through your nose and fill your belly with air, watching your hand move outward . Breathe out through your mouth and watch your belly move in. An audible "sh"  may help  Pitch exercises  1. Practice 10 abdominal breaths  2. 10 Pitch glides up and down: "Knoll"   (remember to breathe!)  3. 10 sentences in loud high pitch voice, like you are calling your neighbor over the phone  4. 10 sentences in loud low authoritative pitch, like you are the boss  Use a good belly breath before each exercise- feel your abs contract in as you use your voice

## 2018-09-20 NOTE — Patient Instructions (Signed)
Warning Signs of a Stroke  A stroke is a medical emergency and should be treated right away-every second counts. A stroke is caused by a decrease or block in blood flow to the brain. When this occurs, certain areas of the brain do not get enough oxygen, and brain cells begin to die. A stroke can lead to brain damage and can sometimes be life-threatening. However, if someone having a stroke gets medical treatment right away, he or she has better chances of surviving and recovering from the stroke. Being able to recognize the symptoms of a stroke is very important. Types of strokes There are two main types of strokes:  Ischemic strokes. This is the most common type of stroke. These strokes happen when a blood vessel that supplies blood to the brain is being blocked.  Hemorrhagic strokes. These strokes result from bleeding in the brain due to a blood vessel leaking or bursting (rupturing). A transient ischemic attack (TIA) is a "warning stroke" that causes stroke-like symptoms that go away quickly. Unlike a stroke, a TIA does not cause permanent damage to the brain. However, the symptoms of a TIA are the same as a stroke, and they also require medical treatment right away. Having a TIA is a sign that you are at higher risk for a permanent stroke. Warning signs of a stroke The symptoms of stroke may vary and will reflect the part of the brain that is involved. Symptoms usually happen suddenly. "BE FAST" is an easy way to remember the main warning signs of a stroke. B - Balance Signs are dizziness, sudden trouble walking, or loss of balance. E - Eyes Signs are trouble seeing or a sudden change in vision. F - Face Signs are sudden weakness or numbness of the face, or the face or eyelid drooping on one side. A - Arms Signs are weakness or numbness in an arm. This happens suddenly and usually on one side of the body. S - Speech Signs are sudden trouble speaking, slurred speech, or trouble understanding  what people say. T - Time Time to call emergency services. Write down what time symptoms started. Other signs of a stroke Some less common signs of a stroke include:  A sudden, severe headache with no known cause.  Nausea or vomiting.  Seizure. A stroke may be happening even if only one "BE FAST" symptoms is present. These symptoms may represent a serious problem that is an emergency. Do not wait to see if the symptoms will go away. Get medical help right away. Call your local emergency services (911 in the [place] Do not drive yourself to the hospital. Summary  A stroke is a medical emergency and should be treated right away-every second counts.  "BE FAST" is an easy way to remember the main warning signs of a stroke.  Call local emergency services right away if you or someone else has any stroke symptoms, even if the symptoms go away.  Make note of what time the first symptoms appeared. Emergency responders or emergency room staff will need to know this information.  Do not wait to see if symptoms will go away. Call 911 even if only one of the "BE FAST" symptoms appears. This information is not intended to replace advice given to you by your health care provider. Make sure you discuss any questions you have with your health care provider. Document Released: 05/22/2016 Document Revised: 01/15/2017 Document Reviewed: 05/22/2016 Elsevier Patient Education  2020 [employer]  

## 2018-09-20 NOTE — Therapy (Signed)
[facility] [address] [place], [place], [address] Phone: [phone]   Fax:  [phone]  Physical Therapy Treatment  Patient Details  Name: Holly Flores MRN: [id number] Date of Birth: 24-Sep-1945 Referring Provider (PT): Octavio Graves, DO   Encounter Date: 09/20/2018  PT End of Session - 09/20/18 1432    Visit Number  16    Number of Visits  26    Date for PT Re-Evaluation  10/18/18    Authorization Type  Progress note every 10th visit; KX modifier at 15th visit.    PT Start Time  1235    PT Stop Time  1300    PT Time Calculation (min)  25 min    Activity Tolerance  Treatment limited secondary to medical complications (Comment)   elevated BP and earlier report of HA; treatment cut short   Behavior During Therapy  [facility] for tasks assessed/performed       Past Medical History:  Diagnosis Date  . Anxiety   . Arthritis    "all over" (12/20/2017)  . Central retinal vein occlusion of left eye 01/22/2015  . CHF (congestive heart failure) ([place])   . Chronic bronchitis ([place])    "get it most years" (12/20/2017)  . Chronic lower back pain   . CKD (chronic kidney disease), stage II   . COPD (chronic obstructive pulmonary disease) ([place])   . CVA (cerebral vascular accident) ([place]) 12/30/2014   OCULAR  LEFT  EYE   . Depression   . Fibromyalgia   . GERD (gastroesophageal reflux disease)   . Goiter   . History of colonic polyps 05/14/2014  . Hyperlipidemia   . Hypertension   . Hypothyroidism   . Kidney cysts    left side  . 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 Migraine    "couple times/wk; been having them for a long time" (12/20/2017)  . Myocardial infarction [facility])    "one dr said I did; one said I didn't;  don't remember when it was" (12/20/2017)  . 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 On home oxygen therapy    "3L; sleep w/it q night" (12/20/2017)  . Pneumonia    "several  times" (12/20/2017)  . Shortness of breath   . Sleep apnea    "stopped mask when I got on the oxygen" (12/20/2017)  . Stroke [facility])     Past Surgical History:  Procedure Laterality Date  . ANTERIOR CERVICAL DECOMP/DISCECTOMY FUSION    . BACK SURGERY    . BREAST BIOPSY     left-non cancerous  . CARDIAC CATHETERIZATION    . CARPAL TUNNEL RELEASE Left   . CATARACT EXTRACTION W/PHACO  09/21/2011   Procedure: CATARACT EXTRACTION PHACO AND INTRAOCULAR LENS PLACEMENT (IOC);  Surgeon: Williams Che, MD;  Location: [facility];  Service: Ophthalmology;  Laterality: Left;  CDE:9.78  . CATARACT EXTRACTION W/PHACO Right 10/15/2014   Procedure: CATARACT EXTRACTION PHACO AND INTRAOCULAR LENS PLACEMENT ([place]);  Surgeon: Williams Che, MD;  Location: [facility];  Service: Ophthalmology;  Laterality: Right;  CDE:4.19  . [place]; 1972  . CHOLECYSTECTOMY OPEN    . COLONOSCOPY  2006   RMR: 1. Internal hemorroids, otherwise normal rectum. 2. Pedunculated polyp at 35 cm. reomved with snare. The remainder of teh colonic mucosa appeared normal.   . COLONOSCOPY  2011   Dr. Gala Romney: multiple ascending colon polyps and rectal polyp, adenomatous  . COLONOSCOPY N/A  05/31/2014   Procedure: COLONOSCOPY;  Surgeon: Daneil Dolin, MD;  [facility];  Service: Endoscopy;  Laterality: N/A;  130  . COLONOSCOPY WITH PROPOFOL N/A 06/24/2017   Procedure: COLONOSCOPY WITH PROPOFOL;  Surgeon: Daneil Dolin, MD;  [facility];  Service: Endoscopy;  Laterality: N/A;  2:15pm  . ESOPHAGOGASTRODUODENOSCOPY  2006   Dr. Gala Romney: Subtle Schatzkis ring, otherwise normal upper GI tract, aside from a small pyloric channell erosion, status post dilation as described above.   Marland Kitchen FLEXIBLE SIGMOIDOSCOPY N/A 11/11/2017   Procedure: FLEXIBLE SIGMOIDOSCOPY;  Surgeon: Otis Brace, MD;  Location: [facility];  Service: Gastroenterology;  Laterality: N/A;  . JOINT REPLACEMENT    . LUMBAR DISC SURGERY  X 2  .  POLYPECTOMY  06/24/2017   Procedure: POLYPECTOMY;  Surgeon: Daneil Dolin, MD;  [facility];  Service: Endoscopy;;  ascending  . POSTERIOR LUMBAR FUSION  ? date 1st fusion ; 07/2006   previous L4-5 Ray threaded fusion cage; Exploration of L4-5 fusion & PLIF 07/22/2006/notes 07/22/2006  . TONSILLECTOMY    . TOTAL KNEE ARTHROPLASTY Right 05/24/2013   Procedure: TOTAL KNEE ARTHROPLASTY;  Surgeon: [name], MD;  Location: [facility];  Service: Orthopedics;  Laterality: Right;  . TOTAL KNEE ARTHROPLASTY Left   . VAGINAL HYSTERECTOMY      Vitals:   09/20/18 1252  BP: (!) 184/96    Subjective Assessment - 09/20/18 1239    Subjective  Daughter is present here today. No HA right now, had a headache this morning. Had a recent death in the family.    Pertinent History  left sided CVA 10/29/17, Chronic CHF, AFib, COPD, Chronic Kidney Disease stage III, hx of bil TKA, Lumbar surgery    Patient Stated Goals  move around better in the home and improve function    Pain Onset  Today                       [facility] Adult PT Treatment/Exercise - 09/20/18 1427      Transfers   Lateral/Scoot Transfers  3: Mod assist    Lateral/Scoot Transfer Details (indicate cue type and reason)  Pt received sitting on treatment mat at beginning of session. At end of session transferred from low mat table > w/c towards the R. Verbal cues for technique and head/hips relationship. Therapist providing stability to R knee and mod A to advance hips       Self-Care   Self-Care  Other Self-Care Comments    Other Self-Care Comments   Pt arrived with high BP, with no reports of headache, dizziness, blurred vision at start of therapy session. Pt states that she had a headache earlier this morning. D/t elevated BP (see vitals flowsheet), PT treatment was deferred at this time. Educated daughter and pt on the warning signs of a stroke and gave handout with 'BE FAST'.  Pt and daughter verbalized understanding and  daughter states she will share information with other care givers             PT Education - 09/20/18 1432    Education Details  education about warning signs of a stroke, BP contraindications to therapy    Person(s) Educated  Patient;Child(ren)   daughter   Methods  Explanation;Handout    Comprehension  Verbalized understanding       PT Short Term Goals - 08/19/18 0913      PT SHORT TERM GOAL #1   Title  Pt and family will be independent with supine, seated and standing HEP    Time  4    Status  New    Target Date  09/18/18      PT SHORT TERM GOAL #2   Title  Pt will perform slideboard transfers to left and right on level surfaces with min A of family    Baseline  Mod to R, Min to L    Time  4    Period  Weeks    Status  New    Target Date  09/18/18      PT SHORT TERM GOAL #3   Title  Pt will perform bed mobility on flat mat with mod A (supine <> sit and rolling)    Time  4    Period  Weeks    Status  New    Target Date  09/18/18      PT SHORT TERM GOAL #4   Title  Pt will perform sit > stand pushing from wheelchair with mod A on R side    Time  4    Period  Weeks    Status  New    Target Date  09/18/18      PT SHORT TERM GOAL #5   Title  Pt will ambulate x 25' with RW with hand orthosis, trial AFO and mod-max A of one therapist    Time  4    Period  Weeks    Status  Revised    Target Date  09/18/18        PT Long Term Goals - 08/19/18 0916      PT LONG TERM GOAL #1   Title  Patient and family will be independent with final HEP recommendations    Time  8    Period  Weeks    Status  Revised    Target Date  10/18/18      PT LONG TERM GOAL #2   Title  Patient will perform sit <> stand from wheelchair with min A and transfer stand pivot with RW and AFO with Mod A of one person    Time  8    Period  Weeks    Status  Revised    Target Date  10/18/18      PT LONG TERM GOAL #3   Title  Pt will perform bed mobility on flat mat with min A    Time  8     Period  Weeks    Status  Revised    Target Date  10/18/18      PT LONG TERM GOAL #4   Title  Family will demonstrate independent ability to don appropriate AFO with R shoe    Time  8    Period  Weeks    Status  New    Target Date  10/18/18      PT LONG TERM GOAL #5   Title  Patient will ambulate 40 feet or greater with min A, AFO, and LRAD to safely navigate around home.    Time  8    Period  Weeks    Status  Revised    Target Date  10/18/18            Plan - 09/21/18 1002    Clinical Impression Statement  Pt continues to have elevated BP today at rest in sitting with reported headache earlier, treatment limited today. Discussed with pt and daughter BP contraindications to participating in  PT and they verbalized understanding. D/t increased BP, provided handout on the warning signs of a future stroke (BE FAST) and advised daughter to continue taking BP readings at home.  Will continue to progress towards LTGs when pt's BP is [facility] for therapy.    Personal Factors and Comorbidities  Age;Comorbidity 3+;Time since onset of injury/illness/exacerbation    Examination-Activity Limitations  Lift;Dressing;Self Feeding;Transfers;Toileting;Stand;Locomotion Level;Reach Overhead;Bed Mobility    Rehab Potential  Fair    PT Frequency  2x / week    PT Duration  8 weeks    PT Treatment/Interventions  ADLs/Self Care Home Management;Electrical Stimulation;Gait training;Stair training;Functional mobility training;Balance training;Therapeutic exercise;Therapeutic activities;Neuromuscular re-education;Wheelchair mobility training;Manual techniques;Passive range of motion;Patient/family education;DME Instruction;Energy conservation;Orthotic Fit/Training    PT Next Visit Plan  MONITOR BP EVERY SESSION WITH MANUAL CUFF ON LUE; Check STG.  Seated mirror therapy for LE.  Stedy transfers and sit <> stand on Stedy without UE support.  Seated on large rockerboard or inverted BOSU performing lateral tilts or  anterior/posterior for sit <> stand.  When she receives her AFO begin using for transfers, standing and gait.  Work on decreasing assistance needed to perform level slideboard transfers to L and R.  Work on bed mobility on flat bed.  Add to HEP: WB exercises for mm activation    Consulted and Agree with Plan of Care  Patient;Family member/caregiver    Family Member Consulted  daughter       Patient will benefit from skilled therapeutic intervention in order to improve the following deficits and impairments:  Pain, Postural dysfunction, Impaired tone, Decreased mobility, Decreased activity tolerance, Decreased range of motion, Decreased strength, Impaired UE functional use, Difficulty walking, Decreased balance, Abnormal gait, Decreased endurance  Visit Diagnosis: 1. 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 Muscle weakness (generalized)   3. Abnormal posture   4. Other symptoms and signs involving the nervous system   5. Difficulty in walking, not elsewhere classified   6. Other abnormalities of gait and mobility        Problem List Patient Active Problem List   Diagnosis Date Noted  . Physical deconditioning 12/19/2017  . Chronic kidney disease (CKD), stage III (moderate) (HCC)   . Hypoalbuminemia due to protein-calorie malnutrition ([place])   . Intractable migraine without status migrainosus   . Reactive depression   . Hemiparesis affecting right side as late effect of stroke ([place])   . Slow transit constipation   . PAF (paroxysmal atrial fibrillation) ([place])   . Dysphasia, post-stroke   . COPD (chronic obstructive pulmonary disease) ([place]) 10/29/2017  . Chronic diastolic CHF (congestive heart failure) ([place]) 10/29/2017  . Hypothyroidism 10/29/2017  . Essential hypertension 10/29/2017  . GERD (gastroesophageal reflux disease) 10/29/2017  . Anxiety 10/29/2017  . 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 Hyperlipidemia  04/20/2013    Arliss Journey, PT, DPT 09/21/2018, 10:08 AM  [facility] [address] [place], [place], [address] Phone: [phone]   Fax:  [phone]  Name: LICIA HARL MRN: [id number] Date of Birth: March 28, 1945

## 2018-09-20 NOTE — Therapy (Signed)
[facility] [address] [place], [place], [address] Phone: [phone]   Fax:  [phone]  Speech Language Pathology Treatment  Patient Details  Name: Holly Flores MRN: [id number] Date of Birth: 28-Sep-1945 Referring Provider (SLP): Lubertha [place]. Melina Copa, DO   Encounter Date: 09/20/2018  End of Session - 09/20/18 1304    Visit Number  2    Number of Visits  9    Date for SLP Re-Evaluation  10/23/18    SLP Start Time  1111    SLP Stop Time   1144    SLP Time Calculation (min)  33 min    Activity Tolerance  Patient tolerated treatment well       Past Medical History:  Diagnosis Date  . Anxiety   . Arthritis    "all over" (12/20/2017)  . Central retinal vein occlusion of left eye 01/22/2015  . CHF (congestive heart failure) ([place])   . Chronic bronchitis ([place])    "get it most years" (12/20/2017)  . Chronic lower back pain   . CKD (chronic kidney disease), stage II   . COPD (chronic obstructive pulmonary disease) ([place])   . CVA (cerebral vascular accident) ([place]) 12/30/2014   OCULAR  LEFT  EYE   . Depression   . Fibromyalgia   . GERD (gastroesophageal reflux disease)   . Goiter   . History of colonic polyps 05/14/2014  . Hyperlipidemia   . Hypertension   . Hypothyroidism   . Kidney cysts    left side  . 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 Migraine    "couple times/wk; been having them for a long time" (12/20/2017)  . Myocardial infarction [facility])    "one dr said I did; one said I didn't;  don't remember when it was" (12/20/2017)  . 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 On home oxygen therapy    "3L; sleep w/it q night" (12/20/2017)  . Pneumonia    "several times" (12/20/2017)  . Shortness of breath   . Sleep apnea    "stopped mask when I got on the oxygen" (12/20/2017)  . Stroke [facility])     Past Surgical History:  Procedure Laterality Date  . ANTERIOR CERVICAL  DECOMP/DISCECTOMY FUSION    . BACK SURGERY    . BREAST BIOPSY     left-non cancerous  . CARDIAC CATHETERIZATION    . CARPAL TUNNEL RELEASE Left   . CATARACT EXTRACTION W/PHACO  09/21/2011   Procedure: CATARACT EXTRACTION PHACO AND INTRAOCULAR LENS PLACEMENT (IOC);  Surgeon: Williams Che, MD;  Location: [facility];  Service: Ophthalmology;  Laterality: Left;  CDE:9.78  . CATARACT EXTRACTION W/PHACO Right 10/15/2014   Procedure: CATARACT EXTRACTION PHACO AND INTRAOCULAR LENS PLACEMENT ([place]);  Surgeon: Williams Che, MD;  Location: [facility];  Service: Ophthalmology;  Laterality: Right;  CDE:4.19  . [place]; 1972  . CHOLECYSTECTOMY OPEN    . COLONOSCOPY  2006   RMR: 1. Internal hemorroids, otherwise normal rectum. 2. Pedunculated polyp at 35 cm. reomved with snare. The remainder of teh colonic mucosa appeared normal.   . COLONOSCOPY  2011   Dr. Gala Romney: multiple ascending colon polyps and rectal polyp, adenomatous  . COLONOSCOPY N/A 05/31/2014   Procedure: COLONOSCOPY;  Surgeon: Daneil Dolin, MD;  [facility];  Service: Endoscopy;  Laterality: N/A;  130  . COLONOSCOPY WITH PROPOFOL N/A 06/24/2017   Procedure: COLONOSCOPY WITH PROPOFOL;  Surgeon:  Rourk, Cristopher Estimable, MD;  [facility];  Service: Endoscopy;  Laterality: N/A;  2:15pm  . ESOPHAGOGASTRODUODENOSCOPY  2006   Dr. Gala Romney: Subtle Schatzkis ring, otherwise normal upper GI tract, aside from a small pyloric channell erosion, status post dilation as described above.   Marland Kitchen FLEXIBLE SIGMOIDOSCOPY N/A 11/11/2017   Procedure: FLEXIBLE SIGMOIDOSCOPY;  Surgeon: Otis Brace, MD;  Location: [facility];  Service: Gastroenterology;  Laterality: N/A;  . JOINT REPLACEMENT    . LUMBAR DISC SURGERY  X 2  . POLYPECTOMY  06/24/2017   Procedure: POLYPECTOMY;  Surgeon: Daneil Dolin, MD;  [facility];  Service: Endoscopy;;  ascending  . POSTERIOR LUMBAR FUSION  ? date 1st fusion ; 07/2006   previous L4-5 Ray  threaded fusion cage; Exploration of L4-5 fusion & PLIF 07/22/2006/notes 07/22/2006  . TONSILLECTOMY    . TOTAL KNEE ARTHROPLASTY Right 05/24/2013   Procedure: TOTAL KNEE ARTHROPLASTY;  Surgeon: [name], MD;  Location: [place] [facility];  Service: Orthopedics;  Laterality: Right;  . TOTAL KNEE ARTHROPLASTY Left   . VAGINAL HYSTERECTOMY      There were no vitals filed for this visit.  Subjective Assessment - 09/20/18 1302    Subjective  Pt arrived 10 min late    Currently in Pain?  No/denies            ADULT SLP TREATMENT - 09/20/18 1148      General Information   Behavior/Cognition  Alert;Cooperative;Pleasant mood      Treatment Provided   Treatment provided  Cognitive-Linquistic      Pain Assessment   Pain Assessment  No/denies pain      Cognitive-Linquistic Treatment   Treatment focused on  Dysarthria    Skilled Treatment  Pt arrived 10 min late. SLP educated re: proposed therapy goals and environmental strategies to improve intelligibility in conversation with her husband. SLP initiated training in HEP for dysarthria/voice. Pt with approximately 40% success coordinating abominal breathing at rest, so SLP provided instructions for pt to practice at home in supine position. Mod-max A for pitch glides and high pitched sentences.      Assessment / Recommendations / Plan   Plan  Continue with current plan of care      Progression Toward Goals   Progression toward goals  Progressing toward goals       SLP Education - 09/20/18 1302    Education Details  HEP for dysarthria, environmental modifications for speech intelligibility    Person(s) Educated  Patient    Methods  Explanation;Handout    Comprehension  Verbalized understanding;Need further instruction;Verbal cues required         SLP Long Term Goals - 09/20/18 1305      SLP LONG TERM GOAL #1   Title  Pt will complete HEP for dysarthria/voice with mod I x 3 sessions    Time  4    Period  Weeks    Status  On-going       SLP LONG TERM GOAL #2   Title  Pt will use abdominal breathing in 8 min simple conversation >80% of the time x 2 visits    Time  4    Period  Weeks    Status  On-going      SLP LONG TERM GOAL #3   Title  Pt will achieve modal pitch within normal range (165-255Hz ) in 5 minutes simple conversation x 2 visits.    Time  4    Period  Weeks  Status  On-going      SLP LONG TERM GOAL #4   Title  Pt will report fewer requests from husband to repeat herself with compensations and environmental adjustments (closer proximity, lighting, background noise, etc) x 2 sessions.    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 Pt's vocal quality is hoarse and rough, her vocal intensity is reduced and her pitch is below normal for gender. She reports she feels embarrassed by her lower pitched voice since her stroke. Initiated training in abdominal breathing and pitch glides today. I recommend brief course of ST to train pt in abdominal breathing, pitch control techniques, and environmental modifications to improve her communication at home and in the community.    Speech Therapy Frequency  2x / week    Duration  4 weeks    Treatment/Interventions  Compensatory strategies;Functional tasks;Cueing hierarchy;Patient/family education;Environmental controls;Compensatory techniques;SLP instruction and feedback    Potential to Achieve Goals  Good    Consulted and Agree with Plan of Care  Patient       Patient will benefit from skilled therapeutic intervention in order to improve the following deficits and impairments:   1. Dysarthria and anarthria       Problem List Patient Active Problem List   Diagnosis Date Noted  . Physical deconditioning 12/19/2017  . Chronic kidney disease (CKD), stage III (moderate) (HCC)   . Hypoalbuminemia due to protein-calorie  malnutrition ([place])   . Intractable migraine without status migrainosus   . Reactive depression   . Hemiparesis affecting right side as late effect of stroke ([place])   . Slow transit constipation   . PAF (paroxysmal atrial fibrillation) ([place])   . Dysphasia, post-stroke   . COPD (chronic obstructive pulmonary disease) ([place]) 10/29/2017  . Chronic diastolic CHF (congestive heart failure) ([place]) 10/29/2017  . Hypothyroidism 10/29/2017  . Essential hypertension 10/29/2017  . GERD (gastroesophageal reflux disease) 10/29/2017  . Anxiety 10/29/2017  . 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 Hyperlipidemia 04/20/2013   Deneise Lever, [place], [place] Speech-Language Pathologist  Aliene Altes 09/20/2018, 1:08 PM  [facility] [address] [place], [place], [address] Phone: [phone]   Fax:  [phone]   Name: Holly Flores MRN: [id number] Date of Birth: 10-Jul-1945

## 2018-09-20 NOTE — Therapy (Signed)
[facility] [address] [place], [place], [address] Phone: [phone]   Fax:  [phone]  Occupational Therapy Treatment  Patient Details  Name: Holly Flores MRN: [id number] Date of Birth: October 17, 1945 Referring Provider (OT): Dr. Octavio Graves   Encounter Date: 09/20/2018  OT End of Session - 09/20/18 1304    Visit Number  2    Number of Visits  16    Date for OT Re-Evaluation  11/23/18   extended d/t patient not being seen for almost a month after initial eval   Authorization Type  M[employer] primary, [employer] secondary    Authorization - Visit Number  2    Authorization - Number of Visits  10    OT Start Time  1150    OT Stop Time  1245    OT Time Calculation (min)  55 min    Activity Tolerance  Patient tolerated treatment well    Behavior During Therapy  [facility] for tasks assessed/performed       Past Medical History:  Diagnosis Date  . Anxiety   . Arthritis    "all over" (12/20/2017)  . Central retinal vein occlusion of left eye 01/22/2015  . CHF (congestive heart failure) ([place])   . Chronic bronchitis ([place])    "get it most years" (12/20/2017)  . Chronic lower back pain   . CKD (chronic kidney disease), stage II   . COPD (chronic obstructive pulmonary disease) ([place])   . CVA (cerebral vascular accident) ([place]) 12/30/2014   OCULAR  LEFT  EYE   . Depression   . Fibromyalgia   . GERD (gastroesophageal reflux disease)   . Goiter   . History of colonic polyps 05/14/2014  . Hyperlipidemia   . Hypertension   . Hypothyroidism   . Kidney cysts    left side  . 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 Migraine    "couple times/wk; been having them for a long time" (12/20/2017)  . Myocardial infarction [facility])    "one dr said I did; one said I didn't;  don't remember when it was" (12/20/2017)  . 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 On home oxygen therapy    "3L; sleep w/it q night"  (12/20/2017)  . Pneumonia    "several times" (12/20/2017)  . Shortness of breath   . Sleep apnea    "stopped mask when I got on the oxygen" (12/20/2017)  . Stroke [facility])     Past Surgical History:  Procedure Laterality Date  . ANTERIOR CERVICAL DECOMP/DISCECTOMY FUSION    . BACK SURGERY    . BREAST BIOPSY     left-non cancerous  . CARDIAC CATHETERIZATION    . CARPAL TUNNEL RELEASE Left   . CATARACT EXTRACTION W/PHACO  09/21/2011   Procedure: CATARACT EXTRACTION PHACO AND INTRAOCULAR LENS PLACEMENT (IOC);  Surgeon: Williams Che, MD;  Location: [facility];  Service: Ophthalmology;  Laterality: Left;  CDE:9.78  . CATARACT EXTRACTION W/PHACO Right 10/15/2014   Procedure: CATARACT EXTRACTION PHACO AND INTRAOCULAR LENS PLACEMENT ([place]);  Surgeon: Williams Che, MD;  Location: [facility];  Service: Ophthalmology;  Laterality: Right;  CDE:4.19  . [place]; 1972  . CHOLECYSTECTOMY OPEN    . COLONOSCOPY  2006   RMR: 1. Internal hemorroids, otherwise normal rectum. 2. Pedunculated polyp at 35 cm. reomved with snare. The remainder of teh colonic mucosa appeared normal.   . COLONOSCOPY  2011   Dr.  Rourk: multiple ascending colon polyps and rectal polyp, adenomatous  . COLONOSCOPY N/A 05/31/2014   Procedure: COLONOSCOPY;  Surgeon: Daneil Dolin, MD;  [facility]: [facility];  Service: Endoscopy;  Laterality: N/A;  130  . COLONOSCOPY WITH PROPOFOL N/A 06/24/2017   Procedure: COLONOSCOPY WITH PROPOFOL;  Surgeon: Daneil Dolin, MD;  Location: [facility];  Service: Endoscopy;  Laterality: N/A;  2:15pm  . ESOPHAGOGASTRODUODENOSCOPY  2006   Dr. Gala Romney: Subtle Schatzkis ring, otherwise normal upper GI tract, aside from a small pyloric channell erosion, status post dilation as described above.   Marland Kitchen FLEXIBLE SIGMOIDOSCOPY N/A 11/11/2017   Procedure: FLEXIBLE SIGMOIDOSCOPY;  Surgeon: Otis Brace, MD;  Location: [facility];  Service: Gastroenterology;  Laterality: N/A;  . JOINT REPLACEMENT     . LUMBAR DISC SURGERY  X 2  . POLYPECTOMY  06/24/2017   Procedure: POLYPECTOMY;  Surgeon: Daneil Dolin, MD;  [facility]: [facility];  Service: Endoscopy;;  ascending  . POSTERIOR LUMBAR FUSION  ? date 1st fusion ; 07/2006   previous L4-5 Ray threaded fusion cage; Exploration of L4-5 fusion & PLIF 07/22/2006/notes 07/22/2006  . TONSILLECTOMY    . TOTAL KNEE ARTHROPLASTY Right 05/24/2013   Procedure: TOTAL KNEE ARTHROPLASTY;  Surgeon: [name], MD;  Location: [facility];  Service: Orthopedics;  Laterality: Right;  . TOTAL KNEE ARTHROPLASTY Left   . VAGINAL HYSTERECTOMY      There were no vitals filed for this visit.  Subjective Assessment - 09/20/18 1154    Subjective   I practiced with a rocker knife in the hospital    Pertinent History  CVA 10/2017. PMH: CHF, COPD, CKD (Stage II), fibromyalgia, depression. (Husband, who is primary caregiver, also has cancer)    Limitations  obesity, needs DME to transfer (sliding board or stand assist)    Currently in Pain?  No/denies      Pt shown recommended A/E needs to increase independence with ADLS including: rocker knife, LH sponge (for washing back/feet), LH brush, and LH shoe horn, and options for one handed tying shoes including: velcro converters, spyrolaces, and shoe buttons. Pt practiced use of rocker knife and shoe buttons. Pt can doff Rt shoe, but required assist to don Rt shoe. Pt then could rehook lace around shoe button w/ mod difficulty.   Table slides for RUE ROM and Lt shoulder ROM. Pt then performed level transfer to Lt side from w/c to mat using sliding board and mod assist.   Pt's daughter arrived at end of session and discussed A/E recommendations. Discussed pursuit of Steady Assist Lift from [place]. Also encouraged her to discuss getting PCP to make orthopedic referral to address pt's Lt shoulder.                      OT Education - 09/20/18 1302    Education Details  A/E needs, encouraged pt to see orthopedic MD  re: Lt shoulder, further pursuit of Steady (stand assist lift)    Person(s) Educated  Patient;Child(ren)   discussed w/ pt's daughter at end of session recommendations   Methods  Explanation;Handout    Comprehension  Verbalized understanding       OT Short Term Goals - 08/23/18 1614      OT SHORT TERM GOAL #1   Title  Independent with HEP for RUE (Caregiver assisted and self ROM as able)    Time  4    Period  Weeks    Status  New  OT SHORT TERM GOAL #2   Title  Pt/family to verbalize understanding with A/E (rocker knife, adapted shoes, extended brush, LH sponge, etc) to increase independence with BADLS    Time  4    Period  Weeks    Status  New      OT SHORT TERM GOAL #3   Title  Pt/family to verbalize understanding with use of stand assist (Steady) with toileting and transfers to decrease burden of care on caregiver    Time  4    Period  Weeks    Status  New      OT SHORT TERM GOAL #4   Title  Pt to demo sliding board transfers consistently w/ mod assist or less for level transfer from w/c to/from bed    Time  4    Period  Weeks    Status  New        OT Long Term Goals - 08/23/18 1618      OT LONG TERM GOAL #1   Title  Pt to demo independence with HEP for Lt shoulder (once pt seen by ortho MD)    Time  8    Period  Weeks    Status  New      OT LONG TERM GOAL #2   Title  Pt to be min assist for UE dressing and bathing    Time  8    Period  Weeks    Status  New      OT LONG TERM GOAL #3   Title  Pt to cut food and brush hair w/ A/E independently    Time  8    Period  Weeks    Status  New      OT LONG TERM GOAL #4   Title  Pt to perform toilet transfer w/ mod assist using correct DME prn    Time  8    Period  Weeks    Status  New      OT LONG TERM GOAL #5   Title  Pt to perform LE dressing at max assist, LE bathing at min assist w/ use of LH sponge    Time  8    Period  Weeks    Status  New            Plan - 09/20/18 1308    Clinical  Impression Statement  Pt very limited due to severity of deficits including poor posture, flaccid Rt hemiplegia, poor sensation, LUE shoulder limitations.    Occupational performance deficits (Please refer to evaluation for details):  ADL's;IADL's;Rest and Sleep    Body Structure / Function / Physical Skills  ADL;ROM;Vision;IADL;Edema;Body mechanics;Endurance;Mobility;Strength;Coordination;Obesity;UE functional use;Pain;Decreased knowledge of use of DME    Rehab Potential  Fair    Comorbidities impacting occupational performance description:  obesity, CHF, COPD, Lt shoulder limitations    OT Frequency  2x / week    OT Duration  8 weeks    OT Treatment/Interventions  Self-care/ADL training;Therapeutic exercise;Functional Mobility Training;Neuromuscular education;Splinting;Manual Therapy;Therapeutic activities;DME and/or AE instruction;Aquatic Therapy;Passive range of motion;Patient/family education;Visual/perceptual remediation/compensation    Plan  review A/E needs and demo as needed, follow up w/ getting Steady Assist (if [facility] will pay, if LMN necessary, etc), continue self ROM as able and transfers using sliding board, initiate HEP    Consulted and Agree with Plan of Care  Patient       Patient will benefit from skilled therapeutic intervention in order to improve the following deficits  and impairments:   Body Structure / Function / Physical Skills: ADL, ROM, Vision, IADL, Edema, Body mechanics, Endurance, Mobility, Strength, Coordination, Obesity, UE functional use, Pain, Decreased knowledge of use of DME       Visit Diagnosis: 1. 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 Muscle weakness (generalized)   3. Abnormal posture   4. Stiffness of left shoulder, not elsewhere classified       Problem List Patient Active Problem List   Diagnosis Date Noted  . Physical deconditioning 12/19/2017  . Chronic kidney disease (CKD), stage III  (moderate) (HCC)   . Hypoalbuminemia due to protein-calorie malnutrition ([place])   . Intractable migraine without status migrainosus   . Reactive depression   . Hemiparesis affecting right side as late effect of stroke ([place])   . Slow transit constipation   . PAF (paroxysmal atrial fibrillation) ([place])   . Dysphasia, post-stroke   . COPD (chronic obstructive pulmonary disease) ([place]) 10/29/2017  . Chronic diastolic CHF (congestive heart failure) ([place]) 10/29/2017  . Hypothyroidism 10/29/2017  . Essential hypertension 10/29/2017  . GERD (gastroesophageal reflux disease) 10/29/2017  . Anxiety 10/29/2017  . 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 Hyperlipidemia 04/20/2013    Carey Bullocks, OTR/L 09/20/2018, 1:46 PM  [facility] [address] [place], [place], [address] Phone: [phone]   Fax:  [phone]  Name: Holly Flores MRN: [id number] Date of Birth: 12-15-1945

## 2018-09-22 ENCOUNTER — Ambulatory Visit: Payer: Medicare Other | Admitting: Occupational Therapy

## 2018-09-22 ENCOUNTER — Ambulatory Visit: Payer: Medicare Other | Admitting: Physical Therapy

## 2018-09-22 ENCOUNTER — Ambulatory Visit: Payer: Medicare Other | Admitting: Speech Pathology

## 2018-09-27 ENCOUNTER — Ambulatory Visit: Payer: Medicare Other | Admitting: Physical Therapy

## 2018-09-27 ENCOUNTER — Ambulatory Visit: Payer: Medicare Other | Admitting: Speech Pathology

## 2018-09-27 ENCOUNTER — Encounter: Payer: Self-pay | Admitting: Physical Therapy

## 2018-09-27 ENCOUNTER — Ambulatory Visit: Payer: Medicare Other | Admitting: Occupational Therapy

## 2018-09-27 ENCOUNTER — Other Ambulatory Visit: Payer: Self-pay

## 2018-09-27 VITALS — BP 144/88

## 2018-09-27 DIAGNOSIS — R471 Dysarthria and anarthria: Secondary | ICD-10-CM

## 2018-09-27 DIAGNOSIS — R2689 Other abnormalities of gait and mobility: Secondary | ICD-10-CM

## 2018-09-27 DIAGNOSIS — I69351 Hemiplegia and hemiparesis following cerebral infarction affecting right dominant side: Secondary | ICD-10-CM

## 2018-09-27 DIAGNOSIS — R293 Abnormal posture: Secondary | ICD-10-CM

## 2018-09-27 DIAGNOSIS — R29818 Other symptoms and signs involving the nervous system: Secondary | ICD-10-CM

## 2018-09-27 DIAGNOSIS — M6281 Muscle weakness (generalized): Secondary | ICD-10-CM

## 2018-09-27 NOTE — Therapy (Signed)
[facility] [address] [place], [place], [address] Phone: [phone]   Fax:  [phone]  Physical Therapy Treatment  Patient Details  Name: Holly Flores MRN: [id number] Date of Birth: [birth year] Referring Provider (PT): Octavio Graves, DO   Encounter Date: 09/27/2018   CLINIC OPERATION CHANGES: Outpatient [facility] Rehab is open at lower capacity following universal masking, social distancing, and patient screening.  The patient's COVID risk of complications score is 6.   PT End of Session - 09/27/18 1341    Visit Number  17    Number of Visits  26    Date for PT Re-Evaluation  10/18/18    Authorization Type  Progress note every 10th visit; KX modifier at 15th visit.    PT Start Time  1230    PT Stop Time  1314    PT Time Calculation (min)  44 min    Activity Tolerance  Treatment limited secondary to medical complications (Comment)   elevated BP and earlier report of HA; treatment cut short   Behavior During Therapy  [facility] for tasks assessed/performed       Past Medical History:  Diagnosis Date  . Anxiety   . Arthritis    "all over" (12/20/2017)  . Central retinal vein occlusion of left eye 01/22/2015  . CHF (congestive heart failure) ([place])   . Chronic bronchitis ([place])    "get it most years" (12/20/2017)  . Chronic lower back pain   . CKD (chronic kidney disease), stage II   . COPD (chronic obstructive pulmonary disease) ([place])   . CVA (cerebral vascular accident) ([place]) 12/30/2014   OCULAR  LEFT  EYE   . Depression   . Fibromyalgia   . GERD (gastroesophageal reflux disease)   . Goiter   . History of colonic polyps 05/14/2014  . Hyperlipidemia   . Hypertension   . Hypothyroidism   . Kidney cysts    left side  . 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 Migraine    "couple times/wk; been having them for a long time" (12/20/2017)  . Myocardial infarction [facility])    "one dr said I did; one said I didn't;  don't remember when it was" (12/20/2017)  . 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 On home oxygen therapy    "3L; sleep w/it q night" (12/20/2017)  . Pneumonia    "several times" (12/20/2017)  . Shortness of breath   . Sleep apnea    "stopped mask when I got on the oxygen" (12/20/2017)  . Stroke [facility])     Past Surgical History:  Procedure Laterality Date  . ANTERIOR CERVICAL DECOMP/DISCECTOMY FUSION    . BACK SURGERY    . BREAST BIOPSY     left-non cancerous  . CARDIAC CATHETERIZATION    . CARPAL TUNNEL RELEASE Left   . CATARACT EXTRACTION W/PHACO  09/21/2011   Procedure: CATARACT EXTRACTION PHACO AND INTRAOCULAR LENS PLACEMENT (IOC);  Surgeon: Williams Che, MD;  Location: [facility];  Service: Ophthalmology;  Laterality: Left;  CDE:9.78  . CATARACT EXTRACTION W/PHACO Right 10/15/2014   Procedure: CATARACT EXTRACTION PHACO AND INTRAOCULAR LENS PLACEMENT ([place]);  Surgeon: Williams Che, MD;  Location: [facility];  Service: Ophthalmology;  Laterality: Right;  CDE:4.19  . [place]; 1972  . CHOLECYSTECTOMY OPEN    . COLONOSCOPY  2006   RMR: 1. Internal hemorroids, otherwise normal rectum. 2. Pedunculated polyp at 35 cm. reomved with  snare. The remainder of teh colonic mucosa appeared normal.   . COLONOSCOPY  2011   Dr. Gala Romney: multiple ascending colon polyps and rectal polyp, adenomatous  . COLONOSCOPY N/A 05/31/2014   Procedure: COLONOSCOPY;  Surgeon: Daneil Dolin, MD;  [facility];  Service: Endoscopy;  Laterality: N/A;  130  . COLONOSCOPY WITH PROPOFOL N/A 06/24/2017   Procedure: COLONOSCOPY WITH PROPOFOL;  Surgeon: Daneil Dolin, MD;  [facility];  Service: Endoscopy;  Laterality: N/A;  2:15pm  . ESOPHAGOGASTRODUODENOSCOPY  2006   Dr. Gala Romney: Subtle Schatzkis ring, otherwise normal upper GI tract, aside from a small pyloric channell erosion, status post dilation as described above.   Marland Kitchen FLEXIBLE SIGMOIDOSCOPY N/A  11/11/2017   Procedure: FLEXIBLE SIGMOIDOSCOPY;  Surgeon: Otis Brace, MD;  Location: [facility];  Service: Gastroenterology;  Laterality: N/A;  . JOINT REPLACEMENT    . LUMBAR DISC SURGERY  X 2  . POLYPECTOMY  06/24/2017   Procedure: POLYPECTOMY;  Surgeon: Daneil Dolin, MD;  [facility];  Service: Endoscopy;;  ascending  . POSTERIOR LUMBAR FUSION  ? date 1st fusion ; 07/2006   previous L4-5 Ray threaded fusion cage; Exploration of L4-5 fusion & PLIF 07/22/2006/notes 07/22/2006  . TONSILLECTOMY    . TOTAL KNEE ARTHROPLASTY Right 05/24/2013   Procedure: TOTAL KNEE ARTHROPLASTY;  Surgeon: [name], MD;  Location: [facility];  Service: Orthopedics;  Laterality: Right;  . TOTAL KNEE ARTHROPLASTY Left   . VAGINAL HYSTERECTOMY      Vitals:   09/27/18 1230  BP: (!) 144/88    Subjective Assessment - 09/27/18 1230    Subjective  Her husband is in hospital due to his pancreatic cancer.    Pertinent History  left sided CVA 10/29/17, Chronic CHF, AFib, COPD, Chronic Kidney Disease stage III, hx of bil TKA, Lumbar surgery    Patient Stated Goals  move around better in the home and improve function    Currently in Pain?  Yes    Pain Score  7    at rest 2-3/10 with movement 7/10   Pain Location  Shoulder    Pain Orientation  Left    Pain Descriptors / Indicators  Stabbing    Pain Type  Acute pain    Pain Onset  More than a month ago    Pain Frequency  Constant    Aggravating Factors   moving left arm    Pain Relieving Factors  resting with arm supported decreases pain to 2-3/10                       [facility] Adult PT Treatment/Exercise - 09/27/18 1230      Bed Mobility   Bed Mobility  Rolling Right;Rolling Left;Right Sidelying to Sit    Rolling Right  Moderate Assistance - Patient 50-74%    Rolling Right Details (indicate cue type and reason)  tactile & verbal cues on technique with Rt hemiplegia    Rolling Left  Maximal Assistance - Patient 25-49%    Rolling  Left Details (indicate cue type and reason)  tactile & verbal cues on technique with Rt hemiplegia    Right Sidelying to Sit  Maximal Assistance - Patient 25-49%    Right Sidelying to Sit Details (indicate cue type and reason)  tactile & verbal cues on technique with Rt hemiplegia      Transfers   Transfers  Lateral/Scoot Transfers    Lateral/Scoot Transfers  3: Mod  assist;With slide board   squat pivot moving pelvis ~6-10" per move   Lateral/Scoot Transfer Details (indicate cue type and reason)  tactile & verbal cues to facilitate clearance of gluteals, trunk position to facilitate movements.      Lumbar Exercises: Seated   Other Seated Lumbar Exercises  5 reps ea movement both directions: sidebend sit <> elbow;  rotation to look behind;  reclined on flipped chair - modified sit-up, rotation sit-up with minA; back extension reaching to floor to upright without UE assist.   Tactile and verbal cues for motion      Knee/Hip Exercises: Seated   Heel Slides  AAROM;Right;10 reps    Heel Slides Limitations  foot on pillow case to enable no resistance from floor.  Knee extension / flexion    Abduction/Adduction   AAROM;Right;10 reps    Abd/Adduction Limitations  foot on pillow case to enable no resistance from floor.  Knee extension / flexion             PT Education - 09/27/18 1310    Education Details  [email or web address] website that enables her to buy 2 different sizes of same shoe,  PT reviewed basics of phamplets that OT issued    Person(s) Educated  Patient;Child(ren)    Methods  Explanation;Handout    Comprehension  Verbalized understanding       PT Short Term Goals - 09/27/18 1341      PT SHORT TERM GOAL #1   Title  Pt and family will be independent with supine, seated and standing HEP    Time  4    Status  New    Target Date  09/18/18      PT SHORT TERM GOAL #2   Title  Pt will perform slideboard transfers to left and right on level surfaces with min A of family    Baseline   Mod to R, Min to L    Time  4    Period  Weeks    Status  New    Target Date  09/18/18      PT SHORT TERM GOAL #3   Title  Pt will perform bed mobility on flat mat with mod A (supine <> sit and rolling)    Baseline  MET 09/27/2018    Time  4    Period  Weeks    Status  Achieved    Target Date  09/18/18      PT SHORT TERM GOAL #4   Title  Pt will perform sit > stand pushing from wheelchair with mod A on R side    Time  4    Period  Weeks    Status  New    Target Date  09/18/18      PT SHORT TERM GOAL #5   Title  Pt will ambulate x 25' with RW with hand orthosis, trial AFO and mod-max A of one therapist    Time  4    Period  Weeks    Status  Revised    Target Date  09/18/18        PT Long Term Goals - 08/19/18 0916      PT LONG TERM GOAL #1   Title  Patient and family will be independent with final HEP recommendations    Time  8    Period  Weeks    Status  Revised    Target Date  10/18/18      PT LONG TERM GOAL #2  Title  Patient will perform sit <> stand from wheelchair with min A and transfer stand pivot with RW and AFO with Mod A of one person    Time  8    Period  Weeks    Status  Revised    Target Date  10/18/18      PT LONG TERM GOAL #3   Title  Pt will perform bed mobility on flat mat with min A    Time  8    Period  Weeks    Status  Revised    Target Date  10/18/18      PT LONG TERM GOAL #4   Title  Family will demonstrate independent ability to don appropriate AFO with R shoe    Time  8    Period  Weeks    Status  New    Target Date  10/18/18      PT LONG TERM GOAL #5   Title  Patient will ambulate 40 feet or greater with min A, AFO, and LRAD to safely navigate around home.    Time  8    Period  Weeks    Status  Revised    Target Date  10/18/18            Plan - 09/27/18 1342    Clinical Impression Statement  Today's session focused on bed mobility, trunk control & facilitating RLE movements in sitting.  PT focused transfer on  clearance of buttocks using sliding board but 6 squat pivots    Personal Factors and Comorbidities  Age;Comorbidity 3+;Time since onset of injury/illness/exacerbation    Examination-Activity Limitations  Lift;Dressing;Self Feeding;Transfers;Toileting;Stand;Locomotion Level;Reach Overhead;Bed Mobility    Rehab Potential  Fair    PT Frequency  2x / week    PT Duration  8 weeks    PT Treatment/Interventions  ADLs/Self Care Home Management;Electrical Stimulation;Gait training;Stair training;Functional mobility training;Balance training;Therapeutic exercise;Therapeutic activities;Neuromuscular re-education;Wheelchair mobility training;Manual techniques;Passive range of motion;Patient/family education;DME Instruction;Energy conservation;Orthotic Fit/Training    PT Next Visit Plan  MONITOR BP EVERY SESSION WITH MANUAL CUFF ON LUE; Check remaining STGs.  Seated mirror therapy for LE.  Stedy transfers and sit <> stand on Stedy without UE support.  Seated on large rockerboard or inverted BOSU performing lateral tilts or anterior/posterior for sit <> stand.  When she receives her AFO begin using for transfers, standing and gait.  Work on decreasing assistance needed to perform level slideboard transfers to L and R.  Work on bed mobility on flat bed.  Add to HEP: WB exercises for mm activation    Consulted and Agree with Plan of Care  Patient;Family member/caregiver    Family Member Consulted  daughter       Patient will benefit from skilled therapeutic intervention in order to improve the following deficits and impairments:  Pain, Postural dysfunction, Impaired tone, Decreased mobility, Decreased activity tolerance, Decreased range of motion, Decreased strength, Impaired UE functional use, Difficulty walking, Decreased balance, Abnormal gait, Decreased endurance  Visit Diagnosis: 1. Muscle weakness (generalized)   2. Abnormal posture   3. Other symptoms and signs involving the nervous system   4. Other  abnormalities of gait and mobility        Problem List Patient Active Problem List   Diagnosis Date Noted  . Physical deconditioning 12/19/2017  . Chronic kidney disease (CKD), stage III (moderate) (HCC)   . Hypoalbuminemia due to protein-calorie malnutrition ([place])   . Intractable migraine without status migrainosus   . Reactive depression   .  Hemiparesis affecting right side as late effect of stroke ([place])   . Slow transit constipation   . PAF (paroxysmal atrial fibrillation) ([place])   . Dysphasia, post-stroke   . COPD (chronic obstructive pulmonary disease) ([place]) 10/29/2017  . Chronic diastolic CHF (congestive heart failure) ([place]) 10/29/2017  . Hypothyroidism 10/29/2017  . Essential hypertension 10/29/2017  . GERD (gastroesophageal reflux disease) 10/29/2017  . Anxiety 10/29/2017  . 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 Hyperlipidemia 04/20/2013    Via Rosado PT, DPT 09/27/2018, 1:45 PM  [facility] [address] [place], [place], [address] Phone: [phone]   Fax:  [phone]  Name: Holly Flores MRN: [id number] Date of Birth: August 05, 1945

## 2018-09-27 NOTE — Therapy (Signed)
[facility] [address] [place], [place], [address] Phone: [phone]   Fax:  [phone]  Occupational Therapy Treatment  Patient Details  Name: Holly Flores MRN: [id number] Date of Birth: August 08, 1945 Referring Provider (OT): Dr. Octavio Graves   Encounter Date: 09/27/2018  OT End of Session - 09/27/18 1258    Visit Number  3    Number of Visits  16    Date for OT Re-Evaluation  11/23/18    Authorization Type  M[employer] primary, [employer] secondary    Authorization - Visit Number  3    Authorization - Number of Visits  10    OT Start Time  1148    OT Stop Time  1235    OT Time Calculation (min)  47 min    Activity Tolerance  Patient tolerated treatment well    Behavior During Therapy  [facility] for tasks assessed/performed       Past Medical History:  Diagnosis Date  . Anxiety   . Arthritis    "all over" (12/20/2017)  . Central retinal vein occlusion of left eye 01/22/2015  . CHF (congestive heart failure) ([place])   . Chronic bronchitis ([place])    "get it most years" (12/20/2017)  . Chronic lower back pain   . CKD (chronic kidney disease), stage II   . COPD (chronic obstructive pulmonary disease) ([place])   . CVA (cerebral vascular accident) ([place]) 12/30/2014   OCULAR  LEFT  EYE   . Depression   . Fibromyalgia   . GERD (gastroesophageal reflux disease)   . Goiter   . History of colonic polyps 05/14/2014  . Hyperlipidemia   . Hypertension   . Hypothyroidism   . Kidney cysts    left side  . 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 Migraine    "couple times/wk; been having them for a long time" (12/20/2017)  . Myocardial infarction [facility])    "one dr said I did; one said I didn't;  don't remember when it was" (12/20/2017)  . 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 On home oxygen therapy    "3L; sleep w/it q night" (12/20/2017)  . Pneumonia    "several times" (12/20/2017)  . Shortness of  breath   . Sleep apnea    "stopped mask when I got on the oxygen" (12/20/2017)  . Stroke [facility])     Past Surgical History:  Procedure Laterality Date  . ANTERIOR CERVICAL DECOMP/DISCECTOMY FUSION    . BACK SURGERY    . BREAST BIOPSY     left-non cancerous  . CARDIAC CATHETERIZATION    . CARPAL TUNNEL RELEASE Left   . CATARACT EXTRACTION W/PHACO  09/21/2011   Procedure: CATARACT EXTRACTION PHACO AND INTRAOCULAR LENS PLACEMENT (IOC);  Surgeon: Williams Che, MD;  Location: [facility];  Service: Ophthalmology;  Laterality: Left;  CDE:9.78  . CATARACT EXTRACTION W/PHACO Right 10/15/2014   Procedure: CATARACT EXTRACTION PHACO AND INTRAOCULAR LENS PLACEMENT ([place]);  Surgeon: Williams Che, MD;  Location: [facility];  Service: Ophthalmology;  Laterality: Right;  CDE:4.19  . [place]; 1972  . CHOLECYSTECTOMY OPEN    . COLONOSCOPY  2006   RMR: 1. Internal hemorroids, otherwise normal rectum. 2. Pedunculated polyp at 35 cm. reomved with snare. The remainder of teh colonic mucosa appeared normal.   . COLONOSCOPY  2011   Dr. Gala Romney: multiple ascending colon polyps and rectal polyp, adenomatous  . COLONOSCOPY N/A 05/31/2014  Procedure: COLONOSCOPY;  Surgeon: Daneil Dolin, MD;  Location: [facility];  Service: Endoscopy;  Laterality: N/A;  130  . COLONOSCOPY WITH PROPOFOL N/A 06/24/2017   Procedure: COLONOSCOPY WITH PROPOFOL;  Surgeon: Daneil Dolin, MD;  Location: [facility];  Service: Endoscopy;  Laterality: N/A;  2:15pm  . ESOPHAGOGASTRODUODENOSCOPY  2006   Dr. Gala Romney: Subtle Schatzkis ring, otherwise normal upper GI tract, aside from a small pyloric channell erosion, status post dilation as described above.   Marland Kitchen FLEXIBLE SIGMOIDOSCOPY N/A 11/11/2017   Procedure: FLEXIBLE SIGMOIDOSCOPY;  Surgeon: Otis Brace, MD;  Location: [facility];  Service: Gastroenterology;  Laterality: N/A;  . JOINT REPLACEMENT    . LUMBAR DISC SURGERY  X 2  . POLYPECTOMY  06/24/2017   Procedure:  POLYPECTOMY;  Surgeon: Daneil Dolin, MD;  Location: [facility];  Service: Endoscopy;;  ascending  . POSTERIOR LUMBAR FUSION  ? date 1st fusion ; 07/2006   previous L4-5 Ray threaded fusion cage; Exploration of L4-5 fusion & PLIF 07/22/2006/notes 07/22/2006  . TONSILLECTOMY    . TOTAL KNEE ARTHROPLASTY Right 05/24/2013   Procedure: TOTAL KNEE ARTHROPLASTY;  Surgeon: [name], MD;  Location: [facility];  Service: Orthopedics;  Laterality: Right;  . TOTAL KNEE ARTHROPLASTY Left   . VAGINAL HYSTERECTOMY      There were no vitals filed for this visit.  Subjective Assessment - 09/27/18 1159    Pertinent History  CVA 10/2017. PMH: CHF, COPD, CKD (Stage II), fibromyalgia, depression. (Husband, who is primary caregiver, also has cancer)    Limitations  montior BP, obesity, needs DME to transfer (sliding board or stand assist)    Currently in Pain?  No/denies       BP 140/80  Reviewed A/E needs. Also provided pt with information on THN, PACE, and ACE Issued HEP for Rt and Lt shoulder and reviewed. Pt returned demo with help prn and cues to perform correctly. Will need further review.  Sliding board transfer from w/c to mat with mod assist. Worked on trunk control seated w/ LUE reaching slightly outside BOS w/ near falls to Rt side when reaching to Rt side w/ LUE.  Supine: passive stretch to RUE in shoulder flexion and abduction. Followed by active sh flexion LUE w/ cues for proper positioning. Therapist then also stretched LUE in abduction and ER as able - pt noted to be extremely tight w/ ER Lt shoulder.                      OT Education - 09/27/18 1235    Education Details  HEP (Pt also given resources for [facility], PACE, and ACE, however no family present)    Person(s) Educated  Patient    Methods  Explanation;Demonstration;Handout;Verbal cues    Comprehension  Verbalized understanding;Returned demonstration;Verbal cues required;Need further instruction       OT Short Term  Goals - 08/23/18 1614      OT SHORT TERM GOAL #1   Title  Independent with HEP for RUE (Caregiver assisted and self ROM as able)    Time  4    Period  Weeks    Status  New      OT SHORT TERM GOAL #2   Title  Pt/family to verbalize understanding with A/E (rocker knife, adapted shoes, extended brush, LH sponge, etc) to increase independence with BADLS    Time  4    Period  Weeks    Status  New  OT SHORT TERM GOAL #3   Title  Pt/family to verbalize understanding with use of stand assist (Steady) with toileting and transfers to decrease burden of care on caregiver    Time  4    Period  Weeks    Status  New      OT SHORT TERM GOAL #4   Title  Pt to demo sliding board transfers consistently w/ mod assist or less for level transfer from w/c to/from bed    Time  4    Period  Weeks    Status  New        OT Long Term Goals - 08/23/18 1618      OT LONG TERM GOAL #1   Title  Pt to demo independence with HEP for Lt shoulder (once pt seen by ortho MD)    Time  8    Period  Weeks    Status  New      OT LONG TERM GOAL #2   Title  Pt to be min assist for UE dressing and bathing    Time  8    Period  Weeks    Status  New      OT LONG TERM GOAL #3   Title  Pt to cut food and brush hair w/ A/E independently    Time  8    Period  Weeks    Status  New      OT LONG TERM GOAL #4   Title  Pt to perform toilet transfer w/ mod assist using correct DME prn    Time  8    Period  Weeks    Status  New      OT LONG TERM GOAL #5   Title  Pt to perform LE dressing at max assist, LE bathing at min assist w/ use of LH sponge    Time  8    Period  Weeks    Status  New            Plan - 09/27/18 1258    Clinical Impression Statement  Pt very limited due to severity of deficits including poor posture, flaccid Rt hemiplegia, poor sensation, LUE shoulder limitations. Pt w/ no family present during O.T. session today for education.  LUE shoulder limitations with etiology unknown    Occupational performance deficits (Please refer to evaluation for details):  ADL's;IADL's;Rest and Sleep    Body Structure / Function / Physical Skills  ADL;ROM;Vision;IADL;Edema;Body mechanics;Endurance;Mobility;Strength;Coordination;Obesity;UE functional use;Pain;Decreased knowledge of use of DME    Rehab Potential  Fair    Comorbidities impacting occupational performance description:  obesity, CHF, COPD, Lt shoulder limitations    OT Frequency  2x / week    OT Duration  8 weeks    OT Treatment/Interventions  Self-care/ADL training;Therapeutic exercise;Functional Mobility Training;Neuromuscular education;Splinting;Manual Therapy;Therapeutic activities;DME and/or AE instruction;Aquatic Therapy;Passive range of motion;Patient/family education;Visual/perceptual remediation/compensation    Plan  monitor BP, review HEP, trunk balance/control, practice use of Steady Assist if time       Patient will benefit from skilled therapeutic intervention in order to improve the following deficits and impairments:   Body Structure / Function / Physical Skills: ADL, ROM, Vision, IADL, Edema, Body mechanics, Endurance, Mobility, Strength, Coordination, Obesity, UE functional use, Pain, Decreased knowledge of use of DME       Visit Diagnosis: 1. 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 Muscle weakness (generalized)   3. Abnormal posture  Problem List Patient Active Problem List   Diagnosis Date Noted  . Physical deconditioning 12/19/2017  . Chronic kidney disease (CKD), stage III (moderate) (HCC)   . Hypoalbuminemia due to protein-calorie malnutrition ([place])   . Intractable migraine without status migrainosus   . Reactive depression   . Hemiparesis affecting right side as late effect of stroke ([place])   . Slow transit constipation   . PAF (paroxysmal atrial fibrillation) ([place])   . Dysphasia, post-stroke   . COPD (chronic obstructive pulmonary  disease) ([place]) 10/29/2017  . Chronic diastolic CHF (congestive heart failure) ([place]) 10/29/2017  . Hypothyroidism 10/29/2017  . Essential hypertension 10/29/2017  . GERD (gastroesophageal reflux disease) 10/29/2017  . Anxiety 10/29/2017  . 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 Hyperlipidemia 04/20/2013    Carey Bullocks, OTR/L 09/27/2018, 1:00 PM  [facility] [address] [place], [place], [address] Phone: [phone]   Fax:  [phone]  Name: Holly Flores MRN: [id number] Date of Birth: May 21, 1945

## 2018-09-27 NOTE — Patient Instructions (Signed)
Flexion (Passive)    Sitting upright, place folded towel under forearm and slide arm forward along table with help from other arm (Lt hand over Rt wrist), bending from the waist until a stretch is felt. Hold __5__ seconds. Repeat _10___ times. Do __2__ sessions per day.  Then move arms (Lt arm helping) side to side x 10 reps, 2 sessions per day  FLEXION: Supine - Elbow Extended (Active - Assistance)    Lie on back, arms at side. With assistance, raise RIGHT arm arm up and back over head, keeping elbow straight. Complete _1__ sets of _10__ repetitions. Perform __2_ sessions per day.  SHOULDER: Flexion Unilateral    LAYING DOWN, Raise LEFT arm overhead. Keep thumb pointed up. _10__ reps per set, _2__ sets per day  External Rotation    LAYING DOWN, stretch Lt arm as shown with pillow(s) under arm to support it. Hold __10__ seconds. Repeat __5__ times. Do __2__ sessions per day.

## 2018-09-27 NOTE — Patient Instructions (Signed)
We will focus more on your breathing now instead of your pitch. Practice your sentences at home, like this:  Breathe In (don't let it out) and talk right away  Let your inhale POWER your voice  Let Holly Flores know next time if you want to discontinue speech therapy, or if you want to stop a little earlier than scheduled ( maybe 2-3 more sessions).

## 2018-09-27 NOTE — Therapy (Signed)
[facility] [address] [place], [place], [address] Phone: [phone]   Fax:  [phone]  Speech Language Pathology Treatment  Patient Details  Name: Holly Flores MRN: [id number] Date of Birth: 1946-01-14 Referring Provider (SLP): Lubertha South. Melina Copa, DO   Encounter Date: 09/27/2018  End of Session - 09/27/18 1314    Visit Number  2    Number of Visits  9    Date for SLP Re-Evaluation  10/23/18    SLP Start Time  1107    SLP Stop Time   4034    SLP Time Calculation (min)  37 min    Activity Tolerance  Patient tolerated treatment well       Past Medical History:  Diagnosis Date  . Anxiety   . Arthritis    "all over" (12/20/2017)  . Central retinal vein occlusion of left eye 01/22/2015  . CHF (congestive heart failure) ([place])   . Chronic bronchitis ([place])    "get it most years" (12/20/2017)  . Chronic lower back pain   . CKD (chronic kidney disease), stage II   . COPD (chronic obstructive pulmonary disease) ([place])   . CVA (cerebral vascular accident) ([place]) 12/30/2014   OCULAR  LEFT  EYE   . Depression   . Fibromyalgia   . GERD (gastroesophageal reflux disease)   . Goiter   . History of colonic polyps 05/14/2014  . Hyperlipidemia   . Hypertension   . Hypothyroidism   . Kidney cysts    left side  . 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 Migraine    "couple times/wk; been having them for a long time" (12/20/2017)  . Myocardial infarction [facility])    "one dr said I did; one said I didn't;  don't remember when it was" (12/20/2017)  . 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 On home oxygen therapy    "3L; sleep w/it q night" (12/20/2017)  . Pneumonia    "several times" (12/20/2017)  . Shortness of breath   . Sleep apnea    "stopped mask when I got on the oxygen" (12/20/2017)  . Stroke [facility])     Past Surgical History:  Procedure Laterality Date  . ANTERIOR CERVICAL  DECOMP/DISCECTOMY FUSION    . BACK SURGERY    . BREAST BIOPSY     left-non cancerous  . CARDIAC CATHETERIZATION    . CARPAL TUNNEL RELEASE Left   . CATARACT EXTRACTION W/PHACO  09/21/2011   Procedure: CATARACT EXTRACTION PHACO AND INTRAOCULAR LENS PLACEMENT (IOC);  Surgeon: Williams Che, MD;  Location: [facility];  Service: Ophthalmology;  Laterality: Left;  CDE:9.78  . CATARACT EXTRACTION W/PHACO Right 10/15/2014   Procedure: CATARACT EXTRACTION PHACO AND INTRAOCULAR LENS PLACEMENT ([place]);  Surgeon: Williams Che, MD;  Location: [facility];  Service: Ophthalmology;  Laterality: Right;  CDE:4.19  . [place]; 1972  . CHOLECYSTECTOMY OPEN    . COLONOSCOPY  2006   RMR: 1. Internal hemorroids, otherwise normal rectum. 2. Pedunculated polyp at 35 cm. reomved with snare. The remainder of teh colonic mucosa appeared normal.   . COLONOSCOPY  2011   Dr. Gala Romney: multiple ascending colon polyps and rectal polyp, adenomatous  . COLONOSCOPY N/A 05/31/2014   Procedure: COLONOSCOPY;  Surgeon: Daneil Dolin, MD;  [facility];  Service: Endoscopy;  Laterality: N/A;  130  . COLONOSCOPY WITH PROPOFOL N/A 06/24/2017   Procedure: COLONOSCOPY WITH PROPOFOL;  Surgeon:  Rourk, Cristopher Estimable, MD;  Location: [facility];  Service: Endoscopy;  Laterality: N/A;  2:15pm  . ESOPHAGOGASTRODUODENOSCOPY  2006   Dr. Gala Romney: Subtle Schatzkis ring, otherwise normal upper GI tract, aside from a small pyloric channell erosion, status post dilation as described above.   Marland Kitchen FLEXIBLE SIGMOIDOSCOPY N/A 11/11/2017   Procedure: FLEXIBLE SIGMOIDOSCOPY;  Surgeon: Otis Brace, MD;  Location: [facility];  Service: Gastroenterology;  Laterality: N/A;  . JOINT REPLACEMENT    . LUMBAR DISC SURGERY  X 2  . POLYPECTOMY  06/24/2017   Procedure: POLYPECTOMY;  Surgeon: Daneil Dolin, MD;  [facility];  Service: Endoscopy;;  ascending  . POSTERIOR LUMBAR FUSION  ? date 1st fusion ; 07/2006   previous L4-5 Ray  threaded fusion cage; Exploration of L4-5 fusion & PLIF 07/22/2006/notes 07/22/2006  . TONSILLECTOMY    . TOTAL KNEE ARTHROPLASTY Right 05/24/2013   Procedure: TOTAL KNEE ARTHROPLASTY;  Surgeon: [name], MD;  Location: [facility];  Service: Orthopedics;  Laterality: Right;  . TOTAL KNEE ARTHROPLASTY Left   . VAGINAL HYSTERECTOMY      There were no vitals filed for this visit.  Subjective Assessment - 09/27/18 1111    Subjective  "I practiced a little bit."    Currently in Pain?  No/denies            ADULT SLP TREATMENT - 09/27/18 1111      General Information   Behavior/Cognition  Alert;Cooperative;Pleasant mood      Treatment Provided   Treatment provided  Cognitive-Linquistic      Pain Assessment   Pain Assessment  No/denies pain      Cognitive-Linquistic Treatment   Treatment focused on  Dysarthria    Skilled Treatment  Pt arrived 7 min late. Mod-max A (verbal, demonstration, tactile cues, tandem breathing) for abdominal breathing at rest, ~60% accuracy over 2 minute period after 8 min instruction by SLP. Usual mod-max cues req' during HEP; after pitch glides and sentences pt told SLP, "My voice always sound like this," and pt clarified that low pitch is her baseline. Pt stated by "low" speech, she means she has difficulty projecting her voice. SLP educated pt that abdominal breathing is an intervention to target her breath support and vocal intensity. In consultation with pt re: goals, determined to defer goal for pt as pt feels this is normal for her. Pt states most difficulty is communicating with husband, who has hearing difficulties. She has been using a bell to get his attention and talking only when he is in the same room, face to face. She reports this has reduced communication breakdowns.      Assessment / Recommendations / Plan   Plan  Continue with current plan of care      Progression Toward Goals   Progression toward goals  Progressing toward goals            SLP Long Term Goals - 09/27/18 1132      SLP LONG TERM GOAL #1   Title  Pt will complete HEP for dysarthria/voice with mod I x 3 sessions    Time  3    Period  Weeks    Status  On-going      SLP LONG TERM GOAL #2   Title  Pt will use abdominal breathing in 8 min simple conversation >80% of the time x 2 visits    Time  3    Period  Weeks    Status  On-going  SLP LONG TERM GOAL #3   Title  Pt will achieve modal pitch within normal range (165-255Hz ) in 5 minutes simple conversation x 2 visits.    Time  3    Period  Weeks    Status  Deferred   pt reports she feels her pitch in normal range for her     SLP LONG TERM GOAL #4   Title  Pt will report fewer requests from husband to repeat herself with compensations and environmental adjustments (closer proximity, lighting, background noise, etc) x 2 sessions.    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 Pt's vocal quality is hoarse and rough, her vocal intensity is reduced and her pitch is below normal for gender, however pt reports low pitch is her baseline. Pt to discuss with her children re: whether they understand her speech; may consider decrease in frequency or d/c as pt reports improved communication with her husband using environmental strategies. I recommend brief course of ST to train pt in abdominal breathing and environmental modifications to improve her communication at home and in the community.    Speech Therapy Frequency  2x / week    Duration  4 weeks    Treatment/Interventions  Compensatory strategies;Functional tasks;Cueing hierarchy;Patient/family education;Environmental controls;Compensatory techniques;SLP instruction and feedback    Potential to Achieve Goals  Good    Consulted and Agree with Plan of Care  Patient       Patient  will benefit from skilled therapeutic intervention in order to improve the following deficits and impairments:   1. Dysarthria and anarthria       Problem List Patient Active Problem List   Diagnosis Date Noted  . Physical deconditioning 12/19/2017  . Chronic kidney disease (CKD), stage III (moderate) (HCC)   . Hypoalbuminemia due to protein-calorie malnutrition ([place])   . Intractable migraine without status migrainosus   . Reactive depression   . Hemiparesis affecting right side as late effect of stroke ([place])   . Slow transit constipation   . PAF (paroxysmal atrial fibrillation) ([place])   . Dysphasia, post-stroke   . COPD (chronic obstructive pulmonary disease) ([place]) 10/29/2017  . Chronic diastolic CHF (congestive heart failure) ([place]) 10/29/2017  . Hypothyroidism 10/29/2017  . Essential hypertension 10/29/2017  . GERD (gastroesophageal reflux disease) 10/29/2017  . Anxiety 10/29/2017  . 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 Hyperlipidemia 04/20/2013   Deneise Lever, [place], [place] 09/27/2018, 1:17 PM  [facility] [address] [place], [place], [address] Phone: [phone]   Fax:  [phone]   Name: Holly Flores MRN: [id number] Date of Birth: [birth year]

## 2018-09-29 ENCOUNTER — Ambulatory Visit: Payer: Medicare Other | Admitting: Occupational Therapy

## 2018-09-29 ENCOUNTER — Ambulatory Visit: Payer: Medicare Other | Admitting: Physical Therapy

## 2018-09-29 ENCOUNTER — Ambulatory Visit: Payer: Medicare Other | Admitting: Speech Pathology

## 2018-09-29 ENCOUNTER — Encounter: Payer: Medicare Other | Admitting: Speech Pathology

## 2018-09-29 ENCOUNTER — Other Ambulatory Visit: Payer: Self-pay

## 2018-09-29 VITALS — BP 140/100

## 2018-09-29 DIAGNOSIS — R293 Abnormal posture: Secondary | ICD-10-CM

## 2018-09-29 DIAGNOSIS — R262 Difficulty in walking, not elsewhere classified: Secondary | ICD-10-CM

## 2018-09-29 DIAGNOSIS — R29818 Other symptoms and signs involving the nervous system: Secondary | ICD-10-CM

## 2018-09-29 DIAGNOSIS — M6281 Muscle weakness (generalized): Secondary | ICD-10-CM

## 2018-09-29 DIAGNOSIS — I69351 Hemiplegia and hemiparesis following cerebral infarction affecting right dominant side: Secondary | ICD-10-CM

## 2018-09-29 DIAGNOSIS — R4182 Altered mental status, unspecified: Secondary | ICD-10-CM | POA: Diagnosis not present

## 2018-09-29 DIAGNOSIS — I6340 Cerebral infarction due to embolism of unspecified cerebral artery: Secondary | ICD-10-CM | POA: Diagnosis not present

## 2018-09-29 DIAGNOSIS — R471 Dysarthria and anarthria: Secondary | ICD-10-CM

## 2018-09-29 NOTE — Therapy (Signed)
[facility] [address] [place], [place], [address] Phone: [phone]   Fax:  [phone]  Speech Language Pathology Treatment and Discharge Summary  Patient Details  Name: Holly Flores MRN: [id number] Date of Birth: 11/08/1945 Referring Provider (SLP): Lubertha South. Melina Copa, DO   Encounter Date: 09/29/2018  End of Session - 09/29/18 1400    Visit Number  3    Number of Visits  9    Date for SLP Re-Evaluation  10/23/18    SLP Start Time  3500    SLP Stop Time   1354    SLP Time Calculation (min)  37 min    Activity Tolerance  Patient tolerated treatment well       Past Medical History:  Diagnosis Date  . Anxiety   . Arthritis    "all over" (12/20/2017)  . Central retinal vein occlusion of left eye 01/22/2015  . CHF (congestive heart failure) ([place])   . Chronic bronchitis ([place])    "get it most years" (12/20/2017)  . Chronic lower back pain   . CKD (chronic kidney disease), stage II   . COPD (chronic obstructive pulmonary disease) ([place])   . CVA (cerebral vascular accident) ([place]) 12/30/2014   OCULAR  LEFT  EYE   . Depression   . Fibromyalgia   . GERD (gastroesophageal reflux disease)   . Goiter   . History of colonic polyps 05/14/2014  . Hyperlipidemia   . Hypertension   . Hypothyroidism   . Kidney cysts    left side  . 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 Migraine    "couple times/wk; been having them for a long time" (12/20/2017)  . Myocardial infarction [facility])    "one dr said I did; one said I didn't;  don't remember when it was" (12/20/2017)  . 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 On home oxygen therapy    "3L; sleep w/it q night" (12/20/2017)  . Pneumonia    "several times" (12/20/2017)  . Shortness of breath   . Sleep apnea    "stopped mask when I got on the oxygen" (12/20/2017)  . Stroke [facility])     Past Surgical History:  Procedure Laterality Date  .  ANTERIOR CERVICAL DECOMP/DISCECTOMY FUSION    . BACK SURGERY    . BREAST BIOPSY     left-non cancerous  . CARDIAC CATHETERIZATION    . CARPAL TUNNEL RELEASE Left   . CATARACT EXTRACTION W/PHACO  09/21/2011   Procedure: CATARACT EXTRACTION PHACO AND INTRAOCULAR LENS PLACEMENT (IOC);  Surgeon: Williams Che, MD;  Location: [facility];  Service: Ophthalmology;  Laterality: Left;  CDE:9.78  . CATARACT EXTRACTION W/PHACO Right 10/15/2014   Procedure: CATARACT EXTRACTION PHACO AND INTRAOCULAR LENS PLACEMENT ([place]);  Surgeon: Williams Che, MD;  Location: [facility];  Service: Ophthalmology;  Laterality: Right;  CDE:4.19  . [place]; 1972  . CHOLECYSTECTOMY OPEN    . COLONOSCOPY  2006   RMR: 1. Internal hemorroids, otherwise normal rectum. 2. Pedunculated polyp at 35 cm. reomved with snare. The remainder of teh colonic mucosa appeared normal.   . COLONOSCOPY  2011   Dr. Gala Romney: multiple ascending colon polyps and rectal polyp, adenomatous  . COLONOSCOPY N/A 05/31/2014   Procedure: COLONOSCOPY;  Surgeon: Daneil Dolin, MD;  [facility];  Service: Endoscopy;  Laterality: N/A;  130  . COLONOSCOPY WITH PROPOFOL N/A 06/24/2017   Procedure: COLONOSCOPY WITH  PROPOFOL;  Surgeon: Daneil Dolin, MD;  [facility];  Service: Endoscopy;  Laterality: N/A;  2:15pm  . ESOPHAGOGASTRODUODENOSCOPY  2006   Dr. Gala Romney: Subtle Schatzkis ring, otherwise normal upper GI tract, aside from a small pyloric channell erosion, status post dilation as described above.   Marland Kitchen FLEXIBLE SIGMOIDOSCOPY N/A 11/11/2017   Procedure: FLEXIBLE SIGMOIDOSCOPY;  Surgeon: Otis Brace, MD;  Location: [facility];  Service: Gastroenterology;  Laterality: N/A;  . JOINT REPLACEMENT    . LUMBAR DISC SURGERY  X 2  . POLYPECTOMY  06/24/2017   Procedure: POLYPECTOMY;  Surgeon: Daneil Dolin, MD;  [facility];  Service: Endoscopy;;  ascending  . POSTERIOR LUMBAR FUSION  ? date 1st fusion ; 07/2006    previous L4-5 Ray threaded fusion cage; Exploration of L4-5 fusion & PLIF 07/22/2006/notes 07/22/2006  . TONSILLECTOMY    . TOTAL KNEE ARTHROPLASTY Right 05/24/2013   Procedure: TOTAL KNEE ARTHROPLASTY;  Surgeon: [name], MD;  Location: [facility];  Service: Orthopedics;  Laterality: Right;  . TOTAL KNEE ARTHROPLASTY Left   . VAGINAL HYSTERECTOMY      There were no vitals filed for this visit.  Subjective Assessment - 09/29/18 1321    Subjective  "Everybody can understand me."    Currently in Pain?  No/denies            ADULT SLP TREATMENT - 09/29/18 1348      General Information   Behavior/Cognition  Alert;Cooperative;Pleasant mood      Treatment Provided   Treatment provided  Cognitive-Linquistic      Pain Assessment   Pain Assessment  No/denies pain      Cognitive-Linquistic Treatment   Treatment focused on  Dysarthria    Skilled Treatment  Pt reported she discussed her speech with her children and "everybody can understand me," and "this is how I talk." She reports using compensations when communicating with her husband, who is hard of hearing: "I don't talk to his back." Pt with good respiration/phonation coordination in sentences, 95% accuracy after initial instruction. Educated re: optimal posture for breath support. In 15 minutes conversation, pt >95% intelligible. Pt reports she is pleased with her communication with friends and family and in agreement with d/c at this time.      Progression Toward Goals   Progression toward goals  Goals partially met, education completed, patient discharged from [place] - 09/29/18 1404      SLP LONG TERM GOAL #1   Title  Pt will complete HEP for dysarthria/voice with mod I x 3 sessions    Time  3    Period  Weeks    Status  Not Met      SLP LONG TERM GOAL #2   Title  Pt will use abdominal breathing in 8 min simple conversation >80% of the time x 2 visits    Time  3    Period  Weeks    Status   Not Met      SLP LONG TERM GOAL #3   Title  Pt will achieve modal pitch within normal range (165-_0 ) in 5 minutes simple conversation x 2 visits.    Time  3    Period  Weeks    Status  Deferred   pt reports she feels her pitch in normal range for her     SLP LONG TERM GOAL #4   Title  Pt will report fewer requests from husband to repeat herself with compensations and environmental adjustments (closer proximity, lighting, background noise, etc) x 2 sessions.    Baseline  09/27/18, 09/29/18    Time  3    Period  Weeks    Status  Achieved       Plan - 09/29/18 1401    Clinical Impression Statement  Mrs. 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 Pt reports she is satisfied with her current communication abilities and that she is understood by family and friends. Pt satisfied with vocal quality despite hoarse, rough and low pitch; "this is how I talk." Pt reports she is communicating well with her husband, who has hearing loss, using environmental modifications. At this time pt is pleased with current functional level and in agreement with d/c at this time.    Speech Therapy Frequency  --   d/c   Duration  --   d/c   Treatment/Interventions  Compensatory strategies;Functional tasks;Cueing hierarchy;Patient/family education;Environmental controls;Compensatory techniques;SLP instruction and feedback    Potential to Achieve Goals  Good    Consulted and Agree with Plan of Care  Patient       Patient will benefit from skilled therapeutic intervention in order to improve the following deficits and impairments:   1. Dysarthria and anarthria       Problem List Patient Active Problem List   Diagnosis Date Noted  . Physical deconditioning 12/19/2017  . Chronic kidney disease (CKD), stage III (moderate) (HCC)   . Hypoalbuminemia due to protein-calorie malnutrition ([place])   . Intractable migraine without status migrainosus   . Reactive depression   .  Hemiparesis affecting right side as late effect of stroke ([place])   . Slow transit constipation   . PAF (paroxysmal atrial fibrillation) ([place])   . Dysphasia, post-stroke   . COPD (chronic obstructive pulmonary disease) ([place]) 10/29/2017  . Chronic diastolic CHF (congestive heart failure) ([place]) 10/29/2017  . Hypothyroidism 10/29/2017  . Essential hypertension 10/29/2017  . GERD (gastroesophageal reflux disease) 10/29/2017  . Anxiety 10/29/2017  . 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 Hyperlipidemia 04/20/2013   SPEECH THERAPY DISCHARGE SUMMARY  Visits from Start of Care: 3  Current functional level related to goals / functional outcomes: Pt reports she is pleased with current functional level and is able to communicate with family and friends without difficulty.   Remaining deficits: Mild-moderate dysarthria persists, with low pitch and gravelly vocal quality.   Education / Equipment: Posture, respiratory support for speech, environmental modifications to enhance communication with spouse (hearing loss) Plan: Patient agrees to discharge.  Patient goals were partially met. Patient is being discharged due to being pleased with the current functional level.  ?????         Deneise Lever, [place], CCC-SLP Speech-Language Pathologist  Aliene Altes 09/29/2018, 5:21 PM  [facility] [address] [place], [place], [address] Phone: [phone]   Fax:  [phone]   Name: MIKAL WISMAN MRN: [id number] Date of Birth: August 09, 1945

## 2018-09-29 NOTE — Therapy (Signed)
[facility] [address] [place], [place], [address] Phone: [phone]   Fax:  [phone]  Occupational Therapy Treatment  Patient Details  Name: Holly Flores MRN: [id number] Date of Birth: 11/19/1945 Referring Provider (OT): Dr. Octavio Graves   Encounter Date: 09/29/2018  OT End of Session - 09/29/18 1418    Visit Number  4    Number of Visits  16    Date for OT Re-Evaluation  11/23/18    Authorization Type  [employer] primary, [employer] secondary    Authorization - Visit Number  4    Authorization - Number of Visits  10    OT Start Time  1145    OT Stop Time  1230    OT Time Calculation (min)  45 min    Activity Tolerance  Patient tolerated treatment well    Behavior During Therapy  [facility] for tasks assessed/performed       Past Medical History:  Diagnosis Date  . Anxiety   . Arthritis    "all over" (12/20/2017)  . Central retinal vein occlusion of left eye 01/22/2015  . CHF (congestive heart failure) ([place])   . Chronic bronchitis ([place])    "get it most years" (12/20/2017)  . Chronic lower back pain   . CKD (chronic kidney disease), stage II   . COPD (chronic obstructive pulmonary disease) ([place])   . CVA (cerebral vascular accident) ([place]) 12/30/2014   OCULAR  LEFT  EYE   . Depression   . Fibromyalgia   . GERD (gastroesophageal reflux disease)   . Goiter   . History of colonic polyps 05/14/2014  . Hyperlipidemia   . Hypertension   . Hypothyroidism   . Kidney cysts    left side  . 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 Migraine    "couple times/wk; been having them for a long time" (12/20/2017)  . Myocardial infarction [facility])    "one dr said I did; one said I didn't;  don't remember when it was" (12/20/2017)  . 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 On home oxygen therapy    "3L; sleep w/it q night" (12/20/2017)  . Pneumonia    "several times" (12/20/2017)  . Shortness of  breath   . Sleep apnea    "stopped mask when I got on the oxygen" (12/20/2017)  . Stroke [facility])     Past Surgical History:  Procedure Laterality Date  . ANTERIOR CERVICAL DECOMP/DISCECTOMY FUSION    . BACK SURGERY    . BREAST BIOPSY     left-non cancerous  . CARDIAC CATHETERIZATION    . CARPAL TUNNEL RELEASE Left   . CATARACT EXTRACTION W/PHACO  09/21/2011   Procedure: CATARACT EXTRACTION PHACO AND INTRAOCULAR LENS PLACEMENT (I[facility]);  Surgeon: Williams Che, MD;  Location: [facility];  Service: Ophthalmology;  Laterality: Left;  CDE:9.78  . CATARACT EXTRACTION W/PHACO Right 10/15/2014   Procedure: CATARACT EXTRACTION PHACO AND INTRAOCULAR LENS PLACEMENT ([place]);  Surgeon: Williams Che, MD;  Location: [facility];  Service: Ophthalmology;  Laterality: Right;  CDE:4.19  . [place]; 1972  . CHOLECYSTECTOMY OPEN    . COLONOSCOPY  2006   RMR: 1. Internal hemorroids, otherwise normal rectum. 2. Pedunculated polyp at 35 cm. reomved with snare. The remainder of teh colonic mucosa appeared normal.   . COLONOSCOPY  2011   Dr. Gala Romney: multiple ascending colon polyps and rectal polyp, adenomatous  . COLONOSCOPY N/A 05/31/2014  Procedure: COLONOSCOPY;  Surgeon: Daneil Dolin, MD;  [facility];  Service: Endoscopy;  Laterality: N/A;  130  . COLONOSCOPY WITH PROPOFOL N/A 06/24/2017   Procedure: COLONOSCOPY WITH PROPOFOL;  Surgeon: Daneil Dolin, MD;  [facility];  Service: Endoscopy;  Laterality: N/A;  2:15pm  . ESOPHAGOGASTRODUODENOSCOPY  2006   Dr. Gala Romney: Subtle Schatzkis ring, otherwise normal upper GI tract, aside from a small pyloric channell erosion, status post dilation as described above.   Marland Kitchen FLEXIBLE SIGMOIDOSCOPY N/A 11/11/2017   Procedure: FLEXIBLE SIGMOIDOSCOPY;  Surgeon: Otis Brace, MD;  Location: [facility];  Service: Gastroenterology;  Laterality: N/A;  . JOINT REPLACEMENT    . LUMBAR DISC SURGERY  X 2  . POLYPECTOMY  06/24/2017   Procedure:  POLYPECTOMY;  Surgeon: Daneil Dolin, MD;  [facility];  Service: Endoscopy;;  ascending  . POSTERIOR LUMBAR FUSION  ? date 1st fusion ; 07/2006   previous L4-5 Ray threaded fusion cage; Exploration of L4-5 fusion & PLIF 07/22/2006/notes 07/22/2006  . TONSILLECTOMY    . TOTAL KNEE ARTHROPLASTY Right 05/24/2013   Procedure: TOTAL KNEE ARTHROPLASTY;  Surgeon: [name], MD;  Location: [facility];  Service: Orthopedics;  Laterality: Right;  . TOTAL KNEE ARTHROPLASTY Left   . VAGINAL HYSTERECTOMY      There were no vitals filed for this visit.  Subjective Assessment - 09/29/18 1153    Pertinent History  CVA 10/2017. PMH: CHF, COPD, CKD (Stage II), fibromyalgia, depression. (Husband, who is primary caregiver, also has cancer)    Limitations  montior BP, obesity, needs DME to transfer (sliding board or stand assist)    Currently in Pain?  No/denies       Transfer w/c to mat on weaker side w/ min to mod assist only. Pt static sitting I'ly and dynamic sitting w/ close supervision as pt tends to fall to Rt side when cross reaching LUE.  Standing using Steady w/ assist to place Rt foot and lock device, but pt able to pull herself up w/ little to no help. Practiced sit to stand in Steady w/ assist to operate device and cue pt to shift weight to Lt side to clear Rt arm. Progressed to simulated clothing management in prep for toileting w/ Steady - pt able to maintain standing positioning in Steady holding herself up w/ LUE so caregiver could don/doff pants over hips.                       OT Short Term Goals - 09/29/18 1423      OT SHORT TERM GOAL #1   Title  Independent with HEP for RUE (Caregiver assisted and self ROM as able)    Time  4    Period  Weeks    Status  On-going      OT SHORT TERM GOAL #2   Title  Pt/family to verbalize understanding with A/E (rocker knife, adapted shoes, extended brush, LH sponge, etc) to increase independence with BADLS    Time  4     Period  Weeks    Status  On-going      OT SHORT TERM GOAL #3   Title  Pt/family to verbalize understanding with use of stand assist (Steady) with toileting and transfers to decrease burden of care on caregiver    Time  4    Period  Weeks    Status  On-going      OT SHORT TERM  GOAL #4   Title  Pt to demo sliding board transfers consistently w/ mod assist or less for level transfer from w/c to/from bed    Time  4    Period  Weeks    Status  Achieved        OT Long Term Goals - 08/23/18 1618      OT LONG TERM GOAL #1   Title  Pt to demo independence with HEP for Lt shoulder (once pt seen by ortho MD)    Time  8    Period  Weeks    Status  New      OT LONG TERM GOAL #2   Title  Pt to be min assist for UE dressing and bathing    Time  8    Period  Weeks    Status  New      OT LONG TERM GOAL #3   Title  Pt to cut food and brush hair w/ A/E independently    Time  8    Period  Weeks    Status  New      OT LONG TERM GOAL #4   Title  Pt to perform toilet transfer w/ mod assist using correct DME prn    Time  8    Period  Weeks    Status  New      OT LONG TERM GOAL #5   Title  Pt to perform LE dressing at max assist, LE bathing at min assist w/ use of LH sponge    Time  8    Period  Weeks    Status  New            Plan - 09/29/18 1423    Clinical Impression Statement  Pt progressing towards all STG's. Pt with improved sitting balance and posture today.    Occupational performance deficits (Please refer to evaluation for details):  ADL's;IADL's;Rest and Sleep    Body Structure / Function / Physical Skills  ADL;ROM;Vision;IADL;Edema;Body mechanics;Endurance;Mobility;Strength;Coordination;Obesity;UE functional use;Pain;Decreased knowledge of use of DME    Rehab Potential  Fair    Comorbidities impacting occupational performance description:  obesity, CHF, COPD, Lt shoulder limitations    OT Frequency  2x / week    OT Duration  8 weeks    OT Treatment/Interventions   Self-care/ADL training;Therapeutic exercise;Functional Mobility Training;Neuromuscular education;Splinting;Manual Therapy;Therapeutic activities;DME and/or AE instruction;Aquatic Therapy;Passive range of motion;Patient/family education;Visual/perceptual remediation/compensation    Plan  monitor BP, review HEP, trunk balance/control, practice use of Steady Assist again if family here for education       Patient will benefit from skilled therapeutic intervention in order to improve the following deficits and impairments:   Body Structure / Function / Physical Skills: ADL, ROM, Vision, IADL, Edema, Body mechanics, Endurance, Mobility, Strength, Coordination, Obesity, UE functional use, Pain, Decreased knowledge of use of DME       Visit Diagnosis: 1. Abnormal posture   2. Muscle weakness (generalized)   3. Other symptoms and signs involving the nervous system   4. 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 Physical deconditioning 12/19/2017  . Chronic kidney disease (CKD), stage III (moderate) (HCC)   . Hypoalbuminemia due to protein-calorie malnutrition ([place])   . Intractable migraine without status migrainosus   . Reactive depression   . Hemiparesis affecting right side as late effect of  stroke ([place])   . Slow transit constipation   . PAF (paroxysmal atrial fibrillation) ([place])   . Dysphasia, post-stroke   . COPD (chronic obstructive pulmonary disease) ([place]) 10/29/2017  . Chronic diastolic CHF (congestive heart failure) ([place]) 10/29/2017  . Hypothyroidism 10/29/2017  . Essential hypertension 10/29/2017  . GERD (gastroesophageal reflux disease) 10/29/2017  . Anxiety 10/29/2017  . 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 Hyperlipidemia 04/20/2013    Carey Bullocks, OTR/L 09/29/2018, 2:25 PM  [facility] [address] [place], [place], [address] Phone: [phone]   Fax:  [phone]  Name: JOHNETTE TEIGEN MRN: [id number] Date of Birth: 04-16-1945

## 2018-09-29 NOTE — Patient Instructions (Addendum)
You don't need to practice gliding your pitch. You can continue practicing your deep breathing.   If you notice your voice getting low or running out, remember you can fix this by breathing. More air gives you more power to project your voice.

## 2018-09-30 NOTE — Therapy (Signed)
[facility] [address] [name], [place], [address] Phone: [phone]   Fax:  [phone]  Physical Therapy Treatment  Patient Details  Name: Holly Flores MRN: [id number] Date of Birth: [birth year] Referring Provider (PT): Octavio Graves, DO   Encounter Date: 09/29/2018  PT End of Session - 09/30/18 0854    Visit Number  18    Number of Visits  26    Date for PT Re-Evaluation  10/18/18    Authorization Type  Progress note every 10th visit; KX modifier at 15th visit.    PT Start Time  1230    PT Stop Time  1315    PT Time Calculation (min)  45 min    Activity Tolerance  Treatment limited secondary to medical complications (Comment)   elevated BP   Behavior During Therapy  WFL for tasks assessed/performed       Past Medical History:  Diagnosis Date  . Anxiety   . Arthritis    "all over" (12/20/2017)  . Central retinal vein occlusion of left eye 01/22/2015  . CHF (congestive heart failure) ([place])   . Chronic bronchitis ([place])    "get it most years" (12/20/2017)  . Chronic lower back pain   . CKD (chronic kidney disease), stage II   . COPD (chronic obstructive pulmonary disease) ([place])   . CVA (cerebral vascular accident) ([place]) 12/30/2014   OCULAR  LEFT  EYE   . Depression   . Fibromyalgia   . GERD (gastroesophageal reflux disease)   . Goiter   . History of colonic polyps 05/14/2014  . Hyperlipidemia   . Hypertension   . Hypothyroidism   . Kidney cysts    left side  . 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 Migraine    "couple times/wk; been having them for a long time" (12/20/2017)  . Myocardial infarction [facility])    "one dr said I did; one said I didn't;  don't remember when it was" (12/20/2017)  . 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 On home oxygen therapy    "3L; sleep w/it q night" (12/20/2017)  . Pneumonia    "several times" (12/20/2017)  . Shortness of breath    . Sleep apnea    "stopped mask when I got on the oxygen" (12/20/2017)  . Stroke [facility])     Past Surgical History:  Procedure Laterality Date  . ANTERIOR CERVICAL DECOMP/DISCECTOMY FUSION    . BACK SURGERY    . BREAST BIOPSY     left-non cancerous  . CARDIAC CATHETERIZATION    . CARPAL TUNNEL RELEASE Left   . CATARACT EXTRACTION W/PHACO  09/21/2011   Procedure: CATARACT EXTRACTION PHACO AND INTRAOCULAR LENS PLACEMENT (IOC);  Surgeon: Williams Che, MD;  Location: [facility];  Service: Ophthalmology;  Laterality: Left;  CDE:9.78  . CATARACT EXTRACTION W/PHACO Right 10/15/2014   Procedure: CATARACT EXTRACTION PHACO AND INTRAOCULAR LENS PLACEMENT ([place]);  Surgeon: Williams Che, MD;  Location: [facility];  Service: Ophthalmology;  Laterality: Right;  CDE:4.19  . [place]; 1972  . CHOLECYSTECTOMY OPEN    . COLONOSCOPY  2006   RMR: 1. Internal hemorroids, otherwise normal rectum. 2. Pedunculated polyp at 35 cm. reomved with snare. The remainder of teh colonic mucosa appeared normal.   . COLONOSCOPY  2011   Dr. Gala Romney: multiple ascending colon polyps and rectal polyp, adenomatous  . COLONOSCOPY N/A 05/31/2014   Procedure: COLONOSCOPY;  Surgeon: Herbie Baltimore  Hilton Cork, MD;  Location: [facility];  Service: Endoscopy;  Laterality: N/A;  130  . COLONOSCOPY WITH PROPOFOL N/A 06/24/2017   Procedure: COLONOSCOPY WITH PROPOFOL;  Surgeon: Daneil Dolin, MD;  Location[facility];  Service: Endoscopy;  Laterality: N/A;  2:15pm  . ESOPHAGOGASTRODUODENOSCOPY  2006   Dr. Gala Romney: Subtle Schatzkis ring, otherwise normal upper GI tract, aside from a small pyloric channell erosion, status post dilation as described above.   Marland Kitchen FLEXIBLE SIGMOIDOSCOPY N/A 11/11/2017   Procedure: FLEXIBLE SIGMOIDOSCOPY;  Surgeon: Otis Brace, MD;  Location: [facility];  Service: Gastroenterology;  Laterality: N/A;  . JOINT REPLACEMENT    . LUMBAR DISC SURGERY  X 2  . POLYPECTOMY  06/24/2017   Procedure: POLYPECTOMY;   Surgeon: Daneil Dolin, MD;  Location: [facility];  Service: Endoscopy;;  ascending  . POSTERIOR LUMBAR FUSION  ? date 1st fusion ; 07/2006   previous L4-5 Ray threaded fusion cage; Exploration of L4-5 fusion & PLIF 07/22/2006/notes 07/22/2006  . TONSILLECTOMY    . TOTAL KNEE ARTHROPLASTY Right 05/24/2013   Procedure: TOTAL KNEE ARTHROPLASTY;  Surgeon: [name], MD;  Location: [facility];  Service: Orthopedics;  Laterality: Right;  . TOTAL KNEE ARTHROPLASTY Left   . VAGINAL HYSTERECTOMY      Vitals:   09/29/18 1248  BP: (!) 140/100    Subjective Assessment - 09/29/18 1249    Subjective  Husband is still in the hospital.  Pt still under a lot of stress, family is looking for options for in home care.  Information provided by OT for [facility], [facility] and [facility].  Pt did not get to take new BP medication today due to coming in early.    Pertinent History  left sided CVA 10/29/17, Chronic CHF, AFib, COPD, Chronic Kidney Disease stage III, hx of bil TKA, Lumbar surgery    Patient Stated Goals  move around better in the home and improve function    Currently in Pain?  No/denies    Pain Onset  More than a month ago                       [facility] Adult PT Treatment/Exercise - 09/30/18 0758      Transfers   Transfers  Sit to Stand;Stand to Sit    Sit to Stand  4: Min assist    Sit to Stand Details (indicate cue type and reason)  Performed multiple sit > stand from w/c and elevated sitting position on wedge and on Stedy seat.  When cued to bring head to midline and trunk to midline before performing anterior lean and weight shift pt able to stand with min A; continues to require some assistance to extend RLE; no longer requires therapist to hold hip in extension while placing or removing stedy seat pads    Stand to Sit  4: Min assist    Stand to Sit Details  cues to shift hips to R for more equal WB on hips/pelvis in chair    Transfer via [place]  Yes      Dynamic Sitting Balance   Dynamic Sitting - Balance Support  Right upper extremity supported;Feet supported;During functional activity    Dynamic Sitting - Level of Assistance  3: Mod assist    Dynamic Sitting - Balance Activities  Lateral lean/weight shifting;Forward lean/weight shifting;Reaching for objects;Reaching across midline;Head control activities;Trunk control activities  Sitting balance - Comments  Performed both from elevated sitting on wedge on mat and also from elevated sitting on Stedy.  Performed reaching up, forwards and to the L for active R sided trunk shortening during weight shift with cues to bring head back to midline through R lateral flexion of cervical spine.  Also cued pt to rotated head to R and reach across midline to retrieve object while maintaining upright trunk posture      Dynamic Standing Balance   Dynamic Standing - Balance Support  Right upper extremity supported    Dynamic Standing - Level of Assistance  3: Mod assist    Dynamic Standing - Balance Activities  Lateral lean/weight shifting;Forward lean/weight shifting;Reaching for objects    Reaching for objects comments:  In standing continued reaching up, forwards and to the L with continued focus on head righting, R trunk shortening and extension through RLE.      Therapeutic Activites    Therapeutic Activities  Other Therapeutic Activities    Other Therapeutic Activities  continued discussion with pt regarding process for obtaining Stedy lift for home to decrease burden of care with transfers.  [employer] has submitted a quote and is asking for LMN from PT.  Continued to also review options with patient for daily care and supervision - information provided to family last session by OT.  Pt asking about progress and prognosis for recovery to be able to walk or if she should consider a power w/c.  Discussed current progress and prognosis 1 year out from CVA; discussed waiting until pt receives  AFO and participates in training with AFO to determine if pt would benefit from power wheelchair.  Pt will also need better control of HTN and ability to carryover training at home in order to further progress.  Will likely need to discuss with family soon.             PT Education - 09/30/18 0853    Education Details  see TA - will likely need to discuss with family    Person(s) Educated  Patient    Methods  Explanation    Comprehension  Verbalized understanding       PT Short Term Goals - 09/27/18 1341      PT SHORT TERM GOAL #1   Title  Pt and family will be independent with supine, seated and standing HEP    Time  4    Status  New    Target Date  09/18/18      PT SHORT TERM GOAL #2   Title  Pt will perform slideboard transfers to left and right on level surfaces with min A of family    Baseline  Mod to R, Min to L    Time  4    Period  Weeks    Status  New    Target Date  09/18/18      PT SHORT TERM GOAL #3   Title  Pt will perform bed mobility on flat mat with mod A (supine <> sit and rolling)    Baseline  MET 09/27/2018    Time  4    Period  Weeks    Status  Achieved    Target Date  09/18/18      PT SHORT TERM GOAL #4   Title  Pt will perform sit > stand pushing from wheelchair with mod A on R side    Time  4    Period  Weeks  Status  New    Target Date  09/18/18      PT SHORT TERM GOAL #5   Title  Pt will ambulate x 25' with RW with hand orthosis, trial AFO and mod-max A of one therapist    Time  4    Period  Weeks    Status  Revised    Target Date  09/18/18        PT Long Term Goals - 08/19/18 0916      PT LONG TERM GOAL #1   Title  Patient and family will be independent with final HEP recommendations    Time  8    Period  Weeks    Status  Revised    Target Date  10/18/18      PT LONG TERM GOAL #2   Title  Patient will perform sit <> stand from wheelchair with min A and transfer stand pivot with RW and AFO with Mod A of one person    Time   8    Period  Weeks    Status  Revised    Target Date  10/18/18      PT LONG TERM GOAL #3   Title  Pt will perform bed mobility on flat mat with min A    Time  8    Period  Weeks    Status  Revised    Target Date  10/18/18      PT LONG TERM GOAL #4   Title  Family will demonstrate independent ability to don appropriate AFO with R shoe    Time  8    Period  Weeks    Status  New    Target Date  10/18/18      PT LONG TERM GOAL #5   Title  Patient will ambulate 40 feet or greater with min A, AFO, and LRAD to safely navigate around home.    Time  8    Period  Weeks    Status  Revised    Target Date  10/18/18            Plan - 09/30/18 0854    Clinical Impression Statement  Unable to complete assessment of STG due to elevated BP today; pt report not taking her BP medication this morning due to leaving early to arrive for therapy.  Will need to continue to discuss with family as elevated BP is limiting multiple therapy sessions.  Treatment session continued to focus on seated and standing dynamic balance, trunk control and head righting with reaching out of BOS.  Pt is demonstrating improved postural control and ability to perform sit <> stand with decreased assistance from therapist.  Will continue to address and will attempt to assess STG at next session if BP is within parameters.  Will also continue to discuss BP management, prognosis for recovery and how to maximize safety and function in the home environment.    Personal Factors and Comorbidities  Age;Comorbidity 3+;Time since onset of injury/illness/exacerbation    Examination-Activity Limitations  Lift;Dressing;Self Feeding;Transfers;Toileting;Stand;Locomotion Level;Reach Overhead;Bed Mobility    Rehab Potential  Fair    PT Frequency  2x / week    PT Duration  8 weeks    PT Treatment/Interventions  ADLs/Self Care Home Management;Electrical Stimulation;Gait training;Stair training;Functional mobility training;Balance  training;Therapeutic exercise;Therapeutic activities;Neuromuscular re-education;Wheelchair mobility training;Manual techniques;Passive range of motion;Patient/family education;DME Instruction;Energy conservation;Orthotic Fit/Training    PT Next Visit Plan  MONITOR BP EVERY SESSION WITH MANUAL CUFF ON LUE; Check remaining  STGs if BP allows.  Seated mirror therapy for LE.  Stedy transfers and sit <> stand on Stedy without UE support.  Seated on large rockerboard or inverted BOSU performing lateral tilts or anterior/posterior for sit <> stand.  When she receives her AFO begin using for transfers, standing and gait.  Work on decreasing assistance needed to perform level slideboard transfers to L and R.  Work on bed mobility on flat bed.  Add to HEP: WB exercises for mm activation    Recommended Other Services  have sent LMN for Stedy.  Need to have meeting with family about BP and prognosis/expectations.    Consulted and Agree with Plan of Care  Patient;Family member/caregiver    Family Member Consulted  daughter       Patient will benefit from skilled therapeutic intervention in order to improve the following deficits and impairments:  Pain, Postural dysfunction, Impaired tone, Decreased mobility, Decreased activity tolerance, Decreased range of motion, Decreased strength, Impaired UE functional use, Difficulty walking, Decreased balance, Abnormal gait, Decreased endurance  Visit Diagnosis: 1. 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 Muscle weakness (generalized)   3. Other symptoms and signs involving the nervous system   4. Difficulty in walking, not elsewhere classified   5. Abnormal posture        Problem List Patient Active Problem List   Diagnosis Date Noted  . Physical deconditioning 12/19/2017  . Chronic kidney disease (CKD), stage III (moderate) ([facility])   . Hypoalbuminemia due to protein-calorie malnutrition ([place])   . Intractable  migraine without status migrainosus   . Reactive depression   . Hemiparesis affecting right side as late effect of stroke ([place])   . Slow transit constipation   . PAF (paroxysmal atrial fibrillation) ([place])   . Dysphasia, post-stroke   . COPD (chronic obstructive pulmonary disease) ([place]) 10/29/2017  . Chronic diastolic CHF (congestive heart failure) ([place]) 10/29/2017  . Hypothyroidism 10/29/2017  . Essential hypertension 10/29/2017  . GERD (gastroesophageal reflux disease) 10/29/2017  . Anxiety 10/29/2017  . 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 Hyperlipidemia 04/20/2013    Rico Junker, PT, DPT 09/30/18    9:16 AM     [facility] [address] [place], [place], [address] Phone: [phone]   Fax:  [phone]  Name: KALLIOPE RIESEN MRN: [id number] Date of Birth: 09/07/1945

## 2018-10-01 ENCOUNTER — Emergency Department (HOSPITAL_COMMUNITY): Payer: Medicare Other

## 2018-10-01 ENCOUNTER — Inpatient Hospital Stay (HOSPITAL_COMMUNITY)
Admission: EM | Admit: 2018-10-01 | Discharge: 2018-10-03 | DRG: 064 | Disposition: A | Discharge status: Home / Self Care | Payer: Medicare Other | Attending: Internal Medicine | Admitting: Internal Medicine

## 2018-10-01 ENCOUNTER — Other Ambulatory Visit: Payer: Self-pay

## 2018-10-01 ENCOUNTER — Encounter (HOSPITAL_COMMUNITY): Payer: Self-pay | Admitting: Emergency Medicine

## 2018-10-01 DIAGNOSIS — I639 Cerebral infarction, unspecified: Secondary | ICD-10-CM | POA: Diagnosis not present

## 2018-10-01 DIAGNOSIS — I252 Old myocardial infarction: Secondary | ICD-10-CM

## 2018-10-01 DIAGNOSIS — R29725 NIHSS score 25: Secondary | ICD-10-CM | POA: Diagnosis present

## 2018-10-01 DIAGNOSIS — Z993 Dependence on wheelchair: Secondary | ICD-10-CM

## 2018-10-01 DIAGNOSIS — Z882 Allergy status to sulfonamides status: Secondary | ICD-10-CM

## 2018-10-01 DIAGNOSIS — Z90710 Acquired absence of both cervix and uterus: Secondary | ICD-10-CM

## 2018-10-01 DIAGNOSIS — Z9841 Cataract extraction status, right eye: Secondary | ICD-10-CM

## 2018-10-01 DIAGNOSIS — Z9049 Acquired absence of other specified parts of digestive tract: Secondary | ICD-10-CM

## 2018-10-01 DIAGNOSIS — J449 Chronic obstructive pulmonary disease, unspecified: Secondary | ICD-10-CM | POA: Diagnosis present

## 2018-10-01 DIAGNOSIS — Z7901 Long term (current) use of anticoagulants: Secondary | ICD-10-CM

## 2018-10-01 DIAGNOSIS — K219 Gastro-esophageal reflux disease without esophagitis: Secondary | ICD-10-CM | POA: Diagnosis present

## 2018-10-01 DIAGNOSIS — I480 Paroxysmal atrial fibrillation: Secondary | ICD-10-CM | POA: Diagnosis present

## 2018-10-01 DIAGNOSIS — E039 Hypothyroidism, unspecified: Secondary | ICD-10-CM | POA: Diagnosis present

## 2018-10-01 DIAGNOSIS — Z87891 Personal history of nicotine dependence: Secondary | ICD-10-CM

## 2018-10-01 DIAGNOSIS — G9341 Metabolic encephalopathy: Secondary | ICD-10-CM | POA: Diagnosis present

## 2018-10-01 DIAGNOSIS — I130 Hypertensive heart and chronic kidney disease with heart failure and stage 1 through stage 4 chronic kidney disease, or unspecified chronic kidney disease: Secondary | ICD-10-CM | POA: Diagnosis present

## 2018-10-01 DIAGNOSIS — Z9842 Cataract extraction status, left eye: Secondary | ICD-10-CM

## 2018-10-01 DIAGNOSIS — R4701 Aphasia: Secondary | ICD-10-CM | POA: Diagnosis present

## 2018-10-01 DIAGNOSIS — M797 Fibromyalgia: Secondary | ICD-10-CM | POA: Diagnosis present

## 2018-10-01 DIAGNOSIS — E785 Hyperlipidemia, unspecified: Secondary | ICD-10-CM | POA: Diagnosis present

## 2018-10-01 DIAGNOSIS — G43909 Migraine, unspecified, not intractable, without status migrainosus: Secondary | ICD-10-CM | POA: Diagnosis present

## 2018-10-01 DIAGNOSIS — I5032 Chronic diastolic (congestive) heart failure: Secondary | ICD-10-CM | POA: Diagnosis present

## 2018-10-01 DIAGNOSIS — Z833 Family history of diabetes mellitus: Secondary | ICD-10-CM

## 2018-10-01 DIAGNOSIS — I6389 Other cerebral infarction: Secondary | ICD-10-CM | POA: Diagnosis not present

## 2018-10-01 DIAGNOSIS — Z8249 Family history of ischemic heart disease and other diseases of the circulatory system: Secondary | ICD-10-CM

## 2018-10-01 DIAGNOSIS — Z981 Arthrodesis status: Secondary | ICD-10-CM

## 2018-10-01 DIAGNOSIS — Z880 Allergy status to penicillin: Secondary | ICD-10-CM

## 2018-10-01 DIAGNOSIS — G4733 Obstructive sleep apnea (adult) (pediatric): Secondary | ICD-10-CM | POA: Diagnosis present

## 2018-10-01 DIAGNOSIS — N183 Chronic kidney disease, stage 3 (moderate): Secondary | ICD-10-CM | POA: Diagnosis present

## 2018-10-01 DIAGNOSIS — I6340 Cerebral infarction due to embolism of unspecified cerebral artery: Secondary | ICD-10-CM | POA: Diagnosis present

## 2018-10-01 DIAGNOSIS — Z96653 Presence of artificial knee joint, bilateral: Secondary | ICD-10-CM | POA: Diagnosis present

## 2018-10-01 DIAGNOSIS — G8929 Other chronic pain: Secondary | ICD-10-CM | POA: Diagnosis present

## 2018-10-01 DIAGNOSIS — F419 Anxiety disorder, unspecified: Secondary | ICD-10-CM | POA: Diagnosis present

## 2018-10-01 DIAGNOSIS — Z79890 Hormone replacement therapy (postmenopausal): Secondary | ICD-10-CM

## 2018-10-01 DIAGNOSIS — R4182 Altered mental status, unspecified: Secondary | ICD-10-CM | POA: Diagnosis present

## 2018-10-01 DIAGNOSIS — Z20828 Contact with and (suspected) exposure to other viral communicable diseases: Secondary | ICD-10-CM | POA: Diagnosis present

## 2018-10-01 DIAGNOSIS — Z825 Family history of asthma and other chronic lower respiratory diseases: Secondary | ICD-10-CM

## 2018-10-01 DIAGNOSIS — Z79899 Other long term (current) drug therapy: Secondary | ICD-10-CM

## 2018-10-01 DIAGNOSIS — Z791 Long term (current) use of non-steroidal anti-inflammatories (NSAID): Secondary | ICD-10-CM

## 2018-10-01 DIAGNOSIS — I671 Cerebral aneurysm, nonruptured: Secondary | ICD-10-CM | POA: Diagnosis present

## 2018-10-01 DIAGNOSIS — Z8719 Personal history of other diseases of the digestive system: Secondary | ICD-10-CM

## 2018-10-01 DIAGNOSIS — Z823 Family history of stroke: Secondary | ICD-10-CM

## 2018-10-01 DIAGNOSIS — R569 Unspecified convulsions: Secondary | ICD-10-CM | POA: Diagnosis not present

## 2018-10-01 DIAGNOSIS — I69351 Hemiplegia and hemiparesis following cerebral infarction affecting right dominant side: Secondary | ICD-10-CM | POA: Diagnosis not present

## 2018-10-01 LAB — DIFFERENTIAL
Basophils Absolute: 0.1 10*3/uL (ref 0.0–0.1)
Basophils Relative: 1 %
Eosinophils Absolute: 1.1 10*3/uL — ABNORMAL HIGH (ref 0.0–0.5)
Eosinophils Relative: 12 %
Lymphocytes Relative: 46 %
Lymphs Abs: 4.1 10*3/uL — ABNORMAL HIGH (ref 0.7–4.0)
Monocytes Absolute: 0.7 10*3/uL (ref 0.1–1.0)
Monocytes Relative: 8 %
Neutro Abs: 3 10*3/uL (ref 1.7–7.7)
Neutrophils Relative %: 34 %

## 2018-10-01 LAB — CBC
HCT: 41 % (ref 36.0–46.0)
Hemoglobin: 13 g/dL (ref 12.0–15.0)
MCH: 31.7 pg (ref 26.0–34.0)
MCHC: 31.7 g/dL (ref 30.0–36.0)
MCV: 100 fL (ref 80.0–100.0)
Platelets: 200 10*3/uL (ref 150–400)
RBC: 4.1 MIL/uL (ref 3.87–5.11)
RDW: 14.6 % (ref 11.5–15.5)
WBC: 8.8 10*3/uL (ref 4.0–10.5)
nRBC: 0 % (ref 0.0–0.2)

## 2018-10-01 LAB — URINALYSIS, ROUTINE W REFLEX MICROSCOPIC
Bilirubin Urine: NEGATIVE
Glucose, UA: NEGATIVE mg/dL
Hgb urine dipstick: NEGATIVE
Ketones, ur: NEGATIVE mg/dL
Leukocytes,Ua: NEGATIVE
Nitrite: NEGATIVE
Protein, ur: NEGATIVE mg/dL
Specific Gravity, Urine: 1.019 (ref 1.005–1.030)
pH: 7 (ref 5.0–8.0)

## 2018-10-01 LAB — I-STAT CHEM 8, ED
BUN: 22 mg/dL (ref 8–23)
Calcium, Ion: 1.25 mmol/L (ref 1.15–1.40)
Chloride: 104 mmol/L (ref 98–111)
Creatinine, Ser: 1.4 mg/dL — ABNORMAL HIGH (ref 0.44–1.00)
Glucose, Bld: 103 mg/dL — ABNORMAL HIGH (ref 70–99)
HCT: 41 % (ref 36.0–46.0)
Hemoglobin: 13.9 g/dL (ref 12.0–15.0)
Potassium: 4.1 mmol/L (ref 3.5–5.1)
Sodium: 143 mmol/L (ref 135–145)
TCO2: 28 mmol/L (ref 22–32)

## 2018-10-01 LAB — COMPREHENSIVE METABOLIC PANEL
ALT: 9 U/L (ref 0–44)
AST: 11 U/L — ABNORMAL LOW (ref 15–41)
Albumin: 3.9 g/dL (ref 3.5–5.0)
Alkaline Phosphatase: 72 U/L (ref 38–126)
Anion gap: 6 (ref 5–15)
BUN: 23 mg/dL (ref 8–23)
CO2: 28 mmol/L (ref 22–32)
Calcium: 9.1 mg/dL (ref 8.9–10.3)
Chloride: 106 mmol/L (ref 98–111)
Creatinine, Ser: 1.3 mg/dL — ABNORMAL HIGH (ref 0.44–1.00)
GFR calc Af Amer: 47 mL/min — ABNORMAL LOW (ref >60)
GFR calc non Af Amer: 41 mL/min — ABNORMAL LOW (ref >60)
Glucose, Bld: 110 mg/dL — ABNORMAL HIGH (ref 70–99)
Potassium: 4.2 mmol/L (ref 3.5–5.1)
Sodium: 140 mmol/L (ref 135–145)
Total Bilirubin: 0.5 mg/dL (ref 0.3–1.2)
Total Protein: 7.5 g/dL (ref 6.5–8.1)

## 2018-10-01 LAB — RAPID URINE DRUG SCREEN, HOSP PERFORMED
Amphetamines: NOT DETECTED
Barbiturates: NOT DETECTED
Benzodiazepines: POSITIVE — AB
Cocaine: NOT DETECTED
Opiates: NOT DETECTED
Tetrahydrocannabinol: NOT DETECTED

## 2018-10-01 LAB — ETHANOL: Alcohol, Ethyl (B): <10 mg/dL (ref <10)

## 2018-10-01 LAB — PROTIME-INR
INR: 1.5 — ABNORMAL HIGH (ref 0.8–1.2)
Prothrombin Time: 17.6 seconds — ABNORMAL HIGH (ref 11.4–15.2)

## 2018-10-01 LAB — APTT: aPTT: 40 seconds — ABNORMAL HIGH (ref 24–36)

## 2018-10-01 LAB — CBG MONITORING, ED: Glucose-Capillary: 107 mg/dL — ABNORMAL HIGH (ref 70–99)

## 2018-10-01 LAB — SARS CORONAVIRUS 2 BY RT PCR (HOSPITAL ORDER, PERFORMED IN ~~LOC~~ HOSPITAL LAB): SARS Coronavirus 2: NEGATIVE

## 2018-10-01 MED ORDER — LABETALOL HCL 5 MG/ML IV SOLN
20.0000 mg | Freq: Once | INTRAVENOUS | Status: AC
  Administered 2018-10-01: 20 mg via INTRAVENOUS
  Filled 2018-10-01: qty 4

## 2018-10-01 MED ORDER — AZTREONAM 1 G IJ SOLR
1.0000 g | Freq: Three times a day (TID) | INTRAMUSCULAR | Status: DC
  Filled 2018-10-01 (×6): qty 1

## 2018-10-01 MED ORDER — ASPIRIN 325 MG PO TABS
ORAL_TABLET | ORAL | Status: AC
  Administered 2018-10-01: 325 mg via ORAL
  Filled 2018-10-01: qty 1

## 2018-10-01 MED ORDER — ASPIRIN 325 MG PO TABS
325.0000 mg | ORAL_TABLET | Freq: Once | ORAL | Status: AC
  Administered 2018-10-01: 23:00:00 325 mg via ORAL

## 2018-10-01 MED ORDER — SODIUM CHLORIDE 0.9 % IV SOLN: 1.0000 g | INTRAVENOUS | Status: DC

## 2018-10-01 MED ORDER — ATORVASTATIN CALCIUM 40 MG PO TABS
40.0000 mg | ORAL_TABLET | Freq: Every day | ORAL | Status: DC
  Administered 2018-10-02: 40 mg via ORAL
  Filled 2018-10-01: qty 1

## 2018-10-01 MED ORDER — IOHEXOL 350 MG/ML SOLN
140.0000 mL | Freq: Once | INTRAVENOUS | Status: AC | PRN
  Administered 2018-10-01: 100 mL via INTRAVENOUS

## 2018-10-01 MED ORDER — SODIUM CHLORIDE 0.9% FLUSH: 3.0000 mL | Freq: Once | INTRAVENOUS | Status: DC

## 2018-10-01 NOTE — ED Notes (Signed)
Left message with pt's daughter Maudie Mercury notifying her of ETA of [facility].

## 2018-10-01 NOTE — Consult Note (Addendum)
No new or acute large vessel occlusion on head and neck CTA, not a candidate for mechanical thrombectomy.   /////  [facility] Consult Services   Date of Service:   10/01/2018 20:04:01  Impression:     .  Left Hemispheric Infarct     .  vs seizure  Comments/Sign-Out: Patient with history of hypertension, migraines, COPD, Stroke 10/2017 (wheelchair bound and right sided weakness), seizure 03/2018 which caused transient speech impairment, Atrial Fibrillation is on Eliquis. Presents with speech impairment, right facial droop, right hemianopia. Head CT: No acute Intracranial abnormality. NIHSS 25 (at least 8 points chronic deficits) Symptoms/presentation is suggestive of left hemispheric Acute Ischemic Stroke with Large Vessel Occlusion. Is on Eliquis therefore not a candidate for IV tPA. Will do STAT head and neck CTA to investigate for Large Vessel Occlusion.  Mechanism of Stroke: Possible Cardioembolic  Metrics: Last Known Well: 10/01/2018 17:00:00 [facility] Notification Time: 10/01/2018 20:04:01 Arrival Time: 10/01/2018 18:43:00 Stamp Time: 10/01/2018 20:04:01 Time First Login Attempt: 10/01/2018 20:09:27 Video Start Time: 10/01/2018 20:09:27  Symptoms: speech impairment NIHSS Start Assessment Time: 10/01/2018 20:16:12 Patient is not a candidate for tPA. Patient was not deemed candidate for tPA thrombolytics because of Use of NOAs within 48 hours. Video End Time: 10/01/2018 20:24:15  CT head showed no acute hemorrhage or acute core infarct. CT head was reviewed.  Clinical Presentation is Suggestive of Large Vessel Occlusive Disease, Recommendations are as Follows  CTA Head and Neck. CT Perfusion.   Our recommendations are outlined below.  Recommendations:     .  Activate Stroke Protocol Admission/Order Set     .  Stroke/Telemetry Floor     .  Neuro Checks     .  Bedside Swallow Eval     .  DVT Prophylaxis     .  IV Fluids,  Normal Saline     .  Head of Bed 30 Degrees     .  Euglycemia and Avoid Hyperthermia (PRN Acetaminophen)     .  (General approach to antithrombotic therapy for acute ischemic stroke (suspected or proven) in patient on long-term anticoagulation (appropriately) prior to stroke onset)     .  Will hold anticoagulation and will start aspirin for now ( if no other contraindication exists) till follow up head imaging result is available within 24 hours.     .  If follow up head imaging (preferably a brain MRI) shows no acute stroke and symptoms resolve then stop aspirin and resume Anticoagulation.     .  If imaging shows small ischemic stroke without any hemorrhage (or if MRI is negative for Acute Ischemic Stroke but there is still strong clinical suspicion for small acute ischemic stroke) then can stop aspirin and resume anticoagulation in 24-48 hours after stroke symptom onset if patient is stable.     .  If imaging shows large stroke (but no hemorrhagic conversion) then will continue aspirin and keep holding anticoagulation (Warfarin for at least 10 days and NOAC agents for 14 days (from onset of stroke)).     .  prior to initiating anticoagulation, a noncontrast head CT should be obtained to evaluate for hemorrhagic conversion. once anticoagulation started then aspirin should be stopped.     .  if no Large Vessel Occlusion on CTA head and neck can try ativan 2mg  IVx1 to break a potential Nonconvulsive Status Epilepticus  Routine Consultation with [facility] for Follow up Care  Sign Out:     .  Discussed  with Emergency Department Provider    ------------------------------------------------------------------------------  History of Present Illness: Patient is a 73 year old Female.  Patient was brought by private transportation with symptoms of speech impairment  Patient with history of hypertension, migraines, COPD, Stroke 10/2017 (wheelchair bound and right sided weakness), seizure  03/2018 which caused transient speech impairment, Atrial Fibrillation is on Eliquis. last known well per son: at about 13:00 started to have mild headache which got severe at about 16:00 and then developed speech impairment at 17:00 Anticoagulation or Antiplatelet use: Eliquis Premorbid Level of function: wheelchair bound, intact speech  There is history of Recent Anticoagulants.  Examination: 1A: Level of Consciousness - Alert; keenly responsive + 0 1B: Ask Month and Age - Aphasic + 2 1C: Blink Eyes & Squeeze Hands - Performs 0 Tasks + 2 2: Test Horizontal Extraocular Movements - Partial Gaze Palsy: Can Be Overcome + 1 3: Test Visual Fields - Complete Hemianopia + 2 4: Test Facial Palsy (Use Grimace if Obtunded) - Partial paralysis (lower face) + 2 5A: Test Left Arm Motor Drift - No Drift for 10 Seconds + 0 5B: Test Right Arm Motor Drift - No Movement + 4 6A: Test Left Leg Motor Drift - No Effort Against Gravity + 3 6B: Test Right Leg Motor Drift - No Movement + 4 7: Test Limb Ataxia (FNF/Heel-Shin) - No Ataxia + 0 8: Test Sensation - Normal; No sensory loss + 0 9: Test Language/Aphasia - Mute/Global Aphasia: No Usable Speech/Auditory Comprehension + 3 10: Test Dysarthria - Mute/Anarthric + 2 11: Test Extinction/Inattention - No abnormality + 0  NIHSS Score: 25   Due to the immediate potential for life-threatening deterioration due to underlying acute neurologic illness, I spent 15 minutes providing critical care. This time includes time for face to face visit via telemedicine, review of medical records, imaging studies and discussion of findings with providers, the patient and/or family.   Dr Elenor Quinones   [facility] [phone]   Case [id number]

## 2018-10-01 NOTE — ED Triage Notes (Addendum)
Per son pt began to be "non coherent around 3 hours ago." Son also stating that pt has old right sided weakness from stroke 1 year ago. No new weakness noted per son. Pt also C/O gradual headache that began around 1300 today. Pt normally AxO.

## 2018-10-01 NOTE — H&P (Signed)
History and Physical    Holly Flores:[id number] DOB: [birth year] DOA: 10/01/2018  PCP: Octavio Graves, DO (Inactive)   Patient coming from: Home  I have personally briefly reviewed patient's old medical records in [place]  Chief Complaint: Incoherent speech  HPI: Holly Flores is a 73 y.o. 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  Patient's son is present at bedside and helps with the history, patient is able to tell me her first name, is drowsy, and unable to give me history. Patient's son reports that patient's words were not making sense.  That this was a drastic change from patient's baseline.  He denies noting any facial asymmetry. At baseline patient ambulates with a wheelchair due to right-sided hemiparesis from prior stroke.  He denies significant memory problems.  She is on Eliquis and compliant.  Since onset of symptoms, patient has been drowsy/lethargic.  ED Course: Systolic up to 350/093.  Unremarkable CBC, creatinine about baseline at 1.3.  CT negative for acute abnormality.  Cerebral perfusion study-suggestive small infarct in left centrum semiovale without significant penumbra, CTA head and neck done-no emergent large vessel occlusion.  Telemetry neurology was consulted, notes right facial droop and left hemianopia, impression-left hemispheric infarct versus seizure recommended stroke work-up.  20 mg labetalol given in the ED for elevated blood pressure.  Review of Systems: Unable to assess fully due to patient's incoherent speech, and drowsiness.  Past Medical History:  Diagnosis Date   Anxiety    Arthritis    "all over" (12/20/2017)   Central retinal vein occlusion of left eye 01/22/2015   CHF (congestive heart failure) ([place])    Chronic  bronchitis ([place])    "get it most years" (12/20/2017)   Chronic lower back pain    CKD (chronic kidney disease), stage II    COPD (chronic obstructive pulmonary disease) (HCC)    CVA (cerebral vascular accident) ([place]) 12/30/2014   OCULAR  LEFT  EYE    Depression    Fibromyalgia    GERD (gastroesophageal reflux disease)    Goiter    History of colonic polyps 05/14/2014   Hyperlipidemia    Hypertension    Hypothyroidism    Kidney cysts    left side   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   "affected my right side"   Migraine    "couple times/wk; been having them for a long time" (12/20/2017)   Myocardial infarction [facility])    "one dr said I did; one said I didn't;  don't remember when it was" (12/20/2017)   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   On home oxygen therapy    "3L; sleep w/it q night" (12/20/2017)   Pneumonia    "several times" (12/20/2017)   Shortness of breath    Sleep apnea    "stopped mask when I got on the oxygen" (12/20/2017)   Stroke [facility])     Past Surgical History:  Procedure Laterality Date   ANTERIOR CERVICAL DECOMP/DISCECTOMY FUSION     BACK SURGERY     BREAST BIOPSY     left-non cancerous   CARDIAC CATHETERIZATION     CARPAL TUNNEL RELEASE Left    CATARACT EXTRACTION W/PHACO  09/21/2011   Procedure: CATARACT EXTRACTION PHACO AND INTRAOCULAR LENS PLACEMENT ([place]);  Surgeon: Williams Che, MD;  Location: [facility]  ORS;  Service: Ophthalmology;  Laterality: Left;  CDE:9.78   CATARACT EXTRACTION W/PHACO Right 10/15/2014   Procedure: CATARACT EXTRACTION PHACO AND INTRAOCULAR LENS PLACEMENT ([facility]);  Surgeon: Williams Che, MD;  Location: [facility] ORS;  Service: Ophthalmology;  Laterality: Right;  CDE:4.19   [place]; 1972   CHOLECYSTECTOMY OPEN     COLONOSCOPY  2006   RMR: 1. Internal hemorroids, otherwise normal rectum. 2. Pedunculated polyp at 35 cm. reomved with snare. The remainder of teh colonic mucosa appeared normal.     COLONOSCOPY  2011   Dr. Gala Romney: multiple ascending colon polyps and rectal polyp, adenomatous   COLONOSCOPY N/A 05/31/2014   Procedure: COLONOSCOPY;  Surgeon: Daneil Dolin, MD;  Location: [facility] ENDO SUITE;  Service: Endoscopy;  Laterality: N/A;  130   COLONOSCOPY WITH PROPOFOL N/A 06/24/2017   Procedure: COLONOSCOPY WITH PROPOFOL;  Surgeon: Daneil Dolin, MD;  Location: [facility] ENDO SUITE;  Service: Endoscopy;  Laterality: N/A;  2:15pm   ESOPHAGOGASTRODUODENOSCOPY  2006   Dr. Gala Romney: Subtle Schatzkis ring, otherwise normal upper GI tract, aside from a small pyloric channell erosion, status post dilation as described above.    FLEXIBLE SIGMOIDOSCOPY N/A 11/11/2017   Procedure: FLEXIBLE SIGMOIDOSCOPY;  Surgeon: Otis Brace, MD;  Location: [facility];  Service: Gastroenterology;  Laterality: N/A;   JOINT REPLACEMENT     LUMBAR DISC SURGERY  X 2   POLYPECTOMY  06/24/2017   Procedure: POLYPECTOMY;  Surgeon: Daneil Dolin, MD;  Location: [facility] ENDO SUITE;  Service: Endoscopy;;  ascending   POSTERIOR LUMBAR FUSION  ? date 1st fusion ; 07/2006   previous L4-5 Ray threaded fusion cage; Exploration of L4-5 fusion & PLIF 07/22/2006/notes 07/22/2006   TONSILLECTOMY     TOTAL KNEE ARTHROPLASTY Right 05/24/2013   Procedure: TOTAL KNEE ARTHROPLASTY;  Surgeon: [name], MD;  Location: [facility];  Service: Orthopedics;  Laterality: Right;   TOTAL KNEE ARTHROPLASTY Left    VAGINAL HYSTERECTOMY       reports that she quit smoking about 31 years ago. Her smoking use included cigarettes. She has a 5.00 pack-year smoking history. She has never used smokeless tobacco. She reports previous alcohol use. She reports that she does not use drugs.  Allergies  Allergen Reactions   Penicillins Hives, Itching and Rash    Has patient had a PCN reaction causing immediate rash, facial/tongue/throat swelling, SOB or lightheadedness with hypotension: Yes Has patient had a PCN reaction causing severe rash involving  mucus membranes or skin necrosis: Yes Has patient had a PCN reaction that required hospitalization: No Has patient had a PCN reaction occurring within the last 10 years: No If all of the above answers are "NO", then may proceed with Cephalosporin use.    Sulfa Antibiotics Swelling    Tongue swelling    Family History  Problem Relation Age of Onset   Cancer Other    Diabetes Other    Heart disease Other    Hypertension Other    Asthma Other    Alcoholism Other    Thyroid disease Other    Rheumatologic disease Other    Stroke Other    Hypercholesterolemia Other    Colon cancer Neg Hx     Prior to Admission medications   Medication Sig Start Date End Date Taking? Authorizing Provider  albuterol (PROVENTIL HFA;VENTOLIN HFA) 108 (90 Base) MCG/ACT inhaler Inhale 2 puffs into the lungs every 6 (six) hours as needed (for wheezing/shortness of breath). Patient taking differently: Inhale 2  puffs into the lungs every 4 (four) hours as needed (for wheezing/shortness of breath).  12/08/17   Angiulli, Lavon Paganini, PA-C  ALPRAZolam Duanne Moron) 0.5 MG tablet Take 1 tablet (0.5 mg total) by mouth at bedtime. 12/22/17   Rehman, Areeg N, DO  apixaban (ELIQUIS) 5 MG TABS tablet Take 1 tablet (5 mg total) by mouth 2 (two) times daily. 12/08/17   Angiulli, Lavon Paganini, PA-C  cyclobenzaprine (FLEXERIL) 5 MG tablet Take 1 tablet (5 mg total) by mouth at bedtime. 12/08/17   Angiulli, Lavon Paganini, PA-C  esomeprazole (NEXIUM) 40 MG capsule Take 40 mg by mouth daily at 12 noon.    [provider]  levothyroxine (LEVOTHROID) 50 MCG tablet Take 1 tablet (50 mcg total) by mouth daily before breakfast. 12/08/17   Angiulli, Lavon Paganini, PA-C  metoprolol succinate (TOPROL-XL) 25 MG 24 hr tablet Take 25 mg by mouth 2 (two) times a day.     [provider]  naproxen (NAPROSYN) 500 MG tablet Take 500 mg by mouth 2 (two) times daily as needed for mild pain or moderate pain (with food/milk).    [provider]  potassium chloride SA (K-DUR,KLOR-CON) 20 MEQ tablet Take 1 tablet (20 mEq total) by mouth daily. Patient taking differently: Take 20 mEq by mouth 2 (two) times daily.  12/08/17   Angiulli, Lavon Paganini, PA-C  topiramate (TOPAMAX) 25 MG tablet Take 1 tablet (25 mg total) by mouth 2 (two) times daily. 12/08/17   Angiulli, Lavon Paganini, PA-C  torsemide (DEMADEX) 20 MG tablet Take 40 mg by mouth daily.    [provider]  Vitamin D, Ergocalciferol, (DRISDOL) 1.25 MG (50000 UT) CAPS capsule Take 50,000 Units by mouth 2 (two) times a week.    [provider]    Physical Exam: Exam limited by patient's drowsiness/lethargy Vitals:   10/01/18 2100 10/01/18 2115 10/01/18 2130 10/01/18 2145  BP: (!) 195/97 (!) 175/91 (!) 168/101 (!) 169/98  Pulse: 64 69 73 71  Resp: 10 18 15 12   Temp:      TempSrc:      SpO2: 98% 99% 97% 96%  Weight:      Height:        Constitutional: Drowsy/lethargic, otherwise calm comfortable Vitals:   10/01/18 2100 10/01/18 2115 10/01/18 2130 10/01/18 2145  BP: (!) 195/97 (!) 175/91 (!) 168/101 (!) 169/98  Pulse: 64 69 73 71  Resp: 10 18 15 12   Temp:      TempSrc:      SpO2: 98% 99% 97% 96%  Weight:      Height:       Eyes: PERRL, lids and conjunctivae normal ENMT: Mucous membranes are moist. Posterior pharynx clear of any exudate or lesions.  Neck: normal, supple, no masses, no thyromegaly Respiratory: clear to auscultation bilaterally, no wheezing, no crackles. Normal respiratory effort. No accessory muscle use.  Cardiovascular: Regular rate and rhythm, no murmurs / rubs / gallops.  2+ pedal pulses. Abdomen: no tenderness, no masses palpated. No hepatosplenomegaly. Bowel sounds positive.  Musculoskeletal: no clubbing / cyanosis. No joint deformity upper and lower extremities. Good ROM, no contractures. Normal muscle tone.  Skin: no rashes, lesions, ulcers. No induration Neurologic: unable to fully assess due to patient's drowsiness.   Patient is moving left upper and lower extremities spontaneously, but unable to objectively assess strength, or sensation, not cooperating further with directions.  Not moving right sided upper or lower extremities.  Able to tell me her name Alexandrina, but answers  mostly no to all subsequent questions, unable to tell me her son's name.  Slight right facial droop.  Psychiatric: Drowsy  Labs on Admission: I have personally reviewed following labs and imaging studies  CBC: Recent Labs  Lab 10/01/18 1954 10/01/18 2012  WBC 8.8  --   NEUTROABS 3.0  --   HGB 13.0 13.9  HCT 41.0 41.0  MCV 100.0  --   PLT 200  --    Basic Metabolic Panel: Recent Labs  Lab 10/01/18 1954 10/01/18 2012  NA 140 143  K 4.2 4.1  CL 106 104  CO2 28  --   GLUCOSE 110* 103*  BUN 23 22  CREATININE 1.30* 1.40*  CALCIUM 9.1  --    Liver Function Tests: Recent Labs  Lab 10/01/18 1954  AST 11*  ALT 9  ALKPHOS 72  BILITOT 0.5  PROT 7.5  ALBUMIN 3.9   Coagulation Profile: Recent Labs  Lab 10/01/18 1954  INR 1.5*   CBG: Recent Labs  Lab 10/01/18 1953  GLUCAP 107*   Urine analysis:    Component Value Date/Time   COLORURINE YELLOW 11/04/2017 0357   APPEARANCEUR TURBID (A) 11/04/2017 0357   LABSPEC 1.023 11/04/2017 0357   PHURINE 5.0 11/04/2017 0357   GLUCOSEU NEGATIVE 11/04/2017 0357   HGBUR SMALL (A) 11/04/2017 [place] 11/04/2017 [place] 11/04/2017 0357   PROTEINUR NEGATIVE 11/04/2017 0357   UROBILINOGEN 0.2 12/29/2014 2314   NITRITE NEGATIVE 11/04/2017 0357   LEUKOCYTESUR NEGATIVE 11/04/2017 0357    Radiological Exams on Admission: Ct Code Stroke Cta Head W/wo Contrast  Result Date: 10/01/2018 CLINICAL DATA:  Speech impairment, right facial droop, and right hemianopia. Prior stroke. EXAM: CT ANGIOGRAPHY HEAD AND NECK CT PERFUSION BRAIN TECHNIQUE: Multidetector CT imaging of the head and neck was performed using the standard protocol during bolus  administration of intravenous contrast. Multiplanar CT image reconstructions and MIPs were obtained to evaluate the vascular anatomy. Carotid stenosis measurements (when applicable) are obtained utilizing NASCET criteria, using the distal internal carotid diameter as the denominator. Multiphase CT imaging of the brain was performed following IV bolus contrast injection. Subsequent parametric perfusion maps were calculated using RAPID software. CONTRAST:  184mL OMNIPAQUE IOHEXOL 350 MG/ML SOLN COMPARISON:  Head MRA 12/19/2017.  Head and neck CTA 10/29/2017. FINDINGS: CTA NECK FINDINGS Aortic arch: Normal variant aortic arch branching with common origin of the brachiocephalic and left common carotid arteries. Mild arch atherosclerosis. Widely patent arch vessel origins. Right carotid system: Patent with mild calcified plaque in the proximal ICA. No evidence of dissection or stenosis. Left carotid system: Patent with minimal plaque in the proximal ICA. No evidence of dissection or stenosis. Vertebral arteries: Patent without evidence of dissection or stenosis. Moderately dominant left vertebral artery. Skeleton: C3-C6 ACDF. Other neck: Diffuse heterogeneity and enlargement of the left greater than right thyroid lobes with a suspected approximately 4 cm nodule inferiorly in the left lobe extending into the superior mediastinum, similar to the prior CTA. Upper chest: Mild mosaic attenuation without apically lung consolidation or mass. Partially visualized small pleural effusions. Review of the MIP images confirms the above findings CTA HEAD FINDINGS Anterior circulation: The internal carotid arteries are patent from skull base to carotid termini with mild nonstenotic plaque bilaterally. 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 ACAs and MCAs are patent without evidence of proximal branch occlusion or significant proximal stenosis. The right A1 segment  is  hypoplastic. Posterior circulation: The intracranial vertebral arteries are widely patent to the basilar. A patent left PICA, right larger than left AICAs, and bilateral SCAs are identified. 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 Posterior communicating arteries are small or absent. Both PCAs are patent without evidence of significant proximal stenosis. Venous sinuses: Patent. Anatomic variants: Hypoplastic right A1. Review of the MIP images confirms the above findings CT Brain Perfusion Findings: ASPECTS: 10 CBF (<30%) Volume: 92mL Perfusion (Tmax>6.0s) volume: 34mL although much of this reflects likely artifact in the posterior fossa Mismatch Volume: 11 mLmL though with the above caveat and no evidence of significant penumbra Infarction Location: Left frontal white matter in the centrum semiovale IMPRESSION: 1. No emergent large vessel occlusion. 2. Mild atherosclerosis in the head and neck without significant stenosis. 3. CT perfusion imaging suggests a small infarct in the left centrum semiovale without significant penumbra. 4. 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 5.  Aortic Atherosclerosis (ICD10-I70.0). Electronically Signed   By: Logan Bores M.D.   On: 10/01/2018 21:09   Ct Code Stroke Cta Neck W/wo Contrast  Result Date: 10/01/2018 CLINICAL DATA:  Speech impairment, right facial droop, and right hemianopia. Prior stroke. EXAM: CT ANGIOGRAPHY HEAD AND NECK CT PERFUSION BRAIN TECHNIQUE: Multidetector CT imaging of the head and neck was performed using the standard protocol during bolus administration of intravenous contrast. Multiplanar CT image reconstructions and MIPs were obtained to evaluate the vascular anatomy. Carotid stenosis measurements (when applicable) are obtained utilizing NASCET criteria, using the distal internal carotid diameter as the denominator. Multiphase CT imaging of the brain was  performed following IV bolus contrast injection. Subsequent parametric perfusion maps were calculated using RAPID software. CONTRAST:  1109mL OMNIPAQUE IOHEXOL 350 MG/ML SOLN COMPARISON:  Head MRA 12/19/2017.  Head and neck CTA 10/29/2017. FINDINGS: CTA NECK FINDINGS Aortic arch: Normal variant aortic arch branching with common origin of the brachiocephalic and left common carotid arteries. Mild arch atherosclerosis. Widely patent arch vessel origins. Right carotid system: Patent with mild calcified plaque in the proximal ICA. No evidence of dissection or stenosis. Left carotid system: Patent with minimal plaque in the proximal ICA. No evidence of dissection or stenosis. Vertebral arteries: Patent without evidence of dissection or stenosis. Moderately dominant left vertebral artery. Skeleton: C3-C6 ACDF. Other neck: Diffuse heterogeneity and enlargement of the left greater than right thyroid lobes with a suspected approximately 4 cm nodule inferiorly in the left lobe extending into the superior mediastinum, similar to the prior CTA. Upper chest: Mild mosaic attenuation without apically lung consolidation or mass. Partially visualized small pleural effusions. Review of the MIP images confirms the above findings CTA HEAD FINDINGS Anterior circulation: The internal carotid arteries are patent from skull base to carotid termini with mild nonstenotic plaque bilaterally. 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 ACAs and MCAs are patent without evidence of proximal branch occlusion or significant proximal stenosis. The right A1 segment is hypoplastic. Posterior circulation: The intracranial vertebral arteries are widely patent to the basilar. A patent left PICA, right larger than left AICAs, and bilateral SCAs are identified. 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 Posterior communicating arteries are small  or absent. Both PCAs are patent without evidence of significant proximal stenosis. Venous sinuses: Patent. Anatomic variants:  Hypoplastic right A1. Review of the MIP images confirms the above findings CT Brain Perfusion Findings: ASPECTS: 10 CBF (<30%) Volume: 89mL Perfusion (Tmax>6.0s) volume: 4mL although much of this reflects likely artifact in the posterior fossa Mismatch Volume: 11 mLmL though with the above caveat and no evidence of significant penumbra Infarction Location: Left frontal white matter in the centrum semiovale IMPRESSION: 1. No emergent large vessel occlusion. 2. Mild atherosclerosis in the head and neck without significant stenosis. 3. CT perfusion imaging suggests a small infarct in the left centrum semiovale without significant penumbra. 4. 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 5.  Aortic Atherosclerosis (ICD10-I70.0). Electronically Signed   By: Logan Bores M.D.   On: 10/01/2018 21:09   Ct Code Stroke Cta Cerebral Perfusion W/wo Contrast  Result Date: 10/01/2018 CLINICAL DATA:  Speech impairment, right facial droop, and right hemianopia. Prior stroke. EXAM: CT ANGIOGRAPHY HEAD AND NECK CT PERFUSION BRAIN TECHNIQUE: Multidetector CT imaging of the head and neck was performed using the standard protocol during bolus administration of intravenous contrast. Multiplanar CT image reconstructions and MIPs were obtained to evaluate the vascular anatomy. Carotid stenosis measurements (when applicable) are obtained utilizing NASCET criteria, using the distal internal carotid diameter as the denominator. Multiphase CT imaging of the brain was performed following IV bolus contrast injection. Subsequent parametric perfusion maps were calculated using RAPID software. CONTRAST:  133mL OMNIPAQUE IOHEXOL 350 MG/ML SOLN COMPARISON:  Head MRA 12/19/2017.  Head and neck CTA 10/29/2017. FINDINGS: CTA NECK FINDINGS Aortic arch: Normal variant aortic arch  branching with common origin of the brachiocephalic and left common carotid arteries. Mild arch atherosclerosis. Widely patent arch vessel origins. Right carotid system: Patent with mild calcified plaque in the proximal ICA. No evidence of dissection or stenosis. Left carotid system: Patent with minimal plaque in the proximal ICA. No evidence of dissection or stenosis. Vertebral arteries: Patent without evidence of dissection or stenosis. Moderately dominant left vertebral artery. Skeleton: C3-C6 ACDF. Other neck: Diffuse heterogeneity and enlargement of the left greater than right thyroid lobes with a suspected approximately 4 cm nodule inferiorly in the left lobe extending into the superior mediastinum, similar to the prior CTA. Upper chest: Mild mosaic attenuation without apically lung consolidation or mass. Partially visualized small pleural effusions. Review of the MIP images confirms the above findings CTA HEAD FINDINGS Anterior circulation: The internal carotid arteries are patent from skull base to carotid termini with mild nonstenotic plaque bilaterally. 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 ACAs and MCAs are patent without evidence of proximal branch occlusion or significant proximal stenosis. The right A1 segment is hypoplastic. Posterior circulation: The intracranial vertebral arteries are widely patent to the basilar. A patent left PICA, right larger than left AICAs, and bilateral SCAs are identified. 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 Posterior communicating arteries are small or absent. Both PCAs are patent without evidence of significant proximal stenosis. Venous sinuses: Patent. Anatomic variants: Hypoplastic right A1. Review of the MIP images confirms the above findings CT Brain Perfusion Findings: ASPECTS: 10 CBF (<30%) Volume: 66mL Perfusion (Tmax>6.0s) volume: 16mL although  much of this reflects likely artifact in the posterior fossa Mismatch Volume: 11 mLmL though with the above caveat and no evidence of significant penumbra Infarction Location: Left frontal white matter in the centrum semiovale IMPRESSION: 1. No emergent large  vessel occlusion. 2. Mild atherosclerosis in the head and neck without significant stenosis. 3. CT perfusion imaging suggests a small infarct in the left centrum semiovale without significant penumbra. 4. 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 5.  Aortic Atherosclerosis (ICD10-I70.0). Electronically Signed   By: Logan Bores M.D.   On: 10/01/2018 21:09   Ct Head Code Stroke Wo Contrast  Result Date: 10/01/2018 CLINICAL DATA:  Code stroke. Altered level of consciousness. Headache. History of right-sided weakness from an old stroke. EXAM: CT HEAD WITHOUT CONTRAST TECHNIQUE: Contiguous axial images were obtained from the base of the skull through the vertex without intravenous contrast. COMPARISON:  09/05/2018 FINDINGS: Brain: There is no evidence of acute infarct, intracranial hemorrhage, mass, midline shift, or extra-axial fluid collection. There is mild cerebral atrophy. A chronic infarct is again noted involving the left corona radiata and deep gray nuclei. Confluent hypodensities in the cerebral white matter bilaterally are unchanged and nonspecific but compatible with severe chronic small vessel ischemic disease. Vascular: Calcified atherosclerosis at the skull base. No hyperdense vessel. Skull: No fracture or focal osseous lesion. Sinuses/Orbits: Visualized paranasal sinuses and mastoid air cells are clear. Bilateral cataract extraction is noted. Other: None. ASPECTS [facility] Stroke Program Early CT Score) Not scored with this history. IMPRESSION: 1. No evidence of acute intracranial abnormality. 2. Severe chronic small vessel ischemic disease. These results were called by telephone at the time of  interpretation on 10/01/2018 at 8:10 pm to Dr. Roderic Palau, who verbally acknowledged these results. Electronically Signed   By: Logan Bores M.D.   On: 10/01/2018 20:10    EKG: Independently reviewed.  Sinus rhythm, QTC 392.  Nonspecific T wave abnormalities 1 aVL V4 through V6 are old.  No change from prior.  Assessment/Plan Active Problems:   Acute CVA (cerebrovascular accident) ([place])   Incoherent speech-likely due to acute CVA.  History of CVA with residual right-sided hemiparesis 1 year ago.  CT perfusion study suggests small infarct in the left centrum semiovale without significant penumbra.  Telemetry neurology consulted, notes right facial droop and right hemianopia, Impression-left hemispheric infarct versus seizure, recommended stroke work-up, with recommendations as per anticoagulation and antiplatelets pending further imaging. -Echocardiogram -MRI brain -Deferred carotid Dopplers, CTA neck done - Aspirin 325 x 1 for now. -Atorvastatin 40 mg daily -Lipid Panel, HgbA1c a.m. -Bedside swallow evaluation-patient passed -PT OT speech therapy evaluation - Please consult neurology in a.m. - Patient on Eliquis-last dose today at about 4 PM today, anticoagulation and further aspirin therapy pending neurology evaluation. -Allow for permissive hypertension, in the setting of acute CVA -PRN hydralazine for systolic greater than 601  Metabolic encephalopathy-patient drowsy/lethargic as onset of slurred speech.  Likely due to intracranial event.  No respiratory symptoms.  UA shows many bacteria, 0-5 WBCs.  Afebrile.  WBC 8.8. - Obtain urine culture - IV Aztreonam ( penicillin allergy, avoiding quinolones with possible seizure)  Paroxysmal atrial fibrillation-rate controlled and anticoagulated.  Currently in sinus rhythm. -Hold Eliquis for now pending neurology evaluation -Hold metoprolol  Hypertension-elevated. -Allow for permissive hypertension, hold home metoprolol and  torsemide.  Diastolic CHF- stable and compensated.  Last echo 10/2017 EF 60 to 65%, determinate diastolic function. -Will hold torsemide for now.  CKD 3-creatinine 1.3, about baseline. -Hold torsemide  DVT prophylaxis: SCDs for now pending resumption of anticoagulation Code Status: Full Family Communication: Son at bedside. Disposition Plan: Per rounding team Consults called: Please consult neurology in a.m. Admission status: Inpatient, telemetry  I certify that at the point of admission it is my clinical judgment that the patient will require inpatient hospital care spanning beyond 2 midnights from the point of admission due to high intensity of service, high risk for further deterioration and high frequency of surveillance required. The following factors support the patient status of inpatient: Acute CVA, as suggested on cerebral perfusion imaging.   Bethena Roys MD [facility]  10/01/2018, 11:00 PM

## 2018-10-01 NOTE — ED Provider Notes (Signed)
[facility] EMERGENCY DEPARTMENT Provider Note   CSN: [id number] Arrival date & time: 10/01/18  1843  An emergency department physician performed an initial assessment on this suspected stroke patient at 34.  History   Chief Complaint Chief Complaint  Patient presents with   Code Stroke    HPI Holly Flores is a 73 y.o. female presenting for evaluation of altered mental status.  Level 5 caveat due to altered mental status.  History provided by son.  Son states patient started to complain of a mild headache around 1 PM this afternoon.  At 4 PM, her headache became more severe.  At that time, she started to have speech difficulty.  She normally has some mild slurring, but as of 4 PM, she could not respond appropriately to questions.  As such, patient was brought to the emergency room.  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  He has a history of A. fib for which she is on medication including anticoagulation (Eliquis).  History of hypertension for which he takes medication, has had her medicine today.  Additional history obtained from chart review.  Per chart review, patient speech is often slurred, but she is responding appropriately to questions.  Chronic right-sided deficits.     HPI  Past Medical History:  Diagnosis Date   Anxiety    Arthritis    "all over" (12/20/2017)   Central retinal vein occlusion of left eye 01/22/2015   CHF (congestive heart failure) ([place])    Chronic bronchitis ([place])    "get it most years" (12/20/2017)   Chronic lower back pain    CKD (chronic kidney disease), stage II    COPD (chronic obstructive pulmonary disease) (HCC)    CVA (cerebral vascular accident) ([place]) 12/30/2014   OCULAR  LEFT  EYE    Depression    Fibromyalgia    GERD (gastroesophageal reflux disease)    Goiter    History of colonic polyps 05/14/2014   Hyperlipidemia    Hypertension    Hypothyroidism    Kidney cysts     left side   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   "affected my right side"   Migraine    "couple times/wk; been having them for a long time" (12/20/2017)   Myocardial infarction [facility]    "one dr said I did; one said I didn't;  don't remember when it was" (12/20/2017)   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   On home oxygen therapy    "3L; sleep w/it q night" (12/20/2017)   Pneumonia    "several times" (12/20/2017)   Shortness of breath    Sleep apnea    "stopped mask when I got on the oxygen" (12/20/2017)   Stroke [facility])     Patient Active Problem List   Diagnosis Date Noted   Physical deconditioning 12/19/2017   Chronic kidney disease (CKD), stage III (moderate) (HCC)    Hypoalbuminemia due to protein-calorie malnutrition ([place])    Intractable migraine without status migrainosus    Reactive depression    Hemiparesis affecting right side as late effect of stroke (HCC)    Slow transit constipation    PAF (paroxysmal atrial fibrillation) ([place])    Dysphasia, post-stroke    COPD (chronic obstructive pulmonary disease) ([place]) 10/29/2017   Chronic diastolic CHF (congestive heart failure) ([place]) 10/29/2017   Hypothyroidism 10/29/2017   Essential hypertension 10/29/2017   GERD (gastroesophageal reflux disease) 10/29/2017   Anxiety 10/29/2017   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   Hyperlipidemia 04/20/2013    Past Surgical History:  Procedure Laterality Date   ANTERIOR CERVICAL DECOMP/DISCECTOMY FUSION     BACK SURGERY     BREAST BIOPSY     left-non cancerous   CARDIAC CATHETERIZATION     CARPAL TUNNEL RELEASE Left    CATARACT EXTRACTION W/PHACO  09/21/2011   Procedure: CATARACT EXTRACTION PHACO AND INTRAOCULAR LENS PLACEMENT (IOC);  Surgeon: Williams Che, MD;  Location: [facility];  Service: Ophthalmology;  Laterality: Left;  CDE:9.78   CATARACT EXTRACTION W/PHACO Right 10/15/2014   Procedure: CATARACT EXTRACTION PHACO AND  INTRAOCULAR LENS PLACEMENT (IOC);  Surgeon: Williams Che, MD;  Location: [facility];  Service: Ophthalmology;  Laterality: Right;  CDE:4.19   [place]; 1972   CHOLECYSTECTOMY OPEN     COLONOSCOPY  2006   RMR: 1. Internal hemorroids, otherwise normal rectum. 2. Pedunculated polyp at 35 cm. reomved with snare. The remainder of teh colonic mucosa appeared normal.    COLONOSCOPY  2011   Dr. Gala Romney: multiple ascending colon polyps and rectal polyp, adenomatous   COLONOSCOPY N/A 05/31/2014   Procedure: COLONOSCOPY;  Surgeon: Daneil Dolin, MD;  Location: [facility];  Service: Endoscopy;  Laterality: N/A;  130   COLONOSCOPY WITH PROPOFOL N/A 06/24/2017   Procedure: COLONOSCOPY WITH PROPOFOL;  Surgeon: Daneil Dolin, MD;  Location[facility];  Service: Endoscopy;  Laterality: N/A;  2:15pm   ESOPHAGOGASTRODUODENOSCOPY  2006   Dr. Gala Romney: Subtle Schatzkis ring, otherwise normal upper GI tract, aside from a small pyloric channell erosion, status post dilation as described above.    FLEXIBLE SIGMOIDOSCOPY N/A 11/11/2017   Procedure: FLEXIBLE SIGMOIDOSCOPY;  Surgeon: Otis Brace, MD;  Location: [facility];  Service: Gastroenterology;  Laterality: N/A;   JOINT REPLACEMENT     LUMBAR DISC SURGERY  X 2   POLYPECTOMY  06/24/2017   Procedure: POLYPECTOMY;  Surgeon: Daneil Dolin, MD;  Location: [facility];  Service: Endoscopy;;  ascending   POSTERIOR LUMBAR FUSION  ? date 1st fusion ; 07/2006   previous L4-5 Ray threaded fusion cage; Exploration of L4-5 fusion & PLIF 07/22/2006/notes 07/22/2006   TONSILLECTOMY     TOTAL KNEE ARTHROPLASTY Right 05/24/2013   Procedure: TOTAL KNEE ARTHROPLASTY;  Surgeon: [name], MD;  Location: [facility];  Service: Orthopedics;  Laterality: Right;   TOTAL KNEE ARTHROPLASTY Left    VAGINAL HYSTERECTOMY       OB History   No obstetric history on file.      Home Medications    Prior to Admission medications   Medication Sig  Start Date End Date Taking? Authorizing Provider  albuterol (PROVENTIL HFA;VENTOLIN HFA) 108 (90 Base) MCG/ACT inhaler Inhale 2 puffs into the lungs every 6 (six) hours as needed (for wheezing/shortness of breath). Patient taking differently: Inhale 2 puffs into the lungs every 4 (four) hours as needed (for wheezing/shortness of breath).  12/08/17   Angiulli, Lavon Paganini, PA-C  ALPRAZolam Duanne Moron) 0.5 MG tablet Take 1 tablet (0.5 mg total) by mouth at bedtime. 12/22/17   Rehman, Areeg N, DO  apixaban (ELIQUIS) 5 MG TABS tablet Take 1 tablet (5 mg total) by mouth 2 (two) times daily. 12/08/17   Angiulli, Lavon Paganini, PA-C  cyclobenzaprine (FLEXERIL) 5 MG tablet Take 1 tablet (5 mg total) by mouth at bedtime. 12/08/17   Angiulli, Lavon Paganini, PA-C  esomeprazole (NEXIUM) 40 MG capsule Take 40 mg by mouth daily at 12  noon.    [provider]  levothyroxine (LEVOTHROID) 50 MCG tablet Take 1 tablet (50 mcg total) by mouth daily before breakfast. 12/08/17   Angiulli, Lavon Paganini, PA-C  metoprolol succinate (TOPROL-XL) 25 MG 24 hr tablet Take 25 mg by mouth 2 (two) times a day.     [provider]  naproxen (NAPROSYN) 500 MG tablet Take 500 mg by mouth 2 (two) times daily as needed for mild pain or moderate pain (with food/milk).    [provider]  potassium chloride SA (K-DUR,KLOR-CON) 20 MEQ tablet Take 1 tablet (20 mEq total) by mouth daily. Patient taking differently: Take 20 mEq by mouth 2 (two) times daily.  12/08/17   Angiulli, Lavon Paganini, PA-C  topiramate (TOPAMAX) 25 MG tablet Take 1 tablet (25 mg total) by mouth 2 (two) times daily. 12/08/17   Angiulli, Lavon Paganini, PA-C  torsemide (DEMADEX) 20 MG tablet Take 40 mg by mouth daily.    [provider]  Vitamin D, Ergocalciferol, (DRISDOL) 1.25 MG (50000 UT) CAPS capsule Take 50,000 Units by mouth 2 (two) times a week.    [provider]    Family History Family History  Problem Relation Age of Onset   Cancer Other     Diabetes Other    Heart disease Other    Hypertension Other    Asthma Other    Alcoholism Other    Thyroid disease Other    Rheumatologic disease Other    Stroke Other    Hypercholesterolemia Other    Colon cancer Neg Hx     Social History Social History   Tobacco Use   Smoking status: Former Smoker    Packs/day: 0.50    Years: 10.00    Pack years: 5.00    Types: Cigarettes    Quit date: 04/21/1987    Years since quitting: 31.4   Smokeless tobacco: Never Used   Tobacco comment: smokes cigarette if she has a headache which helps.  Substance Use Topics   Alcohol use: Not Currently    Comment: previously 2 glasses of wine a day; stopped in 2017 (12/20/2017   Drug use: Never     Allergies   Penicillins and Sulfa antibiotics   Review of Systems Review of Systems  Unable to perform ROS: Acuity of condition  Neurological: Positive for speech difficulty.     Physical Exam Updated Vital Signs BP (!) 169/98    Pulse 71    Temp 98.3 F (36.8 C) (Oral)    Resp 12    Ht 5\' 2"  (1.575 m)    Wt 74.8 kg    SpO2 96%    BMI 30.18 kg/m   Physical Exam Vitals signs and nursing note reviewed.  Constitutional:      Appearance: She is well-developed.     Comments: Elderly female who is unable to respond to questions, but follows commands.  HENT:     Head: Normocephalic and atraumatic.  Eyes:     Comments: Unable to track finger to the right.  Able to follow finger to the left  Neck:     Musculoskeletal: Normal range of motion and neck supple.  Cardiovascular:     Rate and Rhythm: Normal rate and regular rhythm.     Pulses: Normal pulses.  Pulmonary:     Effort: Pulmonary effort is normal. No respiratory distress.     Breath sounds: Normal breath sounds. No wheezing.  Abdominal:     General: There is no distension.  Palpations: Abdomen is soft. There is no mass.     Tenderness: There is no abdominal tenderness. There is no guarding or rebound.    Musculoskeletal: Normal range of motion.  Skin:    General: Skin is warm and dry.     Capillary Refill: Capillary refill takes less than 2 seconds.  Neurological:     Mental Status: She is alert.     Comments: Able to follow commands. When asked what her name is, pt states no. Pt gives incomprehesible response when asked where she is.  R sided facial droop which son states is new. Previous notes report facial droop. Chronic R sided weakness limiting remainder of neuro exam.       ED Treatments / Results  Labs (all labs ordered are listed, but only abnormal results are displayed) Labs Reviewed  COMPREHENSIVE METABOLIC PANEL - Abnormal; Notable for the following components:      Result Value   Glucose, Bld 110 (*)    Creatinine, Ser 1.30 (*)    AST 11 (*)    GFR calc non Af Amer 41 (*)    GFR calc Af Amer 47 (*)    All other components within normal limits  PROTIME-INR - Abnormal; Notable for the following components:   Prothrombin Time 17.6 (*)    INR 1.5 (*)    All other components within normal limits  APTT - Abnormal; Notable for the following components:   aPTT 40 (*)    All other components within normal limits  RAPID URINE DRUG SCREEN, HOSP PERFORMED - Abnormal; Notable for the following components:   Benzodiazepines POSITIVE (*)    All other components within normal limits  DIFFERENTIAL - Abnormal; Notable for the following components:   Lymphs Abs 4.1 (*)    Eosinophils Absolute 1.1 (*)    All other components within normal limits  CBG MONITORING, ED - Abnormal; Notable for the following components:   Glucose-Capillary 107 (*)    All other components within normal limits  I-STAT CHEM 8, ED - Abnormal; Notable for the following components:   Creatinine, Ser 1.40 (*)    Glucose, Bld 103 (*)    All other components within normal limits  SARS CORONAVIRUS 2 (HOSPITAL ORDER, [facility])  CBC  ETHANOL  URINALYSIS, ROUTINE W REFLEX  MICROSCOPIC    EKG None  Radiology Ct Code Stroke Cta Head W/wo Contrast  Result Date: 10/01/2018 CLINICAL DATA:  Speech impairment, right facial droop, and right hemianopia. Prior stroke. EXAM: CT ANGIOGRAPHY HEAD AND NECK CT PERFUSION BRAIN TECHNIQUE: Multidetector CT imaging of the head and neck was performed using the standard protocol during bolus administration of intravenous contrast. Multiplanar CT image reconstructions and MIPs were obtained to evaluate the vascular anatomy. Carotid stenosis measurements (when applicable) are obtained utilizing NASCET criteria, using the distal internal carotid diameter as the denominator. Multiphase CT imaging of the brain was performed following IV bolus contrast injection. Subsequent parametric perfusion maps were calculated using RAPID software. CONTRAST:  138mL OMNIPAQUE IOHEXOL 350 MG/ML SOLN COMPARISON:  Head MRA 12/19/2017.  Head and neck CTA 10/29/2017. FINDINGS: CTA NECK FINDINGS Aortic arch: Normal variant aortic arch branching with common origin of the brachiocephalic and left common carotid arteries. Mild arch atherosclerosis. Widely patent arch vessel origins. Right carotid system: Patent with mild calcified plaque in the proximal ICA. No evidence of dissection or stenosis. Left carotid system: Patent with minimal plaque in the proximal ICA. No evidence of dissection or  stenosis. Vertebral arteries: Patent without evidence of dissection or stenosis. Moderately dominant left vertebral artery. Skeleton: C3-C6 ACDF. Other neck: Diffuse heterogeneity and enlargement of the left greater than right thyroid lobes with a suspected approximately 4 cm nodule inferiorly in the left lobe extending into the superior mediastinum, similar to the prior CTA. Upper chest: Mild mosaic attenuation without apically lung consolidation or mass. Partially visualized small pleural effusions. Review of the MIP images confirms the above findings CTA HEAD FINDINGS Anterior  circulation: The internal carotid arteries are patent from skull base to carotid termini with mild nonstenotic plaque bilaterally. 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 ACAs and MCAs are patent without evidence of proximal branch occlusion or significant proximal stenosis. The right A1 segment is hypoplastic. Posterior circulation: The intracranial vertebral arteries are widely patent to the basilar. A patent left PICA, right larger than left AICAs, and bilateral SCAs are identified. 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 Posterior communicating arteries are small or absent. Both PCAs are patent without evidence of significant proximal stenosis. Venous sinuses: Patent. Anatomic variants: Hypoplastic right A1. Review of the MIP images confirms the above findings CT Brain Perfusion Findings: ASPECTS: 10 CBF (<30%) Volume: 58mL Perfusion (Tmax>6.0s) volume: 2mL although much of this reflects likely artifact in the posterior fossa Mismatch Volume: 11 mLmL though with the above caveat and no evidence of significant penumbra Infarction Location: Left frontal white matter in the centrum semiovale IMPRESSION: 1. No emergent large vessel occlusion. 2. Mild atherosclerosis in the head and neck without significant stenosis. 3. CT perfusion imaging suggests a small infarct in the left centrum semiovale without significant penumbra. 4. 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 5.  Aortic Atherosclerosis (ICD10-I70.0). Electronically Signed   By: Logan Bores M.D.   On: 10/01/2018 21:09   Ct Code Stroke Cta Neck W/wo Contrast  Result Date: 10/01/2018 CLINICAL DATA:  Speech impairment, right facial droop, and right hemianopia. Prior stroke. EXAM: CT ANGIOGRAPHY HEAD AND NECK CT PERFUSION BRAIN TECHNIQUE: Multidetector CT imaging of the head and neck was  performed using the standard protocol during bolus administration of intravenous contrast. Multiplanar CT image reconstructions and MIPs were obtained to evaluate the vascular anatomy. Carotid stenosis measurements (when applicable) are obtained utilizing NASCET criteria, using the distal internal carotid diameter as the denominator. Multiphase CT imaging of the brain was performed following IV bolus contrast injection. Subsequent parametric perfusion maps were calculated using RAPID software. CONTRAST:  112mL OMNIPAQUE IOHEXOL 350 MG/ML SOLN COMPARISON:  Head MRA 12/19/2017.  Head and neck CTA 10/29/2017. FINDINGS: CTA NECK FINDINGS Aortic arch: Normal variant aortic arch branching with common origin of the brachiocephalic and left common carotid arteries. Mild arch atherosclerosis. Widely patent arch vessel origins. Right carotid system: Patent with mild calcified plaque in the proximal ICA. No evidence of dissection or stenosis. Left carotid system: Patent with minimal plaque in the proximal ICA. No evidence of dissection or stenosis. Vertebral arteries: Patent without evidence of dissection or stenosis. Moderately dominant left vertebral artery. Skeleton: C3-C6 ACDF. Other neck: Diffuse heterogeneity and enlargement of the left greater than right thyroid lobes with a suspected approximately 4 cm nodule inferiorly in the left lobe extending into the superior mediastinum, similar to the prior CTA. Upper chest: Mild mosaic attenuation without apically lung consolidation or mass. Partially visualized small  pleural effusions. Review of the MIP images confirms the above findings CTA HEAD FINDINGS Anterior circulation: The internal carotid arteries are patent from skull base to carotid termini with mild nonstenotic plaque bilaterally. 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 ACAs and MCAs are patent without evidence of proximal branch occlusion or  significant proximal stenosis. The right A1 segment is hypoplastic. Posterior circulation: The intracranial vertebral arteries are widely patent to the basilar. A patent left PICA, right larger than left AICAs, and bilateral SCAs are identified. 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 Posterior communicating arteries are small or absent. Both PCAs are patent without evidence of significant proximal stenosis. Venous sinuses: Patent. Anatomic variants: Hypoplastic right A1. Review of the MIP images confirms the above findings CT Brain Perfusion Findings: ASPECTS: 10 CBF (<30%) Volume: 21mL Perfusion (Tmax>6.0s) volume: 4mL although much of this reflects likely artifact in the posterior fossa Mismatch Volume: 11 mLmL though with the above caveat and no evidence of significant penumbra Infarction Location: Left frontal white matter in the centrum semiovale IMPRESSION: 1. No emergent large vessel occlusion. 2. Mild atherosclerosis in the head and neck without significant stenosis. 3. CT perfusion imaging suggests a small infarct in the left centrum semiovale without significant penumbra. 4. 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 5.  Aortic Atherosclerosis (ICD10-I70.0). Electronically Signed   By: Logan Bores M.D.   On: 10/01/2018 21:09   Ct Code Stroke Cta Cerebral Perfusion W/wo Contrast  Result Date: 10/01/2018 CLINICAL DATA:  Speech impairment, right facial droop, and right hemianopia. Prior stroke. EXAM: CT ANGIOGRAPHY HEAD AND NECK CT PERFUSION BRAIN TECHNIQUE: Multidetector CT imaging of the head and neck was performed using the standard protocol during bolus administration of intravenous contrast. Multiplanar CT image reconstructions and MIPs were obtained to evaluate the vascular anatomy. Carotid stenosis measurements (when applicable) are obtained utilizing NASCET criteria, using the distal internal carotid  diameter as the denominator. Multiphase CT imaging of the brain was performed following IV bolus contrast injection. Subsequent parametric perfusion maps were calculated using RAPID software. CONTRAST:  180mL OMNIPAQUE IOHEXOL 350 MG/ML SOLN COMPARISON:  Head MRA 12/19/2017.  Head and neck CTA 10/29/2017. FINDINGS: CTA NECK FINDINGS Aortic arch: Normal variant aortic arch branching with common origin of the brachiocephalic and left common carotid arteries. Mild arch atherosclerosis. Widely patent arch vessel origins. Right carotid system: Patent with mild calcified plaque in the proximal ICA. No evidence of dissection or stenosis. Left carotid system: Patent with minimal plaque in the proximal ICA. No evidence of dissection or stenosis. Vertebral arteries: Patent without evidence of dissection or stenosis. Moderately dominant left vertebral artery. Skeleton: C3-C6 ACDF. Other neck: Diffuse heterogeneity and enlargement of the left greater than right thyroid lobes with a suspected approximately 4 cm nodule inferiorly in the left lobe extending into the superior mediastinum, similar to the prior CTA. Upper chest: Mild mosaic attenuation without apically lung consolidation or mass. Partially visualized small pleural effusions. Review of the MIP images confirms the above findings CTA HEAD FINDINGS Anterior circulation: The internal carotid arteries are patent from skull base to carotid termini with mild nonstenotic plaque bilaterally. 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 ACAs and MCAs are patent without evidence of proximal branch occlusion or significant proximal stenosis. The  right A1 segment is hypoplastic. Posterior circulation: The intracranial vertebral arteries are widely patent to the basilar. A patent left PICA, right larger than left AICAs, and bilateral SCAs are identified. 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 Posterior communicating arteries are small or absent. Both PCAs are patent without evidence of significant proximal stenosis. Venous sinuses: Patent. Anatomic variants: Hypoplastic right A1. Review of the MIP images confirms the above findings CT Brain Perfusion Findings: ASPECTS: 10 CBF (<30%) Volume: 72mL Perfusion (Tmax>6.0s) volume: 77mL although much of this reflects likely artifact in the posterior fossa Mismatch Volume: 11 mLmL though with the above caveat and no evidence of significant penumbra Infarction Location: Left frontal white matter in the centrum semiovale IMPRESSION: 1. No emergent large vessel occlusion. 2. Mild atherosclerosis in the head and neck without significant stenosis. 3. CT perfusion imaging suggests a small infarct in the left centrum semiovale without significant penumbra. 4. 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 5.  Aortic Atherosclerosis (ICD10-I70.0). Electronically Signed   By: Logan Bores M.D.   On: 10/01/2018 21:09   Ct Head Code Stroke Wo Contrast  Result Date: 10/01/2018 CLINICAL DATA:  Code stroke. Altered level of consciousness. Headache. History of right-sided weakness from an old stroke. EXAM: CT HEAD WITHOUT CONTRAST TECHNIQUE: Contiguous axial images were obtained from the base of the skull through the vertex without intravenous contrast. COMPARISON:  09/05/2018 FINDINGS: Brain: There is no evidence of acute infarct, intracranial hemorrhage, mass, midline shift, or extra-axial fluid collection. There is mild cerebral atrophy. A chronic infarct is again noted involving the left corona radiata and deep gray nuclei. Confluent hypodensities in the cerebral white matter bilaterally are unchanged and nonspecific but compatible with severe chronic small vessel ischemic disease. Vascular: Calcified atherosclerosis at the skull base. No hyperdense vessel. Skull: No fracture or focal osseous  lesion. Sinuses/Orbits: Visualized paranasal sinuses and mastoid air cells are clear. Bilateral cataract extraction is noted. Other: None. ASPECTS [facility] Stroke Program Early CT Score) Not scored with this history. IMPRESSION: 1. No evidence of acute intracranial abnormality. 2. Severe chronic small vessel ischemic disease. These results were called by telephone at the time of interpretation on 10/01/2018 at 8:10 pm to Dr. Roderic Palau, who verbally acknowledged these results. Electronically Signed   By: Logan Bores M.D.   On: 10/01/2018 20:10    Procedures .Critical Care Performed by: Franchot Heidelberg, PA-C Authorized by: Franchot Heidelberg, PA-C   Critical care provider statement:    Critical care time (minutes):  45   Critical care time was exclusive of:  Separately billable procedures and treating other patients and teaching time   Critical care was necessary to treat or prevent imminent or life-threatening deterioration of the following conditions:  CNS failure or compromise   Critical care was time spent personally by me on the following activities:  Blood draw for specimens, development of treatment plan with patient or surrogate, discussions with consultants, evaluation of patient's response to treatment, examination of patient, obtaining history from patient or surrogate, ordering and performing treatments and interventions, ordering and review of laboratory studies, ordering and review of radiographic studies, pulse oximetry, re-evaluation of patient's condition and review of old charts   I assumed direction of critical care for this patient from another provider in my specialty: no   Comments:     Pt with acute neuro deficits. Code stroke called. Pt admitted  to the hospital.    (including critical care time)  Medications Ordered in ED Medications  iohexol (OMNIPAQUE) 350 MG/ML injection 140 mL (100 mLs Intravenous Contrast Given 10/01/18 2047)  labetalol (NORMODYNE) injection 20 mg (20 mg  Intravenous Given 10/01/18 2110)     Initial Impression / Assessment and Plan / ED Course  I have reviewed the triage vital signs and the nursing notes.  Pertinent labs & imaging results that were available during my care of the patient were reviewed by me and considered in my medical decision making (see chart for details).        1946: I evaluated the pt. Patient presenting for evaluation of abnormal speech.  Physical exam concerning for stroke, as she has new deficits including aphasia, inability to track finger, and ?facial droop which began at 1600. Code stroke called 1952. Case discussed with attending, Dr. Roderic Palau evaluated the pt.   Ct head negative. Per teleneuro recommendations, will order cta with perfusion. Pt is not a tPA candidate due to taking elequis. Pt's son updated.   Pt's BP elevated at 220/100 when performed manually, will given 20 mg labetalol.   CTA negative for LVO, but shows likely acute infarct without penumbra. As pt will not need emergent IR intervention, will call for admission to hospitalist service.   Discussed with Dr. Denton Brick from [facility] who will admit the pt.   Pt and son updated and agreeable to plan.   Final Clinical Impressions(s) / ED Diagnoses   Final diagnoses:  Cerebrovascular accident (CVA), unspecified mechanism [facility])    ED Discharge Orders    None       Franchot Heidelberg, PA-C 10/01/18 2212    Milton Ferguson, MD 10/04/18 1304

## 2018-10-02 ENCOUNTER — Inpatient Hospital Stay (HOSPITAL_COMMUNITY): Payer: Medicare Other

## 2018-10-02 DIAGNOSIS — I6389 Other cerebral infarction: Secondary | ICD-10-CM

## 2018-10-02 DIAGNOSIS — I639 Cerebral infarction, unspecified: Secondary | ICD-10-CM

## 2018-10-02 LAB — LIPID PANEL
Cholesterol: 163 mg/dL (ref 0–200)
HDL: 45 mg/dL (ref >40)
LDL Cholesterol: 106 mg/dL — ABNORMAL HIGH (ref 0–99)
Total CHOL/HDL Ratio: 3.6 RATIO
Triglycerides: 59 mg/dL (ref <150)
VLDL: 12 mg/dL (ref 0–40)

## 2018-10-02 LAB — HEMOGLOBIN A1C
Hgb A1c MFr Bld: 5.1 % (ref 4.8–5.6)
Mean Plasma Glucose: 99.67 mg/dL

## 2018-10-02 LAB — ECHOCARDIOGRAM COMPLETE
Height: 62 in
Weight: 2867.74 oz

## 2018-10-02 MED ORDER — STROKE: EARLY STAGES OF RECOVERY BOOK
Freq: Once | Status: AC
  Administered 2018-10-02: 01:00:00
  Filled 2018-10-02: qty 1

## 2018-10-02 MED ORDER — HYDRALAZINE HCL 20 MG/ML IJ SOLN: 10.0000 mg | INTRAMUSCULAR | Status: DC | PRN

## 2018-10-02 MED ORDER — ALBUTEROL SULFATE (2.5 MG/3ML) 0.083% IN NEBU: 2.5000 mg | INHALATION_SOLUTION | Freq: Four times a day (QID) | RESPIRATORY_TRACT | Status: DC | PRN

## 2018-10-02 MED ORDER — ACETAMINOPHEN 650 MG RE SUPP: 650.0000 mg | RECTAL | Status: DC | PRN

## 2018-10-02 MED ORDER — ACETAMINOPHEN 160 MG/5ML PO SOLN: 650.0000 mg | ORAL | Status: DC | PRN

## 2018-10-02 MED ORDER — METOPROLOL SUCCINATE ER 25 MG PO TB24
25.0000 mg | ORAL_TABLET | Freq: Every day | ORAL | Status: DC
  Administered 2018-10-02 – 2018-10-03 (×2): 25 mg via ORAL
  Filled 2018-10-02 (×2): qty 1

## 2018-10-02 MED ORDER — APIXABAN 5 MG PO TABS
5.0000 mg | ORAL_TABLET | Freq: Two times a day (BID) | ORAL | Status: DC
  Administered 2018-10-02 – 2018-10-03 (×3): 5 mg via ORAL
  Filled 2018-10-02 (×3): qty 1

## 2018-10-02 MED ORDER — SENNOSIDES-DOCUSATE SODIUM 8.6-50 MG PO TABS: 1.0000 | ORAL_TABLET | Freq: Every evening | ORAL | Status: DC | PRN

## 2018-10-02 MED ORDER — ACETAMINOPHEN 325 MG PO TABS
650.0000 mg | ORAL_TABLET | ORAL | Status: DC | PRN
  Administered 2018-10-03: 650 mg via ORAL
  Filled 2018-10-02: qty 2

## 2018-10-02 NOTE — Progress Notes (Signed)
Got a hold of patient's granddaughter Flara Storti [phone] who was able to give me the patient's medical and surgical history. Patient cleared for MRI

## 2018-10-02 NOTE — Progress Notes (Signed)
[place] for apixaban Indication: PAF  Labs: Recent Labs    10/01/18 1954 10/01/18 2012  HGB 13.0 13.9  HCT 41.0 41.0  PLT 200  --   APTT 40*  --   LABPROT 17.6*  --   INR 1.5*  --   CREATININE 1.30* 1.40*    Assessment: 64 yof with hx of afib on apixaban PTA presenting with stroke-like symptoms, confusion, aphasia. MRI negative for acute stroke. Plan is for EEG to r/o seizures. Pharmacy consulted to resume apixaban inpatient. SCr 1.4 on admit, CBC wnl. No active bleed issues documented.  Goal of Therapy:  Stroke prevention Monitor platelets by anticoagulation protocol: Yes   Plan:  Apixaban 5mg  PO BID Monitor CBC, s/sx bleeding  Elicia Lamp, PharmD, BCPS Clinical Pharmacist 10/02/2018 1:16 PM

## 2018-10-02 NOTE — Plan of Care (Signed)
  Problem: Education: Goal: Knowledge of disease or condition will improve Outcome: Progressing Goal: Knowledge of secondary prevention will improve Outcome: Progressing Goal: Knowledge of patient specific risk factors addressed and post discharge goals established will improve Outcome: Progressing   

## 2018-10-02 NOTE — Progress Notes (Deleted)
Since pt is not A&O x 4, we are waiting for a CXR and Abdomen x-ray, so that the pt can be cleared for her MRI, RN aware and said she would get the orders in

## 2018-10-02 NOTE — Progress Notes (Signed)
  Speech Language Pathology Treatment: Cognitive-Linquistic(Aphasia)  Patient Details Name: Holly Flores MRN: [id number] DOB: May 17, 1945 Today's Date: 10/02/2018 Time: 6153-7943 SLP Time Calculation (min) (ACUTE ONLY): 25 min  Assessment / Plan / Recommendation Clinical Impression  Pt was seen for aphasia treatment and was cooperative throughout the session. She frequently stated, "I know what it is but I just can't get it" during episodes of word retrieval difficulty but did not allow frustration to negatively impact her participation in the session. She provided 4-7 items per concrete category during divergent naming tasks with an average of 5 items when mod cues were given. She was able to participate in conversation with the SLP with min-mod support and demonstrated 60% accuracy during structured sentence formulation tasks increasing to 100% with min-mod cues. She completed responsive naming with 80% accuracy increasing to 100% with min. cues. SLP will continue to follow pt.    HPI HPI: Pt is a 73 y.o. female with medical history significant for paroxysmal atrial fibrillation, diastolic CHF, COPD, hypertension, right hemiparesis from prior stroke, CKD 3, who was brought to the ED with reports of change in speech that started about 3 hours prior to arrival. CT of the head was negative for acute changes but showed chronic small vessel white matter ischemic changes and old left basal ganglia and thalamic infarct. MRI showed Late subacute to chronic infarct in the left corona radiata to centrum semiovale      SLP Plan  Continue with current plan of care  Patient needs continued Speech Lanaguage Pathology Services    Recommendations                   Follow up Recommendations: Outpatient SLP SLP Visit Diagnosis: Aphasia (R47.01) Plan: Continue with current plan of care       Slayton Lubitz I. Hardin Negus, [place], [place] Office number [phone] Pager  [phone]                Horton Marshall 10/02/2018, 12:24 PM

## 2018-10-02 NOTE — Evaluation (Signed)
Speech Language Pathology Evaluation Patient Details Name: Holly Flores MRN: [id number] DOB: 03-Mar-1945 Today's Date: 10/02/2018 Time: 4431-5400 SLP Time Calculation (min) (ACUTE ONLY): 16 min  Problem List:  Patient Active Problem List   Diagnosis Date Noted  . Acute CVA (cerebrovascular accident) ([place]) 10/01/2018  . Physical deconditioning 12/19/2017  . Chronic kidney disease (CKD), stage III (moderate) (HCC)   . Hypoalbuminemia due to protein-calorie malnutrition ([place])   . Intractable migraine without status migrainosus   . Reactive depression   . Hemiparesis affecting right side as late effect of stroke ([place])   . Slow transit constipation   . PAF (paroxysmal atrial fibrillation) ([place])   . Dysphasia, post-stroke   . COPD (chronic obstructive pulmonary disease) ([place]) 10/29/2017  . Chronic diastolic CHF (congestive heart failure) ([place]) 10/29/2017  . Hypothyroidism 10/29/2017  . Essential hypertension 10/29/2017  . GERD (gastroesophageal reflux disease) 10/29/2017  . Anxiety 10/29/2017  . 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 Hyperlipidemia 04/20/2013   Past Medical History:  Past Medical History:  Diagnosis Date  . Anxiety   . Arthritis    "all over" (12/20/2017)  . Central retinal vein occlusion of left eye 01/22/2015  . CHF (congestive heart failure) ([place])   . Chronic bronchitis ([place])    "get it most years" (12/20/2017)  . Chronic lower back pain   . CKD (chronic kidney disease), stage II   . COPD (chronic obstructive pulmonary disease) ([place])   . CVA (cerebral vascular accident) ([place]) 12/30/2014   OCULAR  LEFT  EYE   . Depression   . Fibromyalgia   . GERD (gastroesophageal reflux disease)   . Goiter   . History of colonic polyps 05/14/2014  . Hyperlipidemia   . Hypertension   . Hypothyroidism   . Kidney cysts    left side  . 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 Migraine    "couple times/wk; been  having them for a long time" (12/20/2017)  . Myocardial infarction [facility])    "one dr said I did; one said I didn't;  don't remember when it was" (12/20/2017)  . 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 On home oxygen therapy    "3L; sleep w/it q night" (12/20/2017)  . Pneumonia    "several times" (12/20/2017)  . Shortness of breath   . Sleep apnea    "stopped mask when I got on the oxygen" (12/20/2017)  . Stroke [facility])    Past Surgical History:  Past Surgical History:  Procedure Laterality Date  . ANTERIOR CERVICAL DECOMP/DISCECTOMY FUSION    . BACK SURGERY    . BREAST BIOPSY     left-non cancerous  . CARDIAC CATHETERIZATION    . CARPAL TUNNEL RELEASE Left   . CATARACT EXTRACTION W/PHACO  09/21/2011   Procedure: CATARACT EXTRACTION PHACO AND INTRAOCULAR LENS PLACEMENT (IOC);  Surgeon: Williams Che, MD;  Location: [facility];  Service: Ophthalmology;  Laterality: Left;  CDE:9.78  . CATARACT EXTRACTION W/PHACO Right 10/15/2014   Procedure: CATARACT EXTRACTION PHACO AND INTRAOCULAR LENS PLACEMENT ([place]);  Surgeon: Williams Che, MD;  Location: [facility];  Service: Ophthalmology;  Laterality: Right;  CDE:4.19  . [place]; 1972  . CHOLECYSTECTOMY OPEN    . COLONOSCOPY  2006   RMR: 1. Internal hemorroids, otherwise normal rectum. 2. Pedunculated polyp at 35 cm. reomved with snare. The remainder of teh colonic mucosa appeared normal.   . COLONOSCOPY  2011  Dr. Gala Romney: multiple ascending colon polyps and rectal polyp, adenomatous  . COLONOSCOPY N/A 05/31/2014   Procedure: COLONOSCOPY;  Surgeon: Daneil Dolin, MD;  Location: [facility];  Service: Endoscopy;  Laterality: N/A;  130  . COLONOSCOPY WITH PROPOFOL N/A 06/24/2017   Procedure: COLONOSCOPY WITH PROPOFOL;  Surgeon: Daneil Dolin, MD;  Location[facility];  Service: Endoscopy;  Laterality: N/A;  2:15pm  . ESOPHAGOGASTRODUODENOSCOPY  2006   Dr. Gala Romney: Subtle Schatzkis ring, otherwise normal upper GI tract, aside from a small  pyloric channell erosion, status post dilation as described above.   Marland Kitchen FLEXIBLE SIGMOIDOSCOPY N/A 11/11/2017   Procedure: FLEXIBLE SIGMOIDOSCOPY;  Surgeon: Otis Brace, MD;  Location: [facility];  Service: Gastroenterology;  Laterality: N/A;  . JOINT REPLACEMENT    . LUMBAR DISC SURGERY  X 2  . POLYPECTOMY  06/24/2017   Procedure: POLYPECTOMY;  Surgeon: Daneil Dolin, MD;  Location: [facility];  Service: Endoscopy;;  ascending  . POSTERIOR LUMBAR FUSION  ? date 1st fusion ; 07/2006   previous L4-5 Ray threaded fusion cage; Exploration of L4-5 fusion & PLIF 07/22/2006/notes 07/22/2006  . TONSILLECTOMY    . TOTAL KNEE ARTHROPLASTY Right 05/24/2013   Procedure: TOTAL KNEE ARTHROPLASTY;  Surgeon: [name], MD;  Location: [place];  Service: Orthopedics;  Laterality: Right;  . TOTAL KNEE ARTHROPLASTY Left   . VAGINAL HYSTERECTOMY     HPI:  Pt is a 73 y.o. female with medical history significant for paroxysmal atrial fibrillation, diastolic CHF, COPD, hypertension, right hemiparesis from prior stroke, CKD 3, who was brought to the ED with reports of change in speech that started about 3 hours prior to arrival. CT of the head was negative for acute changes but showed chronic small vessel white matter ischemic changes and old left basal ganglia and thalamic infarct. MRI showed late subacute to chronic infarct in the left corona radiata to centrum semiovale   Assessment / Plan / Recommendation Clinical Impression  Pt reported that she was living independently with her husband prior to admission. She is known to speech pathology and was receiving outpatient SLP services for dysarthria until 09/29/18 when she was discharged with speech intelligibility >95%. She presents with mild anomic aphasia characterized by difficulty with naming and word retrieval during conversation with grossly intact receptive language skills. Pt demonstrated phonemic paraphasias and neologisms during attempts at word  retrieval and benefited the most from phonemic cues. Skilled SLP services are clinically indicated at this time to improve verbal expression. Pt and nursing were educated regarding results and recommendations; both parties verbalized understanding as well as agreement with plan of care.     SLP Assessment  SLP Recommendation/Assessment: Patient needs continued Speech Lanaguage Pathology Services SLP Visit Diagnosis: Aphasia (R47.01)    Follow Up Recommendations  Outpatient SLP    Frequency and Duration min 2x/week  2 weeks      SLP Evaluation Cognition  Overall Cognitive Status: Difficult to assess(Due to aphasia ) Arousal/Alertness: Awake/alert Orientation Level: Oriented to person;Oriented to place;Oriented to situation;Disoriented to time(Oriented to month, day not date/year likely due to aphasia) Attention: Focused;Sustained Focused Attention: Appears intact Sustained Attention: Appears intact       Comprehension  Auditory Comprehension Overall Auditory Comprehension: Appears within functional limits for tasks assessed Yes/No Questions: Within Functional Limits Basic Biographical Questions: (5/5) Complex Questions: (4/5) Paragraph Comprehension (via yes/no questions): (4/4) Commands: Impaired Two Step Basic Commands: (4/4) Multistep Basic Commands: (3/4) Visual Recognition/Discrimination Discrimination: Within Function Limits  Reading Comprehension Reading Status: Within funtional limits    Expression Expression Primary Mode of Expression: Verbal Verbal Expression Overall Verbal Expression: Impaired Initiation: No impairment Automatic Speech: Counting;Day of week;Month of year(WFL with cues to initiate day and month sequences) Level of Generative/Spontaneous Verbalization: Sentence Repetition: No impairment(5/5) Naming: Impairment Responsive: (5/5) Confrontation: (7/10) Convergent: (5/5) Verbal Errors: Phonemic paraphasias;Neologisms Pragmatics: No  impairment Written Expression Dominant Hand: Right   Oral / Motor  Motor Speech Overall Motor Speech: Appears within functional limits for tasks assessed Respiration: Within functional limits Phonation: Normal Resonance: Within functional limits Articulation: Within functional limitis Intelligibility: Intelligible Motor Planning: Witnin functional limits Motor Speech Errors: Not applicable   Vaniah Chambers I. Hardin Negus, [place], [place] Office number [phone] Pager [place] 10/02/2018, 12:16 PM

## 2018-10-02 NOTE — Consult Note (Signed)
Referring Physician:  Shelly Coss, MD    Reason for Consult: stroke  HPI: Holly Flores is a 73 y.o. 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 A tele-neurology consult was obtained from Elenor Quinones, MD who also noted a right facial droop and right hemianopia. The pt was not a candidate for tPA due to anticoagulation with Eliquis. A Head CT showed no acute intracranial abnormality. A CTA showed no emergent large vessel occlusion. CT perfusion suggested a small infarct in the left centrum semiovale without significant penumbra. An MRI is currently pending. The patient was transferred to [facility] for further evaluation and treatment.  Date last known well: Date: 10/01/2018 Time last known well: Time: 17:00  tPA Given: no - pt on Eliquis  Past Medical History Past Medical History:  Diagnosis Date  . Anxiety   . Arthritis    "all over" (12/20/2017)  . Central retinal vein occlusion of left eye 01/22/2015  . CHF (congestive heart failure) ([place])   . Chronic bronchitis ([place])    "get it most years" (12/20/2017)  . Chronic lower back pain   . CKD (chronic kidney disease), stage II   . COPD (chronic obstructive pulmonary disease) ([place])   . CVA (cerebral vascular accident) ([place]) 12/30/2014   OCULAR  LEFT  EYE   . Depression   . Fibromyalgia   . GERD (gastroesophageal reflux disease)   . Goiter   . History of colonic polyps 05/14/2014  . Hyperlipidemia   . Hypertension   . Hypothyroidism   . Kidney cysts    left side  . 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 Migraine    "couple times/wk; been having them for a long time" (12/20/2017)  . Myocardial infarction [facility])    "one dr said I did; one said I didn't;  don't remember when it was" (12/20/2017)  . 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 On home oxygen therapy    "3L; sleep w/it q night" (12/20/2017)  . Pneumonia    "several times" (12/20/2017)  . Shortness of breath   . Sleep apnea    "stopped mask when I got on the oxygen" (12/20/2017)  . Stroke [facility])     Surgical History Past Surgical History:  Procedure Laterality Date  . ANTERIOR CERVICAL DECOMP/DISCECTOMY FUSION    . BACK SURGERY    . BREAST BIOPSY     left-non cancerous  . CARDIAC CATHETERIZATION    . CARPAL TUNNEL RELEASE Left   . CATARACT EXTRACTION W/PHACO  09/21/2011   Procedure: CATARACT EXTRACTION PHACO AND INTRAOCULAR LENS PLACEMENT (IOC);  Surgeon: Williams Che, MD;  Location: [facility];  Service: Ophthalmology;  Laterality: Left;  CDE:9.78  . CATARACT EXTRACTION W/PHACO Right 10/15/2014   Procedure: CATARACT EXTRACTION PHACO AND INTRAOCULAR LENS PLACEMENT ([place]);  Surgeon: Williams Che, MD;  Location: [facility];  Service: Ophthalmology;  Laterality: Right;  CDE:4.19  . [place]; 1972  . CHOLECYSTECTOMY OPEN    . COLONOSCOPY  2006   RMR: 1. Internal hemorroids, otherwise normal rectum. 2. Pedunculated polyp at 35 cm. reomved  with snare. The remainder of teh colonic mucosa appeared normal.   . COLONOSCOPY  2011   Dr. Gala Romney: multiple ascending colon polyps and rectal polyp, adenomatous  . COLONOSCOPY N/A 05/31/2014   Procedure: COLONOSCOPY;  Surgeon: Daneil Dolin, MD;  Location: [facility];  Service: Endoscopy;  Laterality: N/A;  130  . COLONOSCOPY WITH PROPOFOL N/A 06/24/2017   Procedure: COLONOSCOPY WITH PROPOFOL;  Surgeon: Daneil Dolin, MD;  Location: [facility];  Service: Endoscopy;  Laterality: N/A;  2:15pm  . ESOPHAGOGASTRODUODENOSCOPY  2006   Dr. Gala Romney: Subtle Schatzkis ring, otherwise normal upper  GI tract, aside from a small pyloric channell erosion, status post dilation as described above.   Marland Kitchen FLEXIBLE SIGMOIDOSCOPY N/A 11/11/2017   Procedure: FLEXIBLE SIGMOIDOSCOPY;  Surgeon: Otis Brace, MD;  Location: [place];  Service: Gastroenterology;  Laterality: N/A;  . JOINT REPLACEMENT    . LUMBAR DISC SURGERY  X 2  . POLYPECTOMY  06/24/2017   Procedure: POLYPECTOMY;  Surgeon: Daneil Dolin, MD;  Location: [facility];  Service: Endoscopy;;  ascending  . POSTERIOR LUMBAR FUSION  ? date 1st fusion ; 07/2006   previous L4-5 Ray threaded fusion cage; Exploration of L4-5 fusion & PLIF 07/22/2006/notes 07/22/2006  . TONSILLECTOMY    . TOTAL KNEE ARTHROPLASTY Right 05/24/2013   Procedure: TOTAL KNEE ARTHROPLASTY;  Surgeon: [name], MD;  Location: [place];  Service: Orthopedics;  Laterality: Right;  . TOTAL KNEE ARTHROPLASTY Left   . VAGINAL HYSTERECTOMY      Family History  Family History  Problem Relation Age of Onset  . Cancer Other   . Diabetes Other   . Heart disease Other   . Hypertension Other   . Asthma Other   . Alcoholism Other   . Thyroid disease Other   . Rheumatologic disease Other   . Stroke Other   . Hypercholesterolemia Other   . Colon cancer Neg Hx     Social History:   reports that she quit smoking about 31 years ago. Her smoking use included cigarettes. She has a 5.00 pack-year smoking history. She has never used smokeless tobacco. She reports previous alcohol use. She reports that she does not use drugs.  Allergies:  Allergies  Allergen Reactions  . Penicillins Hives, Itching and Rash    Has patient had a PCN reaction causing immediate rash, facial/tongue/throat swelling, SOB or lightheadedness with hypotension: Yes Has patient had a PCN reaction causing severe rash involving mucus membranes or skin necrosis: Yes Has patient had a PCN reaction that required hospitalization: No Has patient had a PCN reaction occurring within the last 10 years:  No If all of the above answers are "NO", then may proceed with Cephalosporin use.   . Sulfa Antibiotics Swelling    Tongue swelling    Home Medications:  Medications Prior to Admission  Medication Sig Dispense Refill  . albuterol (PROVENTIL HFA;VENTOLIN HFA) 108 (90 Base) MCG/ACT inhaler Inhale 2 puffs into the lungs every 6 (six) hours as needed (for wheezing/shortness of breath). (Patient taking differently: Inhale 2 puffs into the lungs every 4 (four) hours as needed (for wheezing/shortness of breath). ) 1 Inhaler 1  . ALPRAZolam (XANAX) 1 MG tablet Take 1 mg by mouth 2 (two) times daily as needed for anxiety.    Marland Kitchen apixaban (ELIQUIS) 5 MG TABS tablet Take 1 tablet (5 mg total) by mouth 2 (two) times daily. 60 tablet 3  . cyclobenzaprine (FLEXERIL) 5 MG tablet Take 1 tablet (  5 mg total) by mouth at bedtime. 30 tablet 0  . metoprolol succinate (TOPROL-XL) 25 MG 24 hr tablet Take 25 mg by mouth 2 (two) times a day.     . naproxen (NAPROSYN) 500 MG tablet Take 500 mg by mouth 2 (two) times daily as needed for mild pain or moderate pain (with food/milk).    . potassium chloride SA (K-DUR,KLOR-CON) 20 MEQ tablet Take 1 tablet (20 mEq total) by mouth daily. (Patient taking differently: Take 20 mEq by mouth 2 (two) times daily. ) 30 tablet 1    Hospital Medications . atorvastatin  40 mg Oral q1800    14 system review of systems is positive for weakness, vision loss, hip pain and all other systems negative     Physical Examination:  Vitals:   10/02/18 0224 10/02/18 0424 10/02/18 0624 10/02/18 0805  BP: (!) 154/93 135/81 128/78 129/68  Pulse: 87 87 80 80  Resp: 19 17 18 16   Temp: 98.7 F (37.1 C) 99.1 F (37.3 C) 98.4 F (36.9 C) 99.1 F (37.3 C)  TempSrc: Oral Oral Oral Oral  SpO2: 97% 96% 98% 100%  Weight:      Height:       Obese elderly African-American lady lying comfortably in bed.  Not in distress. . Afebrile. Head is nontraumatic. Neck is supple without bruit.    Cardiac  exam no murmur or gallop. Lungs are clear to auscultation. Distal pulses are well felt.  Neurological Exam :  Awake alert oriented to time place and person.  Speech is clear without dysarthria or aphasia.  Extraocular movements are full range without nystagmus.  Decreased blink to threat on the right compared to the left.  Right lower facial weakness.  Tongue midline.  Dense right hemiplegia with 0/5 right upper and lower extremity strength.  Normal strength on the left.  NIHSS 10   LABORATORY STUDIES:  Basic Metabolic Panel: Recent Labs  Lab 10/01/18 1954 10/01/18 2012  NA 140 143  K 4.2 4.1  CL 106 104  CO2 28  --   GLUCOSE 110* 103*  BUN 23 22  CREATININE 1.30* 1.40*  CALCIUM 9.1  --     Liver Function Tests: Recent Labs  Lab 10/01/18 1954  AST 11*  ALT 9  ALKPHOS 72  BILITOT 0.5  PROT 7.5  ALBUMIN 3.9   No results for input(s): LIPASE, AMYLASE in the last 168 hours. No results for input(s): AMMONIA in the last 168 hours.  CBC: Recent Labs  Lab 10/01/18 1954 10/01/18 2012  WBC 8.8  --   NEUTROABS 3.0  --   HGB 13.0 13.9  HCT 41.0 41.0  MCV 100.0  --   PLT 200  --     Cardiac Enzymes: No results for input(s): CKTOTAL, CKMB, CKMBINDEX, TROPONINI in the last 168 hours.  BNP: Invalid input(s): POCBNP  CBG: Recent Labs  Lab 10/01/18 1953  GLUCAP 107*    Microbiology:   Coagulation Studies: Recent Labs    10/01/18 1954  LABPROT 17.6*  INR 1.5*    Urinalysis:  Recent Labs  Lab 10/01/18 2111  COLORURINE STRAW*  LABSPEC 1.019  PHURINE 7.0  GLUCOSEU NEGATIVE  HGBUR NEGATIVE  BILIRUBINUR NEGATIVE  KETONESUR NEGATIVE  PROTEINUR NEGATIVE  NITRITE NEGATIVE  LEUKOCYTESUR NEGATIVE    Lipid Panel:     Component Value Date/Time   CHOL 163 10/02/2018 0414   TRIG 59 10/02/2018 0414   HDL 45 10/02/2018 0414   CHOLHDL 3.6 10/02/2018 0414  VLDL 12 10/02/2018 0414   LDLCALC 106 (H) 10/02/2018 0414    HgbA1C:  Lab Results  Component  Value Date   HGBA1C 5.1 10/02/2018    Urine Drug Screen:      Component Value Date/Time   LABOPIA NONE DETECTED 10/01/2018 2111   COCAINSCRNUR NONE DETECTED 10/01/2018 2111   LABBENZ POSITIVE (A) 10/01/2018 2111   AMPHETMU NONE DETECTED 10/01/2018 2111   THCU NONE DETECTED 10/01/2018 2111   LABBARB NONE DETECTED 10/01/2018 2111     Alcohol Level:  Recent Labs  Lab 10/01/18 1954  ETH <10    Miscellaneous labs:  EKG  EKG - SB rate 59 BPM. (See cardiology reading for complete details)   IMAGING:   Ct Code Stroke Cta Head W/wo Contrast Ct Code Stroke Cta Neck W/wo Contrast Ct Code Stroke Cta Cerebral Perfusion W/wo Contrast 10/01/2018  IMPRESSION:  1. No emergent large vessel occlusion.  2. Mild atherosclerosis in the head and neck without significant stenosis.  3. CT perfusion imaging suggests a small infarct in the left centrum semiovale without significant penumbra.  4. 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  5.  Aortic Atherosclerosis (ICD10-I70.0).   Ct Head Code Stroke Wo Contrast 10/01/2018 IMPRESSION:  1. No evidence of acute intracranial abnormality.  2. Severe chronic small vessel ischemic disease. 3. ASPECTS ([place] Stroke Program Early CT Score) Not scored with this history.    MRI Head WO Contrast -no acute infarct.  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   Transthoracic Echocardiogram - pending    Assessment: 73 y.o. 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 Not a tPA candidate secondary to  anticoagulation.  Stroke:  small infarct in the left centrum semiovale likely embolic secondary to PAF despite Eliquis on CT perfusion but not verified on MRI.  Patient likely had worsening of old deficits from previous subacute infarct anxiety etiology indeterminate unwitnessed seizure and postictal deficit probably.  Resultant worsening of pre-existing right hemiparesis  CT head - No evidence of acute intracranial abnormality. Severe chronic small vessel ischemic disease. ASPECTS [facility] Stroke Program Early CT Score) not scored with this history.   MRI head - pending  MRA head - not ordered  CTA H&N - No emergent large vessel occlusion. 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   CT Perfusion -  suggests a small infarct in the left centrum semiovale without significant penumbra.  Carotid Doppler - CTA neck performed - carotid dopplers not indicated.  2D Echo - pending  Lacey Jensen Virus 2 - negative  LDL - 106  HgbA1c - 5.1  UDS - benzodiazepine (on Rx Xanax PTA)  VTE prophylaxis - SCDs  Diet  - Heart healthy with thin liquids.  Eliquis (apixaban) daily prior to admission, now on aspirin 325 mg daily  Patient counseled to be compliant with her antithrombotic medications  Ongoing aggressive stroke risk factor management  Therapy recommendations:  Outpt PT and OT  Disposition:  Pending  Hypertension  Stable . Permissive hypertension but gradually normalize in  5-7 days . Avoid extreme high BP secondary to small right PCA and ICA aneurysms. . Long-term BP goal normotensive  Hyperlipidemia  Lipid lowering medication PTA: none  LDL 106, goal < 70  Current lipid lowering medication: Lipitor 40 mg daily  Continue statin at discharge   Other Stroke Risk Factors  Advanced age  Former cigarette smoker - quit  Previous ETOH use - advised to drink no more than 1 alcoholic beverage per day.  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    resume Eliquis .  Check echocardiogram results  Mobilize out of bed.  Physical occupational therapy and rehab consults. .   I have personally obtained history,examined this patient, reviewed notes, independently viewed imaging studies, participated in medical decision making and plan of care.ROS completed by me personally and pertinent positives fully documented  I have made any additions or clarifications directly to the above note.  Patient presented with worsening of pre-existing right hemiplegia exact etiology unclear whether small extension of previous subacute infarct or unwitnessed seizure with postictal increase paralysis.  Check EEG and follow echo results.  Resume Eliquis.  Aggressive risk factor modification.  Discussed with patient and Dr. Tamsen Meek.  Greater than 50% time during this 35-minute visit was spent on counseling and coordination of care about her stroke and atrial fibrillation and answering questions  [name], MD Medical Director [facility] Pager: [phone] 10/02/2018 12:47 PM

## 2018-10-02 NOTE — Progress Notes (Signed)
PROGRESS NOTE    Holly Flores  AST:[id number] DOB: 1945-08-15 DOA: 10/01/2018 PCP: Octavio Graves, DO (Inactive)   Brief Narrative:  Patient is a 73 year old female with history of paroxysmal A. fib, diastolic CHF, COPD, hypertension, right hemiparesis from prior stroke, CKD stage III who was brought from home with complaints of inability to speak, headache.  At baseline, patient ambulates with the help of wheelchair due to right-sided hemiparesis.  MRI done this morning does not show any acute intracranial abnormalities.  Plan is to EEG.  Neurology following  Assessment & Plan:   Active Problems:   Acute CVA (cerebrovascular accident) ([place])   Suspected stroke/ confusion /aphasia: MRI of the brain does not show any acute stroke.  This morning her speech was clear and fluent.  Denies any headache.  Alert and oriented.  Plan is to do an EEG to rule out seizures.  History of stroke: History of stroke a year ago.  Has dense right-sided hemiplegia.  Wheelchair-bound.  Continue current medicines. She is seen by PT/OT and recommended home health on discharge.  Paroxysmal A. fib: Currently in sinus rhythm.  Rate is controlled.  Continue Eliquis for anticoagulation.  Resume metoprolol  Hypertension: Continue current medicines.  Currently blood pressure acceptable.  Diastolic CHF: Currently stable and compensated.  Last echo in 10/2017 showed ejection fraction of 60 to 65%.  On torsemide at home.  CKD stage III: Creatinine at baseline.         DVT prophylaxis: Eliquis Code Status: Full Family Communication: Discussed with son on phone today Disposition Plan: Home tomorrow   Consultants: Neurology  Procedures: MRI  Antimicrobials:  Anti-infectives (From admission, onward)   Start     Dose/Rate Route Frequency Ordered Stop   10/01/18 2300  cefTRIAXone (ROCEPHIN) 1 g in sodium chloride 0.9 % 100 mL IVPB  Status:  Discontinued     1 g 200 mL/hr over 30 Minutes Intravenous  Every 24 hours 10/01/18 2252 10/01/18 2258   10/01/18 2300  aztreonam (AZACTAM) injection 1 g  Status:  Discontinued     1 g Intravenous Every 8 hours 10/01/18 2258 10/02/18 0749      Subjective: Patient seen and examined at bedside this morning.  Hemodynamically stable.  Comfortable.  This morning she did not have any aphasia.  Remains alert and oriented.  Denies any headache or any new complaints  Objective: Vitals:   10/02/18 0424 10/02/18 0624 10/02/18 0805 10/02/18 1124  BP: 135/81 128/78 129/68 133/65  Pulse: 87 80 80 (!) 59  Resp: 17 18 16 17   Temp: 99.1 F (37.3 C) 98.4 F (36.9 C) 99.1 F (37.3 C) 98.2 F (36.8 C)  TempSrc: Oral Oral Oral Oral  SpO2: 96% 98% 100% 99%  Weight:      Height:        Intake/Output Summary (Last 24 hours) at 10/02/2018 1250 Last data filed at 10/02/2018 0424 Gross per 24 hour  Intake --  Output 550 ml  Net -550 ml   Filed Weights   10/01/18 1931 10/02/18 0032  Weight: 74.8 kg 81.3 kg    Examination:  General exam: Appears calm and comfortable ,Not in distress,obese HEENT:PERRL,Oral mucosa moist, Ear/Nose normal on gross exam Respiratory system: Bilateral equal air entry, normal vesicular breath sounds, no wheezes or crackles  Cardiovascular system: S1 & S2 heard, RRR. No JVD, murmurs, rubs, gallops or clicks. No pedal edema. Gastrointestinal system: Abdomen is nondistended, soft and nontender. No organomegaly or masses felt. Normal bowel sounds heard.  Central nervous system: Alert and oriented.  Dense hemiplegia on the right side  extremities: No edema, no clubbing ,no cyanosis, distal peripheral pulses palpable. Skin: No rashes, lesions or ulcers,no icterus ,no pallor MSK: Normal muscle bulk,tone ,power Psychiatry: Judgement and insight appear normal. Mood & affect appropriate.     Data Reviewed: I have personally reviewed following labs and imaging studies  CBC: Recent Labs  Lab 10/01/18 1954 10/01/18 2012  WBC 8.8  --    NEUTROABS 3.0  --   HGB 13.0 13.9  HCT 41.0 41.0  MCV 100.0  --   PLT 200  --    Basic Metabolic Panel: Recent Labs  Lab 10/01/18 1954 10/01/18 2012  NA 140 143  K 4.2 4.1  CL 106 104  CO2 28  --   GLUCOSE 110* 103*  BUN 23 22  CREATININE 1.30* 1.40*  CALCIUM 9.1  --    GFR: Estimated Creatinine Clearance: 35.9 mL/min (A) (by C-G formula based on SCr of 1.4 mg/dL (H)). Liver Function Tests: Recent Labs  Lab 10/01/18 1954  AST 11*  ALT 9  ALKPHOS 72  BILITOT 0.5  PROT 7.5  ALBUMIN 3.9   No results for input(s): LIPASE, AMYLASE in the last 168 hours. No results for input(s): AMMONIA in the last 168 hours. Coagulation Profile: Recent Labs  Lab 10/01/18 1954  INR 1.5*   Cardiac Enzymes: No results for input(s): CKTOTAL, CKMB, CKMBINDEX, TROPONINI in the last 168 hours. BNP (last 3 results) No results for input(s): PROBNP in the last 8760 hours. HbA1C: Recent Labs    10/02/18 0414  HGBA1C 5.1   CBG: Recent Labs  Lab 10/01/18 1953  GLUCAP 107*   Lipid Profile: Recent Labs    10/02/18 0414  CHOL 163  HDL 45  LDLCALC 106*  TRIG 59  CHOLHDL 3.6   Thyroid Function Tests: No results for input(s): TSH, T4TOTAL, FREET4, T3FREE, THYROIDAB in the last 72 hours. Anemia Panel: No results for input(s): VITAMINB12, FOLATE, FERRITIN, TIBC, IRON, RETICCTPCT in the last 72 hours. Sepsis Labs: No results for input(s): PROCALCITON, LATICACIDVEN in the last 168 hours.  Recent Results (from the past 240 hour(s))  SARS Coronavirus 2 [facility] order, Performed in [facility] hospital lab) Nasopharyngeal Nasopharyngeal Swab     Status: None   Collection Time: 10/01/18  7:57 PM   Specimen: Nasopharyngeal Swab  Result Value Ref Range Status   SARS Coronavirus 2 NEGATIVE NEGATIVE Final    Comment: (NOTE) If result is NEGATIVE SARS-CoV-2 target nucleic acids are NOT DETECTED. The SARS-CoV-2 RNA is generally detectable in upper and lower  respiratory specimens  during the acute phase of infection. The lowest  concentration of SARS-CoV-2 viral copies this assay can detect is 250  copies / mL. A negative result does not preclude SARS-CoV-2 infection  and should not be used as the sole basis for treatment or other  patient management decisions.  A negative result may occur with  improper specimen collection / handling, submission of specimen other  than nasopharyngeal swab, presence of viral mutation(s) within the  areas targeted by this assay, and inadequate number of viral copies  (<250 copies / mL). A negative result must be combined with clinical  observations, patient history, and epidemiological information. If result is POSITIVE SARS-CoV-2 target nucleic acids are DETECTED. The SARS-CoV-2 RNA is generally detectable in upper and lower  respiratory specimens dur ing the acute phase of infection.  Positive  results are indicative of active infection with  SARS-CoV-2.  Clinical  correlation with patient history and other diagnostic information is  necessary to determine patient infection status.  Positive results do  not rule out bacterial infection or co-infection with other viruses. If result is PRESUMPTIVE POSTIVE SARS-CoV-2 nucleic acids MAY BE PRESENT.   A presumptive positive result was obtained on the submitted specimen  and confirmed on repeat testing.  While 2019 novel coronavirus  (SARS-CoV-2) nucleic acids may be present in the submitted sample  additional confirmatory testing may be necessary for epidemiological  and / or clinical management purposes  to differentiate between  SARS-CoV-2 and other Sarbecovirus currently known to infect humans.  If clinically indicated additional testing with an alternate test  methodology [id number]) is advised. The SARS-CoV-2 RNA is generally  detectable in upper and lower respiratory sp ecimens during the acute  phase of infection. The expected result is Negative. Fact Sheet for Patients:   StrictlyIdeas.no Fact Sheet for Healthcare Providers: BankingDealers.co.za This test is not yet approved or cleared by the [place] FDA and has been authorized for detection and/or diagnosis of SARS-CoV-2 by FDA under an Emergency Use Authorization (EUA).  This EUA will remain in effect (meaning this test can be used) for the duration of the COVID-19 declaration under Section 564(b)(1) of the Act, 21 U.S.C. section 360bbb-3(b)(1), unless the authorization is terminated or revoked sooner. Performed at [facility], [address], [place], [place] [address]          Radiology Studies: Ct Code Stroke Cta Head W/wo Contrast  Result Date: 10/01/2018 CLINICAL DATA:  Speech impairment, right facial droop, and right hemianopia. Prior stroke. EXAM: CT ANGIOGRAPHY HEAD AND NECK CT PERFUSION BRAIN TECHNIQUE: Multidetector CT imaging of the head and neck was performed using the standard protocol during bolus administration of intravenous contrast. Multiplanar CT image reconstructions and MIPs were obtained to evaluate the vascular anatomy. Carotid stenosis measurements (when applicable) are obtained utilizing NASCET criteria, using the distal internal carotid diameter as the denominator. Multiphase CT imaging of the brain was performed following IV bolus contrast injection. Subsequent parametric perfusion maps were calculated using RAPID software. CONTRAST:  160mL OMNIPAQUE IOHEXOL 350 MG/ML SOLN COMPARISON:  Head MRA 12/19/2017.  Head and neck CTA 10/29/2017. FINDINGS: CTA NECK FINDINGS Aortic arch: Normal variant aortic arch branching with common origin of the brachiocephalic and left common carotid arteries. Mild arch atherosclerosis. Widely patent arch vessel origins. Right carotid system: Patent with mild calcified plaque in the proximal ICA. No evidence of dissection or stenosis. Left carotid system: Patent with minimal plaque in the proximal ICA. No  evidence of dissection or stenosis. Vertebral arteries: Patent without evidence of dissection or stenosis. Moderately dominant left vertebral artery. Skeleton: C3-C6 ACDF. Other neck: Diffuse heterogeneity and enlargement of the left greater than right thyroid lobes with a suspected approximately 4 cm nodule inferiorly in the left lobe extending into the superior mediastinum, similar to the prior CTA. Upper chest: Mild mosaic attenuation without apically lung consolidation or mass. Partially visualized small pleural effusions. Review of the MIP images confirms the above findings CTA HEAD FINDINGS Anterior circulation: The internal carotid arteries are patent from skull base to carotid termini with mild nonstenotic plaque bilaterally. 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 ACAs and MCAs are patent without evidence of proximal branch occlusion or significant proximal stenosis. The right A1 segment is hypoplastic. Posterior circulation: The intracranial vertebral arteries are widely patent to the basilar. A  patent left PICA, right larger than left AICAs, and bilateral SCAs are identified. 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 Posterior communicating arteries are small or absent. Both PCAs are patent without evidence of significant proximal stenosis. Venous sinuses: Patent. Anatomic variants: Hypoplastic right A1. Review of the MIP images confirms the above findings CT Brain Perfusion Findings: ASPECTS: 10 CBF (<30%) Volume: 61mL Perfusion (Tmax>6.0s) volume: 14mL although much of this reflects likely artifact in the posterior fossa Mismatch Volume: 11 mLmL though with the above caveat and no evidence of significant penumbra Infarction Location: Left frontal white matter in the centrum semiovale IMPRESSION: 1. No emergent large vessel occlusion. 2. Mild atherosclerosis in the head and neck without  significant stenosis. 3. CT perfusion imaging suggests a small infarct in the left centrum semiovale without significant penumbra. 4. 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 5.  Aortic Atherosclerosis (ICD10-I70.0). Electronically Signed   By: Logan Bores M.D.   On: 10/01/2018 21:09   Ct Code Stroke Cta Neck W/wo Contrast  Result Date: 10/01/2018 CLINICAL DATA:  Speech impairment, right facial droop, and right hemianopia. Prior stroke. EXAM: CT ANGIOGRAPHY HEAD AND NECK CT PERFUSION BRAIN TECHNIQUE: Multidetector CT imaging of the head and neck was performed using the standard protocol during bolus administration of intravenous contrast. Multiplanar CT image reconstructions and MIPs were obtained to evaluate the vascular anatomy. Carotid stenosis measurements (when applicable) are obtained utilizing NASCET criteria, using the distal internal carotid diameter as the denominator. Multiphase CT imaging of the brain was performed following IV bolus contrast injection. Subsequent parametric perfusion maps were calculated using RAPID software. CONTRAST:  115mL OMNIPAQUE IOHEXOL 350 MG/ML SOLN COMPARISON:  Head MRA 12/19/2017.  Head and neck CTA 10/29/2017. FINDINGS: CTA NECK FINDINGS Aortic arch: Normal variant aortic arch branching with common origin of the brachiocephalic and left common carotid arteries. Mild arch atherosclerosis. Widely patent arch vessel origins. Right carotid system: Patent with mild calcified plaque in the proximal ICA. No evidence of dissection or stenosis. Left carotid system: Patent with minimal plaque in the proximal ICA. No evidence of dissection or stenosis. Vertebral arteries: Patent without evidence of dissection or stenosis. Moderately dominant left vertebral artery. Skeleton: C3-C6 ACDF. Other neck: Diffuse heterogeneity and enlargement of the left greater than right thyroid lobes with a suspected approximately 4 cm nodule inferiorly  in the left lobe extending into the superior mediastinum, similar to the prior CTA. Upper chest: Mild mosaic attenuation without apically lung consolidation or mass. Partially visualized small pleural effusions. Review of the MIP images confirms the above findings CTA HEAD FINDINGS Anterior circulation: The internal carotid arteries are patent from skull base to carotid termini with mild nonstenotic plaque bilaterally. 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 ACAs and MCAs are patent without evidence of proximal branch occlusion or significant proximal stenosis. The right A1 segment is hypoplastic. Posterior circulation: The intracranial vertebral arteries are widely patent to the basilar. A patent left PICA, right larger than left AICAs, and bilateral SCAs are identified. 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 Posterior communicating arteries are small or absent. Both PCAs are patent without evidence of significant proximal stenosis. Venous sinuses: Patent. Anatomic variants: Hypoplastic right A1. Review of the MIP images confirms the above findings CT Brain Perfusion Findings:  ASPECTS: 10 CBF (<30%) Volume: 69mL Perfusion (Tmax>6.0s) volume: 39mL although much of this reflects likely artifact in the posterior fossa Mismatch Volume: 11 mLmL though with the above caveat and no evidence of significant penumbra Infarction Location: Left frontal white matter in the centrum semiovale IMPRESSION: 1. No emergent large vessel occlusion. 2. Mild atherosclerosis in the head and neck without significant stenosis. 3. CT perfusion imaging suggests a small infarct in the left centrum semiovale without significant penumbra. 4. 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 5.  Aortic Atherosclerosis (ICD10-I70.0). Electronically Signed   By: Logan Bores M.D.   On:  10/01/2018 21:09   Mr Brain Wo Contrast  Result Date: 10/02/2018 CLINICAL DATA:  Altered speech EXAM: MRI HEAD WITHOUT CONTRAST TECHNIQUE: Multiplanar, multiecho pulse sequences of the brain and surrounding structures were obtained without intravenous contrast. COMPARISON:  Code stroke from yesterday FINDINGS: Brain: Hazy area of diffusion hyperintensity in the left corona radiata that is primarily from shine through based on ADC map. This is just above an area of remote left corona radiata infarct with cystic encephalomalacia and descending cortical spinal wallerian degeneration seen into the brainstem. Chronic small vessel ischemia with confluent gliosis in the white matter. No hemorrhage, hydrocephalus, collection, or masslike finding Vascular: Major flow voids are preserved. Skull and upper cervical spine: No focal marrow lesion.  C3-4 ACDF. Sinuses/Orbits: Bilateral cataract resection. IMPRESSION: 1. No emergent finding. 2. Late subacute to chronic infarct in the left corona radiata to centrum semiovale. 3. Confluent chronic small vessel ischemia with remote left corona radiata infarct. Electronically Signed   By: Monte Fantasia M.D.   On: 10/02/2018 11:02   Ct Code Stroke Cta Cerebral Perfusion W/wo Contrast  Result Date: 10/01/2018 CLINICAL DATA:  Speech impairment, right facial droop, and right hemianopia. Prior stroke. EXAM: CT ANGIOGRAPHY HEAD AND NECK CT PERFUSION BRAIN TECHNIQUE: Multidetector CT imaging of the head and neck was performed using the standard protocol during bolus administration of intravenous contrast. Multiplanar CT image reconstructions and MIPs were obtained to evaluate the vascular anatomy. Carotid stenosis measurements (when applicable) are obtained utilizing NASCET criteria, using the distal internal carotid diameter as the denominator. Multiphase CT imaging of the brain was performed following IV bolus contrast injection. Subsequent parametric perfusion maps were  calculated using RAPID software. CONTRAST:  151mL OMNIPAQUE IOHEXOL 350 MG/ML SOLN COMPARISON:  Head MRA 12/19/2017.  Head and neck CTA 10/29/2017. FINDINGS: CTA NECK FINDINGS Aortic arch: Normal variant aortic arch branching with common origin of the brachiocephalic and left common carotid arteries. Mild arch atherosclerosis. Widely patent arch vessel origins. Right carotid system: Patent with mild calcified plaque in the proximal ICA. No evidence of dissection or stenosis. Left carotid system: Patent with minimal plaque in the proximal ICA. No evidence of dissection or stenosis. Vertebral arteries: Patent without evidence of dissection or stenosis. Moderately dominant left vertebral artery. Skeleton: C3-C6 ACDF. Other neck: Diffuse heterogeneity and enlargement of the left greater than right thyroid lobes with a suspected approximately 4 cm nodule inferiorly in the left lobe extending into the superior mediastinum, similar to the prior CTA. Upper chest: Mild mosaic attenuation without apically lung consolidation or mass. Partially visualized small pleural effusions. Review of the MIP images confirms the above findings CTA HEAD FINDINGS Anterior circulation: The internal carotid arteries are patent from skull base to carotid termini with mild nonstenotic plaque bilaterally. A 2 mm right supraclinoid ICA aneurysm in the posterior communicating region is  unchanged from the prior CTA. ACAs and MCAs are patent without evidence of proximal branch occlusion or significant proximal stenosis. The right A1 segment is hypoplastic. Posterior circulation: The intracranial vertebral arteries are widely patent to the basilar. A patent left PICA, right larger than left AICAs, and bilateral SCAs are identified. 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 Posterior communicating arteries are small or absent. Both PCAs are patent without evidence of significant proximal stenosis. Venous  sinuses: Patent. Anatomic variants: Hypoplastic right A1. Review of the MIP images confirms the above findings CT Brain Perfusion Findings: ASPECTS: 10 CBF (<30%) Volume: 58mL Perfusion (Tmax>6.0s) volume: 54mL although much of this reflects likely artifact in the posterior fossa Mismatch Volume: 11 mLmL though with the above caveat and no evidence of significant penumbra Infarction Location: Left frontal white matter in the centrum semiovale IMPRESSION: 1. No emergent large vessel occlusion. 2. Mild atherosclerosis in the head and neck without significant stenosis. 3. CT perfusion imaging suggests a small infarct in the left centrum semiovale without significant penumbra. 4. 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 5.  Aortic Atherosclerosis (ICD10-I70.0). Electronically Signed   By: Logan Bores M.D.   On: 10/01/2018 21:09   Ct Head Code Stroke Wo Contrast  Result Date: 10/01/2018 CLINICAL DATA:  Code stroke. Altered level of consciousness. Headache. History of right-sided weakness from an old stroke. EXAM: CT HEAD WITHOUT CONTRAST TECHNIQUE: Contiguous axial images were obtained from the base of the skull through the vertex without intravenous contrast. COMPARISON:  09/05/2018 FINDINGS: Brain: There is no evidence of acute infarct, intracranial hemorrhage, mass, midline shift, or extra-axial fluid collection. There is mild cerebral atrophy. A chronic infarct is again noted involving the left corona radiata and deep gray nuclei. Confluent hypodensities in the cerebral white matter bilaterally are unchanged and nonspecific but compatible with severe chronic small vessel ischemic disease. Vascular: Calcified atherosclerosis at the skull base. No hyperdense vessel. Skull: No fracture or focal osseous lesion. Sinuses/Orbits: Visualized paranasal sinuses and mastoid air cells are clear. Bilateral cataract extraction is noted. Other: None. ASPECTS [facility] Stroke  Program Early CT Score) Not scored with this history. IMPRESSION: 1. No evidence of acute intracranial abnormality. 2. Severe chronic small vessel ischemic disease. These results were called by telephone at the time of interpretation on 10/01/2018 at 8:10 pm to Dr. Roderic Palau, who verbally acknowledged these results. Electronically Signed   By: Logan Bores M.D.   On: 10/01/2018 20:10        Scheduled Meds:  apixaban  5 mg Oral BID   atorvastatin  40 mg Oral q1800   metoprolol succinate  25 mg Oral Daily   Continuous Infusions:   LOS: 1 day    Time spent: 35 mins.More than 50% of that time was spent in counseling and/or coordination of care.      Shelly Coss, MD [facility] Pager [phone]  If 7PM-7AM, please contact night-coverage [email or web address] Password [facility] 10/02/2018, 12:50 PM

## 2018-10-02 NOTE — Progress Notes (Signed)
  Echocardiogram 2D Echocardiogram has been performed.  Holly Flores 10/02/2018, 2:37 PM

## 2018-10-02 NOTE — Evaluation (Signed)
Physical Therapy Evaluation Patient Details Name: Holly Flores MRN: [id number] DOB: 1945-03-06 Today's Date: 10/02/2018   History of Present Illness  Pt is a 73 y.o. female with medical history significant for paroxysmal atrial fibrillation, diastolic CHF, COPD, hypertension, right hemiparesis from prior stroke (uses wheelchair for mobility), CKD 3, who was brought to the ED with reports of change in speech started about 3 hours prior to arrival.  Pt is currently being seen by outpatient neuro PT and OT. MRI pending.    Clinical Impression  Pt admitted with above diagnosis. On eval, pt required mod assist bed mobility and mod assist sit to stand in stedy.  Pt currently with functional limitations due to the deficits listed below (see PT Problem List). Pt will benefit from skilled PT to increase their independence and safety with mobility to allow discharge to the venue listed below.  Pt appears close to baseline for mobility. Pt active with neuro OPPT at time of admission.      Follow Up Recommendations Outpatient PT;Supervision/Assistance - 24 hour    Equipment Recommendations  None recommended by PT    Recommendations for Other Services       Precautions / Restrictions Precautions Precautions: Fall;Other (comment) Precaution Comments: R hemiplegia at baseline Restrictions Weight Bearing Restrictions: No      Mobility  Bed Mobility Overal bed mobility: Needs Assistance Bed Mobility: Rolling;Sidelying to Sit Rolling: Mod assist Sidelying to sit: Mod assist       General bed mobility comments: +rail, use of bed pad to scoot EOB  Transfers Overall transfer level: Needs assistance   Transfers: Sit to/from Stand Sit to Stand: Mod assist;+2 safety/equipment         General transfer comment: Stedy utilized for bed to recliner transfer. Cues for hand placement and sequencing.  Ambulation/Gait             General Gait Details: nonambulatory at  baseline  Stairs            Wheelchair Mobility    Modified Rankin (Stroke Patients Only)       Balance Overall balance assessment: Needs assistance Sitting-balance support: Feet supported;Single extremity supported Sitting balance-Leahy Scale: Poor Sitting balance - Comments: assist to maintain balance EOB                                     Pertinent Vitals/Pain Pain Assessment: Faces Faces Pain Scale: Hurts a little bit Pain Location: headache Pain Descriptors / Indicators: Headache Pain Intervention(s): Monitored during session    Home Living Family/patient expects to be discharged to:: Private residence Living Arrangements: Spouse/significant other;Children Available Help at Discharge: Family;Available 24 hours/day Type of Home: House Home Access: Ramped entrance     Home Layout: One level Home Equipment: Cane - single point;Walker - 2 wheels;Shower seat;Bedside commode;Wheelchair - manual;Hospital bed;Other (comment) Additional Comments: Pt awaiting Stedy for family to use for assist with transfers.    Prior Function Level of Independence: Needs assistance   Gait / Transfers Assistance Needed: Family assisting with transfers to and from w/c. 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 Self propels short distances in w/c.  ADL's / Homemaking Assistance Needed: Daughter and granddaughter assisting with ADL including bathing and dressing while husband has been in the hospital.         Hand Dominance   Dominant Hand: Right    Extremity/Trunk Assessment  Upper Extremity Assessment Upper Extremity Assessment: Defer to OT evaluation RUE Deficits / Details: Spasticity at elbow but able to achieve full PROM. No AROM throughout RUE. Increased tone reducing shoulder PROM ~0-100 degrees shoulder flexion.  RUE Sensation: (reports sensation in tact) LUE Deficits / Details: Functional throughout LUE    Lower  Extremity Assessment Lower Extremity Assessment: RLE deficits/detail RLE Deficits / Details: hemiplegia from previous CVA. Moves in synergistic pattern. 2/5 strength grossly graded.       Communication   Communication: Expressive difficulties  Cognition Arousal/Alertness: Awake/alert Behavior During Therapy: WFL for tasks assessed/performed Overall Cognitive Status: Difficult to assess                                 General Comments: Pt with expressive speech deficits throughout assessment with word finding difficulty. Overall alert and oriented (stating July initially but aware that this was not correct; difficulty with word finding for August). She is able to follow commands functionally.       General Comments      Exercises     Assessment/Plan    PT Assessment Patient needs continued PT services  PT Problem List Decreased mobility;Decreased balance;Decreased knowledge of use of DME;Pain;Decreased activity tolerance       PT Treatment Interventions DME instruction;Therapeutic activities;Therapeutic exercise;Patient/family education;Balance training;Functional mobility training;Neuromuscular re-education    PT Goals (Current goals can be found in the Care Plan section)  Acute Rehab PT Goals Patient Stated Goal: to get home PT Goal Formulation: With patient Time For Goal Achievement: 10/16/18 Potential to Achieve Goals: Good    Frequency Min 3X/week   Barriers to discharge        Co-evaluation PT/OT/SLP Co-Evaluation/Treatment: Yes Reason for Co-Treatment: Complexity of the patient's impairments (multi-system involvement);For patient/therapist safety;To address functional/ADL transfers PT goals addressed during session: Mobility/safety with mobility;Balance OT goals addressed during session: ADL's and self-care;Strengthening/ROM       AM-PAC PT "6 Clicks" Mobility  Outcome Measure Help needed turning from your back to your side while in a flat bed  without using bedrails?: A Little Help needed moving from lying on your back to sitting on the side of a flat bed without using bedrails?: A Lot Help needed moving to and from a bed to a chair (including a wheelchair)?: A Lot Help needed standing up from a chair using your arms (e.g., wheelchair or bedside chair)?: A Lot Help needed to walk in hospital room?: Total Help needed climbing 3-5 steps with a railing? : Total 6 Click Score: 11    End of Session Equipment Utilized During Treatment: Gait belt Activity Tolerance: Patient tolerated treatment well Patient left: in chair;with call bell/phone within reach Nurse Communication: Mobility status PT Visit Diagnosis: Other abnormalities of gait and mobility (R26.89);Hemiplegia and hemiparesis Hemiplegia - Right/Left: Right Hemiplegia - dominant/non-dominant: Dominant Hemiplegia - caused by: Nontraumatic intracerebral hemorrhage    Time: 1610-9604 PT Time Calculation (min) (ACUTE ONLY): 19 min   Charges:   PT Evaluation $PT Eval Moderate Complexity: 1 Mod          Lorrin Goodell, PT  Office # [phone] Pager [phone]   Lorriane Shire 10/02/2018, 9:51 AM

## 2018-10-02 NOTE — Evaluation (Signed)
Occupational Therapy Evaluation Patient Details Name: Holly Flores MRN: [id number] DOB: 04/01/1945 Today's Date: 10/02/2018    History of Present Illness Pt is a 73 y.o. female with medical history significant for paroxysmal atrial fibrillation, diastolic CHF, COPD, hypertension, right hemiparesis from prior stroke (uses wheelchair for mobility), CKD 3, who was brought to the ED with reports of change in speech started about 3 hours prior to arrival.  Pt is currently being seen by outpatient neuro PT and OT. MRI pending.   Clinical Impression   PTA, pt was participating in outpatient OT and was using wheelchair for mobility. She has assistance from family for dressing and bathing. Her husband is receiving cancer treatment and pt has been under significant stress. She currently requires maximum assistance for UB ADL, total assistance for LB ADL, and is able to complete sit<>stand to [facility] with moderate assistance for toilet transfers. She would benefit from continued OT services while admitted to improve independence and safety with ADL and mobility. As long as family is able to continue providing 24/7 assistance, recommend return home and back to outpatient rehabilitation.    Follow Up Recommendations  Supervision/Assistance - 24 hour;Outpatient OT(continue with outpatient OT as long as family able to assist)    Equipment Recommendations  None recommended by OT    Recommendations for Other Services       Precautions / Restrictions Precautions Precautions: Fall Precaution Comments: R hemiplegia at baseline Restrictions Weight Bearing Restrictions: No      Mobility Bed Mobility Overal bed mobility: Needs Assistance Bed Mobility: Rolling;Sidelying to Sit Rolling: Mod assist Sidelying to sit: Mod assist       General bed mobility comments: Overall moderate assistance to assume standing position.   Transfers Overall transfer level: Needs assistance   Transfers: Sit to/from  Stand Sit to Stand: Mod assist;+2 safety/equipment         General transfer comment: +2 assist available today. Pt able to complete transfer with Stedy with moderate assist.     Balance Overall balance assessment: Needs assistance Sitting-balance support: Feet supported;Single extremity supported Sitting balance-Leahy Scale: Poor Sitting balance - Comments: Required min guard to moderate assistance. Unable to return to midline once R lateral lean                                   ADL either performed or assessed with clinical judgement   ADL Overall ADL's : Needs assistance/impaired Eating/Feeding: Set up;Supervision/ safety;Sitting Eating/Feeding Details (indicate cue type and reason): set-up for containers, etc Grooming: Supervision/safety;Set up;Sitting Grooming Details (indicate cue type and reason): set-up for opening bottles, etc Upper Body Bathing: Maximal assistance;Sitting   Lower Body Bathing: Total assistance;Sit to/from stand   Upper Body Dressing : Maximal assistance;Sitting   Lower Body Dressing: Total assistance;Sit to/from stand   Toilet Transfer: Moderate assistance(with Stedy) Armed forces technical officer Details (indicate cue type and reason): pt reports continence Toileting- Clothing Manipulation and Hygiene: Total assistance;Sit to/from stand         General ADL Comments: Pt demonstrating ability to utilize [place] with moderate assistance to power up to standing. Nursing made aware that she can use BSC with assist from staff using Stedy.      Vision Patient Visual Report: No change from baseline Vision Assessment?: Vision impaired- to be further tested in functional context Additional Comments: Noted dysconjugate gaze as well as difficulty with fixation.      Perception  Praxis      Pertinent Vitals/Pain Pain Assessment: Faces Faces Pain Scale: Hurts a little bit Pain Location: headache Pain Descriptors / Indicators: Headache Pain  Intervention(s): Limited activity within patient's tolerance     Hand Dominance Right   Extremity/Trunk Assessment Upper Extremity Assessment Upper Extremity Assessment: RUE deficits/detail;LUE deficits/detail RUE Deficits / Details: Spasticity at elbow but able to achieve full PROM. No AROM throughout RUE. Increased tone reducing shoulder PROM ~0-100 degrees shoulder flexion.  RUE Sensation: (reports sensation in tact) LUE Deficits / Details: Functional throughout LUE   Lower Extremity Assessment Lower Extremity Assessment: Defer to PT evaluation       Communication Communication Communication: Expressive difficulties   Cognition Arousal/Alertness: Awake/alert Behavior During Therapy: WFL for tasks assessed/performed Overall Cognitive Status: Difficult to assess                                 General Comments: Pt with expressive speech deficits throughout assessment with word finding difficulty. Overall alert and oriented (stating July initially but aware that this was not correct; difficulty with word finding for August). She is able to follow commands functionally.    General Comments       Exercises     Shoulder Instructions      Home Living Family/patient expects to be discharged to:: Private residence Living Arrangements: Spouse/significant other;Children Available Help at Discharge: Family;Available 24 hours/day(daughter; grand-daughter) Type of Home: House Home Access: Ramped entrance     Home Layout: One level     Bathroom Shower/Tub: Occupational psychologist: [place] - single point;Walker - 2 wheels;Shower seat;Bedside commode;Wheelchair - manual;Hospital bed;Other (comment)   Additional Comments: Pt awaiting Stedy for family to use for assist with transfers.      Prior Functioning/Environment Level of Independence: Needs assistance  Gait / Transfers Assistance Needed: Family assisting with transfers  to and from w/c. 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  ADL's / Homemaking Assistance Needed: Daughter and granddaughter assisting with ADL including bathing and dressing while husband has been in the hospital.             OT Problem List: Decreased strength;Decreased range of motion;Decreased activity tolerance;Impaired balance (sitting and/or standing);Impaired vision/perception;Decreased coordination;Decreased cognition;Decreased safety awareness;Decreased knowledge of use of DME or AE;Decreased knowledge of precautions;Pain;Impaired UE functional use      OT Treatment/Interventions: Self-care/ADL training;Therapeutic exercise;Energy conservation;DME and/or AE instruction;Therapeutic activities;Patient/family education;Balance training;Visual/perceptual remediation/compensation;Cognitive remediation/compensation;Neuromuscular education    OT Goals(Current goals can be found in the care plan section) Acute Rehab OT Goals Patient Stated Goal: to get home OT Goal Formulation: With patient Time For Goal Achievement: 10/16/18 Potential to Achieve Goals: Good ADL Goals Pt Will Perform Grooming: with modified independence;sitting;with adaptive equipment Pt Will Perform Upper Body Dressing: with min assist;with adaptive equipment;sitting Pt Will Transfer to Toilet: with max assist;stand pivot transfer;bedside commode Pt Will Perform Toileting - Clothing Manipulation and hygiene: with max assist;sit to/from stand Pt/caregiver will Perform Home Exercise Program: Right Upper extremity;Increased ROM;With written HEP provided  OT Frequency: Min 2X/week   Barriers to D/C:            Co-evaluation PT/OT/SLP Co-Evaluation/Treatment: Yes Reason for Co-Treatment: Complexity of the patient's impairments (multi-system involvement);For patient/therapist safety;To address functional/ADL transfers   OT goals addressed during session: ADL's and  self-care;Strengthening/ROM      AM-PAC OT "6 Clicks"  Daily Activity     Outcome Measure Help from another person eating meals?: A Little Help from another person taking care of personal grooming?: A Little Help from another person toileting, which includes using toliet, bedpan, or urinal?: Total Help from another person bathing (including washing, rinsing, drying)?: Total Help from another person to put on and taking off regular upper body clothing?: A Lot Help from another person to put on and taking off regular lower body clothing?: Total 6 Click Score: 11   End of Session Equipment Utilized During Treatment: Gait belt(Stedy) Nurse Communication: Mobility status  Activity Tolerance: Patient tolerated treatment well Patient left: in chair;with call bell/phone within reach  OT Visit Diagnosis: Other abnormalities of gait and mobility (R26.89);Other symptoms and signs involving cognitive function;Hemiplegia and hemiparesis Hemiplegia - Right/Left: Right Hemiplegia - dominant/non-dominant: Dominant Hemiplegia - caused by: Cerebral infarction                Time: 4970-2637 OT Time Calculation (min): 20 min Charges:  OT General Charges $OT Visit: 1 Visit OT Evaluation $OT Eval Moderate Complexity: [place] A Zacary Bauer 10/02/2018, 9:19 AM

## 2018-10-02 NOTE — Progress Notes (Signed)
SLP Cancellation Note  Patient Details Name: KAYLINN DEDIC MRN: [id number] DOB: December 26, 1945   Cancelled treatment:       Reason Eval/Treat Not Completed: Patient at procedure or test/unavailable(Pt off unit for MRI. SLP will follow up. )  Laquasha Groome I. Hardin Negus, [place], [place] Office number [phone] Pager [phone]  Horton Marshall 10/02/2018, 10:37 AM

## 2018-10-03 ENCOUNTER — Inpatient Hospital Stay (HOSPITAL_COMMUNITY): Payer: Medicare Other

## 2018-10-03 DIAGNOSIS — R569 Unspecified convulsions: Secondary | ICD-10-CM

## 2018-10-03 LAB — URINE CULTURE: Culture: NO GROWTH

## 2018-10-03 NOTE — Progress Notes (Signed)
STROKE PROGRESS NOTE    Subjective :  Patient states she is noticed slight improvement in her strength on the right side and is able to bend her fingers in the right hand as well as move her leg.  She still has significant weakness.  She has had no weakness seizure activity.  EEG is being presently done at the bedside shows focal left hemispheric slowing but no definite epileptiform activity is noted..  2D echo done yesterday showed normal ejection fraction without cardiac source of embolism.  LABORATORY STUDIES:  Basic Metabolic Panel:  Last Labs                                                        Liver Function Tests:  Last Labs                                       Last Labs      Last Labs     CBC:  Last Labs                                              Cardiac Enzymes:  Last Labs     BNP:  Last Labs     CBG:  Last Labs                  Microbiology:  Coagulation Studies:  Recent Labs (last 2 labs)                      Urinalysis:  Last Labs                                                      Lipid Panel:  Labs (Brief)                                                    HgbA1C:  Recent Labs                      Urine Drug Screen:  Labs (Brief)                                                    Alcohol Level:  Last Labs                  Miscellaneous labs:  EKG  EKG - SB rate 59 BPM. (See cardiology reading for complete details) IMAGING:  Ct Code Stroke Cta Head W/wo Contrast  Ct Code Stroke Cta Neck W/wo Contrast  Ct Code Stroke Cta Cerebral Perfusion W/wo Contrast  10/01/2018  IMPRESSION:  1. No emergent large vessel occlusion.  2. Mild atherosclerosis in the head and neck without significant stenosis.  3. CT perfusion imaging suggests a small infarct in the left centrum semiovale without significant penumbra.  4. 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  5. Aortic Atherosclerosis (ICD10-I70.0).  Ct Head Code Stroke Wo Contrast  10/01/2018  IMPRESSION:  1. No evidence of acute intracranial abnormality.  2. Severe chronic small vessel ischemic disease.  3. ASPECTS ([facility] Stroke Program Early CT Score) Not scored with this history.  MRI Head WO Contrast -no acute infarct. 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  Transthoracic Echocardiogram - pending  Assessment: 73 y.o. female with a history of PAF (on Eliquis), previous Lt CVA (Sept 2019) with residual Rt sided weakness, central retinal vein occlusion OS 2016, CHF, Htn, Hld, CKD, COPD, migraines, seizure (Feb 2020), OSA, anxiety, depression and fibromyalgia who presented to the ED at [facility] on 10/01/2018 with speech difficulties (speech not making sense per son) associated with lethargy, headache, right facial droop and right hemianopia. Not a tPA candidate secondary to anticoagulation.  Stroke: small infarct in the left centrum semiovale likely embolic secondary to PAF despite Eliquis on CT perfusion but not verified on MRI. Patient likely had worsening of old deficits from previous subacute infarct anxiety etiology indeterminate unwitnessed seizure and postictal deficit probably.  Resultant worsening of pre-existing right hemiparesis  CT head - No evidence of acute intracranial abnormality. Severe chronic small vessel ischemic disease. ASPECTS [facility] Stroke Program Early CT Score) not scored with this history.  MRI head -no acute infarct.  Late subacute to chronic left centrum semiovale infarct. MRA head - not ordered  CTA H&N - No emergent large vessel occlusion. 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  CT Perfusion - suggests a small infarct in the left centrum semiovale without significant penumbra.  Carotid Doppler - CTA neck performed - carotid dopplers not indicated.  2D Echo  -normal ejection fraction.  No cardiac source of embolism. EEG left hemispheric slowing no definite epileptiform activity. Sars Corona Virus 2 - negative  LDL - 106  HgbA1c - 5.1  UDS - benzodiazepine (on Rx Xanax PTA)  VTE prophylaxis - SCDs  Diet - Heart healthy with thin liquids.  Eliquis (apixaban) daily prior to admission, now on aspirin 325 mg daily  Patient counseled to be compliant with her antithrombotic medications  Ongoing aggressive stroke risk factor management  Therapy recommendations: Outpt PT and OT  Disposition: Pending Hypertension  Stable Permissive hypertension but gradually normalize in 5-7 days  Avoid extreme high BP secondary to small right PCA and ICA aneurysms.  Long-term BP goal normotensive Hyperlipidemia  Lipid lowering medication PTA: none  LDL 106, goal < 70  Current lipid lowering medication: Lipitor 40 mg daily  Continue statin at discharge Other Stroke Risk Factors  Advanced age  Former cigarette smoker - quit  Previous ETOH use - advised to drink no more than 1 alcoholic beverage per day.  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  resume Eliquis Mobilize out of bed.Continue ongoing therapies Stroke team will sign off. Call  for questions.Marland Kitchen Marland KitchenDiscussed with patient and Dr. Tamsen Meek. [name], MD  Medical Director  [facility]  Pager: [phone]  10/02/2018 12:47 PM

## 2018-10-03 NOTE — Plan of Care (Signed)
  Problem: Education: Goal: Knowledge of disease or condition will improve Outcome: Progressing Goal: Knowledge of secondary prevention will improve Outcome: Progressing Goal: Knowledge of patient specific risk factors addressed and post discharge goals established will improve Outcome: Progressing   Problem: Coping: Goal: Will verbalize positive feelings about self Outcome: Progressing Goal: Will identify appropriate support needs Outcome: Progressing   Problem: Health Behavior/Discharge Planning: Goal: Ability to manage health-related needs will improve Outcome: Progressing   Problem: Self-Care: Goal: Ability to participate in self-care as condition permits will improve Outcome: Progressing Goal: Verbalization of feelings and concerns over difficulty with self-care will improve Outcome: Progressing Goal: Ability to communicate needs accurately will improve Outcome: Progressing   Problem: Nutrition: Goal: Risk of aspiration will decrease Outcome: Progressing Goal: Dietary intake will improve Outcome: Progressing   Problem: Intracerebral Hemorrhage Tissue Perfusion: Goal: Complications of Intracerebral Hemorrhage will be minimized Outcome: Progressing   Problem: Ischemic Stroke/TIA Tissue Perfusion: Goal: Complications of ischemic stroke/TIA will be minimized Outcome: Progressing   Problem: Spontaneous Subarachnoid Hemorrhage Tissue Perfusion: Goal: Complications of Spontaneous Subarachnoid Hemorrhage will be minimized Outcome: Progressing   Problem: Education: Goal: Knowledge of General Education information will improve Description: Including pain rating scale, medication(s)/side effects and non-pharmacologic comfort measures Outcome: Progressing   Problem: Health Behavior/Discharge Planning: Goal: Ability to manage health-related needs will improve Outcome: Progressing   Problem: Clinical Measurements: Goal: Ability to maintain clinical measurements within  normal limits will improve Outcome: Progressing Goal: Will remain free from infection Outcome: Progressing Goal: Diagnostic test results will improve Outcome: Progressing Goal: Respiratory complications will improve Outcome: Progressing Goal: Cardiovascular complication will be avoided Outcome: Progressing   Problem: Activity: Goal: Risk for activity intolerance will decrease Outcome: Progressing   Problem: Nutrition: Goal: Adequate nutrition will be maintained Outcome: Progressing   Problem: Coping: Goal: Level of anxiety will decrease Outcome: Progressing   Problem: Elimination: Goal: Will not experience complications related to bowel motility Outcome: Progressing Goal: Will not experience complications related to urinary retention Outcome: Progressing   Problem: Pain Managment: Goal: General experience of comfort will improve Outcome: Progressing   Problem: Safety: Goal: Ability to remain free from injury will improve Outcome: Progressing   Problem: Skin Integrity: Goal: Risk for impaired skin integrity will decrease Outcome: Progressing   Ival Bible, BSN, RN

## 2018-10-03 NOTE — TOC Transition Note (Addendum)
Transition of Care [facility]) - CM/SW Discharge Note   Patient Details  Name: Holly Flores MRN: [id number] Date of Birth: Jun 01, 1945  [facility] of [facility] CM/SW Contact:  Sharin Mons, RN Phone Number: 10/03/2018, 5:32 PM   Clinical Narrative:  Suspect Acute CVA (cerebrovascular accident. Hx of paroxysmal A. fib, diastolic CHF, COPD, hypertension, right hemiparesis from prior stroke, CKD stage III  Transition to home today with  Outpatient PT,SLP follow up. From home with husband. Supportive family ( son/daughter).     Final next level of care: Home/Self Care Barriers to Discharge: No Barriers Identified   Patient Goals and CMS Choice Patient states their goals for this hospitalization and ongoing recovery are:: to go home      Discharge Placement                       Discharge Plan and Services   Discharge Planning Services: CM Consult,  Outpatient PT,SLP therapy arranged with [facility]. Post Acute Care Choice: NA          DME Arranged: N/A DME Agency: NA         HH Agency: NA        Social Determinants of Health (SDOH) Interventions     Readmission Risk Interventions No flowsheet data found.

## 2018-10-03 NOTE — Procedures (Signed)
Patient Name: NATIYA SEELINGER  MRN: [id number]  Epilepsy Attending: Lora Havens  Referring Physician/Provider: Dr [name] Date: 10/03/2018 Duration: 24.45 mins  Patient history: 73 year old female presenting with seizure-like episode.  EEG to evaluate for seizure.   Level of alertness: awake, asleep  Technical aspects: This EEG study was done with scalp electrodes positioned according to the 10-20 International system of electrode placement. Electrical activity was acquired at a sampling rate of 500Hz  and reviewed with a high frequency filter of 70Hz  and a low frequency filter of 1Hz . EEG data were recorded continuously and digitally stored.   DESCRIPTION:  The posterior dominant rhythm consists of 8-9 Hz activity of moderate voltage (25-35 uV) seen predominantly in posterior head regions, symmetric and reactive to eye opening and eye closing.  Sleep was characterized by vertex waves, maximal frontocentral. There was also continuous left hemispheric, maximal left temporal lobe with2-3Hz  rhythmic delta slowing, at times sharply contoured. Hyperventilation and photic stimulation were not performed.  IMPRESSION: This study is suggestive of cortical dysfunction in left hemisphere, maximal left temporal region. No seizures or clear epileptiform discharges were seen throughout the recording.  Chakia Counts Barbra Sarks

## 2018-10-03 NOTE — Discharge Summary (Signed)
Physician Discharge Summary  Holly Flores ZOX:[id number] DOB: [birth year] DOA: 10/01/2018  PCP: Octavio Graves, DO (Inactive)  Admit date: 10/01/2018 Discharge date: 10/03/2018  Admitted From: Home Disposition:  Home  Discharge Condition:Stable CODE STATUS:FULL Diet recommendation: Heart Healthy   Brief/Interim Summary: Patient is a 73 year old female with history of paroxysmal A. fib, diastolic CHF, COPD, hypertension, right hemiparesis from prior stroke, CKD stage III who was brought from home with complaints of inability to speak, headache.  At baseline, patient ambulates with the help of wheelchair due to right-sided hemiparesis.  MRI done on this admission does not show any acute intracranial abnormalities.    EEG did not show any epileptiform activities.  Patient evaluated PT is and recommended outpatient follow-up.  Neurology was following during this hospitalization. She is hemodynamically stable for discharge to home today.  Following problems were addressed during her hospitalization:  Suspected stroke/ confusion /aphasia: MRI of the brain does not show any acute stroke.  This morning her speech was clear and fluent.  Denies any headache.  Alert and oriented.   EEG did not show epileptiform activities.  History of stroke: History of stroke a year ago.  Has dense right-sided hemiplegia.  Wheelchair-bound.  Continue current medicines. She is seen by PT/OT and recommended outpatient follow up on discharge.  Paroxysmal A. fib: Currently in sinus rhythm.  Rate is controlled.  Continue Eliquis for anticoagulation.  Resume metoprolol  Hypertension: Continue current medicines.  Currently blood pressure acceptable.  Diastolic CHF: Currently stable and compensated.  Echo showed ejection fraction of 60 to 65%.  On torsemide at home.  CKD stage III: Creatinine at baseline.   Discharge Diagnoses:  Active Problems:   Acute CVA (cerebrovascular accident) [facility])    Discharge  Instructions  Discharge Instructions    Diet - low sodium heart healthy   Complete by: As directed    Discharge instructions   Complete by: As directed    1)Follow up with your PCP in a week. 2)Follow up with your neurologist as an outpatient.   Increase activity slowly   Complete by: As directed      Allergies as of 10/03/2018      Reactions   Penicillins Hives, Itching, Rash   Has patient had a PCN reaction causing immediate rash, facial/tongue/throat swelling, SOB or lightheadedness with hypotension: Yes Has patient had a PCN reaction causing severe rash involving mucus membranes or skin necrosis: Yes Has patient had a PCN reaction that required hospitalization: No Has patient had a PCN reaction occurring within the last 10 years: No If all of the above answers are "NO", then may proceed with Cephalosporin use.   Sulfa Antibiotics Swelling   Tongue swelling      Medication List    TAKE these medications   albuterol 108 (90 Base) MCG/ACT inhaler Commonly known as: VENTOLIN HFA Inhale 2 puffs into the lungs every 6 (six) hours as needed (for wheezing/shortness of breath). What changed: when to take this   ALPRAZolam 1 MG tablet Commonly known as: XANAX Take 1 mg by mouth 2 (two) times daily as needed for anxiety.   apixaban 5 MG Tabs tablet Commonly known as: Eliquis Take 1 tablet (5 mg total) by mouth 2 (two) times daily.   cyclobenzaprine 5 MG tablet Commonly known as: FLEXERIL Take 1 tablet (5 mg total) by mouth at bedtime.   hydroxypropyl methylcellulose / hypromellose 2.5 % ophthalmic solution Commonly known as: ISOPTO TEARS / GONIOVISC Place 1 drop into both eyes as  needed for dry eyes.   metoprolol succinate 25 MG 24 hr tablet Commonly known as: TOPROL-XL Take 25 mg by mouth 2 (two) times a day.   naproxen 500 MG tablet Commonly known as: NAPROSYN Take 500 mg by mouth 2 (two) times daily as needed for mild pain or moderate pain (with food/milk).    potassium chloride SA 20 MEQ tablet Commonly known as: K-DUR Take 1 tablet (20 mEq total) by mouth daily. What changed: when to take this      Follow-up Information    Octavio Graves, DO. Schedule an appointment as soon as possible for a visit in 1 week(s).   Contact information: [address] [place] [address] [phone]          Allergies  Allergen Reactions  . Penicillins Hives, Itching and Rash    Has patient had a PCN reaction causing immediate rash, facial/tongue/throat swelling, SOB or lightheadedness with hypotension: Yes Has patient had a PCN reaction causing severe rash involving mucus membranes or skin necrosis: Yes Has patient had a PCN reaction that required hospitalization: No Has patient had a PCN reaction occurring within the last 10 years: No If all of the above answers are "NO", then may proceed with Cephalosporin use.   . Sulfa Antibiotics Swelling    Tongue swelling    Consultations:  neurology   Procedures/Studies: Ct Code Stroke Cta Head W/wo Contrast  Result Date: 10/01/2018 CLINICAL DATA:  Speech impairment, right facial droop, and right hemianopia. Prior stroke. EXAM: CT ANGIOGRAPHY HEAD AND NECK CT PERFUSION BRAIN TECHNIQUE: Multidetector CT imaging of the head and neck was performed using the standard protocol during bolus administration of intravenous contrast. Multiplanar CT image reconstructions and MIPs were obtained to evaluate the vascular anatomy. Carotid stenosis measurements (when applicable) are obtained utilizing NASCET criteria, using the distal internal carotid diameter as the denominator. Multiphase CT imaging of the brain was performed following IV bolus contrast injection. Subsequent parametric perfusion maps were calculated using RAPID software. CONTRAST:  177mL OMNIPAQUE IOHEXOL 350 MG/ML SOLN COMPARISON:  Head MRA 12/19/2017.  Head and neck CTA 10/29/2017. FINDINGS: CTA NECK FINDINGS Aortic arch: Normal variant aortic  arch branching with common origin of the brachiocephalic and left common carotid arteries. Mild arch atherosclerosis. Widely patent arch vessel origins. Right carotid system: Patent with mild calcified plaque in the proximal ICA. No evidence of dissection or stenosis. Left carotid system: Patent with minimal plaque in the proximal ICA. No evidence of dissection or stenosis. Vertebral arteries: Patent without evidence of dissection or stenosis. Moderately dominant left vertebral artery. Skeleton: C3-C6 ACDF. Other neck: Diffuse heterogeneity and enlargement of the left greater than right thyroid lobes with a suspected approximately 4 cm nodule inferiorly in the left lobe extending into the superior mediastinum, similar to the prior CTA. Upper chest: Mild mosaic attenuation without apically lung consolidation or mass. Partially visualized small pleural effusions. Review of the MIP images confirms the above findings CTA HEAD FINDINGS Anterior circulation: The internal carotid arteries are patent from skull base to carotid termini with mild nonstenotic plaque bilaterally. 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 ACAs and MCAs are patent without evidence of proximal branch occlusion or significant proximal stenosis. The right A1 segment is hypoplastic. Posterior circulation: The intracranial vertebral arteries are widely patent to the basilar. A patent left PICA, right larger than left AICAs, and bilateral SCAs are identified. 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 Posterior communicating arteries are small or absent. Both PCAs are patent without evidence of significant proximal stenosis. Venous sinuses: Patent. Anatomic variants: Hypoplastic right A1. Review of the MIP images confirms the above findings CT Brain Perfusion Findings: ASPECTS: 10 CBF (<30%) Volume: 66mL Perfusion (Tmax>6.0s) volume: 58mL  although much of this reflects likely artifact in the posterior fossa Mismatch Volume: 11 mLmL though with the above caveat and no evidence of significant penumbra Infarction Location: Left frontal white matter in the centrum semiovale IMPRESSION: 1. No emergent large vessel occlusion. 2. Mild atherosclerosis in the head and neck without significant stenosis. 3. CT perfusion imaging suggests a small infarct in the left centrum semiovale without significant penumbra. 4. 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 5.  Aortic Atherosclerosis (ICD10-I70.0). Electronically Signed   By: Logan Bores M.D.   On: 10/01/2018 21:09   Ct Head Wo Contrast  Result Date: 09/05/2018 CLINICAL DATA:  Acute, severe headache, the worst of the patient's life. EXAM: CT HEAD WITHOUT CONTRAST TECHNIQUE: Contiguous axial images were obtained from the base of the skull through the vertex without intravenous contrast. COMPARISON:  03/10/2018. FINDINGS: Brain: Stable mildly enlarged ventricles and cortical sulci. Stable marked patchy white matter low density in both cerebral hemispheres. Stable old left basal ganglia and thalamic infarct. No intracranial hemorrhage, mass lesion or CT evidence of acute infarction. Vascular: No hyperdense vessel or unexpected calcification. Skull: Normal. Negative for fracture or focal lesion. Sinuses/Orbits: Status post bilateral cataract extraction. Unremarkable bones and included paranasal sinuses. Other: None. IMPRESSION: 1. No acute abnormality. 2. Stable atrophy, chronic small vessel white matter ischemic changes and old left basal ganglia and thalamic infarct. Electronically Signed   By: Claudie Revering M.D.   On: 09/05/2018 15:06   Ct Code Stroke Cta Neck W/wo Contrast  Result Date: 10/01/2018 CLINICAL DATA:  Speech impairment, right facial droop, and right hemianopia. Prior stroke. EXAM: CT ANGIOGRAPHY HEAD AND NECK CT PERFUSION BRAIN TECHNIQUE:  Multidetector CT imaging of the head and neck was performed using the standard protocol during bolus administration of intravenous contrast. Multiplanar CT image reconstructions and MIPs were obtained to evaluate the vascular anatomy. Carotid stenosis measurements (when applicable) are obtained utilizing NASCET criteria, using the distal internal carotid diameter as the denominator. Multiphase CT imaging of the brain was performed following IV bolus contrast injection. Subsequent parametric perfusion maps were calculated using RAPID software. CONTRAST:  142mL OMNIPAQUE IOHEXOL 350 MG/ML SOLN COMPARISON:  Head MRA 12/19/2017.  Head and neck CTA 10/29/2017. FINDINGS: CTA NECK FINDINGS Aortic arch: Normal variant aortic arch branching with common origin of the brachiocephalic and left common carotid arteries. Mild arch atherosclerosis. Widely patent arch vessel origins. Right carotid system: Patent with mild calcified plaque in the proximal ICA. No evidence of dissection or stenosis. Left carotid system: Patent with minimal plaque in the proximal ICA. No evidence of dissection or stenosis. Vertebral arteries: Patent without evidence of dissection or stenosis. Moderately dominant left vertebral artery. Skeleton: C3-C6 ACDF. Other neck: Diffuse heterogeneity and enlargement of the left greater than right thyroid lobes with a suspected approximately 4 cm nodule inferiorly in the left lobe extending into the superior mediastinum, similar to the prior CTA. Upper chest: Mild mosaic attenuation without apically lung consolidation or mass. Partially visualized small pleural effusions. Review of the MIP images confirms the above findings CTA HEAD FINDINGS Anterior circulation: The internal  carotid arteries are patent from skull base to carotid termini with mild nonstenotic plaque bilaterally. 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 ACAs and MCAs are patent  without evidence of proximal branch occlusion or significant proximal stenosis. The right A1 segment is hypoplastic. Posterior circulation: The intracranial vertebral arteries are widely patent to the basilar. A patent left PICA, right larger than left AICAs, and bilateral SCAs are identified. 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 Posterior communicating arteries are small or absent. Both PCAs are patent without evidence of significant proximal stenosis. Venous sinuses: Patent. Anatomic variants: Hypoplastic right A1. Review of the MIP images confirms the above findings CT Brain Perfusion Findings: ASPECTS: 10 CBF (<30%) Volume: 57mL Perfusion (Tmax>6.0s) volume: 13mL although much of this reflects likely artifact in the posterior fossa Mismatch Volume: 11 mLmL though with the above caveat and no evidence of significant penumbra Infarction Location: Left frontal white matter in the centrum semiovale IMPRESSION: 1. No emergent large vessel occlusion. 2. Mild atherosclerosis in the head and neck without significant stenosis. 3. CT perfusion imaging suggests a small infarct in the left centrum semiovale without significant penumbra. 4. 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 5.  Aortic Atherosclerosis (ICD10-I70.0). Electronically Signed   By: Logan Bores M.D.   On: 10/01/2018 21:09   Mr Brain Wo Contrast  Result Date: 10/02/2018 CLINICAL DATA:  Altered speech EXAM: MRI HEAD WITHOUT CONTRAST TECHNIQUE: Multiplanar, multiecho pulse sequences of the brain and surrounding structures were obtained without intravenous contrast. COMPARISON:  Code stroke from yesterday FINDINGS: Brain: Hazy area of diffusion hyperintensity in the left corona radiata that is primarily from shine through based on ADC map. This is just above an area of remote left corona radiata infarct with cystic encephalomalacia and descending cortical  spinal wallerian degeneration seen into the brainstem. Chronic small vessel ischemia with confluent gliosis in the white matter. No hemorrhage, hydrocephalus, collection, or masslike finding Vascular: Major flow voids are preserved. Skull and upper cervical spine: No focal marrow lesion.  C3-4 ACDF. Sinuses/Orbits: Bilateral cataract resection. IMPRESSION: 1. No emergent finding. 2. Late subacute to chronic infarct in the left corona radiata to centrum semiovale. 3. Confluent chronic small vessel ischemia with remote left corona radiata infarct. Electronically Signed   By: Monte Fantasia M.D.   On: 10/02/2018 11:02   Ct Code Stroke Cta Cerebral Perfusion W/wo Contrast  Result Date: 10/01/2018 CLINICAL DATA:  Speech impairment, right facial droop, and right hemianopia. Prior stroke. EXAM: CT ANGIOGRAPHY HEAD AND NECK CT PERFUSION BRAIN TECHNIQUE: Multidetector CT imaging of the head and neck was performed using the standard protocol during bolus administration of intravenous contrast. Multiplanar CT image reconstructions and MIPs were obtained to evaluate the vascular anatomy. Carotid stenosis measurements (when applicable) are obtained utilizing NASCET criteria, using the distal internal carotid diameter as the denominator. Multiphase CT imaging of the brain was performed following IV bolus contrast injection. Subsequent parametric perfusion maps were calculated using RAPID software. CONTRAST:  17mL OMNIPAQUE IOHEXOL 350 MG/ML SOLN COMPARISON:  Head MRA 12/19/2017.  Head and neck CTA 10/29/2017. FINDINGS: CTA NECK FINDINGS Aortic arch: Normal variant aortic arch branching with common origin of the brachiocephalic and left common carotid arteries. Mild arch atherosclerosis. Widely patent arch vessel origins. Right carotid system: Patent with mild calcified plaque in the proximal ICA. No  evidence of dissection or stenosis. Left carotid system: Patent with minimal plaque in the proximal ICA. No evidence of  dissection or stenosis. Vertebral arteries: Patent without evidence of dissection or stenosis. Moderately dominant left vertebral artery. Skeleton: C3-C6 ACDF. Other neck: Diffuse heterogeneity and enlargement of the left greater than right thyroid lobes with a suspected approximately 4 cm nodule inferiorly in the left lobe extending into the superior mediastinum, similar to the prior CTA. Upper chest: Mild mosaic attenuation without apically lung consolidation or mass. Partially visualized small pleural effusions. Review of the MIP images confirms the above findings CTA HEAD FINDINGS Anterior circulation: The internal carotid arteries are patent from skull base to carotid termini with mild nonstenotic plaque bilaterally. 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 ACAs and MCAs are patent without evidence of proximal branch occlusion or significant proximal stenosis. The right A1 segment is hypoplastic. Posterior circulation: The intracranial vertebral arteries are widely patent to the basilar. A patent left PICA, right larger than left AICAs, and bilateral SCAs are identified. 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 Posterior communicating arteries are small or absent. Both PCAs are patent without evidence of significant proximal stenosis. Venous sinuses: Patent. Anatomic variants: Hypoplastic right A1. Review of the MIP images confirms the above findings CT Brain Perfusion Findings: ASPECTS: 10 CBF (<30%) Volume: 25mL Perfusion (Tmax>6.0s) volume: 52mL although much of this reflects likely artifact in the posterior fossa Mismatch Volume: 11 mLmL though with the above caveat and no evidence of significant penumbra Infarction Location: Left frontal white matter in the centrum semiovale IMPRESSION: 1. No emergent large vessel occlusion. 2. Mild atherosclerosis in the head and neck without significant  stenosis. 3. CT perfusion imaging suggests a small infarct in the left centrum semiovale without significant penumbra. 4. 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 5.  Aortic Atherosclerosis (ICD10-I70.0). Electronically Signed   By: Logan Bores M.D.   On: 10/01/2018 21:09   Ct Head Code Stroke Wo Contrast  Result Date: 10/01/2018 CLINICAL DATA:  Code stroke. Altered level of consciousness. Headache. History of right-sided weakness from an old stroke. EXAM: CT HEAD WITHOUT CONTRAST TECHNIQUE: Contiguous axial images were obtained from the base of the skull through the vertex without intravenous contrast. COMPARISON:  09/05/2018 FINDINGS: Brain: There is no evidence of acute infarct, intracranial hemorrhage, mass, midline shift, or extra-axial fluid collection. There is mild cerebral atrophy. A chronic infarct is again noted involving the left corona radiata and deep gray nuclei. Confluent hypodensities in the cerebral white matter bilaterally are unchanged and nonspecific but compatible with severe chronic small vessel ischemic disease. Vascular: Calcified atherosclerosis at the skull base. No hyperdense vessel. Skull: No fracture or focal osseous lesion. Sinuses/Orbits: Visualized paranasal sinuses and mastoid air cells are clear. Bilateral cataract extraction is noted. Other: None. ASPECTS [facility] Stroke Program Early CT Score) Not scored with this history. IMPRESSION: 1. No evidence of acute intracranial abnormality. 2. Severe chronic small vessel ischemic disease. These results were called by telephone at the time of interpretation on 10/01/2018 at 8:10 pm to Dr. Roderic Palau, who verbally acknowledged these results. Electronically Signed   By: Logan Bores M.D.   On: 10/01/2018 20:10       Subjective:  Patient seen and examined the bedside this morning.  Comfortable.  Hemodynamically stable.  Speech is fluent.  Stable for discharge.  Discharge  Exam: Vitals:   10/03/18 0850 10/03/18 1234  BP: (!) 155/73 (!) 169/88  Pulse: 62 (!) 51  Resp: 20 20  Temp: 98.8 F (37.1 C) 98.3 F (36.8 C)  SpO2: 98% 100%   Vitals:   10/02/18 2331 10/03/18 0323 10/03/18 0850 10/03/18 1234  BP: (!) 146/79 (!) 142/70 (!) 155/73 (!) 169/88  Pulse: 64 65 62 (!) 51  Resp: 16 16 20 20   Temp: 99 F (37.2 C) 99.1 F (37.3 C) 98.8 F (37.1 C) 98.3 F (36.8 C)  TempSrc: Oral Oral Oral Oral  SpO2: 96% 98% 98% 100%  Weight:      Height:        General: Pt is alert, awake, not in acute distress Cardiovascular: RRR, S1/S2 +, no rubs, no gallops Respiratory: CTA bilaterally, no wheezing, no rhonchi Abdominal: Soft, NT, ND, bowel sounds + Extremities: no edema, no cyanosis    The results of significant diagnostics from this hospitalization (including imaging, microbiology, ancillary and laboratory) are listed below for reference.     Microbiology: Recent Results (from the past 240 hour(s))  SARS Coronavirus 2 [facility] order, Performed in [facility] hospital lab) Nasopharyngeal Nasopharyngeal Swab     Status: None   Collection Time: 10/01/18  7:57 PM   Specimen: Nasopharyngeal Swab  Result Value Ref Range Status   SARS Coronavirus 2 NEGATIVE NEGATIVE Final    Comment: (NOTE) If result is NEGATIVE SARS-CoV-2 target nucleic acids are NOT DETECTED. The SARS-CoV-2 RNA is generally detectable in upper and lower  respiratory specimens during the acute phase of infection. The lowest  concentration of SARS-CoV-2 viral copies this assay can detect is 250  copies / mL. A negative result does not preclude SARS-CoV-2 infection  and should not be used as the sole basis for treatment or other  patient management decisions.  A negative result may occur with  improper specimen collection / handling, submission of specimen other  than nasopharyngeal swab, presence of viral mutation(s) within the  areas targeted by this assay, and inadequate number of  viral copies  (<250 copies / mL). A negative result must be combined with clinical  observations, patient history, and epidemiological information. If result is POSITIVE SARS-CoV-2 target nucleic acids are DETECTED. The SARS-CoV-2 RNA is generally detectable in upper and lower  respiratory specimens dur ing the acute phase of infection.  Positive  results are indicative of active infection with SARS-CoV-2.  Clinical  correlation with patient history and other diagnostic information is  necessary to determine patient infection status.  Positive results do  not rule out bacterial infection or co-infection with other viruses. If result is PRESUMPTIVE POSTIVE SARS-CoV-2 nucleic acids MAY BE PRESENT.   A presumptive positive result was obtained on the submitted specimen  and confirmed on repeat testing.  While 2019 novel coronavirus  (SARS-CoV-2) nucleic acids may be present in the submitted sample  additional confirmatory testing may be necessary for epidemiological  and / or clinical management purposes  to differentiate between  SARS-CoV-2 and other Sarbecovirus currently known to infect humans.  If clinically indicated additional testing with an alternate test  methodology [phone]) is advised. The SARS-CoV-2 RNA is generally  detectable in upper and lower respiratory sp ecimens during the acute  phase of infection. The expected result is Negative. Fact Sheet for Patients:  StrictlyIdeas.no Fact Sheet for Healthcare Providers: BankingDealers.co.za This test is not yet approved or cleared by the [place] FDA and has been  authorized for detection and/or diagnosis of SARS-CoV-2 by FDA under an Emergency Use Authorization (EUA).  This EUA will remain in effect (meaning this test can be used) for the duration of the COVID-19 declaration under Section 564(b)(1) of the Act, 21 U.S.C. section 360bbb-3(b)(1), unless the authorization is  terminated or revoked sooner. Performed at [facility], [address], [place], [place] [address]   Culture, Urine     Status: None   Collection Time: 10/01/18  9:11 PM   Specimen: Urine, Clean Catch  Result Value Ref Range Status   Specimen Description   Final    URINE, CLEAN CATCH Performed at [facility], [address], [place], [place] [address]    Special Requests   Final    NONE Performed at [facility], [address] [place], [place] [address]    Culture   Final    NO GROWTH Performed at [facility], [place] [address] [place],  [address]    Report Status 10/03/2018 FINAL  Final     Labs: BNP (last 3 results) Recent Labs    12/19/17 1318  BNP 38.1   Basic Metabolic Panel: Recent Labs  Lab 10/01/18 1954 10/01/18 2012  NA 140 143  K 4.2 4.1  CL 106 104  CO2 28  --   GLUCOSE 110* 103*  BUN 23 22  CREATININE 1.30* 1.40*  CALCIUM 9.1  --    Liver Function Tests: Recent Labs  Lab 10/01/18 1954  AST 11*  ALT 9  ALKPHOS 72  BILITOT 0.5  PROT 7.5  ALBUMIN 3.9   No results for input(s): LIPASE, AMYLASE in the last 168 hours. No results for input(s): AMMONIA in the last 168 hours. CBC: Recent Labs  Lab 10/01/18 1954 10/01/18 2012  WBC 8.8  --   NEUTROABS 3.0  --   HGB 13.0 13.9  HCT 41.0 41.0  MCV 100.0  --   PLT 200  --    Cardiac Enzymes: No results for input(s): CKTOTAL, CKMB, CKMBINDEX, TROPONINI in the last 168 hours. BNP: Invalid input(s): POCBNP CBG: Recent Labs  Lab 10/01/18 1953  GLUCAP 107*   D-Dimer No results for input(s): DDIMER in the last 72 hours. Hgb A1c Recent Labs    10/02/18 0414  HGBA1C 5.1   Lipid Profile Recent Labs    10/02/18 0414  CHOL 163  HDL 45  LDLCALC 106*  TRIG 59  CHOLHDL 3.6   Thyroid function studies No results for input(s): TSH, T4TOTAL, T3FREE, THYROIDAB in the last 72 hours.  Invalid input(s): FREET3 Anemia work up No results for input(s): VITAMINB12, FOLATE,  FERRITIN, TIBC, IRON, RETICCTPCT in the last 72 hours. Urinalysis    Component Value Date/Time   COLORURINE STRAW (A) 10/01/2018 2111   APPEARANCEUR CLEAR 10/01/2018 2111   LABSPEC 1.019 10/01/2018 2111   PHURINE 7.0 10/01/2018 2111   GLUCOSEU NEGATIVE 10/01/2018 2111   HGBUR NEGATIVE 10/01/2018 2111   BILIRUBINUR NEGATIVE 10/01/2018 2111   KETONESUR NEGATIVE 10/01/2018 2111   PROTEINUR NEGATIVE 10/01/2018 2111   UROBILINOGEN 0.2 12/29/2014 2314   NITRITE NEGATIVE 10/01/2018 2111   LEUKOCYTESUR NEGATIVE 10/01/2018 2111   Sepsis Labs Invalid input(s): PROCALCITONIN,  WBC,  LACTICIDVEN Microbiology Recent Results (from the past 240 hour(s))  SARS Coronavirus 2 [facility] order, Performed in [facility] hospital lab) Nasopharyngeal Nasopharyngeal Swab     Status: None   Collection Time: 10/01/18  7:57 PM   Specimen: Nasopharyngeal Swab  Result Value Ref Range Status  SARS Coronavirus 2 NEGATIVE NEGATIVE Final    Comment: (NOTE) If result is NEGATIVE SARS-CoV-2 target nucleic acids are NOT DETECTED. The SARS-CoV-2 RNA is generally detectable in upper and lower  respiratory specimens during the acute phase of infection. The lowest  concentration of SARS-CoV-2 viral copies this assay can detect is 250  copies / mL. A negative result does not preclude SARS-CoV-2 infection  and should not be used as the sole basis for treatment or other  patient management decisions.  A negative result may occur with  improper specimen collection / handling, submission of specimen other  than nasopharyngeal swab, presence of viral mutation(s) within the  areas targeted by this assay, and inadequate number of viral copies  (<250 copies / mL). A negative result must be combined with clinical  observations, patient history, and epidemiological information. If result is POSITIVE SARS-CoV-2 target nucleic acids are DETECTED. The SARS-CoV-2 RNA is generally detectable in upper and lower  respiratory  specimens dur ing the acute phase of infection.  Positive  results are indicative of active infection with SARS-CoV-2.  Clinical  correlation with patient history and other diagnostic information is  necessary to determine patient infection status.  Positive results do  not rule out bacterial infection or co-infection with other viruses. If result is PRESUMPTIVE POSTIVE SARS-CoV-2 nucleic acids MAY BE PRESENT.   A presumptive positive result was obtained on the submitted specimen  and confirmed on repeat testing.  While 2019 novel coronavirus  (SARS-CoV-2) nucleic acids may be present in the submitted sample  additional confirmatory testing may be necessary for epidemiological  and / or clinical management purposes  to differentiate between  SARS-CoV-2 and other Sarbecovirus currently known to infect humans.  If clinically indicated additional testing with an alternate test  methodology [phone]) is advised. The SARS-CoV-2 RNA is generally  detectable in upper and lower respiratory sp ecimens during the acute  phase of infection. The expected result is Negative. Fact Sheet for Patients:  StrictlyIdeas.no Fact Sheet for Healthcare Providers: BankingDealers.co.za This test is not yet approved or cleared by the [place] FDA and has been authorized for detection and/or diagnosis of SARS-CoV-2 by FDA under an Emergency Use Authorization (EUA).  This EUA will remain in effect (meaning this test can be used) for the duration of the COVID-19 declaration under Section 564(b)(1) of the Act, 21 U.S.C. section 360bbb-3(b)(1), unless the authorization is terminated or revoked sooner. Performed at [facility], [address], [place], [place] [address]   Culture, Urine     Status: None   Collection Time: 10/01/18  9:11 PM   Specimen: Urine, Clean Catch  Result Value Ref Range Status   Specimen Description   Final    URINE, CLEAN  CATCH Performed at [facility], [address] [place], [place] [address]    Special Requests   Final    NONE Performed at [facility], [address] [place], [place] [address]    Culture   Final    NO GROWTH Performed at [facility], [place] [address] [place], [place] [address]    Report Status 10/03/2018 FINAL  Final    Please note: You were cared for by a hospitalist during your hospital stay. Once you are discharged, your primary care physician will handle any further medical issues. Please note that NO REFILLS for any discharge medications will be authorized once you are discharged, as it is imperative that you return to your primary care physician (or establish  a relationship with a primary care physician if you do not have one) for your post hospital discharge needs so that they can reassess your need for medications and monitor your lab values.    Time coordinating discharge: 40 minutes  SIGNED:   Shelly Coss, MD  [facility] 10/03/2018, 12:40 PM Pager [phone]  If 7PM-7AM, please contact night-coverage [email or web address] Password [email or web address]

## 2018-10-03 NOTE — Discharge Instructions (Signed)

## 2018-10-03 NOTE — Progress Notes (Signed)
Pt discharge education and instructions completed with pt at bedside; pt voices undertsanding and denies any questions. Pt IV and telemetry removed; pt discharge home with daughter to transport her home. Pt transported off unit via wheelchair; pt in hospital gown and belongings to the side as pt personal clothing was dirty. Delia Heady RN

## 2018-10-03 NOTE — Progress Notes (Signed)
EEG complete - results pending 

## 2018-10-04 ENCOUNTER — Ambulatory Visit: Payer: Medicare Other | Admitting: Speech Pathology

## 2018-10-04 ENCOUNTER — Ambulatory Visit: Payer: Medicare Other | Admitting: Occupational Therapy

## 2018-10-04 ENCOUNTER — Ambulatory Visit: Payer: Medicare Other | Admitting: Physical Therapy

## 2018-10-06 ENCOUNTER — Ambulatory Visit: Payer: Medicare Other | Admitting: Speech Pathology

## 2018-10-06 ENCOUNTER — Ambulatory Visit: Payer: Medicare Other | Admitting: Occupational Therapy

## 2018-10-06 ENCOUNTER — Ambulatory Visit: Payer: Medicare Other | Admitting: Physical Therapy

## 2018-10-11 ENCOUNTER — Encounter: Payer: Medicare Other | Admitting: Speech Pathology

## 2018-10-11 ENCOUNTER — Ambulatory Visit: Payer: Medicare Other | Admitting: Physical Therapy

## 2018-10-11 ENCOUNTER — Ambulatory Visit: Payer: Medicare Other | Admitting: Occupational Therapy

## 2018-10-13 ENCOUNTER — Other Ambulatory Visit: Payer: Self-pay

## 2018-10-13 ENCOUNTER — Ambulatory Visit: Payer: Medicare Other | Admitting: Occupational Therapy

## 2018-10-13 ENCOUNTER — Ambulatory Visit: Payer: Medicare Other | Admitting: Physical Therapy

## 2018-10-13 ENCOUNTER — Encounter: Payer: Medicare Other | Admitting: Speech Pathology

## 2018-10-13 ENCOUNTER — Encounter: Payer: Self-pay | Admitting: Physical Therapy

## 2018-10-13 VITALS — BP 140/88

## 2018-10-13 DIAGNOSIS — R262 Difficulty in walking, not elsewhere classified: Secondary | ICD-10-CM

## 2018-10-13 DIAGNOSIS — R2689 Other abnormalities of gait and mobility: Secondary | ICD-10-CM | POA: Diagnosis present

## 2018-10-13 DIAGNOSIS — I69351 Hemiplegia and hemiparesis following cerebral infarction affecting right dominant side: Secondary | ICD-10-CM | POA: Diagnosis not present

## 2018-10-13 DIAGNOSIS — R29818 Other symptoms and signs involving the nervous system: Secondary | ICD-10-CM

## 2018-10-13 DIAGNOSIS — M25612 Stiffness of left shoulder, not elsewhere classified: Secondary | ICD-10-CM | POA: Diagnosis present

## 2018-10-13 DIAGNOSIS — M6281 Muscle weakness (generalized): Secondary | ICD-10-CM | POA: Diagnosis present

## 2018-10-13 DIAGNOSIS — R471 Dysarthria and anarthria: Secondary | ICD-10-CM | POA: Diagnosis present

## 2018-10-13 DIAGNOSIS — R293 Abnormal posture: Secondary | ICD-10-CM | POA: Diagnosis present

## 2018-10-13 NOTE — Therapy (Signed)
[facility] [address] [place], [place], [address] Phone: [phone]   Fax:  [phone]  Physical Therapy Treatment  Patient Details  Name: Holly Flores MRN: [id number] Date of Birth: [birth year] Referring Provider (PT): Octavio Graves, DO   Encounter Date: 10/13/2018  PT End of Session - 10/13/18 7544    Visit Number  19    Number of Visits  26    Date for PT Re-Evaluation  10/18/18    Authorization Type  Progress note every 10th visit; KX modifier at 15th visit.    PT Start Time  1230    PT Stop Time  1315    PT Time Calculation (min)  45 min    Activity Tolerance  Patient tolerated treatment well   elevated BP   Behavior During Therapy  WFL for tasks assessed/performed       Past Medical History:  Diagnosis Date  . Anxiety   . Arthritis    "all over" (12/20/2017)  . Central retinal vein occlusion of left eye 01/22/2015  . CHF (congestive heart failure) ([place])   . Chronic bronchitis ([place])    "get it most years" (12/20/2017)  . Chronic lower back pain   . CKD (chronic kidney disease), stage II   . COPD (chronic obstructive pulmonary disease) ([place])   . CVA (cerebral vascular accident) ([place]) 12/30/2014   OCULAR  LEFT  EYE   . Depression   . Fibromyalgia   . GERD (gastroesophageal reflux disease)   . Goiter   . History of colonic polyps 05/14/2014  . Hyperlipidemia   . Hypertension   . Hypothyroidism   . Kidney cysts    left side  . 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 Migraine    "couple times/wk; been having them for a long time" (12/20/2017)  . Myocardial infarction [facility])    "one dr said I did; one said I didn't;  don't remember when it was" (12/20/2017)  . 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 On home oxygen therapy    "3L; sleep w/it q night" (12/20/2017)  . Pneumonia    "several times" (12/20/2017)  . Shortness of breath   . Sleep apnea    "stopped  mask when I got on the oxygen" (12/20/2017)  . Stroke [facility])     Past Surgical History:  Procedure Laterality Date  . ANTERIOR CERVICAL DECOMP/DISCECTOMY FUSION    . BACK SURGERY    . BREAST BIOPSY     left-non cancerous  . CARDIAC CATHETERIZATION    . CARPAL TUNNEL RELEASE Left   . CATARACT EXTRACTION W/PHACO  09/21/2011   Procedure: CATARACT EXTRACTION PHACO AND INTRAOCULAR LENS PLACEMENT (IOC);  Surgeon: Williams Che, MD;  Location: [facility];  Service: Ophthalmology;  Laterality: Left;  CDE:9.78  . CATARACT EXTRACTION W/PHACO Right 10/15/2014   Procedure: CATARACT EXTRACTION PHACO AND INTRAOCULAR LENS PLACEMENT ([place]);  Surgeon: Williams Che, MD;  Location: [facility];  Service: Ophthalmology;  Laterality: Right;  CDE:4.19  . [place]; 1972  . CHOLECYSTECTOMY OPEN    . COLONOSCOPY  2006   RMR: 1. Internal hemorroids, otherwise normal rectum. 2. Pedunculated polyp at 35 cm. reomved with snare. The remainder of teh colonic mucosa appeared normal.   . COLONOSCOPY  2011   Dr. Gala Romney: multiple ascending colon polyps and rectal polyp, adenomatous  . COLONOSCOPY N/A 05/31/2014   Procedure: COLONOSCOPY;  Surgeon: Daneil Dolin, MD;  Location: [facility];  Service: Endoscopy;  Laterality: N/A;  130  . COLONOSCOPY WITH PROPOFOL N/A 06/24/2017   Procedure: COLONOSCOPY WITH PROPOFOL;  Surgeon: Daneil Dolin, MD;  Location[facility];  Service: Endoscopy;  Laterality: N/A;  2:15pm  . ESOPHAGOGASTRODUODENOSCOPY  2006   Dr. Gala Romney: Subtle Schatzkis ring, otherwise normal upper GI tract, aside from a small pyloric channell erosion, status post dilation as described above.   Marland Kitchen FLEXIBLE SIGMOIDOSCOPY N/A 11/11/2017   Procedure: FLEXIBLE SIGMOIDOSCOPY;  Surgeon: Otis Brace, MD;  Location: [place];  Service: Gastroenterology;  Laterality: N/A;  . JOINT REPLACEMENT    . LUMBAR DISC SURGERY  X 2  . POLYPECTOMY  06/24/2017   Procedure: POLYPECTOMY;  Surgeon: Daneil Dolin, MD;   Location: [facility];  Service: Endoscopy;;  ascending  . POSTERIOR LUMBAR FUSION  ? date 1st fusion ; 07/2006   previous L4-5 Ray threaded fusion cage; Exploration of L4-5 fusion & PLIF 07/22/2006/notes 07/22/2006  . TONSILLECTOMY    . TOTAL KNEE ARTHROPLASTY Right 05/24/2013   Procedure: TOTAL KNEE ARTHROPLASTY;  Surgeon: [name], MD;  Location: [facility];  Service: Orthopedics;  Laterality: Right;  . TOTAL KNEE ARTHROPLASTY Left   . VAGINAL HYSTERECTOMY      Vitals:   10/13/18 1242  BP: 140/88    Subjective Assessment - 10/13/18 1242    Subjective  Was in the hosptial for suspected CVA but they ruled out a CVA.  No changes since she has been home.  Is now kicking her RLE out straight!  Had to reschedule appointment with [employer] to be fit with her new AFO.    Pertinent History  left sided CVA 10/29/17, Chronic CHF, AFib, COPD, Chronic Kidney Disease stage III, hx of bil TKA, Lumbar surgery    Patient Stated Goals  move around better in the home and improve function    Currently in Pain?  No/denies    Pain Onset  More than a month ago                       [facility] Adult PT Treatment/Exercise - 10/13/18 1325      Transfers   Transfers  Sit to Stand;Stand to Sit    Sit to Stand  4: Min assist    Sit to Stand Details (indicate cue type and reason)  From w/c, from [place] and then from [employer] seat, using LUE to assist    Stand to Sit  4: Min guard    Stand to Sit Details  cues to shift pelvis to R    Transfer via Programmer, applications Standing Balance   Dynamic Standing - Balance Support  No upper extremity supported;During functional activity    Dynamic Standing - Level of Assistance  3: Mod assist    Dynamic Standing - Balance Activities  Forward lean/weight shifting;Lateral lean/weight shifting;Reaching for objects;Reaching across midline    Reaching for objects comments:  From tall seated position on Stedy pt performed multiple sit <> stands without use  of UE reaching with LUE forwards and slightly to the L for activation of RLE extensors and for postural control training.  Reaching to place cones on tall table in front and then to stack cones, 8 cones.  Intermittent facilitation from PT to bring head to midline and to fully extend R hip and knee in standing.    Reaching across midline comments:  One rep  of holding cone in RUE and reaching across midline to retrieve from [place]    Therapeutic Activities  Other Therapeutic Activities    Other Therapeutic Activities  discussed Stedy lift and that therapist had submitted a LMN but awaiting insurance approval for standing aide for home.  Son reports they have rescheduled her fitting for her custom AFO.  Pt reporting she priced having a personal aide at home and it was $21/hour.  Discussed other options for in home aide.      Knee/Hip Exercises: Aerobic   Nustep  with LE only for extensor training at level 4 resistance x 5 minutes with therapist assisting with maintaining neutral R hip rotation and heel placement on pedal             PT Education - 10/13/18 1337    Education Details  see TA    Person(s) Educated  Patient;Child(ren)    Methods  Explanation    Comprehension  Verbalized understanding       PT Short Term Goals - 10/13/18 1338      PT SHORT TERM GOAL #1   Title  Pt and family will be independent with supine, seated and standing HEP    Time  4    Status  Unable to assess    Target Date  09/18/18      PT SHORT TERM GOAL #2   Title  Pt will perform slideboard transfers to left and right on level surfaces with min A of family    Baseline  Mod to R, Min to L    Time  4    Period  Weeks    Status  Unable to assess    Target Date  09/18/18      PT SHORT TERM GOAL #3   Title  Pt will perform bed mobility on flat mat with mod A (supine <> sit and rolling)    Baseline  MET 09/27/2018    Time  4    Period  Weeks    Status  Unable to assess    Target  Date  09/18/18      PT SHORT TERM GOAL #4   Title  Pt will perform sit > stand pushing from wheelchair with mod A on R side    Time  4    Period  Weeks    Status  Unable to assess    Target Date  09/18/18      PT SHORT TERM GOAL #5   Title  Pt will ambulate x 25' with RW with hand orthosis, trial AFO and mod-max A of one therapist    Time  4    Period  Weeks    Status  Unable to assess    Target Date  09/18/18        PT Long Term Goals - 08/19/18 0916      PT LONG TERM GOAL #1   Title  Patient and family will be independent with final HEP recommendations    Time  8    Period  Weeks    Status  Revised    Target Date  10/18/18      PT LONG TERM GOAL #2   Title  Patient will perform sit <> stand from wheelchair with min A and transfer stand pivot with RW and AFO with Mod A of one person    Time  8    Period  Weeks  Status  Revised    Target Date  10/18/18      PT LONG TERM GOAL #3   Title  Pt will perform bed mobility on flat mat with min A    Time  8    Period  Weeks    Status  Revised    Target Date  10/18/18      PT LONG TERM GOAL #4   Title  Family will demonstrate independent ability to don appropriate AFO with R shoe    Time  8    Period  Weeks    Status  New    Target Date  10/18/18      PT LONG TERM GOAL #5   Title  Patient will ambulate 40 feet or greater with min A, AFO, and LRAD to safely navigate around home.    Time  8    Period  Weeks    Status  Revised    Target Date  10/18/18            Plan - 10/13/18 1338    Clinical Impression Statement  Pt cleared to return to therapy after being in hospital for CVA like symptoms; no CVA found.  No significant changes in patient function since hospitalization; pt is actually demonstrating increased active knee extension on RLE.  Treatment session focused on continued NMR for R sided attention and activation with reciprocal activation on Nustep against resistance and sit > stand with functional  reaching.  Pt tolerated well with BP WFL today.  Did not check STG today as LTG will be due at next session.  Will continue to address and progress towards LTG.    Personal Factors and Comorbidities  Age;Comorbidity 3+;Time since onset of injury/illness/exacerbation    Examination-Activity Limitations  Lift;Dressing;Self Feeding;Transfers;Toileting;Stand;Locomotion Level;Reach Overhead;Bed Mobility    Rehab Potential  Fair    PT Frequency  2x / week    PT Duration  8 weeks    PT Treatment/Interventions  ADLs/Self Care Home Management;Electrical Stimulation;Gait training;Stair training;Functional mobility training;Balance training;Therapeutic exercise;Therapeutic activities;Neuromuscular re-education;Wheelchair mobility training;Manual techniques;Passive range of motion;Patient/family education;DME Instruction;Energy conservation;Orthotic Fit/Training    PT Next Visit Plan  MONITOR BP EVERY SESSION WITH MANUAL CUFF ON LUE; Check LTG and recertify for more visits; Nustep with R hand orthosis.  Seated mirror therapy for LE.  Stedy transfers and sit <> stand on Stedy without UE support.  Seated on large rockerboard or inverted BOSU performing lateral tilts or anterior/posterior for sit <> stand.  When she receives her AFO begin using for transfers, standing and gait.  Work on decreasing assistance needed to perform level slideboard transfers to L and R.  Work on bed mobility on flat bed.  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 Acute CVA (  cerebrovascular accident) ([place]) 10/01/2018  . Physical deconditioning 12/19/2017  . Chronic kidney disease (CKD), stage III (moderate) (HCC)   . Hypoalbuminemia due to protein-calorie malnutrition ([place])   . Intractable migraine without status migrainosus   . Reactive depression   . Hemiparesis affecting right side as late effect of stroke ([place])   . Slow transit constipation   . PAF (paroxysmal atrial fibrillation) ([place])   . Dysphasia, post-stroke   . COPD (chronic obstructive pulmonary disease) ([place]) 10/29/2017  . Chronic diastolic CHF (congestive heart failure) ([place]) 10/29/2017  . Hypothyroidism 10/29/2017  . Essential hypertension 10/29/2017  . GERD (gastroesophageal reflux disease) 10/29/2017  . Anxiety 10/29/2017  . 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 Hyperlipidemia 04/20/2013   Rico Junker, PT, DPT 10/13/18    1:42 PM    [facility] [address] [place], [place], [address] Phone: [phone]   Fax:  [phone]  Name: TALINE NASS MRN: [id number] Date of Birth: 1945/12/08

## 2018-10-17 ENCOUNTER — Other Ambulatory Visit: Payer: Self-pay

## 2018-10-17 ENCOUNTER — Encounter: Payer: Self-pay | Admitting: Physical Therapy

## 2018-10-17 ENCOUNTER — Ambulatory Visit: Payer: Medicare Other | Admitting: Physical Therapy

## 2018-10-17 ENCOUNTER — Ambulatory Visit: Payer: Medicare Other | Admitting: Occupational Therapy

## 2018-10-17 DIAGNOSIS — M6281 Muscle weakness (generalized): Secondary | ICD-10-CM

## 2018-10-17 DIAGNOSIS — R293 Abnormal posture: Secondary | ICD-10-CM

## 2018-10-17 DIAGNOSIS — R262 Difficulty in walking, not elsewhere classified: Secondary | ICD-10-CM

## 2018-10-17 DIAGNOSIS — I69351 Hemiplegia and hemiparesis following cerebral infarction affecting right dominant side: Secondary | ICD-10-CM | POA: Diagnosis not present

## 2018-10-17 DIAGNOSIS — R29818 Other symptoms and signs involving the nervous system: Secondary | ICD-10-CM

## 2018-10-17 NOTE — Therapy (Signed)
[facility] [address] [place], [place], [address] Phone: [phone]   Fax:  [phone]  Physical Therapy Treatment and 10th visit Progress Note  Patient Details  Name: Holly Flores MRN: [id number] Date of Birth: Sep 29, 1945 Referring Provider (PT): Octavio Graves, DO   Encounter Date: 10/17/2018   Progress Note Reporting Period 08/19/2018 to 10/17/2018  See note below for Objective Data and Assessment of Progress/Goals.    PT End of Session - 10/17/18 2228    Visit Number  20    Number of Visits  26    Date for PT Re-Evaluation  10/18/18    Authorization Type  Progress note every 10th visit; KX modifier at 15th visit.    PT Start Time  1231    PT Stop Time  1320    PT Time Calculation (min)  49 min    Activity Tolerance  Patient tolerated treatment well   elevated BP   Behavior During Therapy  WFL for tasks assessed/performed       Past Medical History:  Diagnosis Date  . Anxiety   . Arthritis    "all over" (12/20/2017)  . Central retinal vein occlusion of left eye 01/22/2015  . CHF (congestive heart failure) ([place])   . Chronic bronchitis ([place])    "get it most years" (12/20/2017)  . Chronic lower back pain   . CKD (chronic kidney disease), stage II   . COPD (chronic obstructive pulmonary disease) ([place])   . CVA (cerebral vascular accident) ([place]) 12/30/2014   OCULAR  LEFT  EYE   . Depression   . Fibromyalgia   . GERD (gastroesophageal reflux disease)   . Goiter   . History of colonic polyps 05/14/2014  . Hyperlipidemia   . Hypertension   . Hypothyroidism   . Kidney cysts    left side  . 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 Migraine    "couple times/wk; been having them for a long time" (12/20/2017)  . Myocardial infarction [facility])    "one dr said I did; one said I didn't;  don't remember when it was" (12/20/2017)  . 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 On  home oxygen therapy    "3L; sleep w/it q night" (12/20/2017)  . Pneumonia    "several times" (12/20/2017)  . Shortness of breath   . Sleep apnea    "stopped mask when I got on the oxygen" (12/20/2017)  . Stroke [facility])     Past Surgical History:  Procedure Laterality Date  . ANTERIOR CERVICAL DECOMP/DISCECTOMY FUSION    . BACK SURGERY    . BREAST BIOPSY     left-non cancerous  . CARDIAC CATHETERIZATION    . CARPAL TUNNEL RELEASE Left   . CATARACT EXTRACTION W/PHACO  09/21/2011   Procedure: CATARACT EXTRACTION PHACO AND INTRAOCULAR LENS PLACEMENT (IOC);  Surgeon: Williams Che, MD;  Location: [facility];  Service: Ophthalmology;  Laterality: Left;  CDE:9.78  . CATARACT EXTRACTION W/PHACO Right 10/15/2014   Procedure: CATARACT EXTRACTION PHACO AND INTRAOCULAR LENS PLACEMENT ([place]);  Surgeon: Williams Che, MD;  Location: [facility];  Service: Ophthalmology;  Laterality: Right;  CDE:4.19  . [place]; 1972  . CHOLECYSTECTOMY OPEN    . COLONOSCOPY  2006   RMR: 1. Internal hemorroids, otherwise normal rectum. 2. Pedunculated polyp at 35 cm. reomved with snare. The remainder of teh colonic mucosa appeared normal.   . COLONOSCOPY  2011  Dr. Gala Romney: multiple ascending colon polyps and rectal polyp, adenomatous  . COLONOSCOPY N/A 05/31/2014   Procedure: COLONOSCOPY;  Surgeon: Daneil Dolin, MD;  Location: [facility];  Service: Endoscopy;  Laterality: N/A;  130  . COLONOSCOPY WITH PROPOFOL N/A 06/24/2017   Procedure: COLONOSCOPY WITH PROPOFOL;  Surgeon: Daneil Dolin, MD;  [facility]: [facility];  Service: Endoscopy;  Laterality: N/A;  2:15pm  . ESOPHAGOGASTRODUODENOSCOPY  2006   Dr. Gala Romney: Subtle Schatzkis ring, otherwise normal upper GI tract, aside from a small pyloric channell erosion, status post dilation as described above.   Marland Kitchen FLEXIBLE SIGMOIDOSCOPY N/A 11/11/2017   Procedure: FLEXIBLE SIGMOIDOSCOPY;  Surgeon: Otis Brace, MD;  Location: [facility];  Service:  Gastroenterology;  Laterality: N/A;  . JOINT REPLACEMENT    . LUMBAR DISC SURGERY  X 2  . POLYPECTOMY  06/24/2017   Procedure: POLYPECTOMY;  Surgeon: Daneil Dolin, MD;  [facility]: [facility];  Service: Endoscopy;;  ascending  . POSTERIOR LUMBAR FUSION  ? date 1st fusion ; 07/2006   previous L4-5 Ray threaded fusion cage; Exploration of L4-5 fusion & PLIF 07/22/2006/notes 07/22/2006  . TONSILLECTOMY    . TOTAL KNEE ARTHROPLASTY Right 05/24/2013   Procedure: TOTAL KNEE ARTHROPLASTY;  Surgeon: [name], MD;  Location: [facility];  Service: Orthopedics;  Laterality: Right;  . TOTAL KNEE ARTHROPLASTY Left   . VAGINAL HYSTERECTOMY      There were no vitals filed for this visit.  Subjective Assessment - 10/17/18 1238    Subjective  Was worn out after last session but doing well today.  Asking what time her brace appointment is on Wednesday.  Has cardiology appointment on Thursday before PT/OT.    Pertinent History  left sided CVA 10/29/17, Chronic CHF, AFib, COPD, Chronic Kidney Disease stage III, hx of bil TKA, Lumbar surgery    Patient Stated Goals  move around better in the home and improve function    Currently in Pain?  No/denies    Pain Onset  More than a month ago                       [facility] Adult PT Treatment/Exercise - 10/17/18 2158      Transfers   Transfers  Sit to Stand;Stand to Sit    Sit to Stand  4: Min assist    Sit to Stand Details  Manual facilitation for weight shifting;Manual facilitation for placement;Manual facilitation for weight bearing    Sit to Stand Details (indicate cue type and reason)  to maintain head in midline and to support RUE    Stand to Sit  4: Min assist    Stand to Sit Details  cues to shift pelvis to R    Transfer via Multimedia programmer      Ambulation/Gait   Ambulation/Gait  Yes    Ambulation/Gait Assistance  2: Max assist    Ambulation/Gait Assistance Details  LUE supported on // bars performed two sets of ambulating down bars  with therapist on R side assisting with stabilization of RLE in stance, forward and full lateral weight shift to RLE to allow for full step length with LLE and for ABD/placement of RLE during swing but pt advancing RLE without therapist assistance    Ambulation Distance (Feet)  10 Feet    Assistive device  Parallel bars    Gait Pattern  Step-through pattern;Decreased step length - left;Decreased stance time - right;Decreased hip/knee flexion - right;Decreased  dorsiflexion - right;Decreased weight shift to right;Right flexed knee in stance;Poor foot clearance - right;Scissoring    Ambulation Surface  Level;Indoor      Neuro Re-ed    Neuro Re-ed Details   Seated on mat with R hip wedged for trunk shortening and weight shift to L placed mirror to face LLE so that pt could see "right leg" in the mirror.  Performed mirror therapy with 10-20 reps of each movement with pt focusing on visual of "right leg" performing these movements: heel lift, toe lifts, LAQ, hamstring curls, hip ABD<>ADD, hip flexion.  Also performed sensory discrimination with RLE still blocked by mirror but therapist touching various sections of patient's RLE and then at the same time simulating that on LLE so that pt could match what she saw in mirror to what she felt on RLE.             PT Education - 10/17/18 2227    Education Details  Will find out if pt will need to go to [facility] to be fit for AFO or if orthotist will bring to PT appointment; rescheduled Thursday's PT appointment due to cardiology appointment    Person(s) Educated  Patient;Child(ren)    Methods  Explanation    Comprehension  Verbalized understanding       PT Short Term Goals - 10/13/18 1338      PT SHORT TERM GOAL #1   Title  Pt and family will be independent with supine, seated and standing HEP    Time  4    Status  Unable to assess    Target Date  09/18/18      PT SHORT TERM GOAL #2   Title  Pt will perform slideboard transfers to left and right  on level surfaces with min A of family    Baseline  Mod to R, Min to L    Time  4    Period  Weeks    Status  Unable to assess    Target Date  09/18/18      PT SHORT TERM GOAL #3   Title  Pt will perform bed mobility on flat mat with mod A (supine <> sit and rolling)    Baseline  MET 09/27/2018    Time  4    Period  Weeks    Status  Unable to assess    Target Date  09/18/18      PT SHORT TERM GOAL #4   Title  Pt will perform sit > stand pushing from wheelchair with mod A on R side    Time  4    Period  Weeks    Status  Unable to assess    Target Date  09/18/18      PT SHORT TERM GOAL #5   Title  Pt will ambulate x 25' with RW with hand orthosis, trial AFO and mod-max A of one therapist    Time  4    Period  Weeks    Status  Unable to assess    Target Date  09/18/18        PT Long Term Goals - 08/19/18 0916      PT LONG TERM GOAL #1   Title  Patient and family will be independent with final HEP recommendations    Time  8    Period  Weeks    Status  Revised    Target Date  10/18/18      PT LONG TERM GOAL #  2   Title  Patient will perform sit <> stand from wheelchair with min A and transfer stand pivot with RW and AFO with Mod A of one person    Time  8    Period  Weeks    Status  Revised    Target Date  10/18/18      PT LONG TERM GOAL #3   Title  Pt will perform bed mobility on flat mat with min A    Time  8    Period  Weeks    Status  Revised    Target Date  10/18/18      PT LONG TERM GOAL #4   Title  Family will demonstrate independent ability to don appropriate AFO with R shoe    Time  8    Period  Weeks    Status  New    Target Date  10/18/18      PT LONG TERM GOAL #5   Title  Patient will ambulate 40 feet or greater with min A, AFO, and LRAD to safely navigate around home.    Time  8    Period  Weeks    Status  Revised    Target Date  10/18/18            Plan - 10/17/18 2229    Clinical Impression Statement  Continued to focus on safe and  more independent sit <> stand transfers with [facility] with therapist assisting only by maintaining head in midline and supporting RUE.  Continued mirror therapy for motor imagery and sensory discrimination training.  Returned to gait training at // bars with pt continuing to demonstrate motor return in R hip flexor and quad muscles allowing pt to advance her RLE during swing phase.  Still requires significant upper trunk support and knee stabilizationin stance.  Pt to receive AFO this Thursday and will assess goals and recertify for more visits to continue to address impairments and to continue to progress towards LTG.    Personal Factors and Comorbidities  Age;Comorbidity 3+;Time since onset of injury/illness/exacerbation    Examination-Activity Limitations  Lift;Dressing;Self Feeding;Transfers;Toileting;Stand;Locomotion Level;Reach Overhead;Bed Mobility    Rehab Potential  Fair    PT Frequency  2x / week    PT Duration  8 weeks    PT Treatment/Interventions  ADLs/Self Care Home Management;Electrical Stimulation;Gait training;Stair training;Functional mobility training;Balance training;Therapeutic exercise;Therapeutic activities;Neuromuscular re-education;Wheelchair mobility training;Manual techniques;Passive range of motion;Patient/family education;DME Instruction;Energy conservation;Orthotic Fit/Training    PT Next Visit Plan  MONITOR BP EVERY SESSION WITH MANUAL CUFF ON LUE; Check LTG and recertify for more visits; Training with new AFO.  Nustep with R hand orthosis.  Seated mirror therapy for LE.  Stedy transfers and sit <> stand on Stedy without UE support.  Seated on large rockerboard or inverted BOSU performing lateral tilts or anterior/posterior for sit <> stand.  When she receives her AFO begin using for transfers, standing and gait.  Work on decreasing assistance needed to perform level slideboard transfers to L and R.  Work on bed mobility on flat bed.  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 Acute CVA (cerebrovascular accident) ([place]) 10/01/2018  . Physical deconditioning 12/19/2017  . Chronic kidney disease (CKD), stage III (moderate) ([facility])   . Hypoalbuminemia due to protein-calorie malnutrition ([place])   . Intractable migraine without status migrainosus   . Reactive depression   . Hemiparesis affecting right side as late effect of stroke ([place])   . Slow transit constipation   . PAF (paroxysmal atrial fibrillation) ([place])   . Dysphasia, post-stroke   . COPD (chronic obstructive pulmonary disease) ([place]) 10/29/2017  . Chronic diastolic CHF (congestive heart failure) ([place]) 10/29/2017  . Hypothyroidism 10/29/2017  . Essential hypertension 10/29/2017  . GERD (gastroesophageal reflux disease) 10/29/2017  . Anxiety 10/29/2017  . 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 Hyperlipidemia 04/20/2013   Rico Junker, PT, DPT 10/17/18    10:36 PM    [facility] [address] [place], [place], [address] Phone: [phone]   Fax:  [phone]  Name: Holly Flores MRN: [id number] Date of Birth:  27-Mar-1945

## 2018-10-20 ENCOUNTER — Ambulatory Visit: Payer: Medicare Other | Admitting: Occupational Therapy

## 2018-10-20 ENCOUNTER — Ambulatory Visit: Payer: Medicare Other | Attending: *Deleted | Admitting: Physical Therapy

## 2018-10-20 ENCOUNTER — Other Ambulatory Visit: Payer: Self-pay

## 2018-10-20 ENCOUNTER — Ambulatory Visit (INDEPENDENT_AMBULATORY_CARE_PROVIDER_SITE_OTHER): Payer: Medicare Other | Admitting: Cardiology

## 2018-10-20 ENCOUNTER — Encounter: Payer: Self-pay | Admitting: Physical Therapy

## 2018-10-20 ENCOUNTER — Encounter: Payer: Self-pay | Admitting: Cardiology

## 2018-10-20 VITALS — BP 155/91 | HR 52 | Ht 61.0 in | Wt 185.0 lb

## 2018-10-20 DIAGNOSIS — I639 Cerebral infarction, unspecified: Secondary | ICD-10-CM

## 2018-10-20 DIAGNOSIS — R293 Abnormal posture: Secondary | ICD-10-CM | POA: Diagnosis present

## 2018-10-20 DIAGNOSIS — M25612 Stiffness of left shoulder, not elsewhere classified: Secondary | ICD-10-CM | POA: Diagnosis present

## 2018-10-20 DIAGNOSIS — I10 Essential (primary) hypertension: Secondary | ICD-10-CM

## 2018-10-20 DIAGNOSIS — R079 Chest pain, unspecified: Secondary | ICD-10-CM

## 2018-10-20 DIAGNOSIS — M21371 Foot drop, right foot: Secondary | ICD-10-CM

## 2018-10-20 DIAGNOSIS — R2689 Other abnormalities of gait and mobility: Secondary | ICD-10-CM | POA: Insufficient documentation

## 2018-10-20 DIAGNOSIS — Z8673 Personal history of transient ischemic attack (TIA), and cerebral infarction without residual deficits: Secondary | ICD-10-CM

## 2018-10-20 DIAGNOSIS — R262 Difficulty in walking, not elsewhere classified: Secondary | ICD-10-CM

## 2018-10-20 DIAGNOSIS — R55 Syncope and collapse: Secondary | ICD-10-CM | POA: Diagnosis not present

## 2018-10-20 DIAGNOSIS — I69351 Hemiplegia and hemiparesis following cerebral infarction affecting right dominant side: Secondary | ICD-10-CM | POA: Diagnosis not present

## 2018-10-20 DIAGNOSIS — R29818 Other symptoms and signs involving the nervous system: Secondary | ICD-10-CM | POA: Insufficient documentation

## 2018-10-20 DIAGNOSIS — M6281 Muscle weakness (generalized): Secondary | ICD-10-CM

## 2018-10-20 DIAGNOSIS — I4892 Unspecified atrial flutter: Secondary | ICD-10-CM

## 2018-10-20 MED ORDER — ATORVASTATIN CALCIUM 40 MG PO TABS: 40.0000 mg | ORAL_TABLET | Freq: Every day | ORAL | 1 refills | Status: DC

## 2018-10-20 MED ORDER — METOPROLOL SUCCINATE ER 25 MG PO TB24: 25.0000 mg | ORAL_TABLET | Freq: Every day | ORAL | 1 refills | Status: DC

## 2018-10-20 NOTE — Therapy (Signed)
[facility] [address] [place], [place], [address] Phone: [phone]   Fax:  [phone]  Physical Therapy Treatment  Patient Details  Name: Holly Flores MRN: [id number] Date of Birth: 15-Mar-1945 Referring Provider (PT): Octavio Graves, DO   Encounter Date: 10/20/2018  PT End of Session - 10/20/18 1723    Visit Number  21    Number of Visits  37    Date for PT Re-Evaluation  12/19/18    Authorization Type  Progress note every 10th visit; KX modifier at 15th visit.    PT Start Time  1324    PT Stop Time  1408    PT Time Calculation (min)  44 min    Equipment Utilized During Treatment  Other (comment)   afo   Activity Tolerance  Patient tolerated treatment well   elevated BP   Behavior During Therapy  WFL for tasks assessed/performed       Past Medical History:  Diagnosis Date  . Anxiety   . Arthritis    "all over" (12/20/2017)  . Central retinal vein occlusion of left eye 01/22/2015  . CHF (congestive heart failure) ([place])   . Chronic bronchitis ([place])    "get it most years" (12/20/2017)  . Chronic lower back pain   . CKD (chronic kidney disease), stage II   . COPD (chronic obstructive pulmonary disease) ([place])   . CVA (cerebral vascular accident) ([place]) 12/30/2014   OCULAR  LEFT  EYE   . Depression   . Fibromyalgia   . GERD (gastroesophageal reflux disease)   . Goiter   . History of colonic polyps 05/14/2014  . Hyperlipidemia   . Hypertension   . Hypothyroidism   . Kidney cysts    left side  . 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 Migraine    "couple times/wk; been having them for a long time" (12/20/2017)  . Myocardial infarction [facility])    "one dr said I did; one said I didn't;  don't remember when it was" (12/20/2017)  . 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 On home oxygen therapy    "3L; sleep w/it q night" (12/20/2017)  . Pneumonia    "several times"  (12/20/2017)  . Shortness of breath   . Sleep apnea    "stopped mask when I got on the oxygen" (12/20/2017)  . Stroke [facility])     Past Surgical History:  Procedure Laterality Date  . ANTERIOR CERVICAL DECOMP/DISCECTOMY FUSION    . BACK SURGERY    . BREAST BIOPSY     left-non cancerous  . CARDIAC CATHETERIZATION    . CARPAL TUNNEL RELEASE Left   . CATARACT EXTRACTION W/PHACO  09/21/2011   Procedure: CATARACT EXTRACTION PHACO AND INTRAOCULAR LENS PLACEMENT (IOC);  Surgeon: Williams Che, MD;  Location: [facility];  Service: Ophthalmology;  Laterality: Left;  CDE:9.78  . CATARACT EXTRACTION W/PHACO Right 10/15/2014   Procedure: CATARACT EXTRACTION PHACO AND INTRAOCULAR LENS PLACEMENT ([place]);  Surgeon: Williams Che, MD;  Location: [facility];  Service: Ophthalmology;  Laterality: Right;  CDE:4.19  . [place]; 1972  . CHOLECYSTECTOMY OPEN    . COLONOSCOPY  2006   RMR: 1. Internal hemorroids, otherwise normal rectum. 2. Pedunculated polyp at 35 cm. reomved with snare. The remainder of teh colonic mucosa appeared normal.   . COLONOSCOPY  2011   Dr. Gala Romney: multiple ascending colon polyps and rectal polyp, adenomatous  . COLONOSCOPY  N/A 05/31/2014   Procedure: COLONOSCOPY;  Surgeon: Daneil Dolin, MD;  Location: [facility];  Service: Endoscopy;  Laterality: N/A;  130  . COLONOSCOPY WITH PROPOFOL N/A 06/24/2017   Procedure: COLONOSCOPY WITH PROPOFOL;  Surgeon: Daneil Dolin, MD;  [facility];  Service: Endoscopy;  Laterality: N/A;  2:15pm  . ESOPHAGOGASTRODUODENOSCOPY  2006   Dr. Gala Romney: Subtle Schatzkis ring, otherwise normal upper GI tract, aside from a small pyloric channell erosion, status post dilation as described above.   Marland Kitchen FLEXIBLE SIGMOIDOSCOPY N/A 11/11/2017   Procedure: FLEXIBLE SIGMOIDOSCOPY;  Surgeon: Otis Brace, MD;  Location: [facility];  Service: Gastroenterology;  Laterality: N/A;  . JOINT REPLACEMENT    . LUMBAR DISC SURGERY  X 2  . POLYPECTOMY   06/24/2017   Procedure: POLYPECTOMY;  Surgeon: Daneil Dolin, MD;  Location: [facility];  Service: Endoscopy;;  ascending  . POSTERIOR LUMBAR FUSION  ? date 1st fusion ; 07/2006   previous L4-5 Ray threaded fusion cage; Exploration of L4-5 fusion & PLIF 07/22/2006/notes 07/22/2006  . TONSILLECTOMY    . TOTAL KNEE ARTHROPLASTY Right 05/24/2013   Procedure: TOTAL KNEE ARTHROPLASTY;  Surgeon: [name], MD;  Location: [facility];  Service: Orthopedics;  Laterality: Right;  . TOTAL KNEE ARTHROPLASTY Left   . VAGINAL HYSTERECTOMY      There were no vitals filed for this visit.  Subjective Assessment - 10/20/18 1330    Subjective  Wen to cardiologist this morning and made medication changes and then was fit [facility] AFO at [facility] before coming to PT.    Pertinent History  left sided CVA 10/29/17, Chronic CHF, AFib, COPD, Chronic Kidney Disease stage III, hx of bil TKA, Lumbar surgery    Patient Stated Goals  move around better in the home and improve function    Currently in Pain?  No/denies    Pain Onset  More than a month ago                       [facility] Adult PT Treatment/Exercise - 10/20/18 1714      Transfers   Transfers  Sit to Stand;Stand to Sit    Sit to Stand  4: Min assist    Sit to Stand Details (indicate cue type and reason)  UE on parallel bar, assistance to bring head to midline when standing    Stand to Sit  4: Min assist    Stand to Sit Details  to bring hips down in midline      Ambulation/Gait   Ambulation/Gait  Yes    Ambulation/Gait Assistance  2: Max assist    Ambulation/Gait Assistance Details  LUE support on // bar and new AFO on RLE performed x 3 with cues to bring head and trunk to midline, for trunk elongation and weight shift of pelvis to L to allow RLE to fully advance (therapist provided 50% assistance to advance and place RLE) and for stabilization and full anterior weight shift onto RLE during stance phase    Ambulation Distance (Feet)  10 Feet     Assistive device  Parallel bars    Gait Pattern  Step-through pattern;Decreased step length - left;Decreased stance time - right;Decreased hip/knee flexion - right;Decreased dorsiflexion - right;Decreased weight shift to right;Right flexed knee in stance;Poor foot clearance - right;Scissoring    Ambulation Surface  Level;Indoor    Pre-Gait Activities  Pt fit with custom molded AFO in her current shoes which are  too narrow; pt did not report any significant pressure points.      Therapeutic Activites    Therapeutic Activities  Other Therapeutic Activities    Other Therapeutic Activities  discussed plan for fitting AFO with most appropriate shoe due to pt's current shoes too narrow causing increased pressure against feet and greater difficulty with donning.  Pt to bring 9W shoe next session to trial with AFO; if still too narrow and too long will recommend that patient purchase 8E or 8EE shoe for RLE.  Advised pt and son to NOT wear AFO right now until she has more appropriate sized shoe.               PT Education - 10/20/18 1720    Education Details  see TA    Person(s) Educated  Patient;Child(ren)    Methods  Explanation    Comprehension  Verbalized understanding       PT Short Term Goals - 10/20/18 1726      PT SHORT TERM GOAL #1   Title  Pt and family will be independent with supine, seated and standing HEP    Time  4    Status  On-going    Target Date  11/19/18      PT SHORT TERM GOAL #2   Title  Family will demonstrate safe use of Stedy for transfers at home    Time  4    Period  Weeks    Status  New    Target Date  11/19/18      PT SHORT TERM GOAL #3   Title  Pt will ambulate x 25' with RW with hand orthosis, trial AFO and mod-max A of one therapist    Time  4    Period  Weeks    Status  New    Target Date  11/19/18        PT Long Term Goals - 10/20/18 1725      PT LONG TERM GOAL #1   Title  Patient and family will be independent with final HEP  recommendations    Time  8    Period  Weeks    Status  On-going    Target Date  12/19/18      PT LONG TERM GOAL #2   Title  Patient will perform sit <> stand from wheelchair with min A and transfer stand pivot with RW and AFO with Mod A of one person    Baseline  Min A to stand, not transferring with RW yet    Time  8    Period  Weeks    Status  Partially Met    Target Date  12/19/18      PT LONG TERM GOAL #3   Title  Pt will perform bed mobility on flat mat with min A    Time  8    Period  Weeks    Status  On-going    Target Date  12/19/18      PT LONG TERM GOAL #4   Title  Family will demonstrate independent ability to don appropriate AFO with R shoe    Time  8    Period  Weeks    Status  On-going    Target Date  12/19/18      PT LONG TERM GOAL #5   Title  Patient will ambulate 40 feet or greater with min A, AFO, and LRAD to safely navigate around home.    Time  8    Period  Weeks    Status  On-going    Target Date  12/19/18            Plan - 10/20/18 1728    Clinical Impression Statement  Pt is making slow but steady progress.  Pt did not meet LTG for this cert period due to multiple missed sessions due to elevated BP and one hospitalization.  Pt has been able to return with more stable BP.  Pt received custom molded AFO and able to initiate fitting today but will require larger shoe in order to continue with functional transfer and gait training with AFO.  Pt is demonstrating greated activation of RLE in open and closed chain movements.  Therapy has submitted a LMN for a home standing assist to allow pt and caregivers to perform more independent and safe transfers.  Awaiting insurance decision regarding DME.  Pt will benefit from continued skilled PT services to address impairments and to maximize functional mobility independence and decrease falls risk and caregiver burden of care.    Personal Factors and Comorbidities  Age;Comorbidity 3+;Time since onset of  injury/illness/exacerbation    Examination-Activity Limitations  Lift;Dressing;Self Feeding;Transfers;Toileting;Stand;Locomotion Level;Reach Overhead;Bed Mobility    Rehab Potential  Fair    PT Frequency  2x / week    PT Duration  8 weeks    PT Treatment/Interventions  ADLs/Self Care Home Management;Electrical Stimulation;Gait training;Stair training;Functional mobility training;Balance training;Therapeutic exercise;Therapeutic activities;Neuromuscular re-education;Wheelchair mobility training;Manual techniques;Passive range of motion;Patient/family education;DME Instruction;Energy conservation;Orthotic Fit/Training    PT Next Visit Plan  MONITOR BP EVERY SESSION WITH MANUAL CUFF ON LUE; Check AFO with 9W shoe - if still too narrow or if too long recommend they find an 8EE (extra wide) shoe - will need new toe cap.  Training with new AFO - donning and skin checks; start wearing 2 hours/day.  Nustep with R hand orthosis.  Seated mirror therapy for LE.  Stedy transfers and sit <> stand on Stedy without UE support.  Seated on large rockerboard or inverted BOSU performing lateral tilts or anterior/posterior for sit <> stand.  When she receives her AFO begin using for transfers, standing and gait.  Work on decreasing assistance needed to perform level slideboard transfers to L and R.  Work on bed mobility on flat bed.  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 Acute CVA (cerebrovascular accident) ([place]) 10/01/2018  . Physical deconditioning 12/19/2017  . Chronic kidney disease (CKD), stage III (moderate) ([facility])   . Hypoalbuminemia due to protein-calorie malnutrition ([place])   . Intractable migraine without status migrainosus   . Reactive depression   . Hemiparesis affecting right side as late effect of stroke ([place])   . Slow transit constipation   .  PAF (paroxysmal atrial fibrillation) ([place])   . Dysphasia, post-stroke   . COPD (chronic obstructive pulmonary disease) ([place]) 10/29/2017  . Chronic diastolic CHF (congestive heart failure) ([place]) 10/29/2017  . Hypothyroidism 10/29/2017  . Essential hypertension 10/29/2017  . GERD (gastroesophageal reflux disease) 10/29/2017  . Anxiety 10/29/2017  . 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 Hyperlipidemia 04/20/2013    Rico Junker, PT, DPT 10/20/18    5:35 PM    [facility] [address] [place], [place], [address] Phone: [phone]   Fax:  [phone]  Name: Holly Flores MRN: [id number] Date of Birth: December 16, 1945

## 2018-10-20 NOTE — Patient Instructions (Addendum)
Your physician wants you to follow-up in: [facility] will receive a reminder letter in the mail two months in advance. If you don't receive a letter, please call our office to schedule the follow-up appointment.  Your physician has recommended you make the following change in your medication:   START ATORVASTATIN 40 MG DAILY   DECREASE TOPROL XL 25 MG DAILY   Thank you for choosing [facility]!!

## 2018-10-20 NOTE — Progress Notes (Signed)
Clinical Summary Holly Flores is a 73 y.o.female seem today for follow up of the following medical problems.   1. Syncope 06/2017 echo LVEF 60-65%, no WMAs, grade I diastolic dysfunction - [id number] monitor showed episode of aflutter. No arrhythmias that would explain syncope  - no recent episodes     2. 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 Repeat cath 10/2001 in setting of chest pain with LM normal, LAD [id number] mid systolic bridge, LCX patent, RCA patent. LVEF 60% by LVgram.    - admit 05/2017 to [facility] with chest pain and syncope - Trops negative. CT PE negative - no recurrent symptoms.   - episode yesterday while laying down. Pressure midchest, 6/10 in severity. +HA. Worst with deep breathing. Pressure just 2 minutes. Mainly occurs with laying down.    3. 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 No acute findings on imaging. Symptoms resolved   4. Paroxysmal aflutter - on eliquis, no bleeding on issues.   - occasional palpitations, about 1-2 months. Short in duration  5. HTN - has not taken meds yet today.  Past Medical History:  Diagnosis Date  . Anxiety   . Arthritis    "all over" (12/20/2017)  . Central retinal vein occlusion of left eye 01/22/2015  . CHF (congestive heart failure) ([place])   . Chronic bronchitis ([place])    "get it most years" (12/20/2017)  . Chronic lower back pain   . CKD (chronic kidney disease), stage II   . COPD (chronic obstructive pulmonary disease) ([place])   . CVA (cerebral vascular accident) ([place]) 12/30/2014   OCULAR  LEFT  EYE   . Depression   . Fibromyalgia   . GERD (gastroesophageal reflux disease)   . Goiter   . History of colonic polyps 05/14/2014  . Hyperlipidemia   . Hypertension   . Hypothyroidism   . Kidney cysts    left side  . 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 Migraine    "couple times/wk;  been having them for a long time" (12/20/2017)  . Myocardial infarction [facility])    "one dr said I did; one said I didn't;  don't remember when it was" (12/20/2017)  . 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 On home oxygen therapy    "3L; sleep w/it q night" (12/20/2017)  . Pneumonia    "several times" (12/20/2017)  . Shortness of breath   . Sleep apnea    "stopped mask when I got on the oxygen" (12/20/2017)  . Stroke [facility])      Allergies  Allergen Reactions  . Penicillins Hives, Itching and Rash    Has patient had a PCN reaction causing immediate rash, facial/tongue/throat swelling, SOB or lightheadedness with hypotension: Yes Has patient had a PCN reaction causing severe rash involving mucus membranes or skin necrosis: Yes Has patient had a PCN reaction that required hospitalization: No Has patient had a PCN reaction occurring within the last 10 years: No If all of the above answers are "NO", then may proceed with Cephalosporin use.   . Sulfa Antibiotics Swelling    Tongue swelling     Current Outpatient Medications  Medication Sig Dispense Refill  . albuterol (PROVENTIL HFA;VENTOLIN HFA) 108 (90 Base) MCG/ACT inhaler Inhale 2 puffs into the lungs every 6 (six) hours as needed (for wheezing/shortness of breath). (Patient taking differently: Inhale 2 puffs into the lungs  every 4 (four) hours as needed (for wheezing/shortness of breath). ) 1 Inhaler 1  . ALPRAZolam (XANAX) 1 MG tablet Take 1 mg by mouth 2 (two) times daily as needed for anxiety.    Marland Kitchen apixaban (ELIQUIS) 5 MG TABS tablet Take 1 tablet (5 mg total) by mouth 2 (two) times daily. 60 tablet 3  . cyclobenzaprine (FLEXERIL) 5 MG tablet Take 1 tablet (5 mg total) by mouth at bedtime. 30 tablet 0  . hydroxypropyl methylcellulose / hypromellose (ISOPTO TEARS / GONIOVISC) 2.5 % ophthalmic solution Place 1 drop into both eyes as needed for dry eyes.    . metoprolol succinate (TOPROL-XL) 25 MG 24 hr tablet Take 25 mg by mouth 2 (two) times a  day.     . naproxen (NAPROSYN) 500 MG tablet Take 500 mg by mouth 2 (two) times daily as needed for mild pain or moderate pain (with food/milk).    . potassium chloride SA (K-DUR,KLOR-CON) 20 MEQ tablet Take 1 tablet (20 mEq total) by mouth daily. (Patient taking differently: Take 20 mEq by mouth 2 (two) times daily. ) 30 tablet 1   No current facility-administered medications for this visit.      Past Surgical History:  Procedure Laterality Date  . ANTERIOR CERVICAL DECOMP/DISCECTOMY FUSION    . BACK SURGERY    . BREAST BIOPSY     left-non cancerous  . CARDIAC CATHETERIZATION    . CARPAL TUNNEL RELEASE Left   . CATARACT EXTRACTION W/PHACO  09/21/2011   Procedure: CATARACT EXTRACTION PHACO AND INTRAOCULAR LENS PLACEMENT (IOC);  Surgeon: Williams Che, MD;  Location: [facility];  Service: Ophthalmology;  Laterality: Left;  CDE:9.78  . CATARACT EXTRACTION W/PHACO Right 10/15/2014   Procedure: CATARACT EXTRACTION PHACO AND INTRAOCULAR LENS PLACEMENT ([place]);  Surgeon: Williams Che, MD;  Location: [facility];  Service: Ophthalmology;  Laterality: Right;  CDE:4.19  . [place]; 1972  . CHOLECYSTECTOMY OPEN    . COLONOSCOPY  2006   RMR: 1. Internal hemorroids, otherwise normal rectum. 2. Pedunculated polyp at 35 cm. reomved with snare. The remainder of teh colonic mucosa appeared normal.   . COLONOSCOPY  2011   Dr. Gala Romney: multiple ascending colon polyps and rectal polyp, adenomatous  . COLONOSCOPY N/A 05/31/2014   Procedure: COLONOSCOPY;  Surgeon: Daneil Dolin, MD;  Location: [facility];  Service: Endoscopy;  Laterality: N/A;  130  . COLONOSCOPY WITH PROPOFOL N/A 06/24/2017   Procedure: COLONOSCOPY WITH PROPOFOL;  Surgeon: Daneil Dolin, MD;  Location: [facility];  Service: Endoscopy;  Laterality: N/A;  2:15pm  . ESOPHAGOGASTRODUODENOSCOPY  2006   Dr. Gala Romney: Subtle Schatzkis ring, otherwise normal upper GI tract, aside from a small pyloric channell erosion, status post dilation  as described above.   Marland Kitchen FLEXIBLE SIGMOIDOSCOPY N/A 11/11/2017   Procedure: FLEXIBLE SIGMOIDOSCOPY;  Surgeon: Otis Brace, MD;  Location: [place];  Service: Gastroenterology;  Laterality: N/A;  . JOINT REPLACEMENT    . LUMBAR DISC SURGERY  X 2  . POLYPECTOMY  06/24/2017   Procedure: POLYPECTOMY;  Surgeon: Daneil Dolin, MD;  Location: [facility];  Service: Endoscopy;;  ascending  . POSTERIOR LUMBAR FUSION  ? date 1st fusion ; 07/2006   previous L4-5 Ray threaded fusion cage; Exploration of L4-5 fusion & PLIF 07/22/2006/notes 07/22/2006  . TONSILLECTOMY    . TOTAL KNEE ARTHROPLASTY Right 05/24/2013   Procedure: TOTAL KNEE ARTHROPLASTY;  Surgeon: [name], MD;  Location: [place];  Service: Orthopedics;  Laterality: Right;  . TOTAL KNEE ARTHROPLASTY Left   . VAGINAL HYSTERECTOMY       Allergies  Allergen Reactions  . Penicillins Hives, Itching and Rash    Has patient had a PCN reaction causing immediate rash, facial/tongue/throat swelling, SOB or lightheadedness with hypotension: Yes Has patient had a PCN reaction causing severe rash involving mucus membranes or skin necrosis: Yes Has patient had a PCN reaction that required hospitalization: No Has patient had a PCN reaction occurring within the last 10 years: No If all of the above answers are "NO", then may proceed with Cephalosporin use.   . Sulfa Antibiotics Swelling    Tongue swelling      Family History  Problem Relation Age of Onset  . Cancer Other   . Diabetes Other   . Heart disease Other   . Hypertension Other   . Asthma Other   . Alcoholism Other   . Thyroid disease Other   . Rheumatologic disease Other   . Stroke Other   . Hypercholesterolemia Other   . Colon cancer Neg Hx      Social History Ms. Flamenco reports that she quit smoking about 31 years ago. Her smoking use included cigarettes. She has a 5.00 pack-year smoking history. She has never used smokeless tobacco. Ms. Michalski reports  previous alcohol use.   Review of Systems CONSTITUTIONAL: No weight loss, fever, chills, weakness or fatigue.  HEENT: Eyes: No visual loss, blurred vision, double vision or yellow sclerae.No hearing loss, sneezing, congestion, runny nose or sore throat.  SKIN: No rash or itching.  CARDIOVASCULAR: per hpi RESPIRATORY: No shortness of breath, cough or sputum.  GASTROINTESTINAL: No anorexia, nausea, vomiting or diarrhea. No abdominal pain or blood.  GENITOURINARY: No burning on urination, no polyuria NEUROLOGICAL: No headache, dizziness, syncope, paralysis, ataxia, numbness or tingling in the extremities. No change in bowel or bladder control.  MUSCULOSKELETAL: No muscle, back pain, joint pain or stiffness.  LYMPHATICS: No enlarged nodes. No history of splenectomy.  PSYCHIATRIC: No history of depression or anxiety.  ENDOCRINOLOGIC: No reports of sweating, cold or heat intolerance. No polyuria or polydipsia.  Marland Kitchen   Physical Examination Today's Vitals   10/20/18 1036  BP: (!) 155/91  Pulse: (!) 52  SpO2: 98%  Weight: 185 lb (83.9 kg)  Height: 5\' 1"  (1.549 m)   Body mass index is 34.96 kg/m.  Gen: resting comfortably, no acute distress HEENT: no scleral icterus, pupils equal round and reactive, no palptable cervical adenopathy,  CV: RRR, no m/r/g, no jvd Resp: Clear to auscultation bilaterally GI: abdomen is soft, non-tender, non-distended, normal bowel sounds, no hepatosplenomegaly MSK: extremities are warm, no edema.  Skin: warm, no rash Psych: appropriate affect   Diagnostic Studies 10/2011 cath HEMODYNAMICS: Aortic systolic pressure: [id number].  Aortic diastolic pressure: 78.  Left ventricular systolic pressure: 123456.  Left ventricular diastolic pressure: 18.  CORONARY ANGIOGRAPHY:  1. Left main: The left main normal.  2. LAD: The LAD had a [id number] mid systolic bridge after a diagonal Haris Baack.  3. Left circumflex coronary artery: Free of significant disease.  4. Right coronary  artery: This a large dominant vessel which was free of  significant disease.  LEFT VENTRICULOGRAPHY: The RAO left ventriculogram was performed using 25  cc of Omnipaque dye at 12 cc per second. The overall LVEF was estimated at  greater than 60% without focal wall motion abnormality.  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 I suspect that her chest  pain is related to cholelithiasis.  The sheath was removed and pressure held on the groin to achieve hemostasis.  The patient left the cath lab in stable condition.  DISPOSITION: She will be discharged home today to be followed as an  outpatient. She will see me back in the office in several weeks. She is  cleared to undergo an elective cholecystectomy, at a low cardiovascular  risk. She left the laboratory in stable condition. Dr. Octavio Graves was  notified of these results.   12/2014 echo ------------------------------------------------------------------- Study Conclusions  - Left ventricle: The cavity size was normal. Wall thickness was normal. Systolic function was vigorous. The estimated ejection fraction was in the range of 65% to 70%. Doppler parameters are consistent with abnormal left ventricular relaxation (grade 1 diastolic dysfunction). - Left atrium: The atrium was mildly dilated.   06/2017 echo Study Conclusions  - Left ventricle: The cavity size was normal. Wall thickness was increased in a pattern of mild LVH. Systolic function was normal. The estimated ejection fraction was in the range of 60% to 65%. Wall motion was normal; there were no regional wall motion abnormalities. Doppler parameters are consistent with abnormal left ventricular relaxation (grade 1 diastolic dysfunction). - Mitral valve: Mildly calcified annulus. There was  trivial regurgitation. - Right atrium: Central venous pressure (est): 3 mm Hg. - Atrial septum: No defect or patent foramen ovale was identified. - Tricuspid valve: There was trivial regurgitation. - Pulmonary arteries: PA peak pressure: 7 mm Hg (S). - Pericardium, extracardiac: There was no pericardial effusion.    Assessment and Plan  1.Syncope - uncleaer etiology, no etiology noted by recent echo or event monitor - no recent episodes, continue to monitor  2. HTN - elevated today but has not taken meds yet, continue to monitor  3. History of CVA - should be on statin, start atorva 40mg  daily.    4. Aflutter - no recent symptoms, continue current meds including anticoag.   5. Chest pain - long history with prior negative ischemic evaluations. Recent symptoms atypical, continue to monitor at this time.   F/u 6 months  Arnoldo Lenis, M.D

## 2018-10-25 ENCOUNTER — Other Ambulatory Visit: Payer: Self-pay

## 2018-10-25 ENCOUNTER — Ambulatory Visit: Payer: Medicare Other | Admitting: Occupational Therapy

## 2018-10-25 ENCOUNTER — Ambulatory Visit: Payer: Medicare Other | Admitting: Physical Therapy

## 2018-10-25 ENCOUNTER — Encounter: Payer: Self-pay | Admitting: Physical Therapy

## 2018-10-25 VITALS — BP 135/90

## 2018-10-25 DIAGNOSIS — R293 Abnormal posture: Secondary | ICD-10-CM

## 2018-10-25 DIAGNOSIS — R2689 Other abnormalities of gait and mobility: Secondary | ICD-10-CM

## 2018-10-25 DIAGNOSIS — M6281 Muscle weakness (generalized): Secondary | ICD-10-CM

## 2018-10-25 DIAGNOSIS — R262 Difficulty in walking, not elsewhere classified: Secondary | ICD-10-CM

## 2018-10-25 DIAGNOSIS — I69351 Hemiplegia and hemiparesis following cerebral infarction affecting right dominant side: Secondary | ICD-10-CM | POA: Diagnosis not present

## 2018-10-25 DIAGNOSIS — R29818 Other symptoms and signs involving the nervous system: Secondary | ICD-10-CM

## 2018-10-25 NOTE — Therapy (Signed)
[facility] [address] [place], [place], [address] Phone: [phone]   Fax:  [phone]  Physical Therapy Treatment  Patient Details  Name: Holly Flores MRN: [id number]  Date of Birth: 03/18/1945 Referring Provider (PT): Octavio Graves, DO   Encounter Date: 10/25/2018  PT End of Session - 10/25/18 1424    Visit Number  22    Number of Visits  37    Date for PT Re-Evaluation  12/19/18    Authorization Type  Progress note every 10th visit; KX modifier at 15th visit.    PT Start Time  1152    PT Stop Time  1234    PT Time Calculation (min)  42 min    Equipment Utilized During Treatment  Other (comment)   afo   Activity Tolerance  Patient tolerated treatment well   elevated BP   Behavior During Therapy  WFL for tasks assessed/performed       Past Medical History:  Diagnosis Date  . Anxiety   . Arthritis    "all over" (12/20/2017)  . Central retinal vein occlusion of left eye 01/22/2015  . CHF (congestive heart failure) ([place])   . Chronic bronchitis ([place])    "get it most years" (12/20/2017)  . Chronic lower back pain   . CKD (chronic kidney disease), stage II   . COPD (chronic obstructive pulmonary disease) ([place])   . CVA (cerebral vascular accident) ([place]) 12/30/2014   OCULAR  LEFT  EYE   . Depression   . Fibromyalgia   . GERD (gastroesophageal reflux disease)   . Goiter   . History of colonic polyps 05/14/2014  . Hyperlipidemia   . Hypertension   . Hypothyroidism   . Kidney cysts    left side  . 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 Migraine    "couple times/wk; been having them for a long time" (12/20/2017)  . Myocardial infarction [facility])    "one dr said I did; one said I didn't;  don't remember when it was" (12/20/2017)  . 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 On home oxygen therapy    "3L; sleep w/it q night" (12/20/2017)  . Pneumonia    "several times"  (12/20/2017)  . Shortness of breath   . Sleep apnea    "stopped mask when I got on the oxygen" (12/20/2017)  . Stroke [facility])     Past Surgical History:  Procedure Laterality Date  . ANTERIOR CERVICAL DECOMP/DISCECTOMY FUSION    . BACK SURGERY    . BREAST BIOPSY     left-non cancerous  . CARDIAC CATHETERIZATION    . CARPAL TUNNEL RELEASE Left   . CATARACT EXTRACTION W/PHACO  09/21/2011   Procedure: CATARACT EXTRACTION PHACO AND INTRAOCULAR LENS PLACEMENT (IOC);  Surgeon: Williams Che, MD;  Location: [facility];  Service: Ophthalmology;  Laterality: Left;  CDE:9.78  . CATARACT EXTRACTION W/PHACO Right 10/15/2014   Procedure: CATARACT EXTRACTION PHACO AND INTRAOCULAR LENS PLACEMENT ([place]);  Surgeon: Williams Che, MD;  Location: [facility];  Service: Ophthalmology;  Laterality: Right;  CDE:4.19  . [place]; 1972  . CHOLECYSTECTOMY OPEN    . COLONOSCOPY  2006   RMR: 1. Internal hemorroids, otherwise normal rectum. 2. Pedunculated polyp at 35 cm. reomved with snare. The remainder of teh colonic mucosa appeared normal.   . COLONOSCOPY  2011   Dr. Gala Romney: multiple ascending colon polyps and rectal polyp, adenomatous  .  COLONOSCOPY N/A 05/31/2014   Procedure: COLONOSCOPY;  Surgeon: Daneil Dolin, MD;  Location: [facility];  Service: Endoscopy;  Laterality: N/A;  130  . COLONOSCOPY WITH PROPOFOL N/A 06/24/2017   Procedure: COLONOSCOPY WITH PROPOFOL;  Surgeon: Daneil Dolin, MD;  [facility];  Service: Endoscopy;  Laterality: N/A;  2:15pm  . ESOPHAGOGASTRODUODENOSCOPY  2006   Dr. Gala Romney: Subtle Schatzkis ring, otherwise normal upper GI tract, aside from a small pyloric channell erosion, status post dilation as described above.   Marland Kitchen FLEXIBLE SIGMOIDOSCOPY N/A 11/11/2017   Procedure: FLEXIBLE SIGMOIDOSCOPY;  Surgeon: Otis Brace, MD;  Location: [facility];  Service: Gastroenterology;  Laterality: N/A;  . JOINT REPLACEMENT    . LUMBAR DISC SURGERY  X 2  . POLYPECTOMY   06/24/2017   Procedure: POLYPECTOMY;  Surgeon: Daneil Dolin, MD;  [facility];  Service: Endoscopy;;  ascending  . POSTERIOR LUMBAR FUSION  ? date 1st fusion ; 07/2006   previous L4-5 Ray threaded fusion cage; Exploration of L4-5 fusion & PLIF 07/22/2006/notes 07/22/2006  . TONSILLECTOMY    . TOTAL KNEE ARTHROPLASTY Right 05/24/2013   Procedure: TOTAL KNEE ARTHROPLASTY;  Surgeon: [name], MD;  Location: [facility];  Service: Orthopedics;  Laterality: Right;  . TOTAL KNEE ARTHROPLASTY Left   . VAGINAL HYSTERECTOMY      Vitals:   10/25/18 1207  BP: 135/90    Subjective Assessment - 10/25/18 1202    Subjective  Patient did not bring larger shoe today to trial AFO.    Pertinent History  left sided CVA 10/29/17, Chronic CHF, AFib, COPD, Chronic Kidney Disease stage III, hx of bil TKA, Lumbar surgery    Patient Stated Goals  move around better in the home and improve function    Pain Onset  More than a month ago                       [facility] Adult PT Treatment/Exercise - 10/25/18 1351      Transfers   Transfers  Sit to Stand;Stand to Sit    Sit to Stand  4: Min assist    Sit to Stand Details  Manual facilitation for weight shifting;Manual facilitation for placement;Manual facilitation for weight bearing    Sit to Stand Details (indicate cue type and reason)  Therapist supporting RUE, positioning RLE before standing - pt RLE with tendency to be positioned in ER. Pt able to grab farther bar using LUE and help pull herself to stand. Cueing to be in midline.      Stand to Sit  4: Min assist    Stand to Sit Details  hips in midline with weight shifting towards R    Transfer via Lift Equipment  Stedy    Comments  Performed 3 reps sit <> stand from seated in [place], cues for midline and glute activation. Pt unable to straighten RLE into extension in standing. Pt using LUE to help pull to stand, therapist supporting RUE       Therapeutic Activites    Other Therapeutic  Activities  Educated pt and pt's daughter to bring [place] to next appointment in order to use AFO and begin wearing at home(pt's current shoes are too small). Pt and daughter verbalized understanding .         Neuro Re-ed    Neuro Re-ed Details   Seated at edge of mat with wedge under RLE to promote weight shifting to LLE -  placed mirror to face LLE so that pt could see "right leg" moving in the mirror.  Performed 10-15 reps of mirror therapy with pt focusing on visualizing "right leg" performing these movements: heel lift, toe lifts, 3 x 10 reps of LAQ, hamstring curls, hip ABD<>ADD, hip flexion.               PT Education - 10/25/18 1431    Education Details  bringing AFO and 9W shoe to next session.    Person(s) Educated  Patient;Child(ren)    Methods  Explanation    Comprehension  Verbalized understanding       PT Short Term Goals - 10/20/18 1726      PT SHORT TERM GOAL #1   Title  Pt and family will be independent with supine, seated and standing HEP    Time  4    Status  On-going    Target Date  11/19/18      PT SHORT TERM GOAL #2   Title  Family will demonstrate safe use of Stedy for transfers at home    Time  4    Period  Weeks    Status  New    Target Date  11/19/18      PT SHORT TERM GOAL #3   Title  Pt will ambulate x 25' with RW with hand orthosis, trial AFO and mod-max A of one therapist    Time  4    Period  Weeks    Status  New    Target Date  11/19/18        PT Long Term Goals - 10/20/18 1725      PT LONG TERM GOAL #1   Title  Patient and family will be independent with final HEP recommendations    Time  8    Period  Weeks    Status  On-going    Target Date  12/19/18      PT LONG TERM GOAL #2   Title  Patient will perform sit <> stand from wheelchair with min A and transfer stand pivot with RW and AFO with Mod A of one person    Baseline  Min A to stand, not transferring with RW yet    Time  8    Period  Weeks    Status  Partially Met     Target Date  12/19/18      PT LONG TERM GOAL #3   Title  Pt will perform bed mobility on flat mat with min A    Time  8    Period  Weeks    Status  On-going    Target Date  12/19/18      PT LONG TERM GOAL #4   Title  Family will demonstrate independent ability to don appropriate AFO with R shoe    Time  8    Period  Weeks    Status  On-going    Target Date  12/19/18      PT LONG TERM GOAL #5   Title  Patient will ambulate 40 feet or greater with min A, AFO, and LRAD to safely navigate around home.    Time  8    Period  Weeks    Status  On-going    Target Date  12/19/18            Plan - 10/25/18 1432    Clinical Impression Statement  Pt did not bring 9W shoe in to today's session  today - unable to progress with standing tolerance today with AFO and progress with a  wearing schedule for home. Instructed pt and daughter of importance of bringing it to next session to determine if a 9W size would be the approrpriate size shoe. At beginning of session, pt unable to activate R quads and kick RLE to assist with [facility] transfer set up - at end of session and after mirror therapy, pt demonstrated ability to perform 5 reps of R quad activation by kicking against gravity. Will continue to progress towards LTGs and progress towards standing tolerance and gait training when pt has appropriate shoe for AFO.    Personal Factors and Comorbidities  Age;Comorbidity 3+;Time since onset of injury/illness/exacerbation    Examination-Activity Limitations  Lift;Dressing;Self Feeding;Transfers;Toileting;Stand;Locomotion Level;Reach Overhead;Bed Mobility    Rehab Potential  Fair    PT Frequency  2x / week    PT Duration  8 weeks    PT Treatment/Interventions  ADLs/Self Care Home Management;Electrical Stimulation;Gait training;Stair training;Functional mobility training;Balance training;Therapeutic exercise;Therapeutic activities;Neuromuscular re-education;Wheelchair mobility training;Manual  techniques;Passive range of motion;Patient/family education;DME Instruction;Energy conservation;Orthotic Fit/Training    PT Next Visit Plan  MONITOR BP EVERY SESSION WITH MANUAL CUFF ON LUE; Check AFO with 9W shoe - if still too narrow or if too long recommend they find an 8EE (extra wide) shoe - will need new toe cap.  Training with new AFO - donning and skin checks; start wearing 2 hours/day.  Nustep with R hand orthosis.  Seated mirror therapy for LE.  Stedy transfers and sit <> stand on Stedy without UE support.  Seated on large rockerboard or inverted BOSU performing lateral tilts or anterior/posterior for sit <> stand.  When she receives her AFO begin using for transfers, standing and gait.  Work on decreasing assistance needed to perform level slideboard transfers to L and R.  Work on bed mobility on flat bed.  Add to HEP: WB exercises for mm activation    Consulted and Agree with Plan of Care  Patient;Family member/caregiver    Family Member Consulted  daughter       Patient will benefit from skilled therapeutic intervention in order to improve the following deficits and impairments:  Pain, Postural dysfunction, Impaired tone, Decreased mobility, Decreased activity tolerance, Decreased range of motion, Decreased strength, Impaired UE functional use, Difficulty walking, Decreased balance, Abnormal gait, Decreased endurance  Visit Diagnosis: Muscle weakness (generalized)  Other symptoms and signs involving the nervous system  Difficulty in walking, not elsewhere classified  Abnormal posture  Other abnormalities of gait and mobility     Problem List Patient Active Problem List   Diagnosis Date Noted  . Acute CVA (cerebrovascular accident) ([place]) 10/01/2018  . Physical deconditioning 12/19/2017  . Chronic kidney disease (CKD), stage III (moderate) ([facility])   . Hypoalbuminemia due to protein-calorie malnutrition ([place])   . Intractable migraine without status migrainosus   . Reactive  depression   . Hemiparesis affecting right side as late effect of stroke ([place])   . Slow transit constipation   . PAF (paroxysmal atrial fibrillation) ([place])   . Dysphasia, post-stroke   . COPD (chronic obstructive pulmonary disease) ([place]) 10/29/2017  . Chronic diastolic CHF (congestive heart failure) ([place]) 10/29/2017  . Hypothyroidism 10/29/2017  . Essential hypertension 10/29/2017  . GERD (gastroesophageal reflux disease) 10/29/2017  . Anxiety 10/29/2017  . 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 Hyperlipidemia 04/20/2013    Arliss Journey, PT, DPT 10/25/2018, 2:37 PM  [facility] [address] [place], [place], [address] Phone: [phone]   Fax:  [phone]  Name: Holly Flores MRN: [id number] Date of Birth: December 06, 1945

## 2018-10-25 NOTE — Patient Instructions (Signed)
Bring 9W shoe to next appointment in order to try out brace with!

## 2018-10-27 ENCOUNTER — Other Ambulatory Visit: Payer: Self-pay

## 2018-10-27 ENCOUNTER — Ambulatory Visit: Payer: Medicare Other | Admitting: Physical Therapy

## 2018-10-27 ENCOUNTER — Encounter: Payer: Self-pay | Admitting: Physical Therapy

## 2018-10-27 ENCOUNTER — Ambulatory Visit: Payer: Medicare Other | Admitting: Occupational Therapy

## 2018-10-27 VITALS — BP 150/95

## 2018-10-27 DIAGNOSIS — M6281 Muscle weakness (generalized): Secondary | ICD-10-CM

## 2018-10-27 DIAGNOSIS — R29818 Other symptoms and signs involving the nervous system: Secondary | ICD-10-CM

## 2018-10-27 DIAGNOSIS — I69351 Hemiplegia and hemiparesis following cerebral infarction affecting right dominant side: Secondary | ICD-10-CM

## 2018-10-27 DIAGNOSIS — M21371 Foot drop, right foot: Secondary | ICD-10-CM

## 2018-10-27 DIAGNOSIS — R293 Abnormal posture: Secondary | ICD-10-CM

## 2018-10-27 DIAGNOSIS — M25612 Stiffness of left shoulder, not elsewhere classified: Secondary | ICD-10-CM

## 2018-10-27 DIAGNOSIS — R262 Difficulty in walking, not elsewhere classified: Secondary | ICD-10-CM

## 2018-10-27 NOTE — Therapy (Signed)
[facility] [address] [place], [place], [address] Phone: [phone]   Fax:  [phone]  Physical Therapy Treatment  Patient Details  Name: Holly Flores MRN: [id number] Date of Birth: 1945-09-11 Referring Provider (PT): Octavio Graves, DO   Encounter Date: 10/27/2018  PT End of Session - 10/27/18 1258    Visit Number  23    Number of Visits  37    Date for PT Re-Evaluation  12/19/18    Authorization Type  Progress note every 10th visit; KX modifier at 15th visit.    PT Start Time  1146    PT Stop Time  1225    PT Time Calculation (min)  39 min    Equipment Utilized During Treatment  Other (comment)   AFO, RW with R hand orthosis   Activity Tolerance  Patient tolerated treatment well   elevated BP   Behavior During Therapy  WFL for tasks assessed/performed       Past Medical History:  Diagnosis Date  . Anxiety   . Arthritis    "all over" (12/20/2017)  . Central retinal vein occlusion of left eye 01/22/2015  . CHF (congestive heart failure) ([place])   . Chronic bronchitis ([place])    "get it most years" (12/20/2017)  . Chronic lower back pain   . CKD (chronic kidney disease), stage II   . COPD (chronic obstructive pulmonary disease) ([place])   . CVA (cerebral vascular accident) ([place]) 12/30/2014   OCULAR  LEFT  EYE   . Depression   . Fibromyalgia   . GERD (gastroesophageal reflux disease)   . Goiter   . History of colonic polyps 05/14/2014  . Hyperlipidemia   . Hypertension   . Hypothyroidism   . Kidney cysts    left side  . 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 Migraine    "couple times/wk; been having them for a long time" (12/20/2017)  . Myocardial infarction [facility])    "one dr said I did; one said I didn't;  don't remember when it was" (12/20/2017)  . 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 On home oxygen therapy    "3L; sleep w/it q night" (12/20/2017)  . Pneumonia     "several times" (12/20/2017)  . Shortness of breath   . Sleep apnea    "stopped mask when I got on the oxygen" (12/20/2017)  . Stroke [facility])     Past Surgical History:  Procedure Laterality Date  . ANTERIOR CERVICAL DECOMP/DISCECTOMY FUSION    . BACK SURGERY    . BREAST BIOPSY     left-non cancerous  . CARDIAC CATHETERIZATION    . CARPAL TUNNEL RELEASE Left   . CATARACT EXTRACTION W/PHACO  09/21/2011   Procedure: CATARACT EXTRACTION PHACO AND INTRAOCULAR LENS PLACEMENT (IOC);  Surgeon: Williams Che, MD;  Location: [facility];  Service: Ophthalmology;  Laterality: Left;  CDE:9.78  . CATARACT EXTRACTION W/PHACO Right 10/15/2014   Procedure: CATARACT EXTRACTION PHACO AND INTRAOCULAR LENS PLACEMENT ([place]);  Surgeon: Williams Che, MD;  Location: [facility];  Service: Ophthalmology;  Laterality: Right;  CDE:4.19  . [place]; 1972  . CHOLECYSTECTOMY OPEN    . COLONOSCOPY  2006   RMR: 1. Internal hemorroids, otherwise normal rectum. 2. Pedunculated polyp at 35 cm. reomved with snare. The remainder of teh colonic mucosa appeared normal.   . COLONOSCOPY  2011   Dr. Gala Romney: multiple ascending colon polyps and rectal  polyp, adenomatous  . COLONOSCOPY N/A 05/31/2014   Procedure: COLONOSCOPY;  Surgeon: Daneil Dolin, MD;  Location: [facility];  Service: Endoscopy;  Laterality: N/A;  130  . COLONOSCOPY WITH PROPOFOL N/A 06/24/2017   Procedure: COLONOSCOPY WITH PROPOFOL;  Surgeon: Daneil Dolin, MD;  [facility];  Service: Endoscopy;  Laterality: N/A;  2:15pm  . ESOPHAGOGASTRODUODENOSCOPY  2006   Dr. Gala Romney: Subtle Schatzkis ring, otherwise normal upper GI tract, aside from a small pyloric channell erosion, status post dilation as described above.   Marland Kitchen FLEXIBLE SIGMOIDOSCOPY N/A 11/11/2017   Procedure: FLEXIBLE SIGMOIDOSCOPY;  Surgeon: Otis Brace, MD;  Location: [place];  Service: Gastroenterology;  Laterality: N/A;  . JOINT REPLACEMENT    . LUMBAR DISC SURGERY  X  2  . POLYPECTOMY  06/24/2017   Procedure: POLYPECTOMY;  Surgeon: Daneil Dolin, MD;  Location: [facility];  Service: Endoscopy;;  ascending  . POSTERIOR LUMBAR FUSION  ? date 1st fusion ; 07/2006   previous L4-5 Ray threaded fusion cage; Exploration of L4-5 fusion & PLIF 07/22/2006/notes 07/22/2006  . TONSILLECTOMY    . TOTAL KNEE ARTHROPLASTY Right 05/24/2013   Procedure: TOTAL KNEE ARTHROPLASTY;  Surgeon: [name], MD;  Location: [place];  Service: Orthopedics;  Laterality: Right;  . TOTAL KNEE ARTHROPLASTY Left   . VAGINAL HYSTERECTOMY      Vitals:   10/27/18 1150  BP: (!) 150/95    Subjective Assessment - 10/27/18 1150    Subjective  Pt wearing larger shoes today, brought in AFO.  Woke up with R eye bright red, not itching, no drainage.  Sore around eye.    Pertinent History  left sided CVA 10/29/17, Chronic CHF, AFib, COPD, Chronic Kidney Disease stage III, hx of bil TKA, Lumbar surgery    Patient Stated Goals  move around better in the home and improve function    Pain Onset  More than a month ago                       [facility] Adult PT Treatment/Exercise - 10/27/18 1248      Transfers   Transfers  Sit to Stand;Stand to Sit    Sit to Stand  4: Min assist;3: Mod assist    Sit to Stand Details (indicate cue type and reason)  from mat with UE support on RW wearing new AFO.  First transfer pt requird mod A due to R lateropulsion; second transfer pt able to maintain head and trunk in midline and only required min A to stand.      Stand to Sit  4: Min assist    Stand to Sit Details  holding RW with LUE; therapist assisting with releasing RUE      Ambulation/Gait   Ambulation/Gait  Yes    Ambulation/Gait Assistance  2: Max assist    Ambulation/Gait Assistance Details  Gait with new AFO with RW with R hand orthosis.  Therapist on R side providing assistance to control direction of RW, cues for weight shifting to L side to allow RLE to advance, placement of RLE and  minimal assistance to maintain stability of R knee and upright trunk during R stance    Ambulation Distance (Feet)  20 Feet    Assistive device  Rolling walker    Gait Pattern  Step-to pattern;Decreased step length - left;Decreased stance time - right;Decreased hip/knee flexion - right;Decreased dorsiflexion - right;Decreased weight shift to left;Trunk flexed  Ambulation Surface  Level;Indoor    Pre-Gait Activities  once AFO donned performed pre-gait with UE support on RW performing controlled weight shifting and WB through RLE for increased activation and stability while stepping LLE forwards and back x 5 reps with therapist providing mod A on R side to control placement of R foot, placement of RW and support at trunk during R stance.  Verbal cues for sequencing      Therapeutic Activites    Therapeutic Activities  Other Therapeutic Activities    Other Therapeutic Activities  Donned solid ankle AFO with 9.5 W shoe from home; required increased assistance and use of shoe horn to place foot in AFO into shoe due to tight fit.  Even with larger size and W toe box is still too narrow and shallow and pt will benefit from larger, EE shoe.  Assisted son with finding two different sized shoes online - 9EE for right, 8W for left in men's sizes.  Provided son with print out of shoe with sizes written on it.  Son to purchase today             PT Education - 10/27/18 1258    Education Details  purchasing shoes two different sizes    Person(s) Educated  Patient;Child(ren)    Methods  Explanation;Handout    Comprehension  Verbalized understanding       PT Short Term Goals - 10/20/18 1726      PT SHORT TERM GOAL #1   Title  Pt and family will be independent with supine, seated and standing HEP    Time  4    Status  On-going    Target Date  11/19/18      PT SHORT TERM GOAL #2   Title  Family will demonstrate safe use of Stedy for transfers at home    Time  4    Period  Weeks    Status  New     Target Date  11/19/18      PT SHORT TERM GOAL #3   Title  Pt will ambulate x 25' with RW with hand orthosis, trial AFO and mod-max A of one therapist    Time  4    Period  Weeks    Status  New    Target Date  11/19/18        PT Long Term Goals - 10/20/18 1725      PT LONG TERM GOAL #1   Title  Patient and family will be independent with final HEP recommendations    Time  8    Period  Weeks    Status  On-going    Target Date  12/19/18      PT LONG TERM GOAL #2   Title  Patient will perform sit <> stand from wheelchair with min A and transfer stand pivot with RW and AFO with Mod A of one person    Baseline  Min A to stand, not transferring with RW yet    Time  8    Period  Weeks    Status  Partially Met    Target Date  12/19/18      PT LONG TERM GOAL #3   Title  Pt will perform bed mobility on flat mat with min A    Time  8    Period  Weeks    Status  On-going    Target Date  12/19/18      PT LONG TERM GOAL #4  Title  Family will demonstrate independent ability to don appropriate AFO with R shoe    Time  8    Period  Weeks    Status  On-going    Target Date  12/19/18      PT LONG TERM GOAL #5   Title  Patient will ambulate 40 feet or greater with min A, AFO, and LRAD to safely navigate around home.    Time  8    Period  Weeks    Status  On-going    Target Date  12/19/18            Plan - 10/27/18 1259    Personal Factors and Comorbidities  Age;Comorbidity 3+;Time since onset of injury/illness/exacerbation    Examination-Activity Limitations  Lift;Dressing;Self Feeding;Transfers;Toileting;Stand;Locomotion Level;Reach Overhead;Bed Mobility    Rehab Potential  Fair    PT Frequency  2x / week    PT Duration  8 weeks    PT Treatment/Interventions  ADLs/Self Care Home Management;Electrical Stimulation;Gait training;Stair training;Functional mobility training;Balance training;Therapeutic exercise;Therapeutic activities;Neuromuscular re-education;Wheelchair  mobility training;Manual techniques;Passive range of motion;Patient/family education;DME Instruction;Energy conservation;Orthotic Fit/Training    PT Next Visit Plan  MONITOR BP EVERY SESSION WITH MANUAL CUFF ON LUE; Training with new AFO - donning and skin checks; start wearing 2 hours/day.  Sit<>stand from mat with RW, stand pivot transfers with RW, gait with RW.  Nustep with R hand orthosis.  Seated mirror therapy for LE.  Stedy transfers and sit <> stand on Stedy without UE support.  Seated on large rockerboard or inverted BOSU performing lateral tilts or anterior/posterior for sit <> stand. Work on bed mobility on flat bed.  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 Acute CVA (cerebrovascular accident) ([place]) 10/01/2018  . Physical deconditioning 12/19/2017  . Chronic kidney disease (CKD), stage III (moderate) (HCC)   . Hypoalbuminemia due to protein-calorie malnutrition ([place])   . Intractable migraine without status migrainosus   . Reactive depression   . Hemiparesis affecting right side as late effect of stroke ([place])   . Slow transit constipation   . PAF (paroxysmal atrial fibrillation)  ([place])   . Dysphasia, post-stroke   . COPD (chronic obstructive pulmonary disease) ([place]) 10/29/2017  . Chronic diastolic CHF (congestive heart failure) ([place]) 10/29/2017  . Hypothyroidism 10/29/2017  . Essential hypertension 10/29/2017  . GERD (gastroesophageal reflux disease) 10/29/2017  . Anxiety 10/29/2017  . 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 Hyperlipidemia 04/20/2013    Rico Junker, PT, DPT 10/27/18    1:02 PM    [facility] [address] [place], [place], [address] Phone: [phone]   Fax:  [phone]  Name: Holly Flores MRN: [id number] Date of Birth: 07/22/1945

## 2018-10-27 NOTE — Therapy (Signed)
[facility] [address] [place], [place], [address] Phone: [phone]   Fax:  [phone]  Occupational Therapy Treatment  Patient Details  Name: Holly Flores MRN: [id number] Date of Birth: [birth year] Referring Provider (OT): Dr. Octavio Graves   Encounter Date: 10/27/2018  OT End of Session - 10/27/18 1223    Visit Number  5    Number of Visits  16    Date for OT Re-Evaluation  11/23/18    Authorization Type  [employer] primary, [employer] secondary    Authorization - Visit Number  5    Authorization - Number of Visits  10    OT Start Time  1100    OT Stop Time  1145    OT Time Calculation (min)  45 min    Activity Tolerance  Patient tolerated treatment well    Behavior During Therapy  [facility] for tasks assessed/performed       Past Medical History:  Diagnosis Date  . Anxiety   . Arthritis    "all over" (12/20/2017)  . Central retinal vein occlusion of left eye 01/22/2015  . CHF (congestive heart failure) ([place])   . Chronic bronchitis ([place])    "get it most years" (12/20/2017)  . Chronic lower back pain   . CKD (chronic kidney disease), stage II   . COPD (chronic obstructive pulmonary disease) ([place])   . CVA (cerebral vascular accident) ([place]) 12/30/2014   OCULAR  LEFT  EYE   . Depression   . Fibromyalgia   . GERD (gastroesophageal reflux disease)   . Goiter   . History of colonic polyps 05/14/2014  . Hyperlipidemia   . Hypertension   . Hypothyroidism   . Kidney cysts    left side  . 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 Migraine    "couple times/wk; been having them for a long time" (12/20/2017)  . Myocardial infarction [facility])    "one dr said I did; one said I didn't;  don't remember when it was" (12/20/2017)  . 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 On home oxygen therapy    "3L; sleep w/it q night" (12/20/2017)  . Pneumonia    "several times" (12/20/2017)  . Shortness of  breath   . Sleep apnea    "stopped mask when I got on the oxygen" (12/20/2017)  . Stroke [facility])     Past Surgical History:  Procedure Laterality Date  . ANTERIOR CERVICAL DECOMP/DISCECTOMY FUSION    . BACK SURGERY    . BREAST BIOPSY     left-non cancerous  . CARDIAC CATHETERIZATION    . CARPAL TUNNEL RELEASE Left   . CATARACT EXTRACTION W/PHACO  09/21/2011   Procedure: CATARACT EXTRACTION PHACO AND INTRAOCULAR LENS PLACEMENT (IOC);  Surgeon: Williams Che, MD;  Location: [facility];  Service: Ophthalmology;  Laterality: Left;  CDE:9.78  . CATARACT EXTRACTION W/PHACO Right 10/15/2014   Procedure: CATARACT EXTRACTION PHACO AND INTRAOCULAR LENS PLACEMENT ([place]);  Surgeon: Williams Che, MD;  Location: [facility];  Service: Ophthalmology;  Laterality: Right;  CDE:4.19  . [place]; 1972  . CHOLECYSTECTOMY OPEN    . COLONOSCOPY  2006   RMR: 1. Internal hemorroids, otherwise normal rectum. 2. Pedunculated polyp at 35 cm. reomved with snare. The remainder of teh colonic mucosa appeared normal.   . COLONOSCOPY  2011   Dr. Gala Romney: multiple ascending colon polyps and rectal polyp, adenomatous  . COLONOSCOPY N/A 05/31/2014  Procedure: COLONOSCOPY;  Surgeon: Daneil Dolin, MD;  [facility];  Service: Endoscopy;  Laterality: N/A;  130  . COLONOSCOPY WITH PROPOFOL N/A 06/24/2017   Procedure: COLONOSCOPY WITH PROPOFOL;  Surgeon: Daneil Dolin, MD;  [facility];  Service: Endoscopy;  Laterality: N/A;  2:15pm  . ESOPHAGOGASTRODUODENOSCOPY  2006   Dr. Gala Romney: Subtle Schatzkis ring, otherwise normal upper GI tract, aside from a small pyloric channell erosion, status post dilation as described above.   Marland Kitchen FLEXIBLE SIGMOIDOSCOPY N/A 11/11/2017   Procedure: FLEXIBLE SIGMOIDOSCOPY;  Surgeon: Otis Brace, MD;  Location: [place];  Service: Gastroenterology;  Laterality: N/A;  . JOINT REPLACEMENT    . LUMBAR DISC SURGERY  X 2  . POLYPECTOMY  06/24/2017   Procedure:  POLYPECTOMY;  Surgeon: Daneil Dolin, MD;  [facility];  Service: Endoscopy;;  ascending  . POSTERIOR LUMBAR FUSION  ? date 1st fusion ; 07/2006   previous L4-5 Ray threaded fusion cage; Exploration of L4-5 fusion & PLIF 07/22/2006/notes 07/22/2006  . TONSILLECTOMY    . TOTAL KNEE ARTHROPLASTY Right 05/24/2013   Procedure: TOTAL KNEE ARTHROPLASTY;  Surgeon: [name], MD;  Location: [facility];  Service: Orthopedics;  Laterality: Right;  . TOTAL KNEE ARTHROPLASTY Left   . VAGINAL HYSTERECTOMY      There were no vitals filed for this visit.  Subjective Assessment - 10/27/18 1107    Subjective   I woke up w/ my eye hurting (Rt eye is red)    Pertinent History  CVA 10/2017. PMH: CHF, COPD, CKD (Stage II), fibromyalgia, depression. (Husband, who is primary caregiver, also has cancer)    Limitations  montior BP, obesity, needs DME to transfer (sliding board or stand assist)    Currently in Pain?  Yes    Pain Score  5     Pain Location  Eye    Pain Orientation  Right    Pain Descriptors / Indicators  Aching    Pain Type  Acute pain    Pain Onset  Today    Pain Frequency  Constant       BP = 150/100. Pt also had redness in Rt eye, but denies itching and no pus noted. Pt denies symptoms of CVA. Transfer w/c to mat using sliding board and min assist to Rt side.  Pt's son present for part of treatment session to review HEP and hand placement for P/ROM to RUE in sh flexion. Also reviewed ex's for LUE including: sh flexion in supine, and ER stretch.  Seated: worked on trunk control w/ posterior leaning to come back up to center, reaching to floor and cross reaching to Rt side w/ cues to use RUE to help support patient. Pt tends to fall to Rt side when crossing reaching to Rt side w/ LUE. Also performed some low range AA/ROM w/ RUE to cross reach to Lt side w/ mod assist.                       OT Short Term Goals - 09/29/18 1423      OT SHORT TERM GOAL #1   Title   Independent with HEP for RUE (Caregiver assisted and self ROM as able)    Time  4    Period  Weeks    Status  On-going      OT SHORT TERM GOAL #2   Title  Pt/family to verbalize understanding with A/E (rocker knife, adapted shoes,  extended brush, LH sponge, etc) to increase independence with BADLS    Time  4    Period  Weeks    Status  On-going      OT SHORT TERM GOAL #3   Title  Pt/family to verbalize understanding with use of stand assist (Steady) with toileting and transfers to decrease burden of care on caregiver    Time  4    Period  Weeks    Status  On-going      OT SHORT TERM GOAL #4   Title  Pt to demo sliding board transfers consistently w/ mod assist or less for level transfer from w/c to/from bed    Time  4    Period  Weeks    Status  Achieved        OT Long Term Goals - 08/23/18 1618      OT LONG TERM GOAL #1   Title  Pt to demo independence with HEP for Lt shoulder (once pt seen by ortho MD)    Time  8    Period  Weeks    Status  New      OT LONG TERM GOAL #2   Title  Pt to be min assist for UE dressing and bathing    Time  8    Period  Weeks    Status  New      OT LONG TERM GOAL #3   Title  Pt to cut food and brush hair w/ A/E independently    Time  8    Period  Weeks    Status  New      OT LONG TERM GOAL #4   Title  Pt to perform toilet transfer w/ mod assist using correct DME prn    Time  8    Period  Weeks    Status  New      OT LONG TERM GOAL #5   Title  Pt to perform LE dressing at max assist, LE bathing at min assist w/ use of LH sponge    Time  8    Period  Weeks    Status  New            Plan - 10/27/18 1223    Clinical Impression Statement  Pt w/ slight decline since last seen due to gap in O.T. service.    Occupational performance deficits (Please refer to evaluation for details):  ADL's;IADL's;Rest and Sleep    Body Structure / Function / Physical Skills  ADL;ROM;Vision;IADL;Edema;Body  mechanics;Endurance;Mobility;Strength;Coordination;Obesity;UE functional use;Pain;Decreased knowledge of use of DME    Rehab Potential  Fair    Comorbidities impacting occupational performance description:  obesity, CHF, COPD, Lt shoulder limitations    OT Frequency  2x / week    OT Duration  8 weeks    OT Treatment/Interventions  Self-care/ADL training;Therapeutic exercise;Functional Mobility Training;Neuromuscular education;Splinting;Manual Therapy;Therapeutic activities;DME and/or AE instruction;Aquatic Therapy;Passive range of motion;Patient/family education;Visual/perceptual remediation/compensation    Plan  Monitor BP, show family Steady Assist w/ toilet transfers and clothes management for toileting   Consulted and Agree with Plan of Care  Patient       Patient will benefit from skilled therapeutic intervention in order to improve the following deficits and impairments:   Body Structure / Function / Physical Skills: ADL, ROM, Vision, IADL, Edema, Body mechanics, Endurance, Mobility, Strength, Coordination, Obesity, UE functional use, Pain, Decreased knowledge of use of DME       Visit Diagnosis: Abnormal posture  Flaccid  hemiplegia of right dominant side as late effect of cerebral infarction (HCC)  Muscle weakness (generalized)  Stiffness of left shoulder, not elsewhere classified    Problem List Patient Active Problem List   Diagnosis Date Noted  . Acute CVA (cerebrovascular accident) ([place]) 10/01/2018  . Physical deconditioning 12/19/2017  . Chronic kidney disease (CKD), stage III (moderate) (HCC)   . Hypoalbuminemia due to protein-calorie malnutrition ([place])   . Intractable migraine without status migrainosus   . Reactive depression   . Hemiparesis affecting right side as late effect of stroke ([place])   . Slow transit constipation   . PAF (paroxysmal atrial fibrillation) ([place])   . Dysphasia, post-stroke   . COPD (chronic obstructive pulmonary disease) ([place]) 10/29/2017   . Chronic diastolic CHF (congestive heart failure) ([place]) 10/29/2017  . Hypothyroidism 10/29/2017  . Essential hypertension 10/29/2017  . GERD (gastroesophageal reflux disease) 10/29/2017  . Anxiety 10/29/2017  . 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 Hyperlipidemia 04/20/2013    Carey Bullocks, OTR/L 10/27/2018, 12:37 PM  [facility] [address] [place], [place], [address] Phone: [phone]   Fax:  [phone]  Name: Holly Flores MRN: [id number] Date of Birth: [birth year]

## 2018-11-01 ENCOUNTER — Ambulatory Visit: Payer: Medicare Other | Admitting: Occupational Therapy

## 2018-11-01 ENCOUNTER — Other Ambulatory Visit: Payer: Self-pay

## 2018-11-01 ENCOUNTER — Ambulatory Visit: Payer: Medicare Other | Admitting: Physical Therapy

## 2018-11-01 ENCOUNTER — Encounter: Payer: Self-pay | Admitting: Physical Therapy

## 2018-11-01 VITALS — BP 160/98

## 2018-11-01 DIAGNOSIS — R293 Abnormal posture: Secondary | ICD-10-CM

## 2018-11-01 DIAGNOSIS — R262 Difficulty in walking, not elsewhere classified: Secondary | ICD-10-CM

## 2018-11-01 DIAGNOSIS — M6281 Muscle weakness (generalized): Secondary | ICD-10-CM

## 2018-11-01 DIAGNOSIS — I69351 Hemiplegia and hemiparesis following cerebral infarction affecting right dominant side: Secondary | ICD-10-CM | POA: Diagnosis not present

## 2018-11-01 DIAGNOSIS — R2689 Other abnormalities of gait and mobility: Secondary | ICD-10-CM

## 2018-11-01 DIAGNOSIS — M21371 Foot drop, right foot: Secondary | ICD-10-CM

## 2018-11-01 NOTE — Therapy (Signed)
[facility] [address] [place], [place], [address] Phone: [phone]   Fax:  [phone]  Occupational Therapy Treatment  Patient Details  Name: Holly Flores MRN: [id number] Date of Birth: [birth year] Referring Provider (OT): Dr. Octavio Graves   Encounter Date: 11/01/2018  OT End of Session - 11/01/18 1156    Visit Number  6    Number of Visits  16    Date for OT Re-Evaluation  11/23/18    Authorization Type  M[employer] primary, [employer] secondary    Authorization - Visit Number  6    Authorization - Number of Visits  10    OT Start Time  1105    OT Stop Time  1145    OT Time Calculation (min)  40 min    Activity Tolerance  Patient tolerated treatment well       Past Medical History:  Diagnosis Date  . Anxiety   . Arthritis    "all over" (12/20/2017)  . Central retinal vein occlusion of left eye 01/22/2015  . CHF (congestive heart failure) ([place])   . Chronic bronchitis ([place])    "get it most years" (12/20/2017)  . Chronic lower back pain   . CKD (chronic kidney disease), stage II   . COPD (chronic obstructive pulmonary disease) ([place])   . CVA (cerebral vascular accident) ([place]) 12/30/2014   OCULAR  LEFT  EYE   . Depression   . Fibromyalgia   . GERD (gastroesophageal reflux disease)   . Goiter   . History of colonic polyps 05/14/2014  . Hyperlipidemia   . Hypertension   . Hypothyroidism   . Kidney cysts    left side  . 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 Migraine    "couple times/wk; been having them for a long time" (12/20/2017)  . Myocardial infarction [facility])    "one dr said I did; one said I didn't;  don't remember when it was" (12/20/2017)  . 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 On home oxygen therapy    "3L; sleep w/it q night" (12/20/2017)  . Pneumonia    "several times" (12/20/2017)  . Shortness of breath   . Sleep apnea    "stopped mask when I got on the  oxygen" (12/20/2017)  . Stroke [facility])     Past Surgical History:  Procedure Laterality Date  . ANTERIOR CERVICAL DECOMP/DISCECTOMY FUSION    . BACK SURGERY    . BREAST BIOPSY     left-non cancerous  . CARDIAC CATHETERIZATION    . CARPAL TUNNEL RELEASE Left   . CATARACT EXTRACTION W/PHACO  09/21/2011   Procedure: CATARACT EXTRACTION PHACO AND INTRAOCULAR LENS PLACEMENT (IOC);  Surgeon: Williams Che, MD;  Location: [facility];  Service: Ophthalmology;  Laterality: Left;  CDE:9.78  . CATARACT EXTRACTION W/PHACO Right 10/15/2014   Procedure: CATARACT EXTRACTION PHACO AND INTRAOCULAR LENS PLACEMENT ([place]);  Surgeon: Williams Che, MD;  Location: [facility];  Service: Ophthalmology;  Laterality: Right;  CDE:4.19  . [place]; 1972  . CHOLECYSTECTOMY OPEN    . COLONOSCOPY  2006   RMR: 1. Internal hemorroids, otherwise normal rectum. 2. Pedunculated polyp at 35 cm. reomved with snare. The remainder of teh colonic mucosa appeared normal.   . COLONOSCOPY  2011   Dr. Gala Romney: multiple ascending colon polyps and rectal polyp, adenomatous  . COLONOSCOPY N/A 05/31/2014   Procedure: COLONOSCOPY;  Surgeon: Daneil Dolin, MD;  [facility];  Service: Endoscopy;  Laterality: N/A;  130  . COLONOSCOPY WITH PROPOFOL N/A 06/24/2017   Procedure: COLONOSCOPY WITH PROPOFOL;  Surgeon: Daneil Dolin, MD;  Location[facility];  Service: Endoscopy;  Laterality: N/A;  2:15pm  . ESOPHAGOGASTRODUODENOSCOPY  2006   Dr. Gala Romney: Subtle Schatzkis ring, otherwise normal upper GI tract, aside from a small pyloric channell erosion, status post dilation as described above.   Marland Kitchen FLEXIBLE SIGMOIDOSCOPY N/A 11/11/2017   Procedure: FLEXIBLE SIGMOIDOSCOPY;  Surgeon: Otis Brace, MD;  Location: [facility];  Service: Gastroenterology;  Laterality: N/A;  . JOINT REPLACEMENT    . LUMBAR DISC SURGERY  X 2  . POLYPECTOMY  06/24/2017   Procedure: POLYPECTOMY;  Surgeon: Daneil Dolin, MD;  Location: [facility];  Service: Endoscopy;;  ascending  . POSTERIOR LUMBAR FUSION  ? date 1st fusion ; 07/2006   previous L4-5 Ray threaded fusion cage; Exploration of L4-5 fusion & PLIF 07/22/2006/notes 07/22/2006  . TONSILLECTOMY    . TOTAL KNEE ARTHROPLASTY Right 05/24/2013   Procedure: TOTAL KNEE ARTHROPLASTY;  Surgeon: [name], MD;  Location: [facility];  Service: Orthopedics;  Laterality: Right;  . TOTAL KNEE ARTHROPLASTY Left   . VAGINAL HYSTERECTOMY      There were no vitals filed for this visit.  Subjective Assessment - 11/01/18 1151    Subjective   BP elevated today, and pt reported not taking BP meds this am because she didn't have time to eat.    Pertinent History  CVA 10/2017. PMH: CHF, COPD, CKD (Stage II), fibromyalgia, depression. (Husband, who is primary caregiver, also has cancer)    Limitations  montior BP, obesity, needs DME to transfer (sliding board or stand assist)    Special Tests  BP = 170/98    Currently in Pain?  Yes    Pain Score  3     Pain Location  Head    Pain Descriptors / Indicators  Headache    Pain Type  Acute pain    Pain Onset  Today    Pain Frequency  Constant        BP = 170/98 today as pt did not take BP meds this am secondary to reports of not having time to eat breakfast. Reviewed s/s of CVA w/ pt/granddaughter since pt had mild headache. Granddaughter reports she will take her home and make sure she takes BP meds when they get home and continue to monitor patient. Family knows to call 911 if pt shows symptoms of CVA.   Performed toileting using Steady Assist w/ cues for pt/family on how to position and set up Steady correctly, then wheeled pt into bathroom - pt able to stand upright while therapist able to perform clothes management, then pt lowered slowly onto commode. Pt able to pull herself back to standing w/ min assist and maintain standing I'ly while family performed clothes management.   Pt moved back to mat for last 5 minutes to work on trunk  control/neuro re-educ w/ wt shifts and light wt bearing while seated.                      OT Short Term Goals - 09/29/18 1423      OT SHORT TERM GOAL #1   Title  Independent with HEP for RUE (Caregiver assisted and self ROM as able)    Time  4    Period  Weeks    Status  On-going  OT SHORT TERM GOAL #2   Title  Pt/family to verbalize understanding with A/E (rocker knife, adapted shoes, extended brush, LH sponge, etc) to increase independence with BADLS    Time  4    Period  Weeks    Status  On-going      OT SHORT TERM GOAL #3   Title  Pt/family to verbalize understanding with use of stand assist (Steady) with toileting and transfers to decrease burden of care on caregiver    Time  4    Period  Weeks    Status  On-going      OT SHORT TERM GOAL #4   Title  Pt to demo sliding board transfers consistently w/ mod assist or less for level transfer from w/c to/from bed    Time  4    Period  Weeks    Status  Achieved        OT Long Term Goals - 08/23/18 1618      OT LONG TERM GOAL #1   Title  Pt to demo independence with HEP for Lt shoulder (once pt seen by ortho MD)    Time  8    Period  Weeks    Status  New      OT LONG TERM GOAL #2   Title  Pt to be min assist for UE dressing and bathing    Time  8    Period  Weeks    Status  New      OT LONG TERM GOAL #3   Title  Pt to cut food and brush hair w/ A/E independently    Time  8    Period  Weeks    Status  New      OT LONG TERM GOAL #4   Title  Pt to perform toilet transfer w/ mod assist using correct DME prn    Time  8    Period  Weeks    Status  New      OT LONG TERM GOAL #5   Title  Pt to perform LE dressing at max assist, LE bathing at min assist w/ use of LH sponge    Time  8    Period  Weeks    Status  New            Plan - 11/01/18 1156    Clinical Impression Statement  Pt progressing slowly towards goals and toileting w/ Steady Assist    Occupational performance deficits  (Please refer to evaluation for details):  ADL's;IADL's;Rest and Sleep    Body Structure / Function / Physical Skills  ADL;ROM;Vision;IADL;Edema;Body mechanics;Endurance;Mobility;Strength;Coordination;Obesity;UE functional use;Pain;Decreased knowledge of use of DME    Rehab Potential  Fair    Comorbidities impacting occupational performance description:  obesity, CHF, COPD, Lt shoulder limitations    OT Frequency  2x / week    OT Duration  8 weeks    OT Treatment/Interventions  Self-care/ADL training;Therapeutic exercise;Functional Mobility Training;Neuromuscular education;Splinting;Manual Therapy;Therapeutic activities;DME and/or AE instruction;Aquatic Therapy;Passive range of motion;Patient/family education;Visual/perceptual remediation/compensation    Plan  monitor BP, continue trunk control, and ADLS    Consulted and Agree with Plan of Care  Patient;Family member/caregiver    Family Member Consulted  granddaughter       Patient will benefit from skilled therapeutic intervention in order to improve the following deficits and impairments:   Body Structure / Function / Physical Skills: ADL, ROM, Vision, IADL, Edema, Body mechanics, Endurance, Mobility, Strength, Coordination, Obesity, UE functional use,  Pain, Decreased knowledge of use of DME       Visit Diagnosis: 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 Acute CVA (cerebrovascular accident) ([place]) 10/01/2018  . Physical deconditioning 12/19/2017  . Chronic kidney disease (CKD), stage III (moderate) (HCC)   . Hypoalbuminemia due to protein-calorie malnutrition ([place])   . Intractable migraine without status migrainosus   . Reactive depression   . Hemiparesis affecting right side as late effect of stroke ([place])   . Slow transit constipation   . PAF (paroxysmal atrial fibrillation) ([place])   . Dysphasia,  post-stroke   . COPD (chronic obstructive pulmonary disease) ([place]) 10/29/2017  . Chronic diastolic CHF (congestive heart failure) ([place]) 10/29/2017  . Hypothyroidism 10/29/2017  . Essential hypertension 10/29/2017  . GERD (gastroesophageal reflux disease) 10/29/2017  . Anxiety 10/29/2017  . 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 Hyperlipidemia 04/20/2013    Carey Bullocks, OTR/L 11/01/2018, 11:58 AM  [facility] [address] [place], [place], [address] Phone: [phone]   Fax:  [phone]  Name: AURA COVINO MRN: [id number] Date of Birth: 11/15/1945

## 2018-11-01 NOTE — Therapy (Signed)
[facility] [address] [place], [place], [address] Phone: [phone]   Fax:  [phone]  Physical Therapy Treatment  Patient Details  Name: Holly Flores MRN: [id number] Date of Birth: [birth year] Referring Provider (PT): Octavio Graves, DO   Encounter Date: 11/01/2018  PT End of Session - 11/01/18 1400    Visit Number  24    Number of Visits  37    Date for PT Re-Evaluation  12/19/18    Authorization Type  Progress note every 10th visit; KX modifier at 15th visit.    PT Start Time  1150    PT Stop Time  1225    PT Time Calculation (min)  35 min    Equipment Utilized During Treatment  Other (comment)   AFO, RW with R hand orthosis   Activity Tolerance  Patient tolerated treatment well;Other (comment)   elevated BP, not taking BP medications   Behavior During Therapy  WFL for tasks assessed/performed       Past Medical History:  Diagnosis Date  . Anxiety   . Arthritis    "all over" (12/20/2017)  . Central retinal vein occlusion of left eye 01/22/2015  . CHF (congestive heart failure) ([place])   . Chronic bronchitis ([place])    "get it most years" (12/20/2017)  . Chronic lower back pain   . CKD (chronic kidney disease), stage II   . COPD (chronic obstructive pulmonary disease) ([place])   . CVA (cerebral vascular accident) ([place]) 12/30/2014   OCULAR  LEFT  EYE   . Depression   . Fibromyalgia   . GERD (gastroesophageal reflux disease)   . Goiter   . History of colonic polyps 05/14/2014  . Hyperlipidemia   . Hypertension   . Hypothyroidism   . Kidney cysts    left side  . 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 Migraine    "couple times/wk; been having them for a long time" (12/20/2017)  . Myocardial infarction [facility])    "one dr said I did; one said I didn't;  don't remember when it was" (12/20/2017)  . 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 On home oxygen therapy    "3L;  sleep w/it q night" (12/20/2017)  . Pneumonia    "several times" (12/20/2017)  . Shortness of breath   . Sleep apnea    "stopped mask when I got on the oxygen" (12/20/2017)  . Stroke [facility])     Past Surgical History:  Procedure Laterality Date  . ANTERIOR CERVICAL DECOMP/DISCECTOMY FUSION    . BACK SURGERY    . BREAST BIOPSY     left-non cancerous  . CARDIAC CATHETERIZATION    . CARPAL TUNNEL RELEASE Left   . CATARACT EXTRACTION W/PHACO  09/21/2011   Procedure: CATARACT EXTRACTION PHACO AND INTRAOCULAR LENS PLACEMENT (IOC);  Surgeon: Williams Che, MD;  Location: [facility];  Service: Ophthalmology;  Laterality: Left;  CDE:9.78  . CATARACT EXTRACTION W/PHACO Right 10/15/2014   Procedure: CATARACT EXTRACTION PHACO AND INTRAOCULAR LENS PLACEMENT ([place]);  Surgeon: Williams Che, MD;  Location: [facility];  Service: Ophthalmology;  Laterality: Right;  CDE:4.19  . [place]; 1972  . CHOLECYSTECTOMY OPEN    . COLONOSCOPY  2006   RMR: 1. Internal hemorroids, otherwise normal rectum. 2. Pedunculated polyp at 35 cm. reomved with snare. The remainder of teh colonic mucosa appeared normal.   . COLONOSCOPY  2011   Dr. Gala Romney: multiple  ascending colon polyps and rectal polyp, adenomatous  . COLONOSCOPY N/A 05/31/2014   Procedure: COLONOSCOPY;  Surgeon: Daneil Dolin, MD;  [facility];  Service: Endoscopy;  Laterality: N/A;  130  . COLONOSCOPY WITH PROPOFOL N/A 06/24/2017   Procedure: COLONOSCOPY WITH PROPOFOL;  Surgeon: Daneil Dolin, MD;  [facility];  Service: Endoscopy;  Laterality: N/A;  2:15pm  . ESOPHAGOGASTRODUODENOSCOPY  2006   Dr. Gala Romney: Subtle Schatzkis ring, otherwise normal upper GI tract, aside from a small pyloric channell erosion, status post dilation as described above.   Marland Kitchen FLEXIBLE SIGMOIDOSCOPY N/A 11/11/2017   Procedure: FLEXIBLE SIGMOIDOSCOPY;  Surgeon: Otis Brace, MD;  Location: [facility];  Service: Gastroenterology;  Laterality: N/A;  .  JOINT REPLACEMENT    . LUMBAR DISC SURGERY  X 2  . POLYPECTOMY  06/24/2017   Procedure: POLYPECTOMY;  Surgeon: Daneil Dolin, MD;  [facility];  Service: Endoscopy;;  ascending  . POSTERIOR LUMBAR FUSION  ? date 1st fusion ; 07/2006   previous L4-5 Ray threaded fusion cage; Exploration of L4-5 fusion & PLIF 07/22/2006/notes 07/22/2006  . TONSILLECTOMY    . TOTAL KNEE ARTHROPLASTY Right 05/24/2013   Procedure: TOTAL KNEE ARTHROPLASTY;  Surgeon: [name], MD;  Location: [facility];  Service: Orthopedics;  Laterality: Right;  . TOTAL KNEE ARTHROPLASTY Left   . VAGINAL HYSTERECTOMY      Vitals:   11/01/18 1158 11/01/18 1214 11/01/18 1222  BP: (!) 155/96 (!) 158/96 (!) 160/98    Subjective Assessment - 11/01/18 1152    Subjective  Ordered the larger pair of shoes and they should be delivered soon. Did not take her BP medication and did not eat breakfast today. States she has a little bit of a headache (pt states that she normally has this headache and doesn't feel different than it normally feels).    Patient is accompained by:  Family member   grand daughter   Pertinent History  left sided CVA 10/29/17, Chronic CHF, AFib, COPD, Chronic Kidney Disease stage III, hx of bil TKA, Lumbar surgery    Patient Stated Goals  move around better in the home and improve function    Pain Onset  More than a month ago            11/01/18 0001  Transfers  Transfers Sit to Stand;Stand to Sit  Sit to Stand 4: Min assist  Sit to Stand Details (indicate cue type and reason) Therapist supporting RUE, positioning RLE before standing - pt RLE with tendency to be positioned in ER. Pt able to grab farther bar using LUE and help pull herself to stand.   Transfer via Advertising account executive  Other Self-Care Comments  Pt arrived with elevated BP - WNL to participate in PT, pt participated in OT session before PT. Did not take her BP medication this morning and did not eat breakfast. Pt reporting  a mild headache at start of session - pt stating that she normally has this headache everyday. Pt asymptomatic otherwise. Vitals monitored throughout session for seated exercises (see Vitals flowsheet). After mirror therapy, pt continued to report a mild headache. BP checked at 160/98, otherwise asymptomatic. Pt thinking this may be because she didn't eat. Ended session early - went over the warning signs of a stroke with pt and pt's granddaughter, both verbalized understanding. Discussed having pt to take her BP immediately when home and eating lunch and to monitor BP at home. Instructed grand  daughter to take pt to ED if BP does not decrease and pt demonstrates symptoms of CVA, pt also verbalizes understanding.   Neuro Re-ed   Neuro Re-ed Details  Seated at edge of mat with wedge under RLE to promote weight shifting to LLE - placed mirror to face LLE so that pt could see "right leg" moving in the mirror.  Performed 15 reps of mirror therapy with pt focusing on visualizing "right leg" performing these movements: heel lift, toe lifts, LAQs, hamstring curls, hip ABD<>ADD, hip flexion.  With an additional 15 reps of heel lifts and toe lifts. Also performed sensory discrimination with RLE (continued to be blocked by the mirror and pt looking in mirror to see "right leg") - touching various dermatomes of RLE with a yard stick and mimicking the same on LLE. Asking pt where she felt therapist touching - so pt could visualize in the mirror what she felt on RLE.                        PT Education - 11/01/18 1450    Education Details  see self-care    Person(s) Educated  Patient   pt's granddaughter   Methods  Explanation    Comprehension  Verbalized understanding       PT Short Term Goals - 10/20/18 1726      PT SHORT TERM GOAL #1   Title  Pt and family will be independent with supine, seated and standing HEP    Time  4    Status  On-going    Target Date  11/19/18      PT SHORT  TERM GOAL #2   Title  Family will demonstrate safe use of Stedy for transfers at home    Time  4    Period  Weeks    Status  New    Target Date  11/19/18      PT SHORT TERM GOAL #3   Title  Pt will ambulate x 25' with RW with hand orthosis, trial AFO and mod-max A of one therapist    Time  4    Period  Weeks    Status  New    Target Date  11/19/18        PT Long Term Goals - 10/20/18 1725      PT LONG TERM GOAL #1   Title  Patient and family will be independent with final HEP recommendations    Time  8    Period  Weeks    Status  On-going    Target Date  12/19/18      PT LONG TERM GOAL #2   Title  Patient will perform sit <> stand from wheelchair with min A and transfer stand pivot with RW and AFO with Mod A of one person    Baseline  Min A to stand, not transferring with RW yet    Time  8    Period  Weeks    Status  Partially Met    Target Date  12/19/18      PT LONG TERM GOAL #3   Title  Pt will perform bed mobility on flat mat with min A    Time  8    Period  Weeks    Status  On-going    Target Date  12/19/18      PT LONG TERM GOAL #4   Title  Family will demonstrate independent ability to don appropriate  AFO with R shoe    Time  8    Period  Weeks    Status  On-going    Target Date  12/19/18      PT LONG TERM GOAL #5   Title  Patient will ambulate 40 feet or greater with min A, AFO, and LRAD to safely navigate around home.    Time  8    Period  Weeks    Status  On-going    Target Date  12/19/18            Plan - 11/01/18 1501    Clinical Impression Statement  Pt with elevated BP at start of session today (did not take her medication this morning), was also elevated with OT session prior to PT - however, WFL to participate in PT. At beginning of session pt reported a mild headache (per pt this is a headache that she has daily and it doesn't feel different). Progressed with seated mirror therapy exercises. At end of session, pt reported the same  headache. Reviewed the warning signs and sx of a CVA with pt and caregiver and discussed the importance of taking BP medication daily at the designated time. Vitals checked throughout session (see flowsheet). Will continue to progress towards LTGs when pt has appropriately taken her BP medication.    Personal Factors and Comorbidities  Age;Comorbidity 3+;Time since onset of injury/illness/exacerbation    Examination-Activity Limitations  Lift;Dressing;Self Feeding;Transfers;Toileting;Stand;Locomotion Level;Reach Overhead;Bed Mobility    Rehab Potential  Fair    PT Frequency  2x / week    PT Duration  8 weeks    PT Treatment/Interventions  ADLs/Self Care Home Management;Electrical Stimulation;Gait training;Stair training;Functional mobility training;Balance training;Therapeutic exercise;Therapeutic activities;Neuromuscular re-education;Wheelchair mobility training;Manual techniques;Passive range of motion;Patient/family education;DME Instruction;Energy conservation;Orthotic Fit/Training    PT Next Visit Plan  MONITOR BP EVERY SESSION WITH MANUAL CUFF ON LUE; Training with new AFO - donning and skin checks; start wearing 2 hours/day. Sit<>stand from mat with RW, stand pivot transfers with RW, gait with RW. Nustep with R hand orthosis. Seated mirror therapy for LE. Stedy transfers and sit <> stand on Stedy without UE support. Seated on large rockerboard or inverted BOSU performing lateral tilts or anterior/posterior for sit <> stand. Work on bed mobility on flat bed. Add to HEP: WB exercises for mm activation    Consulted and Agree with Plan of Care  Patient;Family member/caregiver    Family Member Consulted  daughter       Patient will benefit from skilled therapeutic intervention in order to improve the following deficits and impairments:  Pain, Postural dysfunction, Impaired tone, Decreased mobility, Decreased activity tolerance, Decreased range of motion, Decreased strength, Impaired UE functional use,  Difficulty walking, Decreased balance, Abnormal gait, Decreased endurance  Visit Diagnosis: Abnormal posture  Muscle weakness (generalized)  Difficulty in walking, not elsewhere classified  Foot drop, right  Other abnormalities of gait and mobility     Problem List Patient Active Problem List   Diagnosis Date Noted  . Acute CVA (cerebrovascular accident) ([place]) 10/01/2018  . Physical deconditioning 12/19/2017  . Chronic kidney disease (CKD), stage III (moderate) (HCC)   . Hypoalbuminemia due to protein-calorie malnutrition ([place])   . Intractable migraine without status migrainosus   . Reactive depression   . Hemiparesis affecting right side as late effect of stroke ([place])   . Slow transit constipation   . PAF (paroxysmal atrial fibrillation) ([place])   . Dysphasia, post-stroke   . COPD (chronic obstructive pulmonary disease) ([place])  10/29/2017  . Chronic diastolic CHF (congestive heart failure) ([place]) 10/29/2017  . Hypothyroidism 10/29/2017  . Essential hypertension 10/29/2017  . GERD (gastroesophageal reflux disease) 10/29/2017  . Anxiety 10/29/2017  . 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 Hyperlipidemia 04/20/2013    Arliss Journey, PT, DPT 11/01/2018, 3:05 PM  [facility] [address] [place], [place], [address] Phone: [phone]   Fax:  [phone]  Name: ELODY KLEINSASSER MRN: [id number] Date of Birth: 05-02-1945

## 2018-11-03 ENCOUNTER — Ambulatory Visit: Payer: Medicare Other | Admitting: Physical Therapy

## 2018-11-03 ENCOUNTER — Ambulatory Visit: Payer: Medicare Other | Admitting: Occupational Therapy

## 2018-11-08 ENCOUNTER — Encounter: Payer: Self-pay | Admitting: Occupational Therapy

## 2018-11-08 ENCOUNTER — Other Ambulatory Visit: Payer: Self-pay

## 2018-11-08 ENCOUNTER — Ambulatory Visit: Payer: Medicare Other | Admitting: Occupational Therapy

## 2018-11-08 ENCOUNTER — Ambulatory Visit: Payer: Medicare Other | Admitting: Physical Therapy

## 2018-11-08 VITALS — BP 137/85 | HR 54

## 2018-11-08 VITALS — BP 130/86 | HR 54

## 2018-11-08 DIAGNOSIS — R29818 Other symptoms and signs involving the nervous system: Secondary | ICD-10-CM

## 2018-11-08 DIAGNOSIS — M25612 Stiffness of left shoulder, not elsewhere classified: Secondary | ICD-10-CM

## 2018-11-08 DIAGNOSIS — I69351 Hemiplegia and hemiparesis following cerebral infarction affecting right dominant side: Secondary | ICD-10-CM

## 2018-11-08 DIAGNOSIS — M21371 Foot drop, right foot: Secondary | ICD-10-CM

## 2018-11-08 DIAGNOSIS — R2689 Other abnormalities of gait and mobility: Secondary | ICD-10-CM

## 2018-11-08 DIAGNOSIS — R293 Abnormal posture: Secondary | ICD-10-CM

## 2018-11-08 DIAGNOSIS — R262 Difficulty in walking, not elsewhere classified: Secondary | ICD-10-CM

## 2018-11-08 DIAGNOSIS — M6281 Muscle weakness (generalized): Secondary | ICD-10-CM

## 2018-11-08 NOTE — Therapy (Signed)
[facility] [address] [place], [place], [address] Phone: [phone]   Fax:  [phone]  Physical Therapy Treatment  Patient Details  Name: Holly Flores MRN: [id number] Date of Birth: 15-May-1945 Referring Provider (PT): Octavio Graves, DO   Encounter Date: 11/08/2018  PT End of Session - 11/08/18 1427    Visit Number  25    Number of Visits  37    Date for PT Re-Evaluation  12/19/18    Authorization Type  Progress note every 10th visit; KX modifier at 15th visit.    PT Start Time  1148    PT Stop Time  1230    PT Time Calculation (min)  42 min    Equipment Utilized During Treatment  Other (comment)    Activity Tolerance  Patient tolerated treatment well;Other (comment)   feeling hot during standing with Stedy - vitals WNL   Behavior During Therapy  WFL for tasks assessed/performed       Past Medical History:  Diagnosis Date  . Anxiety   . Arthritis    "all over" (12/20/2017)  . Central retinal vein occlusion of left eye 01/22/2015  . CHF (congestive heart failure) ([place])   . Chronic bronchitis ([place])    "get it most years" (12/20/2017)  . Chronic lower back pain   . CKD (chronic kidney disease), stage II   . COPD (chronic obstructive pulmonary disease) ([place])   . CVA (cerebral vascular accident) ([place]) 12/30/2014   OCULAR  LEFT  EYE   . Depression   . Fibromyalgia   . GERD (gastroesophageal reflux disease)   . Goiter   . History of colonic polyps 05/14/2014  . Hyperlipidemia   . Hypertension   . Hypothyroidism   . Kidney cysts    left side  . 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 Migraine    "couple times/wk; been having them for a long time" (12/20/2017)  . Myocardial infarction [facility])    "one dr said I did; one said I didn't;  don't remember when it was" (12/20/2017)  . 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 On home oxygen therapy    "3L; sleep w/it q night"  (12/20/2017)  . Pneumonia    "several times" (12/20/2017)  . Shortness of breath   . Sleep apnea    "stopped mask when I got on the oxygen" (12/20/2017)  . Stroke [facility])     Past Surgical History:  Procedure Laterality Date  . ANTERIOR CERVICAL DECOMP/DISCECTOMY FUSION    . BACK SURGERY    . BREAST BIOPSY     left-non cancerous  . CARDIAC CATHETERIZATION    . CARPAL TUNNEL RELEASE Left   . CATARACT EXTRACTION W/PHACO  09/21/2011   Procedure: CATARACT EXTRACTION PHACO AND INTRAOCULAR LENS PLACEMENT (IOC);  Surgeon: Williams Che, MD;  Location: [facility];  Service: Ophthalmology;  Laterality: Left;  CDE:9.78  . CATARACT EXTRACTION W/PHACO Right 10/15/2014   Procedure: CATARACT EXTRACTION PHACO AND INTRAOCULAR LENS PLACEMENT ([place]);  Surgeon: Williams Che, MD;  Location: [facility];  Service: Ophthalmology;  Laterality: Right;  CDE:4.19  . [place]; 1972  . CHOLECYSTECTOMY OPEN    . COLONOSCOPY  2006   RMR: 1. Internal hemorroids, otherwise normal rectum. 2. Pedunculated polyp at 35 cm. reomved with snare. The remainder of teh colonic mucosa appeared normal.   . COLONOSCOPY  2011   Dr. Gala Romney: multiple ascending colon polyps and  rectal polyp, adenomatous  . COLONOSCOPY N/A 05/31/2014   Procedure: COLONOSCOPY;  Surgeon: Daneil Dolin, MD;  Location[facility];  Service: Endoscopy;  Laterality: N/A;  130  . COLONOSCOPY WITH PROPOFOL N/A 06/24/2017   Procedure: COLONOSCOPY WITH PROPOFOL;  Surgeon: Daneil Dolin, MD;  Location[facility];  Service: Endoscopy;  Laterality: N/A;  2:15pm  . ESOPHAGOGASTRODUODENOSCOPY  2006   Dr. Gala Romney: Subtle Schatzkis ring, otherwise normal upper GI tract, aside from a small pyloric channell erosion, status post dilation as described above.   Marland Kitchen FLEXIBLE SIGMOIDOSCOPY N/A 11/11/2017   Procedure: FLEXIBLE SIGMOIDOSCOPY;  Surgeon: Otis Brace, MD;  Location: [facility];  Service: Gastroenterology;  Laterality: N/A;  . JOINT REPLACEMENT     . LUMBAR DISC SURGERY  X 2  . POLYPECTOMY  06/24/2017   Procedure: POLYPECTOMY;  Surgeon: Daneil Dolin, MD;  [facility];  Service: Endoscopy;;  ascending  . POSTERIOR LUMBAR FUSION  ? date 1st fusion ; 07/2006   previous L4-5 Ray threaded fusion cage; Exploration of L4-5 fusion & PLIF 07/22/2006/notes 07/22/2006  . TONSILLECTOMY    . TOTAL KNEE ARTHROPLASTY Right 05/24/2013   Procedure: TOTAL KNEE ARTHROPLASTY;  Surgeon: [name], MD;  Location: [facility];  Service: Orthopedics;  Laterality: Right;  . TOTAL KNEE ARTHROPLASTY Left   . VAGINAL HYSTERECTOMY      Vitals:   11/08/18 1150 11/08/18 1215  BP: 137/85 130/86  Pulse:  (!) 54    Subjective Assessment - 11/08/18 1151    Subjective  The larger pair of shoes have not been delivered yet. Took her BP medication this morning.    Patient is accompained by:  Family member   grand daughter   Pertinent History  left sided CVA 10/29/17, Chronic CHF, AFib, COPD, Chronic Kidney Disease stage III, hx of bil TKA, Lumbar surgery    Patient Stated Goals  move around better in the home and improve function    Currently in Pain?  No/denies    Pain Onset  More than a month ago                       [facility] Adult PT Treatment/Exercise - 11/08/18 1430      Transfers   Transfers  Sit to Stand;Stand to Sit    Sit to Stand  4: Min assist;With upper extremity assist    Sit to Stand Details  Manual facilitation for weight shifting;Manual facilitation for placement;Manual facilitation for weight bearing    Sit to Stand Details (indicate cue type and reason)  Therapist supporting RUE - put using LUE to help pull to stand - with mirror in front of pt for visual cueing for midline orientation and manual facilitation for weight shifting to R and to stand tall through RLE. Massed practice sit <> stands with intermittent breaks between each couple of reps. Pt only able to tolerate standing for approx. 15 seconds at a time before  needing to sit back on Stedy and rest.     Transfer via Multimedia programmer      Posture/Postural Control   Posture Comments  In short sitting at edge of mat with mirror for visual cueing: midline alightment and weight shifting towards R, multiple reps      Self-Care   Other Self-Care Comments   Attempted putting on pt's AFO with current shoes - unable to don R shoe with AFO (even had assistance of another therapist and  they were not able to put it on) - unable to try standing with RW today without AFO on. asked about when new shoes would come in, pt's family member said hopefully by next visit                PT Short Term Goals - 10/20/18 1726      PT SHORT TERM GOAL #1   Title  Pt and family will be independent with supine, seated and standing HEP    Time  4    Status  On-going    Target Date  11/19/18      PT SHORT TERM GOAL #2   Title  Family will demonstrate safe use of Stedy for transfers at home    Time  4    Period  Weeks    Status  New    Target Date  11/19/18      PT SHORT TERM GOAL #3   Title  Pt will ambulate x 25' with RW with hand orthosis, trial AFO and mod-max A of one therapist    Time  4    Period  Weeks    Status  New    Target Date  11/19/18        PT Long Term Goals - 10/20/18 1725      PT LONG TERM GOAL #1   Title  Patient and family will be independent with final HEP recommendations    Time  8    Period  Weeks    Status  On-going    Target Date  12/19/18      PT LONG TERM GOAL #2   Title  Patient will perform sit <> stand from wheelchair with min A and transfer stand pivot with RW and AFO with Mod A of one person    Baseline  Min A to stand, not transferring with RW yet    Time  8    Period  Weeks    Status  Partially Met    Target Date  12/19/18      PT LONG TERM GOAL #3   Title  Pt will perform bed mobility on flat mat with min A    Time  8    Period  Weeks    Status  On-going    Target Date  12/19/18      PT LONG TERM  GOAL #4   Title  Family will demonstrate independent ability to don appropriate AFO with R shoe    Time  8    Period  Weeks    Status  On-going    Target Date  12/19/18      PT LONG TERM GOAL #5   Title  Patient will ambulate 40 feet or greater with min A, AFO, and LRAD to safely navigate around home.    Time  8    Period  Weeks    Status  On-going    Target Date  12/19/18            Plan - 11/08/18 1425    Clinical Impression Statement  Pt's BP WNL to participate in therapy today. Focus of today's session was sit <> stand from [place] with mirror for visual cueing for midline and weight shifting to R. After multiple reps, pt stated that she felt hot and needed to sit down - took BP and vitals WNL, pt's HR bradycardic (pt states that she was put on a medication for this from the cardiologist). Pt  reported feeling better after prolonged sitting rest break. Unable to try standing with RW today due to pt not having correct shoe size for AFO. Will continue to progress towards LTGs.    Personal Factors and Comorbidities  Age;Comorbidity 3+;Time since onset of injury/illness/exacerbation    Examination-Activity Limitations  Lift;Dressing;Self Feeding;Transfers;Toileting;Stand;Locomotion Level;Reach Overhead;Bed Mobility    Rehab Potential  Fair    PT Frequency  2x / week    PT Duration  8 weeks    PT Treatment/Interventions  ADLs/Self Care Home Management;Electrical Stimulation;Gait training;Stair training;Functional mobility training;Balance training;Therapeutic exercise;Therapeutic activities;Neuromuscular re-education;Wheelchair mobility training;Manual techniques;Passive range of motion;Patient/family education;DME Instruction;Energy conservation;Orthotic Fit/Training    PT Next Visit Plan  MONITOR BP EVERY SESSION WITH MANUAL CUFF ON LUE; Needs more appointments? Training with new AFO - donning and skin checks; start wearing 2 hours/day. Sit<>stand from mat with RW, stand pivot transfers  with RW, gait with RW. Nustep with R hand orthosis. Seated mirror therapy for LE. Stedy transfers and sit <> stand on Stedy without UE support. Seated on large rockerboard or inverted BOSU performing lateral tilts or anterior/posterior for sit <> stand. Work on bed mobility on flat bed. 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 Acute CVA (cerebrovascular accident) ([place]) 10/01/2018  . Physical deconditioning 12/19/2017  . Chronic kidney disease (CKD), stage III (moderate) (HCC)   . Hypoalbuminemia due to protein-calorie malnutrition ([place])   . Intractable migraine without status migrainosus   . Reactive depression   . Hemiparesis affecting right side as late effect of stroke ([place])   . Slow transit constipation   . PAF (paroxysmal atrial fibrillation) ([place])   . Dysphasia, post-stroke   . COPD (chronic obstructive pulmonary disease) ([place]) 10/29/2017  . Chronic diastolic CHF (congestive heart failure) ([place]) 10/29/2017  . Hypothyroidism 10/29/2017  . Essential hypertension 10/29/2017  . GERD (gastroesophageal reflux disease)  10/29/2017  . Anxiety 10/29/2017  . 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 Hyperlipidemia 04/20/2013    Arliss Journey, PT, DPT 11/08/2018, 2:39 PM  [facility] [address] [place], [place], [address] Phone: [phone]   Fax:  [phone]  Name: Holly Flores MRN: [id number] Date of Birth: [birth year]

## 2018-11-08 NOTE — Therapy (Signed)
[facility] [address] [place], [place], [address] Phone: [phone]   Fax:  [phone]  Occupational Therapy Treatment  Patient Details  Name: Holly Flores MRN: [id number] Date of Birth: [birth year] Referring Provider (OT): Dr. Octavio Graves   Encounter Date: 11/08/2018  OT End of Session - 11/08/18 1225    Visit Number  7    Number of Visits  16    Date for OT Re-Evaluation  11/23/18    Authorization Type  M[employer] primary, [employer] secondary    Authorization - Visit Number  7    Authorization - Number of Visits  10    OT Start Time  1102    OT Stop Time  1145    OT Time Calculation (min)  43 min    Activity Tolerance  Patient tolerated treatment well    Behavior During Therapy  [facility] for tasks assessed/performed       Past Medical History:  Diagnosis Date  . Anxiety   . Arthritis    "all over" (12/20/2017)  . Central retinal vein occlusion of left eye 01/22/2015  . CHF (congestive heart failure) ([place])   . Chronic bronchitis ([place])    "get it most years" (12/20/2017)  . Chronic lower back pain   . CKD (chronic kidney disease), stage II   . COPD (chronic obstructive pulmonary disease) ([place])   . CVA (cerebral vascular accident) ([place]) 12/30/2014   OCULAR  LEFT  EYE   . Depression   . Fibromyalgia   . GERD (gastroesophageal reflux disease)   . Goiter   . History of colonic polyps 05/14/2014  . Hyperlipidemia   . Hypertension   . Hypothyroidism   . Kidney cysts    left side  . 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 Migraine    "couple times/wk; been having them for a long time" (12/20/2017)  . Myocardial infarction [facility])    "one dr said I did; one said I didn't;  don't remember when it was" (12/20/2017)  . 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 On home oxygen therapy    "3L; sleep w/it q night" (12/20/2017)  . Pneumonia    "several times" (12/20/2017)  . Shortness of  breath   . Sleep apnea    "stopped mask when I got on the oxygen" (12/20/2017)  . Stroke [facility])     Past Surgical History:  Procedure Laterality Date  . ANTERIOR CERVICAL DECOMP/DISCECTOMY FUSION    . BACK SURGERY    . BREAST BIOPSY     left-non cancerous  . CARDIAC CATHETERIZATION    . CARPAL TUNNEL RELEASE Left   . CATARACT EXTRACTION W/PHACO  09/21/2011   Procedure: CATARACT EXTRACTION PHACO AND INTRAOCULAR LENS PLACEMENT (IOC);  Surgeon: Williams Che, MD;  Location: [facility];  Service: Ophthalmology;  Laterality: Left;  CDE:9.78  . CATARACT EXTRACTION W/PHACO Right 10/15/2014   Procedure: CATARACT EXTRACTION PHACO AND INTRAOCULAR LENS PLACEMENT ([place]);  Surgeon: Williams Che, MD;  Location: [facility];  Service: Ophthalmology;  Laterality: Right;  CDE:4.19  . [place]; 1972  . CHOLECYSTECTOMY OPEN    . COLONOSCOPY  2006   RMR: 1. Internal hemorroids, otherwise normal rectum. 2. Pedunculated polyp at 35 cm. reomved with snare. The remainder of teh colonic mucosa appeared normal.   . COLONOSCOPY  2011   Dr. Gala Romney: multiple ascending colon polyps and rectal polyp, adenomatous  . COLONOSCOPY N/A 05/31/2014  Procedure: COLONOSCOPY;  Surgeon: Daneil Dolin, MD;  [facility];  Service: Endoscopy;  Laterality: N/A;  130  . COLONOSCOPY WITH PROPOFOL N/A 06/24/2017   Procedure: COLONOSCOPY WITH PROPOFOL;  Surgeon: Daneil Dolin, MD;  [facility];  Service: Endoscopy;  Laterality: N/A;  2:15pm  . ESOPHAGOGASTRODUODENOSCOPY  2006   Dr. Gala Romney: Subtle Schatzkis ring, otherwise normal upper GI tract, aside from a small pyloric channell erosion, status post dilation as described above.   Marland Kitchen FLEXIBLE SIGMOIDOSCOPY N/A 11/11/2017   Procedure: FLEXIBLE SIGMOIDOSCOPY;  Surgeon: Otis Brace, MD;  Location: [facility];  Service: Gastroenterology;  Laterality: N/A;  . JOINT REPLACEMENT    . LUMBAR DISC SURGERY  X 2  . POLYPECTOMY  06/24/2017   Procedure:  POLYPECTOMY;  Surgeon: Daneil Dolin, MD;  [facility];  Service: Endoscopy;;  ascending  . POSTERIOR LUMBAR FUSION  ? date 1st fusion ; 07/2006   previous L4-5 Ray threaded fusion cage; Exploration of L4-5 fusion & PLIF 07/22/2006/notes 07/22/2006  . TONSILLECTOMY    . TOTAL KNEE ARTHROPLASTY Right 05/24/2013   Procedure: TOTAL KNEE ARTHROPLASTY;  Surgeon: [name], MD;  Location: [facility];  Service: Orthopedics;  Laterality: Right;  . TOTAL KNEE ARTHROPLASTY Left   . VAGINAL HYSTERECTOMY      Vitals:   11/08/18 1111  BP: 137/85  Pulse: (!) 54    Subjective Assessment - 11/08/18 1107    Subjective   I feel good about standing.  She wants me tp see a neuro doctor because my left side is messing up now    Pertinent History  CVA 10/2017. PMH: CHF, COPD, CKD (Stage II), fibromyalgia, depression. (Husband, who is primary caregiver, also has cancer)    Limitations  montior BP, obesity, needs DME to transfer (sliding board or stand assist)                   OT Treatments/Exercises (OP) - 11/08/18 0001      ADLs   Toileting  Worked on level surrface transfers to  left and right without sliding board, in the event patient could place bedside commode next to bed.  Patient required similar level of assistance (min) with or without slide board.  Discussed logisitics of clothing management (remove pull up) before transfer to commode.      Neurological Re-education Exercises   Other Exercises 1  Worked on postural control as needed for bathing, dressing, toileting.  Patient with bias toward posterior left, and with attempts to lift off surface - tends to push toward weaker side.  With cueing and facilitation regarding head position, erect trunk, and forward trunk flexion, patient able to lift off and drive hips left or right for squat pivot transfer.  Worked on forward weight shift in sitting, patient expressing fear of falling, yet very willing to be guided through weight  shift forward.  Worked on isolating right arm movement from head and trunk movement for sholder flex/ext, ext rotation, on supported surface.               OT Education - 11/08/18 1224    Education Details  Need to sit tall and shift weight forward for safe transfers    Person(s) Educated  Patient    Methods  Explanation;Demonstration;Tactile cues    Comprehension  Need further instruction;Verbalized understanding       OT Short Term Goals - 09/29/18 1423      OT SHORT TERM GOAL #  1   Title  Independent with HEP for RUE (Caregiver assisted and self ROM as able)    Time  4    Period  Weeks    Status  On-going      OT SHORT TERM GOAL #2   Title  Pt/family to verbalize understanding with A/E (rocker knife, adapted shoes, extended brush, LH sponge, etc) to increase independence with BADLS    Time  4    Period  Weeks    Status  On-going      OT SHORT TERM GOAL #3   Title  Pt/family to verbalize understanding with use of stand assist (Steady) with toileting and transfers to decrease burden of care on caregiver    Time  4    Period  Weeks    Status  On-going      OT SHORT TERM GOAL #4   Title  Pt to demo sliding board transfers consistently w/ mod assist or less for level transfer from w/c to/from bed    Time  4    Period  Weeks    Status  Achieved        OT Long Term Goals - 08/23/18 1618      OT LONG TERM GOAL #1   Title  Pt to demo independence with HEP for Lt shoulder (once pt seen by ortho MD)    Time  8    Period  Weeks    Status  New      OT LONG TERM GOAL #2   Title  Pt to be min assist for UE dressing and bathing    Time  8    Period  Weeks    Status  New      OT LONG TERM GOAL #3   Title  Pt to cut food and brush hair w/ A/E independently    Time  8    Period  Weeks    Status  New      OT LONG TERM GOAL #4   Title  Pt to perform toilet transfer w/ mod assist using correct DME prn    Time  8    Period  Weeks    Status  New      OT LONG TERM  GOAL #5   Title  Pt to perform LE dressing at max assist, LE bathing at min assist w/ use of LH sponge    Time  8    Period  Weeks    Status  New            Plan - 11/08/18 1225    Clinical Impression Statement  Patient showing improved postural control and functional mobility which will help with tasks such as toileting.    OT Frequency  2x / week    OT Duration  8 weeks    OT Treatment/Interventions  Self-care/ADL training;Therapeutic exercise;Functional Mobility Training;Neuromuscular education;Splinting;Manual Therapy;Therapeutic activities;DME and/or AE instruction;Aquatic Therapy;Passive range of motion;Patient/family education;Visual/perceptual remediation/compensation    Plan  monitor BP, continue trunk control, and ADLS    Consulted and Agree with Plan of Care  Patient       Patient will benefit from skilled therapeutic intervention in order to improve the following deficits and impairments:           Visit Diagnosis: Abnormal posture  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 Acute CVA (cerebrovascular accident) ([place]) 10/01/2018  . Physical deconditioning 12/19/2017  . Chronic kidney disease (CKD), stage III (moderate) (HCC)   . Hypoalbuminemia due to protein-calorie malnutrition ([place])   . Intractable migraine without status migrainosus   . Reactive depression   . Hemiparesis affecting right side as late effect of stroke ([place])   . Slow transit constipation   . PAF (paroxysmal atrial fibrillation) ([place])   . Dysphasia, post-stroke   . COPD (chronic obstructive pulmonary disease) ([place]) 10/29/2017  . Chronic diastolic CHF (congestive heart failure) ([place]) 10/29/2017  . Hypothyroidism 10/29/2017  .  Essential hypertension 10/29/2017  . GERD (gastroesophageal reflux disease) 10/29/2017  . Anxiety 10/29/2017  . 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 Hyperlipidemia 04/20/2013    Mariah Milling, OTR/L 11/08/2018, 12:27 PM  [facility] [address] [place], [place], [address] Phone: [phone]   Fax:  [phone]  Name: Holly Flores MRN: [id number] Date of Birth: 1945/05/18

## 2018-11-10 ENCOUNTER — Other Ambulatory Visit: Payer: Self-pay

## 2018-11-10 ENCOUNTER — Ambulatory Visit: Payer: Medicare Other | Admitting: Occupational Therapy

## 2018-11-10 ENCOUNTER — Ambulatory Visit: Payer: Medicare Other | Admitting: Physical Therapy

## 2018-11-10 VITALS — BP 150/98

## 2018-11-10 DIAGNOSIS — R262 Difficulty in walking, not elsewhere classified: Secondary | ICD-10-CM

## 2018-11-10 DIAGNOSIS — M6281 Muscle weakness (generalized): Secondary | ICD-10-CM

## 2018-11-10 DIAGNOSIS — M21371 Foot drop, right foot: Secondary | ICD-10-CM

## 2018-11-10 DIAGNOSIS — R29818 Other symptoms and signs involving the nervous system: Secondary | ICD-10-CM

## 2018-11-10 DIAGNOSIS — I69351 Hemiplegia and hemiparesis following cerebral infarction affecting right dominant side: Secondary | ICD-10-CM

## 2018-11-10 DIAGNOSIS — R293 Abnormal posture: Secondary | ICD-10-CM

## 2018-11-10 NOTE — Therapy (Signed)
[facility] [address] [place], [place], [address] Phone: [phone]   Fax:  [phone]  Physical Therapy Treatment  Patient Details  Name: Holly Flores MRN: [id number] Date of Birth: 01/30/1946 Referring Provider (PT): Octavio Graves, DO   Encounter Date: 11/10/2018  PT End of Session - 11/10/18 1437    Visit Number  26    Number of Visits  37    Date for PT Re-Evaluation  12/19/18    Authorization Type  Progress note every 10th visit; KX modifier at 15th visit.    PT Start Time  1155    PT Stop Time  1235    PT Time Calculation (min)  40 min    Activity Tolerance  Patient tolerated treatment well   feeling hot during standing with Stedy - vitals WNL   Behavior During Therapy  WFL for tasks assessed/performed       Past Medical History:  Diagnosis Date  . Anxiety   . Arthritis    "all over" (12/20/2017)  . Central retinal vein occlusion of left eye 01/22/2015  . CHF (congestive heart failure) ([place])   . Chronic bronchitis ([place])    "get it most years" (12/20/2017)  . Chronic lower back pain   . CKD (chronic kidney disease), stage II   . COPD (chronic obstructive pulmonary disease) ([place])   . CVA (cerebral vascular accident) ([place]) 12/30/2014   OCULAR  LEFT  EYE   . Depression   . Fibromyalgia   . GERD (gastroesophageal reflux disease)   . Goiter   . History of colonic polyps 05/14/2014  . Hyperlipidemia   . Hypertension   . Hypothyroidism   . Kidney cysts    left side  . 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 Migraine    "couple times/wk; been having them for a long time" (12/20/2017)  . Myocardial infarction [facility])    "one dr said I did; one said I didn't;  don't remember when it was" (12/20/2017)  . 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 On home oxygen therapy    "3L; sleep w/it q night" (12/20/2017)  . Pneumonia    "several times" (12/20/2017)  . Shortness  of breath   . Sleep apnea    "stopped mask when I got on the oxygen" (12/20/2017)  . Stroke [facility])     Past Surgical History:  Procedure Laterality Date  . ANTERIOR CERVICAL DECOMP/DISCECTOMY FUSION    . BACK SURGERY    . BREAST BIOPSY     left-non cancerous  . CARDIAC CATHETERIZATION    . CARPAL TUNNEL RELEASE Left   . CATARACT EXTRACTION W/PHACO  09/21/2011   Procedure: CATARACT EXTRACTION PHACO AND INTRAOCULAR LENS PLACEMENT (IOC);  Surgeon: Williams Che, MD;  Location: [facility];  Service: Ophthalmology;  Laterality: Left;  CDE:9.78  . CATARACT EXTRACTION W/PHACO Right 10/15/2014   Procedure: CATARACT EXTRACTION PHACO AND INTRAOCULAR LENS PLACEMENT ([place]);  Surgeon: Williams Che, MD;  Location: [facility];  Service: Ophthalmology;  Laterality: Right;  CDE:4.19  . [place]; 1972  . CHOLECYSTECTOMY OPEN    . COLONOSCOPY  2006   RMR: 1. Internal hemorroids, otherwise normal rectum. 2. Pedunculated polyp at 35 cm. reomved with snare. The remainder of teh colonic mucosa appeared normal.   . COLONOSCOPY  2011   Dr. Gala Romney: multiple ascending colon polyps and rectal polyp, adenomatous  . COLONOSCOPY N/A 05/31/2014   Procedure:  COLONOSCOPY;  Surgeon: Daneil Dolin, MD;  Location[facility];  Service: Endoscopy;  Laterality: N/A;  130  . COLONOSCOPY WITH PROPOFOL N/A 06/24/2017   Procedure: COLONOSCOPY WITH PROPOFOL;  Surgeon: Daneil Dolin, MD;  Location: [facility];  Service: Endoscopy;  Laterality: N/A;  2:15pm  . ESOPHAGOGASTRODUODENOSCOPY  2006   Dr. Gala Romney: Subtle Schatzkis ring, otherwise normal upper GI tract, aside from a small pyloric channell erosion, status post dilation as described above.   Marland Kitchen FLEXIBLE SIGMOIDOSCOPY N/A 11/11/2017   Procedure: FLEXIBLE SIGMOIDOSCOPY;  Surgeon: Otis Brace, MD;  Location: [facility];  Service: Gastroenterology;  Laterality: N/A;  . JOINT REPLACEMENT    . LUMBAR DISC SURGERY  X 2  . POLYPECTOMY  06/24/2017   Procedure:  POLYPECTOMY;  Surgeon: Daneil Dolin, MD;  Location: [facility];  Service: Endoscopy;;  ascending  . POSTERIOR LUMBAR FUSION  ? date 1st fusion ; 07/2006   previous L4-5 Ray threaded fusion cage; Exploration of L4-5 fusion & PLIF 07/22/2006/notes 07/22/2006  . TONSILLECTOMY    . TOTAL KNEE ARTHROPLASTY Right 05/24/2013   Procedure: TOTAL KNEE ARTHROPLASTY;  Surgeon: [name], MD;  Location: [facility];  Service: Orthopedics;  Laterality: Right;  . TOTAL KNEE ARTHROPLASTY Left   . VAGINAL HYSTERECTOMY      Vitals:   11/10/18 1424  BP: (!) 150/98    Subjective Assessment - 11/10/18 1424    Subjective  Received from OT who reports patient's BP is elevated today: 190/105.  OT unable to work with patient but did assist pt with setting up appointment with PCP tomorrow.  Daughter with pt today.  Pt reports her larger shoes came today but after she left for her appointment.    Patient is accompained by:  Family member   grand daughter   Pertinent History  left sided CVA 10/29/17, Chronic CHF, AFib, COPD, Chronic Kidney Disease stage III, hx of bil TKA, Lumbar surgery    Patient Stated Goals  move around better in the home and improve function    Currently in Pain?  No/denies    Pain Onset  More than a month ago                       [facility] Adult PT Treatment/Exercise - 11/10/18 1430      Transfers   Transfers  Sit to Stand;Stand to Sit    Sit to Stand  3: Mod assist    Stand to Sit  4: Min assist      Ambulation/Gait   Ambulation/Gait  Yes    Ambulation/Gait Assistance  3: Mod assist;2: Max assist    Ambulation/Gait Assistance Details  providing facilitation for L lateral weight shift but decreased assistance required today to initiate swing phase.  Decreased assistance required for stabilization of RLE in stance phase.    Ambulation Distance (Feet)  40 Feet    Assistive device  Rolling walker   with R hand orthosis   Gait Pattern  Step-to pattern;Decreased step length -  left;Decreased stance time - right;Decreased hip/knee flexion - right;Decreased dorsiflexion - right;Decreased weight shift to left;Trunk flexed    Ambulation Surface  Level;Indoor    Gait Comments  Performed with current shoes, will perform with new shoes next session      Therapeutic Activites    Therapeutic Activities  Other Therapeutic Activities    Other Therapeutic Activities  With OT present continued discussion with patient and daughter regarding patient's  CVA risk with uncontrolled HTN and participating in strenuous activity (therapy) when BP is elevated.  Also discussed clinic's policy for BP and ceasing therapy.  Discussed other factors that may be contributing to HTN: stress and diet.  Pt and daughter to discuss with PCP tomorrow.  Letter written to physician requesting recommendations/guidance for BP guidelines and assistance with BP management.  Following discussion BP re-assessed by PT with vitals demonstrating overall drop in systolic and diastolic from one hour prior.   Following gait training pt's BP remained stable.             PT Education - 11/10/18 1437    Education Details  see TA; will need to schedule more therapy visits    Person(s) Educated  Patient;Child(ren)    Methods  Explanation;Demonstration;Handout    Comprehension  Verbalized understanding;Returned demonstration       PT Short Term Goals - 10/20/18 1726      PT SHORT TERM GOAL #1   Title  Pt and family will be independent with supine, seated and standing HEP    Time  4    Status  On-going    Target Date  11/19/18      PT SHORT TERM GOAL #2   Title  Family will demonstrate safe use of Stedy for transfers at home    Time  4    Period  Weeks    Status  New    Target Date  11/19/18      PT SHORT TERM GOAL #3   Title  Pt will ambulate x 25' with RW with hand orthosis, trial AFO and mod-max A of one therapist    Time  4    Period  Weeks    Status  New    Target Date  11/19/18        PT  Long Term Goals - 10/20/18 1725      PT LONG TERM GOAL #1   Title  Patient and family will be independent with final HEP recommendations    Time  8    Period  Weeks    Status  On-going    Target Date  12/19/18      PT LONG TERM GOAL #2   Title  Patient will perform sit <> stand from wheelchair with min A and transfer stand pivot with RW and AFO with Mod A of one person    Baseline  Min A to stand, not transferring with RW yet    Time  8    Period  Weeks    Status  Partially Met    Target Date  12/19/18      PT LONG TERM GOAL #3   Title  Pt will perform bed mobility on flat mat with min A    Time  8    Period  Weeks    Status  On-going    Target Date  12/19/18      PT LONG TERM GOAL #4   Title  Family will demonstrate independent ability to don appropriate AFO with R shoe    Time  8    Period  Weeks    Status  On-going    Target Date  12/19/18      PT LONG TERM GOAL #5   Title  Patient will ambulate 40 feet or greater with min A, AFO, and LRAD to safely navigate around home.    Time  8    Period  Weeks  Status  On-going    Target Date  12/19/18            Plan - 11/10/18 1438    Clinical Impression Statement  Patient's BP was too high to participate in OT but one hour later BP had decreased to level safe to participate in PT and remained stable during gait training.  Did continue to provide education to pt and daughter as to why therapy is unable to work with pt when BP is elevated due to risk for another CVA and damage to cardiovascular system.  Pt to see PCP tomorrow to discuss management of BP.  Pt was able to continue with gait training with PT today with pt demonstrating improved and faster activation of R knee extension and improved ability to weight shift and advance RLE as well as stabilize through RLE in stance phase.  Pt to bring new shoes next session and will assess AFO fit and continue gait training next session.    Personal Factors and Comorbidities   Age;Comorbidity 3+;Time since onset of injury/illness/exacerbation    Examination-Activity Limitations  Lift;Dressing;Self Feeding;Transfers;Toileting;Stand;Locomotion Level;Reach Overhead;Bed Mobility    Rehab Potential  Fair    PT Frequency  2x / week    PT Duration  8 weeks    PT Treatment/Interventions  ADLs/Self Care Home Management;Electrical Stimulation;Gait training;Stair training;Functional mobility training;Balance training;Therapeutic exercise;Therapeutic activities;Neuromuscular re-education;Wheelchair mobility training;Manual techniques;Passive range of motion;Patient/family education;DME Instruction;Energy conservation;Orthotic Fit/Training    PT Next Visit Plan  MONITOR BP EVERY SESSION WITH MANUAL CUFF ON LUE; Training with new AFO and shoes - donning and skin checks; start wearing 2 hours/day. Sit<>stand from mat with RW, stand pivot transfers with RW, gait with RW. Nustep with R hand orthosis. Seated mirror therapy for LE. Stedy transfers and sit <> stand on Stedy without UE support. Seated on large rockerboard or inverted BOSU performing lateral tilts or anterior/posterior for sit <> stand. Work on bed mobility on flat bed. 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 Acute CVA (cerebrovascular accident) ([place]) 10/01/2018  . Physical deconditioning 12/19/2017  . Chronic kidney disease (CKD), stage III (moderate) (HCC)   . Hypoalbuminemia due to protein-calorie malnutrition ([place])   . Intractable migraine without status migrainosus   . Reactive depression   . Hemiparesis affecting right side as late effect of stroke ([place])   . Slow transit constipation   . PAF (paroxysmal atrial fibrillation) ([place])   . Dysphasia, post-stroke   . COPD (chronic obstructive pulmonary disease) ([place]) 10/29/2017  . Chronic diastolic CHF (congestive heart failure) ([place]) 10/29/2017  . Hypothyroidism 10/29/2017  . Essential hypertension 10/29/2017  . GERD (gastroesophageal reflux disease) 10/29/2017  . Anxiety 10/29/2017  . 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  Hyperlipidemia 04/20/2013   Rico Junker, PT, DPT 11/10/18    2:42 PM    [facility] [address] [place], [place], [address] Phone: [phone]   Fax:  [phone]  Name: Holly Flores MRN: [id number] Date of Birth: [birth year]

## 2018-11-10 NOTE — Therapy (Signed)
[facility] [address] [place], [place], [address] Phone: [phone]   Fax:  [phone]  Occupational Therapy Treatment  Patient Details  Name: Holly Flores MRN: [id number] Date of Birth: 25-Feb-1945 Referring Provider (OT): Dr. Octavio Graves   Encounter Date: 11/10/2018  OT End of Session - 11/10/18 1151    Visit Number  8    Number of Visits  16    Date for OT Re-Evaluation  11/23/18    Authorization Type  [employer] primary, [employer] secondary    Authorization - Visit Number  8    Authorization - Number of Visits  10    OT Start Time  [id number]   Pt only charged for 15 min. due to BP issues   OT Stop Time  1145    OT Time Calculation (min)  40 min    Activity Tolerance  Patient tolerated treatment well       Past Medical History:  Diagnosis Date  . Anxiety   . Arthritis    "all over" (12/20/2017)  . Central retinal vein occlusion of left eye 01/22/2015  . CHF (congestive heart failure) ([place])   . Chronic bronchitis ([place])    "get it most years" (12/20/2017)  . Chronic lower back pain   . CKD (chronic kidney disease), stage II   . COPD (chronic obstructive pulmonary disease) ([place])   . CVA (cerebral vascular accident) ([place]) 12/30/2014   OCULAR  LEFT  EYE   . Depression   . Fibromyalgia   . GERD (gastroesophageal reflux disease)   . Goiter   . History of colonic polyps 05/14/2014  . Hyperlipidemia   . Hypertension   . Hypothyroidism   . Kidney cysts    left side  . 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 Migraine    "couple times/wk; been having them for a long time" (12/20/2017)  . Myocardial infarction [facility])    "one dr said I did; one said I didn't;  don't remember when it was" (12/20/2017)  . 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 On home oxygen therapy    "3L; sleep w/it q night" (12/20/2017)  . Pneumonia    "several times" (12/20/2017)  . Shortness of breath   .  Sleep apnea    "stopped mask when I got on the oxygen" (12/20/2017)  . Stroke [facility])     Past Surgical History:  Procedure Laterality Date  . ANTERIOR CERVICAL DECOMP/DISCECTOMY FUSION    . BACK SURGERY    . BREAST BIOPSY     left-non cancerous  . CARDIAC CATHETERIZATION    . CARPAL TUNNEL RELEASE Left   . CATARACT EXTRACTION W/PHACO  09/21/2011   Procedure: CATARACT EXTRACTION PHACO AND INTRAOCULAR LENS PLACEMENT (IOC);  Surgeon: Williams Che, MD;  Location: [facility];  Service: Ophthalmology;  Laterality: Left;  CDE:9.78  . CATARACT EXTRACTION W/PHACO Right 10/15/2014   Procedure: CATARACT EXTRACTION PHACO AND INTRAOCULAR LENS PLACEMENT ([place]);  Surgeon: Williams Che, MD;  Location: [facility];  Service: Ophthalmology;  Laterality: Right;  CDE:4.19  . [place]; 1972  . CHOLECYSTECTOMY OPEN    . COLONOSCOPY  2006   RMR: 1. Internal hemorroids, otherwise normal rectum. 2. Pedunculated polyp at 35 cm. reomved with snare. The remainder of teh colonic mucosa appeared normal.   . COLONOSCOPY  2011   Dr. Gala Romney: multiple ascending colon polyps and rectal polyp, adenomatous  . COLONOSCOPY N/A 05/31/2014  Procedure: COLONOSCOPY;  Surgeon: Daneil Dolin, MD;  [facility];  Service: Endoscopy;  Laterality: N/A;  130  . COLONOSCOPY WITH PROPOFOL N/A 06/24/2017   Procedure: COLONOSCOPY WITH PROPOFOL;  Surgeon: Daneil Dolin, MD;  [facility];  Service: Endoscopy;  Laterality: N/A;  2:15pm  . ESOPHAGOGASTRODUODENOSCOPY  2006   Dr. Gala Romney: Subtle Schatzkis ring, otherwise normal upper GI tract, aside from a small pyloric channell erosion, status post dilation as described above.   Marland Kitchen FLEXIBLE SIGMOIDOSCOPY N/A 11/11/2017   Procedure: FLEXIBLE SIGMOIDOSCOPY;  Surgeon: Otis Brace, MD;  Location: [facility];  Service: Gastroenterology;  Laterality: N/A;  . JOINT REPLACEMENT    . LUMBAR DISC SURGERY  X 2  . POLYPECTOMY  06/24/2017   Procedure: POLYPECTOMY;   Surgeon: Daneil Dolin, MD;  [facility];  Service: Endoscopy;;  ascending  . POSTERIOR LUMBAR FUSION  ? date 1st fusion ; 07/2006   previous L4-5 Ray threaded fusion cage; Exploration of L4-5 fusion & PLIF 07/22/2006/notes 07/22/2006  . TONSILLECTOMY    . TOTAL KNEE ARTHROPLASTY Right 05/24/2013   Procedure: TOTAL KNEE ARTHROPLASTY;  Surgeon: [name], MD;  Location: [facility];  Service: Orthopedics;  Laterality: Right;  . TOTAL KNEE ARTHROPLASTY Left   . VAGINAL HYSTERECTOMY      There were no vitals filed for this visit.  Subjective Assessment - 11/10/18 1109    Subjective   My back is sore today    Pertinent History  CVA 10/2017. PMH: CHF, COPD, CKD (Stage II), fibromyalgia, depression. (Husband, who is primary caregiver, also has cancer)    Limitations  montior BP, obesity, needs DME to transfer (sliding board or stand assist)    Special Tests  BP = 190/105    Currently in Pain?  Yes    Pain Score  4     Pain Location  Back    Pain Orientation  Lower    Pain Descriptors / Indicators  Aching;Sore    Pain Type  Chronic pain    Pain Onset  More than a month ago    Pain Frequency  Intermittent    Aggravating Factors   one position too long, rainy weather    Pain Relieving Factors  changing positions       Transferred patient to mat w/o sliding board using scoot pivot transfer w/ min assist.  BP = 190/105. Pt denied symptoms of CVA. Transferred pt back to wheelchair and called PCP practice. Appointment was made for following morning w/ MD at 8:45. Daughter returned near end of treatment time and Pt/daughter made aware of MD appointment date/time. Pt was seen by P.T. afterwards for further education/discussion - see note for details.                       OT Short Term Goals - 09/29/18 1423      OT SHORT TERM GOAL #1   Title  Independent with HEP for RUE (Caregiver assisted and self ROM as able)    Time  4    Period  Weeks    Status  On-going       OT SHORT TERM GOAL #2   Title  Pt/family to verbalize understanding with A/E (rocker knife, adapted shoes, extended brush, LH sponge, etc) to increase independence with BADLS    Time  4    Period  Weeks    Status  On-going      OT SHORT  TERM GOAL #3   Title  Pt/family to verbalize understanding with use of stand assist (Steady) with toileting and transfers to decrease burden of care on caregiver    Time  4    Period  Weeks    Status  On-going      OT SHORT TERM GOAL #4   Title  Pt to demo sliding board transfers consistently w/ mod assist or less for level transfer from w/c to/from bed    Time  4    Period  Weeks    Status  Achieved        OT Long Term Goals - 08/23/18 1618      OT LONG TERM GOAL #1   Title  Pt to demo independence with HEP for Lt shoulder (once pt seen by ortho MD)    Time  8    Period  Weeks    Status  New      OT LONG TERM GOAL #2   Title  Pt to be min assist for UE dressing and bathing    Time  8    Period  Weeks    Status  New      OT LONG TERM GOAL #3   Title  Pt to cut food and brush hair w/ A/E independently    Time  8    Period  Weeks    Status  New      OT LONG TERM GOAL #4   Title  Pt to perform toilet transfer w/ mod assist using correct DME prn    Time  8    Period  Weeks    Status  New      OT LONG TERM GOAL #5   Title  Pt to perform LE dressing at max assist, LE bathing at min assist w/ use of LH sponge    Time  8    Period  Weeks    Status  New            Plan - 11/10/18 1152    Clinical Impression Statement  Pt unable to be seen for full time today due to BP issues. Therapist spent majority of session checking BP and calling PCP practice to set up appoitnment for patient    Occupational performance deficits (Please refer to evaluation for details):  ADL's;IADL's;Rest and Sleep    Body Structure / Function / Physical Skills  ADL;ROM;Vision;IADL;Edema;Body mechanics;Endurance;Mobility;Strength;Coordination;Obesity;UE  functional use;Pain;Decreased knowledge of use of DME    Rehab Potential  Fair    OT Frequency  2x / week    OT Duration  8 weeks    OT Treatment/Interventions  Self-care/ADL training;Therapeutic exercise;Functional Mobility Training;Neuromuscular education;Splinting;Manual Therapy;Therapeutic activities;DME and/or AE instruction;Aquatic Therapy;Passive range of motion;Patient/family education;Visual/perceptual remediation/compensation    Plan  Monitor BP, may need to place pt on hold until BP better managed    Consulted and Agree with Plan of Care  Patient;Family member/caregiver    Family Member Consulted  DAUGHTER       Patient will benefit from skilled therapeutic intervention in order to improve the following deficits and impairments:   Body Structure / Function / Physical Skills: ADL, ROM, Vision, IADL, Edema, Body mechanics, Endurance, Mobility, Strength, Coordination, Obesity, UE functional use, Pain, Decreased knowledge of use of DME       Visit Diagnosis: 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 Acute CVA (cerebrovascular accident) ([place]) 10/01/2018  . Physical deconditioning 12/19/2017  . Chronic kidney disease (CKD), stage III (moderate) (HCC)   . Hypoalbuminemia due to protein-calorie malnutrition ([place])   . Intractable migraine without status migrainosus   . Reactive depression   . Hemiparesis affecting right side as late effect of stroke ([place])   . Slow transit constipation   . PAF (paroxysmal atrial fibrillation) ([place])   . Dysphasia, post-stroke   . COPD (chronic obstructive pulmonary disease) ([place]) 10/29/2017  . Chronic diastolic CHF (congestive heart failure) ([place]) 10/29/2017  . Hypothyroidism 10/29/2017  . Essential hypertension 10/29/2017  . GERD (gastroesophageal reflux disease) 10/29/2017  .  Anxiety 10/29/2017  . 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 Hyperlipidemia 04/20/2013    Carey Bullocks, OTR/L 11/10/2018, 12:14 PM  [facility] [address] [place], [place], [address] Phone: [phone]   Fax:  [phone]  Name: Holly Flores MRN: [id number] Date of Birth: 08/06/1945

## 2018-11-10 NOTE — Patient Instructions (Signed)
Letter written for pt and daughter to take to PCP tomorrow requesting further guidelines for BP and therapy treatment and request for assistance with BP management to allow pt to participate in therapy sessions.  November 10, 2018  Hello Dr. Lorel Monaco,  Physical therapy and occupational therapy have been working with Holly Flores over the past few months and intermittently her blood pressure increases to around 190/105 but can also be as low as 140/90.  Per our policy and due to her history of CVA - we are not able to work with her if her BP is >180/>100 and often this means she has to cancel appointments or is unable to participate in therapy appointments after making the long trip to [place].   Please advise if you have any other recommendations or BP guidelines for therapy and if there is anything else that can be done from a medical stand point to help stabilize her blood pressure.    Thank you for your assistance,   Rico Junker, PT, DPT [place] Baseman, OT

## 2018-11-15 ENCOUNTER — Other Ambulatory Visit: Payer: Self-pay

## 2018-11-15 ENCOUNTER — Ambulatory Visit: Payer: Medicare Other | Admitting: Occupational Therapy

## 2018-11-15 DIAGNOSIS — I69351 Hemiplegia and hemiparesis following cerebral infarction affecting right dominant side: Secondary | ICD-10-CM

## 2018-11-15 DIAGNOSIS — R293 Abnormal posture: Secondary | ICD-10-CM

## 2018-11-15 NOTE — Therapy (Signed)
[facility] [address] [place], [place], [address] Phone: [phone]   Fax:  [phone]  Occupational Therapy Treatment  Patient Details  Name: Holly Flores MRN: [id number] Date of Birth: November 07, 1945 Referring Provider (OT): Dr. Octavio Graves   Encounter Date: 11/15/2018  OT End of Session - 11/15/18 1430    Visit Number  9    Number of Visits  16    Date for OT Re-Evaluation  11/23/18    Authorization Type  MCR primary, [employer] secondary    Authorization - Visit Number  9    Authorization - Number of Visits  10    OT Start Time  1145    OT Stop Time  1230    OT Time Calculation (min)  45 min    Activity Tolerance  Patient tolerated treatment well    Behavior During Therapy  [facility] for tasks assessed/performed       Past Medical History:  Diagnosis Date  . Anxiety   . Arthritis    "all over" (12/20/2017)  . Central retinal vein occlusion of left eye 01/22/2015  . CHF (congestive heart failure) ([place])   . Chronic bronchitis ([place])    "get it most years" (12/20/2017)  . Chronic lower back pain   . CKD (chronic kidney disease), stage II   . COPD (chronic obstructive pulmonary disease) ([place])   . CVA (cerebral vascular accident) ([place]) 12/30/2014   OCULAR  LEFT  EYE   . Depression   . Fibromyalgia   . GERD (gastroesophageal reflux disease)   . Goiter   . History of colonic polyps 05/14/2014  . Hyperlipidemia   . Hypertension   . Hypothyroidism   . Kidney cysts    left side  . 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 Migraine    "couple times/wk; been having them for a long time" (12/20/2017)  . Myocardial infarction [facility])    "one dr said I did; one said I didn't;  don't remember when it was" (12/20/2017)  . 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 On home oxygen therapy    "3L; sleep w/it q night" (12/20/2017)  . Pneumonia    "several times" (12/20/2017)  . Shortness of  breath   . Sleep apnea    "stopped mask when I got on the oxygen" (12/20/2017)  . Stroke [facility])     Past Surgical History:  Procedure Laterality Date  . ANTERIOR CERVICAL DECOMP/DISCECTOMY FUSION    . BACK SURGERY    . BREAST BIOPSY     left-non cancerous  . CARDIAC CATHETERIZATION    . CARPAL TUNNEL RELEASE Left   . CATARACT EXTRACTION W/PHACO  09/21/2011   Procedure: CATARACT EXTRACTION PHACO AND INTRAOCULAR LENS PLACEMENT (IOC);  Surgeon: Williams Che, MD;  Location: [facility];  Service: Ophthalmology;  Laterality: Left;  CDE:9.78  . CATARACT EXTRACTION W/PHACO Right 10/15/2014   Procedure: CATARACT EXTRACTION PHACO AND INTRAOCULAR LENS PLACEMENT ([place]);  Surgeon: Williams Che, MD;  Location: [facility];  Service: Ophthalmology;  Laterality: Right;  CDE:4.19  . [place]; 1972  . CHOLECYSTECTOMY OPEN    . COLONOSCOPY  2006   RMR: 1. Internal hemorroids, otherwise normal rectum. 2. Pedunculated polyp at 35 cm. reomved with snare. The remainder of teh colonic mucosa appeared normal.   . COLONOSCOPY  2011   Dr. Gala Romney: multiple ascending colon polyps and rectal polyp, adenomatous  . COLONOSCOPY N/A 05/31/2014  Procedure: COLONOSCOPY;  Surgeon: Daneil Dolin, MD;  [facility];  Service: Endoscopy;  Laterality: N/A;  130  . COLONOSCOPY WITH PROPOFOL N/A 06/24/2017   Procedure: COLONOSCOPY WITH PROPOFOL;  Surgeon: Daneil Dolin, MD;  [facility];  Service: Endoscopy;  Laterality: N/A;  2:15pm  . ESOPHAGOGASTRODUODENOSCOPY  2006   Dr. Gala Romney: Subtle Schatzkis ring, otherwise normal upper GI tract, aside from a small pyloric channell erosion, status post dilation as described above.   Marland Kitchen FLEXIBLE SIGMOIDOSCOPY N/A 11/11/2017   Procedure: FLEXIBLE SIGMOIDOSCOPY;  Surgeon: Otis Brace, MD;  Location: [facility];  Service: Gastroenterology;  Laterality: N/A;  . JOINT REPLACEMENT    . LUMBAR DISC SURGERY  X 2  . POLYPECTOMY  06/24/2017   Procedure:  POLYPECTOMY;  Surgeon: Daneil Dolin, MD;  [facility];  Service: Endoscopy;;  ascending  . POSTERIOR LUMBAR FUSION  ? date 1st fusion ; 07/2006   previous L4-5 Ray threaded fusion cage; Exploration of L4-5 fusion & PLIF 07/22/2006/notes 07/22/2006  . TONSILLECTOMY    . TOTAL KNEE ARTHROPLASTY Right 05/24/2013   Procedure: TOTAL KNEE ARTHROPLASTY;  Surgeon: [name], MD;  Location: [facility];  Service: Orthopedics;  Laterality: Right;  . TOTAL KNEE ARTHROPLASTY Left   . VAGINAL HYSTERECTOMY      There were no vitals filed for this visit.  Subjective Assessment - 11/15/18 1429    Subjective   They increased my BP meds    Pertinent History  CVA 10/2017. PMH: CHF, COPD, CKD (Stage II), fibromyalgia, depression. (Husband, who is primary caregiver, also has cancer)    Limitations  montior BP, obesity, needs DME to transfer (sliding board or stand assist)    Special Tests  BP = 135/82    Currently in Pain?  No/denies       BP = 135/82  Transfer towards weak side w/ min assist. Worked on trunk control and crossing midline bilaterally w/ guided assist for RUE. Wt bearing through BUE's in prep for sit-stand w/ pt actively lifting bottom off mat. Progressed to sit to stand w/ mod assist - pt pushes towards hemiplegic side and cued to come forward and towards strong side for less assist needed - also placed mirror in front of patient to assist w/ finding pt's center.                       OT Short Term Goals - 09/29/18 1423      OT SHORT TERM GOAL #1   Title  Independent with HEP for RUE (Caregiver assisted and self ROM as able)    Time  4    Period  Weeks    Status  On-going      OT SHORT TERM GOAL #2   Title  Pt/family to verbalize understanding with A/E (rocker knife, adapted shoes, extended brush, LH sponge, etc) to increase independence with BADLS    Time  4    Period  Weeks    Status  On-going      OT SHORT TERM GOAL #3   Title  Pt/family to verbalize  understanding with use of stand assist (Steady) with toileting and transfers to decrease burden of care on caregiver    Time  4    Period  Weeks    Status  On-going      OT SHORT TERM GOAL #4   Title  Pt to demo sliding board transfers consistently w/ mod assist  or less for level transfer from w/c to/from bed    Time  4    Period  Weeks    Status  Achieved        OT Long Term Goals - 08/23/18 1618      OT LONG TERM GOAL #1   Title  Pt to demo independence with HEP for Lt shoulder (once pt seen by ortho MD)    Time  8    Period  Weeks    Status  New      OT LONG TERM GOAL #2   Title  Pt to be min assist for UE dressing and bathing    Time  8    Period  Weeks    Status  New      OT LONG TERM GOAL #3   Title  Pt to cut food and brush hair w/ A/E independently    Time  8    Period  Weeks    Status  New      OT LONG TERM GOAL #4   Title  Pt to perform toilet transfer w/ mod assist using correct DME prn    Time  8    Period  Weeks    Status  New      OT LONG TERM GOAL #5   Title  Pt to perform LE dressing at max assist, LE bathing at min assist w/ use of LH sponge    Time  8    Period  Weeks    Status  New            Plan - 11/15/18 1430    Clinical Impression Statement  Pt w/ midline shift and pushes towards weak side during transitional movements.    Occupational performance deficits (Please refer to evaluation for details):  ADL's;IADL's;Rest and Sleep    Body Structure / Function / Physical Skills  ADL;ROM;Vision;IADL;Edema;Body mechanics;Endurance;Mobility;Strength;Coordination;Obesity;UE functional use;Pain;Decreased knowledge of use of DME    Rehab Potential  Fair    OT Frequency  2x / week    OT Duration  8 weeks    OT Treatment/Interventions  Self-care/ADL training;Therapeutic exercise;Functional Mobility Training;Neuromuscular education;Splinting;Manual Therapy;Therapeutic activities;DME and/or AE instruction;Aquatic Therapy;Passive range of  motion;Patient/family education;Visual/perceptual remediation/compensation    Plan  10TH PROGRESS NOTE, monitor BP, Standing at counter    Consulted and Agree with Plan of Care  Patient       Patient will benefit from skilled therapeutic intervention in order to improve the following deficits and impairments:   Body Structure / Function / Physical Skills: ADL, ROM, Vision, IADL, Edema, Body mechanics, Endurance, Mobility, Strength, Coordination, Obesity, UE functional use, Pain, Decreased knowledge of use of DME       Visit Diagnosis: 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 Acute CVA (cerebrovascular accident) ([place]) 10/01/2018  . Physical deconditioning 12/19/2017  . Chronic kidney disease (CKD), stage III (moderate) (HCC)   . Hypoalbuminemia due to protein-calorie malnutrition ([place])   . Intractable migraine without status migrainosus   . Reactive depression   . Hemiparesis affecting right side as late effect of stroke ([place])   . Slow transit constipation   . PAF (paroxysmal atrial fibrillation) ([place])   . Dysphasia, post-stroke   . COPD (chronic obstructive pulmonary disease) ([place]) 10/29/2017  . Chronic diastolic CHF (congestive heart failure) ([place]) 10/29/2017  . Hypothyroidism  10/29/2017  . Essential hypertension 10/29/2017  . GERD (gastroesophageal reflux disease) 10/29/2017  . Anxiety 10/29/2017  . 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 Hyperlipidemia 04/20/2013    Carey Bullocks, OTR/L 11/15/2018, 2:32 PM  [facility] [address] [place], [place], [address] Phone: [phone]   Fax:  [phone]  Name: Holly Flores MRN: [id number] Date of Birth: Dec 24, 1945

## 2018-11-17 ENCOUNTER — Ambulatory Visit: Payer: Medicare Other | Admitting: Occupational Therapy

## 2018-11-17 ENCOUNTER — Ambulatory Visit: Payer: Medicare Other | Admitting: Physical Therapy

## 2018-11-22 ENCOUNTER — Ambulatory Visit: Payer: Medicare Other | Attending: *Deleted | Admitting: Occupational Therapy

## 2018-11-22 ENCOUNTER — Other Ambulatory Visit: Payer: Self-pay

## 2018-11-22 DIAGNOSIS — R2689 Other abnormalities of gait and mobility: Secondary | ICD-10-CM | POA: Diagnosis present

## 2018-11-22 DIAGNOSIS — M25612 Stiffness of left shoulder, not elsewhere classified: Secondary | ICD-10-CM | POA: Diagnosis present

## 2018-11-22 DIAGNOSIS — I69351 Hemiplegia and hemiparesis following cerebral infarction affecting right dominant side: Secondary | ICD-10-CM | POA: Diagnosis not present

## 2018-11-22 DIAGNOSIS — M6281 Muscle weakness (generalized): Secondary | ICD-10-CM | POA: Diagnosis present

## 2018-11-22 DIAGNOSIS — R2681 Unsteadiness on feet: Secondary | ICD-10-CM | POA: Insufficient documentation

## 2018-11-22 DIAGNOSIS — R293 Abnormal posture: Secondary | ICD-10-CM | POA: Diagnosis present

## 2018-11-22 DIAGNOSIS — R29818 Other symptoms and signs involving the nervous system: Secondary | ICD-10-CM

## 2018-11-22 DIAGNOSIS — M21371 Foot drop, right foot: Secondary | ICD-10-CM | POA: Diagnosis present

## 2018-11-22 DIAGNOSIS — R262 Difficulty in walking, not elsewhere classified: Secondary | ICD-10-CM | POA: Diagnosis present

## 2018-11-22 NOTE — Therapy (Signed)
[facility] [address] [place], [place], [address] Phone: [phone]   Fax:  [phone]  Occupational Therapy Treatment  Patient Details  Name: Holly Flores MRN: [id number] Date of Birth: 08-Sep-1945 Referring Provider (OT): Dr. Octavio Graves   Encounter Date: 11/22/2018  OT End of Session - 11/22/18 1416    Visit Number  10    Number of Visits  19    Date for OT Re-Evaluation  12/23/18    Authorization Type  MCR primary, [employer] secondary    Authorization - Visit Number  10    Authorization - Number of Visits  10    OT Start Time  1320    OT Stop Time  1408    OT Time Calculation (min)  48 min    Activity Tolerance  Patient tolerated treatment well    Behavior During Therapy  [facility] for tasks assessed/performed       Past Medical History:  Diagnosis Date  . Anxiety   . Arthritis    "all over" (12/20/2017)  . Central retinal vein occlusion of left eye 01/22/2015  . CHF (congestive heart failure) ([place])   . Chronic bronchitis ([place])    "get it most years" (12/20/2017)  . Chronic lower back pain   . CKD (chronic kidney disease), stage II   . COPD (chronic obstructive pulmonary disease) ([place])   . CVA (cerebral vascular accident) ([place]) 12/30/2014   OCULAR  LEFT  EYE   . Depression   . Fibromyalgia   . GERD (gastroesophageal reflux disease)   . Goiter   . History of colonic polyps 05/14/2014  . Hyperlipidemia   . Hypertension   . Hypothyroidism   . Kidney cysts    left side  . 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 Migraine    "couple times/wk; been having them for a long time" (12/20/2017)  . Myocardial infarction [facility])    "one dr said I did; one said I didn't;  don't remember when it was" (12/20/2017)  . 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 On home oxygen therapy    "3L; sleep w/it q night" (12/20/2017)  . Pneumonia    "several times" (12/20/2017)  . Shortness  of breath   . Sleep apnea    "stopped mask when I got on the oxygen" (12/20/2017)  . Stroke [facility])     Past Surgical History:  Procedure Laterality Date  . ANTERIOR CERVICAL DECOMP/DISCECTOMY FUSION    . BACK SURGERY    . BREAST BIOPSY     left-non cancerous  . CARDIAC CATHETERIZATION    . CARPAL TUNNEL RELEASE Left   . CATARACT EXTRACTION W/PHACO  09/21/2011   Procedure: CATARACT EXTRACTION PHACO AND INTRAOCULAR LENS PLACEMENT (IOC);  Surgeon: Williams Che, MD;  Location: [facility];  Service: Ophthalmology;  Laterality: Left;  CDE:9.78  . CATARACT EXTRACTION W/PHACO Right 10/15/2014   Procedure: CATARACT EXTRACTION PHACO AND INTRAOCULAR LENS PLACEMENT ([place]);  Surgeon: Williams Che, MD;  Location: [facility];  Service: Ophthalmology;  Laterality: Right;  CDE:4.19  . [place]; 1972  . CHOLECYSTECTOMY OPEN    . COLONOSCOPY  2006   RMR: 1. Internal hemorroids, otherwise normal rectum. 2. Pedunculated polyp at 35 cm. reomved with snare. The remainder of teh colonic mucosa appeared normal.   . COLONOSCOPY  2011   Dr. Gala Romney: multiple ascending colon polyps and rectal polyp, adenomatous  . COLONOSCOPY N/A 05/31/2014  Procedure: COLONOSCOPY;  Surgeon: Daneil Dolin, MD;  [facility];  Service: Endoscopy;  Laterality: N/A;  130  . COLONOSCOPY WITH PROPOFOL N/A 06/24/2017   Procedure: COLONOSCOPY WITH PROPOFOL;  Surgeon: Daneil Dolin, MD;  [facility];  Service: Endoscopy;  Laterality: N/A;  2:15pm  . ESOPHAGOGASTRODUODENOSCOPY  2006   Dr. Gala Romney: Subtle Schatzkis ring, otherwise normal upper GI tract, aside from a small pyloric channell erosion, status post dilation as described above.   Marland Kitchen FLEXIBLE SIGMOIDOSCOPY N/A 11/11/2017   Procedure: FLEXIBLE SIGMOIDOSCOPY;  Surgeon: Otis Brace, MD;  Location: [facility];  Service: Gastroenterology;  Laterality: N/A;  . JOINT REPLACEMENT    . LUMBAR DISC SURGERY  X 2  . POLYPECTOMY  06/24/2017   Procedure:  POLYPECTOMY;  Surgeon: Daneil Dolin, MD;  [facility];  Service: Endoscopy;;  ascending  . POSTERIOR LUMBAR FUSION  ? date 1st fusion ; 07/2006   previous L4-5 Ray threaded fusion cage; Exploration of L4-5 fusion & PLIF 07/22/2006/notes 07/22/2006  . TONSILLECTOMY    . TOTAL KNEE ARTHROPLASTY Right 05/24/2013   Procedure: TOTAL KNEE ARTHROPLASTY;  Surgeon: [name], MD;  Location: [facility];  Service: Orthopedics;  Laterality: Right;  . TOTAL KNEE ARTHROPLASTY Left   . VAGINAL HYSTERECTOMY      There were no vitals filed for this visit.  Subjective Assessment - 11/22/18 1331    Pertinent History  CVA 10/2017. PMH: CHF, COPD, CKD (Stage II), fibromyalgia, depression. (Husband, who is primary caregiver, also has cancer)    Limitations  montior BP, obesity, needs DME to transfer (sliding board or stand assist)    Special Tests  BP = 135/82    Currently in Pain?  No/denies       BP = 135/82 Daughter present today for all education, review of goals/progress, and discussion of goals going forward for renewal period. Reviewed initial HEP issued and reprinted for daughter. Pt verbalized understanding Reviewed/assessed remaining goals and pt/family's main O.T. goals going forward. Pt/daughter expressed biggest need to be able to transfer to [facility] for toileting while standing (for clothes management). Also discussed importance of improving trunk control for dynamic sitting tasks (bathing, dressing, toilet hygiene)                      OT Short Term Goals - 11/22/18 1418      OT SHORT TERM GOAL #1   Title  Independent with HEP for RUE (Caregiver assisted and self ROM as able)    Time  4    Period  Weeks    Status  Achieved      OT SHORT TERM GOAL #2   Title  Pt/family to verbalize understanding with A/E (rocker knife, adapted shoes, extended brush, LH sponge, etc) to increase independence with BADLS    Time  4    Period  Weeks    Status  Achieved      OT SHORT  TERM GOAL #3   Title  Pt/family to verbalize understanding with use of stand assist (Steady) with toileting and transfers to decrease burden of care on caregiver    Time  4    Period  Weeks    Status  Achieved      OT SHORT TERM GOAL #4   Title  Pt to demo sliding board transfers consistently w/ mod assist or less for level transfer from w/c to/from bed    Time  4  Period  Weeks    Status  Achieved        OT Long Term Goals - 11/22/18 1418      OT LONG TERM GOAL #1   Title  Pt to demo independence with HEP for Lt shoulder (once pt seen by ortho MD)    Time  8    Period  Weeks    Status  Deferred   Pt has not seen ortho MD yet     OT LONG TERM GOAL #2   Title  Pt to be min assist for UE bathing, and mod assist or less for UE dressing    Time  4    Period  Weeks    Status  Revised      OT LONG TERM GOAL #3   Title  Pt to cut food and brush hair w/ A/E independently    Time  8    Period  Weeks    Status  Partially Met   needs to obtain long handled/extended brush for independence for bruhsing hair     OT LONG TERM GOAL #4   Title  Pt to perform toilet transfer w/ mod assist using correct DME prn    Time  8    Period  Weeks    Status  Achieved      OT LONG TERM GOAL #5   Title  Pt to perform LE dressing at max assist, LE bathing at min assist w/ use of LH sponge    Time  4    Period  Weeks    Status  On-going      Long Term Additional Goals   Additional Long Term Goals  Yes      OT LONG TERM GOAL #6   Title  Pt to perform stand pivot transfer w/ close supervision only and demo standing ability for caregiver to perform clothes management for toileting and transfers to [facility]    Time  4    Period  Weeks    Status  New      OT LONG TERM GOAL #7   Title  Pt to demo adequate trunk control to simulate wiping and LE dressing w/o LOB    Time  4    Period  Weeks    Status  New            Plan - 11/22/18 1423    Clinical Impression Statement  This 10th  progress note is for dates 08/23/18 through 11/23/18 - pt has met all STG's and 2 LTG's at this time, however pt has had delays in care due to missed visits, high BP, and hospitalization, as well as missed weeks. Pt now appears to have BP under better control with increased BP meds and showing steady progress. Renewal completed today as well with focus the next 4 weeks on 2 new goals, 1 ongoing goal and 1 revised goal - see LTG section.    Occupational performance deficits (Please refer to evaluation for details):  ADL's;IADL's;Rest and Sleep    Body Structure / Function / Physical Skills  ADL;ROM;Vision;IADL;Edema;Body mechanics;Endurance;Mobility;Strength;Coordination;Obesity;UE functional use;Pain;Decreased knowledge of use of DME    Rehab Potential  Fair    Comorbidities impacting occupational performance description:  obesity, CHF, COPD, Lt shoulder limitations    OT Frequency  2x / week    OT Duration  4 weeks   (4 additional weeks)   OT Treatment/Interventions  Self-care/ADL training;Therapeutic exercise;Functional Mobility Training;Neuromuscular education;Splinting;Manual Therapy;Therapeutic activities;DME and/or AE instruction;Aquatic  Therapy;Passive range of motion;Patient/family education;Visual/perceptual remediation/compensation    Plan  monitor BP, practice UE dressing, simulate LE dressing working on trunk control    Consulted and Agree with Plan of Care  Patient;Family member/caregiver    Family Member Consulted  DAUGHTER       Patient will benefit from skilled therapeutic intervention in order to improve the following deficits and impairments:   Body Structure / Function / Physical Skills: ADL, ROM, Vision, IADL, Edema, Body mechanics, Endurance, Mobility, Strength, Coordination, Obesity, UE functional use, Pain, Decreased knowledge of use of DME       Visit Diagnosis: 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 Acute CVA (cerebrovascular accident) ([place]) 10/01/2018  . Physical deconditioning 12/19/2017  . Chronic kidney disease (CKD), stage III (moderate)   . Hypoalbuminemia due to protein-calorie malnutrition ([place])   . Intractable migraine without status migrainosus   . Reactive depression   . Hemiparesis affecting right side as late effect of stroke ([place])   . Slow transit constipation   . PAF (paroxysmal atrial fibrillation) ([place])   . Dysphasia, post-stroke   . COPD (chronic obstructive pulmonary disease) ([place]) 10/29/2017  . Chronic diastolic CHF (congestive heart failure) ([place]) 10/29/2017  . Hypothyroidism 10/29/2017  . Essential hypertension 10/29/2017  . GERD (gastroesophageal reflux disease) 10/29/2017  . Anxiety 10/29/2017  . 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 Hyperlipidemia 04/20/2013    Carey Bullocks, OTR/L 11/22/2018, 2:29 PM  [facility] [address] [place], [place], [address] Phone: [phone]   Fax:  [phone]  Name: Holly Flores MRN: [id number] Date of Birth: 02-16-1946

## 2018-11-25 ENCOUNTER — Encounter

## 2018-11-25 ENCOUNTER — Ambulatory Visit: Payer: Medicare Other | Admitting: Physical Therapy

## 2018-11-25 ENCOUNTER — Other Ambulatory Visit: Payer: Self-pay

## 2018-11-25 VITALS — BP 118/74

## 2018-11-25 DIAGNOSIS — R293 Abnormal posture: Secondary | ICD-10-CM

## 2018-11-25 DIAGNOSIS — M6281 Muscle weakness (generalized): Secondary | ICD-10-CM

## 2018-11-25 DIAGNOSIS — I69351 Hemiplegia and hemiparesis following cerebral infarction affecting right dominant side: Secondary | ICD-10-CM

## 2018-11-25 NOTE — Therapy (Signed)
[facility] [address] [place], [place], [address] Phone: [phone]   Fax:  [phone]  Physical Therapy Treatment  Patient Details  Name: Holly Flores MRN: [id number] Date of Birth: 13-Jan-1946 Referring Provider (PT): Octavio Graves, DO   Encounter Date: 11/25/2018  PT End of Session - 11/25/18 1557    Visit Number  27    Number of Visits  37    Date for PT Re-Evaluation  12/19/18    Authorization Type  Progress note every 10th visit; KX modifier at 15th visit.    PT Start Time  1408    PT Stop Time  1449    PT Time Calculation (min)  41 min    Equipment Utilized During Treatment  Gait belt    Activity Tolerance  Patient tolerated treatment well   feeling hot during standing with Stedy - vitals WNL   Behavior During Therapy  WFL for tasks assessed/performed       Past Medical History:  Diagnosis Date  . Anxiety   . Arthritis    "all over" (12/20/2017)  . Central retinal vein occlusion of left eye 01/22/2015  . CHF (congestive heart failure) ([place])   . Chronic bronchitis ([place])    "get it most years" (12/20/2017)  . Chronic lower back pain   . CKD (chronic kidney disease), stage II   . COPD (chronic obstructive pulmonary disease) ([place])   . CVA (cerebral vascular accident) ([place]) 12/30/2014   OCULAR  LEFT  EYE   . Depression   . Fibromyalgia   . GERD (gastroesophageal reflux disease)   . Goiter   . History of colonic polyps 05/14/2014  . Hyperlipidemia   . Hypertension   . Hypothyroidism   . Kidney cysts    left side  . 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 Migraine    "couple times/wk; been having them for a long time" (12/20/2017)  . Myocardial infarction [facility])    "one dr said I did; one said I didn't;  don't remember when it was" (12/20/2017)  . 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 On home oxygen therapy    "3L; sleep w/it q night" (12/20/2017)  .  Pneumonia    "several times" (12/20/2017)  . Shortness of breath   . Sleep apnea    "stopped mask when I got on the oxygen" (12/20/2017)  . Stroke [facility])     Past Surgical History:  Procedure Laterality Date  . ANTERIOR CERVICAL DECOMP/DISCECTOMY FUSION    . BACK SURGERY    . BREAST BIOPSY     left-non cancerous  . CARDIAC CATHETERIZATION    . CARPAL TUNNEL RELEASE Left   . CATARACT EXTRACTION W/PHACO  09/21/2011   Procedure: CATARACT EXTRACTION PHACO AND INTRAOCULAR LENS PLACEMENT (IOC);  Surgeon: Williams Che, MD;  Location: [facility];  Service: Ophthalmology;  Laterality: Left;  CDE:9.78  . CATARACT EXTRACTION W/PHACO Right 10/15/2014   Procedure: CATARACT EXTRACTION PHACO AND INTRAOCULAR LENS PLACEMENT ([place]);  Surgeon: Williams Che, MD;  Location: [facility];  Service: Ophthalmology;  Laterality: Right;  CDE:4.19  . [place]; 1972  . CHOLECYSTECTOMY OPEN    . COLONOSCOPY  2006   RMR: 1. Internal hemorroids, otherwise normal rectum. 2. Pedunculated polyp at 35 cm. reomved with snare. The remainder of teh colonic mucosa appeared normal.   . COLONOSCOPY  2011   Dr. Gala Romney: multiple ascending colon polyps and rectal  polyp, adenomatous  . COLONOSCOPY N/A 05/31/2014   Procedure: COLONOSCOPY;  Surgeon: Daneil Dolin, MD;  Location: [facility];  Service: Endoscopy;  Laterality: N/A;  130  . COLONOSCOPY WITH PROPOFOL N/A 06/24/2017   Procedure: COLONOSCOPY WITH PROPOFOL;  Surgeon: Daneil Dolin, MD;  [facility];  Service: Endoscopy;  Laterality: N/A;  2:15pm  . ESOPHAGOGASTRODUODENOSCOPY  2006   Dr. Gala Romney: Subtle Schatzkis ring, otherwise normal upper GI tract, aside from a small pyloric channell erosion, status post dilation as described above.   Marland Kitchen FLEXIBLE SIGMOIDOSCOPY N/A 11/11/2017   Procedure: FLEXIBLE SIGMOIDOSCOPY;  Surgeon: Otis Brace, MD;  Location: [facility];  Service: Gastroenterology;  Laterality: N/A;  . JOINT REPLACEMENT    . LUMBAR DISC  SURGERY  X 2  . POLYPECTOMY  06/24/2017   Procedure: POLYPECTOMY;  Surgeon: Daneil Dolin, MD;  Location: [facility];  Service: Endoscopy;;  ascending  . POSTERIOR LUMBAR FUSION  ? date 1st fusion ; 07/2006   previous L4-5 Ray threaded fusion cage; Exploration of L4-5 fusion & PLIF 07/22/2006/notes 07/22/2006  . TONSILLECTOMY    . TOTAL KNEE ARTHROPLASTY Right 05/24/2013   Procedure: TOTAL KNEE ARTHROPLASTY;  Surgeon: [name], MD;  Location: [facility];  Service: Orthopedics;  Laterality: Right;  . TOTAL KNEE ARTHROPLASTY Left   . VAGINAL HYSTERECTOMY      Vitals:   11/25/18 1417  BP: 118/74    Subjective Assessment - 11/25/18 1549    Subjective  Denies any falls or changes.  Reports her BP has been better.  Has her new shoes but has not tried yet during PT sessions.    Patient is accompained by:  Family member   grand daughter   Pertinent History  left sided CVA 10/29/17, Chronic CHF, AFib, COPD, Chronic Kidney Disease stage III, hx of bil TKA, Lumbar surgery    Patient Stated Goals  move around better in the home and improve function    Currently in Pain?  No/denies         [facility] Adult PT Treatment/Exercise - 11/25/18 0001      Transfers   Transfers  Sit to Stand;Stand to Sit    Sit to Stand  3: Mod assist    Sit to Stand Details  Manual facilitation for weight shifting;Manual facilitation for placement;Manual facilitation for weight bearing    Stand to Sit  4: Min assist    Transfer via Lift Equipment  Stedy    Comments  Stedy transfer at end of session from mat table > w/c      Ambulation/Gait   Ambulation/Gait  No    Pre-Gait Activities  Performed >10 reps x >4 sets of sit<>stand at stedy.  Pt using L UE to pull up at times then some reps reapeated without UE assist.  Used mirror in front of pt for visual cues.  Pt needing assist to control R knee and for terminal extension along with cues and manual facilitation at R hip for hip extension.  Pt able to stand > 30 seconds  with each bout.  RUE was supported on Stedy with PTA holding R UE on to assist.  Pt tends to lean to L and protract R hip in standing.  Performed several reps of sit<>stand from mat to stedy during session with min-mod assist and pt pulling up on Stedy.  Pt needs assist for foot positioning as well.      Posture/Postural Control   Posture/Postural Control  Postural  limitations    Postural Limitations  Posterior pelvic tilt;Weight shift left    Posture Comments  In short sitting at edge of mat with mirror for visual cueing: midline alightment and weight shifting towards R, multiple reps        PT Short Term Goals - 10/20/18 1726      PT SHORT TERM GOAL #1   Title  Pt and family will be independent with supine, seated and standing HEP    Time  4    Status  On-going    Target Date  11/19/18      PT SHORT TERM GOAL #2   Title  Family will demonstrate safe use of Stedy for transfers at home    Time  4    Period  Weeks    Status  New    Target Date  11/19/18      PT SHORT TERM GOAL #3   Title  Pt will ambulate x 25' with RW with hand orthosis, trial AFO and mod-max A of one therapist    Time  4    Period  Weeks    Status  New    Target Date  11/19/18        PT Long Term Goals - 10/20/18 1725      PT LONG TERM GOAL #1   Title  Patient and family will be independent with final HEP recommendations    Time  8    Period  Weeks    Status  On-going    Target Date  12/19/18      PT LONG TERM GOAL #2   Title  Patient will perform sit <> stand from wheelchair with min A and transfer stand pivot with RW and AFO with Mod A of one person    Baseline  Min A to stand, not transferring with RW yet    Time  8    Period  Weeks    Status  Partially Met    Target Date  12/19/18      PT LONG TERM GOAL #3   Title  Pt will perform bed mobility on flat mat with min A    Time  8    Period  Weeks    Status  On-going    Target Date  12/19/18      PT LONG TERM GOAL #4   Title  Family will  demonstrate independent ability to don appropriate AFO with R shoe    Time  8    Period  Weeks    Status  On-going    Target Date  12/19/18      PT LONG TERM GOAL #5   Title  Patient will ambulate 40 feet or greater with min A, AFO, and LRAD to safely navigate around home.    Time  8    Period  Weeks    Status  On-going    Target Date  12/19/18            Plan - 11/25/18 1557    Clinical Impression Statement  Skilled session focused on transfers, posture and standing posture and tolerance.  Pt responds well to visual and verbal cues but still with little return in RLE and requires support of stedy.  Continue PT per POC.    Personal Factors and Comorbidities  Age;Comorbidity 3+;Time since onset of injury/illness/exacerbation    Examination-Activity Limitations  Lift;Dressing;Self Feeding;Transfers;Toileting;Stand;Locomotion Level;Reach Overhead;Bed Mobility    Rehab Potential  Fair  PT Frequency  2x / week    PT Duration  8 weeks    PT Treatment/Interventions  ADLs/Self Care Home Management;Electrical Stimulation;Gait training;Stair training;Functional mobility training;Balance training;Therapeutic exercise;Therapeutic activities;Neuromuscular re-education;Wheelchair mobility training;Manual techniques;Passive range of motion;Patient/family education;DME Instruction;Energy conservation;Orthotic Fit/Training    PT Next Visit Plan  MONITOR BP EVERY SESSION WITH MANUAL CUFF ON LUE; Begin checking STG's.Training with new AFO and shoes - donning and skin checks; start wearing 2 hours/day. Sit<>stand from mat with RW, stand pivot transfers with RW, gait with RW. Nustep with R hand orthosis. Seated mirror therapy for LE. Stedy transfers and sit <> stand on Stedy without UE support. Seated on large rockerboard or inverted BOSU performing lateral tilts or anterior/posterior for sit <> stand. Work on bed mobility on flat bed. 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 Acute CVA (cerebrovascular accident) ([place]) 10/01/2018  . Physical deconditioning 12/19/2017  . Chronic kidney disease (CKD), stage III (moderate)   . Hypoalbuminemia due to protein-calorie malnutrition ([place])   . Intractable migraine without status migrainosus   . Reactive depression   . Hemiparesis affecting right side as late effect of stroke ([place])   . Slow transit constipation   . PAF (paroxysmal atrial fibrillation) ([place])   . Dysphasia, post-stroke   . COPD (chronic obstructive pulmonary disease) ([place]) 10/29/2017  . Chronic diastolic CHF (congestive heart failure) ([place]) 10/29/2017  . Hypothyroidism 10/29/2017  . Essential hypertension 10/29/2017  . GERD (gastroesophageal reflux disease) 10/29/2017  . Anxiety 10/29/2017  . 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 Hyperlipidemia 04/20/2013   Narda Bonds, PTA [facility] 11/25/18 4:00 PM Phone: [phone] Fax: [facility] [address] [place], [place], [address] Phone: [phone]   Fax:  [phone]  Name: Holly Flores MRN: [id number] Date of Birth: 1945-03-03

## 2018-11-28 ENCOUNTER — Other Ambulatory Visit: Payer: Self-pay

## 2018-11-28 ENCOUNTER — Encounter: Payer: Self-pay | Admitting: Physical Therapy

## 2018-11-28 ENCOUNTER — Ambulatory Visit: Payer: Medicare Other | Admitting: Occupational Therapy

## 2018-11-28 ENCOUNTER — Ambulatory Visit: Payer: Medicare Other | Admitting: Physical Therapy

## 2018-11-28 VITALS — BP 130/90

## 2018-11-28 DIAGNOSIS — R293 Abnormal posture: Secondary | ICD-10-CM

## 2018-11-28 DIAGNOSIS — I69351 Hemiplegia and hemiparesis following cerebral infarction affecting right dominant side: Secondary | ICD-10-CM

## 2018-11-28 DIAGNOSIS — M21371 Foot drop, right foot: Secondary | ICD-10-CM

## 2018-11-28 DIAGNOSIS — R29818 Other symptoms and signs involving the nervous system: Secondary | ICD-10-CM

## 2018-11-28 DIAGNOSIS — R262 Difficulty in walking, not elsewhere classified: Secondary | ICD-10-CM

## 2018-11-28 DIAGNOSIS — M25612 Stiffness of left shoulder, not elsewhere classified: Secondary | ICD-10-CM

## 2018-11-28 DIAGNOSIS — M6281 Muscle weakness (generalized): Secondary | ICD-10-CM

## 2018-11-28 DIAGNOSIS — R2689 Other abnormalities of gait and mobility: Secondary | ICD-10-CM

## 2018-11-28 NOTE — Therapy (Signed)
[facility] [address] [place], [place], [address] Phone: [phone]   Fax:  [phone]  Physical Therapy Treatment  Patient Details  Name: Holly Flores MRN: [id number] Date of Birth: [birth year] Referring Provider (PT): Drue Second, FNP   Encounter Date: 11/28/2018  PT End of Session - 11/28/18 1440    Visit Number  28    Number of Visits  37    Date for PT Re-Evaluation  12/19/18    Authorization Type  Progress note every 10th visit; KX modifier at 15th visit.    PT Start Time  1150    PT Stop Time  1233    PT Time Calculation (min)  43 min    Activity Tolerance  Patient tolerated treatment well   feeling hot during standing with Stedy - vitals WNL   Behavior During Therapy  WFL for tasks assessed/performed       Past Medical History:  Diagnosis Date  . Anxiety   . Arthritis    "all over" (12/20/2017)  . Central retinal vein occlusion of left eye 01/22/2015  . CHF (congestive heart failure) ([place])   . Chronic bronchitis ([place])    "get it most years" (12/20/2017)  . Chronic lower back pain   . CKD (chronic kidney disease), stage II   . COPD (chronic obstructive pulmonary disease) ([place])   . CVA (cerebral vascular accident) ([place]) 12/30/2014   OCULAR  LEFT  EYE   . Depression   . Fibromyalgia   . GERD (gastroesophageal reflux disease)   . Goiter   . History of colonic polyps 05/14/2014  . Hyperlipidemia   . Hypertension   . Hypothyroidism   . Kidney cysts    left side  . 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 Migraine    "couple times/wk; been having them for a long time" (12/20/2017)  . Myocardial infarction [facility])    "one dr said I did; one said I didn't;  don't remember when it was" (12/20/2017)  . 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 On home oxygen therapy    "3L; sleep w/it q night" (12/20/2017)  . Pneumonia    "several times" (12/20/2017)  .  Shortness of breath   . Sleep apnea    "stopped mask when I got on the oxygen" (12/20/2017)  . Stroke [facility])     Past Surgical History:  Procedure Laterality Date  . ANTERIOR CERVICAL DECOMP/DISCECTOMY FUSION    . BACK SURGERY    . BREAST BIOPSY     left-non cancerous  . CARDIAC CATHETERIZATION    . CARPAL TUNNEL RELEASE Left   . CATARACT EXTRACTION W/PHACO  09/21/2011   Procedure: CATARACT EXTRACTION PHACO AND INTRAOCULAR LENS PLACEMENT (IOC);  Surgeon: Williams Che, MD;  Location: [facility];  Service: Ophthalmology;  Laterality: Left;  CDE:9.78  . CATARACT EXTRACTION W/PHACO Right 10/15/2014   Procedure: CATARACT EXTRACTION PHACO AND INTRAOCULAR LENS PLACEMENT ([place]);  Surgeon: Williams Che, MD;  Location: [facility];  Service: Ophthalmology;  Laterality: Right;  CDE:4.19  . [place]; 1972  . CHOLECYSTECTOMY OPEN    . COLONOSCOPY  2006   RMR: 1. Internal hemorroids, otherwise normal rectum. 2. Pedunculated polyp at 35 cm. reomved with snare. The remainder of teh colonic mucosa appeared normal.   . COLONOSCOPY  2011   Dr. Gala Romney: multiple ascending colon polyps and rectal polyp, adenomatous  . COLONOSCOPY N/A 05/31/2014  Procedure: COLONOSCOPY;  Surgeon: Daneil Dolin, MD;  [facility];  Service: Endoscopy;  Laterality: N/A;  130  . COLONOSCOPY WITH PROPOFOL N/A 06/24/2017   Procedure: COLONOSCOPY WITH PROPOFOL;  Surgeon: Daneil Dolin, MD;  [facility];  Service: Endoscopy;  Laterality: N/A;  2:15pm  . ESOPHAGOGASTRODUODENOSCOPY  2006   Dr. Gala Romney: Subtle Schatzkis ring, otherwise normal upper GI tract, aside from a small pyloric channell erosion, status post dilation as described above.   Marland Kitchen FLEXIBLE SIGMOIDOSCOPY N/A 11/11/2017   Procedure: FLEXIBLE SIGMOIDOSCOPY;  Surgeon: Otis Brace, MD;  Location: [facility];  Service: Gastroenterology;  Laterality: N/A;  . JOINT REPLACEMENT    . LUMBAR DISC SURGERY  X 2  . POLYPECTOMY  06/24/2017    Procedure: POLYPECTOMY;  Surgeon: Daneil Dolin, MD;  Location: [facility];  Service: Endoscopy;;  ascending  . POSTERIOR LUMBAR FUSION  ? date 1st fusion ; 07/2006   previous L4-5 Ray threaded fusion cage; Exploration of L4-5 fusion & PLIF 07/22/2006/notes 07/22/2006  . TONSILLECTOMY    . TOTAL KNEE ARTHROPLASTY Right 05/24/2013   Procedure: TOTAL KNEE ARTHROPLASTY;  Surgeon: [name], MD;  Location: [facility];  Service: Orthopedics;  Laterality: Right;  . TOTAL KNEE ARTHROPLASTY Left   . VAGINAL HYSTERECTOMY      Vitals:   11/28/18 1439  BP: 130/90    Subjective Assessment - 11/28/18 1145    Subjective  Husband came home from hospital.  Family is still rotating to stay with them.  Brought in new shoes with AFO.    Patient is accompained by:  Family member   grand daughter   Pertinent History  left sided CVA 10/29/17, Chronic CHF, AFib, COPD, Chronic Kidney Disease stage III, hx of bil TKA, Lumbar surgery    Patient Stated Goals  move around better in the home and improve function    Currently in Pain?  No/denies         [facility] PT Assessment - 11/28/18 1145      Assessment   Medical Diagnosis  Left sided CVA    Referring Provider (PT)  Drue Second, FNP    Onset Date/Surgical Date  10/29/17    Hand Dominance  Right    Prior Therapy  yes, inpatient                   [facility] Adult PT Treatment/Exercise - 11/28/18 1417      Transfers   Transfers  Sit to Stand;Stand to Sit;Stand Pivot Transfers    Sit to Stand  4: Min assist;3: Mod assist    Sit to Stand Details (indicate cue type and reason)  With AFO donned; mod A to stand from lower w/c, min A to stand from elevated mat with verbal cues for R foot placement and sequencing for sit > stand, assistance shifting body weight forwards over BOS.  Assistance to keep weight forwards once standing    Stand to Sit  4: Min assist    Stand Pivot Transfers  3: Mod assist;2: Max assist    Stand Pivot Transfer Details  (indicate cue type and reason)  with AFO and RW from w/c > mat to L side with verbal and tactile cues for sequencing of turning the RW and pivoting feet; increased assistance to pivot RLE and stabilize when pivoting      Ambulation/Gait   Ambulation/Gait  Yes    Ambulation/Gait Assistance  3: Mod assist    Ambulation/Gait  Assistance Details  with new AFO and shoes; continued to require tactile and verbal cues for full weight shift to LLE in order to advance RLE, assistance to prevent RLE ER during stance and assistance and verbal cues to advance COG fully over RLE during stance phase.  Pt reports that due to new shoes and AFO being heavier, having greater difficulty advancing RLE    Ambulation Distance (Feet)  25 Feet    Assistive device  Rolling walker    Gait Pattern  Step-to pattern;Decreased step length - left;Decreased stance time - right;Decreased hip/knee flexion - right;Decreased dorsiflexion - right;Decreased weight shift to left;Trunk flexed;Step-through pattern    Ambulation Surface  Level;Indoor      Therapeutic Activites    Therapeutic Activities  ADL's    ADL's  Demonstrated to pt and son how to don AFO by placing patient's foot in AFO first and then using shoe horn to place in shoe.  Even in 2E shoe, AFO is too wide and presses out making toe box too shallow.  Pt has significant amounts of space between skin and brace; will contact orthotist to see if AFO can be molded down a bit for improved fit in shoe.  Also advised pt to wear AFO for 1 hour/day and then remove to check skin for any pressure areas; also advised pt that if she experiences too much pressure to remove before the one hour is over.             PT Education - 11/28/18 1439    Education Details  AFO wear schedule; how to don AFO, stand pivot transfers with AFO    Person(s) Educated  Patient;Child(ren)    Methods  Explanation;Demonstration    Comprehension  Verbalized understanding       PT Short Term Goals -  11/28/18 1442      PT SHORT TERM GOAL #1   Title  Pt and family will be independent with supine, seated and standing HEP    Time  4    Status  On-going    Target Date  11/19/18      PT SHORT TERM GOAL #2   Title  Family will demonstrate safe use of Stedy for transfers at home    Time  4    Period  Weeks    Status  On-going    Target Date  11/19/18      PT SHORT TERM GOAL #3   Title  Pt will ambulate x 25' with RW with hand orthosis, trial AFO and mod-max A of one therapist    Time  4    Period  Weeks    Status  Achieved    Target Date  11/19/18        PT Long Term Goals - 10/20/18 1725      PT LONG TERM GOAL #1   Title  Patient and family will be independent with final HEP recommendations    Time  8    Period  Weeks    Status  On-going    Target Date  12/19/18      PT LONG TERM GOAL #2   Title  Patient will perform sit <> stand from wheelchair with min A and transfer stand pivot with RW and AFO with Mod A of one person    Baseline  Min A to stand, not transferring with RW yet    Time  8    Period  Weeks    Status  Partially  Met    Target Date  12/19/18      PT LONG TERM GOAL #3   Title  Pt will perform bed mobility on flat mat with min A    Time  8    Period  Weeks    Status  On-going    Target Date  12/19/18      PT LONG TERM GOAL #4   Title  Family will demonstrate independent ability to don appropriate AFO with R shoe    Time  8    Period  Weeks    Status  On-going    Target Date  12/19/18      PT LONG TERM GOAL #5   Title  Patient will ambulate 40 feet or greater with min A, AFO, and LRAD to safely navigate around home.    Time  8    Period  Weeks    Status  On-going    Target Date  12/19/18            Plan - 11/28/18 1440    Clinical Impression Statement  Performed stand pivot transfer and gait training with new AFO, extra wide shoes and RW; also provided education to son regarding wear schedule and how to don AFO.  Pt having greater  difficulty advancing RLE today; reports it is due to the increase weight of the shoe and AFO.  Pt is making slow but steady progress and has met 1 STG; unable to assess standing aide goal due to pt has not received yet and HEP is still ongoing.  Will continue to address and progress towards LTG.    Personal Factors and Comorbidities  Age;Comorbidity 3+;Time since onset of injury/illness/exacerbation    Examination-Activity Limitations  Lift;Dressing;Self Feeding;Transfers;Toileting;Stand;Locomotion Level;Reach Overhead;Bed Mobility    Rehab Potential  Fair    PT Frequency  2x / week    PT Duration  8 weeks    PT Treatment/Interventions  ADLs/Self Care Home Management;Electrical Stimulation;Gait training;Stair training;Functional mobility training;Balance training;Therapeutic exercise;Therapeutic activities;Neuromuscular re-education;Wheelchair mobility training;Manual techniques;Passive range of motion;Patient/family education;DME Instruction;Energy conservation;Orthotic Fit/Training    PT Next Visit Plan  MONITOR BP EVERY SESSION WITH MANUAL CUFF ON LUE;  How did she tolerate wearing 1 hour a day; did Meghan respond about size of AFO?  SET UP HEP.  Training with new AFO and shoes - donning and skin checks; increase wearing 2 hours/day. Sit<>stand from mat with RW, stand pivot transfers with RW, gait with RW. Nustep with R hand orthosis. Seated mirror therapy for LE. Stedy transfers and sit <> stand on Stedy without UE support. Seated on large rockerboard or inverted BOSU performing lateral tilts or anterior/posterior for sit <> stand. Work on bed mobility on flat bed. 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 Acute CVA (cerebrovascular accident) ([place]) 10/01/2018  . Physical deconditioning 12/19/2017  . Chronic kidney disease (CKD), stage III (moderate)   . Hypoalbuminemia due to protein-calorie malnutrition ([place])   . Intractable migraine without status migrainosus   . Reactive depression   . Hemiparesis affecting right side as late effect of stroke ([place])   . Slow transit constipation   . PAF (paroxysmal atrial fibrillation) ([place])   . Dysphasia, post-stroke   . COPD (chronic obstructive pulmonary disease) ([place]) 10/29/2017  . Chronic diastolic CHF (congestive heart failure) ([place]) 10/29/2017  . Hypothyroidism 10/29/2017  . Essential hypertension 10/29/2017  . GERD (gastroesophageal reflux disease) 10/29/2017  . Anxiety 10/29/2017  . 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 Hyperlipidemia 04/20/2013    Rico Junker, PT, DPT 11/28/18    2:47 PM    [facility] [address] [place], [place], [address] Phone: [phone]   Fax:  [phone]  Name: Holly Flores MRN: [id number] Date of Birth: 07-Sep-1945

## 2018-11-28 NOTE — Therapy (Signed)
[facility] [address] [place], [place], [address] Phone: [phone]   Fax:  [phone]  Occupational Therapy Treatment  Patient Details  Name: Holly Flores MRN: [id number] Date of Birth: Jul 16, 1945 Referring Provider (OT): Dr. Octavio Graves   Encounter Date: 11/28/2018  OT End of Session - 11/28/18 1445    Visit Number  11    Number of Visits  19    Date for OT Re-Evaluation  12/23/18    Authorization Type  M[employer] primary, [employer] secondary    Authorization - Visit Number  11    Authorization - Number of Visits  20    OT Start Time  1105    OT Stop Time  1145    OT Time Calculation (min)  40 min    Activity Tolerance  Patient tolerated treatment well    Behavior During Therapy  [facility] for tasks assessed/performed       Past Medical History:  Diagnosis Date  . Anxiety   . Arthritis    "all over" (12/20/2017)  . Central retinal vein occlusion of left eye 01/22/2015  . CHF (congestive heart failure) ([place])   . Chronic bronchitis ([place])    "get it most years" (12/20/2017)  . Chronic lower back pain   . CKD (chronic kidney disease), stage II   . COPD (chronic obstructive pulmonary disease) ([place])   . CVA (cerebral vascular accident) ([place]) 12/30/2014   OCULAR  LEFT  EYE   . Depression   . Fibromyalgia   . GERD (gastroesophageal reflux disease)   . Goiter   . History of colonic polyps 05/14/2014  . Hyperlipidemia   . Hypertension   . Hypothyroidism   . Kidney cysts    left side  . 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 Migraine    "couple times/wk; been having them for a long time" (12/20/2017)  . Myocardial infarction [facility])    "one dr said I did; one said I didn't;  don't remember when it was" (12/20/2017)  . 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 On home oxygen therapy    "3L; sleep w/it q night" (12/20/2017)  . Pneumonia    "several times" (12/20/2017)  . Shortness  of breath   . Sleep apnea    "stopped mask when I got on the oxygen" (12/20/2017)  . Stroke [facility])     Past Surgical History:  Procedure Laterality Date  . ANTERIOR CERVICAL DECOMP/DISCECTOMY FUSION    . BACK SURGERY    . BREAST BIOPSY     left-non cancerous  . CARDIAC CATHETERIZATION    . CARPAL TUNNEL RELEASE Left   . CATARACT EXTRACTION W/PHACO  09/21/2011   Procedure: CATARACT EXTRACTION PHACO AND INTRAOCULAR LENS PLACEMENT (IOC);  Surgeon: Williams Che, MD;  Location: [facility];  Service: Ophthalmology;  Laterality: Left;  CDE:9.78  . CATARACT EXTRACTION W/PHACO Right 10/15/2014   Procedure: CATARACT EXTRACTION PHACO AND INTRAOCULAR LENS PLACEMENT ([place]);  Surgeon: Williams Che, MD;  Location: [facility];  Service: Ophthalmology;  Laterality: Right;  CDE:4.19  . [place]; 1972  . CHOLECYSTECTOMY OPEN    . COLONOSCOPY  2006   RMR: 1. Internal hemorroids, otherwise normal rectum. 2. Pedunculated polyp at 35 cm. reomved with snare. The remainder of teh colonic mucosa appeared normal.   . COLONOSCOPY  2011   Dr. Gala Romney: multiple ascending colon polyps and rectal polyp, adenomatous  . COLONOSCOPY N/A 05/31/2014  Procedure: COLONOSCOPY;  Surgeon: Daneil Dolin, MD;  [facility];  Service: Endoscopy;  Laterality: N/A;  130  . COLONOSCOPY WITH PROPOFOL N/A 06/24/2017   Procedure: COLONOSCOPY WITH PROPOFOL;  Surgeon: Daneil Dolin, MD;  [facility];  Service: Endoscopy;  Laterality: N/A;  2:15pm  . ESOPHAGOGASTRODUODENOSCOPY  2006   Dr. Gala Romney: Subtle Schatzkis ring, otherwise normal upper GI tract, aside from a small pyloric channell erosion, status post dilation as described above.   Marland Kitchen FLEXIBLE SIGMOIDOSCOPY N/A 11/11/2017   Procedure: FLEXIBLE SIGMOIDOSCOPY;  Surgeon: Otis Brace, MD;  Location: [facility];  Service: Gastroenterology;  Laterality: N/A;  . JOINT REPLACEMENT    . LUMBAR DISC SURGERY  X 2  . POLYPECTOMY  06/24/2017   Procedure:  POLYPECTOMY;  Surgeon: Daneil Dolin, MD;  [facility];  Service: Endoscopy;;  ascending  . POSTERIOR LUMBAR FUSION  ? date 1st fusion ; 07/2006   previous L4-5 Ray threaded fusion cage; Exploration of L4-5 fusion & PLIF 07/22/2006/notes 07/22/2006  . TONSILLECTOMY    . TOTAL KNEE ARTHROPLASTY Right 05/24/2013   Procedure: TOTAL KNEE ARTHROPLASTY;  Surgeon: [name], MD;  Location: [facility];  Service: Orthopedics;  Laterality: Right;  . TOTAL KNEE ARTHROPLASTY Left   . VAGINAL HYSTERECTOMY      There were no vitals filed for this visit.  Subjective Assessment - 11/28/18 1141    Pertinent History  CVA 10/2017. PMH: CHF, COPD, CKD (Stage II), fibromyalgia, depression. (Husband, who is primary caregiver, also has cancer)    Limitations  montior BP, obesity, needs DME to transfer (sliding board or stand assist)    Special Tests  BP = 130/80    Currently in Pain?  Yes    Pain Score  8     Pain Location  Shoulder    Pain Orientation  Left    Pain Descriptors / Indicators  Aching    Pain Type  Acute pain    Pain Onset  More than a month ago    Pain Frequency  Intermittent    Aggravating Factors   rainy cold weather    Pain Relieving Factors  rest                   OT Treatments/Exercises (OP) - 11/28/18 0001      ADLs   UB Dressing  Practiced donning/doffing pullover shirt w/ mod assist to don/doff overhead due to LUE shoulder limitations (in ROM and strength) and RUE flaccid. Pt could doff shirt from arms once shirt pulled off head, but required min assist to start donning sleeve RUE    LB Dressing  Practiced doffing shoes and socks w/ reacher mod I level w/ cueing and mod difficulty. Dependent for donning Rt shoe and sock, required max assist to don Lt shoe and sock using shoe horn and stool to prop Lt foot on.     ADL Comments  BP 130/80               OT Short Term Goals - 11/22/18 1418      OT SHORT TERM GOAL #1   Title  Independent with HEP for  RUE (Caregiver assisted and self ROM as able)    Time  4    Period  Weeks    Status  Achieved      OT SHORT TERM GOAL #2   Title  Pt/family to verbalize understanding with A/E (rocker knife, adapted shoes, extended brush,  LH sponge, etc) to increase independence with BADLS    Time  4    Period  Weeks    Status  Achieved      OT SHORT TERM GOAL #3   Title  Pt/family to verbalize understanding with use of stand assist (Steady) with toileting and transfers to decrease burden of care on caregiver    Time  4    Period  Weeks    Status  Achieved      OT SHORT TERM GOAL #4   Title  Pt to demo sliding board transfers consistently w/ mod assist or less for level transfer from w/c to/from bed    Time  4    Period  Weeks    Status  Achieved        OT Long Term Goals - 11/22/18 1418      OT LONG TERM GOAL #1   Title  Pt to demo independence with HEP for Lt shoulder (once pt seen by ortho MD)    Time  8    Period  Weeks    Status  Deferred   Pt has not seen ortho MD yet     OT LONG TERM GOAL #2   Title  Pt to be min assist for UE bathing, and mod assist or less for UE dressing    Time  4    Period  Weeks    Status  Revised      OT LONG TERM GOAL #3   Title  Pt to cut food and brush hair w/ A/E independently    Time  8    Period  Weeks    Status  Partially Met   needs to obtain long handled/extended brush for independence for bruhsing hair     OT LONG TERM GOAL #4   Title  Pt to perform toilet transfer w/ mod assist using correct DME prn    Time  8    Period  Weeks    Status  Achieved      OT LONG TERM GOAL #5   Title  Pt to perform LE dressing at max assist, LE bathing at min assist w/ use of LH sponge    Time  4    Period  Weeks    Status  On-going      Long Term Additional Goals   Additional Long Term Goals  Yes      OT LONG TERM GOAL #6   Title  Pt to perform stand pivot transfer w/ close supervision only and demo standing ability for caregiver to perform clothes  management for toileting and transfers to [facility]    Time  4    Period  Weeks    Status  New      OT LONG TERM GOAL #7   Title  Pt to demo adequate trunk control to simulate wiping and LE dressing w/o LOB    Time  4    Period  Weeks    Status  New            Plan - 11/28/18 1445    Clinical Impression Statement  Pt limited in UE dressing due to Lt shoulder limitations as well as flaccid RUE. Pt limited in LE dressing d/t decreased mobility in RLE as well as decreased flexibility and Lt hand function decreased.    Occupational performance deficits (Please refer to evaluation for details):  ADL's;IADL's;Rest and Sleep    Body Structure / Function / Physical  Skills  ADL;ROM;Vision;IADL;Edema;Body mechanics;Endurance;Mobility;Strength;Coordination;Obesity;UE functional use;Pain;Decreased knowledge of use of DME    Rehab Potential  Fair    Comorbidities impacting occupational performance description:  obesity, CHF, COPD, Lt shoulder limitations    OT Frequency  2x / week    OT Duration  4 weeks    OT Treatment/Interventions  Self-care/ADL training;Therapeutic exercise;Functional Mobility Training;Neuromuscular education;Splinting;Manual Therapy;Therapeutic activities;DME and/or AE instruction;Aquatic Therapy;Passive range of motion;Patient/family education;Visual/perceptual remediation/compensation    Plan  monitor BP, practice stand pivot transfers in prep for toileting/clothes management    Consulted and Agree with Plan of Care  Patient       Patient will benefit from skilled therapeutic intervention in order to improve the following deficits and impairments:   Body Structure / Function / Physical Skills: ADL, ROM, Vision, IADL, Edema, Body mechanics, Endurance, Mobility, Strength, Coordination, Obesity, UE functional use, Pain, Decreased knowledge of use of DME       Visit Diagnosis: 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 Acute CVA (cerebrovascular accident) ([place]) 10/01/2018  . Physical deconditioning 12/19/2017  . Chronic kidney disease (CKD), stage III (moderate)   . Hypoalbuminemia due to protein-calorie malnutrition ([place])   . Intractable migraine without status migrainosus   . Reactive depression   . Hemiparesis affecting right side as late effect of stroke ([place])   . Slow transit constipation   . PAF (paroxysmal atrial fibrillation) ([place])   . Dysphasia, post-stroke   . COPD (chronic obstructive pulmonary disease) ([place]) 10/29/2017  . Chronic diastolic CHF (congestive heart failure) ([place]) 10/29/2017  . Hypothyroidism 10/29/2017  . Essential hypertension 10/29/2017  . GERD (gastroesophageal reflux disease) 10/29/2017  . Anxiety 10/29/2017  . 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 Hyperlipidemia 04/20/2013    Carey Bullocks, OTR/L 11/28/2018, 2:48 PM  [facility] [address] [place], [place], [address] Phone: [phone]   Fax:  [phone]  Name: Holly Flores MRN: [id number] Date of Birth: 1945-07-02

## 2018-12-01 ENCOUNTER — Ambulatory Visit: Payer: Medicare Other | Admitting: Physical Therapy

## 2018-12-01 ENCOUNTER — Ambulatory Visit: Payer: Medicare Other | Admitting: Occupational Therapy

## 2018-12-01 ENCOUNTER — Encounter: Payer: Self-pay | Admitting: Physical Therapy

## 2018-12-01 ENCOUNTER — Other Ambulatory Visit: Payer: Self-pay

## 2018-12-01 DIAGNOSIS — R29818 Other symptoms and signs involving the nervous system: Secondary | ICD-10-CM

## 2018-12-01 DIAGNOSIS — M6281 Muscle weakness (generalized): Secondary | ICD-10-CM

## 2018-12-01 DIAGNOSIS — I69351 Hemiplegia and hemiparesis following cerebral infarction affecting right dominant side: Secondary | ICD-10-CM | POA: Diagnosis not present

## 2018-12-01 DIAGNOSIS — R293 Abnormal posture: Secondary | ICD-10-CM

## 2018-12-01 DIAGNOSIS — R262 Difficulty in walking, not elsewhere classified: Secondary | ICD-10-CM

## 2018-12-01 DIAGNOSIS — M21371 Foot drop, right foot: Secondary | ICD-10-CM

## 2018-12-01 NOTE — Therapy (Signed)
[facility] [address] [place], [place], [address] Phone: [phone]   Fax:  [phone]  Occupational Therapy Treatment  Patient Details  Name: Holly Flores MRN: [id number] Date of Birth: 1946/02/03 Referring Provider (OT): Dr. Octavio Graves   Encounter Date: 12/01/2018  OT End of Session - 12/01/18 1322    Visit Number  12    Number of Visits  19    Date for OT Re-Evaluation  12/23/18    Authorization Type  [employer] primary, [employer] secondary    Authorization - Visit Number  12    Authorization - Number of Visits  20    OT Start Time  1100    OT Stop Time  1145    OT Time Calculation (min)  45 min    Activity Tolerance  Patient tolerated treatment well    Behavior During Therapy  [facility] for tasks assessed/performed       Past Medical History:  Diagnosis Date  . Anxiety   . Arthritis    "all over" (12/20/2017)  . Central retinal vein occlusion of left eye 01/22/2015  . CHF (congestive heart failure) ([place])   . Chronic bronchitis ([place])    "get it most years" (12/20/2017)  . Chronic lower back pain   . CKD (chronic kidney disease), stage II   . COPD (chronic obstructive pulmonary disease) ([place])   . CVA (cerebral vascular accident) ([place]) 12/30/2014   OCULAR  LEFT  EYE   . Depression   . Fibromyalgia   . GERD (gastroesophageal reflux disease)   . Goiter   . History of colonic polyps 05/14/2014  . Hyperlipidemia   . Hypertension   . Hypothyroidism   . Kidney cysts    left side  . 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 Migraine    "couple times/wk; been having them for a long time" (12/20/2017)  . Myocardial infarction [facility])    "one dr said I did; one said I didn't;  don't remember when it was" (12/20/2017)  . 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 On home oxygen therapy    "3L; sleep w/it q night" (12/20/2017)  . Pneumonia    "several times" (12/20/2017)  . Shortness  of breath   . Sleep apnea    "stopped mask when I got on the oxygen" (12/20/2017)  . Stroke [facility])     Past Surgical History:  Procedure Laterality Date  . ANTERIOR CERVICAL DECOMP/DISCECTOMY FUSION    . BACK SURGERY    . BREAST BIOPSY     left-non cancerous  . CARDIAC CATHETERIZATION    . CARPAL TUNNEL RELEASE Left   . CATARACT EXTRACTION W/PHACO  09/21/2011   Procedure: CATARACT EXTRACTION PHACO AND INTRAOCULAR LENS PLACEMENT (IOC);  Surgeon: Williams Che, MD;  Location: [facility];  Service: Ophthalmology;  Laterality: Left;  CDE:9.78  . CATARACT EXTRACTION W/PHACO Right 10/15/2014   Procedure: CATARACT EXTRACTION PHACO AND INTRAOCULAR LENS PLACEMENT ([place]);  Surgeon: Williams Che, MD;  Location: [facility];  Service: Ophthalmology;  Laterality: Right;  CDE:4.19  . [place]; 1972  . CHOLECYSTECTOMY OPEN    . COLONOSCOPY  2006   RMR: 1. Internal hemorroids, otherwise normal rectum. 2. Pedunculated polyp at 35 cm. reomved with snare. The remainder of teh colonic mucosa appeared normal.   . COLONOSCOPY  2011   Dr. Gala Romney: multiple ascending colon polyps and rectal polyp, adenomatous  . COLONOSCOPY N/A 05/31/2014  Procedure: COLONOSCOPY;  Surgeon: Daneil Dolin, MD;  Location: [facility];  Service: Endoscopy;  Laterality: N/A;  130  . COLONOSCOPY WITH PROPOFOL N/A 06/24/2017   Procedure: COLONOSCOPY WITH PROPOFOL;  Surgeon: Daneil Dolin, MD;  Location[facility];  Service: Endoscopy;  Laterality: N/A;  2:15pm  . ESOPHAGOGASTRODUODENOSCOPY  2006   Dr. Gala Romney: Subtle Schatzkis ring, otherwise normal upper GI tract, aside from a small pyloric channell erosion, status post dilation as described above.   Marland Kitchen FLEXIBLE SIGMOIDOSCOPY N/A 11/11/2017   Procedure: FLEXIBLE SIGMOIDOSCOPY;  Surgeon: Otis Brace, MD;  Location: [facility];  Service: Gastroenterology;  Laterality: N/A;  . JOINT REPLACEMENT    . LUMBAR DISC SURGERY  X 2  . POLYPECTOMY  06/24/2017   Procedure:  POLYPECTOMY;  Surgeon: Daneil Dolin, MD;  Location: [facility];  Service: Endoscopy;;  ascending  . POSTERIOR LUMBAR FUSION  ? date 1st fusion ; 07/2006   previous L4-5 Ray threaded fusion cage; Exploration of L4-5 fusion & PLIF 07/22/2006/notes 07/22/2006  . TONSILLECTOMY    . TOTAL KNEE ARTHROPLASTY Right 05/24/2013   Procedure: TOTAL KNEE ARTHROPLASTY;  Surgeon: [name], MD;  Location: [place];  Service: Orthopedics;  Laterality: Right;  . TOTAL KNEE ARTHROPLASTY Left   . VAGINAL HYSTERECTOMY      There were no vitals filed for this visit.  Subjective Assessment - 12/01/18 1321    Subjective   I'm tired    Pertinent History  CVA 10/2017. PMH: CHF, COPD, CKD (Stage II), fibromyalgia, depression. (Husband, who is primary caregiver, also has cancer)    Limitations  montior BP, obesity, needs DME to transfer (sliding board or stand assist)    Special Tests  BP = 130/85    Currently in Pain?  No/denies       Pt seated at mat after P.T. session. Practiced stand pivot transfer to w/c for potential toileting needs, however pt required x2 assist (mod assist x 1 for Rt leg management and cues for foot placement, and min assist x 1 to assist w/ balance when wt shifting) Pt then practiced transferring from w/c back to mat using Steady w/ assist x 1 for Rt leg placement, cues to come upright, and to operate Steady.  Pt stood in steady x 1 min. 2 reps for wt bearing, postural alignment, and in prep for clothes management during toileting. Pt also worked on coming from relaxed position in Steady to upright position x 5 for last part of sit-stand movement.  Seated: worked on trunk control and activation of Rt side of trunk w/ LUE disengaged (supported on ball) while attempting Rt lateral trunk flexion and wt bearing through RUE w/ scapula depression. Pt responded best with tactile cues at head to lead movement. Worked on bending at hips for forward flexion in prep for picking up item off floor and LE  dressing w/ LUE disengaged and RUE wt bearing, followed by bilateral trunk rotation                      OT Short Term Goals - 11/22/18 1418      OT SHORT TERM GOAL #1   Title  Independent with HEP for RUE (Caregiver assisted and self ROM as able)    Time  4    Period  Weeks    Status  Achieved      OT SHORT TERM GOAL #2   Title  Pt/family to verbalize understanding with A/E (  rocker knife, adapted shoes, extended brush, LH sponge, etc) to increase independence with BADLS    Time  4    Period  Weeks    Status  Achieved      OT SHORT TERM GOAL #3   Title  Pt/family to verbalize understanding with use of stand assist (Steady) with toileting and transfers to decrease burden of care on caregiver    Time  4    Period  Weeks    Status  Achieved      OT SHORT TERM GOAL #4   Title  Pt to demo sliding board transfers consistently w/ mod assist or less for level transfer from w/c to/from bed    Time  4    Period  Weeks    Status  Achieved        OT Long Term Goals - 11/22/18 1418      OT LONG TERM GOAL #1   Title  Pt to demo independence with HEP for Lt shoulder (once pt seen by ortho MD)    Time  8    Period  Weeks    Status  Deferred   Pt has not seen ortho MD yet     OT LONG TERM GOAL #2   Title  Pt to be min assist for UE bathing, and mod assist or less for UE dressing    Time  4    Period  Weeks    Status  Revised      OT LONG TERM GOAL #3   Title  Pt to cut food and brush hair w/ A/E independently    Time  8    Period  Weeks    Status  Partially Met   needs to obtain long handled/extended brush for independence for bruhsing hair     OT LONG TERM GOAL #4   Title  Pt to perform toilet transfer w/ mod assist using correct DME prn    Time  8    Period  Weeks    Status  Achieved      OT LONG TERM GOAL #5   Title  Pt to perform LE dressing at max assist, LE bathing at min assist w/ use of LH sponge    Time  4    Period  Weeks    Status  On-going       Long Term Additional Goals   Additional Long Term Goals  Yes      OT LONG TERM GOAL #6   Title  Pt to perform stand pivot transfer w/ close supervision only and demo standing ability for caregiver to perform clothes management for toileting and transfers to [facility]    Time  4    Period  Weeks    Status  New      OT LONG TERM GOAL #7   Title  Pt to demo adequate trunk control to simulate wiping and LE dressing w/o LOB    Time  4    Period  Weeks    Status  New            Plan - 12/01/18 1322    Clinical Impression Statement  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 Pt limited in mobility and requires cues/assist for stand pivot transfers    Occupational performance deficits (Please refer to evaluation for details):  ADL's;IADL's;Rest and Sleep    Body Structure / Function / Physical Skills  ADL;ROM;Vision;IADL;Edema;Body mechanics;Endurance;Mobility;Strength;Coordination;Obesity;UE functional use;Pain;Decreased knowledge  of use of DME    Rehab Potential  Fair    Comorbidities impacting occupational performance description:  obesity, CHF, COPD, Lt shoulder limitations    OT Frequency  2x / week    OT Duration  4 weeks    OT Treatment/Interventions  Self-care/ADL training;Therapeutic exercise;Functional Mobility Training;Neuromuscular education;Splinting;Manual Therapy;Therapeutic activities;DME and/or AE instruction;Aquatic Therapy;Passive range of motion;Patient/family education;Visual/perceptual remediation/compensation    Plan  monitor BP, work w/ steady for toileting    Consulted and Agree with Plan of Care  Patient       Patient will benefit from skilled therapeutic intervention in order to improve the following deficits and impairments:   Body Structure / Function / Physical Skills: ADL, ROM, Vision, IADL, Edema, Body mechanics, Endurance, Mobility, Strength, Coordination, Obesity, UE functional use, Pain, Decreased knowledge of use of DME        Visit Diagnosis: 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 Acute CVA (cerebrovascular accident) ([place]) 10/01/2018  . Physical deconditioning 12/19/2017  . Chronic kidney disease (CKD), stage III (moderate)   . Hypoalbuminemia due to protein-calorie malnutrition ([place])   . Intractable migraine without status migrainosus   . Reactive depression   . Hemiparesis affecting right side as late effect of stroke ([place])   . Slow transit constipation   . PAF (paroxysmal atrial fibrillation) ([place])   . Dysphasia, post-stroke   . COPD (chronic obstructive pulmonary disease) ([place]) 10/29/2017  . Chronic diastolic CHF (congestive heart failure) ([place]) 10/29/2017  . Hypothyroidism 10/29/2017  . Essential hypertension 10/29/2017  . GERD (gastroesophageal reflux disease) 10/29/2017  . Anxiety 10/29/2017  . 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 Hyperlipidemia 04/20/2013    Carey Bullocks, OTR/L 12/01/2018, 1:24 PM  [facility] [address] [place], [place], [address] Phone: [phone]   Fax:  [phone]  Name: Holly Flores MRN: [id number] Date of Birth: 10-28-1945

## 2018-12-01 NOTE — Patient Instructions (Signed)
Access Code: [email or web address]  URL: [email or web address]  Date: 12/01/2018  Prepared by: Misty Stanley   Exercises Seated Long Arc Quad - 10 reps - 2 sets - 1x daily - 7x weekly Seated Knee Flexion - 10 reps - 2 sets - 1x daily - 7x weekly Seated Hip Adduction Squeeze with Ball - 10 reps - 2 sets - 1x daily - 7x weekly Seated Hip Flexion - 10 reps - 2 sets - 1x daily - 7x weekly

## 2018-12-01 NOTE — Therapy (Signed)
[facility] [address] [place], [place], [address] Phone: [phone]   Fax:  [phone]  Physical Therapy Treatment  Patient Details  Name: Holly Flores MRN: [id number] Date of Birth: 07-25-1945 Referring Provider (PT): Drue Second, FNP   Encounter Date: 12/01/2018  PT End of Session - 12/01/18 1129    Visit Number  29    Number of Visits  37    Date for PT Re-Evaluation  12/19/18    Authorization Type  Progress note every 10th visit; KX modifier at 15th visit.    PT Start Time  1015    PT Stop Time  1100    PT Time Calculation (min)  45 min    Activity Tolerance  Patient tolerated treatment well   feeling hot during standing with Stedy - vitals WNL   Behavior During Therapy  WFL for tasks assessed/performed       Past Medical History:  Diagnosis Date  . Anxiety   . Arthritis    "all over" (12/20/2017)  . Central retinal vein occlusion of left eye 01/22/2015  . CHF (congestive heart failure) ([place])   . Chronic bronchitis ([place])    "get it most years" (12/20/2017)  . Chronic lower back pain   . CKD (chronic kidney disease), stage II   . COPD (chronic obstructive pulmonary disease) ([place])   . CVA (cerebral vascular accident) ([place]) 12/30/2014   OCULAR  LEFT  EYE   . Depression   . Fibromyalgia   . GERD (gastroesophageal reflux disease)   . Goiter   . History of colonic polyps 05/14/2014  . Hyperlipidemia   . Hypertension   . Hypothyroidism   . Kidney cysts    left side  . 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 Migraine    "couple times/wk; been having them for a long time" (12/20/2017)  . Myocardial infarction [facility])    "one dr said I did; one said I didn't;  don't remember when it was" (12/20/2017)  . 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 On home oxygen therapy    "3L; sleep w/it q night" (12/20/2017)  . Pneumonia    "several times" (12/20/2017)  .  Shortness of breath   . Sleep apnea    "stopped mask when I got on the oxygen" (12/20/2017)  . Stroke [facility])     Past Surgical History:  Procedure Laterality Date  . ANTERIOR CERVICAL DECOMP/DISCECTOMY FUSION    . BACK SURGERY    . BREAST BIOPSY     left-non cancerous  . CARDIAC CATHETERIZATION    . CARPAL TUNNEL RELEASE Left   . CATARACT EXTRACTION W/PHACO  09/21/2011   Procedure: CATARACT EXTRACTION PHACO AND INTRAOCULAR LENS PLACEMENT (IOC);  Surgeon: Williams Che, MD;  Location: [facility];  Service: Ophthalmology;  Laterality: Left;  CDE:9.78  . CATARACT EXTRACTION W/PHACO Right 10/15/2014   Procedure: CATARACT EXTRACTION PHACO AND INTRAOCULAR LENS PLACEMENT ([place]);  Surgeon: Williams Che, MD;  Location: [facility];  Service: Ophthalmology;  Laterality: Right;  CDE:4.19  . [place]; 1972  . CHOLECYSTECTOMY OPEN    . COLONOSCOPY  2006   RMR: 1. Internal hemorroids, otherwise normal rectum. 2. Pedunculated polyp at 35 cm. reomved with snare. The remainder of teh colonic mucosa appeared normal.   . COLONOSCOPY  2011   Dr. Gala Romney: multiple ascending colon polyps and rectal polyp, adenomatous  . COLONOSCOPY N/A 05/31/2014  Procedure: COLONOSCOPY;  Surgeon: Daneil Dolin, MD;  Location: [facility];  Service: Endoscopy;  Laterality: N/A;  130  . COLONOSCOPY WITH PROPOFOL N/A 06/24/2017   Procedure: COLONOSCOPY WITH PROPOFOL;  Surgeon: Daneil Dolin, MD;  [facility];  Service: Endoscopy;  Laterality: N/A;  2:15pm  . ESOPHAGOGASTRODUODENOSCOPY  2006   Dr. Gala Romney: Subtle Schatzkis ring, otherwise normal upper GI tract, aside from a small pyloric channell erosion, status post dilation as described above.   Marland Kitchen FLEXIBLE SIGMOIDOSCOPY N/A 11/11/2017   Procedure: FLEXIBLE SIGMOIDOSCOPY;  Surgeon: Otis Brace, MD;  Location: [facility];  Service: Gastroenterology;  Laterality: N/A;  . JOINT REPLACEMENT    . LUMBAR DISC SURGERY  X 2  . POLYPECTOMY  06/24/2017    Procedure: POLYPECTOMY;  Surgeon: Daneil Dolin, MD;  [facility]: [facility];  Service: Endoscopy;;  ascending  . POSTERIOR LUMBAR FUSION  ? date 1st fusion ; 07/2006   previous L4-5 Ray threaded fusion cage; Exploration of L4-5 fusion & PLIF 07/22/2006/notes 07/22/2006  . TONSILLECTOMY    . TOTAL KNEE ARTHROPLASTY Right 05/24/2013   Procedure: TOTAL KNEE ARTHROPLASTY;  Surgeon: [name], MD;  Location: [place];  Service: Orthopedics;  Laterality: Right;  . TOTAL KNEE ARTHROPLASTY Left   . VAGINAL HYSTERECTOMY      There were no vitals filed for this visit.  Subjective Assessment - 12/01/18 1118    Subjective  Approved for standing aide - delivered early November.  Had a lot of pressure in her toes when wearing the AFO and new shoe.  Daughter had a hard time donning the AFO without a shoe horn.    Patient is accompained by:  Family member   grand daughter   Pertinent History  left sided CVA 10/29/17, Chronic CHF, AFib, COPD, Chronic Kidney Disease stage III, hx of bil TKA, Lumbar surgery    Patient Stated Goals  move around better in the home and improve function    Currently in Pain?  No/denies                       [facility] Adult PT Treatment/Exercise - 12/01/18 1119      Transfers   Transfers  Sit to Stand;Stand to [employer] Transfers    Sit to Stand  4: Min assist    Sit to Stand Details (indicate cue type and reason)  from w/c with AFO, pushing from w/c.  Cues once standing to weight shift to L and to extend RLE; assistance to place RLE on hand orthosis    Stand to Sit  4: Min assist    Stand to Sit Details  assistance to remove R hand from orthosis; cues to reach back with LLE    Stand Pivot Transfers  3: Mod assist    Stand Pivot Transfer Details (indicate cue type and reason)  with AFO and RW; cues for weight shifting, stepping sequence and positioning of RW when pivoting to L side; assistance to place RLE for WB during stance phase      Therapeutic  Activites    Therapeutic Activities  ADL's    ADL's  Donning AFO with shoe horn; still too tight on toes after wearing for 30-45 minutes.  Will coordiate time for pt to go to orthotist clinic and have orthotist assess for improved fit in shoe to decrease pressure.  Alerted daughter and pt that standing aide would be delivered in November.  Pt also asking about  power w/c; discussed benefit for power w/c for safe and independent mobility in the home and community due to pt's husband unable to assist pt with ambulating and pt unable to safely use manual w/c and RW without significant assistance.  Pt states that she just sits all day in her w/c and has become depressed because all she can do is sit.  Will discuss with physician for order.      Neuro Re-ed    Neuro Re-ed Details   Seated at edge of mat performed mirror therapy for UE and LE; LUE and LLE performing movements with pt focusing on image of "right arm and Right leg" in mirror.  Performed UE tapping and rubbing LE, UE performing pronation/supination, hand opening and closing, clapping.  With LE performed ankle PF/DF, knee extension, knee flexion, hip flexion, hip ADD      Exercises   Exercises  Other Exercises    Other Exercises   Pt performed the exercises below in sitting to perform at home for continued LE strengthening and activation training          Access Code: [email or web address]  URL: [email or web address]  Date: 12/01/2018  Prepared by: Misty Stanley   Exercises Seated Long Arc Quad - 10 reps - 2 sets - 1x daily - 7x weekly Seated Knee Flexion - 10 reps - 2 sets - 1x daily - 7x weekly Seated Hip Adduction Squeeze with Ball - 10 reps - 2 sets - 1x daily - 7x weekly Seated Hip Flexion - 10 reps - 2 sets - 1x daily - 7x weekly     PT Education - 12/01/18 1129    Education Details  see TA; go to [facility] Tuesday before coming to PT/OT    Person(s) Educated  Patient;Child(ren)    Methods  Explanation    Comprehension   Verbalized understanding       PT Short Term Goals - 11/28/18 1442      PT SHORT TERM GOAL #1   Title  Pt and family will be independent with supine, seated and standing HEP    Time  4    Status  On-going    Target Date  11/19/18      PT SHORT TERM GOAL #2   Title  Family will demonstrate safe use of Stedy for transfers at home    Time  4    Period  Weeks    Status  On-going    Target Date  11/19/18      PT SHORT TERM GOAL #3   Title  Pt will ambulate x 25' with RW with hand orthosis, trial AFO and mod-max A of one therapist    Time  4    Period  Weeks    Status  Achieved    Target Date  11/19/18        PT Long Term Goals - 10/20/18 1725      PT LONG TERM GOAL #1   Title  Patient and family will be independent with final HEP recommendations    Time  8    Period  Weeks    Status  On-going    Target Date  12/19/18      PT LONG TERM GOAL #2   Title  Patient will perform sit <> stand from wheelchair with min A and transfer stand pivot with RW and AFO with Mod A of one person    Baseline  Min A to stand, not transferring with RW yet  Time  8    Period  Weeks    Status  Partially Met    Target Date  12/19/18      PT LONG TERM GOAL #3   Title  Pt will perform bed mobility on flat mat with min A    Time  8    Period  Weeks    Status  On-going    Target Date  12/19/18      PT LONG TERM GOAL #4   Title  Family will demonstrate independent ability to don appropriate AFO with R shoe    Time  8    Period  Weeks    Status  On-going    Target Date  12/19/18      PT LONG TERM GOAL #5   Title  Patient will ambulate 40 feet or greater with min A, AFO, and LRAD to safely navigate around home.    Time  8    Period  Weeks    Status  On-going    Target Date  12/19/18            Plan - 12/01/18 1130    Clinical Impression Statement  Continued to focus on assessment of AFO fit and pressure on R foot.  Also continued to practice stand pivot with RW from w/c > mat  with pt requiring decreased assistance today for standing balance but continues to require assistance for placement of R foot.  Continued NMR with mirror therapy to prepare RLE for performing seated exercises for HEP.  Pt tolerated well; will continue to address, provide family education and progress towards more independent mobility.  Pt would benefit from referral for power w/c due to ongoing limitations in independent mobility with MRADL.    Personal Factors and Comorbidities  Age;Comorbidity 3+;Time since onset of injury/illness/exacerbation    Examination-Activity Limitations  Lift;Dressing;Self Feeding;Transfers;Toileting;Stand;Locomotion Level;Reach Overhead;Bed Mobility    Rehab Potential  Fair    PT Frequency  2x / week    PT Duration  8 weeks    PT Treatment/Interventions  ADLs/Self Care Home Management;Electrical Stimulation;Gait training;Stair training;Functional mobility training;Balance training;Therapeutic exercise;Therapeutic activities;Neuromuscular re-education;Wheelchair mobility training;Manual techniques;Passive range of motion;Patient/family education;DME Instruction;Energy conservation;Orthotic Fit/Training    PT Next Visit Plan  MONITOR BP EVERY SESSION WITH MANUAL CUFF ON LUE;  10th visit PN.  Did Meghan trim AFO?  How are exercises going at home?  Training with family - donning AFO, standing aide, wearing schedule.  Stand pivot L and R with RW and AFO. gait with RW. Nustep with R hand orthosis. Seated mirror therapy for LE. Stedy transfers and sit <> stand on Stedy without UE support. Seated on large rockerboard or inverted BOSU performing lateral tilts or anterior/posterior for sit <> stand. Work on bed mobility on flat bed. 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 Acute CVA (cerebrovascular accident) ([place]) 10/01/2018  . Physical deconditioning 12/19/2017  . Chronic kidney disease (CKD), stage III (moderate)   . Hypoalbuminemia due to protein-calorie malnutrition ([place])   . Intractable migraine without status migrainosus   . Reactive depression   . Hemiparesis affecting right side as late effect of stroke ([place])   . Slow transit constipation   . PAF (paroxysmal atrial fibrillation) ([place])   . Dysphasia, post-stroke   . COPD (chronic obstructive pulmonary disease) ([place]) 10/29/2017  . Chronic diastolic CHF (congestive heart failure) ([place]) 10/29/2017  . Hypothyroidism 10/29/2017  . Essential hypertension 10/29/2017  . GERD (gastroesophageal reflux disease) 10/29/2017  . Anxiety 10/29/2017  . 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 Hyperlipidemia 04/20/2013    Rico Junker, PT, DPT 12/01/18    11:59 AM    [facility] [address] [place], [place], [address] Phone: [phone]   Fax:  [phone]  Name: Holly Flores MRN: [id number] Date of Birth: 1945-10-24

## 2018-12-06 ENCOUNTER — Other Ambulatory Visit: Payer: Self-pay

## 2018-12-06 ENCOUNTER — Ambulatory Visit: Payer: Medicare Other | Admitting: Physical Therapy

## 2018-12-06 ENCOUNTER — Ambulatory Visit: Payer: Medicare Other | Admitting: Occupational Therapy

## 2018-12-06 DIAGNOSIS — R29818 Other symptoms and signs involving the nervous system: Secondary | ICD-10-CM

## 2018-12-06 DIAGNOSIS — I69351 Hemiplegia and hemiparesis following cerebral infarction affecting right dominant side: Secondary | ICD-10-CM

## 2018-12-06 DIAGNOSIS — R262 Difficulty in walking, not elsewhere classified: Secondary | ICD-10-CM

## 2018-12-06 DIAGNOSIS — M6281 Muscle weakness (generalized): Secondary | ICD-10-CM

## 2018-12-06 DIAGNOSIS — M21371 Foot drop, right foot: Secondary | ICD-10-CM

## 2018-12-06 DIAGNOSIS — R293 Abnormal posture: Secondary | ICD-10-CM

## 2018-12-06 NOTE — Therapy (Addendum)
[facility] [address] [place], [place], [address] Phone: [phone]   Fax:  [phone]  Physical Therapy Treatment and 10th visit Progress Note  Patient Details  Name: Holly Flores MRN: [id number] Date of Birth: 10-May-1945 Referring Provider (PT): Drue Second, FNP   Encounter Date: 12/06/2018   Progress Note Reporting Period 08/19/2018 to 12/06/2018  See note below for Objective Data and Assessment of Progress/Goals.       PT End of Session - 12/06/18 1214    Visit Number  30    Number of Visits  37    Date for PT Re-Evaluation  12/19/18    Authorization Type  Progress note every 10th visit; KX modifier at 15th visit.    PT Start Time  1105    PT Stop Time  1150    PT Time Calculation (min)  45 min    Equipment Utilized During Treatment  Other (comment)   stedy   Activity Tolerance  Patient tolerated treatment well   feeling hot during standing with Stedy - vitals WNL   Behavior During Therapy  WFL for tasks assessed/performed       Past Medical History:  Diagnosis Date  . Anxiety   . Arthritis    "all over" (12/20/2017)  . Central retinal vein occlusion of left eye 01/22/2015  . CHF (congestive heart failure) ([place])   . Chronic bronchitis ([place])    "get it most years" (12/20/2017)  . Chronic lower back pain   . CKD (chronic kidney disease), stage II   . COPD (chronic obstructive pulmonary disease) ([place])   . CVA (cerebral vascular accident) ([place]) 12/30/2014   OCULAR  LEFT  EYE   . Depression   . Fibromyalgia   . GERD (gastroesophageal reflux disease)   . Goiter   . History of colonic polyps 05/14/2014  . Hyperlipidemia   . Hypertension   . Hypothyroidism   . Kidney cysts    left side  . 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 Migraine    "couple times/wk; been having them for a long time" (12/20/2017)  . Myocardial infarction [facility])     "one dr said I did; one said I didn't;  don't remember when it was" (12/20/2017)  . 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 On home oxygen therapy    "3L; sleep w/it q night" (12/20/2017)  . Pneumonia    "several times" (12/20/2017)  . Shortness of breath   . Sleep apnea    "stopped mask when I got on the oxygen" (12/20/2017)  . Stroke [facility])     Past Surgical History:  Procedure Laterality Date  . ANTERIOR CERVICAL DECOMP/DISCECTOMY FUSION    . BACK SURGERY    . BREAST BIOPSY     left-non cancerous  . CARDIAC CATHETERIZATION    . CARPAL TUNNEL RELEASE Left   . CATARACT EXTRACTION W/PHACO  09/21/2011   Procedure: CATARACT EXTRACTION PHACO AND INTRAOCULAR LENS PLACEMENT (IOC);  Surgeon: Williams Che, MD;  Location: [facility];  Service: Ophthalmology;  Laterality: Left;  CDE:9.78  . CATARACT EXTRACTION W/PHACO Right 10/15/2014   Procedure: CATARACT EXTRACTION PHACO AND INTRAOCULAR LENS PLACEMENT ([place]);  Surgeon: Williams Che, MD;  Location: [facility];  Service: Ophthalmology;  Laterality: Right;  CDE:4.19  . [place]; 1972  . CHOLECYSTECTOMY OPEN    . COLONOSCOPY  2006   RMR: 1. Internal hemorroids, otherwise normal rectum.  2. Pedunculated polyp at 35 cm. reomved with snare. The remainder of teh colonic mucosa appeared normal.   . COLONOSCOPY  2011   Dr. Gala Romney: multiple ascending colon polyps and rectal polyp, adenomatous  . COLONOSCOPY N/A 05/31/2014   Procedure: COLONOSCOPY;  Surgeon: Daneil Dolin, MD;  Location: [facility];  Service: Endoscopy;  Laterality: N/A;  130  . COLONOSCOPY WITH PROPOFOL N/A 06/24/2017   Procedure: COLONOSCOPY WITH PROPOFOL;  Surgeon: Daneil Dolin, MD;  Location: [facility];  Service: Endoscopy;  Laterality: N/A;  2:15pm  . ESOPHAGOGASTRODUODENOSCOPY  2006   Dr. Gala Romney: Subtle Schatzkis ring, otherwise normal upper GI tract, aside from a small pyloric channell erosion, status post dilation as described above.   Marland Kitchen FLEXIBLE SIGMOIDOSCOPY N/A  11/11/2017   Procedure: FLEXIBLE SIGMOIDOSCOPY;  Surgeon: Otis Brace, MD;  Location: [facility];  Service: Gastroenterology;  Laterality: N/A;  . JOINT REPLACEMENT    . LUMBAR DISC SURGERY  X 2  . POLYPECTOMY  06/24/2017   Procedure: POLYPECTOMY;  Surgeon: Daneil Dolin, MD;  Location: [facility];  Service: Endoscopy;;  ascending  . POSTERIOR LUMBAR FUSION  ? date 1st fusion ; 07/2006   previous L4-5 Ray threaded fusion cage; Exploration of L4-5 fusion & PLIF 07/22/2006/notes 07/22/2006  . TONSILLECTOMY    . TOTAL KNEE ARTHROPLASTY Right 05/24/2013   Procedure: TOTAL KNEE ARTHROPLASTY;  Surgeon: [name], MD;  Location: [facility];  Service: Orthopedics;  Laterality: Right;  . TOTAL KNEE ARTHROPLASTY Left   . VAGINAL HYSTERECTOMY      There were no vitals filed for this visit.  Subjective Assessment - 12/06/18 1200    Subjective  Standing aide arrives tomorrow.  Went to [employer] this morning and had AFO trimmed back a bit; orthotist recommending an even wider shoe.    Patient is accompained by:  Family member   grand daughter   Pertinent History  left sided CVA 10/29/17, Chronic CHF, AFib, COPD, Chronic Kidney Disease stage III, hx of bil TKA, Lumbar surgery    Patient Stated Goals  move around better in the home and improve function    Currently in Pain?  No/denies                       [facility] Adult PT Treatment/Exercise - 12/06/18 1202      Transfers   Transfers  Sit to Stand;Stand to Sit    Sit to Stand  4: Min assist    Sit to Stand Details (indicate cue type and reason)  assistance to support RUE.  Wearing shoe and AFO    Stand to Sit  4: Min assist    Stand to Sit Details  Assistance to support RUE and cues to sit hips back and lean trunk forward for controlled descent.      Transfer via Hydrologist  Demonstrated full sequence of set up and transfer from w/c <> mat with Stedy and then had pt's daughter return demonstrate with  min cues from therapist to attend to placement and support of RUE.  Then had daughter verbalize and demonstrate sequence back therapist as if she was teaching patient's husband at home.  Daughter asking if they can use sling for RUE at home during transfers to support and keep RUE close to body to protect it during transfers with husband.      Therapeutic Activites    Therapeutic Activities  ADL's;Other Therapeutic  Activities    ADL's  Demonstrated how to don foot in AFO and don shoe with use of shoe horn to daughter and had daughter return demonstrate with min-mod verbal and visual cues.      Other Therapeutic Activities  Attempted to find same style and size shoe on website but in [place] (extra wide) but website does not carry EEE single shoe; does have 4E in single shoe.  Pt to wear AFO in shoe for a couple hours today and assess fit and tolerance to wear.  Printed off picture and description of shoe in case they need to order larger width.               PT Education - 12/06/18 1214    Education Details  see TA; transfers with [employer]) Educated  Patient;Child(ren)    Methods  Explanation;Demonstration;Handout    Comprehension  Verbalized understanding;Returned demonstration       PT Short Term Goals - 11/28/18 1442      PT SHORT TERM GOAL #1   Title  Pt and family will be independent with supine, seated and standing HEP    Time  4    Status  On-going    Target Date  11/19/18      PT SHORT TERM GOAL #2   Title  Family will demonstrate safe use of Stedy for transfers at home    Time  4    Period  Weeks    Status  On-going    Target Date  11/19/18      PT SHORT TERM GOAL #3   Title  Pt will ambulate x 25' with RW with hand orthosis, trial AFO and mod-max A of one therapist    Time  4    Period  Weeks    Status  Achieved    Target Date  11/19/18        PT Long Term Goals - 10/20/18 1725      PT LONG TERM GOAL #1   Title  Patient and family will be independent  with final HEP recommendations    Time  8    Period  Weeks    Status  On-going    Target Date  12/19/18      PT LONG TERM GOAL #2   Title  Patient will perform sit <> stand from wheelchair with min A and transfer stand pivot with RW and AFO with Mod A of one person    Baseline  Min A to stand, not transferring with RW yet    Time  8    Period  Weeks    Status  Partially Met    Target Date  12/19/18      PT LONG TERM GOAL #3   Title  Pt will perform bed mobility on flat mat with min A    Time  8    Period  Weeks    Status  On-going    Target Date  12/19/18      PT LONG TERM GOAL #4   Title  Family will demonstrate independent ability to don appropriate AFO with R shoe    Time  8    Period  Weeks    Status  On-going    Target Date  12/19/18      PT LONG TERM GOAL #5   Title  Patient will ambulate 40 feet or greater with min A, AFO, and LRAD to safely navigate around home.  Time  8    Period  Weeks    Status  On-going    Target Date  12/19/18            Plan - 12/06/18 1215    Clinical Impression Statement  Continued to assess fit of trimmed AFO in shoe and teach daughter how to don shoe with use of shoe horn; will likely be too difficult for husband to perform at home and may need to look into a 4E shoe.  Initiated transfer and use of Stedy training with pt and daughter; daughter able to return demonstrate safe technique.  Will continue training with Stedy as needed but will begin to transition to stand pivot transfers with RW and AFO and gait training with addition of visits to continue to progress functional mobility independence and decrease burden of care. Will also continue to pursue power mobility for patient's independent mobility.  Pt is making good progress now that BP has stabilized and pt is better able to consistently participate in PT.    Personal Factors and Comorbidities  Age;Comorbidity 3+;Time since onset of injury/illness/exacerbation     Examination-Activity Limitations  Lift;Dressing;Self Feeding;Transfers;Toileting;Stand;Locomotion Level;Reach Overhead;Bed Mobility    Rehab Potential  Fair    PT Frequency  2x / week    PT Duration  8 weeks    PT Treatment/Interventions  ADLs/Self Care Home Management;Electrical Stimulation;Gait training;Stair training;Functional mobility training;Balance training;Therapeutic exercise;Therapeutic activities;Neuromuscular re-education;Wheelchair mobility training;Manual techniques;Passive range of motion;Patient/family education;DME Instruction;Energy conservation;Orthotic Fit/Training    PT Next Visit Plan  MONITOR BP EVERY SESSION WITH MANUAL CUFF ON LUE;  Did they add visits?  Did [place] send w/c order back?  need to order 4E shoe?  How are exercises going at home?  Training with family - donning AFO, standing aide, wearing schedule.  Stand pivot L and R with RW and AFO. gait with RW. Nustep with R hand orthosis. Seated mirror therapy for LE. Stedy transfers and sit <> stand on Stedy without UE support. Seated on large rockerboard or inverted BOSU performing lateral tilts or anterior/posterior for sit <> stand. Work on bed mobility on flat bed. 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 Acute CVA (cerebrovascular accident) ([place]) 10/01/2018  . Physical deconditioning 12/19/2017  . Chronic kidney disease (CKD), stage III (moderate)   . Hypoalbuminemia due to protein-calorie malnutrition ([place])   . Intractable migraine without status migrainosus   . Reactive depression   . Hemiparesis affecting right side as late effect of stroke ([place])   . Slow transit constipation   . PAF (paroxysmal atrial fibrillation) ([place])   . Dysphasia, post-stroke   . COPD (chronic obstructive pulmonary disease) ([place]) 10/29/2017  . Chronic diastolic CHF (congestive heart failure) ([place]) 10/29/2017  . Hypothyroidism 10/29/2017  . Essential hypertension 10/29/2017  . GERD (  gastroesophageal reflux disease) 10/29/2017  . Anxiety 10/29/2017  . 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 Hyperlipidemia 04/20/2013    Rico Junker, PT, DPT 12/06/18    3:05 PM    [facility] [address] [place], [place], [address] Phone: [phone]   Fax:  [phone]  Name: Holly Flores MRN: [id number] Date of Birth: [birth year]

## 2018-12-06 NOTE — Therapy (Signed)
[facility] [address] [place], [place], [address] Phone: [phone]   Fax:  [phone]  Occupational Therapy Treatment  Patient Details  Name: Holly Flores MRN: [id number] Date of Birth: 04-05-1945 Referring Provider (OT): Dr. Octavio Graves   Encounter Date: 12/06/2018  OT End of Session - 12/06/18 1256    Visit Number  13    Number of Visits  19    Date for OT Re-Evaluation  12/23/18    Authorization Type  M[employer] primary, [employer] secondary    Authorization - Visit Number  13    Authorization - Number of Visits  20    OT Start Time  1150    OT Stop Time  1235    OT Time Calculation (min)  45 min    Activity Tolerance  Patient tolerated treatment well    Behavior During Therapy  [facility] for tasks assessed/performed       Past Medical History:  Diagnosis Date  . Anxiety   . Arthritis    "all over" (12/20/2017)  . Central retinal vein occlusion of left eye 01/22/2015  . CHF (congestive heart failure) ([place])   . Chronic bronchitis ([place])    "get it most years" (12/20/2017)  . Chronic lower back pain   . CKD (chronic kidney disease), stage II   . COPD (chronic obstructive pulmonary disease) ([place])   . CVA (cerebral vascular accident) ([place]) 12/30/2014   OCULAR  LEFT  EYE   . Depression   . Fibromyalgia   . GERD (gastroesophageal reflux disease)   . Goiter   . History of colonic polyps 05/14/2014  . Hyperlipidemia   . Hypertension   . Hypothyroidism   . Kidney cysts    left side  . 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 Migraine    "couple times/wk; been having them for a long time" (12/20/2017)  . Myocardial infarction [facility])    "one dr said I did; one said I didn't;  don't remember when it was" (12/20/2017)  . 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 On home oxygen therapy    "3L; sleep w/it q night" (12/20/2017)  . Pneumonia    "several times" (12/20/2017)  . Shortness  of breath   . Sleep apnea    "stopped mask when I got on the oxygen" (12/20/2017)  . Stroke [facility])     Past Surgical History:  Procedure Laterality Date  . ANTERIOR CERVICAL DECOMP/DISCECTOMY FUSION    . BACK SURGERY    . BREAST BIOPSY     left-non cancerous  . CARDIAC CATHETERIZATION    . CARPAL TUNNEL RELEASE Left   . CATARACT EXTRACTION W/PHACO  09/21/2011   Procedure: CATARACT EXTRACTION PHACO AND INTRAOCULAR LENS PLACEMENT (IOC);  Surgeon: Williams Che, MD;  Location: [facility];  Service: Ophthalmology;  Laterality: Left;  CDE:9.78  . CATARACT EXTRACTION W/PHACO Right 10/15/2014   Procedure: CATARACT EXTRACTION PHACO AND INTRAOCULAR LENS PLACEMENT ([place]);  Surgeon: Williams Che, MD;  Location: [facility];  Service: Ophthalmology;  Laterality: Right;  CDE:4.19  . [place]; 1972  . CHOLECYSTECTOMY OPEN    . COLONOSCOPY  2006   RMR: 1. Internal hemorroids, otherwise normal rectum. 2. Pedunculated polyp at 35 cm. reomved with snare. The remainder of teh colonic mucosa appeared normal.   . COLONOSCOPY  2011   Dr. Gala Romney: multiple ascending colon polyps and rectal polyp, adenomatous  . COLONOSCOPY N/A 05/31/2014  Procedure: COLONOSCOPY;  Surgeon: Daneil Dolin, MD;  [facility];  Service: Endoscopy;  Laterality: N/A;  130  . COLONOSCOPY WITH PROPOFOL N/A 06/24/2017   Procedure: COLONOSCOPY WITH PROPOFOL;  Surgeon: Daneil Dolin, MD;  [facility];  Service: Endoscopy;  Laterality: N/A;  2:15pm  . ESOPHAGOGASTRODUODENOSCOPY  2006   Dr. Gala Romney: Subtle Schatzkis ring, otherwise normal upper GI tract, aside from a small pyloric channell erosion, status post dilation as described above.   Marland Kitchen FLEXIBLE SIGMOIDOSCOPY N/A 11/11/2017   Procedure: FLEXIBLE SIGMOIDOSCOPY;  Surgeon: Otis Brace, MD;  Location: [facility];  Service: Gastroenterology;  Laterality: N/A;  . JOINT REPLACEMENT    . LUMBAR DISC SURGERY  X 2  . POLYPECTOMY  06/24/2017   Procedure:  POLYPECTOMY;  Surgeon: Daneil Dolin, MD;  [facility];  Service: Endoscopy;;  ascending  . POSTERIOR LUMBAR FUSION  ? date 1st fusion ; 07/2006   previous L4-5 Ray threaded fusion cage; Exploration of L4-5 fusion & PLIF 07/22/2006/notes 07/22/2006  . TONSILLECTOMY    . TOTAL KNEE ARTHROPLASTY Right 05/24/2013   Procedure: TOTAL KNEE ARTHROPLASTY;  Surgeon: [name], MD;  Location: [facility];  Service: Orthopedics;  Laterality: Right;  . TOTAL KNEE ARTHROPLASTY Left   . VAGINAL HYSTERECTOMY      There were no vitals filed for this visit.  Subjective Assessment - 12/06/18 1255    Subjective   This is better (re: bed mobility)    Pertinent History  CVA 10/2017. PMH: CHF, COPD, CKD (Stage II), fibromyalgia, depression. (Husband, who is primary caregiver, also has cancer)    Limitations  montior BP, obesity, needs DME to transfer (sliding board or stand assist)    Currently in Pain?  No/denies      Seated: worked on trunk control through LE dressing and simulated toilet hygiene. Pt able to doff Lt shoe and sock and don using foot stool to don sock using one handed technique. Pt did require assist to doff sock over heel. Pt also unhooked/hooked velcro on Rt shoe. Worked on lateral trunk flexion for simulated wiping.  Pt worked on bed mobility going sit to supine and supine to sit: Pt required min assist from sit to supine for Rt leg management and RUE support only. Pt needed assist to bend Rt leg up prior to rolling for supine to sit and assist to bring hemiplegic arm across chest, however pt did the rest of the movement herself w/ cueing. Practiced full transfer sit to and from supine x 3.  Daughter came at end of session to see 3rd trial.                        OT Short Term Goals - 11/22/18 1418      OT SHORT TERM GOAL #1   Title  Independent with HEP for RUE (Caregiver assisted and self ROM as able)    Time  4    Period  Weeks    Status  Achieved      OT  SHORT TERM GOAL #2   Title  Pt/family to verbalize understanding with A/E (rocker knife, adapted shoes, extended brush, LH sponge, etc) to increase independence with BADLS    Time  4    Period  Weeks    Status  Achieved      OT SHORT TERM GOAL #3   Title  Pt/family to verbalize understanding with use of stand assist (Steady)  with toileting and transfers to decrease burden of care on caregiver    Time  4    Period  Weeks    Status  Achieved      OT SHORT TERM GOAL #4   Title  Pt to demo sliding board transfers consistently w/ mod assist or less for level transfer from w/c to/from bed    Time  4    Period  Weeks    Status  Achieved        OT Long Term Goals - 12/06/18 1257      OT LONG TERM GOAL #1   Title  Pt to demo independence with HEP for Lt shoulder (once pt seen by ortho MD)    Time  8    Period  Weeks    Status  Deferred   Pt has not seen ortho MD yet     OT LONG TERM GOAL #2   Title  Pt to be min assist for UE bathing, and mod assist or less for UE dressing    Time  4    Period  Weeks    Status  Revised      OT LONG TERM GOAL #3   Title  Pt to cut food and brush hair w/ A/E independently    Time  8    Period  Weeks    Status  Partially Met   needs to obtain long handled/extended brush for independence for bruhsing hair     OT LONG TERM GOAL #4   Title  Pt to perform toilet transfer w/ mod assist using correct DME prn    Time  8    Period  Weeks    Status  Achieved      OT LONG TERM GOAL #5   Title  Pt to perform LE dressing at max assist, LE bathing at min assist w/ use of LH sponge    Time  4    Period  Weeks    Status  On-going      OT LONG TERM GOAL #6   Title  Pt to perform stand pivot transfer w/ close supervision only and demo standing ability for caregiver to perform clothes management for toileting and transfers to [facility]    Time  4    Period  Weeks    Status  Deferred   Pt will be getting steady assist lift for this     OT LONG TERM GOAL #7    Title  Pt to demo adequate trunk control to simulate wiping and LE dressing w/o LOB    Time  4    Period  Weeks    Status  New            Plan - 12/06/18 1257    Clinical Impression Statement  Pt with improvement in bed mobility with techniques used. Pt continues to show improvement in trunk control and endurance.    Occupational performance deficits (Please refer to evaluation for details):  ADL's;IADL's;Rest and Sleep    Body Structure / Function / Physical Skills  ADL;ROM;Vision;IADL;Edema;Body mechanics;Endurance;Mobility;Strength;Coordination;Obesity;UE functional use;Pain;Decreased knowledge of use of DME    Rehab Potential  Good    Comorbidities impacting occupational performance description:  obesity, CHF, COPD, Lt shoulder limitations    OT Frequency  2x / week    OT Duration  4 weeks    OT Treatment/Interventions  Self-care/ADL training;Therapeutic exercise;Functional Mobility Training;Neuromuscular education;Splinting;Manual Therapy;Therapeutic activities;DME and/or AE instruction;Aquatic Therapy;Passive range of motion;Patient/family education;Visual/perceptual remediation/compensation  Plan  work on steady for toileting if family present for education    Consulted and Agree with Plan of Care  Patient    Family Member Consulted  DAUGHTER       Patient will benefit from skilled therapeutic intervention in order to improve the following deficits and impairments:   Body Structure / Function / Physical Skills: ADL, ROM, Vision, IADL, Edema, Body mechanics, Endurance, Mobility, Strength, Coordination, Obesity, UE functional use, Pain, Decreased knowledge of use of DME       Visit Diagnosis: 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 Acute CVA  (cerebrovascular accident) ([place]) 10/01/2018  . Physical deconditioning 12/19/2017  . Chronic kidney disease (CKD), stage III (moderate)   . Hypoalbuminemia due to protein-calorie malnutrition ([place])   . Intractable migraine without status migrainosus   . Reactive depression   . Hemiparesis affecting right side as late effect of stroke ([place])   . Slow transit constipation   . PAF (paroxysmal atrial fibrillation) ([place])   . Dysphasia, post-stroke   . COPD (chronic obstructive pulmonary disease) ([place]) 10/29/2017  . Chronic diastolic CHF (congestive heart failure) ([place]) 10/29/2017  . Hypothyroidism 10/29/2017  . Essential hypertension 10/29/2017  . GERD (gastroesophageal reflux disease) 10/29/2017  . Anxiety 10/29/2017  . 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 Hyperlipidemia 04/20/2013    Carey Bullocks, OTR/L 12/06/2018, 1:00 PM  [facility] [address] [place], [place], [address] Phone: [phone]   Fax:  [phone]  Name: Holly Flores MRN: [id number] Date of Birth: Jul 11, 1945

## 2018-12-09 ENCOUNTER — Encounter

## 2018-12-12 ENCOUNTER — Other Ambulatory Visit: Payer: Self-pay

## 2018-12-12 ENCOUNTER — Encounter: Payer: Self-pay | Admitting: Physical Therapy

## 2018-12-12 ENCOUNTER — Ambulatory Visit: Payer: Medicare Other | Admitting: Physical Therapy

## 2018-12-12 VITALS — BP 130/80

## 2018-12-12 DIAGNOSIS — R29818 Other symptoms and signs involving the nervous system: Secondary | ICD-10-CM

## 2018-12-12 DIAGNOSIS — R293 Abnormal posture: Secondary | ICD-10-CM

## 2018-12-12 DIAGNOSIS — I69351 Hemiplegia and hemiparesis following cerebral infarction affecting right dominant side: Secondary | ICD-10-CM | POA: Diagnosis not present

## 2018-12-12 DIAGNOSIS — R262 Difficulty in walking, not elsewhere classified: Secondary | ICD-10-CM

## 2018-12-12 DIAGNOSIS — M6281 Muscle weakness (generalized): Secondary | ICD-10-CM

## 2018-12-12 DIAGNOSIS — R2689 Other abnormalities of gait and mobility: Secondary | ICD-10-CM

## 2018-12-13 NOTE — Therapy (Signed)
[facility] [address] [place], [place], [address] Phone: [phone]   Fax:  [phone]  Physical Therapy Treatment  Patient Details  Name: Holly Flores MRN: [id number] Date of Birth: 04/30/1945 Referring Provider (PT): Drue Second, FNP   Encounter Date: 12/12/2018  PT End of Session - 12/13/18 0945    Visit Number  31    Number of Visits  37    Date for PT Re-Evaluation  12/19/18    Authorization Type  Progress note every 10th visit; KX modifier at 15th visit.    PT Start Time  1532    PT Stop Time  1613    PT Time Calculation (min)  41 min    Equipment Utilized During Treatment  Other (comment)   stedy   Activity Tolerance  Patient limited by fatigue   feeling hot/dizzy during standing with Stedy - vitals WNL   Behavior During Therapy  WFL for tasks assessed/performed       Past Medical History:  Diagnosis Date  . Anxiety   . Arthritis    "all over" (12/20/2017)  . Central retinal vein occlusion of left eye 01/22/2015  . CHF (congestive heart failure) ([place])   . Chronic bronchitis ([place])    "get it most years" (12/20/2017)  . Chronic lower back pain   . CKD (chronic kidney disease), stage II   . COPD (chronic obstructive pulmonary disease) ([place])   . CVA (cerebral vascular accident) ([place]) 12/30/2014   OCULAR  LEFT  EYE   . Depression   . Fibromyalgia   . GERD (gastroesophageal reflux disease)   . Goiter   . History of colonic polyps 05/14/2014  . Hyperlipidemia   . Hypertension   . Hypothyroidism   . Kidney cysts    left side  . 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 Migraine    "couple times/wk; been having them for a long time" (12/20/2017)  . Myocardial infarction [facility])    "one dr said I did; one said I didn't;  don't remember when it was" (12/20/2017)  . 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 On home oxygen therapy    "3L; sleep w/it q night"  (12/20/2017)  . Pneumonia    "several times" (12/20/2017)  . Shortness of breath   . Sleep apnea    "stopped mask when I got on the oxygen" (12/20/2017)  . Stroke [facility])     Past Surgical History:  Procedure Laterality Date  . ANTERIOR CERVICAL DECOMP/DISCECTOMY FUSION    . BACK SURGERY    . BREAST BIOPSY     left-non cancerous  . CARDIAC CATHETERIZATION    . CARPAL TUNNEL RELEASE Left   . CATARACT EXTRACTION W/PHACO  09/21/2011   Procedure: CATARACT EXTRACTION PHACO AND INTRAOCULAR LENS PLACEMENT (IOC);  Surgeon: Williams Che, MD;  Location: [facility];  Service: Ophthalmology;  Laterality: Left;  CDE:9.78  . CATARACT EXTRACTION W/PHACO Right 10/15/2014   Procedure: CATARACT EXTRACTION PHACO AND INTRAOCULAR LENS PLACEMENT ([place]);  Surgeon: Williams Che, MD;  Location: [facility];  Service: Ophthalmology;  Laterality: Right;  CDE:4.19  . [place]; 1972  . CHOLECYSTECTOMY OPEN    . COLONOSCOPY  2006   RMR: 1. Internal hemorroids, otherwise normal rectum. 2. Pedunculated polyp at 35 cm. reomved with snare. The remainder of teh colonic mucosa appeared normal.   . COLONOSCOPY  2011   Dr. Gala Romney: multiple ascending colon  polyps and rectal polyp, adenomatous  . COLONOSCOPY N/A 05/31/2014   Procedure: COLONOSCOPY;  Surgeon: Daneil Dolin, MD;  Location: [facility];  Service: Endoscopy;  Laterality: N/A;  130  . COLONOSCOPY WITH PROPOFOL N/A 06/24/2017   Procedure: COLONOSCOPY WITH PROPOFOL;  Surgeon: Daneil Dolin, MD;  [facility];  Service: Endoscopy;  Laterality: N/A;  2:15pm  . ESOPHAGOGASTRODUODENOSCOPY  2006   Dr. Gala Romney: Subtle Schatzkis ring, otherwise normal upper GI tract, aside from a small pyloric Flores erosion, status post dilation as described above.   Marland Kitchen FLEXIBLE SIGMOIDOSCOPY N/A 11/11/2017   Procedure: FLEXIBLE SIGMOIDOSCOPY;  Surgeon: Otis Brace, MD;  Location: [facility];  Service: Gastroenterology;  Laterality: N/A;  . JOINT REPLACEMENT     . LUMBAR DISC SURGERY  X 2  . POLYPECTOMY  06/24/2017   Procedure: POLYPECTOMY;  Surgeon: Daneil Dolin, MD;  Location: [facility];  Service: Endoscopy;;  ascending  . POSTERIOR LUMBAR FUSION  ? date 1st fusion ; 07/2006   previous L4-5 Ray threaded fusion cage; Exploration of L4-5 fusion & PLIF 07/22/2006/notes 07/22/2006  . TONSILLECTOMY    . TOTAL KNEE ARTHROPLASTY Right 05/24/2013   Procedure: TOTAL KNEE ARTHROPLASTY;  Surgeon: [name], MD;  Location: [facility];  Service: Orthopedics;  Laterality: Right;  . TOTAL KNEE ARTHROPLASTY Left   . VAGINAL HYSTERECTOMY      Vitals:   12/12/18 1545  BP: 130/80    Subjective Assessment - 12/12/18 1537    Subjective  States that she went to the doctor earlier today and her BP was 128/70. Notices that she does have some dizziness when she is rolling in bed, states that the room is spinning and goes away a couple seconds later. Has been going on for about a month and it has gotten worse. Not an everyday thing. States that her AFO has been cut but didn't fit the shoe right, still did not order a 4E. The standing aide was supposed to be delivered last Wednesday and it was not yet delivered, should be here this Wednesday.    Patient is accompained by:  Family member   grand daughter   Pertinent History  left sided CVA 10/29/17, Chronic CHF, AFib, COPD, Chronic Kidney Disease stage III, hx of bil TKA, Lumbar surgery    Patient Stated Goals  move around better in the home and improve function    Currently in Pain?  No/denies                    [facility] Adult PT Treatment/Exercise - 12/13/18 0001      Transfers   Transfers  Sit to Stand;Stand to Sit    Sit to Stand  4: Min assist;2: Max assist    Sit to Stand Details (indicate cue type and reason)  initially trialed sit <> stand from pt's w/c with Stedy, multiple attempts with pt attemping pulling to stand with LUE on Stedy and max A from therapist, pt unable to perform even after  therapist assisted pt in scooting forward in wheelchair. Pt reports that she has increased LUE discomfort due to primarily using it for transfers. Once pt got to higher mat table able to stand with min A for RUE with no issue. In standing practice 5 reps of sit <> stands from [place] with mirror as visual cue for midline orientation. After performing 5 reps with helping use LUE to pull to stand, pt reporting feeling dizzy and needing to sit  back down. transferred pt back to w/c, BP WNL.      Stand to Sit  4: Min assist    Stand to Sit Details  to support RUE    Transfer via Hydrologist  Squat pivot transfer from w/c to level blue mat table: mod A, cues for head hips relationship and weight shifting.          Access Code: [email or web address]    URL: [email or web address]    Date: 12/01/2018  Prepared by: Misty Stanley   Reviewed exercises for pt's HEP - pt reports variable participation at home due to not always having help from her family.   Exercises Seated Long Arc Quad - 10 reps - 2 sets - 1x daily - 7x weekly - pt performing through her available range 1 x 10 reps  Seated Knee Flexion - 10 reps - 2 sets - 1x daily - 7x weekly - with towel on floor, max A from therapist kicking foot back in 1 x 10 reps  Seated Hip Adduction Squeeze with Ball - 10 reps - 2 sets - 1x daily - 7x weekly - with pillow 2 x 10 reps  Seated Hip Flexion - 10 reps - 2 sets - 1x daily - 7x weekly - attempted on L, 1 x 5 reps, 1 x 10 reps hip flexion on R with cues for pt to push down LLE into floor for weight bearing, min A from therapist.       PT Education - 12/13/18 [place]    Education Details  purchasing a 4E wide shoe - educated pt as well as family member Chief Strategy Officer at end of [employer]) Educated  Patient;Child(ren)    Methods  Explanation    Comprehension  Verbalized understanding       PT Short Term Goals - 11/28/18 1442      PT SHORT TERM GOAL #1   Title  Pt and  family will be independent with supine, seated and standing HEP    Time  4    Status  On-going    Target Date  11/19/18      PT SHORT TERM GOAL #2   Title  Family will demonstrate safe use of Stedy for transfers at home    Time  4    Period  Weeks    Status  On-going    Target Date  11/19/18      PT SHORT TERM GOAL #3   Title  Pt will ambulate x 25' with RW with hand orthosis, trial AFO and mod-max A of one therapist    Time  4    Period  Weeks    Status  Achieved    Target Date  11/19/18        PT Long Term Goals - 10/20/18 1725      PT LONG TERM GOAL #1   Title  Patient and family will be independent with final HEP recommendations    Time  8    Period  Weeks    Status  On-going    Target Date  12/19/18      PT LONG TERM GOAL #2   Title  Patient will perform sit <> stand from wheelchair with min A and transfer stand pivot with RW and AFO with Mod A of one person    Baseline  Min A to stand, not transferring with RW yet    Time  8  Period  Weeks    Status  Partially Met    Target Date  12/19/18      PT LONG TERM GOAL #3   Title  Pt will perform bed mobility on flat mat with min A    Time  8    Period  Weeks    Status  On-going    Target Date  12/19/18      PT LONG TERM GOAL #4   Title  Family will demonstrate independent ability to don appropriate AFO with R shoe    Time  8    Period  Weeks    Status  On-going    Target Date  12/19/18      PT LONG TERM GOAL #5   Title  Patient will ambulate 40 feet or greater with min A, AFO, and LRAD to safely navigate around home.    Time  8    Period  Weeks    Status  On-going    Target Date  12/19/18            Plan - 12/13/18 1001    Clinical Impression Statement  Pt did not bring in her AFO to today's session - unable to practice gait and stand pivot transfers with RW today. Further educated pt and son about needing to buy a 4E wide shoe (based on orthotist's and primary PT recommendation). Pt with increased  fatigue today of LUE - unable to perform Stedy transfer today from w/c, was able to perform from higher mat table with min A after squat pivot transfer. Pt reporting dizziness during transfer today and during sit <> stands, reports she was diagnosed with vertigo during her PCP appointment today. States that it goes away after a couple of seconds. After 5 sit <> stands pt reporting needing to sit down due to vertigo, however BP WFL and throughout session. Will continue to progress towards LTGs.    Personal Factors and Comorbidities  Age;Comorbidity 3+;Time since onset of injury/illness/exacerbation    Examination-Activity Limitations  Lift;Dressing;Self Feeding;Transfers;Toileting;Stand;Locomotion Level;Reach Overhead;Bed Mobility    Rehab Potential  Fair    PT Frequency  2x / week    PT Duration  8 weeks    PT Treatment/Interventions  ADLs/Self Care Home Management;Electrical Stimulation;Gait training;Stair training;Functional mobility training;Balance training;Therapeutic exercise;Therapeutic activities;Neuromuscular re-education;Wheelchair mobility training;Manual techniques;Passive range of motion;Patient/family education;DME Instruction;Energy conservation;Orthotic Fit/Training    PT Next Visit Plan  MONITOR BP EVERY SESSION WITH MANUAL CUFF ON LUE;  needs more appointments - scheduled out through first week of Nov on same days/times as OT (wants to know if they are done with OT before scheduling more), assess for dizziness, Did [place] send w/c order back?  need to order 4E shoe?  How are exercises going at home?  Training with family - donning AFO, standing aide, wearing schedule.  Stand pivot L and R with RW and AFO. gait with RW. Nustep with R hand orthosis. Seated mirror therapy for LE. Stedy transfers and sit <> stand on Stedy without UE support. Seated on large rockerboard or inverted BOSU performing lateral tilts or anterior/posterior for sit <> stand. Work on bed mobility on flat bed. Add to  HEP: WB exercises for mm activation    Consulted and Agree with Plan of Care  Patient       Patient will benefit from skilled therapeutic intervention in order to improve the following deficits and impairments:  Pain, Postural dysfunction, Impaired tone, Decreased mobility, Decreased activity tolerance, Decreased range of  motion, Decreased strength, Impaired UE functional use, Difficulty walking, Decreased balance, Abnormal gait, Decreased endurance  Visit Diagnosis: Muscle weakness (generalized)  Other symptoms and signs involving the nervous system  Abnormal posture  Difficulty in walking, not elsewhere classified  Other abnormalities of gait and mobility     Problem List Patient Active Problem List   Diagnosis Date Noted  . Acute CVA (cerebrovascular accident) ([place]) 10/01/2018  . Physical deconditioning 12/19/2017  . Chronic kidney disease (CKD), stage III (moderate)   . Hypoalbuminemia due to protein-calorie malnutrition ([place])   . Intractable migraine without status migrainosus   . Reactive depression   . Hemiparesis affecting right side as late effect of stroke ([place])   . Slow transit constipation   . PAF (paroxysmal atrial fibrillation) ([place])   . Dysphasia, post-stroke   . COPD (chronic obstructive pulmonary disease) ([place]) 10/29/2017  . Chronic diastolic CHF (congestive heart failure) ([place]) 10/29/2017  . Hypothyroidism 10/29/2017  . Essential hypertension 10/29/2017  . GERD (gastroesophageal reflux disease) 10/29/2017  . Anxiety 10/29/2017  . 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 Hyperlipidemia 04/20/2013    Arliss Journey, PT, DPT  12/13/2018, 10:05 AM  [facility] [address] [place], [place], [address] Phone: [phone]   Fax:  [phone]  Name: Holly Flores MRN: [id number] Date of Birth: 04-05-1945

## 2018-12-15 ENCOUNTER — Ambulatory Visit: Payer: Medicare Other | Admitting: Occupational Therapy

## 2018-12-15 ENCOUNTER — Ambulatory Visit: Payer: Medicare Other | Admitting: Physical Therapy

## 2018-12-20 ENCOUNTER — Other Ambulatory Visit: Payer: Self-pay

## 2018-12-20 ENCOUNTER — Ambulatory Visit: Payer: Medicare Other | Attending: *Deleted | Admitting: Occupational Therapy

## 2018-12-20 ENCOUNTER — Ambulatory Visit: Payer: Medicare Other | Admitting: Physical Therapy

## 2018-12-20 DIAGNOSIS — R262 Difficulty in walking, not elsewhere classified: Secondary | ICD-10-CM

## 2018-12-20 DIAGNOSIS — R29818 Other symptoms and signs involving the nervous system: Secondary | ICD-10-CM | POA: Insufficient documentation

## 2018-12-20 DIAGNOSIS — R293 Abnormal posture: Secondary | ICD-10-CM | POA: Insufficient documentation

## 2018-12-20 DIAGNOSIS — M6281 Muscle weakness (generalized): Secondary | ICD-10-CM

## 2018-12-20 DIAGNOSIS — I69351 Hemiplegia and hemiparesis following cerebral infarction affecting right dominant side: Secondary | ICD-10-CM | POA: Diagnosis present

## 2018-12-20 DIAGNOSIS — R2681 Unsteadiness on feet: Secondary | ICD-10-CM | POA: Diagnosis present

## 2018-12-20 DIAGNOSIS — R2689 Other abnormalities of gait and mobility: Secondary | ICD-10-CM | POA: Insufficient documentation

## 2018-12-20 NOTE — Therapy (Signed)
[facility] [address] [place], [place], [address] Phone: [phone]   Fax:  [phone]  Occupational Therapy Treatment  Patient Details  Name: Holly Flores MRN: [id number] Date of Birth: [birth year] Referring Provider (OT): Dr. Octavio Graves   Encounter Date: 12/20/2018  OT End of Session - 12/20/18 1521    Visit Number  14    Number of Visits  19    Date for OT Re-Evaluation  12/23/18    Authorization Type  M[employer] primary, [employer] secondary    Authorization - Visit Number  14    Authorization - Number of Visits  20    OT Start Time  1105    OT Stop Time  1145    OT Time Calculation (min)  40 min    Activity Tolerance  Patient tolerated treatment well    Behavior During Therapy  [facility] for tasks assessed/performed       Past Medical History:  Diagnosis Date  . Anxiety   . Arthritis    "all over" (12/20/2017)  . Central retinal vein occlusion of left eye 01/22/2015  . CHF (congestive heart failure) ([place])   . Chronic bronchitis ([place])    "get it most years" (12/20/2017)  . Chronic lower back pain   . CKD (chronic kidney disease), stage II   . COPD (chronic obstructive pulmonary disease) ([place])   . CVA (cerebral vascular accident) ([place]) 12/30/2014   OCULAR  LEFT  EYE   . Depression   . Fibromyalgia   . GERD (gastroesophageal reflux disease)   . Goiter   . History of colonic polyps 05/14/2014  . Hyperlipidemia   . Hypertension   . Hypothyroidism   . Kidney cysts    left side  . 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 Migraine    "couple times/wk; been having them for a long time" (12/20/2017)  . Myocardial infarction [facility])    "one dr said I did; one said I didn't;  don't remember when it was" (12/20/2017)  . 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 On home oxygen therapy    "3L; sleep w/it q night" (12/20/2017)  . Pneumonia    "several times" (12/20/2017)  . Shortness  of breath   . Sleep apnea    "stopped mask when I got on the oxygen" (12/20/2017)  . Stroke [facility])     Past Surgical History:  Procedure Laterality Date  . ANTERIOR CERVICAL DECOMP/DISCECTOMY FUSION    . BACK SURGERY    . BREAST BIOPSY     left-non cancerous  . CARDIAC CATHETERIZATION    . CARPAL TUNNEL RELEASE Left   . CATARACT EXTRACTION W/PHACO  09/21/2011   Procedure: CATARACT EXTRACTION PHACO AND INTRAOCULAR LENS PLACEMENT (IOC);  Surgeon: Williams Che, MD;  Location: [facility];  Service: Ophthalmology;  Laterality: Left;  CDE:9.78  . CATARACT EXTRACTION W/PHACO Right 10/15/2014   Procedure: CATARACT EXTRACTION PHACO AND INTRAOCULAR LENS PLACEMENT ([place]);  Surgeon: Williams Che, MD;  Location: [facility];  Service: Ophthalmology;  Laterality: Right;  CDE:4.19  . [place]; 1972  . CHOLECYSTECTOMY OPEN    . COLONOSCOPY  2006   RMR: 1. Internal hemorroids, otherwise normal rectum. 2. Pedunculated polyp at 35 cm. reomved with snare. The remainder of teh colonic mucosa appeared normal.   . COLONOSCOPY  2011   Dr. Gala Romney: multiple ascending colon polyps and rectal polyp, adenomatous  . COLONOSCOPY N/A 05/31/2014  Procedure: COLONOSCOPY;  Surgeon: Daneil Dolin, MD;  [facility];  Service: Endoscopy;  Laterality: N/A;  130  . COLONOSCOPY WITH PROPOFOL N/A 06/24/2017   Procedure: COLONOSCOPY WITH PROPOFOL;  Surgeon: Daneil Dolin, MD;  [facility];  Service: Endoscopy;  Laterality: N/A;  2:15pm  . ESOPHAGOGASTRODUODENOSCOPY  2006   Dr. Gala Romney: Subtle Schatzkis ring, otherwise normal upper GI tract, aside from a small pyloric channell erosion, status post dilation as described above.   Marland Kitchen FLEXIBLE SIGMOIDOSCOPY N/A 11/11/2017   Procedure: FLEXIBLE SIGMOIDOSCOPY;  Surgeon: Otis Brace, MD;  Location: [facility];  Service: Gastroenterology;  Laterality: N/A;  . JOINT REPLACEMENT    . LUMBAR DISC SURGERY  X 2  . POLYPECTOMY  06/24/2017   Procedure:  POLYPECTOMY;  Surgeon: Daneil Dolin, MD;  [facility];  Service: Endoscopy;;  ascending  . POSTERIOR LUMBAR FUSION  ? date 1st fusion ; 07/2006   previous L4-5 Ray threaded fusion cage; Exploration of L4-5 fusion & PLIF 07/22/2006/notes 07/22/2006  . TONSILLECTOMY    . TOTAL KNEE ARTHROPLASTY Right 05/24/2013   Procedure: TOTAL KNEE ARTHROPLASTY;  Surgeon: [name], MD;  Location: [facility];  Service: Orthopedics;  Laterality: Right;  . TOTAL KNEE ARTHROPLASTY Left   . VAGINAL HYSTERECTOMY      There were no vitals filed for this visit.  Subjective Assessment - 12/20/18 1107    Subjective   I don't think I'm getting the Steady now b/c insurance isn't paying for it. My Rt leg also feels weaker    Pertinent History  CVA 10/2017. PMH: CHF, COPD, CKD (Stage II), fibromyalgia, depression. (Husband, who is primary caregiver, also has cancer)    Limitations  montior BP, obesity, needs DME to transfer (sliding board or stand assist)    Currently in Pain?  No/denies       Pt reports bed mobility has gotten better and she can now do herself.  Pt reports Rt LE appears weaker.  Sit to stand at sink - pt able to stand for up to 2 minutes however was not able to place weight through RLE.  Dynamic sitting for trunk control - pt able to lean over on Lt elbow and pull herself up to center I'ly. Pt able to place wt over Rt elbow and pull up w/ mod support at shoulder and min assist. Pt also coming from semi-reclined to upright position I'ly. Pt does well with squat pivot transfer w/ mod assist, however unable to perform stand pivot transfer.  Pt is only scheduled for 1 more visit - will either need to d/c or add 1 more week of O.T.                       OT Short Term Goals - 11/22/18 1418      OT SHORT TERM GOAL #1   Title  Independent with HEP for RUE (Caregiver assisted and self ROM as able)    Time  4    Period  Weeks    Status  Achieved      OT SHORT TERM GOAL #2    Title  Pt/family to verbalize understanding with A/E (rocker knife, adapted shoes, extended brush, LH sponge, etc) to increase independence with BADLS    Time  4    Period  Weeks    Status  Achieved      OT SHORT TERM GOAL #3   Title  Pt/family to verbalize  understanding with use of stand assist (Steady) with toileting and transfers to decrease burden of care on caregiver    Time  4    Period  Weeks    Status  Achieved      OT SHORT TERM GOAL #4   Title  Pt to demo sliding board transfers consistently w/ mod assist or less for level transfer from w/c to/from bed    Time  4    Period  Weeks    Status  Achieved        OT Long Term Goals - 12/06/18 1257      OT LONG TERM GOAL #1   Title  Pt to demo independence with HEP for Lt shoulder (once pt seen by ortho MD)    Time  8    Period  Weeks    Status  Deferred   Pt has not seen ortho MD yet     OT LONG TERM GOAL #2   Title  Pt to be min assist for UE bathing, and mod assist or less for UE dressing    Time  4    Period  Weeks    Status  Revised      OT LONG TERM GOAL #3   Title  Pt to cut food and brush hair w/ A/E independently    Time  8    Period  Weeks    Status  Partially Met   needs to obtain long handled/extended brush for independence for bruhsing hair     OT LONG TERM GOAL #4   Title  Pt to perform toilet transfer w/ mod assist using correct DME prn    Time  8    Period  Weeks    Status  Achieved      OT LONG TERM GOAL #5   Title  Pt to perform LE dressing at max assist, LE bathing at min assist w/ use of LH sponge    Time  4    Period  Weeks    Status  On-going      OT LONG TERM GOAL #6   Title  Pt to perform stand pivot transfer w/ close supervision only and demo standing ability for caregiver to perform clothes management for toileting and transfers to [facility]    Time  4    Period  Weeks    Status  Deferred   Pt will be getting steady assist lift for this     OT LONG TERM GOAL #7   Title  Pt to demo  adequate trunk control to simulate wiping and LE dressing w/o LOB    Time  4    Period  Weeks    Status  New            Plan - 12/20/18 1521    Clinical Impression Statement  Pt with increased trunk control for dynamic sitting and bed mobility. However limited with standing and stand pivot transfers.    Occupational performance deficits (Please refer to evaluation for details):  ADL's;IADL's;Rest and Sleep    Body Structure / Function / Physical Skills  ADL;ROM;Vision;IADL;Edema;Body mechanics;Endurance;Mobility;Strength;Coordination;Obesity;UE functional use;Pain;Decreased knowledge of use of DME    Rehab Potential  Good    Comorbidities impacting occupational performance description:  obesity, CHF, COPD, Lt shoulder limitations    OT Frequency  2x / week    OT Duration  4 weeks    OT Treatment/Interventions  Self-care/ADL training;Therapeutic exercise;Functional Mobility Training;Neuromuscular education;Splinting;Manual Therapy;Therapeutic activities;DME and/or AE  instruction;Aquatic Therapy;Passive range of motion;Patient/family education;Visual/perceptual remediation/compensation    Plan  ? 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 Acute CVA (cerebrovascular accident) ([place]) 10/01/2018  . Physical deconditioning 12/19/2017  . Chronic  kidney disease (CKD), stage III (moderate)   . Hypoalbuminemia due to protein-calorie malnutrition ([place])   . Intractable migraine without status migrainosus   . Reactive depression   . Hemiparesis affecting right side as late effect of stroke ([place])   . Slow transit constipation   . PAF (paroxysmal atrial fibrillation) ([place])   . Dysphasia, post-stroke   . COPD (chronic obstructive pulmonary disease) ([place]) 10/29/2017  . Chronic diastolic CHF (congestive heart failure) ([place]) 10/29/2017  . Hypothyroidism 10/29/2017  . Essential hypertension 10/29/2017  . GERD (gastroesophageal reflux disease) 10/29/2017  . Anxiety 10/29/2017  . 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 Hyperlipidemia 04/20/2013    Carey Bullocks, OTR/L 12/20/2018, 3:23 PM  [facility] [address] [place], [place], [address] Phone: [phone]   Fax:  [phone]  Name: Holly Flores MRN: [id number] Date of Birth: 11/21/1945

## 2018-12-20 NOTE — Therapy (Signed)
[facility] [address] [place], [place], [address] Phone: [phone]   Fax:  [phone]  Physical Therapy Treatment  Patient Details  Name: Holly Flores MRN: [id number] Date of Birth: 1945-07-22 Referring Provider (PT): Drue Second, FNP   Encounter Date: 12/20/2018  PT End of Session - 12/20/18 1346    Visit Number  32    Number of Visits  37    Date for PT Re-Evaluation  12/19/18    Authorization Type  Progress note every 10th visit; KX modifier at 15th visit.    PT Start Time  1145    PT Stop Time  1215    PT Time Calculation (min)  30 min    Equipment Utilized During Treatment  Other (comment)   stedy   Activity Tolerance  Patient limited by fatigue   feeling hot/dizzy during standing with Stedy - vitals WNL   Behavior During Therapy  [facility] for tasks assessed/performed       Past Medical History:  Diagnosis Date  . Anxiety   . Arthritis    "all over" (12/20/2017)  . Central retinal vein occlusion of left eye 01/22/2015  . CHF (congestive heart failure) ([place])   . Chronic bronchitis ([place])    "get it most years" (12/20/2017)  . Chronic lower back pain   . CKD (chronic kidney disease), stage II   . COPD (chronic obstructive pulmonary disease) ([place])   . CVA (cerebral vascular accident) ([place]) 12/30/2014   OCULAR  LEFT  EYE   . Depression   . Fibromyalgia   . GERD (gastroesophageal reflux disease)   . Goiter   . History of colonic polyps 05/14/2014  . Hyperlipidemia   . Hypertension   . Hypothyroidism   . Kidney cysts    left side  . 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 Migraine    "couple times/wk; been having them for a long time" (12/20/2017)  . Myocardial infarction [facility])    "one dr said I did; one said I didn't;  don't remember when it was" (12/20/2017)  . 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 On home oxygen therapy    "3L; sleep w/it q night"  (12/20/2017)  . Pneumonia    "several times" (12/20/2017)  . Shortness of breath   . Sleep apnea    "stopped mask when I got on the oxygen" (12/20/2017)  . Stroke [facility])     Past Surgical History:  Procedure Laterality Date  . ANTERIOR CERVICAL DECOMP/DISCECTOMY FUSION    . BACK SURGERY    . BREAST BIOPSY     left-non cancerous  . CARDIAC CATHETERIZATION    . CARPAL TUNNEL RELEASE Left   . CATARACT EXTRACTION W/PHACO  09/21/2011   Procedure: CATARACT EXTRACTION PHACO AND INTRAOCULAR LENS PLACEMENT (IOC);  Surgeon: Williams Che, MD;  Location: [facility];  Service: Ophthalmology;  Laterality: Left;  CDE:9.78  . CATARACT EXTRACTION W/PHACO Right 10/15/2014   Procedure: CATARACT EXTRACTION PHACO AND INTRAOCULAR LENS PLACEMENT ([place]);  Surgeon: Williams Che, MD;  Location: [facility];  Service: Ophthalmology;  Laterality: Right;  CDE:4.19  . [place]; 1972  . CHOLECYSTECTOMY OPEN    . COLONOSCOPY  2006   RMR: 1. Internal hemorroids, otherwise normal rectum. 2. Pedunculated polyp at 35 cm. reomved with snare. The remainder of teh colonic mucosa appeared normal.   . COLONOSCOPY  2011   Dr. Gala Romney: multiple ascending colon  polyps and rectal polyp, adenomatous  . COLONOSCOPY N/A 05/31/2014   Procedure: COLONOSCOPY;  Surgeon: Daneil Dolin, MD;  Location: [facility];  Service: Endoscopy;  Laterality: N/A;  130  . COLONOSCOPY WITH PROPOFOL N/A 06/24/2017   Procedure: COLONOSCOPY WITH PROPOFOL;  Surgeon: Daneil Dolin, MD;  [facility];  Service: Endoscopy;  Laterality: N/A;  2:15pm  . ESOPHAGOGASTRODUODENOSCOPY  2006   Dr. Gala Romney: Subtle Schatzkis ring, otherwise normal upper GI tract, aside from a small pyloric channell erosion, status post dilation as described above.   Marland Kitchen FLEXIBLE SIGMOIDOSCOPY N/A 11/11/2017   Procedure: FLEXIBLE SIGMOIDOSCOPY;  Surgeon: Otis Brace, MD;  Location: [facility];  Service: Gastroenterology;  Laterality: N/A;  . JOINT REPLACEMENT     . LUMBAR DISC SURGERY  X 2  . POLYPECTOMY  06/24/2017   Procedure: POLYPECTOMY;  Surgeon: Daneil Dolin, MD;  [facility];  Service: Endoscopy;;  ascending  . POSTERIOR LUMBAR FUSION  ? date 1st fusion ; 07/2006   previous L4-5 Ray threaded fusion cage; Exploration of L4-5 fusion & PLIF 07/22/2006/notes 07/22/2006  . TONSILLECTOMY    . TOTAL KNEE ARTHROPLASTY Right 05/24/2013   Procedure: TOTAL KNEE ARTHROPLASTY;  Surgeon: [name], MD;  Location: [facility];  Service: Orthopedics;  Laterality: Right;  . TOTAL KNEE ARTHROPLASTY Left   . VAGINAL HYSTERECTOMY      There were no vitals filed for this visit.  Subjective Assessment - 12/20/18 1338    Subjective  She says she still has some dizziness, feels like her Rt leg is geting weaker, sometimes she can lift it sometimes she cant, Says someone called her and said insurance was not going to cover steady for her.    Patient is accompained by:  Family member   son   Pertinent History  left sided CVA 10/29/17, Chronic CHF, AFib, COPD, Chronic Kidney Disease stage III, hx of bil TKA, Lumbar surgery    Currently in Pain?  No/denies                       [facility] Adult PT Treatment/Exercise - 12/20/18 0001      Transfers   Transfers  Sit to Stand;Stand to Sit    Sit to Stand  4: Min guard;2: Max assist    Sit to Stand Details (indicate cue type and reason)  min A using steady then able to perform 10 mini squats in steady, then max A without steady    Stand to Sit  4: Min assist    Stand to Sit Details  to support RUE    Stand Pivot Transfers  3: Mod assist    Stand Pivot Transfer Details (indicate cue type and reason)  X 4 total reps    Lateral/Scoot Transfers  3: Mod assist;With slide board    Lateral/Scoot Transfer Details (indicate cue type and reason)  X2 total reps      Exercises   Other Exercises   LAQ and marches, and hip abd, hip add ball squeeze all 10 reps bilat and 3 lb weight on Lt side, needed min to mod  assistance on Rt side due to weakness and poor recuritment               PT Short Term Goals - 11/28/18 1442      PT SHORT TERM GOAL #1   Title  Pt and family will be independent with supine, seated and standing HEP  Time  4    Status  On-going    Target Date  11/19/18      PT SHORT TERM GOAL #2   Title  Family will demonstrate safe use of Stedy for transfers at home    Time  4    Period  Weeks    Status  On-going    Target Date  11/19/18      PT SHORT TERM GOAL #3   Title  Pt will ambulate x 25' with RW with hand orthosis, trial AFO and mod-max A of one therapist    Time  4    Period  Weeks    Status  Achieved    Target Date  11/19/18        PT Long Term Goals - 10/20/18 1725      PT LONG TERM GOAL #1   Title  Patient and family will be independent with final HEP recommendations    Time  8    Period  Weeks    Status  On-going    Target Date  12/19/18      PT LONG TERM GOAL #2   Title  Patient will perform sit <> stand from wheelchair with min A and transfer stand pivot with RW and AFO with Mod A of one person    Baseline  Min A to stand, not transferring with RW yet    Time  8    Period  Weeks    Status  Partially Met    Target Date  12/19/18      PT LONG TERM GOAL #3   Title  Pt will perform bed mobility on flat mat with min A    Time  8    Period  Weeks    Status  On-going    Target Date  12/19/18      PT LONG TERM GOAL #4   Title  Family will demonstrate independent ability to don appropriate AFO with R shoe    Time  8    Period  Weeks    Status  On-going    Target Date  12/19/18      PT LONG TERM GOAL #5   Title  Patient will ambulate 40 feet or greater with min A, AFO, and LRAD to safely navigate around home.    Time  8    Period  Weeks    Status  On-going    Target Date  12/19/18            Plan - 12/20/18 1347    Clinical Impression Statement  Pt fatigued after OT session and not able to recruit Rt leg muscles very well.  Session focused on functional transfers of sit to stands, stand pivot transfers, and lateral scoot transfers. She will continue to benefit from PT as she still needs assistance with these. She was told to schedule more PT visits upon leaving.   Personal Factors and Comorbidities  Age;Comorbidity 3+;Time since onset of injury/illness/exacerbation    Examination-Activity Limitations  Lift;Dressing;Self Feeding;Transfers;Toileting;Stand;Locomotion Level;Reach Overhead;Bed Mobility    Rehab Potential  Fair    PT Frequency  2x / week    PT Duration  8 weeks    PT Treatment/Interventions  ADLs/Self Care Home Management;Electrical Stimulation;Gait training;Stair training;Functional mobility training;Balance training;Therapeutic exercise;Therapeutic activities;Neuromuscular re-education;Wheelchair mobility training;Manual techniques;Passive range of motion;Patient/family education;DME Instruction;Energy conservation;Orthotic Fit/Training    PT Next Visit Plan  MONITOR BP EVERY SESSION WITH MANUAL CUFF ON LUE;  needs more appointments -  scheduled out through first week of Nov on same days/times as OT (wants to know if they are done with OT before scheduling more), assess for dizziness, Did [facility] send w/c order back?  need to order 4E shoe?  How are exercises going at home?  Training with family - donning AFO, standing aide, wearing schedule.  Stand pivot L and R with RW and AFO. gait with RW. Nustep with R hand orthosis. Seated mirror therapy for LE. Stedy transfers and sit <> stand on Stedy without UE support. Seated on large rockerboard or inverted BOSU performing lateral tilts or anterior/posterior for sit <> stand. Work on bed mobility on flat bed. Add to HEP: WB exercises for mm activation    Consulted and Agree with Plan of Care  Patient       Patient will benefit from skilled therapeutic intervention in order to improve the following deficits and impairments:  Pain, Postural dysfunction, Impaired tone,  Decreased mobility, Decreased activity tolerance, Decreased range of motion, Decreased strength, Impaired UE functional use, Difficulty walking, Decreased balance, Abnormal gait, Decreased endurance  Visit Diagnosis: Muscle weakness (generalized)  Other symptoms and signs involving the nervous system  Abnormal posture  Difficulty in walking, not elsewhere classified  Other abnormalities of gait and mobility     Problem List Patient Active Problem List   Diagnosis Date Noted  . Acute CVA (cerebrovascular accident) ([place]) 10/01/2018  . Physical deconditioning 12/19/2017  . Chronic kidney disease (CKD), stage III (moderate)   . Hypoalbuminemia due to protein-calorie malnutrition ([place])   . Intractable migraine without status migrainosus   . Reactive depression   . Hemiparesis affecting right side as late effect of stroke ([place])   . Slow transit constipation   . PAF (paroxysmal atrial fibrillation) ([place])   . Dysphasia, post-stroke   . COPD (chronic obstructive pulmonary disease) ([place]) 10/29/2017  . Chronic diastolic CHF (congestive heart failure) ([place]) 10/29/2017  . Hypothyroidism 10/29/2017  . Essential hypertension 10/29/2017  . GERD (gastroesophageal reflux disease) 10/29/2017  . Anxiety 10/29/2017  . 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 Hyperlipidemia 04/20/2013    Holly Flores 12/20/2018, 1:50 PM  [facility] [address] [place], [place], [address] Phone: [phone]   Fax:  [phone]  Name: Holly Flores MRN: [id number] Date of Birth: [birth year]

## 2018-12-22 ENCOUNTER — Ambulatory Visit: Payer: Medicare Other | Admitting: Physical Therapy

## 2018-12-22 ENCOUNTER — Ambulatory Visit: Payer: Medicare Other | Admitting: Occupational Therapy

## 2018-12-28 ENCOUNTER — Other Ambulatory Visit: Payer: Self-pay

## 2018-12-28 ENCOUNTER — Ambulatory Visit: Payer: Medicare Other | Admitting: Physical Therapy

## 2018-12-28 ENCOUNTER — Encounter: Payer: Self-pay | Admitting: Physical Therapy

## 2018-12-28 VITALS — BP 126/75

## 2018-12-28 DIAGNOSIS — R29818 Other symptoms and signs involving the nervous system: Secondary | ICD-10-CM

## 2018-12-28 DIAGNOSIS — R293 Abnormal posture: Secondary | ICD-10-CM | POA: Diagnosis not present

## 2018-12-28 DIAGNOSIS — M6281 Muscle weakness (generalized): Secondary | ICD-10-CM

## 2018-12-28 DIAGNOSIS — R262 Difficulty in walking, not elsewhere classified: Secondary | ICD-10-CM

## 2018-12-29 NOTE — Therapy (Addendum)
[facility] [address] [place], [place], [address] Phone: [phone]   Fax:  [phone]  Physical Therapy Treatment/ Re-Cert  Patient Details  Name: Holly Flores MRN: [id number] Date of Birth: [birth year] Referring Provider (PT): Drue Second, FNP   Encounter Date: 12/28/2018    12/29/18 0800  PT Visits / Re-Eval  Visit Number 33  Number of Visits 53  Date for PT Re-Evaluation 02/18/19  Authorization  Authorization Type Progress note every 10th visit; KX modifier at 15th visit.  PT Time Calculation  PT Start Time 1315  PT Stop Time 1358  PT Time Calculation (min) 43 min  PT - End of Session  Equipment Utilized During Treatment Gait belt  Activity Tolerance Patient limited by fatigue;Patient tolerated treatment well  Behavior During Therapy WFL for tasks assessed/performed    Past Medical History:  Diagnosis Date  . Anxiety   . Arthritis    "all over" (12/20/2017)  . Central retinal vein occlusion of left eye 01/22/2015  . CHF (congestive heart failure) ([place])   . Chronic bronchitis ([place])    "get it most years" (12/20/2017)  . Chronic lower back pain   . CKD (chronic kidney disease), stage II   . COPD (chronic obstructive pulmonary disease) ([place])   . CVA (cerebral vascular accident) ([place]) 12/30/2014   OCULAR  LEFT  EYE   . Depression   . Fibromyalgia   . GERD (gastroesophageal reflux disease)   . Goiter   . History of colonic polyps 05/14/2014  . Hyperlipidemia   . Hypertension   . Hypothyroidism   . Kidney cysts    left side  . 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 Migraine    "couple times/wk; been having them for a long time" (12/20/2017)  . Myocardial infarction [facility])    "one dr said I did; one said I didn't;  don't remember when it was" (12/20/2017)  . 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 On home oxygen therapy    "3L; sleep w/it q night"  (12/20/2017)  . Pneumonia    "several times" (12/20/2017)  . Shortness of breath   . Sleep apnea    "stopped mask when I got on the oxygen" (12/20/2017)  . Stroke [facility])     Past Surgical History:  Procedure Laterality Date  . ANTERIOR CERVICAL DECOMP/DISCECTOMY FUSION    . BACK SURGERY    . BREAST BIOPSY     left-non cancerous  . CARDIAC CATHETERIZATION    . CARPAL TUNNEL RELEASE Left   . CATARACT EXTRACTION W/PHACO  09/21/2011   Procedure: CATARACT EXTRACTION PHACO AND INTRAOCULAR LENS PLACEMENT (IOC);  Surgeon: Williams Che, MD;  Location: [facility];  Service: Ophthalmology;  Laterality: Left;  CDE:9.78  . CATARACT EXTRACTION W/PHACO Right 10/15/2014   Procedure: CATARACT EXTRACTION PHACO AND INTRAOCULAR LENS PLACEMENT ([place]);  Surgeon: Williams Che, MD;  Location: [facility];  Service: Ophthalmology;  Laterality: Right;  CDE:4.19  . [place]; 1972  . CHOLECYSTECTOMY OPEN    . COLONOSCOPY  2006   RMR: 1. Internal hemorroids, otherwise normal rectum. 2. Pedunculated polyp at 35 cm. reomved with snare. The remainder of teh colonic mucosa appeared normal.   . COLONOSCOPY  2011   Dr. Gala Romney: multiple ascending colon polyps and rectal polyp, adenomatous  . COLONOSCOPY N/A 05/31/2014   Procedure: COLONOSCOPY;  Surgeon: Daneil Dolin, MD;  Location: [facility];  Service:  Endoscopy;  Laterality: N/A;  130  . COLONOSCOPY WITH PROPOFOL N/A 06/24/2017   Procedure: COLONOSCOPY WITH PROPOFOL;  Surgeon: Daneil Dolin, MD;  Location: [facility];  Service: Endoscopy;  Laterality: N/A;  2:15pm  . ESOPHAGOGASTRODUODENOSCOPY  2006   Dr. Gala Romney: Subtle Schatzkis ring, otherwise normal upper GI tract, aside from a small pyloric channell erosion, status post dilation as described above.   Marland Kitchen FLEXIBLE SIGMOIDOSCOPY N/A 11/11/2017   Procedure: FLEXIBLE SIGMOIDOSCOPY;  Surgeon: Otis Brace, MD;  Location: [facility];  Service: Gastroenterology;  Laterality: N/A;  . JOINT REPLACEMENT     . LUMBAR DISC SURGERY  X 2  . POLYPECTOMY  06/24/2017   Procedure: POLYPECTOMY;  Surgeon: Daneil Dolin, MD;  Location: [facility];  Service: Endoscopy;;  ascending  . POSTERIOR LUMBAR FUSION  ? date 1st fusion ; 07/2006   previous L4-5 Ray threaded fusion cage; Exploration of L4-5 fusion & PLIF 07/22/2006/notes 07/22/2006  . TONSILLECTOMY    . TOTAL KNEE ARTHROPLASTY Right 05/24/2013   Procedure: TOTAL KNEE ARTHROPLASTY;  Surgeon: [name], MD;  Location: [facility];  Service: Orthopedics;  Laterality: Right;  . TOTAL KNEE ARTHROPLASTY Left   . VAGINAL HYSTERECTOMY      Vitals:   12/28/18 1329  BP: 126/75    Subjective Assessment - 12/28/18 1324    Subjective  Reports that she is not having any more dizzines. Said that she is no longer going to be able to get the [place]. Has not been wearing her brace at home. Still needs to find a wider shoe. Reports that she is having pain in her right foot - started about 2 weeks ago, whole foot goes numb.    Patient is accompained by:  Family member   son   Pertinent History  left sided CVA 10/29/17, Chronic CHF, AFib, COPD, Chronic Kidney Disease stage III, hx of bil TKA, Lumbar surgery    Currently in Pain?  No/denies                       [facility] Adult PT Treatment/Exercise - 12/29/18 0001      Transfers   Transfers  Sit to Stand;Stand to Sit    Sit to Stand  4: Min assist    Sit to Stand Details  Verbal cues for sequencing;Verbal cues for technique;Manual facilitation for weight shifting    Sit to Stand Details (indicate cue type and reason)  In // bars with pt using LUE to help pull to stand x3 reps throughout session.        Ambulation/Gait   Pre-Gait Activities  3 bouts of standing in // bars with R AFO donned x1 minute each approx.: cues to stand tall through RLE and shift weight through R hip for weight bearing through RLE.  Unable to tolerate >1 minute due to discomfort from improper fit of R AFO in R shoe - min/mod A for  standing      Therapeutic Activites    Other Therapeutic Activities  With the assistance of another therapist, donned R AFO in pt's original wide shoes with max A. Originally tried 2 bouts of standing with these shoes with pt reporting increased discomfort due to too tight of a fit with R AFO. Pt reporting that her son had recently bought a new 3E wide shoe from [place] for RLE - therapist received those shoes from her son and donned with max A. Attempted standing with these shoes with pt  still reporting increased discomfort. Educated pt that she will need to exchange the 3E wide shoe with Velcro for a 4E wide shoe for better comfort with AFO. Went to [employer] and printed out the exact shoe that pt would need to exchange it with. At end of session discussed with pt's son about calling company and asking to exchange it. Both verbalized understanding.              PT Education - 12/29/18 0800    Education Details  see TA    Person(s) Educated  Patient;Child(ren)    Methods  Explanation;Demonstration    Comprehension  Verbalized understanding       PT Short Term Goals - 11/28/18 1442      PT SHORT TERM GOAL #1   Title  Pt and family will be independent with supine, seated and standing HEP    Time  4    Status  On-going    Target Date  11/19/18      PT SHORT TERM GOAL #2   Title  Family will demonstrate safe use of Stedy for transfers at home    Time  4    Period  Weeks    Status  On-going    Target Date  11/19/18      PT SHORT TERM GOAL #3   Title  Pt will ambulate x 25' with RW with hand orthosis, trial AFO and mod-max A of one therapist    Time  4    Period  Weeks    Status  Achieved    Target Date  11/19/18      revised STGs:  PT Short Term Goals - 12/29/18 1202      PT SHORT TERM GOAL #1   Title  Pt and family will be independent with supine, seated and standing HEP. ALL STGS DUE 01/26/19    Time  4    Period  Weeks    Status  New    Target Date  01/26/19       PT SHORT TERM GOAL #2   Title  Patient will ambulate 81' with mod A of one therapist. with RW and R AFO.    Time  4    Period  Weeks    Status  New    Target Date  01/26/19      PT SHORT TERM GOAL #3   Title  Patient will perform sit <> stand from mat table with R AFO and RW with mod A of one therapist.    Time  4    Period  Weeks    Status  New    Target Date  01/26/19        PT Long Term Goals - 12/29/18 0806      PT LONG TERM GOAL #1   Title  Patient and family will be independent with final HEP recommendations    Baseline  pt reports that she is trying to perform HEP at home when she can.    Time  8    Period  Weeks    Status  On-going      PT LONG TERM GOAL #2   Title  Patient will perform sit <> stand from wheelchair with min A and transfer stand pivot with RW and AFO with Mod A of one person    Baseline  slow to progress due to pt not having the proper shoe size to wear R AFO    Time  8    Period  Weeks    Status  Not Met      PT LONG TERM GOAL #3   Title  Pt will perform bed mobility on flat mat with min A    Baseline  did not assess on 12/29/18 due to time constraints - will assess at next visit    Time  8    Period  Weeks    Status  Deferred      PT LONG TERM GOAL #4   Title  Family will demonstrate independent ability to don appropriate AFO with R shoe    Baseline  pt needing max A from therapist to don R AFO due to improper shoe size/width    Time  8    Period  Weeks    Status  Not Met      PT LONG TERM GOAL #5   Title  Patient will ambulate 40 feet or greater with min A, AFO, and LRAD to safely navigate around home.    Baseline  slow to progress due to pt not having the proper shoe size to wear R AFO    Time  8    Period  Weeks    Status  Not Met        On-going LTGs for re-cert:     PT Long Term Goals - 12/29/18 1159      PT LONG TERM GOAL #1   Title  Patient and family will be independent with final HEP recommendations. ALL LTGS DUE  02/23/19    Baseline  pt reports that she is trying to perform HEP at home when she can.    Time  8    Period  Weeks    Status  On-going    Target Date  02/23/19      PT LONG TERM GOAL #2   Title  Patient will perform sit <> stand from wheelchair with min A and transfer stand pivot with RW and AFO with Mod A of one person    Baseline  slow to progress due to pt not having the proper shoe size to wear R AFO    Time  8    Period  Weeks    Status  On-going    Target Date  02/23/19      PT LONG TERM GOAL #3   Title  Pt will perform bed mobility on flat mat with min A    Time  8    Period  Weeks    Status  On-going    Target Date  02/23/19      PT LONG TERM GOAL #4   Title  Family will demonstrate independent ability to don appropriate AFO with R shoe    Baseline  pt needing max A from therapist to don R AFO due to improper shoe size/width    Time  8    Period  Weeks    Status  On-going    Target Date  02/23/19      PT LONG TERM GOAL #5   Title  Patient will ambulate 40 feet or greater with min A, AFO, and LRAD to safely navigate around home.    Baseline  slow to progress due to pt not having the proper shoe size to wear R AFO    Time  8    Period  Weeks    Status  On-going    Target Date  02/23/19  Plan - 12/29/18 0806    Clinical Impression Statement  Focus of today's session was donning pt's R AFO, education about proper shoes to wear, and pre-gait training in // bars. BP WFL for therapy. Pt's progress recently has been limited with PT due to pt not having the proper shoe size to wear with her R AFO (pt and family have previously been educated about this information). With both pair of shoes today (one being a 3E - the most recent one that has been purchased), pt unable to tolerate standing with R AFO due to improper fit of shoes for greater than 1 minute, and needed min A to stand as well as verbal/tactile cues to shift to R side. Pt and pt's son verbalized understanding  at end of session for correct shoe to purchase on R form [employer]. Pt is making slow progress towards goals - will re-cert for 2x week for 8 weeks in order to focus on transfers, bed mobility, and gait training to decrease caregiver burden at home. Will continue to progress towards LTGs when pt has appropriate shoe for R AFO.    Personal Factors and Comorbidities  Age;Comorbidity 3+;Time since onset of injury/illness/exacerbation    Examination-Activity Limitations  Lift;Dressing;Self Feeding;Transfers;Toileting;Stand;Locomotion Level;Reach Overhead;Bed Mobility    Rehab Potential  Fair    PT Frequency  2x / week    PT Duration  8 weeks    PT Treatment/Interventions  ADLs/Self Care Home Management;Electrical Stimulation;Gait training;Stair training;Functional mobility training;Balance training;Therapeutic exercise;Therapeutic activities;Neuromuscular re-education;Wheelchair mobility training;Manual techniques;Passive range of motion;Patient/family education;DME Instruction;Energy conservation;Orthotic Fit/Training    PT Next Visit Plan  MONITOR BP EVERY SESSION WITH MANUAL CUFF ON LUE;  assess for dizziness, Did [facility] send w/c order back?  need to order 4E shoe?  How are exercises going at home?  Training with family - donning AFO, standing aide, wearing schedule.  Stand pivot L and R with RW and AFO. gait with RW. Nustep with R hand orthosis. Seated mirror therapy for LE. Stedy transfers and sit <> stand on Stedy without UE support. Seated on large rockerboard or inverted BOSU performing lateral tilts or anterior/posterior for sit <> stand. Work on bed mobility on flat bed. Add to HEP: WB exercises for mm activation    Consulted and Agree with Plan of Care  Patient       Patient will benefit from skilled therapeutic intervention in order to improve the following deficits and impairments:  Pain, Postural dysfunction, Impaired tone, Decreased mobility, Decreased activity tolerance, Decreased range of  motion, Decreased strength, Impaired UE functional use, Difficulty walking, Decreased balance, Abnormal gait, Decreased endurance  Visit Diagnosis: Muscle weakness (generalized)  Other symptoms and signs involving the nervous system  Abnormal posture  Difficulty in walking, not elsewhere classified     Problem List Patient Active Problem List   Diagnosis Date Noted  . Acute CVA (cerebrovascular accident) ([place]) 10/01/2018  . Physical deconditioning 12/19/2017  . Chronic kidney disease (CKD), stage III (moderate)   . Hypoalbuminemia due to protein-calorie malnutrition ([place])   . Intractable migraine without status migrainosus   . Reactive depression   . Hemiparesis affecting right side as late effect of stroke ([place])   . Slow transit constipation   . PAF (paroxysmal atrial fibrillation) ([place])   . Dysphasia, post-stroke   . COPD (chronic obstructive pulmonary disease) ([place]) 10/29/2017  . Chronic diastolic CHF (congestive heart failure) ([place]) 10/29/2017  . Hypothyroidism 10/29/2017  . Essential hypertension 10/29/2017  . GERD (gastroesophageal reflux disease) 10/29/2017  .  Anxiety 10/29/2017  . 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 Hyperlipidemia 04/20/2013    Arliss Journey, PT, DPT  12/29/2018, 11:58 AM  [facility] [address] [place], [place], [address] Phone: [phone]   Fax:  [phone]  Name: Holly Flores MRN: [id number] Date of Birth: November 21, 1945

## 2019-01-03 ENCOUNTER — Ambulatory Visit: Payer: Medicare Other | Admitting: Occupational Therapy

## 2019-01-06 ENCOUNTER — Ambulatory Visit: Payer: Medicare Other | Admitting: Physical Therapy

## 2019-01-17 ENCOUNTER — Ambulatory Visit: Payer: Medicare Other | Attending: *Deleted | Admitting: Physical Therapy

## 2019-01-17 ENCOUNTER — Other Ambulatory Visit: Payer: Self-pay

## 2019-01-17 ENCOUNTER — Encounter: Payer: Self-pay | Admitting: Physical Therapy

## 2019-01-17 DIAGNOSIS — R2689 Other abnormalities of gait and mobility: Secondary | ICD-10-CM | POA: Insufficient documentation

## 2019-01-17 DIAGNOSIS — R29818 Other symptoms and signs involving the nervous system: Secondary | ICD-10-CM | POA: Insufficient documentation

## 2019-01-17 DIAGNOSIS — R293 Abnormal posture: Secondary | ICD-10-CM | POA: Insufficient documentation

## 2019-01-17 DIAGNOSIS — I69351 Hemiplegia and hemiparesis following cerebral infarction affecting right dominant side: Secondary | ICD-10-CM | POA: Insufficient documentation

## 2019-01-17 DIAGNOSIS — R262 Difficulty in walking, not elsewhere classified: Secondary | ICD-10-CM | POA: Diagnosis present

## 2019-01-17 DIAGNOSIS — M21371 Foot drop, right foot: Secondary | ICD-10-CM | POA: Insufficient documentation

## 2019-01-17 DIAGNOSIS — M6281 Muscle weakness (generalized): Secondary | ICD-10-CM | POA: Diagnosis not present

## 2019-01-17 NOTE — Therapy (Signed)
[facility] [address] [place], [place], [address] Phone: [phone]   Fax:  [phone]  Physical Therapy Treatment  Patient Details  Name: HUGO DURAND MRN: [id number] Date of Birth: 1945-04-27 Referring Provider (PT): Drue Second, FNP   Encounter Date: 01/17/2019  PT End of Session - 01/17/19 1409    Visit Number  34    Number of Visits  77    Date for PT Re-Evaluation  02/18/19    Authorization Type  Progress note every 10th visit; KX modifier at 15th visit.    PT Start Time  1318    PT Stop Time  1400    PT Time Calculation (min)  42 min    Activity Tolerance  Patient tolerated treatment well    Behavior During Therapy  WFL for tasks assessed/performed       Past Medical History:  Diagnosis Date  . Anxiety   . Arthritis    "all over" (12/20/2017)  . Central retinal vein occlusion of left eye 01/22/2015  . CHF (congestive heart failure) ([place])   . Chronic bronchitis ([place])    "get it most years" (12/20/2017)  . Chronic lower back pain   . CKD (chronic kidney disease), stage II   . COPD (chronic obstructive pulmonary disease) ([place])   . CVA (cerebral vascular accident) ([place]) 12/30/2014   OCULAR  LEFT  EYE   . Depression   . Fibromyalgia   . GERD (gastroesophageal reflux disease)   . Goiter   . History of colonic polyps 05/14/2014  . Hyperlipidemia   . Hypertension   . Hypothyroidism   . Kidney cysts    left side  . 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 Migraine    "couple times/wk; been having them for a long time" (12/20/2017)  . Myocardial infarction [facility])    "one dr said I did; one said I didn't;  don't remember when it was" (12/20/2017)  . 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 On home oxygen therapy    "3L; sleep w/it q night" (12/20/2017)  . Pneumonia    "several times" (12/20/2017)  . Shortness of breath   . Sleep apnea    "stopped mask  when I got on the oxygen" (12/20/2017)  . Stroke [facility])     Past Surgical History:  Procedure Laterality Date  . ANTERIOR CERVICAL DECOMP/DISCECTOMY FUSION    . BACK SURGERY    . BREAST BIOPSY     left-non cancerous  . CARDIAC CATHETERIZATION    . CARPAL TUNNEL RELEASE Left   . CATARACT EXTRACTION W/PHACO  09/21/2011   Procedure: CATARACT EXTRACTION PHACO AND INTRAOCULAR LENS PLACEMENT (IOC);  Surgeon: Williams Che, MD;  Location: [facility];  Service: Ophthalmology;  Laterality: Left;  CDE:9.78  . CATARACT EXTRACTION W/PHACO Right 10/15/2014   Procedure: CATARACT EXTRACTION PHACO AND INTRAOCULAR LENS PLACEMENT ([place]);  Surgeon: Williams Che, MD;  Location: [facility];  Service: Ophthalmology;  Laterality: Right;  CDE:4.19  . [place]; 1972  . CHOLECYSTECTOMY OPEN    . COLONOSCOPY  2006   RMR: 1. Internal hemorroids, otherwise normal rectum. 2. Pedunculated polyp at 35 cm. reomved with snare. The remainder of teh colonic mucosa appeared normal.   . COLONOSCOPY  2011   Dr. Gala Romney: multiple ascending colon polyps and rectal polyp, adenomatous  . COLONOSCOPY N/A 05/31/2014   Procedure: COLONOSCOPY;  Surgeon: Daneil Dolin, MD;  Location:  [facility];  Service: Endoscopy;  Laterality: N/A;  130  . COLONOSCOPY WITH PROPOFOL N/A 06/24/2017   Procedure: COLONOSCOPY WITH PROPOFOL;  Surgeon: Daneil Dolin, MD;  [facility];  Service: Endoscopy;  Laterality: N/A;  2:15pm  . ESOPHAGOGASTRODUODENOSCOPY  2006   Dr. Gala Romney: Subtle Schatzkis ring, otherwise normal upper GI tract, aside from a small pyloric channell erosion, status post dilation as described above.   Marland Kitchen FLEXIBLE SIGMOIDOSCOPY N/A 11/11/2017   Procedure: FLEXIBLE SIGMOIDOSCOPY;  Surgeon: Otis Brace, MD;  Location: [facility];  Service: Gastroenterology;  Laterality: N/A;  . JOINT REPLACEMENT    . LUMBAR DISC SURGERY  X 2  . POLYPECTOMY  06/24/2017   Procedure: POLYPECTOMY;  Surgeon: Daneil Dolin, MD;   Location: [facility];  Service: Endoscopy;;  ascending  . POSTERIOR LUMBAR FUSION  ? date 1st fusion ; 07/2006   previous L4-5 Ray threaded fusion cage; Exploration of L4-5 fusion & PLIF 07/22/2006/notes 07/22/2006  . TONSILLECTOMY    . TOTAL KNEE ARTHROPLASTY Right 05/24/2013   Procedure: TOTAL KNEE ARTHROPLASTY;  Surgeon: [name], MD;  Location: [facility];  Service: Orthopedics;  Laterality: Right;  . TOTAL KNEE ARTHROPLASTY Left   . VAGINAL HYSTERECTOMY      There were no vitals filed for this visit.  Subjective Assessment - 01/17/19 1322    Subjective  Reports she is not having any dizziness. Still states that she is unable to get the [place]. Son has ordered the wider shoe and states that it should be getting here soon.    Patient is accompained by:  Family member   son   Pertinent History  left sided CVA 10/29/17, Chronic CHF, AFib, COPD, Chronic Kidney Disease stage III, hx of bil TKA, Lumbar surgery    Limitations  Lifting;Standing;Walking;House hold activities    Diagnostic tests  MRI: left external capsule CVA    Patient Stated Goals  move around better in the home and improve function    Currently in Pain?  No/denies                       [facility] Adult PT Treatment/Exercise - 01/17/19 0001      Transfers   Comments  Lateral sliding board transfer from pt's w/c <> mat table transferring towards stronger side, initial transfer needing mod-max A of 2 to 3 to safely complete transfer due to pt's seat sliding forward during transfer. At end of session 2nd transfer required mod A of 2 to complete with pt better able to initiate movement. Needed constant cues throughout for weight shifting. Needed mod A of 1-2 to shift BLEs posteriorly into chair for proper positioning.      Neuro Re-ed    Neuro Re-ed Details   Seated at edge of mat with BLE planted on floor and chair posteriorly with 2 pillows, mirror anteriorly for visual feedback for midline, massed practice core  activation leaning back in chair with resisted red theraband for trunk extensor strengthening and leaning anteriorly and towards R towards therapist for improving weight bearing through RLE. Progressing to add a cross body reach with LUE to touch cone on R. cues for visual tracking and looking at cone when reaching. Min guard/min A for balance.              PT Education - 01/17/19 1420    Education Details  Educated pt on importance of having a larger shoe for her AFO  in  order to be able to practice standing and more functional transfers (per pt's son her new shoe will be coming in the mail soon).       PT Short Term Goals - 12/29/18 1202      PT SHORT TERM GOAL #1   Title  Pt and family will be independent with supine, seated and standing HEP. ALL STGS DUE 01/26/19    Time  4    Period  Weeks    Status  New    Target Date  01/26/19      PT SHORT TERM GOAL #2   Title  Patient will ambulate 70' with mod A of one therapist. with RW and R AFO.    Time  4    Period  Weeks    Status  New    Target Date  01/26/19      PT SHORT TERM GOAL #3   Title  Patient will perform sit <> stand from mat table with R AFO and RW with mod A of one therapist.    Time  4    Period  Weeks    Status  New    Target Date  01/26/19        PT Long Term Goals - 12/29/18 1159      PT LONG TERM GOAL #1   Title  Patient and family will be independent with final HEP recommendations. ALL LTGS DUE 02/23/19    Baseline  pt reports that she is trying to perform HEP at home when she can.    Time  8    Period  Weeks    Status  On-going    Target Date  02/23/19      PT LONG TERM GOAL #2   Title  Patient will perform sit <> stand from wheelchair with min A and transfer stand pivot with RW and AFO with Mod A of one person    Baseline  slow to progress due to pt not having the proper shoe size to wear R AFO    Time  8    Period  Weeks    Status  On-going    Target Date  02/23/19      PT LONG TERM GOAL  #3   Title  Pt will perform bed mobility on flat mat with min A    Time  8    Period  Weeks    Status  On-going    Target Date  02/23/19      PT LONG TERM GOAL #4   Title  Family will demonstrate independent ability to don appropriate AFO with R shoe    Baseline  pt needing max A from therapist to don R AFO due to improper shoe size/width    Time  8    Period  Weeks    Status  On-going    Target Date  02/23/19      PT LONG TERM GOAL #5   Title  Patient will ambulate 40 feet or greater with min A, AFO, and LRAD to safely navigate around home.    Baseline  slow to progress due to pt not having the proper shoe size to wear R AFO    Time  8    Period  Weeks    Status  On-going    Target Date  02/23/19            Plan - 01/17/19 1421    Clinical Impression Statement  Today's skilled  session focused on lateral sliding board scoot transfers from w/c <> mat and sitting balance with core activation and weight shifting towards RLE. During initial transfer needing max A of 2-3 to safely complete transfer over to mat table due to pt's w/c cushion sliding too far anteriorly. Remainder of session focused on seated balance with core activation and reaching with LUE towards cone on R for increased R weight bearing. Will continue to progress towards LTGs.    Personal Factors and Comorbidities  Age;Comorbidity 3+;Time since onset of injury/illness/exacerbation    Examination-Activity Limitations  Lift;Dressing;Self Feeding;Transfers;Toileting;Stand;Locomotion Level;Reach Overhead;Bed Mobility    Rehab Potential  Fair    PT Frequency  2x / week    PT Duration  8 weeks    PT Treatment/Interventions  ADLs/Self Care Home Management;Electrical Stimulation;Gait training;Stair training;Functional mobility training;Balance training;Therapeutic exercise;Therapeutic activities;Neuromuscular re-education;Wheelchair mobility training;Manual techniques;Passive range of motion;Patient/family education;DME  Instruction;Energy conservation;Orthotic Fit/Training    PT Next Visit Plan  MONITOR BP EVERY SESSION WITH MANUAL CUFF ON LUE;  Did [facility] send w/c order back?  has 4E shoe come in yet?  Training with family - donning AFO, standing aide, wearing schedule.  Stand pivot L and R with RW and AFO. gait with RW. Nustep with R hand orthosis. Seated mirror therapy for LE. Stedy transfers and sit <> stand on Stedy without UE support. Seated on large rockerboard or inverted BOSU performing lateral tilts or anterior/posterior for sit <> stand. Work on bed mobility on flat bed. Add to HEP: WB exercises for mm activation    Consulted and Agree with Plan of Care  Patient       Patient will benefit from skilled therapeutic intervention in order to improve the following deficits and impairments:  Pain, Postural dysfunction, Impaired tone, Decreased mobility, Decreased activity tolerance, Decreased range of motion, Decreased strength, Impaired UE functional use, Difficulty walking, Decreased balance, Abnormal gait, Decreased endurance  Visit Diagnosis: Muscle weakness (generalized)  Other symptoms and signs involving the nervous system  Abnormal posture  Difficulty in walking, not elsewhere classified     Problem List Patient Active Problem List   Diagnosis Date Noted  . Acute CVA (cerebrovascular accident) ([place]) 10/01/2018  . Physical deconditioning 12/19/2017  . Chronic kidney disease (CKD), stage III (moderate)   . Hypoalbuminemia due to protein-calorie malnutrition ([place])   . Intractable migraine without status migrainosus   . Reactive depression   . Hemiparesis affecting right side as late effect of stroke ([place])   . Slow transit constipation   . PAF (paroxysmal atrial fibrillation) ([place])   . Dysphasia, post-stroke   . COPD (chronic obstructive pulmonary disease) ([place]) 10/29/2017  . Chronic diastolic CHF (congestive heart failure) ([place]) 10/29/2017  . Hypothyroidism 10/29/2017  . Essential  hypertension 10/29/2017  . GERD (gastroesophageal reflux disease) 10/29/2017  . Anxiety 10/29/2017  . 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 Hyperlipidemia 04/20/2013    Arliss Journey, PT, DPT  01/17/2019, 2:25 PM  [facility] [address] [place], [place], [address] Phone: [phone]   Fax:  [phone]  Name: MALAYJA NICOSIA MRN: [id number] Date of Birth: July 23, 1945

## 2019-01-19 ENCOUNTER — Ambulatory Visit: Payer: Medicare Other | Admitting: Occupational Therapy

## 2019-01-19 ENCOUNTER — Ambulatory Visit: Payer: Medicare Other

## 2019-01-19 ENCOUNTER — Ambulatory Visit: Payer: Medicare Other | Admitting: Rehabilitation

## 2019-01-20 ENCOUNTER — Ambulatory Visit: Payer: Medicare Other | Admitting: Physical Therapy

## 2019-01-24 ENCOUNTER — Encounter: Payer: Medicare Other | Admitting: Occupational Therapy

## 2019-01-24 ENCOUNTER — Ambulatory Visit: Payer: Medicare Other

## 2019-01-26 ENCOUNTER — Ambulatory Visit: Payer: Medicare Other | Admitting: Physical Therapy

## 2019-01-26 ENCOUNTER — Encounter: Payer: Medicare Other | Admitting: Occupational Therapy

## 2019-01-27 ENCOUNTER — Ambulatory Visit: Payer: Medicare Other | Admitting: Physical Therapy

## 2019-01-31 ENCOUNTER — Encounter: Payer: Medicare Other | Admitting: Occupational Therapy

## 2019-01-31 ENCOUNTER — Ambulatory Visit: Payer: Medicare Other

## 2019-02-02 ENCOUNTER — Ambulatory Visit: Payer: Medicare Other | Admitting: Physical Therapy

## 2019-02-02 ENCOUNTER — Other Ambulatory Visit: Payer: Self-pay

## 2019-02-02 ENCOUNTER — Encounter: Payer: Medicare Other | Admitting: Occupational Therapy

## 2019-02-02 ENCOUNTER — Encounter: Payer: Self-pay | Admitting: Physical Therapy

## 2019-02-02 DIAGNOSIS — R262 Difficulty in walking, not elsewhere classified: Secondary | ICD-10-CM

## 2019-02-02 DIAGNOSIS — R29818 Other symptoms and signs involving the nervous system: Secondary | ICD-10-CM

## 2019-02-02 DIAGNOSIS — M6281 Muscle weakness (generalized): Secondary | ICD-10-CM | POA: Diagnosis not present

## 2019-02-02 DIAGNOSIS — R293 Abnormal posture: Secondary | ICD-10-CM

## 2019-02-02 NOTE — Therapy (Signed)
[facility] [address] [place], [place], [address] Phone: [phone]   Fax:  [phone]  Physical Therapy Treatment  Patient Details  Name: Holly Flores MRN: [id number] Date of Birth: Jun 23, 1945 Referring Provider (PT): Drue Second, FNP   Encounter Date: 02/02/2019  PT End of Session - 02/02/19 1505    Visit Number  35    Number of Visits  4    Date for PT Re-Evaluation  02/18/19    Authorization Type  Progress note every 10th visit; KX modifier at 15th visit.    PT Start Time  1455    PT Stop Time  1535    PT Time Calculation (min)  40 min    Activity Tolerance  Patient tolerated treatment well    Behavior During Therapy  WFL for tasks assessed/performed       Past Medical History:  Diagnosis Date  . Anxiety   . Arthritis    "all over" (12/20/2017)  . Central retinal vein occlusion of left eye 01/22/2015  . CHF (congestive heart failure) ([place])   . Chronic bronchitis ([place])    "get it most years" (12/20/2017)  . Chronic lower back pain   . CKD (chronic kidney disease), stage II   . COPD (chronic obstructive pulmonary disease) ([place])   . CVA (cerebral vascular accident) ([place]) 12/30/2014   OCULAR  LEFT  EYE   . Depression   . Fibromyalgia   . GERD (gastroesophageal reflux disease)   . Goiter   . History of colonic polyps 05/14/2014  . Hyperlipidemia   . Hypertension   . Hypothyroidism   . Kidney cysts    left side  . 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 Migraine    "couple times/wk; been having them for a long time" (12/20/2017)  . Myocardial infarction [facility])    "one dr said I did; one said I didn't;  don't remember when it was" (12/20/2017)  . 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 On home oxygen therapy    "3L; sleep w/it q night" (12/20/2017)  . Pneumonia    "several times" (12/20/2017)  . Shortness of breath   . Sleep apnea    "stopped mask  when I got on the oxygen" (12/20/2017)  . Stroke [facility])     Past Surgical History:  Procedure Laterality Date  . ANTERIOR CERVICAL DECOMP/DISCECTOMY FUSION    . BACK SURGERY    . BREAST BIOPSY     left-non cancerous  . CARDIAC CATHETERIZATION    . CARPAL TUNNEL RELEASE Left   . CATARACT EXTRACTION W/PHACO  09/21/2011   Procedure: CATARACT EXTRACTION PHACO AND INTRAOCULAR LENS PLACEMENT (IOC);  Surgeon: Williams Che, MD;  Location: [facility];  Service: Ophthalmology;  Laterality: Left;  CDE:9.78  . CATARACT EXTRACTION W/PHACO Right 10/15/2014   Procedure: CATARACT EXTRACTION PHACO AND INTRAOCULAR LENS PLACEMENT ([place]);  Surgeon: Williams Che, MD;  Location: [facility];  Service: Ophthalmology;  Laterality: Right;  CDE:4.19  . [place]; 1972  . CHOLECYSTECTOMY OPEN    . COLONOSCOPY  2006   RMR: 1. Internal hemorroids, otherwise normal rectum. 2. Pedunculated polyp at 35 cm. reomved with snare. The remainder of teh colonic mucosa appeared normal.   . COLONOSCOPY  2011   Dr. Gala Romney: multiple ascending colon polyps and rectal polyp, adenomatous  . COLONOSCOPY N/A 05/31/2014   Procedure: COLONOSCOPY;  Surgeon: Daneil Dolin, MD;  [facility]:  [facility];  Service: Endoscopy;  Laterality: N/A;  130  . COLONOSCOPY WITH PROPOFOL N/A 06/24/2017   Procedure: COLONOSCOPY WITH PROPOFOL;  Surgeon: Daneil Dolin, MD;  [facility];  Service: Endoscopy;  Laterality: N/A;  2:15pm  . ESOPHAGOGASTRODUODENOSCOPY  2006   Dr. Gala Romney: Subtle Schatzkis ring, otherwise normal upper GI tract, aside from a small pyloric channell erosion, status post dilation as described above.   Marland Kitchen FLEXIBLE SIGMOIDOSCOPY N/A 11/11/2017   Procedure: FLEXIBLE SIGMOIDOSCOPY;  Surgeon: Otis Brace, MD;  Location: [facility];  Service: Gastroenterology;  Laterality: N/A;  . JOINT REPLACEMENT    . LUMBAR DISC SURGERY  X 2  . POLYPECTOMY  06/24/2017   Procedure: POLYPECTOMY;  Surgeon: Daneil Dolin, MD;   [facility];  Service: Endoscopy;;  ascending  . POSTERIOR LUMBAR FUSION  ? date 1st fusion ; 07/2006   previous L4-5 Ray threaded fusion cage; Exploration of L4-5 fusion & PLIF 07/22/2006/notes 07/22/2006  . TONSILLECTOMY    . TOTAL KNEE ARTHROPLASTY Right 05/24/2013   Procedure: TOTAL KNEE ARTHROPLASTY;  Surgeon: [name], MD;  Location: [facility];  Service: Orthopedics;  Laterality: Right;  . TOTAL KNEE ARTHROPLASTY Left   . VAGINAL HYSTERECTOMY      There were no vitals filed for this visit.  Subjective Assessment - 02/02/19 1458    Subjective  Reports staying home for PT because her neighbor wasn't sure if she had COVID or not but results indicated that she was negative so pt felt safe to come today. She hasn't heard anything about the stand assist. Overall, doing well and BP was good this morning. Her larger shoe has not come in yet so unable to use AFO. Pt reported having a UTI and having more back pain today.    Patient is accompained by:  Family member   son   Pertinent History  left sided CVA 10/29/17, Chronic CHF, AFib, COPD, Chronic Kidney Disease stage III, hx of bil TKA, Lumbar surgery    Limitations  Lifting;Standing;Walking;House hold activities    Diagnostic tests  MRI: left external capsule CVA    Patient Stated Goals  move around better in the home and improve function    Currently in Pain?  No/denies                       [facility] Adult PT Treatment/Exercise - 02/02/19 1526      Transfers   Transfers  Sit to Stand;Stand to Sit    Sit to Stand  4: Min assist    Sit to Stand Details  Verbal cues for sequencing;Verbal cues for technique;Manual facilitation for weight shifting    Sit to Stand Details (indicate cue type and reason)  Cues for scoot to edge of seat and forward trunk lean, cues for weight shift once in standing     Stand to Sit  4: Min assist    Stand to Sit Details (indicate cue type and reason)  Manual facilitation for weight  shifting;Manual facilitation for placement    Stand to Sit Details  therapist releasing and assisting RUE       Ambulation/Gait   Ambulation/Gait  Yes    Ambulation/Gait Assistance  3: Mod assist;2: Max assist    Ambulation/Gait Assistance Details  Therapist provided tactile cues at pelvis for weight shifting over stance LE, cues for RLE advancement and assistance at RLE to prevent ER in stance, assistance and verbal cues to bring COG  fully over RLE in stance.     Ambulation Distance (Feet)  25 Feet    Assistive device  Rolling walker   R AFO and heel wedge   Gait Pattern  Step-to pattern;Decreased step length - left;Decreased stance time - right;Decreased hip/knee flexion - right;Decreased dorsiflexion - right;Decreased weight shift to left;Trunk flexed;Step-through pattern    Ambulation Surface  Level;Indoor    Gait Comments  Performed side stepping with RW, R AFO & heel wedge x5 to the R and x3 to the L pt had to sit down d/t significant fatigue and reports of LBP             PT Education - 02/02/19 1502    Education Details  PT discussed what all goes into a w/c evaluation and how that helps determine which type of chair/components would be most beneficial to the patient after she asked what that would look like    Person(s) Educated  Patient    Methods  Explanation    Comprehension  Verbalized understanding       PT Short Term Goals - 02/02/19 1604      PT SHORT TERM GOAL #1   Title  Pt and family will be independent with supine, seated and standing HEP. ALL STGS DUE 01/26/19    Baseline  02/02/19: dependent to date    Time  4    Period  Weeks    Status  On-going    Target Date  01/26/19      PT SHORT TERM GOAL #2   Title  Patient will ambulate 69' with mod A of one therapist. with RW and R AFO.    Baseline  02/02/19: pt does not have larger shoe to fit her AFO yet    Time  4    Period  Weeks    Status  Unable to assess    Target Date  01/26/19      PT SHORT TERM  GOAL #3   Title  Patient will perform sit <> stand from mat table with R AFO and RW with mod A of one therapist.    Baseline  02/02/19: pt does not have larger shoe to fit her AFO yet    Time  4    Period  Weeks    Status  Unable to assess    Target Date  01/26/19        PT Long Term Goals - 12/29/18 1159      PT LONG TERM GOAL #1   Title  Patient and family will be independent with final HEP recommendations. ALL LTGS DUE 02/23/19    Baseline  pt reports that she is trying to perform HEP at home when she can.    Time  8    Period  Weeks    Status  On-going    Target Date  02/23/19      PT LONG TERM GOAL #2   Title  Patient will perform sit <> stand from wheelchair with min A and transfer stand pivot with RW and AFO with Mod A of one person    Baseline  slow to progress due to pt not having the proper shoe size to wear R AFO    Time  8    Period  Weeks    Status  On-going    Target Date  02/23/19      PT LONG TERM GOAL #3   Title  Pt will perform bed mobility on flat mat  with min A    Time  8    Period  Weeks    Status  On-going    Target Date  02/23/19      PT LONG TERM GOAL #4   Title  Family will demonstrate independent ability to don appropriate AFO with R shoe    Baseline  pt needing max A from therapist to don R AFO due to improper shoe size/width    Time  8    Period  Weeks    Status  On-going    Target Date  02/23/19      PT LONG TERM GOAL #5   Title  Patient will ambulate 40 feet or greater with min A, AFO, and LRAD to safely navigate around home.    Baseline  slow to progress due to pt not having the proper shoe size to wear R AFO    Time  8    Period  Weeks    Status  On-going    Target Date  02/23/19            Plan - 02/02/19 1604    Clinical Impression Statement  Pt did not bring in her AFO to today's session and has not gotten the larger shoes to support her AFO -- used clinic's AFO and heel wedge & practiced some gait and side stepping with RW;  STGs deferred until pt has new shoes to support AFO. Pt was significantly fatigued today requiring intermittent rest breaks. Extensive education given in regard to how w/c evaluation is conducted and what type of information is used to determined the best chair/components for pt's functional needs. Will continue to progress towards LTGs.    Personal Factors and Comorbidities  Age;Comorbidity 3+;Time since onset of injury/illness/exacerbation    Examination-Activity Limitations  Lift;Dressing;Self Feeding;Transfers;Toileting;Stand;Locomotion Level;Reach Overhead;Bed Mobility    Rehab Potential  Fair    PT Frequency  2x / week    PT Duration  8 weeks    PT Treatment/Interventions  ADLs/Self Care Home Management;Electrical Stimulation;Gait training;Stair training;Functional mobility training;Balance training;Therapeutic exercise;Therapeutic activities;Neuromuscular re-education;Wheelchair mobility training;Manual techniques;Passive range of motion;Patient/family education;DME Instruction;Energy conservation;Orthotic Fit/Training    PT Next Visit Plan  MONITOR BP EVERY SESSION WITH MANUAL CUFF ON LUE;  Did [facility] send w/c order back?  has 4E shoe come in yet?  Training with family - donning AFO, standing aide, wearing schedule.  Stand pivot L and R with RW and AFO. gait with RW. Nustep with R hand orthosis. Seated mirror therapy for LE. Stedy transfers and sit <> stand on Stedy without UE support. Seated on large rockerboard or inverted BOSU performing lateral tilts or anterior/posterior for sit <> stand. Work on bed mobility on flat bed. Add to HEP: WB exercises for mm activation    Consulted and Agree with Plan of Care  Patient       Patient will benefit from skilled therapeutic intervention in order to improve the following deficits and impairments:  Pain, Postural dysfunction, Impaired tone, Decreased mobility, Decreased activity tolerance, Decreased range of motion, Decreased strength, Impaired UE  functional use, Difficulty walking, Decreased balance, Abnormal gait, Decreased endurance  Visit Diagnosis: Muscle weakness (generalized)  Other symptoms and signs involving the nervous system  Abnormal posture  Difficulty in walking, not elsewhere classified     Problem List Patient Active Problem List   Diagnosis Date Noted  . Acute CVA (cerebrovascular accident) ([place]) 10/01/2018  . Physical deconditioning 12/19/2017  . Chronic kidney disease (CKD), stage III (moderate)   .  Hypoalbuminemia due to protein-calorie malnutrition ([place])   . Intractable migraine without status migrainosus   . Reactive depression   . Hemiparesis affecting right side as late effect of stroke ([place])   . Slow transit constipation   . PAF (paroxysmal atrial fibrillation) ([place])   . Dysphasia, post-stroke   . COPD (chronic obstructive pulmonary disease) ([place]) 10/29/2017  . Chronic diastolic CHF (congestive heart failure) ([place]) 10/29/2017  . Hypothyroidism 10/29/2017  . Essential hypertension 10/29/2017  . GERD (gastroesophageal reflux disease) 10/29/2017  . Anxiety 10/29/2017  . 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 Hyperlipidemia 04/20/2013    Juliann Pulse SPT 02/02/2019, 4:53 PM  [facility] [address] [place], [place], [address] Phone: [phone]   Fax:  [phone]  Name: AVERLEE REDFERN MRN: [id number] Date of Birth: 02-Oct-1945

## 2019-02-03 ENCOUNTER — Ambulatory Visit: Payer: Medicare Other | Admitting: Physical Therapy

## 2019-02-06 ENCOUNTER — Ambulatory Visit: Payer: Medicare Other | Admitting: Physical Therapy

## 2019-02-06 ENCOUNTER — Encounter: Payer: Medicare Other | Admitting: Occupational Therapy

## 2019-02-08 ENCOUNTER — Other Ambulatory Visit: Payer: Self-pay

## 2019-02-08 ENCOUNTER — Ambulatory Visit: Payer: Medicare Other | Admitting: Physical Therapy

## 2019-02-08 ENCOUNTER — Encounter: Payer: Self-pay | Admitting: Physical Therapy

## 2019-02-08 DIAGNOSIS — M6281 Muscle weakness (generalized): Secondary | ICD-10-CM | POA: Diagnosis not present

## 2019-02-08 DIAGNOSIS — M21371 Foot drop, right foot: Secondary | ICD-10-CM

## 2019-02-08 DIAGNOSIS — I69351 Hemiplegia and hemiparesis following cerebral infarction affecting right dominant side: Secondary | ICD-10-CM

## 2019-02-08 DIAGNOSIS — R293 Abnormal posture: Secondary | ICD-10-CM

## 2019-02-08 DIAGNOSIS — R262 Difficulty in walking, not elsewhere classified: Secondary | ICD-10-CM

## 2019-02-08 DIAGNOSIS — R29818 Other symptoms and signs involving the nervous system: Secondary | ICD-10-CM

## 2019-02-08 NOTE — Therapy (Signed)
[facility] [address] [place], [place], [address] Phone: [phone]   Fax:  [phone]  Physical Therapy Treatment  Patient Details  Name: Holly Flores MRN: [id number] Date of Birth: [birth year] Referring Provider (PT): Drue Second, FNP   Encounter Date: 02/08/2019  PT End of Session - 02/08/19 1354    Visit Number  36    Number of Visits  23    Date for PT Re-Evaluation  02/18/19    Authorization Type  Progress note every 10th visit; KX modifier at 15th visit.    PT Start Time  1243    PT Stop Time  1330    PT Time Calculation (min)  47 min    Activity Tolerance  Patient tolerated treatment well    Behavior During Therapy  WFL for tasks assessed/performed       Past Medical History:  Diagnosis Date  . Anxiety   . Arthritis    "all over" (12/20/2017)  . Central retinal vein occlusion of left eye 01/22/2015  . CHF (congestive heart failure) ([place])   . Chronic bronchitis ([place])    "get it most years" (12/20/2017)  . Chronic lower back pain   . CKD (chronic kidney disease), stage II   . COPD (chronic obstructive pulmonary disease) ([place])   . CVA (cerebral vascular accident) ([place]) 12/30/2014   OCULAR  LEFT  EYE   . Depression   . Fibromyalgia   . GERD (gastroesophageal reflux disease)   . Goiter   . History of colonic polyps 05/14/2014  . Hyperlipidemia   . Hypertension   . Hypothyroidism   . Kidney cysts    left side  . 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 Migraine    "couple times/wk; been having them for a long time" (12/20/2017)  . Myocardial infarction [facility])    "one dr said I did; one said I didn't;  don't remember when it was" (12/20/2017)  . 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 On home oxygen therapy    "3L; sleep w/it q night" (12/20/2017)  . Pneumonia    "several times" (12/20/2017)  . Shortness of breath   . Sleep apnea    "stopped mask  when I got on the oxygen" (12/20/2017)  . Stroke [facility])     Past Surgical History:  Procedure Laterality Date  . ANTERIOR CERVICAL DECOMP/DISCECTOMY FUSION    . BACK SURGERY    . BREAST BIOPSY     left-non cancerous  . CARDIAC CATHETERIZATION    . CARPAL TUNNEL RELEASE Left   . CATARACT EXTRACTION W/PHACO  09/21/2011   Procedure: CATARACT EXTRACTION PHACO AND INTRAOCULAR LENS PLACEMENT (IOC);  Surgeon: Williams Che, MD;  Location: [facility];  Service: Ophthalmology;  Laterality: Left;  CDE:9.78  . CATARACT EXTRACTION W/PHACO Right 10/15/2014   Procedure: CATARACT EXTRACTION PHACO AND INTRAOCULAR LENS PLACEMENT ([place]);  Surgeon: Williams Che, MD;  Location: [facility];  Service: Ophthalmology;  Laterality: Right;  CDE:4.19  . [place]; 1972  . CHOLECYSTECTOMY OPEN    . COLONOSCOPY  2006   RMR: 1. Internal hemorroids, otherwise normal rectum. 2. Pedunculated polyp at 35 cm. reomved with snare. The remainder of teh colonic mucosa appeared normal.   . COLONOSCOPY  2011   Dr. Gala Romney: multiple ascending colon polyps and rectal polyp, adenomatous  . COLONOSCOPY N/A 05/31/2014   Procedure: COLONOSCOPY;  Surgeon: Daneil Dolin, MD;  Location:  [facility];  Service: Endoscopy;  Laterality: N/A;  130  . COLONOSCOPY WITH PROPOFOL N/A 06/24/2017   Procedure: COLONOSCOPY WITH PROPOFOL;  Surgeon: Daneil Dolin, MD;  Location: [facility];  Service: Endoscopy;  Laterality: N/A;  2:15pm  . ESOPHAGOGASTRODUODENOSCOPY  2006   Dr. Gala Romney: Subtle Schatzkis ring, otherwise normal upper GI tract, aside from a small pyloric channell erosion, status post dilation as described above.   Marland Kitchen FLEXIBLE SIGMOIDOSCOPY N/A 11/11/2017   Procedure: FLEXIBLE SIGMOIDOSCOPY;  Surgeon: Otis Brace, MD;  Location: [facility];  Service: Gastroenterology;  Laterality: N/A;  . JOINT REPLACEMENT    . LUMBAR DISC SURGERY  X 2  . POLYPECTOMY  06/24/2017   Procedure: POLYPECTOMY;  Surgeon: Daneil Dolin, MD;   Location: [facility];  Service: Endoscopy;;  ascending  . POSTERIOR LUMBAR FUSION  ? date 1st fusion ; 07/2006   previous L4-5 Ray threaded fusion cage; Exploration of L4-5 fusion & PLIF 07/22/2006/notes 07/22/2006  . TONSILLECTOMY    . TOTAL KNEE ARTHROPLASTY Right 05/24/2013   Procedure: TOTAL KNEE ARTHROPLASTY;  Surgeon: [name], MD;  Location: [facility];  Service: Orthopedics;  Laterality: Right;  . TOTAL KNEE ARTHROPLASTY Left   . VAGINAL HYSTERECTOMY      There were no vitals filed for this visit.  Subjective Assessment - 02/08/19 1252    Subjective  Still having back pain from UTI; but it is getting better.  Discussed with patient the response from [employer] about the lift.  Also informed pt of date for w/c evaluation.  AFO is in her other car, does not have it today.    Patient is accompained by:  Family member   son   Pertinent History  left sided CVA 10/29/17, Chronic CHF, AFib, COPD, Chronic Kidney Disease stage III, hx of bil TKA, Lumbar surgery    Limitations  Lifting;Standing;Walking;House hold activities    Diagnostic tests  MRI: left external capsule CVA    Patient Stated Goals  move around better in the home and improve function    Currently in Pain?  Yes                       [facility] Adult PT Treatment/Exercise - 02/08/19 1343      Transfers   Transfers  Sit to Stand;Stand to [employer] Transfers    Sit to Stand  4: Min assist;3: Mod assist    Sit to Stand Details (indicate cue type and reason)  initially mod A to stand from w/c due to increased R lateral lean and lateropulsion to R; following NMR and use of mirror for visual feedback of head and trunk position pt able to progress to min A to stand    Stand to Sit  4: Min assist;3: Mod assist    Stand to Sit Details  verbal cues for full pivoting prior to sitting; when fatigued pt required mod A to control descent into chair    Stand Pivot Transfers  3: Mod assist    Stand Pivot Transfer Details  (indicate cue type and reason)  with RW (no AFO today so therapist had to assist with RLE advancement, placement and stabilization today); performed to L and R w/c <> Nustep and w/c <> mat.  Focused on maintaining head and trunk in midline and active weight shifting (with trunk elongation) to L side to unweight and advance RLE forwards, laterally and back.  During R stance therapist provided stabilization through R  knee.      Neuro Re-ed    Neuro Re-ed Details   Seated on wedge at edge of mat with UE supported on RW in front and mirror in front of patient for visual feedback.  Therapist provided external support and facilitation for RLE extension activation to prevent sliding forwards.  Use of wedge for anterior weight shift over BOS and promote upright trunk.  Focused on active weight shift to L with use of L trunk elongation, R trunk shortening while maintaining head in midline.  Added to upright trunk lifting L heel > lifting L foot off floor for increased activation through R side x 5 reps.  Performed one sit > stand from wedge with min A to remove wedge due to LE fatigue and increase in back pain.      Lumbar Exercises: Aerobic   Nustep  Level 4 and then up to level 5 resistance x 5 minutes with bilat LE only for reciprocal LE extension training with therapist assisting with maintaining R hip internal rotation             PT Education - 02/08/19 1353    Education Details  Relayed information from email from [employer] regarding [place] standing aide: [employer] alerted PT that insurance would deny standing assist equipment and when family was notified, they requested to send the lift back.  Pt did not wish to continue to pursue it.  Alerted pt to date and time of power wheelchair evaluation.    Person(s) Educated  Patient    Methods  Explanation    Comprehension  Verbalized understanding       PT Short Term Goals - 02/02/19 1604      PT SHORT TERM GOAL #1   Title  Pt and family will be  independent with supine, seated and standing HEP. ALL STGS DUE 01/26/19    Baseline  02/02/19: dependent to date    Time  4    Period  Weeks    Status  On-going    Target Date  01/26/19      PT SHORT TERM GOAL #2   Title  Patient will ambulate 93' with mod A of one therapist. with RW and R AFO.    Baseline  02/02/19: pt does not have larger shoe to fit her AFO yet    Time  4    Period  Weeks    Status  Unable to assess    Target Date  01/26/19      PT SHORT TERM GOAL #3   Title  Patient will perform sit <> stand from mat table with R AFO and RW with mod A of one therapist.    Baseline  02/02/19: pt does not have larger shoe to fit her AFO yet    Time  4    Period  Weeks    Status  Unable to assess    Target Date  01/26/19        PT Long Term Goals - 12/29/18 1159      PT LONG TERM GOAL #1   Title  Patient and family will be independent with final HEP recommendations. ALL LTGS DUE 02/23/19    Baseline  pt reports that she is trying to perform HEP at home when she can.    Time  8    Period  Weeks    Status  On-going    Target Date  02/23/19      PT LONG TERM GOAL #2  Title  Patient will perform sit <> stand from wheelchair with min A and transfer stand pivot with RW and AFO with Mod A of one person    Baseline  slow to progress due to pt not having the proper shoe size to wear R AFO    Time  8    Period  Weeks    Status  On-going    Target Date  02/23/19      PT LONG TERM GOAL #3   Title  Pt will perform bed mobility on flat mat with min A    Time  8    Period  Weeks    Status  On-going    Target Date  02/23/19      PT LONG TERM GOAL #4   Title  Family will demonstrate independent ability to don appropriate AFO with R shoe    Baseline  pt needing max A from therapist to don R AFO due to improper shoe size/width    Time  8    Period  Weeks    Status  On-going    Target Date  02/23/19      PT LONG TERM GOAL #5   Title  Patient will ambulate 40 feet or greater  with min A, AFO, and LRAD to safely navigate around home.    Baseline  slow to progress due to pt not having the proper shoe size to wear R AFO    Time  8    Period  Weeks    Status  On-going    Target Date  02/23/19            Plan - 02/08/19 1355    Clinical Impression Statement  Pt did not bring AFO today (advised pt therapy can store in office so that we have it for therapy until shoes come in), therefore, focused on stand pivot transfer sequencing with RW, LE extensor strengthening and NMR for head and trunk control to improve safety and independence with sit <> stand and stand pivot transfers.  Will continue to address in order to progress towards LTG.    Personal Factors and Comorbidities  Age;Comorbidity 3+;Time since onset of injury/illness/exacerbation    Examination-Activity Limitations  Lift;Dressing;Self Feeding;Transfers;Toileting;Stand;Locomotion Level;Reach Overhead;Bed Mobility    Rehab Potential  Fair    PT Frequency  2x / week    PT Duration  8 weeks    PT Treatment/Interventions  ADLs/Self Care Home Management;Electrical Stimulation;Gait training;Stair training;Functional mobility training;Balance training;Therapeutic exercise;Therapeutic activities;Neuromuscular re-education;Wheelchair mobility training;Manual techniques;Passive range of motion;Patient/family education;DME Instruction;Energy conservation;Orthotic Fit/Training    PT Next Visit Plan  MONITOR BP EVERY SESSION WITH MANUAL CUFF ON LUE;  Nustep with LE only: RLE strengthening into extension.  When larger shoe comes in: Training with family - donning AFO, standing aide, wearing schedule.  Stand pivot L and R with RW and AFO. gait with RW. Nustep with R hand orthosis. Seated mirror therapy for LE. Stedy transfers and sit <> stand on Stedy without UE support. Seated on large wedge, rockerboard or inverted BOSU performing lateral tilts or anterior/posterior for sit <> stand. Work on bed mobility on flat bed. Add to  HEP: WB exercises for mm activation    Recommended Other Services  W/C eval scheduled for 1/11 with Allison and Agree with Plan of Care  Patient       Patient will benefit from skilled therapeutic intervention in order to improve the following deficits and impairments:  Pain,  Postural dysfunction, Impaired tone, Decreased mobility, Decreased activity tolerance, Decreased range of motion, Decreased strength, Impaired UE functional use, Difficulty walking, Decreased balance, Abnormal gait, Decreased endurance  Visit Diagnosis: Muscle weakness (generalized)  Other symptoms and signs involving the nervous system  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 Acute CVA (cerebrovascular accident) ([place]) 10/01/2018  . Physical deconditioning 12/19/2017  . Chronic kidney disease (CKD), stage III (moderate)   . Hypoalbuminemia due to protein-calorie malnutrition ([place])   . Intractable migraine without status migrainosus   . Reactive depression   . Hemiparesis affecting right side as late effect of stroke ([place])   . Slow transit constipation   . PAF (paroxysmal atrial fibrillation) ([place])   . Dysphasia, post-stroke   . COPD (chronic obstructive pulmonary disease) ([place]) 10/29/2017  . Chronic diastolic CHF (congestive heart failure) ([place]) 10/29/2017  . Hypothyroidism 10/29/2017  . Essential hypertension 10/29/2017  . GERD (gastroesophageal reflux disease) 10/29/2017  . Anxiety 10/29/2017  . 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 Hyperlipidemia 04/20/2013    Rico Junker, PT, DPT 02/08/19    2:03 PM    [facility] [address] [place], [place], [address] Phone: [phone]   Fax:   [phone]  Name: HARMONEY MCLELLAN MRN: [id number] Date of Birth: 19-Jan-1946

## 2019-02-14 ENCOUNTER — Other Ambulatory Visit: Payer: Self-pay

## 2019-02-14 ENCOUNTER — Encounter: Payer: Self-pay | Admitting: Physical Therapy

## 2019-02-14 ENCOUNTER — Encounter: Payer: Medicare Other | Admitting: Occupational Therapy

## 2019-02-14 ENCOUNTER — Ambulatory Visit: Payer: Medicare Other | Admitting: Physical Therapy

## 2019-02-14 DIAGNOSIS — R2689 Other abnormalities of gait and mobility: Secondary | ICD-10-CM

## 2019-02-14 DIAGNOSIS — M6281 Muscle weakness (generalized): Secondary | ICD-10-CM | POA: Diagnosis not present

## 2019-02-14 DIAGNOSIS — I69351 Hemiplegia and hemiparesis following cerebral infarction affecting right dominant side: Secondary | ICD-10-CM

## 2019-02-14 NOTE — Therapy (Signed)
[facility] [address] [place], [place], [address] Phone: [phone]   Fax:  [phone]  Physical Therapy Treatment  Patient Details  Name: Holly Flores MRN: [id number] Date of Birth: 1945/05/06 Referring Provider (PT): Drue Second, FNP   Encounter Date: 02/14/2019  PT End of Session - 02/14/19 1941    Visit Number  37    Number of Visits  83    Date for PT Re-Evaluation  02/18/19    Authorization Type  Progress note every 10th visit; KX modifier at 15th visit.    PT Start Time  1405    PT Stop Time  1447    PT Time Calculation (min)  42 min    Equipment Utilized During Treatment  Gait belt    Activity Tolerance  Patient tolerated treatment well    Behavior During Therapy  WFL for tasks assessed/performed       Past Medical History:  Diagnosis Date  . Anxiety   . Arthritis    "all over" (12/20/2017)  . Central retinal vein occlusion of left eye 01/22/2015  . CHF (congestive heart failure) ([place])   . Chronic bronchitis ([place])    "get it most years" (12/20/2017)  . Chronic lower back pain   . CKD (chronic kidney disease), stage II   . COPD (chronic obstructive pulmonary disease) ([place])   . CVA (cerebral vascular accident) ([place]) 12/30/2014   OCULAR  LEFT  EYE   . Depression   . Fibromyalgia   . GERD (gastroesophageal reflux disease)   . Goiter   . History of colonic polyps 05/14/2014  . Hyperlipidemia   . Hypertension   . Hypothyroidism   . Kidney cysts    left side  . 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 Migraine    "couple times/wk; been having them for a long time" (12/20/2017)  . Myocardial infarction [facility]    "one dr said I did; one said I didn't;  don't remember when it was" (12/20/2017)  . 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 On home oxygen therapy    "3L; sleep w/it q night" (12/20/2017)  . Pneumonia    "several times" (12/20/2017)  .  Shortness of breath   . Sleep apnea    "stopped mask when I got on the oxygen" (12/20/2017)  . Stroke [facility])     Past Surgical History:  Procedure Laterality Date  . ANTERIOR CERVICAL DECOMP/DISCECTOMY FUSION    . BACK SURGERY    . BREAST BIOPSY     left-non cancerous  . CARDIAC CATHETERIZATION    . CARPAL TUNNEL RELEASE Left   . CATARACT EXTRACTION W/PHACO  09/21/2011   Procedure: CATARACT EXTRACTION PHACO AND INTRAOCULAR LENS PLACEMENT (IOC);  Surgeon: Williams Che, MD;  Location: [facility];  Service: Ophthalmology;  Laterality: Left;  CDE:9.78  . CATARACT EXTRACTION W/PHACO Right 10/15/2014   Procedure: CATARACT EXTRACTION PHACO AND INTRAOCULAR LENS PLACEMENT ([place]);  Surgeon: Williams Che, MD;  Location: [facility];  Service: Ophthalmology;  Laterality: Right;  CDE:4.19  . [place]; 1972  . CHOLECYSTECTOMY OPEN    . COLONOSCOPY  2006   RMR: 1. Internal hemorroids, otherwise normal rectum. 2. Pedunculated polyp at 35 cm. reomved with snare. The remainder of teh colonic mucosa appeared normal.   . COLONOSCOPY  2011   Dr. Gala Romney: multiple ascending colon polyps and rectal polyp, adenomatous  . COLONOSCOPY N/A 05/31/2014  Procedure: COLONOSCOPY;  Surgeon: Daneil Dolin, MD;  Location: [facility];  Service: Endoscopy;  Laterality: N/A;  130  . COLONOSCOPY WITH PROPOFOL N/A 06/24/2017   Procedure: COLONOSCOPY WITH PROPOFOL;  Surgeon: Daneil Dolin, MD;  Location: [facility];  Service: Endoscopy;  Laterality: N/A;  2:15pm  . ESOPHAGOGASTRODUODENOSCOPY  2006   Dr. Gala Romney: Subtle Schatzkis ring, otherwise normal upper GI tract, aside from a small pyloric channell erosion, status post dilation as described above.   Marland Kitchen FLEXIBLE SIGMOIDOSCOPY N/A 11/11/2017   Procedure: FLEXIBLE SIGMOIDOSCOPY;  Surgeon: Otis Brace, MD;  Location: [facility];  Service: Gastroenterology;  Laterality: N/A;  . JOINT REPLACEMENT    . LUMBAR DISC SURGERY  X 2  . POLYPECTOMY  06/24/2017    Procedure: POLYPECTOMY;  Surgeon: Daneil Dolin, MD;  Location: [facility];  Service: Endoscopy;;  ascending  . POSTERIOR LUMBAR FUSION  ? date 1st fusion ; 07/2006   previous L4-5 Ray threaded fusion cage; Exploration of L4-5 fusion & PLIF 07/22/2006/notes 07/22/2006  . TONSILLECTOMY    . TOTAL KNEE ARTHROPLASTY Right 05/24/2013   Procedure: TOTAL KNEE ARTHROPLASTY;  Surgeon: [name], MD;  Location: [facility];  Service: Orthopedics;  Laterality: Right;  . TOTAL KNEE ARTHROPLASTY Left   . VAGINAL HYSTERECTOMY      There were no vitals filed for this visit.  Subjective Assessment - 02/14/19 1821    Subjective  Pt reports her AFO is still in the other car (does not have it today) and states the larger shoe still has not come in yet; brakes on wheelchair are broken - pt states they have been "acting funny for a few days" but actually stopped working today - informed pt to contact [facility] for repair as pt states she received the wheelchair from the hospital    Patient is accompained by:  Family member   son   Pertinent History  left sided CVA 10/29/17, Chronic CHF, AFib, COPD, Chronic Kidney Disease stage III, hx of bil TKA, Lumbar surgery    Limitations  Lifting;Standing;Walking;House hold activities    Diagnostic tests  MRI: left external capsule CVA    Patient Stated Goals  move around better in the home and improve function    Currently in Pain?  No/denies                       [facility] Adult PT Treatment/Exercise - 02/14/19 1424      Bed Mobility   Bed Mobility  Sit to Supine    Sit to Supine  Minimal Assistance - Patient > 75%   needs assist to transfer LE's onto mat     Transfers   Transfers  Squat Pivot Transfers;Sit to Stand    Sit to Stand  4: Min assist   from mat to hemiwalker   Stand to Sit  4: Min assist   verbal cues to reach back for mat   Squat Pivot Transfers  3: Mod assist   toward Lt side; 2nd person held w/c      Ambulation/Gait   Gait  Comments  pt stood from mat to hemiwalker with min to mod assist; pt attempted to step forward with RLE but unable to advance leg; Rt knee instability was noted with weight shift onto RLE (due to quad weakness)      Exercises   Exercises  Knee/Hip      Knee/Hip Exercises: Aerobic   Nustep  LE's only -  level 2 x 5" with assist to keep RLE adducted during extension      Knee/Hip Exercises: Seated   Long Arc Quad  Right;AAROM;1 set;10 reps      Knee/Hip Exercises: Supine   Heel Slides  AAROM;Right;1 set;10 reps    Bridges  AROM;Both;1 set;10 reps    Straight Leg Raises  AAROM;Right;1 set;10 reps    Other Supine Knee/Hip Exercises  Rt hip abdct/adduction in hooklying position 10 reps with CGA:  Rt hip flexion/extension in hooklying position 10 reps with min assist              PT Education - 02/14/19 1937    Education Details  informed pt and caregiver (lady waiting on pt in lobby at end of PT session) of need to get brakes fixed on her wheelchair as soon as possible due to safety concerns, esp. during transfers    Person(s) Educated  Patient;Caregiver(s)    Methods  Explanation    Comprehension  Verbalized understanding       PT Short Term Goals - 02/14/19 1945      PT SHORT TERM GOAL #1   Title  Pt and family will be independent with supine, seated and standing HEP. ALL STGS DUE 01/26/19    Baseline  02/02/19: dependent to date    Time  4    Period  Weeks    Status  On-going    Target Date  01/26/19      PT SHORT TERM GOAL #2   Title  Patient will ambulate 83' with mod A of one therapist. with RW and R AFO.    Baseline  02/02/19: pt does not have larger shoe to fit her AFO yet    Time  4    Period  Weeks    Status  Unable to assess    Target Date  01/26/19      PT SHORT TERM GOAL #3   Title  Patient will perform sit <> stand from mat table with R AFO and RW with mod A of one therapist.    Baseline  02/02/19: pt does not have larger shoe to fit her AFO yet    Time   4    Period  Weeks    Status  Unable to assess    Target Date  01/26/19        PT Long Term Goals - 02/14/19 1946      PT LONG TERM GOAL #1   Title  Patient and family will be independent with final HEP recommendations. ALL LTGS DUE 02/23/19    Baseline  pt reports that she is trying to perform HEP at home when she can.    Time  8    Period  Weeks    Status  On-going      PT LONG TERM GOAL #2   Title  Patient will perform sit <> stand from wheelchair with min A and transfer stand pivot with RW and AFO with Mod A of one person    Baseline  slow to progress due to pt not having the proper shoe size to wear R AFO    Time  8    Period  Weeks    Status  On-going      PT LONG TERM GOAL #3   Title  Pt will perform bed mobility on flat mat with min A    Time  8    Period  Weeks    Status  On-going      PT LONG TERM GOAL #4   Title  Family will demonstrate independent ability to don appropriate AFO with R shoe    Baseline  pt needing max A from therapist to don R AFO due to improper shoe size/width    Time  8    Period  Weeks    Status  On-going      PT LONG TERM GOAL #5   Title  Patient will ambulate 40 feet or greater with min A, AFO, and LRAD to safely navigate around home.    Baseline  slow to progress due to pt not having the proper shoe size to wear R AFO    Time  8    Period  Weeks    Status  On-going            Plan - 02/14/19 1942    Clinical Impression Statement  Pt did not bring AFO to therapy today - still states that the brace is in the other car.  Pt appears to be making minimal progress as she requires mod assist for transfers and min assist with bed mobility.  Pt also states that the larger shoe still has not arrived - pt is nonambulatory at this time.    Personal Factors and Comorbidities  Age;Comorbidity 3+;Time since onset of injury/illness/exacerbation    Examination-Activity Limitations  Lift;Dressing;Self Feeding;Transfers;Toileting;Stand;Locomotion  Level;Reach Overhead;Bed Mobility    Rehab Potential  Fair    PT Frequency  2x / week    PT Duration  8 weeks    PT Treatment/Interventions  ADLs/Self Care Home Management;Electrical Stimulation;Gait training;Stair training;Functional mobility training;Balance training;Therapeutic exercise;Therapeutic activities;Neuromuscular re-education;Wheelchair mobility training;Manual techniques;Passive range of motion;Patient/family education;DME Instruction;Energy conservation;Orthotic Fit/Training    PT Next Visit Plan  MONITOR BP EVERY SESSION WITH MANUAL CUFF ON LUE;  Nustep with LE only: RLE strengthening into extension.  When larger shoe comes in: Training with family - donning AFO, standing aide, wearing schedule.  Stand pivot L and R with RW and AFO. gait with RW. Nustep with R hand orthosis. Seated mirror therapy for LE. Stedy transfers and sit <> stand on Stedy without UE support. Seated on large wedge, rockerboard or inverted BOSU performing lateral tilts or anterior/posterior for sit <> stand. Work on bed mobility on flat bed. 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 Acute CVA (cerebrovascular accident) ([place]) 10/01/2018  . Physical deconditioning 12/19/2017  . Chronic kidney disease (CKD), stage III (moderate)   . Hypoalbuminemia  due to protein-calorie malnutrition ([place])   . Intractable migraine without status migrainosus   . Reactive depression   . Hemiparesis affecting right side as late effect of stroke ([place])   . Slow transit constipation   . PAF (paroxysmal atrial fibrillation) ([place])   . Dysphasia, post-stroke   . COPD (chronic obstructive pulmonary disease) ([place]) 10/29/2017  . Chronic diastolic  CHF (congestive heart failure) ([place]) 10/29/2017  . Hypothyroidism 10/29/2017  . Essential hypertension 10/29/2017  . GERD (gastroesophageal reflux disease) 10/29/2017  . Anxiety 10/29/2017  . 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 Hyperlipidemia 04/20/2013    Alda Lea, PT 02/14/2019, 7:48 PM  [facility] [address] [place], [place], [address] Phone: [phone]   Fax:  [phone]  Name: Holly Flores MRN: [id number] Date of Birth: 1945-12-27

## 2019-02-16 ENCOUNTER — Ambulatory Visit: Payer: Medicare Other

## 2019-02-16 ENCOUNTER — Encounter: Payer: Medicare Other | Admitting: Occupational Therapy

## 2019-02-16 ENCOUNTER — Other Ambulatory Visit: Payer: Self-pay

## 2019-02-16 DIAGNOSIS — M6281 Muscle weakness (generalized): Secondary | ICD-10-CM | POA: Diagnosis not present

## 2019-02-16 DIAGNOSIS — R2689 Other abnormalities of gait and mobility: Secondary | ICD-10-CM

## 2019-02-16 NOTE — Therapy (Signed)
[facility] [address] [place], [place], [address] Phone: [phone]   Fax:  [phone]  Patient Details  Name: Holly Flores MRN: [id number] Date of Birth: July 28, 1945 Referring Provider:  No ref. provider found  Encounter Date: 02/16/2019  OCCUPATIONAL THERAPY DISCHARGE SUMMARY  Visits from Start of Care: 14  Current functional level related to goals / functional outcomes: OT Short Term Goals - 11/22/18 1418      OT SHORT TERM GOAL #1   Title  Independent with HEP for RUE (Caregiver assisted and self ROM as able)    Time  4    Period  Weeks    Status  Achieved      OT SHORT TERM GOAL #2   Title  Pt/family to verbalize understanding with A/E (rocker knife, adapted shoes, extended brush, LH sponge, etc) to increase independence with BADLS    Time  4    Period  Weeks    Status  Achieved      OT SHORT TERM GOAL #3   Title  Pt/family to verbalize understanding with use of stand assist (Steady) with toileting and transfers to decrease burden of care on caregiver    Time  4    Period  Weeks    Status  Achieved      OT SHORT TERM GOAL #4   Title  Pt to demo sliding board transfers consistently w/ mod assist or less for level transfer from w/c to/from bed    Time  4    Period  Weeks    Status  Achieved      OT Long Term Goals - 12/06/18 1257      OT LONG TERM GOAL #1   Title  Pt to demo independence with HEP for Lt shoulder (once pt seen by ortho MD)    Time  8    Period  Weeks    Status  Deferred   Pt has not seen ortho MD yet     OT LONG TERM GOAL #2   Title  Pt to be min assist for UE bathing, and mod assist or less for UE dressing    Time  4    Period  Weeks    Status  Revised      OT LONG TERM GOAL #3   Title  Pt to cut food and brush hair w/ A/E independently    Time  8    Period  Weeks    Status  Partially Met   needs to obtain long handled/extended brush for independence for bruhsing  hair     OT LONG TERM GOAL #4   Title  Pt to perform toilet transfer w/ mod assist using correct DME prn    Time  8    Period  Weeks    Status  Achieved      OT LONG TERM GOAL #5   Title  Pt to perform LE dressing at max assist, LE bathing at min assist w/ use of LH sponge    Time  4    Period  Weeks    Status unknown     OT LONG TERM GOAL #6   Title  Pt to perform stand pivot transfer w/ close supervision only and demo standing ability for caregiver to perform clothes management for toileting and transfers to [facility]    Time  4    Period  Weeks    Status  Deferred   Pt will  be getting steady assist lift for this     OT LONG TERM GOAL #7   Title  Pt to demo adequate trunk control to simulate wiping and LE dressing w/o LOB    Time  4    Period  Weeks    Status  Achieved        Remaining deficits: Mobility RUE dense hemiplegia   Education / Equipment: HEP's, A/E and DME recommendations  Plan: Patient agrees to discharge.  Patient goals were partially met. Patient is being discharged due to meeting the stated rehab goals.  And unable to attend last session????        Carey Bullocks, OTR/L 02/16/2019, 3:32 PM  [facility] [address] [place], [place], [address] Phone: [phone]   Fax:  [phone]

## 2019-02-16 NOTE — Therapy (Signed)
[facility] [address] [place], [place], [address] Phone: [phone]   Fax:  [phone]  Physical Therapy Treatment  Patient Details  Name: Holly Flores MRN: [id number] Date of Birth: 15-Sep-1945 Referring Provider (PT): Drue Second, FNP   Encounter Date: 02/16/2019  PT End of Session - 02/16/19 1402    Visit Number  38    Number of Visits  9    Date for PT Re-Evaluation  02/18/19    Authorization Type  Progress note every 10th visit; KX modifier at 15th visit.    PT Start Time  1400    PT Stop Time  1440    PT Time Calculation (min)  40 min    Equipment Utilized During Treatment  Gait belt    Activity Tolerance  Patient tolerated treatment well    Behavior During Therapy  WFL for tasks assessed/performed       Past Medical History:  Diagnosis Date  . Anxiety   . Arthritis    "all over" (12/20/2017)  . Central retinal vein occlusion of left eye 01/22/2015  . CHF (congestive heart failure) ([place])   . Chronic bronchitis ([place])    "get it most years" (12/20/2017)  . Chronic lower back pain   . CKD (chronic kidney disease), stage II   . COPD (chronic obstructive pulmonary disease) ([place])   . CVA (cerebral vascular accident) ([place]) 12/30/2014   OCULAR  LEFT  EYE   . Depression   . Fibromyalgia   . GERD (gastroesophageal reflux disease)   . Goiter   . History of colonic polyps 05/14/2014  . Hyperlipidemia   . Hypertension   . Hypothyroidism   . Kidney cysts    left side  . 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 Migraine    "couple times/wk; been having them for a long time" (12/20/2017)  . Myocardial infarction [facility])    "one dr said I did; one said I didn't;  don't remember when it was" (12/20/2017)  . 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 On home oxygen therapy    "3L; sleep w/it q night" (12/20/2017)  . Pneumonia    "several times" (12/20/2017)  .  Shortness of breath   . Sleep apnea    "stopped mask when I got on the oxygen" (12/20/2017)  . Stroke [facility])     Past Surgical History:  Procedure Laterality Date  . ANTERIOR CERVICAL DECOMP/DISCECTOMY FUSION    . BACK SURGERY    . BREAST BIOPSY     left-non cancerous  . CARDIAC CATHETERIZATION    . CARPAL TUNNEL RELEASE Left   . CATARACT EXTRACTION W/PHACO  09/21/2011   Procedure: CATARACT EXTRACTION PHACO AND INTRAOCULAR LENS PLACEMENT (IOC);  Surgeon: Williams Che, MD;  Location: [facility];  Service: Ophthalmology;  Laterality: Left;  CDE:9.78  . CATARACT EXTRACTION W/PHACO Right 10/15/2014   Procedure: CATARACT EXTRACTION PHACO AND INTRAOCULAR LENS PLACEMENT ([place]);  Surgeon: Williams Che, MD;  Location: [facility];  Service: Ophthalmology;  Laterality: Right;  CDE:4.19  . [place]; 1972  . CHOLECYSTECTOMY OPEN    . COLONOSCOPY  2006   RMR: 1. Internal hemorroids, otherwise normal rectum. 2. Pedunculated polyp at 35 cm. reomved with snare. The remainder of teh colonic mucosa appeared normal.   . COLONOSCOPY  2011   Dr. Gala Romney: multiple ascending colon polyps and rectal polyp, adenomatous  . COLONOSCOPY N/A 05/31/2014  Procedure: COLONOSCOPY;  Surgeon: Daneil Dolin, MD;  Location: [facility];  Service: Endoscopy;  Laterality: N/A;  130  . COLONOSCOPY WITH PROPOFOL N/A 06/24/2017   Procedure: COLONOSCOPY WITH PROPOFOL;  Surgeon: Daneil Dolin, MD;  [facility];  Service: Endoscopy;  Laterality: N/A;  2:15pm  . ESOPHAGOGASTRODUODENOSCOPY  2006   Dr. Gala Romney: Subtle Schatzkis ring, otherwise normal upper GI tract, aside from a small pyloric channell erosion, status post dilation as described above.   Marland Kitchen FLEXIBLE SIGMOIDOSCOPY N/A 11/11/2017   Procedure: FLEXIBLE SIGMOIDOSCOPY;  Surgeon: Otis Brace, MD;  Location: [facility];  Service: Gastroenterology;  Laterality: N/A;  . JOINT REPLACEMENT    . LUMBAR DISC SURGERY  X 2  . POLYPECTOMY  06/24/2017    Procedure: POLYPECTOMY;  Surgeon: Daneil Dolin, MD;  [facility];  Service: Endoscopy;;  ascending  . POSTERIOR LUMBAR FUSION  ? date 1st fusion ; 07/2006   previous L4-5 Ray threaded fusion cage; Exploration of L4-5 fusion & PLIF 07/22/2006/notes 07/22/2006  . TONSILLECTOMY    . TOTAL KNEE ARTHROPLASTY Right 05/24/2013   Procedure: TOTAL KNEE ARTHROPLASTY;  Surgeon: [name], MD;  Location: [facility];  Service: Orthopedics;  Laterality: Right;  . TOTAL KNEE ARTHROPLASTY Left   . VAGINAL HYSTERECTOMY      There were no vitals filed for this visit.  Subjective Assessment - 02/16/19 1403    Subjective  Pt did not bring AFO as states she does not have new shoe yet to fit brace in. Son ordered 3 weeks ago and was on back order.    Patient is accompained by:  Family member   son   Pertinent History  left sided CVA 10/29/17, Chronic CHF, AFib, COPD, Chronic Kidney Disease stage III, hx of bil TKA, Lumbar surgery    Limitations  Lifting;Standing;Walking;House hold activities    Diagnostic tests  MRI: left external capsule CVA    Patient Stated Goals  move around better in the home and improve function    Currently in Pain?  No/denies                       [facility] Adult PT Treatment/Exercise - 02/16/19 1425      Bed Mobility   Bed Mobility  Supine to Sit;Rolling Left;Sit to Supine    Rolling Left  Moderate Assistance - Patient 50-74%    Rolling Left Details (indicate cue type and reason)  Pt was instructed to reach across with left arm and bring right arm across body and to help with rolling. PT positioned right leg in flexion to assist.    Left Sidelying to Sit  Minimal Assistance - Patient >75%   Pt was cued to assist right leg off bed with left leg    Sit to Supine  Contact Guard/Touching assist   increased time to perform with lifting right leg with left      Transfers   Transfers  Sit to Stand;Stand to Sit;Stand Pivot Transfers    Sit to Stand  3: Mod  assist;4: Min assist    Stand to Sit  4: Min assist;3: Mod assist    Stand Pivot Transfers  2: Max assist    Stand Pivot Transfer Details (indicate cue type and reason)  w/c to mat towards left with 2nd person stabilizing chair as brakes not working.    Squat Pivot Transfers  --    Lateral/Scoot Transfers  4: Min guard;With slide board;With  armrests removed    Lateral/Scoot Transfer Details (indicate cue type and reason)  PT assisted to place slideboard with patient leaning right on mat but losing balance when performed needing min assist to sit back up. CGA at knees to prevent sliding forward off board with patient scooting to strong left side. Pt was cued to lean forward to help with sliding.    Comments  Pt performed sit to stand x 2 from mat and then x 3 with hemiwalker from mat with PT stabilizing right knee and ankle. Pt was mod assist to rise. Verbal cues to lean forward with transfer and try to hold chest more upright. Mirror in front for visual cues. Pt with pretty heavy right lean. Was cued to tighten right knee to assist some with weight shift left and improved slightly. Sit to stand x 2 from w/c mod assist with verbal cues to lean forwards when rising and when returning to sit to help get bottom back further in chair and control movement more.      Therapeutic Activites    Therapeutic Activities  Other Therapeutic Activities    Other Therapeutic Activities  Sitting edge of mat coming down on left forwarm to the side and pushing back up x 2. Reaching across with left arm and up x 2 to help with left trunk elongation. Pt with limited left shoulder flexion to about 90 degrees both actively and passively.             PT Education - 02/16/19 1500    Education Details  Pt was given number for [employer] to call about w/c brakes. Her husband was able to tighten left brake some.    Person(s) Educated  Patient    Methods  Explanation    Comprehension  Verbalized understanding       PT  Short Term Goals - 02/14/19 1945      PT SHORT TERM GOAL #1   Title  Pt and family will be independent with supine, seated and standing HEP. ALL STGS DUE 01/26/19    Baseline  02/02/19: dependent to date    Time  4    Period  Weeks    Status  On-going    Target Date  01/26/19      PT SHORT TERM GOAL #2   Title  Patient will ambulate 48' with mod A of one therapist. with RW and R AFO.    Baseline  02/02/19: pt does not have larger shoe to fit her AFO yet    Time  4    Period  Weeks    Status  Unable to assess    Target Date  01/26/19      PT SHORT TERM GOAL #3   Title  Patient will perform sit <> stand from mat table with R AFO and RW with mod A of one therapist.    Baseline  02/02/19: pt does not have larger shoe to fit her AFO yet    Time  4    Period  Weeks    Status  Unable to assess    Target Date  01/26/19        PT Long Term Goals - 02/14/19 1946      PT LONG TERM GOAL #1   Title  Patient and family will be independent with final HEP recommendations. ALL LTGS DUE 02/23/19    Baseline  pt reports that she is trying to perform HEP at home when she can.  Time  8    Period  Weeks    Status  On-going      PT LONG TERM GOAL #2   Title  Patient will perform sit <> stand from wheelchair with min A and transfer stand pivot with RW and AFO with Mod A of one person    Baseline  slow to progress due to pt not having the proper shoe size to wear R AFO    Time  8    Period  Weeks    Status  On-going      PT LONG TERM GOAL #3   Title  Pt will perform bed mobility on flat mat with min A    Time  8    Period  Weeks    Status  On-going      PT LONG TERM GOAL #4   Title  Family will demonstrate independent ability to don appropriate AFO with R shoe    Baseline  pt needing max A from therapist to don R AFO due to improper shoe size/width    Time  8    Period  Weeks    Status  On-going      PT LONG TERM GOAL #5   Title  Patient will ambulate 40 feet or greater with min A,  AFO, and LRAD to safely navigate around home.    Baseline  slow to progress due to pt not having the proper shoe size to wear R AFO    Time  8    Period  Weeks    Status  On-going            Plan - 02/16/19 1502    Clinical Impression Statement  Pt continues to not bring AFO to therapy stating she does not have shoe to fit it still. Treatment limited due to this due to weakness in right LE. Pt with heavy lean to right in standing.    Personal Factors and Comorbidities  Age;Comorbidity 3+;Time since onset of injury/illness/exacerbation    Examination-Activity Limitations  Lift;Dressing;Self Feeding;Transfers;Toileting;Stand;Locomotion Level;Reach Overhead;Bed Mobility    Rehab Potential  Fair    PT Frequency  2x / week    PT Duration  8 weeks    PT Treatment/Interventions  ADLs/Self Care Home Management;Electrical Stimulation;Gait training;Stair training;Functional mobility training;Balance training;Therapeutic exercise;Therapeutic activities;Neuromuscular re-education;Wheelchair mobility training;Manual techniques;Passive range of motion;Patient/family education;DME Instruction;Energy conservation;Orthotic Fit/Training    PT Next Visit Plan  MONITOR BP EVERY SESSION WITH MANUAL CUFF ON LUE;  Nustep with LE only: RLE strengthening into extension.  When larger shoe comes in: Training with family - donning AFO, standing aide, wearing schedule.  Stand pivot L and R with RW and AFO. gait with RW. Nustep with R hand orthosis. Seated mirror therapy for LE. Stedy transfers and sit <> stand on Stedy without UE support. Seated on large wedge, rockerboard or inverted BOSU performing lateral tilts or anterior/posterior for sit <> stand. Work on bed mobility on flat bed. Add to HEP: WB exercises for mm activation    Consulted and Agree with Plan of Care  Patient       Patient will benefit from skilled therapeutic intervention in order to improve the following deficits and impairments:  Pain, Postural  dysfunction, Impaired tone, Decreased mobility, Decreased activity tolerance, Decreased range of motion, Decreased strength, Impaired UE functional use, Difficulty walking, Decreased balance, Abnormal gait, Decreased endurance  Visit Diagnosis: Other abnormalities of gait and mobility  Muscle weakness (generalized)     Problem List  Patient Active Problem List   Diagnosis Date Noted  . Acute CVA (cerebrovascular accident) ([place]) 10/01/2018  . Physical deconditioning 12/19/2017  . Chronic kidney disease (CKD), stage III (moderate)   . Hypoalbuminemia due to protein-calorie malnutrition ([place])   . Intractable migraine without status migrainosus   . Reactive depression   . Hemiparesis affecting right side as late effect of stroke ([place])   . Slow transit constipation   . PAF (paroxysmal atrial fibrillation) ([place])   . Dysphasia, post-stroke   . COPD (chronic obstructive pulmonary disease) ([place]) 10/29/2017  . Chronic diastolic CHF (congestive heart failure) ([place]) 10/29/2017  . Hypothyroidism 10/29/2017  . Essential hypertension 10/29/2017  . GERD (gastroesophageal reflux disease) 10/29/2017  . Anxiety 10/29/2017  . 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 Hyperlipidemia 04/20/2013    Electa Sniff, PT, DPT, NCS 02/16/2019, 3:04 PM  [facility] [address] [place], [place], [address] Phone: [phone]   Fax:  [phone]  Name: KAMIL MCNEELEY MRN: [id number] Date of Birth: March 15, 1945

## 2019-02-20 IMAGING — MR MR MRA HEAD W/O CM
10 of 12 series · 32 of 48 positions shown · non-contrast
Comparison: CT head 12/19/2017, MRI head 10/29/2017

CLINICAL DATA: Focal neuro deficit, suspect stroke.  Recent stroke.

EXAM:
MRI HEAD WITHOUT CONTRAST
MRA HEAD WITHOUT CONTRAST
TECHNIQUE: Multiplanar, multiecho pulse sequences of the brain and surrounding
structures were obtained without intravenous contrast. Angiographic
images of the head were obtained using MRA technique without
contrast.

[Series 5: DWI · axial · 4.0mm · 0.88mm/px · z∈[-73,+77]mm · 6 of 78 slices shown (1 of 4)]
[im 1/78]
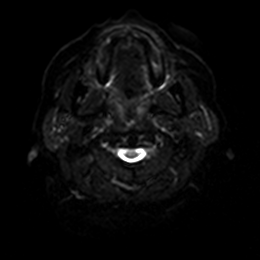
[im 16/78]
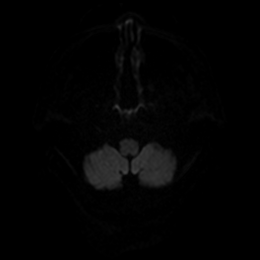
[im 31/78]
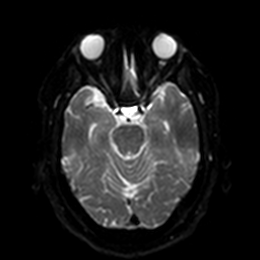
[im 47/78]
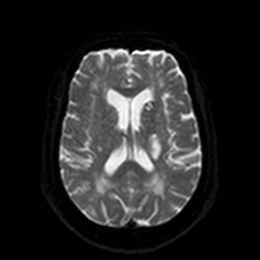
[im 62/78]
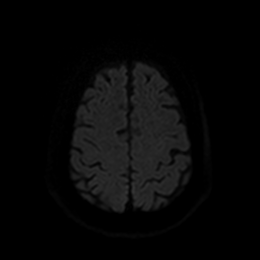
[im 78/78]
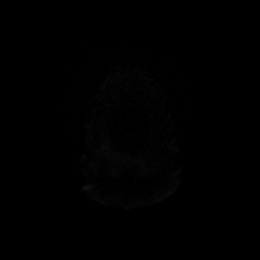

[Series 6: DWI · axial · 4.0mm · 0.88mm/px · z∈[-73,+77]mm · 3 of 39 slices shown (2 of 4)]
[im 1/39]
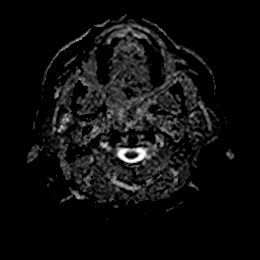
[im 20/39]
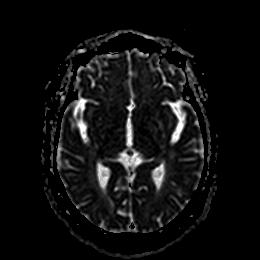
[im 39/39]
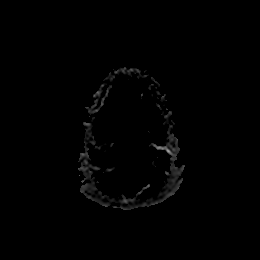

[Series 7: DWI · coronal · 4.0mm · 0.88mm/px · 5 of 74 slices shown (3 of 4)]
[im 1/74]
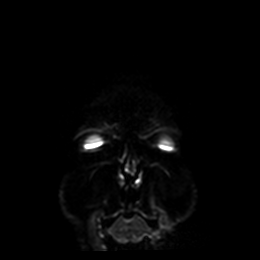
[im 19/74]
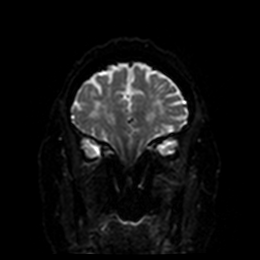
[im 37/74]
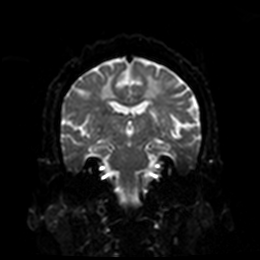
[im 55/74]
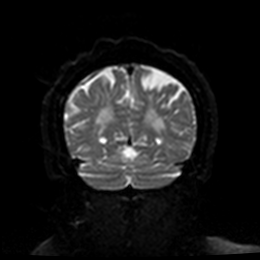
[im 74/74]
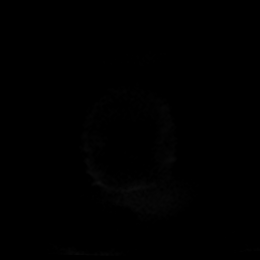

[Series 8: DWI · coronal · 4.0mm · 0.88mm/px · 3 of 37 slices shown (4 of 4)]
[im 1/37]
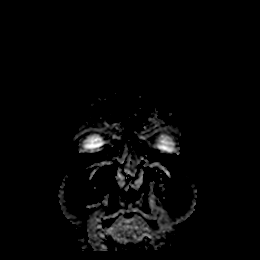
[im 19/37]
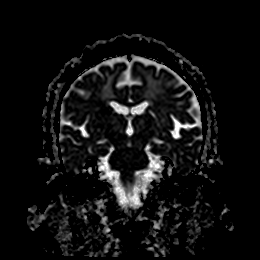
[im 37/37]
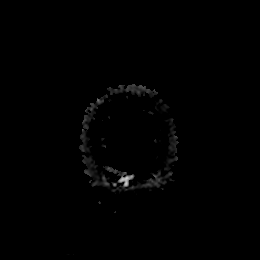

[Series 13: T1 · sagittal · 5.0mm · 0.75mm/px · 1 of 20 slices shown]
[im 1/20]
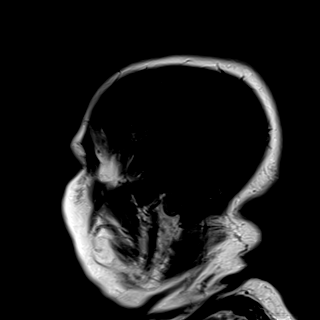

[Series 14: T2 · axial · 5.0mm · 0.72mm/px · z∈[-68,+75]mm · 2 of 25 slices shown (1 of 2)]
[im 1/25]
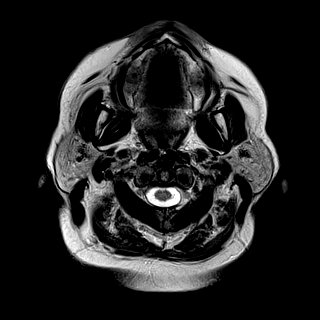
[im 25/25]
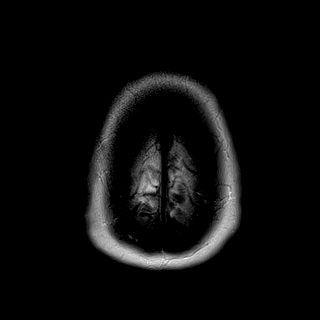

[Series 15: FLAIR · axial · 5.0mm · 0.45mm/px · z∈[-71,+72]mm · 2 of 25 slices shown]
[im 1/25]
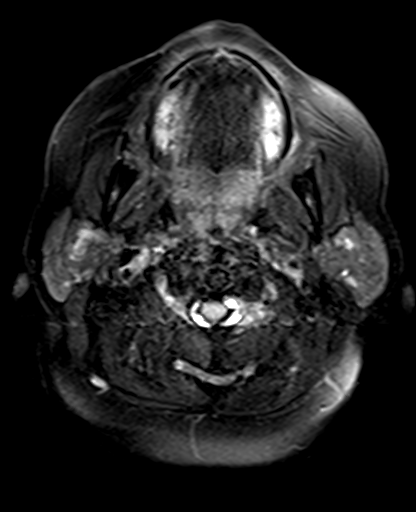
[im 25/25]
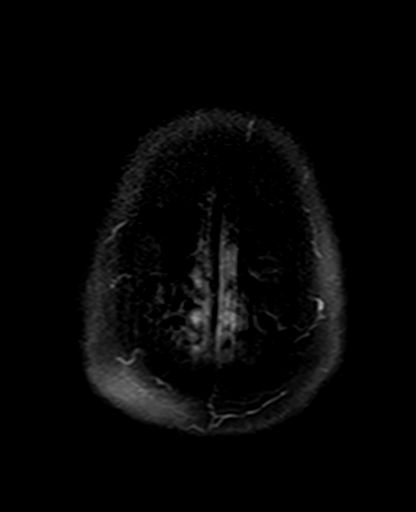

[Series 16: swi_images · axial · 3.0mm · 0.90mm/px · z∈[-78,+98]mm · 4 of 60 slices shown]
[im 1/60]
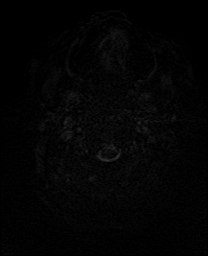
[im 20/60]
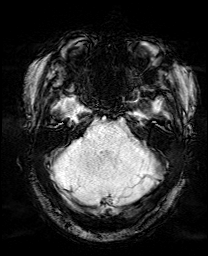
[im 40/60]
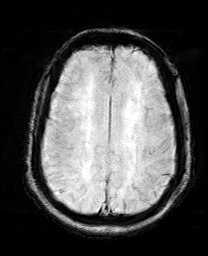
[im 60/60]
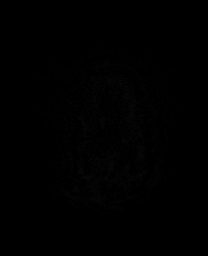

[Series 17: mip_images(sw) · axial · 24.0mm · 0.90mm/px · z∈[-68,+87]mm · 4 of 53 slices shown]
[im 1/53]
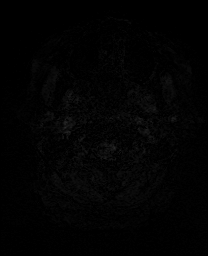
[im 18/53]
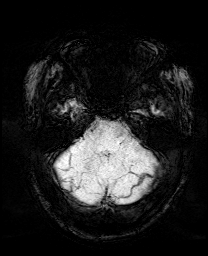
[im 35/53]
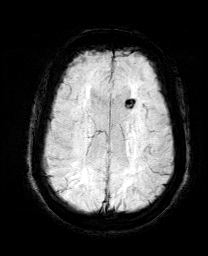
[im 53/53]
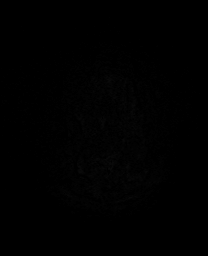

[Series 19: T2 · coronal · 5.0mm · 0.34mm/px · 2 of 29 slices shown (2 of 2)]
[im 1/29]
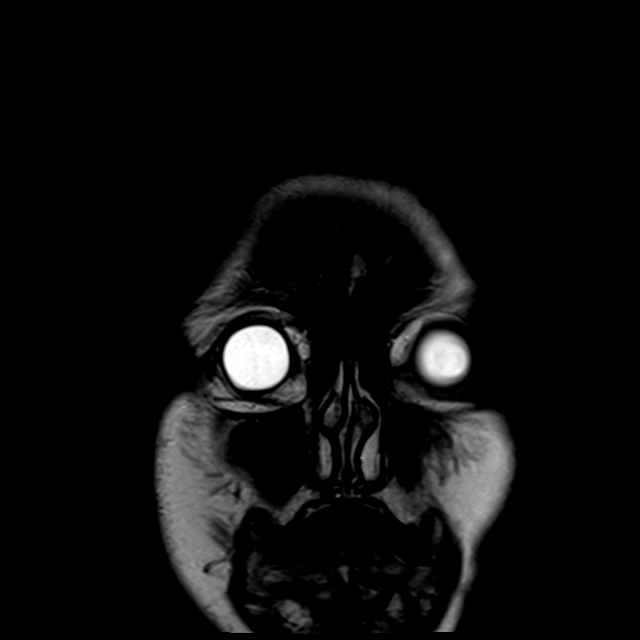
[im 29/29]
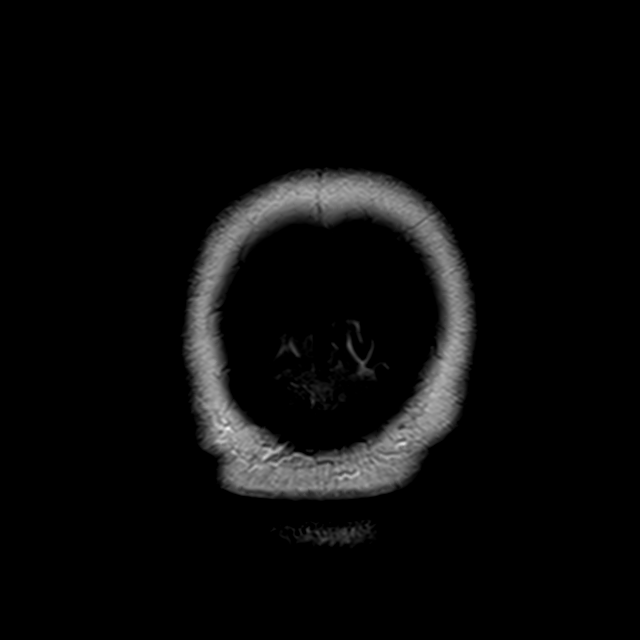

[32 of 48 positions shown; findings below may reference images not displayed]

FINDINGS: MRI HEAD FINDINGS

Brain: Resolving moderately large infarct in the posterior limb
internal capsule on the left. Moderate to extensive chronic
microvascular ischemic changes in the white matter. Mild chronic
ischemic change in the thalamus bilaterally.

Ventricle size normal.  Mild atrophy.  No mass lesion identified.

Negative for acute infarct. Chronic microhemorrhage left internal
capsule unchanged from the prior study.

Vascular: Normal arterial flow void

Skull and upper cervical spine: Negative.  ACDF cervical spine

Sinuses/Orbits: Paranasal sinuses clear. Bilateral cataract surgery.

Other: None

MRA HEAD FINDINGS

Both vertebral arteries patent to the basilar. Left PICA patent.
Right PICA not visualized however there is a prominent right AICA
which may supply this territory. Basilar widely patent. Posterior
cerebral arteries patent bilaterally. Superior cerebellar arteries
patent bilaterally.

3 mm aneurysm proximal right posterior cerebral artery projecting
inferiorly between the superior cerebellar artery and posterior
cerebral artery.

Internal carotid artery widely patent bilaterally. Anterior and
middle cerebral arteries patent bilaterally without significant
stenosis or large vessel occlusion.
IMPRESSION: 1. Negative for acute infarct. Recent left posterior limb internal
capsule infarct shows continued evolution. Moderate to extensive
chronic microvascular ischemia.
2. No significant intracranial stenosis or large vessel occlusion
3. Incidental 3 mm aneurysm right posterior cerebral artery origin.

## 2019-02-20 IMAGING — CT CT HEAD W/O CM
4 series · 17 of 47 positions shown, 19 images · non-contrast
Comparison: CT 10/29/2017, MR 10/29/2017

CLINICAL DATA: 72-year-old female with a history chest pain

EXAM:
CT HEAD WITHOUT CONTRAST
TECHNIQUE: Contiguous axial images were obtained from the base of the skull
through the vertex without intravenous contrast.

[Series 3: head without · axial · non-contrast · 0.44mm/px · z∈[-134,-9]mm · 7 of 35 slices shown, 9 images]
[im 5/35  brain]
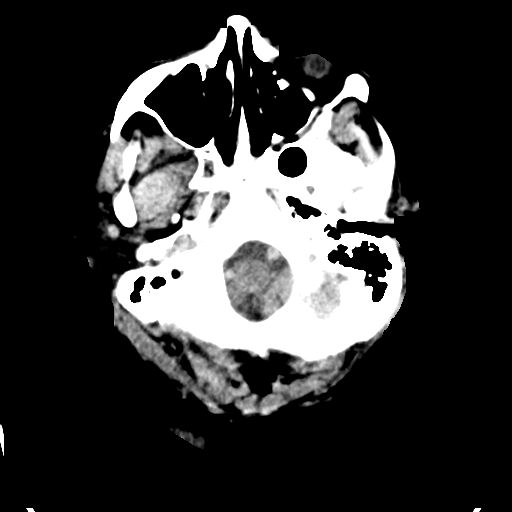
[im 5/35  bone]
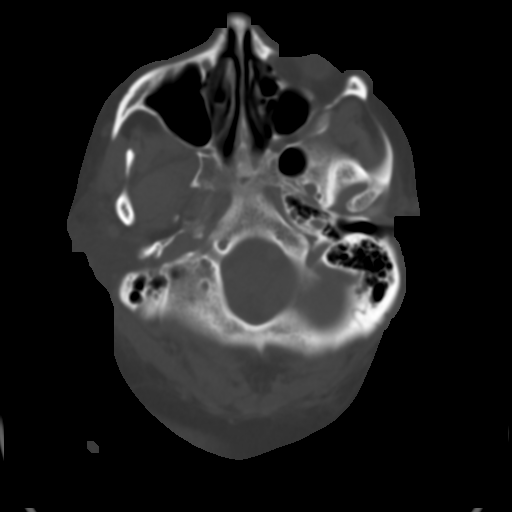
[im 9/35  brain]
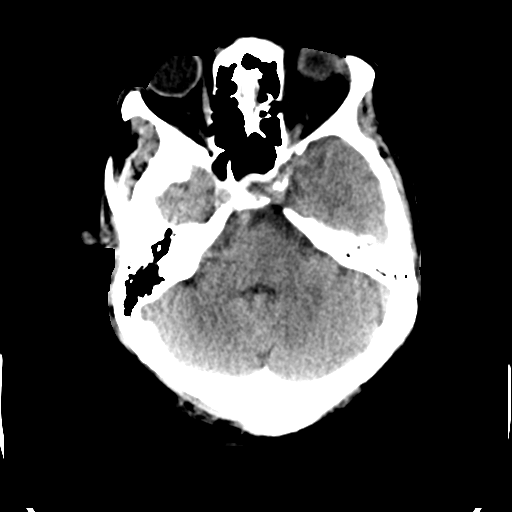
[im 13/35  brain]
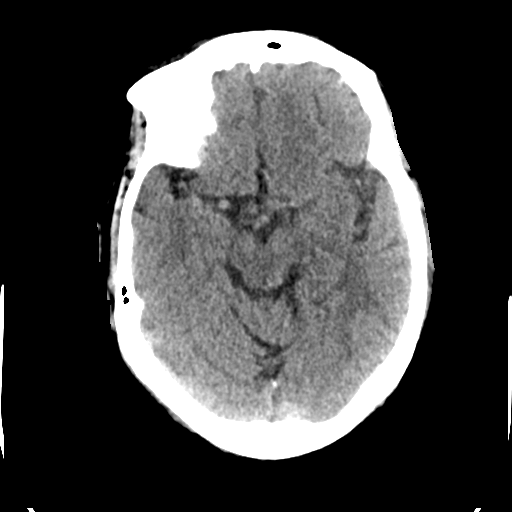
[im 18/35  brain]
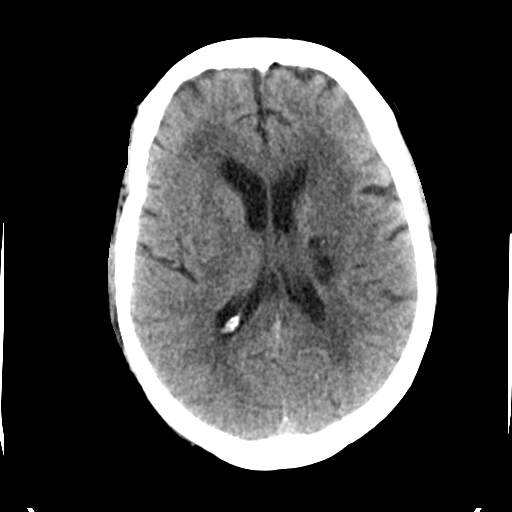
[im 22/35  brain]
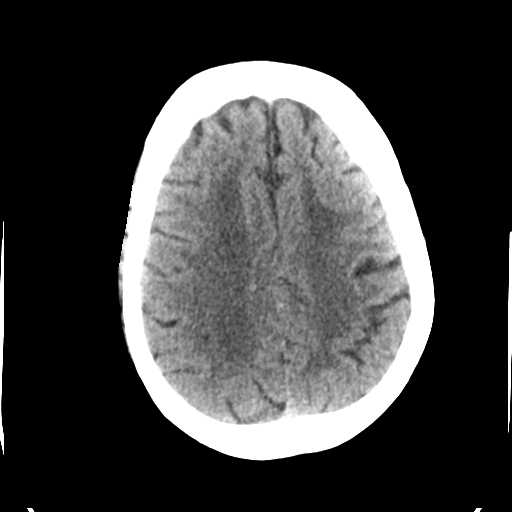
[im 22/35  bone]
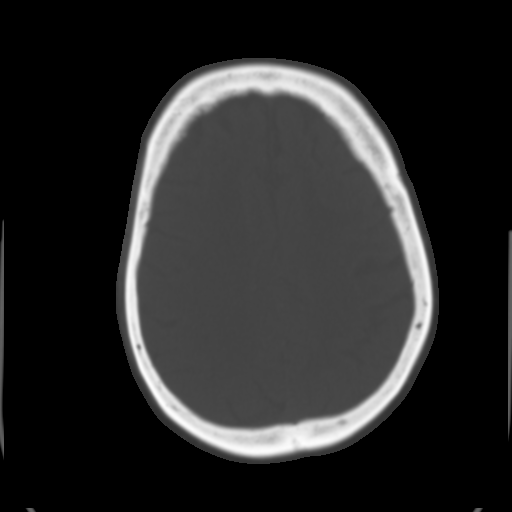
[im 26/35  brain]
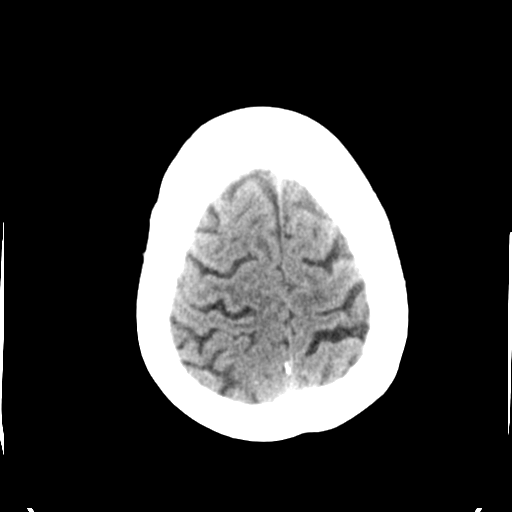
[im 30/35  brain]
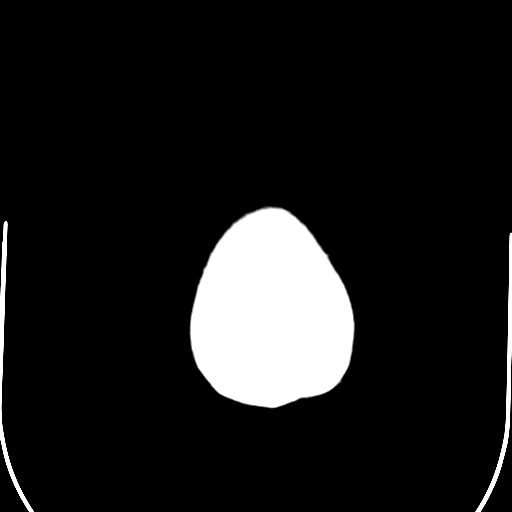

[Series 4: head bone · axial · 0.44mm/px · z∈[-138,-78]mm · 4 of 87 slices shown]
[im 9/87  bone]
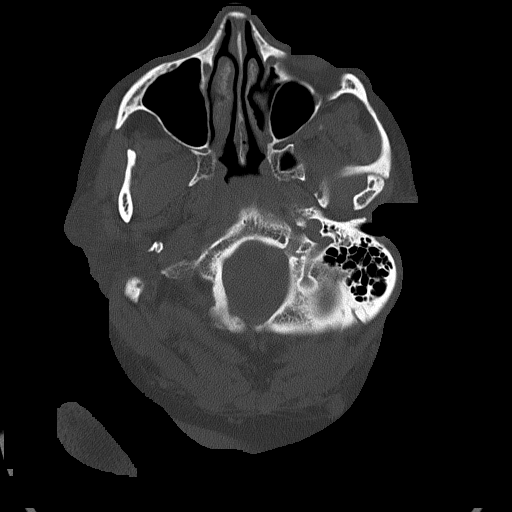
[im 18/87  bone]
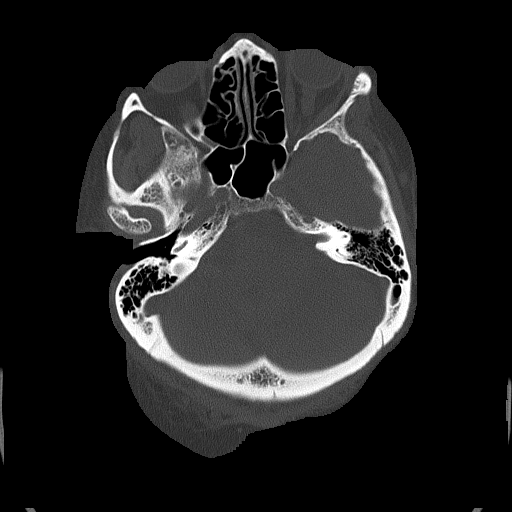
[im 26/87  bone]
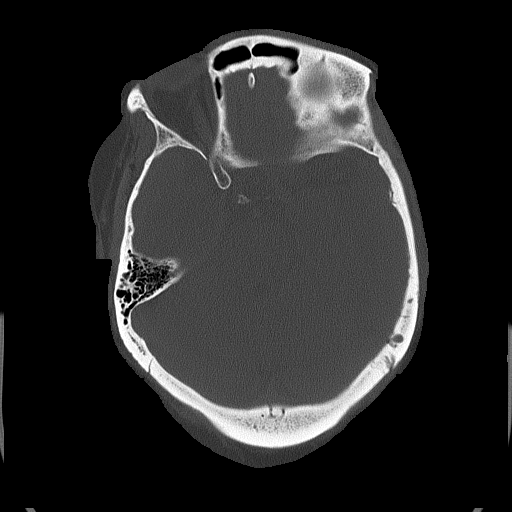
[im 39/87  bone]
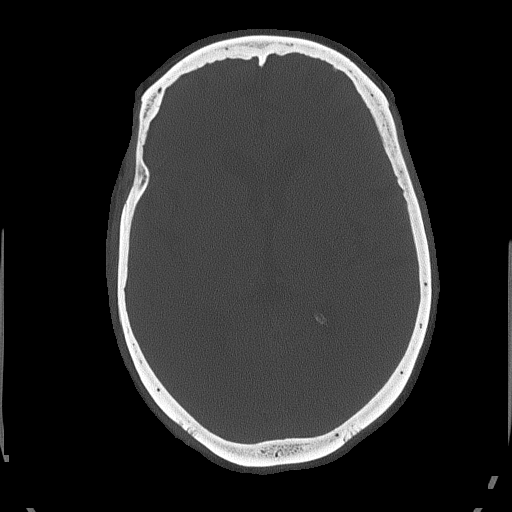

[Series 5: head without cor · coronal · non-contrast · 0.34mm/px · 3 of 67 slices shown]
[im 23/67  brain]
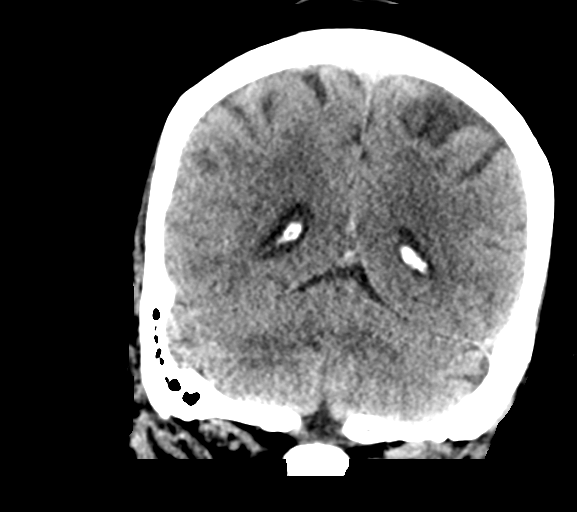
[im 30/67  brain]
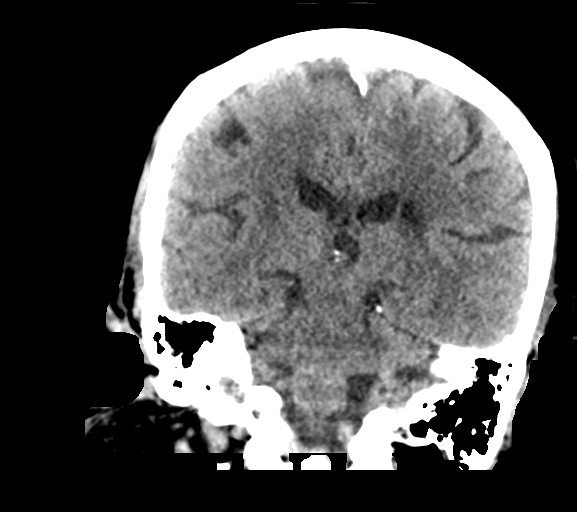
[im 37/67  brain]
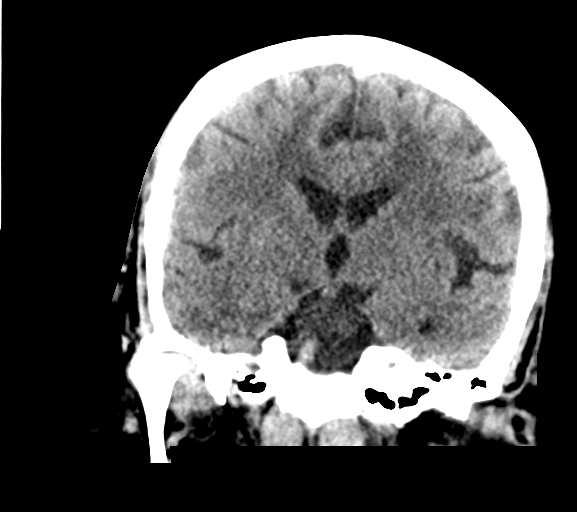

[Series 6: head without sag · sagittal · non-contrast · 0.34mm/px · 3 of 67 slices shown]
[im 26/67  brain]
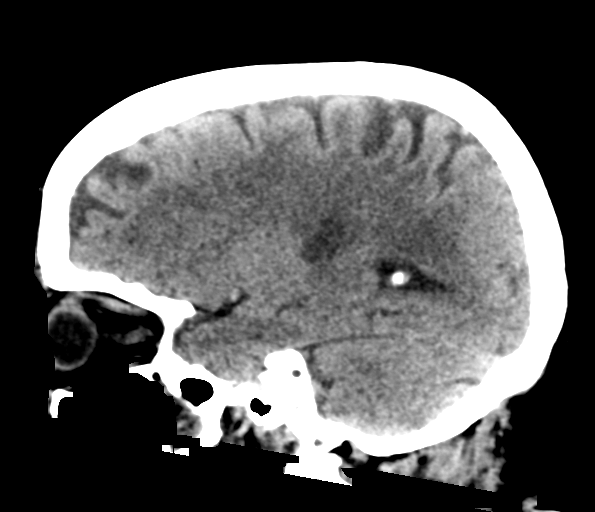
[im 34/67  brain]
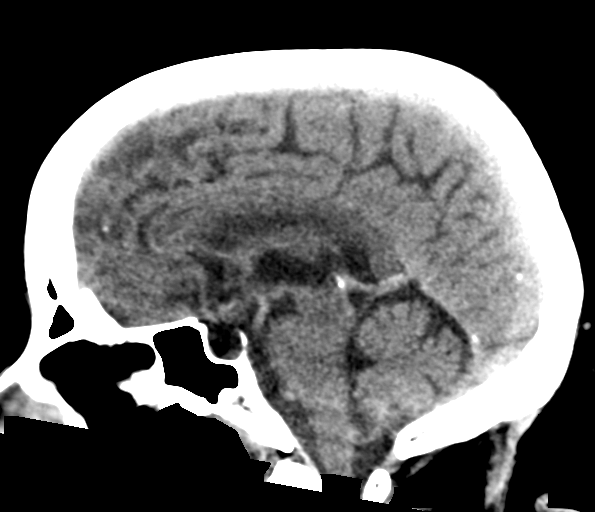
[im 41/67  brain]
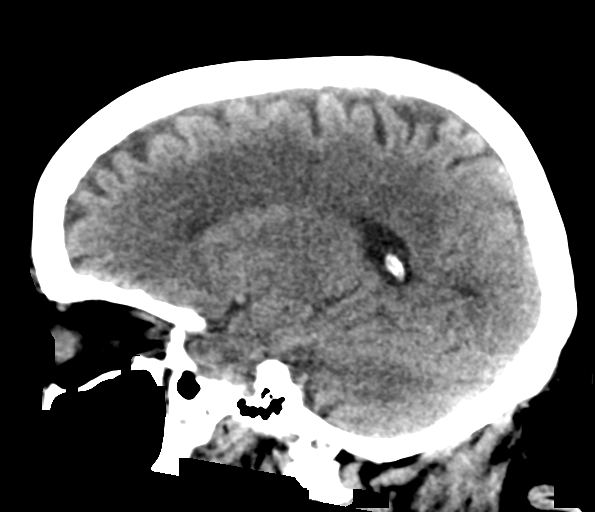

[17 of 47 positions shown; findings below may reference images not displayed]

FINDINGS: Brain: No acute intracranial hemorrhage. No midline shift or mass
effect. Gray-white differentiation maintained. Involving hypodensity
in the posterior left basal ganglia.

Unremarkable configuration of the ventricles.

Vascular: Calcifications of intracranial vasculature.

Skull: No acute displaced fracture.  No aggressive bony lesions.

Sinuses/Orbits: No acute finding.

Other: None.
IMPRESSION: No acute intracranial abnormality.

Evolution of known left basal ganglia infarction.

## 2019-02-21 ENCOUNTER — Ambulatory Visit: Payer: Medicare Other | Attending: *Deleted

## 2019-02-21 ENCOUNTER — Other Ambulatory Visit: Payer: Self-pay

## 2019-02-21 DIAGNOSIS — R2689 Other abnormalities of gait and mobility: Secondary | ICD-10-CM | POA: Diagnosis present

## 2019-02-21 DIAGNOSIS — R262 Difficulty in walking, not elsewhere classified: Secondary | ICD-10-CM | POA: Diagnosis present

## 2019-02-21 DIAGNOSIS — I69351 Hemiplegia and hemiparesis following cerebral infarction affecting right dominant side: Secondary | ICD-10-CM | POA: Insufficient documentation

## 2019-02-21 DIAGNOSIS — M21371 Foot drop, right foot: Secondary | ICD-10-CM | POA: Insufficient documentation

## 2019-02-21 DIAGNOSIS — R293 Abnormal posture: Secondary | ICD-10-CM | POA: Insufficient documentation

## 2019-02-21 DIAGNOSIS — R29818 Other symptoms and signs involving the nervous system: Secondary | ICD-10-CM | POA: Diagnosis present

## 2019-02-21 DIAGNOSIS — M6281 Muscle weakness (generalized): Secondary | ICD-10-CM

## 2019-02-21 NOTE — Therapy (Signed)
[facility] [address] [place], [place], [address] Phone: [phone]   Fax:  [phone]  Physical Therapy Treatment  Patient Details  Name: Holly Flores MRN: [id number] Date of Birth: 10-Feb-1946 Referring Provider (PT): Drue Second, FNP   Encounter Date: 02/21/2019  PT End of Session - 02/21/19 1322    Visit Number  39    Number of Visits  51    Date for PT Re-Evaluation  02/18/19    Authorization Type  Progress note every 10th visit; KX modifier at 15th visit.    PT Start Time  1320    PT Stop Time  1358    PT Time Calculation (min)  38 min    Equipment Utilized During Treatment  Gait belt    Activity Tolerance  Patient tolerated treatment well    Behavior During Therapy  WFL for tasks assessed/performed       Past Medical History:  Diagnosis Date  . Anxiety   . Arthritis    "all over" (12/20/2017)  . Central retinal vein occlusion of left eye 01/22/2015  . CHF (congestive heart failure) ([place])   . Chronic bronchitis ([place])    "get it most years" (12/20/2017)  . Chronic lower back pain   . CKD (chronic kidney disease), stage II   . COPD (chronic obstructive pulmonary disease) ([place])   . CVA (cerebral vascular accident) ([place]) 12/30/2014   OCULAR  LEFT  EYE   . Depression   . Fibromyalgia   . GERD (gastroesophageal reflux disease)   . Goiter   . History of colonic polyps 05/14/2014  . Hyperlipidemia   . Hypertension   . Hypothyroidism   . Kidney cysts    left side  . 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 Migraine    "couple times/wk; been having them for a long time" (12/20/2017)  . Myocardial infarction [facility])    "one dr said I did; one said I didn't;  don't remember when it was" (12/20/2017)  . 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 On home oxygen therapy    "3L; sleep w/it q night" (12/20/2017)  . Pneumonia    "several times" (12/20/2017)  .  Shortness of breath   . Sleep apnea    "stopped mask when I got on the oxygen" (12/20/2017)  . Stroke [facility])     Past Surgical History:  Procedure Laterality Date  . ANTERIOR CERVICAL DECOMP/DISCECTOMY FUSION    . BACK SURGERY    . BREAST BIOPSY     left-non cancerous  . CARDIAC CATHETERIZATION    . CARPAL TUNNEL RELEASE Left   . CATARACT EXTRACTION W/PHACO  09/21/2011   Procedure: CATARACT EXTRACTION PHACO AND INTRAOCULAR LENS PLACEMENT (IOC);  Surgeon: Williams Che, MD;  Location: [facility];  Service: Ophthalmology;  Laterality: Left;  CDE:9.78  . CATARACT EXTRACTION W/PHACO Right 10/15/2014   Procedure: CATARACT EXTRACTION PHACO AND INTRAOCULAR LENS PLACEMENT ([place]);  Surgeon: Williams Che, MD;  Location: [facility];  Service: Ophthalmology;  Laterality: Right;  CDE:4.19  . [place]; 1972  . CHOLECYSTECTOMY OPEN    . COLONOSCOPY  2006   RMR: 1. Internal hemorroids, otherwise normal rectum. 2. Pedunculated polyp at 35 cm. reomved with snare. The remainder of teh colonic mucosa appeared normal.   . COLONOSCOPY  2011   Dr. Gala Romney: multiple ascending colon polyps and rectal polyp, adenomatous  . COLONOSCOPY N/A 05/31/2014  Procedure: COLONOSCOPY;  Surgeon: Daneil Dolin, MD;  Location: [facility];  Service: Endoscopy;  Laterality: N/A;  130  . COLONOSCOPY WITH PROPOFOL N/A 06/24/2017   Procedure: COLONOSCOPY WITH PROPOFOL;  Surgeon: Daneil Dolin, MD;  [facility];  Service: Endoscopy;  Laterality: N/A;  2:15pm  . ESOPHAGOGASTRODUODENOSCOPY  2006   Dr. Gala Romney: Subtle Schatzkis ring, otherwise normal upper GI tract, aside from a small pyloric channell erosion, status post dilation as described above.   Marland Kitchen FLEXIBLE SIGMOIDOSCOPY N/A 11/11/2017   Procedure: FLEXIBLE SIGMOIDOSCOPY;  Surgeon: Otis Brace, MD;  Location: [facility];  Service: Gastroenterology;  Laterality: N/A;  . JOINT REPLACEMENT    . LUMBAR DISC SURGERY  X 2  . POLYPECTOMY  06/24/2017    Procedure: POLYPECTOMY;  Surgeon: Daneil Dolin, MD;  Location: [facility];  Service: Endoscopy;;  ascending  . POSTERIOR LUMBAR FUSION  ? date 1st fusion ; 07/2006   previous L4-5 Ray threaded fusion cage; Exploration of L4-5 fusion & PLIF 07/22/2006/notes 07/22/2006  . TONSILLECTOMY    . TOTAL KNEE ARTHROPLASTY Right 05/24/2013   Procedure: TOTAL KNEE ARTHROPLASTY;  Surgeon: [name], MD;  Location: [facility];  Service: Orthopedics;  Laterality: Right;  . TOTAL KNEE ARTHROPLASTY Left   . VAGINAL HYSTERECTOMY      There were no vitals filed for this visit.  Subjective Assessment - 02/21/19 1323    Subjective  Pt ddid not bring AFO again. Daughter states that brace is in other car. Pt states that she still does not have shoe that will fit the brace, however, but son has ordered.  Husband was able to fix locks on wheelchair.    Patient is accompained by:  Family member   son   Pertinent History  left sided CVA 10/29/17, Chronic CHF, AFib, COPD, Chronic Kidney Disease stage III, hx of bil TKA, Lumbar surgery    Limitations  Lifting;Standing;Walking;House hold activities    Diagnostic tests  MRI: left external capsule CVA    Patient Stated Goals  move around better in the home and improve function    Currently in Pain?  No/denies                       [facility] Adult PT Treatment/Exercise - 02/21/19 1340      Bed Mobility   Bed Mobility  Rolling Left;Sit to Supine;Supine to Sit    Rolling Left  Moderate Assistance - Patient 50-74%    Rolling Left Details (indicate cue type and reason)  Pt needed reminder to bring right arm across body prior to trying to roll. PT positioned right leg across left to help.    Left Sidelying to Sit  Minimal Assistance - Patient >75%    Sit to Supine  Minimal Assistance - Patient > 75%      Transfers   Transfers  Sit to Stand;Stand to Sit;Stand Pivot Transfers    Sit to Stand  3: Mod assist    Sit to Stand Details  Verbal cues for technique     Sit to Stand Details (indicate cue type and reason)  Pt was instructed to lean forward. PT blocked right knee and ankle    Stand to Sit  4: Min assist;3: Mod assist    Stand to Sit Details  Verbal cues to lean forward when going to sit to help sit back further in chair.    Stand Pivot Transfers  2: Max assist  Stand Pivot Transfer Details (indicate cue type and reason)  w/c to/from mat     Comments  Pt stood at mat holding hemiwalker >1 min first time then about 20 sec second time.  Peformed a third time standing about 40 sec. PT was blocking right knee throughout and verbal and tactile cues to stand more erect and staighten right knee and shift weight more to left. Pt able to shift to left better on first bout but quickly fatigued needing mod/max assist to maintain standing the latter bouts.      Exercises   Exercises  Other Exercises    Other Exercises   Supine bridges, isometric right hip adduction in hooklying. Sidelying left for right clamshell x 10 with only partial range with verbal and tactile cues for form.             PT Education - 02/21/19 1704    Education Details  PT instructed patient and daughter that primary PT would make decision on recert next visit. Advised that it would be curcial to therapy to bring AFO and obtain shoe that fits it.    Person(s) Educated  Patient;Child(ren)    Methods  Explanation    Comprehension  Verbalized understanding       PT Short Term Goals - 02/14/19 1945      PT SHORT TERM GOAL #1   Title  Pt and family will be independent with supine, seated and standing HEP. ALL STGS DUE 01/26/19    Baseline  02/02/19: dependent to date    Time  4    Period  Weeks    Status  On-going    Target Date  01/26/19      PT SHORT TERM GOAL #2   Title  Patient will ambulate 31' with mod A of one therapist. with RW and R AFO.    Baseline  02/02/19: pt does not have larger shoe to fit her AFO yet    Time  4    Period  Weeks    Status  Unable to  assess    Target Date  01/26/19      PT SHORT TERM GOAL #3   Title  Patient will perform sit <> stand from mat table with R AFO and RW with mod A of one therapist.    Baseline  02/02/19: pt does not have larger shoe to fit her AFO yet    Time  4    Period  Weeks    Status  Unable to assess    Target Date  01/26/19        PT Long Term Goals - 02/21/19 1705      PT LONG TERM GOAL #1   Title  Patient and family will be independent with final HEP recommendations. ALL LTGS DUE 02/23/19    Baseline  pt reports that she is trying to perform HEP at home when she can.    Time  8    Period  Weeks    Status  On-going      PT LONG TERM GOAL #2   Title  Patient will perform sit <> stand from wheelchair with min A and transfer stand pivot with RW and AFO with Mod A of one person    Baseline  slow to progress due to pt not having the proper shoe size to wear R AFO. 02/21/2019 mod/max assist to rise with PT blocking right knee    Time  8    Period  Weeks    Status  On-going      PT LONG TERM GOAL #3   Title  Pt will perform bed mobility on flat mat with min A    Baseline  02/21/2019 CGA/min assist sit to supine and mod assist supine to sit as needs mod assist to roll and then min assist to rise from sidelying    Time  8    Period  Weeks    Status  On-going      PT LONG TERM GOAL #4   Title  Family will demonstrate independent ability to don appropriate AFO with R shoe    Baseline  pt needing max A from therapist to don R AFO due to improper shoe size/width    Time  8    Period  Weeks    Status  On-going      PT LONG TERM GOAL #5   Title  Patient will ambulate 40 feet or greater with min A, AFO, and LRAD to safely navigate around home.    Baseline  slow to progress due to pt not having the proper shoe size to wear R AFO    Time  8    Period  Weeks    Status  On-going            Plan - 02/21/19 1707    Clinical Impression Statement  Pt needed reminder for proper technique with  rolling with bed mobility forgetting to bring right arm across chest. Pt was able to stand more upright with initial standing bout but fatigued quickly with pushing to right with latter bouts. Pt further limited with having no assist at home for more carryover as husband can not assist to transfer. Daughter stated they are trying to hire an aide.    Personal Factors and Comorbidities  Age;Comorbidity 3+;Time since onset of injury/illness/exacerbation    Examination-Activity Limitations  Lift;Dressing;Self Feeding;Transfers;Toileting;Stand;Locomotion Level;Reach Overhead;Bed Mobility    Rehab Potential  Fair    PT Frequency  2x / week    PT Duration  8 weeks    PT Treatment/Interventions  ADLs/Self Care Home Management;Electrical Stimulation;Gait training;Stair training;Functional mobility training;Balance training;Therapeutic exercise;Therapeutic activities;Neuromuscular re-education;Wheelchair mobility training;Manual techniques;Passive range of motion;Patient/family education;DME Instruction;Energy conservation;Orthotic Fit/Training    PT Next Visit Plan  Recert due next visit if appropriate and 10th visit progress note. W/c eval next week.    Consulted and Agree with Plan of Care  Patient    Family Member Consulted  daughter       Patient will benefit from skilled therapeutic intervention in order to improve the following deficits and impairments:  Pain, Postural dysfunction, Impaired tone, Decreased mobility, Decreased activity tolerance, Decreased range of motion, Decreased strength, Impaired UE functional use, Difficulty walking, Decreased balance, Abnormal gait, Decreased endurance  Visit Diagnosis: Muscle weakness (generalized)     Problem List Patient Active Problem List   Diagnosis Date Noted  . Acute CVA (cerebrovascular accident) ([place]) 10/01/2018  . Physical deconditioning 12/19/2017  . Chronic kidney disease (CKD), stage III (moderate)   . Hypoalbuminemia due to  protein-calorie malnutrition ([place])   . Intractable migraine without status migrainosus   . Reactive depression   . Hemiparesis affecting right side as late effect of stroke ([place])   . Slow transit constipation   . PAF (paroxysmal atrial fibrillation) ([place])   . Dysphasia, post-stroke   . COPD (chronic obstructive pulmonary disease) ([place]) 10/29/2017  . Chronic diastolic CHF (congestive heart failure) ([place]) 10/29/2017  . Hypothyroidism 10/29/2017  .  Essential hypertension 10/29/2017  . GERD (gastroesophageal reflux disease) 10/29/2017  . Anxiety 10/29/2017  . 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 Hyperlipidemia 04/20/2013    Electa Sniff, PT, DPT, NCS 02/21/2019, 5:11 PM  [facility] [address] [place], [place], [address] Phone: [phone]   Fax:  [phone]  Name: Holly Flores MRN: [id number] Date of Birth: 26-Sep-1945

## 2019-02-24 ENCOUNTER — Ambulatory Visit: Payer: Medicare Other | Admitting: Physical Therapy

## 2019-02-27 ENCOUNTER — Ambulatory Visit: Payer: Medicare Other | Admitting: Physical Therapy

## 2019-03-02 ENCOUNTER — Other Ambulatory Visit: Payer: Self-pay

## 2019-03-02 ENCOUNTER — Ambulatory Visit: Payer: Medicare Other | Admitting: Physical Therapy

## 2019-03-02 DIAGNOSIS — M6281 Muscle weakness (generalized): Secondary | ICD-10-CM | POA: Diagnosis not present

## 2019-03-02 DIAGNOSIS — R29818 Other symptoms and signs involving the nervous system: Secondary | ICD-10-CM

## 2019-03-02 DIAGNOSIS — I69351 Hemiplegia and hemiparesis following cerebral infarction affecting right dominant side: Secondary | ICD-10-CM

## 2019-03-02 DIAGNOSIS — R293 Abnormal posture: Secondary | ICD-10-CM

## 2019-03-02 DIAGNOSIS — R2689 Other abnormalities of gait and mobility: Secondary | ICD-10-CM

## 2019-03-02 DIAGNOSIS — R262 Difficulty in walking, not elsewhere classified: Secondary | ICD-10-CM

## 2019-03-02 DIAGNOSIS — M21371 Foot drop, right foot: Secondary | ICD-10-CM

## 2019-03-02 NOTE — Therapy (Addendum)
[facility] [address] [place], [place], [address] Phone: [phone]   Fax:  [phone]  Physical Therapy Treatment/10th visit Progress Note and Discharge Summary  Patient Details  Name: Holly Flores MRN: [id number] Date of Birth: 08-Dec-1945 Referring Provider (PT): Drue Second, FNP   Encounter Date: 03/02/2019   Progress Note Reporting Period 12/06/2018 to 03/02/2019  See note below for Objective Data and Assessment of Progress/Goals.    PT End of Session - 03/02/19 1700    Visit Number  40    Number of Visits  69    Date for PT Re-Evaluation  03/02/19    Authorization Type  Progress note every 10th visit; KX modifier at 15th visit.    PT Start Time  1315    PT Stop Time  1420    PT Time Calculation (min)  65 min    Activity Tolerance  Patient tolerated treatment well    Behavior During Therapy  WFL for tasks assessed/performed       Past Medical History:  Diagnosis Date  . Anxiety   . Arthritis    "all over" (12/20/2017)  . Central retinal vein occlusion of left eye 01/22/2015  . CHF (congestive heart failure) ([place])   . Chronic bronchitis ([place])    "get it most years" (12/20/2017)  . Chronic lower back pain   . CKD (chronic kidney disease), stage II   . COPD (chronic obstructive pulmonary disease) ([place])   . CVA (cerebral vascular accident) ([place]) 12/30/2014   OCULAR  LEFT  EYE   . Depression   . Fibromyalgia   . GERD (gastroesophageal reflux disease)   . Goiter   . History of colonic polyps 05/14/2014  . Hyperlipidemia   . Hypertension   . Hypothyroidism   . Kidney cysts    left side  . 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 Migraine    "couple times/wk; been having them for a long time" (12/20/2017)  . Myocardial infarction [facility])    "one dr said I did; one said I didn't;  don't remember when it was" (12/20/2017)  . 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 On home oxygen therapy    "3L; sleep w/it q night" (12/20/2017)  . Pneumonia    "several times" (12/20/2017)  . Shortness of breath   . Sleep apnea    "stopped mask when I got on the oxygen" (12/20/2017)  . Stroke [facility])     Past Surgical History:  Procedure Laterality Date  . ANTERIOR CERVICAL DECOMP/DISCECTOMY FUSION    . BACK SURGERY    . BREAST BIOPSY     left-non cancerous  . CARDIAC CATHETERIZATION    . CARPAL TUNNEL RELEASE Left   . CATARACT EXTRACTION W/PHACO  09/21/2011   Procedure: CATARACT EXTRACTION PHACO AND INTRAOCULAR LENS PLACEMENT (IOC);  Surgeon: Williams Che, MD;  Location: [facility] OR[facility];  Service: Ophthalmology;  Laterality: Left;  CDE:9.78  . CATARACT EXTRACTION W/PHACO Right 10/15/2014   Procedure: CATARACT EXTRACTION PHACO AND INTRAOCULAR LENS PLACEMENT ([place]);  Surgeon: Williams Che, MD;  Location: [facility];  Service: Ophthalmology;  Laterality: Right;  CDE:4.19  . [place]; 1972  . CHOLECYSTECTOMY OPEN    . COLONOSCOPY  2006   RMR: 1. Internal hemorroids, otherwise normal rectum. 2. Pedunculated polyp at 35 cm. reomved with snare. The remainder of teh colonic mucosa appeared normal.   . COLONOSCOPY  2011  Dr. Gala Romney: multiple ascending colon polyps and rectal polyp, adenomatous  . COLONOSCOPY N/A 05/31/2014   Procedure: COLONOSCOPY;  Surgeon: Daneil Dolin, MD;  Location: [facility];  Service: Endoscopy;  Laterality: N/A;  130  . COLONOSCOPY WITH PROPOFOL N/A 06/24/2017   Procedure: COLONOSCOPY WITH PROPOFOL;  Surgeon: Daneil Dolin, MD;  Location: [facility];  Service: Endoscopy;  Laterality: N/A;  2:15pm  . ESOPHAGOGASTRODUODENOSCOPY  2006   Dr. Gala Romney: Subtle Schatzkis ring, otherwise normal upper GI tract, aside from a small pyloric channell erosion, status post dilation as described above.   Marland Kitchen FLEXIBLE SIGMOIDOSCOPY N/A 11/11/2017   Procedure: FLEXIBLE SIGMOIDOSCOPY;  Surgeon: Otis Brace, MD;  Location: [facility];   Service: Gastroenterology;  Laterality: N/A;  . JOINT REPLACEMENT    . LUMBAR DISC SURGERY  X 2  . POLYPECTOMY  06/24/2017   Procedure: POLYPECTOMY;  Surgeon: Daneil Dolin, MD;  Location: [facility];  Service: Endoscopy;;  ascending  . POSTERIOR LUMBAR FUSION  ? date 1st fusion ; 07/2006   previous L4-5 Ray threaded fusion cage; Exploration of L4-5 fusion & PLIF 07/22/2006/notes 07/22/2006  . TONSILLECTOMY    . TOTAL KNEE ARTHROPLASTY Right 05/24/2013   Procedure: TOTAL KNEE ARTHROPLASTY;  Surgeon: [name], MD;  Location: [facility];  Service: Orthopedics;  Laterality: Right;  . TOTAL KNEE ARTHROPLASTY Left   . VAGINAL HYSTERECTOMY      There were no vitals filed for this visit.  Subjective Assessment - 03/02/19 1656    Subjective  Shoe still has not been delivered.  Was not able to come earlier this week due to not having transportation.  Pt reports physician has ordered [facility] therapy and aide for her but wasn't sure if today was last visit.  Is ready to proceed with getting power wheelchair.    Patient is accompained by:  Family member   son   Pertinent History  left sided CVA 10/29/17, Chronic CHF, AFib, COPD, Chronic Kidney Disease stage III, hx of bil TKA, Lumbar surgery    Limitations  Lifting;Standing;Walking;House hold activities    Diagnostic tests  MRI: left external capsule CVA    Patient Stated Goals  move around better in the home and improve function    Currently in Pain?  No/denies         [facility] PT Assessment - 03/02/19 1705      Assessment   Medical Diagnosis  Left sided CVA    Referring Provider (PT)  Drue Second, FNP    Onset Date/Surgical Date  10/29/17    Hand Dominance  Right    Prior Therapy  yes, inpatient      Precautions   Precautions  Fall      Prior Function   Level of Independence  Independent      Observation/Other Assessments   Focus on Therapeutic Outcomes [facility])   N/A                   [facility] Adult PT Treatment/Exercise  - 03/02/19 1658      Therapeutic Activites    Therapeutic Activities  Other Therapeutic Activities    Other Therapeutic Activities  Treatment session focused on completing power wheelchair evaluation with [employer].  Before [employer] arrived therapist discussed plan with pt.  Due to patient's significant functional limitations at home and increased time for travel, this PT feels home health would be appropriate for pt at this time.  Will D/C patient today and plan to  have pt return to outpatient when she completes home health, when shoe has been delivered and likely once she receives her power w/c for transfer training.  Pt and son agree to plan.        Wheelchair evaluation was completed at today's evaluation.  Paperwork completed and provided to wheelchair vendor from [employer].  Face to face evaluation with MD to follow for further sign off for power mobility.  Copy of PT letter of medical necessity available in EPIC.    Mobility/Seating Evaluation    PATIENT INFORMATION: Name: Holly Flores. Beilke DOB: [birth year]  Sex: Female Date seen: 03/02/2019 Time: 13:15  Address:  [address] [place], [place] [address] Physician: Wannetta Sender, FNP This evaluation/justification form will serve as the LMN for the following suppliers: __________________________ Supplier: [employer] Contact Person: Luz Brazen, [facility] Phone:  [phone]   Seating Therapist: Misty Stanley, PT, DPT Phone:   [phone]   Phone: [phone]    Spouse/Parent/Caregiver name: Ruthann Angulo   Phone number: [phone] Insurance/Payer: [employer]     Reason for Referral: Power mobility evaluation  Patient/Caregiver Goals: To be able to move around her home independently; otherwise pt is bed bound; unable to propel manual w/c  Patient was seen for face-to-face evaluation for new power wheelchair.  Also present was Luz Brazen, [employer] and patient's son Lennette Bihari  to discuss recommendations and wheelchair  options.  Further paperwork was completed and sent to vendor.  Patient appears to qualify for power mobility device at this time per objective findings.   MEDICAL HISTORY: Diagnosis: Primary Diagnosis: Left MCA CVA with Right Hemiplegia Onset: 10/22/2017 Diagnosis: Chronic Diastolic CHF   '[]' Progressive Disease Relevant past and future surgeries: anterior cervical decompression/discectomy fusion, back surgery - posterior lumbar fusion, R and L total knee arthroplasty, cardiac catheterization, L carpal tunnel release, cataract extraction,    Height: '5\' 2"'  Weight: 180 lb Explain recent changes or trends in weight: Weight stable   History including Falls: DJD, myocardial infarction, COPD, sleep apnea- on home oxygen - 3L at night, PNA, hypothyroidism, HTN, anxiety, paroxysmal atrial fibrillation, dysphasia, reactive depression, intractable migraine, chronic kidney disease - stage III, fibromyalgia, chronic LBP    HOME ENVIRONMENT: '[x]' House  '[]' Condo/town home  '[]' Apartment  '[]' Assisted Living    '[]' Lives Alone '[x]'  Lives with Others                                                                                          Hours with caregiver: husband is with patient 24/7 but unable to assist physically with transfers; they are looking into hiring aide for transfers and ADL.  Children assist intermittently  '[x]' Home is accessible to patient           Stairs      '[]' Yes '[]'  No     Ramp '[x]' Yes '[]' No Comments:  Hardwood floors, doors are widened    COMMUNITY ADL: TRANSPORTATION: '[x]' Car    '[]' Van    '[]' Public Transportation    '[]' Adapted w/c Lift    '[]' Ambulance    '[]' Other:       '[]' Sits in wheelchair during  transport  Employment/School: ????? Specific requirements pertaining to mobility ?????  Other: Has lift on back of car to transport power wheelchair    FUNCTIONAL/SENSORY PROCESSING SKILLS:  Handedness:   '[x]' Right     '[]' Left    '[]' NA  Comments:  But will need joystick on L side due to R hemiplegia  Functional  Processing Skills for Wheeled Mobility '[x]' Processing Skills are adequate for safe wheelchair operation  Areas of concern than may interfere with safe operation of wheelchair Description of problem   '[]'  Attention to environment      '[]' Judgment      '[]'  Hearing  '[]'  Vision or visual processing      '[]' Motor Planning  '[]'  Fluctuations in Behavior  ?????    VERBAL COMMUNICATION: '[x]' WFL receptive '[x]'  WFL expressive '[]' Understandable  '[]' Difficult to understand  '[]' non-communicative '[]'  Uses an augmented communication device  CURRENT SEATING / MOBILITY: Current Mobility Base:  '[]' None '[]' Dependent '[x]' Manual '[]' Scooter '[]' Power  Type of Control: ?????  Manufacturer:  Insurance underwriter Size:  20 x 16Age: >2 years  Current Condition of Mobility Base:  Fair, brakes broken - has attempted to re-tighten multiple times   Current Wheelchair components:  sling back and sling seat, 2" basic foam cushion, flip back arm rests, has leg rests but pt does not use  Describe posture in present seating system:  sacral sitting, upper trunk shifted to R tilted to L, hips shifted to L, hips slid forward in seat, RUE supported but with shoulder elevated      SENSATION and SKIN ISSUES: Sensation '[x]' Intact  '[]' Impaired '[]' Absent  Level of sensation: ????? Pressure Relief: Able to perform effective pressure relief :    '[]' Yes  '[x]'  No Method: ???? If not, Why?: unable to weight shift or stand to relieve pressure on buttocks/hips due to hemiplegia  Skin Issues/Skin Integrity Current Skin Issues  '[]' Yes '[x]' No '[]' Intact '[]'  Red area'[]'  Open Area  '[]' Scar Tissue '[x]' At risk from prolonged sitting Where  ?????  History of Skin Issues  '[x]' Yes '[]' No Where  buttocks When  when in SNF; stage 1 treated with cream  Hx of skin flap surgeries  '[]' Yes '[x]' No Where  ????? When  ?????  Limited sitting tolerance '[x]' Yes '[]' No Hours spent sitting in wheelchair daily: 2 hours  Complaint of Pain:  Please describe: Chronic low back pain, L shoulder pain with  elevation   Swelling/Edema: No   ADL STATUS (in reference to wheelchair use):  Indep Assist Unable Indep with Equip Not assessed Comments  Dressing ????? X ????? ????? ????? Bed level  Eating ????? ????? ????? X ????? Currently eats propped up in bed - can't eat at table in current w/c  Toileting ????? X X ????? ????? If daughter present - uses bedside commode; when just husband present, pt uses briefs  Bathing ????? X ????? ????? ????? Sponge bath in the bed with husband; daughter/granddaughters assist with shower transfer and showers  Grooming/Hygiene ????? ????? ????? X ????? seated in chair  Meal Prep ????? ????? X ????? ????? ?????  IADLS ????? ????? X ????? ????? ?????  Bowel Management: '[x]' Continent  '[]' Incontinent  '[x]' Accidents Comments:  when family present she can have continent BM on BSC; when just husband present pt has to use brief  Bladder Management: '[x]' Continent  '[]' Incontinent  '[x]' Accidents Comments:  when family present she can have continent urine output on BSC; when just husband present pt has to use brief     WHEELCHAIR SKILLS: Manual w/c Propulsion: '[]' UE or LE strength and  endurance sufficient to participate in ADLs using manual wheelchair Arm : '[]' left '[]' right   '[]' Both      Distance: ????? Foot:  '[]' left '[]' right   '[]' Both  Operate Scooter: '[]'  Strength, hand grip, balance and transfer appropriate for use '[]' Living environment is accessible for use of scooter  Operate Power w/c:  '[x]'  Std. Joystick   '[]'  Alternative Controls Indep '[]'  Assist '[]'  Dependent/unable '[]'  N/A '[]'   '[x]' Safe          '[x]'  Functional      Distance: >1,000  Bed confined without wheelchair '[x]'  Yes '[]'  No   STRENGTH/RANGE OF MOTION:  Active and Passive Range of Motion Strength  Shoulder RUE: flaccid LUE: flexion to 80 deg with pain RUE: 0/5 LUE: 2/5  Elbow RUE: flaccid LUE: WFL RUE: 0/5 LUE: 4/5  Wrist/Hand RUE: flaccid LUE: WFL RUE: 0/5 LUE: 5/5  Hip RLE: WFL  LLE: WFL RLE: 1/5 hip  flexion LLE: 4/5 hip flexion  Knee RLE: WFL LLE: WFL RLE: 2/5 knee extension LLE:4+/5  Ankle RLE: WFL LLE: WFL  RLE: 0/5 LLE: 4+/5      MOBILITY/BALANCE:  '[]'  Patient is totally dependent for mobility  ?????    Balance Transfers Ambulation  Sitting Balance: Standing Balance: '[]'  Independent '[]'  Independent/Modified Independent  '[]'  WFL     '[]'  WFL '[]'  Supervision '[]'  Supervision  '[]'  Uses UE for balance  '[]'  Supervision '[]'  Min Assist '[]'  Ambulates with Assist  ?????    '[x]'  Min Assist '[]'  Min assist '[]'  Mod Assist '[]'  Ambulates with Device:      '[]'  RW  '[]'  StW  '[]'  Cane  '[]'  ?????  '[]'  Mod Assist '[]'  Mod assist '[x]'  Max assist   '[]'  Max Assist '[x]'  Max assist '[]'  Dependent '[]'  Indep. Short Distance Only  '[]'  Unable '[]'  Unable '[x]'  Lift / Sling Required Distance (in feet)  ?????   '[]'  Sliding board '[x]'  Unable to Ambulate (see explanation below)  Cardio Status:  '[]' Intact  '[x]'  Impaired   '[]'  NA     CHF, MI, Afib, HTN  Respiratory Status:  '[]' Intact   '[x]' Impaired   '[]' NA     COPD, OSA  Orthotics/Prosthetics: R AFO for transfers only  Comments (Address manual vs power w/c vs scooter): Eura Mccauslin has a mobility deficit which cannot be remediated with a cane or walker as she is unable to stand, transfer or ambulate due to L CVA with resultant flaccid right hemiplegia.  She currently requires total assistance and use of a hoyer lift for transfers with her husband; she stands and transfers with maximum/total assistance from her children and grandchildren who are only able to assist intermittently.  Most days, Liandra is bed bound.  Larisa is unable to propel any type of manual wheelchair due to the flaccid hemiplegia in her RUE and RLE, insufficient L shoulder ROM and strength and impaired cardiopulmonary endurance.  Jaquelyn's husband has multiple medical issues and lacks sufficient strength to push Deniya in a manual wheelchair.  Anaise has trialed an Transport planner in her home but was unable to safely steer with bilat UE  due to R flaccid hemiparesis and her home does not support the use of a POV, which resulted in Arabella running into furniture and walls.  Keelee would not be able to safely transfer onto and off of an electric scooter due to her lack of strength and balance to stand and step on to the scooter base.  Tangela also lacks sufficient strength and trunk stability to  maintain balance on a POV.  A power wheelchair will provide Caniya with more independent mobility in her home and will sufficiently improve her ability to participate and/or be aided in participation of her MRADL's.  Amatullah also requires the use specialty seating components and power functions to improve sitting balance, postural control, head righting, effective pressure relief, UE support and edema management, repositioning, ADLs and rest periods.            Anterior / Posterior Obliquity Rotation-Pelvis ?????  PELVIS    '[]'  '[x]'  '[]'   Neutral Posterior Anterior  '[]'  '[]'  '[x]'   WFL Rt elev Lt elev  '[]'  '[x]'  '[]'   WFL Right Left                      Anterior    Anterior     '[]'  Fixed '[]'  Other '[x]'  Partly Flexible '[]'  Flexible   '[]'  Fixed '[]'  Other '[x]'  Partly Flexible  '[]'  Flexible  '[]'  Fixed '[]'  Other '[x]'  Partly Flexible  '[]'  Flexible   TRUNK  '[]'  '[x]'  '[]'   WFL ? Thoracic ? Lumbar  Kyphosis Lordosis  '[]'  '[x]'  '[]'   WFL Convex Convex  Right Left '[x]' c-curve '[]' s-curve '[]' multiple  '[]'  Neutral '[x]'  Left-anterior '[]'  Right-anterior     '[]'  Fixed '[]'  Flexible '[x]'  Partly Flexible '[]'  Other  '[]'  Fixed '[]'  Flexible '[x]'  Partly Flexible '[]'  Other  '[]'  Fixed             '[]'  Flexible '[x]'  Partly Flexible '[]'  Other    Position Windswept  ?????  HIPS          '[x]'            '[]'               '[]'    Neutral       Abduct        ADduct         '[x]'           '[]'            '[]'   Neutral Right           Left      '[]'  Fixed '[]'  Subluxed '[]'  Partly Flexible '[]'  Dislocated '[]'  Flexible  '[]'  Fixed '[]'  Other '[]'  Partly Flexible  '[]'  Flexible                 Foot Positioning Knee Positioning   ?????    '[x]'  WFL  '[x]' Lt '[x]' Rt '[x]'  WFL  '[x]' Lt '[x]' Rt    KNEES ROM concerns: ROM concerns:    & Dorsi-Flexed '[]' Lt '[]' Rt ?????    FEET Plantar Flexed '[]' Lt '[]' Rt      Inversion                 '[]' Lt '[]' Rt      Eversion                 '[]' Lt '[]' Rt     HEAD '[]'  Functional '[]'  Good Head Control  ?????  & '[]'  Flexed         '[]'  Extended '[x]'  Adequate Head Control    NECK '[x]'  Rotated  Lt  '[x]'  Lat Flexed Lt '[]'  Rotated  Rt '[]'  Lat Flexed Rt '[]'  Limited Head Control     '[]'  Cervical Hyperextension '[]'  Absent  Head Control     SHOULDERS ELBOWS WRIST& HAND ?????      Left     Right    Left     Right  Left     Right   U/E '[]' Functional           '[]' Functional [facility] WFL '[]' Fisting             '[]' Fisting      '[]' elev   '[]' dep      '[x]' elev   '[]' dep       '[x]' pro -'[]' retract     '[x]' pro  '[]' retract '[]' subluxed             '[x]' subluxed           Goals for Wheelchair Mobility  '[x]'  Independence with mobility in the home with motor related ADLs (MRADLs)  '[]'  Independence with MRADLs in the community '[]'  Provide dependent mobility  '[x]'  Provide recline     '[x]' Provide tilt   Goals for Seating system '[x]'  Optimize pressure distribution '[x]'  Provide support needed to facilitate function or safety '[x]'  Provide corrective forces to assist with maintaining or improving posture '[]'  Accommodate client's posture:   current seated postures and positions are not flexible or will not tolerate corrective forces '[x]'  Client to be independent with relieving pressure in the wheelchair '[x]' Enhance physiological function such as breathing, swallowing, digestion  Simulation ideas/Equipment trials:Trialed electric scooter (POV) at home - see below; ATP trialed an electric wheelchair in her home.  State why other equipment was unsuccessful:With electric scooter (POV) patient ran into walls and furniture due to inability to utilize bilat UE to steer; unable to safely transfer and unable to balance on scooter.  When patient trialed electric wheelchair able to  access all rooms without difficulty.     MOBILITY BASE RECOMMENDATIONS and JUSTIFICATION: MOBILITY COMPONENT JUSTIFICATION  Manufacturer: [employer] Model: Edge 3   Size: Width 18Seat Depth 17 '[x]' provide transport from point A to B      '[x]' promote Indep mobility  '[x]' is not a safe, functional ambulator '[x]' walker or cane inadequate '[]' non-standard width/depth necessary to accommodate anatomical measurement '[]'  ?????  '[]' Manual Mobility Base '[]' non-functional ambulator    '[]' Scooter/POV  '[]' can safely operate  '[]' can safely transfer   '[]' has adequate trunk stability  '[]' cannot functionally propel manual w/c  '[x]' Power Mobility Base  '[x]' non-ambulatory  '[x]' cannot functionally propel manual wheelchair  '[x]'  cannot functionally and safely operate scooter/POV '[x]' can safely operate and willing to  '[]' Stroller Base '[]' infant/child  '[]' unable to propel manual wheelchair '[]' allows for growth '[]' non-functional ambulator '[]' non-functional UE '[]' Indep mobility is not a goal at this time  '[x]' Tilt  '[]' Forward '[x]' Backward '[x]' Powered tilt  '[]' Manual tilt  '[x]' change position against gravitational force on head and shoulders  '[x]' change position for pressure relief/cannot weight shift '[]' transfers  '[]' management of tone '[x]' rest periods '[]' control edema '[x]' facilitate postural control  '[]'  ?????  '[x]' Recline  '[x]' Power recline on power base '[]' Manual recline on manual base  '[]' accommodate femur to back angle  '[]' bring to full recline for ADL care  '[x]' change position for pressure relief/cannot weight shift '[x]' rest periods '[x]' repositioning for transfers or clothing/diaper /catheter changes '[]' head positioning  '[]' Lighter weight required '[]' self- propulsion  '[]' lifting '[]'  ?????  '[]' Heavy Duty required '[]' user weight greater than 250# '[]' extreme tone/ over active movement '[]' broken frame on previous chair '[]'  ?????  '[x]'  Back  '[]'  Angle Adjustable '[]'  Custom molded Trucomfort 2 positioning backrest '[x]' postural control '[]' control of  tone/spasticity '[x]' accommodation of range of motion '[]' UE functional control '[]' accommodation for seating system '[]'  ????? '[x]' provide lateral trunk support '[]' accommodate deformity '[x]' provide posterior trunk support '[x]' provide lumbar/sacral support '[x]' support trunk in midline '[x]' Pressure relief over spinal processes  '[x]'  Seat Cushion Stealth solution skin protection/positioning cushion '[]' impaired sensation  '[]'   decubitus ulcers present '[x]' history of pressure ulceration '[x]' prevent pelvic extension '[x]' low maintenance  '[x]' stabilize pelvis  '[x]' accommodate obliquity '[]' accommodate multiple deformity '[x]' neutralize lower extremity position '[x]' increase pressure distribution '[]'  ?????  '[]'  Pelvic/thigh support  '[]'  Lateral thigh guide '[]'  Distal medial pad  '[]'  Distal lateral pad '[]'  pelvis in neutral '[]' accommodate pelvis '[]'  position upper legs '[]'  alignment '[]'  accommodate ROM '[]'  decr adduction '[]' accommodate tone '[]' removable for transfers '[]' decr abduction  '[]'  Lateral trunk Supports '[]'  Lt     '[]'  Rt '[]' decrease lateral trunk leaning '[]' control tone '[]' contour for increased contact '[]' safety  '[]' accommodate asymmetry '[]'  ?????  '[x]'  Mounting hardware  '[]' lateral trunk supports  '[]' back   '[]' seat '[x]' headrest      '[]'  thigh support '[]' fixed   '[]' swing away '[]' attach seat platform/cushion to w/c frame '[]' attach back cushion to w/c frame '[]' mount postural supports '[x]' mount headrest  '[]' swing medial thigh support away '[]' swing lateral supports away for transfers  '[]'  ?????    Armrests  '[]' fixed '[x]' adjustable height '[]' removable   '[]' swing away  '[x]' flip back   '[]' reclining '[x]' full length pads '[]' desk    '[]' pads tubular  '[x]' provide support with elbow at 90   '[x]' provide support for w/c tray '[x]' change of height/angles for variable activities '[x]' remove for transfers '[x]' allow to come closer to table top '[x]' remove for access to tables '[]'  ?????  Hangers/ Leg rests  '[]' 60 '[]' 70 '[]' 90 '[x]' elevating '[]' heavy duty  '[x]' articulating  '[]' fixed '[]' lift off '[]' swing away     '[x]' power '[x]' provide LE support  '[]' accommodate to hamstring tightness '[x]' elevate legs during recline   '[x]' provide change in position for Legs '[x]' Maintain placement of feet on footplate '[]' durability '[x]' enable transfers '[]' decrease edema '[]' Accommodate lower leg length '[]'  ?????  Foot support Footplate    '[x]' Lt  '[x]'  Rt  '[]'  Center mount '[x]' flip up     '[x]' depth/angle adjustable '[]' Amputee adapter    '[]'  Lt     '[]'  Rt '[x]' provide foot support '[]' accommodate to ankle ROM '[x]' transfers '[]' Provide support for residual extremity '[]'  allow foot to go under wheelchair base '[]'  decrease tone  '[]'  ?????  '[]'  Ankle strap/heel loops '[]' support foot on foot support '[]' decrease extraneous movement '[]' provide input to heel  '[]' protect foot  Tires: '[x]' pneumatic  '[x]' flat free inserts  '[]' solid  '[x]' decrease maintenance  '[x]' prevent frequent flats '[]' increase shock absorbency '[]' decrease pain from road shock '[]' decrease spasms from road shock '[]'  ?????  '[x]'  Headrest  '[x]' provide posterior head support '[x]' provide posterior neck support '[]' provide lateral head support '[]' provide anterior head support '[x]' support during tilt and recline '[]' improve feeding   '[x]' improve respiration '[]' placement of switches '[]' safety  '[]' accommodate ROM  '[]' accommodate tone '[]' improve visual orientation  '[]'  Anterior chest strap '[]'  Vest '[]'  Shoulder retractors  '[]' decrease forward movement of shoulder '[]' accommodation of TLSO '[]' decrease forward movement of trunk '[]' decrease shoulder elevation '[]' added abdominal support '[]' alignment '[]' assistance with shoulder control  '[]'  ?????  Pelvic Positioner '[x]' Belt '[]' SubASIS bar '[]' Dual Pull '[]' stabilize tone '[x]' decrease falling out of chair/ **will not Decr potential for sliding due to pelvic tilting '[]' prevent excessive rotation '[]' pad for protection over boney prominence '[]' prominence comfort '[]' special pull angle to control rotation '[]'  ?????  Upper Extremity Support '[]' L    '[x]'  R '[]' Arm trough    '[]' hand support '[x]'  tray       '[]' full tray '[]' swivel mount '[x]' decrease edema      '[x]' decrease subluxation   '[]' control tone   '[]' placement for AAC/Computer/EADL '[x]' decrease gravitational pull on shoulders '[]' provide midline positioning '[]' provide support to increase UE function '[x]' provide hand support in natural position '[]' provide work surface   POWER  WHEELCHAIR CONTROLS  '[x]' Proportional  '[]' Non-Proportional Type Joystick '[x]' Left  '[]' Right '[x]' provides access for controlling wheelchair   '[]' lacks motor control to operate proportional drive control '[]' unable to understand proportional controls  Actuator Control Module  '[]' Single  '[x]' Multiple   '[x]' Allow the client to operate the power seat function(s) through the joystick control   '[]' Safety Reset Switches '[]' Used to change modes and stop the wheelchair when driving in latch mode    '[x]' Upgraded Electronics   '[x]' programming for accurate control '[]' progressive Disease/changing condition '[]' non-proportional drive control needed '[x]' Needed in order to operate power seat functions through joystick control   '[]' Display box '[]' Allows user to see in which mode and drive the wheelchair is set  '[]' necessary for alternate controls    '[]' Digital interface electronics '[]' Allows w/c to operate when using alternative drive controls  '[]' ASL Head Array '[]' Allows client to operate wheelchair  through switches placed in tri-panel headrest  '[]' Sip and puff with tubing kit '[]' needed to operate sip and puff drive controls  '[]' Upgraded tracking electronics '[]' increase safety when driving '[]' correct tracking when on uneven surfaces  '[x]' Mount for switches or joystick '[x]' Attaches switches to w/c  '[x]' Swing away for access or transfers '[]' midline for optimal placement '[]' provides for consistent access  '[]' Attendant controlled joystick plus mount '[]' safety '[]' long distance driving '[]' operation of seat functions '[]' compliance with transportation regulations '[]'  ?????     Rear wheel placement/Axle adjustability '[]' None '[]' semi adjustable '[]' fully adjustable  '[]' improved UE access to wheels '[]' improved stability '[]' changing angle in space for improvement of postural stability '[]' 1-arm drive access '[]' amputee pad placement '[]'  ?????  Wheel rims/ hand rims  '[]' metal  '[]' plastic coated '[]' oblique projections '[]' vertical projections '[]' Provide ability to propel manual wheelchair  '[]'  Increase self-propulsion with hand weakness/decreased grasp  Push handles '[]' extended  '[]' angle adjustable  '[]' standard '[]' caregiver access '[]' caregiver assist '[]' allows "hooking" to enable increased ability to perform ADLs or maintain balance  One armed device  '[]' Lt   '[]' Rt '[]' enable propulsion of manual wheelchair with one arm   '[]'  ?????   Brake/wheel lock extension '[]'  Lt   '[]'  Rt '[]' increase indep in applying wheel locks   '[x]'  Foot Plate Side Guards '[x]' prevent foot from sliding off the footplate '[x]'  prevent skin tears/abrasions  Battery: NF22 x 2 '[x]' to power wheelchair ?????  Other: ????? ????? ?????  The above equipment has a life- long use expectancy. Growth and changes in medical and/or functional conditions would be the exceptions. This is to certify that the therapist has no financial relationship with durable medical provider or manufacturer. The therapist will not receive remuneration of any kind for the equipment recommended in this evaluation.   Patient has mobility limitation that significantly impairs safe, timely participation in one or more mobility related ADL's.  (bathing, toileting, feeding, dressing, grooming, moving from room to room)                                                             '[x]'  Yes '[]'  No Will mobility device sufficiently improve ability to participate and/or be aided in participation of MRADL's?         '[x]'  Yes '[]'  No Can limitation be compensated for with use of a cane or walker?                                                                                '[]'   Yes  '[x]'  No Does patient or caregiver demonstrate ability/potential ability & willingness to safely use the mobility device?   '[x]'  Yes '[]'  No Does patient's home environment support use of recommended mobility device?                                                    '[x]'  Yes '[]'  No Does patient have sufficient upper extremity function necessary to functionally propel a manual wheelchair?    '[]'  Yes '[x]'  No Does patient have sufficient strength and trunk stability to safely operate a POV (scooter)?                                  '[]'  Yes '[x]'  No Does patient need additional features/benefits provided by a power wheelchair for MRADL's in the home?       '[x]'  Yes '[]'  No Does the patient demonstrate the ability to safely use a power wheelchair?                                                              '[x]'  Yes '[]'  No  Therapist Name Printed: Rico Junker, PT, DPT Date: 03/02/2019  Therapist's Signature:   Date:   Supplier's Name Printed: [employer] Date: 03/02/2019  Supplier's Signature:   Date:  Patient/Caregiver Signature:   Date:     This is to certify that I have read this evaluation and do agree with the content within:      Physician's Name Printed: Wannetta Sender, FNP  Physician's Signature:  Date:     This is to certify that I, the above signed therapist have the following affiliations: '[]'  This DME provider '[]'  Manufacturer of recommended equipment '[]'  Patient's long term care facility '[x]'  None of the above        PT Education - 03/02/19 1700    Education Details  see TA    Person(s) Educated  Patient;Child(ren)    Methods  Explanation    Comprehension  Verbalized understanding       PT Short Term Goals - 02/14/19 1945      PT SHORT TERM GOAL #1   Title  Pt and family will be independent with supine, seated and standing HEP. ALL STGS DUE 01/26/19    Baseline  02/02/19: dependent to date    Time  4    Period  Weeks    Status  On-going    Target Date   01/26/19      PT SHORT TERM GOAL #2   Title  Patient will ambulate 31' with mod A of one therapist. with RW and R AFO.    Baseline  02/02/19: pt does not have larger shoe to fit her AFO yet    Time  4    Period  Weeks    Status  Unable to assess    Target Date  01/26/19      PT SHORT TERM GOAL #3   Title  Patient will perform sit <> stand from mat table with R AFO and RW with  mod A of one therapist.    Baseline  02/02/19: pt does not have larger shoe to fit her AFO yet    Time  4    Period  Weeks    Status  Unable to assess    Target Date  01/26/19        PT Long Term Goals - 03/02/19 1710      PT LONG TERM GOAL #1   Title  Patient and family will be independent with final HEP recommendations. ALL LTGS DUE 02/23/19    Baseline  pt reports that she is trying to perform HEP at home when she can.    Time  8    Period  Weeks    Status  Not Met      PT LONG TERM GOAL #2   Title  Patient will perform sit <> stand from wheelchair with min A and transfer stand pivot with RW and AFO with Mod A of one person    Baseline  slow to progress due to pt not having the proper shoe size to wear R AFO. 02/21/2019 mod/max assist to rise with PT blocking right knee    Time  8    Period  Weeks    Status  Not Met      PT LONG TERM GOAL #3   Title  Pt will perform bed mobility on flat mat with min A    Baseline  02/21/2019 CGA/min assist sit to supine and mod assist supine to sit as needs mod assist to roll and then min assist to rise from sidelying    Time  8    Period  Weeks    Status  Not Met      PT LONG TERM GOAL #4   Title  Family will demonstrate independent ability to don appropriate AFO with R shoe    Baseline  pt needing max A from therapist to don R AFO due to improper shoe size/width    Time  8    Period  Weeks    Status  Not Met      PT LONG TERM GOAL #5   Title  Patient will ambulate 40 feet or greater with min A, AFO, and LRAD to safely navigate around home.    Baseline  slow to  progress due to pt not having the proper shoe size to wear R AFO    Time  8    Period  Weeks    Status  Not Met            Plan - 03/02/19 1711    Clinical Impression Statement  Treatment session today focused on power mobility evaluation - see LMN once completed.  Pt will benefit from power w/c with power seat functions to improve positioning, pressure relief and independent mobility in the home.  Unable to meet other LTG due to family still waiting on larger shoe to be delivered - so unable to perform further standing, transfer and gait training with new AFO due to unable to don.  Plan is for pt to D/C from outpatient therapy for now to participate in home health therapies and then return to outpatient once she receives shoe and power w/c.  Pt and family agreeable.    Personal Factors and Comorbidities  Age;Comorbidity 3+;Time since onset of injury/illness/exacerbation    Examination-Activity Limitations  Lift;Dressing;Self Feeding;Transfers;Toileting;Stand;Locomotion Level;Reach Overhead;Bed Mobility    Rehab Potential  Fair    PT Frequency  One time visit  PT Duration  Other (comment)   one time visit for w/c eval only   PT Treatment/Interventions  Therapeutic activities;Wheelchair mobility training;ADLs/Self Care Home Management;Patient/family education    PT Next Visit Plan  D/C    Consulted and Agree with Plan of Care  Patient    Family Member Consulted  daughter       Patient will benefit from skilled therapeutic intervention in order to improve the following deficits and impairments:  Pain, Postural dysfunction, Impaired tone, Decreased mobility, Decreased activity tolerance, Decreased range of motion, Decreased strength, Impaired UE functional use, Difficulty walking, Decreased balance, Abnormal gait, Decreased endurance  Visit Diagnosis: 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 Acute CVA (cerebrovascular accident) ([place]) 10/01/2018  . Physical deconditioning 12/19/2017  . Chronic kidney disease (CKD), stage III (moderate)   . Hypoalbuminemia due to protein-calorie malnutrition ([place])   . Intractable migraine without status migrainosus   . Reactive depression   . Hemiparesis affecting right side as late effect of stroke ([place])   . Slow transit constipation   . PAF (paroxysmal atrial fibrillation) ([place])   . Dysphasia, post-stroke   . COPD (chronic obstructive pulmonary disease) ([place]) 10/29/2017  . Chronic diastolic CHF (congestive heart failure) ([place]) 10/29/2017  . Hypothyroidism 10/29/2017  . Essential hypertension 10/29/2017  . GERD (gastroesophageal reflux disease) 10/29/2017  . Anxiety 10/29/2017  . 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 Hyperlipidemia 04/20/2013    PHYSICAL THERAPY DISCHARGE SUMMARY  Visits from Start of Care: 40  Current functional level related to goals / functional outcomes: See impression statement above, no LTG met; will D/C and participate in [facility]   Remaining deficits: R hemiplegia, impaired postural control, impaired balance, transfers and gait   Education / Equipment: HEP, AFO  Plan: Patient agrees to discharge.  Patient goals were not met. Patient is being discharged due to the patient's request.  ?????     Rico Junker, PT, DPT 03/02/19    5:26 PM    [facility] [address] [place], [place], [address] Phone: [phone]   Fax:  [phone]  Name: DALORES WEGER MRN: [id number] Date of Birth: September 29, 1945

## 2019-04-27 ENCOUNTER — Other Ambulatory Visit: Payer: Self-pay

## 2019-04-27 ENCOUNTER — Ambulatory Visit (INDEPENDENT_AMBULATORY_CARE_PROVIDER_SITE_OTHER): Payer: Medicare Other | Admitting: Cardiology

## 2019-04-27 ENCOUNTER — Encounter: Payer: Self-pay | Admitting: Cardiology

## 2019-04-27 VITALS — BP 118/72 | HR 85 | Ht 61.0 in

## 2019-04-27 DIAGNOSIS — I4892 Unspecified atrial flutter: Secondary | ICD-10-CM

## 2019-04-27 DIAGNOSIS — E782 Mixed hyperlipidemia: Secondary | ICD-10-CM | POA: Diagnosis not present

## 2019-04-27 DIAGNOSIS — R079 Chest pain, unspecified: Secondary | ICD-10-CM | POA: Diagnosis not present

## 2019-04-27 MED ORDER — APIXABAN 5 MG PO TABS: 5.0000 mg | ORAL_TABLET | Freq: Two times a day (BID) | ORAL | 1 refills | Status: DC

## 2019-04-27 MED ORDER — ATORVASTATIN CALCIUM 80 MG PO TABS: 80.0000 mg | ORAL_TABLET | Freq: Every day | ORAL | 1 refills | Status: DC

## 2019-04-27 NOTE — Progress Notes (Signed)
Clinical Summary Holly Flores is a 74 y.o.female today for follow up of the following medical problems.     1. 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 Repeat cath 10/2001 in setting of chest pain with LM normal, LAD [id number] mid systolic bridge, LCX patent, RCA patent. LVEF 60% by LVgram.   - no recent chest pain   2. 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 No acute findings on imaging. Symptoms resolved   3. Paroxysmal aflutter   - no recent palpitatios - compliant with meds. No bleeding on eliquis  5. HTN - she is compliant with meds  6. Hyperlipidemia - 09/2018 LDL 106   SH: husband passed last week, pancreatic cancer.      Past Medical History:  Diagnosis Date  . Anxiety   . Arthritis    "all over" (12/20/2017)  . Central retinal vein occlusion of left eye 01/22/2015  . CHF (congestive heart failure) ([place])   . Chronic bronchitis ([place])    "get it most years" (12/20/2017)  . Chronic lower back pain   . CKD (chronic kidney disease), stage II   . COPD (chronic obstructive pulmonary disease) ([place])   . CVA (cerebral vascular accident) ([place]) 12/30/2014   OCULAR  LEFT  EYE   . Depression   . Fibromyalgia   . GERD (gastroesophageal reflux disease)   . Goiter   . History of colonic polyps 05/14/2014  . Hyperlipidemia   . Hypertension   . Hypothyroidism   . Kidney cysts    left side  . 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 Migraine    "couple times/wk; been having them for a long time" (12/20/2017)  . Myocardial infarction [facility])    "one dr said I did; one said I didn't;  don't remember when it was" (12/20/2017)  . 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 On home oxygen therapy    "3L; sleep w/it q night" (12/20/2017)  . Pneumonia    "several times" (12/20/2017)  . Shortness of breath   . Sleep apnea    "stopped mask when I got on the  oxygen" (12/20/2017)  . Stroke [facility])      Allergies  Allergen Reactions  . Penicillins Hives, Itching and Rash    Has patient had a PCN reaction causing immediate rash, facial/tongue/throat swelling, SOB or lightheadedness with hypotension: Yes Has patient had a PCN reaction causing severe rash involving mucus membranes or skin necrosis: Yes Has patient had a PCN reaction that required hospitalization: No Has patient had a PCN reaction occurring within the last 10 years: No If all of the above answers are "NO", then may proceed with Cephalosporin use.   . Sulfa Antibiotics Swelling    Tongue swelling     Current Outpatient Medications  Medication Sig Dispense Refill  . albuterol (PROVENTIL HFA;VENTOLIN HFA) 108 (90 Base) MCG/ACT inhaler Inhale 2 puffs into the lungs every 6 (six) hours as needed (for wheezing/shortness of breath). (Patient taking differently: Inhale 2 puffs into the lungs every 4 (four) hours as needed (for wheezing/shortness of breath). ) 1 Inhaler 1  . ALPRAZolam (XANAX) 1 MG tablet Take 1 mg by mouth 2 (two) times daily as needed for anxiety.    Marland Kitchen amLODipine (NORVASC) 5 MG tablet Take 5 mg by mouth daily.    Marland Kitchen apixaban (ELIQUIS) 5 MG TABS tablet Take 1 tablet (5  mg total) by mouth 2 (two) times daily. 60 tablet 3  . atorvastatin (LIPITOR) 40 MG tablet Take 1 tablet (40 mg total) by mouth daily. 90 tablet 1  . cyclobenzaprine (FLEXERIL) 5 MG tablet Take 1 tablet (5 mg total) by mouth at bedtime. 30 tablet 0  . hydroxypropyl methylcellulose / hypromellose (ISOPTO TEARS / GONIOVISC) 2.5 % ophthalmic solution Place 1 drop into both eyes as needed for dry eyes.    . metoprolol succinate (TOPROL-XL) 25 MG 24 hr tablet Take 1 tablet (25 mg total) by mouth daily. 90 tablet 1  . naproxen (NAPROSYN) 500 MG tablet Take 500 mg by mouth 2 (two) times daily as needed for mild pain or moderate pain (with food/milk).    . potassium chloride SA (K-DUR,KLOR-CON) 20 MEQ tablet Take 1  tablet (20 mEq total) by mouth daily. (Patient taking differently: Take 20 mEq by mouth 2 (two) times daily. ) 30 tablet 1   No current facility-administered medications for this visit.     Past Surgical History:  Procedure Laterality Date  . ANTERIOR CERVICAL DECOMP/DISCECTOMY FUSION    . BACK SURGERY    . BREAST BIOPSY     left-non cancerous  . CARDIAC CATHETERIZATION    . CARPAL TUNNEL RELEASE Left   . CATARACT EXTRACTION W/PHACO  09/21/2011   Procedure: CATARACT EXTRACTION PHACO AND INTRAOCULAR LENS PLACEMENT (I[facility]);  Surgeon: Williams Che, MD;  Location: [facility];  Service: Ophthalmology;  Laterality: Left;  CDE:9.78  . CATARACT EXTRACTION W/PHACO Right 10/15/2014   Procedure: CATARACT EXTRACTION PHACO AND INTRAOCULAR LENS PLACEMENT ([place]);  Surgeon: Williams Che, MD;  Location: [facility];  Service: Ophthalmology;  Laterality: Right;  CDE:4.19  . [place]; 1972  . CHOLECYSTECTOMY OPEN    . COLONOSCOPY  2006   RMR: 1. Internal hemorroids, otherwise normal rectum. 2. Pedunculated polyp at 35 cm. reomved with snare. The remainder of teh colonic mucosa appeared normal.   . COLONOSCOPY  2011   Dr. Gala Romney: multiple ascending colon polyps and rectal polyp, adenomatous  . COLONOSCOPY N/A 05/31/2014   Procedure: COLONOSCOPY;  Surgeon: Daneil Dolin, MD;  Location: [facility];  Service: Endoscopy;  Laterality: N/A;  130  . COLONOSCOPY WITH PROPOFOL N/A 06/24/2017   Procedure: COLONOSCOPY WITH PROPOFOL;  Surgeon: Daneil Dolin, MD;  [facility];  Service: Endoscopy;  Laterality: N/A;  2:15pm  . ESOPHAGOGASTRODUODENOSCOPY  2006   Dr. Gala Romney: Subtle Schatzkis ring, otherwise normal upper GI tract, aside from a small pyloric channell erosion, status post dilation as described above.   Marland Kitchen FLEXIBLE SIGMOIDOSCOPY N/A 11/11/2017   Procedure: FLEXIBLE SIGMOIDOSCOPY;  Surgeon: Otis Brace, MD;  Location: [facility];  Service: Gastroenterology;  Laterality: N/A;  .  JOINT REPLACEMENT    . LUMBAR DISC SURGERY  X 2  . POLYPECTOMY  06/24/2017   Procedure: POLYPECTOMY;  Surgeon: Daneil Dolin, MD;  [facility];  Service: Endoscopy;;  ascending  . POSTERIOR LUMBAR FUSION  ? date 1st fusion ; 07/2006   previous L4-5 Ray threaded fusion cage; Exploration of L4-5 fusion & PLIF 07/22/2006/notes 07/22/2006  . TONSILLECTOMY    . TOTAL KNEE ARTHROPLASTY Right 05/24/2013   Procedure: TOTAL KNEE ARTHROPLASTY;  Surgeon: [name], MD;  Location: [facility];  Service: Orthopedics;  Laterality: Right;  . TOTAL KNEE ARTHROPLASTY Left   . VAGINAL HYSTERECTOMY       Allergies  Allergen Reactions  . Penicillins Hives, Itching and Rash  Has patient had a PCN reaction causing immediate rash, facial/tongue/throat swelling, SOB or lightheadedness with hypotension: Yes Has patient had a PCN reaction causing severe rash involving mucus membranes or skin necrosis: Yes Has patient had a PCN reaction that required hospitalization: No Has patient had a PCN reaction occurring within the last 10 years: No If all of the above answers are "NO", then may proceed with Cephalosporin use.   . Sulfa Antibiotics Swelling    Tongue swelling      Family History  Problem Relation Age of Onset  . Cancer Other   . Diabetes Other   . Heart disease Other   . Hypertension Other   . Asthma Other   . Alcoholism Other   . Thyroid disease Other   . Rheumatologic disease Other   . Stroke Other   . Hypercholesterolemia Other   . Colon cancer Neg Hx      Social History Ms. Dettinger reports that she quit smoking about 32 years ago. Her smoking use included cigarettes. She has a 5.00 pack-year smoking history. She has never used smokeless tobacco. Ms. Sobotta reports previous alcohol use.   Review of Systems CONSTITUTIONAL: No weight loss, fever, chills, weakness or fatigue.  HEENT: Eyes: No visual loss, blurred vision, double vision or yellow sclerae.No hearing loss,  sneezing, congestion, runny nose or sore throat.  SKIN: No rash or itching.  CARDIOVASCULAR: per hpi RESPIRATORY: No shortness of breath, cough or sputum.  GASTROINTESTINAL: No anorexia, nausea, vomiting or diarrhea. No abdominal pain or blood.  GENITOURINARY: No burning on urination, no polyuria NEUROLOGICAL: chronic weakness MUSCULOSKELETAL: No muscle, back pain, joint pain or stiffness.  LYMPHATICS: No enlarged nodes. No history of splenectomy.  PSYCHIATRIC: No history of depression or anxiety.  ENDOCRINOLOGIC: No reports of sweating, cold or heat intolerance. No polyuria or polydipsia.  Marland Kitchen   Physical Examination Today's Vitals   04/27/19 1527  BP: 118/72  Pulse: 85  SpO2: 96%  Height: 5\' 1"  (1.549 m)   Body mass index is 34.96 kg/m.  Gen: resting comfortably, no acute distress HEENT: no scleral icterus, pupils equal round and reactive, no palptable cervical adenopathy,  CV: RRR, no m/r/g,no jvd Resp: Clear to auscultation bilaterally GI: abdomen is soft, non-tender, non-distended, normal bowel sounds, no hepatosplenomegaly MSK: extremities are warm, no edema.  Skin: warm, no rash Psych: appropriate affect   Diagnostic Studies 10/2011 cath HEMODYNAMICS: Aortic systolic pressure: [id number].  Aortic diastolic pressure: 78.  Left ventricular systolic pressure: 123456.  Left ventricular diastolic pressure: 18.  CORONARY ANGIOGRAPHY:  1. Left main: The left main normal.  2. LAD: The LAD had a [id number] mid systolic bridge after a diagonal Brittin [place].  3. Left circumflex coronary artery: Free of significant disease.  4. Right coronary artery: This a large dominant vessel which was free of  significant disease.  LEFT VENTRICULOGRAPHY: The RAO left ventriculogram was performed using 25  cc of Omnipaque dye at 12 cc per second. The overall LVEF was estimated at  greater than 60% without focal wall motion abnormality.  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 I suspect that her chest  pain is related to cholelithiasis.  The sheath was removed and pressure held on the groin to achieve hemostasis.  The patient left the cath lab in stable condition.  DISPOSITION: She will be discharged home today to be followed  as an  outpatient. She will see me back in the office in several weeks. She is  cleared to undergo an elective cholecystectomy, at a low cardiovascular  risk. She left the laboratory in stable condition. Dr. Octavio Graves was  notified of these results.   12/2014 echo ------------------------------------------------------------------- Study Conclusions  - Left ventricle: The cavity size was normal. Wall thickness was normal. Systolic function was vigorous. The estimated ejection fraction was in the range of 65% to 70%. Doppler parameters are consistent with abnormal left ventricular relaxation (grade 1 diastolic dysfunction). - Left atrium: The atrium was mildly dilated.   06/2017 echo Study Conclusions  - Left ventricle: The cavity size was normal. Wall thickness was increased in a pattern of mild LVH. Systolic function was normal. The estimated ejection fraction was in the range of 60% to 65%. Wall motion was normal; there were no regional wall motion abnormalities. Doppler parameters are consistent with abnormal left ventricular relaxation (grade 1 diastolic dysfunction). - Mitral valve: Mildly calcified annulus. There was trivial regurgitation. - Right atrium: Central venous pressure (est): 3 mm Hg. - Atrial septum: No defect or patent foramen ovale was identified. - Tricuspid valve: There was trivial regurgitation. - Pulmonary arteries: PA peak pressure: 7 mm Hg (S). - Pericardium, extracardiac: There was no pericardial effusion.    Assessment and Plan    1.  HTN - at goal, continue current meds  2. Chest pain - long history with prior negative ischemic evaluations.  - no recent symptoms, continue to monitor  3. Hyperlipidemia - goal LDL <70, no there. Increase atorvastatin to 80mg  daily.    F/u 6 months  Arnoldo Lenis, M.D.

## 2019-04-27 NOTE — Patient Instructions (Signed)
Your physician wants you to follow-up in: [place] will receive a reminder letter in the mail two months in advance. If you don't receive a letter, please call our office to schedule the follow-up appointment.  Your physician has recommended you make the following change in your medication:   INCREASE ATORVASTATIN 80 MG DAILY   Thank you for choosing [facility]!!

## 2019-10-30 ENCOUNTER — Other Ambulatory Visit: Payer: Self-pay

## 2019-10-30 ENCOUNTER — Encounter: Payer: Self-pay | Admitting: Cardiology

## 2019-10-30 ENCOUNTER — Ambulatory Visit (INDEPENDENT_AMBULATORY_CARE_PROVIDER_SITE_OTHER): Payer: Medicare Other | Admitting: Cardiology

## 2019-10-30 VITALS — BP 144/84 | HR 86 | Ht 61.0 in

## 2019-10-30 DIAGNOSIS — I4892 Unspecified atrial flutter: Secondary | ICD-10-CM

## 2019-10-30 DIAGNOSIS — E782 Mixed hyperlipidemia: Secondary | ICD-10-CM

## 2019-10-30 DIAGNOSIS — G4730 Sleep apnea, unspecified: Secondary | ICD-10-CM | POA: Diagnosis not present

## 2019-10-30 DIAGNOSIS — I10 Essential (primary) hypertension: Secondary | ICD-10-CM

## 2019-10-30 DIAGNOSIS — R079 Chest pain, unspecified: Secondary | ICD-10-CM

## 2019-10-30 MED ORDER — AMLODIPINE BESYLATE 10 MG PO TABS: 10.0000 mg | ORAL_TABLET | Freq: Every day | ORAL | 3 refills | Status: DC

## 2019-10-30 MED ORDER — METOPROLOL SUCCINATE ER 25 MG PO TB24: 25.0000 mg | ORAL_TABLET | Freq: Every day | ORAL | 3 refills | Status: AC

## 2019-10-30 MED ORDER — TORSEMIDE 20 MG PO TABS
20.0000 mg | ORAL_TABLET | Freq: Every day | ORAL | 3 refills | Status: DC
Start: 2019-10-30 — End: 2020-01-09

## 2019-10-30 MED ORDER — ATORVASTATIN CALCIUM 80 MG PO TABS: 80.0000 mg | ORAL_TABLET | Freq: Every day | ORAL | 3 refills | Status: AC

## 2019-10-30 MED ORDER — APIXABAN 5 MG PO TABS: 5.0000 mg | ORAL_TABLET | Freq: Two times a day (BID) | ORAL | 3 refills | Status: AC

## 2019-10-30 NOTE — Addendum Note (Signed)
Addended by: Barbarann Ehlers A on: 10/30/2019 03:09 PM   Modules accepted: Orders

## 2019-10-30 NOTE — Progress Notes (Signed)
Clinical Summary Holly Flores is a 74 y.o.female seen today for follow up of the following medical problems.    1. 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 Repeat cath 10/2001 in setting of chest pain with LM normal, LAD 42-59% mid systolic bridge, LCX patent, RCA patent. LVEF 60% by LVgram.   - chronic chest pains unchanged   2.History of CVA - prior stroke 10/2017 -more recentlyadmitted 09/2018 with headache, trouble speaking. No acute findings on imaging. Symptoms resolved   3. Paroxysmal aflutter  - rare infrequent palpitations.    5. HTN - ran out of bp meds  6. Hyperlipidemia - 09/2018 LDL 106 - labs followed by pcp  7. Foot/ankle fracture - injury June 2021, managed without surgery  8. OSA - not using cpap, has not been followed in several years - + daytime somnolence.   SH: husband passed previouslly from pancreatic cancer.    Past Medical History:  Diagnosis Date  . Anxiety   . Arthritis    "all over" (12/20/2017)  . Central retinal vein occlusion of left eye 01/22/2015  . CHF (congestive heart failure) ([place])   . Chronic bronchitis ([place])    "get it most years" (12/20/2017)  . Chronic lower back pain   . CKD (chronic kidney disease), stage II   . COPD (chronic obstructive pulmonary disease) ([place])   . CVA (cerebral vascular accident) ([place]) 12/30/2014   OCULAR  LEFT  EYE   . Depression   . Fibromyalgia   . GERD (gastroesophageal reflux disease)   . Goiter   . History of colonic polyps 05/14/2014  . Hyperlipidemia   . Hypertension   . Hypothyroidism   . Kidney cysts    left side  . 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 Migraine    "couple times/wk; been having them for a long time" (12/20/2017)  . Myocardial infarction [facility])    "one dr said I did; one said I didn't;  don't remember when it was" (12/20/2017)  . 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 On home oxygen therapy      "3L; sleep w/it q night" (12/20/2017)  . Pneumonia    "several times" (12/20/2017)  . Shortness of breath   . Sleep apnea    "stopped mask when I got on the oxygen" (12/20/2017)  . Stroke [facility])      Allergies  Allergen Reactions  . Penicillins Hives, Itching and Rash    Has patient had a PCN reaction causing immediate rash, facial/tongue/throat swelling, SOB or lightheadedness with hypotension: Yes Has patient had a PCN reaction causing severe rash involving mucus membranes or skin necrosis: Yes Has patient had a PCN reaction that required hospitalization: No Has patient had a PCN reaction occurring within the last 10 years: No If all of the above answers are "NO", then may proceed with Cephalosporin use.   . Sulfa Antibiotics Swelling    Tongue swelling     Current Outpatient Medications  Medication Sig Dispense Refill  . albuterol (PROVENTIL HFA;VENTOLIN HFA) 108 (90 Base) MCG/ACT inhaler Inhale 2 puffs into the lungs every 6 (six) hours as needed (for wheezing/shortness of breath). (Patient taking differently: Inhale 2 puffs into the lungs every 4 (four) hours as needed (for wheezing/shortness of breath). ) 1 Inhaler 1  . ALPRAZolam (XANAX) 1 MG tablet Take 1 mg by mouth 2 (two) times daily as needed for anxiety.    Marland Kitchen amLODipine (  NORVASC) 5 MG tablet Take 5 mg by mouth daily.    Marland Kitchen apixaban (ELIQUIS) 5 MG TABS tablet Take 1 tablet (5 mg total) by mouth 2 (two) times daily. 180 tablet 1  . atorvastatin (LIPITOR) 80 MG tablet Take 1 tablet (80 mg total) by mouth daily. 90 tablet 1  . cyclobenzaprine (FLEXERIL) 5 MG tablet Take 1 tablet (5 mg total) by mouth at bedtime. 30 tablet 0  . hydroxypropyl methylcellulose / hypromellose (ISOPTO TEARS / GONIOVISC) 2.5 % ophthalmic solution Place 1 drop into both eyes as needed for dry eyes.    . metoprolol succinate (TOPROL-XL) 25 MG 24 hr tablet Take 1 tablet (25 mg total) by mouth daily. 90 tablet 1  . naproxen (NAPROSYN) 500 MG tablet Take  500 mg by mouth 2 (two) times daily as needed for mild pain or moderate pain (with food/milk).    . potassium chloride SA (K-DUR,KLOR-CON) 20 MEQ tablet Take 1 tablet (20 mEq total) by mouth daily. (Patient taking differently: Take 20 mEq by mouth 2 (two) times daily. ) 30 tablet 1   No current facility-administered medications for this visit.     Past Surgical History:  Procedure Laterality Date  . ANTERIOR CERVICAL DECOMP/DISCECTOMY FUSION    . BACK SURGERY    . BREAST BIOPSY     left-non cancerous  . CARDIAC CATHETERIZATION    . CARPAL TUNNEL RELEASE Left   . CATARACT EXTRACTION W/PHACO  09/21/2011   Procedure: CATARACT EXTRACTION PHACO AND INTRAOCULAR LENS PLACEMENT (IOC);  Surgeon: Williams Che, MD;  Location: [facility];  Service: Ophthalmology;  Laterality: Left;  CDE:9.78  . CATARACT EXTRACTION W/PHACO Right 10/15/2014   Procedure: CATARACT EXTRACTION PHACO AND INTRAOCULAR LENS PLACEMENT ([place]);  Surgeon: Williams Che, MD;  Location: [facility];  Service: Ophthalmology;  Laterality: Right;  CDE:4.19  . [place]; 1972  . CHOLECYSTECTOMY OPEN    . COLONOSCOPY  2006   RMR: 1. Internal hemorroids, otherwise normal rectum. 2. Pedunculated polyp at 35 cm. reomved with snare. The remainder of teh colonic mucosa appeared normal.   . COLONOSCOPY  2011   Dr. Gala Romney: multiple ascending colon polyps and rectal polyp, adenomatous  . COLONOSCOPY N/A 05/31/2014   Procedure: COLONOSCOPY;  Surgeon: Daneil Dolin, MD;  Location: [facility];  Service: Endoscopy;  Laterality: N/A;  130  . COLONOSCOPY WITH PROPOFOL N/A 06/24/2017   Procedure: COLONOSCOPY WITH PROPOFOL;  Surgeon: Daneil Dolin, MD;  [facility];  Service: Endoscopy;  Laterality: N/A;  2:15pm  . ESOPHAGOGASTRODUODENOSCOPY  2006   Dr. Gala Romney: Subtle Schatzkis ring, otherwise normal upper GI tract, aside from a small pyloric channell erosion, status post dilation as described above.   Marland Kitchen FLEXIBLE SIGMOIDOSCOPY N/A  11/11/2017   Procedure: FLEXIBLE SIGMOIDOSCOPY;  Surgeon: Otis Brace, MD;  Location: [place];  Service: Gastroenterology;  Laterality: N/A;  . JOINT REPLACEMENT    . LUMBAR DISC SURGERY  X 2  . POLYPECTOMY  06/24/2017   Procedure: POLYPECTOMY;  Surgeon: Daneil Dolin, MD;  Location[facility];  Service: Endoscopy;;  ascending  . POSTERIOR LUMBAR FUSION  ? date 1st fusion ; 07/2006   previous L4-5 Ray threaded fusion cage; Exploration of L4-5 fusion & PLIF 07/22/2006/notes 07/22/2006  . TONSILLECTOMY    . TOTAL KNEE ARTHROPLASTY Right 05/24/2013   Procedure: TOTAL KNEE ARTHROPLASTY;  Surgeon: [name], MD;  Location: [place];  Service: Orthopedics;  Laterality: Right;  . TOTAL KNEE  ARTHROPLASTY Left   . VAGINAL HYSTERECTOMY       Allergies  Allergen Reactions  . Penicillins Hives, Itching and Rash    Has patient had a PCN reaction causing immediate rash, facial/tongue/throat swelling, SOB or lightheadedness with hypotension: Yes Has patient had a PCN reaction causing severe rash involving mucus membranes or skin necrosis: Yes Has patient had a PCN reaction that required hospitalization: No Has patient had a PCN reaction occurring within the last 10 years: No If all of the above answers are "NO", then may proceed with Cephalosporin use.   . Sulfa Antibiotics Swelling    Tongue swelling      Family History  Problem Relation Age of Onset  . Cancer Other   . Diabetes Other   . Heart disease Other   . Hypertension Other   . Asthma Other   . Alcoholism Other   . Thyroid disease Other   . Rheumatologic disease Other   . Stroke Other   . Hypercholesterolemia Other   . Colon cancer Neg Hx      Social History Ms. Traum reports that she quit smoking about 32 years ago. Her smoking use included cigarettes. She has a 5.00 pack-year smoking history. She has never used smokeless tobacco. Ms. Figiel reports previous alcohol use.   Review of  Systems CONSTITUTIONAL: No weight loss, fever, chills, weakness or fatigue.  HEENT: Eyes: No visual loss, blurred vision, double vision or yellow sclerae.No hearing loss, sneezing, congestion, runny nose or sore throat.  SKIN: No rash or itching.  CARDIOVASCULAR: per hpi RESPIRATORY: No shortness of breath, cough or sputum.  GASTROINTESTINAL: No anorexia, nausea, vomiting or diarrhea. No abdominal pain or blood.  GENITOURINARY: No burning on urination, no polyuria NEUROLOGICAL: No headache, dizziness, syncope, paralysis, ataxia, numbness or tingling in the extremities. No change in bowel or bladder control.  MUSCULOSKELETAL: No muscle, back pain, joint pain or stiffness.  LYMPHATICS: No enlarged nodes. No history of splenectomy.  PSYCHIATRIC: No history of depression or anxiety.  ENDOCRINOLOGIC: No reports of sweating, cold or heat intolerance. No polyuria or polydipsia.  Marland Kitchen   Physical Examination Today's Vitals   10/30/19 1349  BP: (!) 144/84  Pulse: 86  SpO2: 95%  Height: 5\' 1"  (1.549 m)   Body mass index is 34.96 kg/m.  Gen: resting comfortably, no acute distress HEENT: no scleral icterus, pupils equal round and reactive, no palptable cervical adenopathy,  CV: RRR, no m/r/g no jvd Resp: Clear to auscultation bilaterally GI: abdomen is soft, non-tender, non-distended, normal bowel sounds, no hepatosplenomegaly MSK: extremities are warm, no edema.  Skin: warm, no rash Neuro:  no focal deficits Psych: appropriate affect   Diagnostic Studies 10/2011 cath HEMODYNAMICS: Aortic systolic pressure: 740.  Aortic diastolic pressure: 78.  Left ventricular systolic pressure: 814.  Left ventricular diastolic pressure: 18.  CORONARY ANGIOGRAPHY:  1. Left main: The left main normal.  2. LAD: The LAD had a 48%-18% mid systolic bridge after a diagonal Donelle Hise.  3. Left circumflex coronary artery: Free of significant disease.  4. Right coronary artery: This a large dominant vessel which  was free of  significant disease.  LEFT VENTRICULOGRAPHY: The RAO left ventriculogram was performed using 25  cc of Omnipaque dye at 12 cc per second. The overall LVEF was estimated at  greater than 60% without focal wall motion abnormality.  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 I suspect that her chest  pain is related to cholelithiasis.  The sheath was removed and pressure held on the groin to achieve hemostasis.  The patient left the cath lab in stable condition.  DISPOSITION: She will be discharged home today to be followed as an  outpatient. She will see me back in the office in several weeks. She is  cleared to undergo an elective cholecystectomy, at a low cardiovascular  risk. She left the laboratory in stable condition. Dr. Octavio Graves was  notified of these results.   12/2014 echo ------------------------------------------------------------------- Study Conclusions  - Left ventricle: The cavity size was normal. Wall thickness was normal. Systolic function was vigorous. The estimated ejection fraction was in the range of 65% to 70%. Doppler parameters are consistent with abnormal left ventricular relaxation (grade 1 diastolic dysfunction). - Left atrium: The atrium was mildly dilated.   06/2017 echo Study Conclusions  - Left ventricle: The cavity size was normal. Wall thickness was increased in a pattern of mild LVH. Systolic function was normal. The estimated ejection fraction was in the range of 60% to 65%. Wall motion was normal; there were no regional wall motion abnormalities. Doppler parameters are consistent with abnormal left ventricular relaxation (grade 1 diastolic dysfunction). - Mitral valve: Mildly calcified annulus. There was trivial regurgitation. - Right atrium:  Central venous pressure (est): 3 mm Hg. - Atrial septum: No defect or patent foramen ovale was identified. - Tricuspid valve: There was trivial regurgitation. - Pulmonary arteries: PA peak pressure: 7 mm Hg (S). - Pericardium, extracardiac: There was no pericardial effusion.    Assessment and Plan  1. HTN - elevated but has run out of home bp meds, we will refill  2. Chest pain - long history with prior negative ischemic evaluations.  - chronic stable symptoms, continue to monitor.   3. Hyperlipidemia -continue statin, request labs from pcp  EKG today shows SR, no ischemic changes  4. OSA - prior hsitory, has not been wearing her cpap. Has not had repeat eval in seveal years - refer to pulmonary  5. Aflutter - cotninue current meds, no significant symptoms  Arnoldo Lenis, M.D.,

## 2019-10-30 NOTE — Patient Instructions (Signed)
Medication Instructions:  Your physician recommends that you continue on your current medications as directed. Please refer to the Current Medication list given to you today.  *If you need a refill on your cardiac medications before your next appointment, please call your pharmacy*   Lab Work: None today If you have labs (blood work) drawn today and your tests are completely normal, you will receive your results only by: Marland Kitchen MyChart Message (if you have MyChart) OR . A paper copy in the mail If you have any lab test that is abnormal or we need to change your treatment, we will call you to review the results.   Testing/Procedures: None today   Follow-Up: At [facility], you and your health needs are our priority.  As part of our continuing mission to provide you with exceptional heart care, we have created designated Provider Care Teams.  These Care Teams include your primary Cardiologist (physician) and Advanced Practice Providers (APPs -  Physician Assistants and Nurse Practitioners) who all work together to provide you with the care you need, when you need it.  We recommend signing up for the patient portal called "MyChart".  Sign up information is provided on this After Visit Summary.  MyChart is used to connect with patients for Virtual Visits (Telemedicine).  Patients are able to view lab/test results, encounter notes, upcoming appointments, etc.  Non-urgent messages can be sent to your provider as well.   To learn more about what you can do with MyChart, go to [email or web address].    Your next appointment:   6 month(s)  The format for your next appointment:   In Person  Provider:   Carlyle Dolly, MD   Other Instructions We have placed a referral Pulmonology in [place]. They will call you for an appointment.          Thank you for choosing [facility] !

## 2019-12-25 ENCOUNTER — Institutional Professional Consult (permissible substitution): Payer: Medicare Other | Admitting: Pulmonary Disease

## 2019-12-29 ENCOUNTER — Other Ambulatory Visit: Payer: Self-pay

## 2019-12-29 ENCOUNTER — Emergency Department (HOSPITAL_COMMUNITY): Payer: Medicare Other

## 2019-12-29 ENCOUNTER — Encounter (HOSPITAL_COMMUNITY): Payer: Self-pay | Admitting: Emergency Medicine

## 2019-12-29 ENCOUNTER — Inpatient Hospital Stay (HOSPITAL_COMMUNITY)
Admission: EM | Admit: 2019-12-29 | Discharge: 2020-01-02 | DRG: 291 | Disposition: A | Discharge status: Home Health | Payer: Medicare Other | Attending: Family Medicine | Admitting: Family Medicine

## 2019-12-29 DIAGNOSIS — I5033 Acute on chronic diastolic (congestive) heart failure: Secondary | ICD-10-CM | POA: Diagnosis present

## 2019-12-29 DIAGNOSIS — R601 Generalized edema: Secondary | ICD-10-CM | POA: Diagnosis not present

## 2019-12-29 DIAGNOSIS — G8929 Other chronic pain: Secondary | ICD-10-CM | POA: Diagnosis present

## 2019-12-29 DIAGNOSIS — I130 Hypertensive heart and chronic kidney disease with heart failure and stage 1 through stage 4 chronic kidney disease, or unspecified chronic kidney disease: Secondary | ICD-10-CM | POA: Diagnosis not present

## 2019-12-29 DIAGNOSIS — I10 Essential (primary) hypertension: Secondary | ICD-10-CM | POA: Diagnosis present

## 2019-12-29 DIAGNOSIS — Z96653 Presence of artificial knee joint, bilateral: Secondary | ICD-10-CM | POA: Diagnosis present

## 2019-12-29 DIAGNOSIS — M797 Fibromyalgia: Secondary | ICD-10-CM | POA: Diagnosis present

## 2019-12-29 DIAGNOSIS — N1831 Chronic kidney disease, stage 3a: Secondary | ICD-10-CM | POA: Diagnosis present

## 2019-12-29 DIAGNOSIS — I69351 Hemiplegia and hemiparesis following cerebral infarction affecting right dominant side: Secondary | ICD-10-CM

## 2019-12-29 DIAGNOSIS — Z823 Family history of stroke: Secondary | ICD-10-CM

## 2019-12-29 DIAGNOSIS — Z23 Encounter for immunization: Secondary | ICD-10-CM

## 2019-12-29 DIAGNOSIS — Z7401 Bed confinement status: Secondary | ICD-10-CM

## 2019-12-29 DIAGNOSIS — Z87891 Personal history of nicotine dependence: Secondary | ICD-10-CM

## 2019-12-29 DIAGNOSIS — Z961 Presence of intraocular lens: Secondary | ICD-10-CM | POA: Diagnosis present

## 2019-12-29 DIAGNOSIS — Z9841 Cataract extraction status, right eye: Secondary | ICD-10-CM

## 2019-12-29 DIAGNOSIS — J449 Chronic obstructive pulmonary disease, unspecified: Secondary | ICD-10-CM | POA: Diagnosis present

## 2019-12-29 DIAGNOSIS — I509 Heart failure, unspecified: Secondary | ICD-10-CM

## 2019-12-29 DIAGNOSIS — K219 Gastro-esophageal reflux disease without esophagitis: Secondary | ICD-10-CM | POA: Diagnosis present

## 2019-12-29 DIAGNOSIS — I4892 Unspecified atrial flutter: Secondary | ICD-10-CM | POA: Diagnosis present

## 2019-12-29 DIAGNOSIS — Z880 Allergy status to penicillin: Secondary | ICD-10-CM

## 2019-12-29 DIAGNOSIS — F32A Depression, unspecified: Secondary | ICD-10-CM | POA: Diagnosis present

## 2019-12-29 DIAGNOSIS — E785 Hyperlipidemia, unspecified: Secondary | ICD-10-CM | POA: Diagnosis present

## 2019-12-29 DIAGNOSIS — N183 Chronic kidney disease, stage 3 unspecified: Secondary | ICD-10-CM | POA: Diagnosis present

## 2019-12-29 DIAGNOSIS — Z981 Arthrodesis status: Secondary | ICD-10-CM

## 2019-12-29 DIAGNOSIS — E039 Hypothyroidism, unspecified: Secondary | ICD-10-CM | POA: Diagnosis present

## 2019-12-29 DIAGNOSIS — Z79899 Other long term (current) drug therapy: Secondary | ICD-10-CM

## 2019-12-29 DIAGNOSIS — Z8719 Personal history of other diseases of the digestive system: Secondary | ICD-10-CM

## 2019-12-29 DIAGNOSIS — E669 Obesity, unspecified: Secondary | ICD-10-CM | POA: Diagnosis present

## 2019-12-29 DIAGNOSIS — Z9981 Dependence on supplemental oxygen: Secondary | ICD-10-CM

## 2019-12-29 DIAGNOSIS — Z825 Family history of asthma and other chronic lower respiratory diseases: Secondary | ICD-10-CM

## 2019-12-29 DIAGNOSIS — Z882 Allergy status to sulfonamides status: Secondary | ICD-10-CM

## 2019-12-29 DIAGNOSIS — Z20822 Contact with and (suspected) exposure to covid-19: Secondary | ICD-10-CM | POA: Diagnosis present

## 2019-12-29 DIAGNOSIS — Z8249 Family history of ischemic heart disease and other diseases of the circulatory system: Secondary | ICD-10-CM

## 2019-12-29 DIAGNOSIS — Z833 Family history of diabetes mellitus: Secondary | ICD-10-CM

## 2019-12-29 DIAGNOSIS — E7800 Pure hypercholesterolemia, unspecified: Secondary | ICD-10-CM | POA: Diagnosis present

## 2019-12-29 DIAGNOSIS — G4730 Sleep apnea, unspecified: Secondary | ICD-10-CM | POA: Diagnosis present

## 2019-12-29 DIAGNOSIS — Z90710 Acquired absence of both cervix and uterus: Secondary | ICD-10-CM

## 2019-12-29 DIAGNOSIS — J9621 Acute and chronic respiratory failure with hypoxia: Secondary | ICD-10-CM | POA: Diagnosis present

## 2019-12-29 DIAGNOSIS — Z7901 Long term (current) use of anticoagulants: Secondary | ICD-10-CM

## 2019-12-29 DIAGNOSIS — Z6841 Body Mass Index (BMI) 40.0 and over, adult: Secondary | ICD-10-CM

## 2019-12-29 DIAGNOSIS — R600 Localized edema: Secondary | ICD-10-CM

## 2019-12-29 DIAGNOSIS — Z9842 Cataract extraction status, left eye: Secondary | ICD-10-CM

## 2019-12-29 DIAGNOSIS — I440 Atrioventricular block, first degree: Secondary | ICD-10-CM | POA: Diagnosis present

## 2019-12-29 DIAGNOSIS — K5981 Ogilvie syndrome: Secondary | ICD-10-CM | POA: Diagnosis present

## 2019-12-29 DIAGNOSIS — Z79891 Long term (current) use of opiate analgesic: Secondary | ICD-10-CM

## 2019-12-29 DIAGNOSIS — M545 Low back pain, unspecified: Secondary | ICD-10-CM | POA: Diagnosis present

## 2019-12-29 DIAGNOSIS — L89312 Pressure ulcer of right buttock, stage 2: Secondary | ICD-10-CM | POA: Diagnosis present

## 2019-12-29 DIAGNOSIS — Z9049 Acquired absence of other specified parts of digestive tract: Secondary | ICD-10-CM

## 2019-12-29 LAB — BASIC METABOLIC PANEL
Anion gap: 7 (ref 5–15)
BUN: 13 mg/dL (ref 8–23)
CO2: 29 mmol/L (ref 22–32)
Calcium: 8.8 mg/dL — ABNORMAL LOW (ref 8.9–10.3)
Chloride: 104 mmol/L (ref 98–111)
Creatinine, Ser: 1.35 mg/dL — ABNORMAL HIGH (ref 0.44–1.00)
GFR, Estimated: 41 mL/min — ABNORMAL LOW (ref >60)
Glucose, Bld: 111 mg/dL — ABNORMAL HIGH (ref 70–99)
Potassium: 4.1 mmol/L (ref 3.5–5.1)
Sodium: 140 mmol/L (ref 135–145)

## 2019-12-29 LAB — URINALYSIS, ROUTINE W REFLEX MICROSCOPIC
Bilirubin Urine: NEGATIVE
Glucose, UA: NEGATIVE mg/dL
Hgb urine dipstick: NEGATIVE
Ketones, ur: NEGATIVE mg/dL
Leukocytes,Ua: NEGATIVE
Nitrite: NEGATIVE
Protein, ur: NEGATIVE mg/dL
Specific Gravity, Urine: 1.019 (ref 1.005–1.030)
pH: 5 (ref 5.0–8.0)

## 2019-12-29 LAB — CBC
HCT: 39.5 % (ref 36.0–46.0)
Hemoglobin: 12.2 g/dL (ref 12.0–15.0)
MCH: 31.2 pg (ref 26.0–34.0)
MCHC: 30.9 g/dL (ref 30.0–36.0)
MCV: 101 fL — ABNORMAL HIGH (ref 80.0–100.0)
Platelets: 248 10*3/uL (ref 150–400)
RBC: 3.91 MIL/uL (ref 3.87–5.11)
RDW: 15.2 % (ref 11.5–15.5)
WBC: 10.8 10*3/uL — ABNORMAL HIGH (ref 4.0–10.5)
nRBC: 0 % (ref 0.0–0.2)

## 2019-12-29 LAB — RESPIRATORY PANEL BY RT PCR (FLU A&B, COVID)
Influenza A by PCR: NEGATIVE
Influenza B by PCR: NEGATIVE
SARS Coronavirus 2 by RT PCR: NEGATIVE

## 2019-12-29 LAB — TROPONIN I (HIGH SENSITIVITY)
Troponin I (High Sensitivity): 8 ng/L (ref <18)
Troponin I (High Sensitivity): 6 ng/L (ref <18)

## 2019-12-29 LAB — BRAIN NATRIURETIC PEPTIDE: B Natriuretic Peptide: 72 pg/mL (ref 0.0–100.0)

## 2019-12-29 LAB — D-DIMER, QUANTITATIVE: D-Dimer, Quant: 0.69 ug/mL-FEU — ABNORMAL HIGH (ref 0.00–0.50)

## 2019-12-29 MED ORDER — FUROSEMIDE 10 MG/ML IJ SOLN
40.0000 mg | Freq: Once | INTRAMUSCULAR | Status: AC
  Administered 2019-12-29: 40 mg via INTRAVENOUS
  Filled 2019-12-29: qty 4

## 2019-12-29 MED ORDER — SODIUM CHLORIDE 0.9% FLUSH
3.0000 mL | INTRAVENOUS | Status: DC | PRN
  Administered 2019-12-29: 3 mL via INTRAVENOUS

## 2019-12-29 MED ORDER — ATORVASTATIN CALCIUM 80 MG PO TABS
80.0000 mg | ORAL_TABLET | Freq: Every day | ORAL | Status: DC
  Administered 2019-12-30 – 2020-01-01 (×3): 80 mg via ORAL
  Filled 2019-12-29 (×3): qty 1

## 2019-12-29 MED ORDER — ACETAMINOPHEN 325 MG PO TABS
650.0000 mg | ORAL_TABLET | ORAL | Status: DC | PRN
  Administered 2019-12-30: 650 mg via ORAL
  Filled 2019-12-29 (×2): qty 2

## 2019-12-29 MED ORDER — ALBUTEROL SULFATE HFA 108 (90 BASE) MCG/ACT IN AERS
2.0000 | INHALATION_SPRAY | RESPIRATORY_TRACT | Status: DC | PRN
  Filled 2019-12-29: qty 6.7

## 2019-12-29 MED ORDER — NITROGLYCERIN 0.4 MG SL SUBL
0.4000 mg | SUBLINGUAL_TABLET | SUBLINGUAL | Status: DC | PRN
  Administered 2019-12-29: 0.4 mg via SUBLINGUAL
  Filled 2019-12-29: qty 1

## 2019-12-29 MED ORDER — ONDANSETRON HCL 4 MG/2ML IJ SOLN: 4.0000 mg | Freq: Four times a day (QID) | INTRAMUSCULAR | Status: DC | PRN

## 2019-12-29 MED ORDER — ALPRAZOLAM 0.5 MG PO TABS
1.0000 mg | ORAL_TABLET | Freq: Two times a day (BID) | ORAL | Status: DC | PRN
  Administered 2019-12-31 – 2020-01-01 (×2): 1 mg via ORAL
  Filled 2019-12-29 (×2): qty 2

## 2019-12-29 MED ORDER — SODIUM CHLORIDE 0.9% FLUSH
3.0000 mL | Freq: Two times a day (BID) | INTRAVENOUS | Status: DC
  Administered 2019-12-30 – 2020-01-02 (×7): 3 mL via INTRAVENOUS

## 2019-12-29 MED ORDER — SODIUM CHLORIDE 0.9 % IV SOLN: 250.0000 mL | INTRAVENOUS | Status: DC | PRN

## 2019-12-29 MED ORDER — FUROSEMIDE 10 MG/ML IJ SOLN
40.0000 mg | Freq: Every day | INTRAMUSCULAR | Status: DC
  Administered 2019-12-30: 40 mg via INTRAVENOUS
  Filled 2019-12-29: qty 4

## 2019-12-29 MED ORDER — ASPIRIN 81 MG PO CHEW
324.0000 mg | CHEWABLE_TABLET | Freq: Once | ORAL | Status: AC
  Administered 2019-12-29: 324 mg via ORAL
  Filled 2019-12-29: qty 4

## 2019-12-29 MED ORDER — ENOXAPARIN SODIUM 40 MG/0.4ML ~~LOC~~ SOLN: 40.0000 mg | SUBCUTANEOUS | Status: DC

## 2019-12-29 MED ORDER — APIXABAN 5 MG PO TABS
5.0000 mg | ORAL_TABLET | Freq: Two times a day (BID) | ORAL | Status: DC
  Administered 2019-12-29 – 2020-01-02 (×8): 5 mg via ORAL
  Filled 2019-12-29 (×8): qty 1

## 2019-12-29 MED ORDER — FUROSEMIDE 10 MG/ML IJ SOLN: 40.0000 mg | Freq: Once | INTRAMUSCULAR | Status: DC

## 2019-12-29 MED ORDER — METOPROLOL SUCCINATE ER 25 MG PO TB24
25.0000 mg | ORAL_TABLET | Freq: Every day | ORAL | Status: DC
  Administered 2019-12-30 – 2020-01-02 (×4): 25 mg via ORAL
  Filled 2019-12-29 (×4): qty 1

## 2019-12-29 NOTE — H&P (Signed)
History and Physical    Holly Flores OFB:[id number] DOB: September 29, 1945 DOA: 12/29/2019  PCP: Wannetta Sender, FNP  Patient coming from: Home  I have personally briefly reviewed patient's old medical records in [place]  Chief Complaint: SOB, CP  HPI: Holly Flores is a 74 y.o. female with medical history significant of stroke with residual R sided paralysis, dCHF, CKD3a, HTN, O2 at night, A.Flutter on eliquis.  Pt has been having new onset, progressively worsening peripheral edema in BLE, abd, and RUE for the past couple of weeks.  Yesterday pt began to have SOB and CP, pressure like sensation.  Located centrally.  Symptoms worsened today prompting her to come in to the ED.  Symptoms are occurring at rest.  No fever, no abd pain, no back pain, no vomiting, no palpitations.   ED Course: BNP 72, trop neg x2.  Creat 1.35 (baseline)  EDP didn't do UA nor LFTs yet.  CXR neg other than cardiomegally.   Review of Systems: As per HPI, otherwise all review of systems negative.  Past Medical History:  Diagnosis Date  . Anxiety   . Arthritis    "all over" (12/20/2017)  . Central retinal vein occlusion of left eye 01/22/2015  . CHF (congestive heart failure) ([place])   . Chronic bronchitis ([place])    "get it most years" (12/20/2017)  . Chronic lower back pain   . CKD (chronic kidney disease), stage II   . COPD (chronic obstructive pulmonary disease) ([place])   . CVA (cerebral vascular accident) ([place]) 12/30/2014   OCULAR  LEFT  EYE   . Depression   . Fibromyalgia   . GERD (gastroesophageal reflux disease)   . Goiter   . History of colonic polyps 05/14/2014  . Hyperlipidemia   . Hypertension   . Hypothyroidism   . Kidney cysts    left side  . 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 Migraine    "couple times/wk; been having them for a long time" (12/20/2017)  . Myocardial infarction [facility])    "one dr said I did; one  said I didn't;  don't remember when it was" (12/20/2017)  . 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 On home oxygen therapy    "3L; sleep w/it q night" (12/20/2017)  . Pneumonia    "several times" (12/20/2017)  . Shortness of breath   . Sleep apnea    "stopped mask when I got on the oxygen" (12/20/2017)  . Stroke [facility])     Past Surgical History:  Procedure Laterality Date  . ANTERIOR CERVICAL DECOMP/DISCECTOMY FUSION    . BACK SURGERY    . BREAST BIOPSY     left-non cancerous  . CARDIAC CATHETERIZATION    . CARPAL TUNNEL RELEASE Left   . CATARACT EXTRACTION W/PHACO  09/21/2011   Procedure: CATARACT EXTRACTION PHACO AND INTRAOCULAR LENS PLACEMENT (IOC);  Surgeon: Williams Che, MD;  Location: [facility];  Service: Ophthalmology;  Laterality: Left;  CDE:9.78  . CATARACT EXTRACTION W/PHACO Right 10/15/2014   Procedure: CATARACT EXTRACTION PHACO AND INTRAOCULAR LENS PLACEMENT ([place]);  Surgeon: Williams Che, MD;  Location: [facility];  Service: Ophthalmology;  Laterality: Right;  CDE:4.19  . [place]; 1972  . CHOLECYSTECTOMY OPEN    . COLONOSCOPY  2006   RMR: 1. Internal hemorroids, otherwise normal rectum. 2. Pedunculated polyp at 35 cm. reomved with snare. The remainder of teh colonic mucosa appeared normal.   .  COLONOSCOPY  2011   Dr. Gala Romney: multiple ascending colon polyps and rectal polyp, adenomatous  . COLONOSCOPY N/A 05/31/2014   Procedure: COLONOSCOPY;  Surgeon: Daneil Dolin, MD;  Location: [facility];  Service: Endoscopy;  Laterality: N/A;  130  . COLONOSCOPY WITH PROPOFOL N/A 06/24/2017   Procedure: COLONOSCOPY WITH PROPOFOL;  Surgeon: Daneil Dolin, MD;  [facility];  Service: Endoscopy;  Laterality: N/A;  2:15pm  . ESOPHAGOGASTRODUODENOSCOPY  2006   Dr. Gala Romney: Subtle Schatzkis ring, otherwise normal upper GI tract, aside from a small pyloric channell erosion, status post dilation as described above.   Marland Kitchen FLEXIBLE SIGMOIDOSCOPY N/A 11/11/2017   Procedure:  FLEXIBLE SIGMOIDOSCOPY;  Surgeon: Otis Brace, MD;  Location: [place];  Service: Gastroenterology;  Laterality: N/A;  . JOINT REPLACEMENT    . LUMBAR DISC SURGERY  X 2  . POLYPECTOMY  06/24/2017   Procedure: POLYPECTOMY;  Surgeon: Daneil Dolin, MD;  [facility];  Service: Endoscopy;;  ascending  . POSTERIOR LUMBAR FUSION  ? date 1st fusion ; 07/2006   previous L4-5 Ray threaded fusion cage; Exploration of L4-5 fusion & PLIF 07/22/2006/notes 07/22/2006  . TONSILLECTOMY    . TOTAL KNEE ARTHROPLASTY Right 05/24/2013   Procedure: TOTAL KNEE ARTHROPLASTY;  Surgeon: [name], MD;  Location: [place];  Service: Orthopedics;  Laterality: Right;  . TOTAL KNEE ARTHROPLASTY Left   . VAGINAL HYSTERECTOMY       reports that she quit smoking about 32 years ago. Her smoking use included cigarettes. She has a 5.00 pack-year smoking history. She has never used smokeless tobacco. She reports previous alcohol use. She reports that she does not use drugs.  Allergies  Allergen Reactions  . Penicillins Hives, Itching and Rash    Has patient had a PCN reaction causing immediate rash, facial/tongue/throat swelling, SOB or lightheadedness with hypotension: Yes Has patient had a PCN reaction causing severe rash involving mucus membranes or skin necrosis: Yes Has patient had a PCN reaction that required hospitalization: No Has patient had a PCN reaction occurring within the last 10 years: No If all of the above answers are "NO", then may proceed with Cephalosporin use.   . Sulfa Antibiotics Swelling    Tongue swelling    Family History  Problem Relation Age of Onset  . Cancer Other   . Diabetes Other   . Heart disease Other   . Hypertension Other   . Asthma Other   . Alcoholism Other   . Thyroid disease Other   . Rheumatologic disease Other   . Stroke Other   . Hypercholesterolemia Other   . Colon cancer Neg Hx      Prior to Admission medications   Medication Sig Start Date  End Date Taking? Authorizing Provider  albuterol (PROVENTIL HFA;VENTOLIN HFA) 108 (90 Base) MCG/ACT inhaler Inhale 2 puffs into the lungs every 6 (six) hours as needed (for wheezing/shortness of breath). Patient taking differently: Inhale 2 puffs into the lungs every 4 (four) hours as needed (for wheezing/shortness of breath).  12/08/17   Angiulli, Lavon Paganini, PA-C  ALPRAZolam Duanne Moron) 1 MG tablet Take 1 mg by mouth 2 (two) times daily as needed for anxiety.    [provider]  amLODipine (NORVASC) 10 MG tablet Take 1 tablet (10 mg total) by mouth daily. 10/30/19   Arnoldo Lenis, MD  apixaban (ELIQUIS) 5 MG TABS tablet Take 1 tablet (5 mg total) by mouth 2 (two) times daily. 10/30/19  Arnoldo Lenis, MD  atorvastatin (LIPITOR) 80 MG tablet Take 1 tablet (80 mg total) by mouth daily. 10/30/19 01/28/20  Arnoldo Lenis, MD  cyclobenzaprine (FLEXERIL) 5 MG tablet Take 1 tablet (5 mg total) by mouth at bedtime. 12/08/17   Angiulli, Lavon Paganini, PA-C  hydroxypropyl methylcellulose / hypromellose (ISOPTO TEARS / GONIOVISC) 2.5 % ophthalmic solution Place 1 drop into both eyes as needed for dry eyes.    [provider]  metoprolol succinate (TOPROL-XL) 25 MG 24 hr tablet Take 1 tablet (25 mg total) by mouth daily. 10/30/19   Arnoldo Lenis, MD  naproxen (NAPROSYN) 500 MG tablet Take 500 mg by mouth 2 (two) times daily as needed for mild pain or moderate pain (with food/milk).    [provider]  potassium chloride SA (K-DUR,KLOR-CON) 20 MEQ tablet Take 1 tablet (20 mEq total) by mouth daily. 12/08/17   Angiulli, Lavon Paganini, PA-C  torsemide (DEMADEX) 20 MG tablet Take 1 tablet (20 mg total) by mouth daily. 10/30/19   Arnoldo Lenis, MD    Physical Exam: Vitals:   12/29/19 1742 12/29/19 1853 12/29/19 2000 12/29/19 2100  BP: 140/83 (!) 143/84 127/90 124/74  Pulse: 74 80 85 89  Resp: (!) 22 19 (!) 24 (!) 24  Temp:      TempSrc:      SpO2: 96% 98% 96% 99%  Height:          Constitutional: NAD, calm, comfortable Eyes: PERRL, lids and conjunctivae normal ENMT: Mucous membranes are moist. Posterior pharynx clear of any exudate or lesions.Normal dentition.  Neck: normal, supple, no masses, no thyromegaly Respiratory: clear to auscultation bilaterally, no wheezing, no crackles. Normal respiratory effort. No accessory muscle use.  Cardiovascular: Regular rate and rhythm, no murmurs / rubs / gallops. Anasarca with 2+ BLE pitting edema, abd wall edema, and 3+ RUE edema 2+ pedal pulses. No carotid bruits.  Abdomen: no tenderness, no masses palpated. No hepatosplenomegaly. Bowel sounds positive.  Musculoskeletal: no clubbing / cyanosis. No joint deformity upper and lower extremities. Good ROM, no contractures. Normal muscle tone.  Skin: no rashes, lesions, ulcers. No induration Neurologic: CN 2-12 grossly intact. Sensation intact, DTR normal. Strength 5/5 in all 4.  Psychiatric: Normal judgment and insight. Alert and oriented x 3. Normal mood.    Labs on Admission: I have personally reviewed following labs and imaging studies  CBC: Recent Labs  Lab 12/29/19 1524  WBC 10.8*  HGB 12.2  HCT 39.5  MCV 101.0*  PLT 706   Basic Metabolic Panel: Recent Labs  Lab 12/29/19 1524  NA 140  K 4.1  CL 104  CO2 29  GLUCOSE 111*  BUN 13  CREATININE 1.35*  CALCIUM 8.8*   GFR: CrCl cannot be calculated (Unknown ideal weight.). Liver Function Tests: No results for input(s): AST, ALT, ALKPHOS, BILITOT, PROT, ALBUMIN in the last 168 hours. No results for input(s): LIPASE, AMYLASE in the last 168 hours. No results for input(s): AMMONIA in the last 168 hours. Coagulation Profile: No results for input(s): INR, PROTIME in the last 168 hours. Cardiac Enzymes: No results for input(s): CKTOTAL, CKMB, CKMBINDEX, TROPONINI in the last 168 hours. BNP (last 3 results) No results for input(s): PROBNP in the last 8760 hours. HbA1C: No results for input(s): HGBA1C in the  last 72 hours. CBG: No results for input(s): GLUCAP in the last 168 hours. Lipid Profile: No results for input(s): CHOL, HDL, LDLCALC, TRIG, CHOLHDL, LDLDIRECT in the last 72 hours. Thyroid Function  Tests: No results for input(s): TSH, T4TOTAL, FREET4, T3FREE, THYROIDAB in the last 72 hours. Anemia Panel: No results for input(s): VITAMINB12, FOLATE, FERRITIN, TIBC, IRON, RETICCTPCT in the last 72 hours. Urine analysis:    Component Value Date/Time   COLORURINE STRAW (A) 10/01/2018 2111   APPEARANCEUR CLEAR 10/01/2018 2111   LABSPEC 1.019 10/01/2018 2111   PHURINE 7.0 10/01/2018 2111   GLUCOSEU NEGATIVE 10/01/2018 2111   HGBUR NEGATIVE 10/01/2018 2111   BILIRUBINUR NEGATIVE 10/01/2018 2111   KETONESUR NEGATIVE 10/01/2018 2111   PROTEINUR NEGATIVE 10/01/2018 2111   UROBILINOGEN 0.2 12/29/2014 2314   NITRITE NEGATIVE 10/01/2018 2111   LEUKOCYTESUR NEGATIVE 10/01/2018 2111    Radiological Exams on Admission: DG Chest 2 View  Result Date: 12/29/2019 CLINICAL DATA:  Chest pain short of breath EXAM: CHEST - 2 VIEW COMPARISON:  12/19/2017, CT 05/20/2017 FINDINGS: Surgical hardware in the cervical spine. Cardiomegaly without edema, focal opacity or pleural effusion. No pneumothorax. IMPRESSION: No active cardiopulmonary disease. Cardiomegaly. Electronically Signed   By: Donavan Foil M.D.   On: 12/29/2019 15:54    EKG: Independently reviewed. Sinus rhythm with 1st degree AV block.  Assessment/Plan Principal Problem:   Anasarca Active Problems:   Essential hypertension   Chronic kidney disease (CKD), stage III (moderate) (HCC)    1. Anasarca - 1. Suspect this likely cause of pt CP and SOB. 2. DDx includes (but not limited to) acute on chronic dCHF, hepatogenic and nephrogenic 3. Empiric lasix 40mg  IV now and then daily to see if this helps 4. LFT, check albumin and total protein 5. UA 6. 2d echo 7. CHF pathway 8. Strict intake and output 2. HTN - 1. Cont metoprolol 2. Hold  amlodipine for the moment given degree of edema until we know cause. 3. CKD 3a - creat 1.35 today seems in line with her baseline 4. H/o A.Flutter - 1. Cont eliquis 2. Cont metoprolol  DVT prophylaxis: Cont eliquis Code Status: Full Family Communication: Daughter at bedside Disposition Plan: Home after edema, CP, and SOB improved Consults called: None Admission status: Place in obs     Adama Ferber, [place] Hospitalists  How to contact the [facility] Attending or Consulting provider [place] or covering provider during after hours [place], for this patient?  1. Check the care team in [facility] and look for a) attending/consulting [facility] provider listed and b) the [facility] team listed 2. Log into [email or web address]  Amion Physician Scheduling and messaging for groups and whole hospitals  On call and physician scheduling software for group practices, residents, hospitalists and other medical providers for call, clinic, rotation and shift schedules. On[employer] is a hospital-wide system for scheduling doctors and paging doctors on call. EasyPlot is for scientific plotting and data analysis.  [email or web address]  and use [place]'s universal password to access. If you do not have the password, please contact the hospital operator.  3. Locate the [facility] provider you are looking for under [facility] and page to a number that you can be directly reached. 4. If you still have difficulty reaching the provider, please page the [facility] (Director on Call) for the Hospitalists listed on amion for assistance.  12/29/2019, 10:20 PM

## 2019-12-29 NOTE — ED Provider Notes (Signed)
[facility] EMERGENCY DEPARTMENT Provider Note   CSN: [id number] Arrival date & time: 12/29/19  1512     History Chief Complaint  Patient presents with   Shortness of Breath    Holly Flores is a 74 y.o. female.  The history is provided by the patient.  Chest Pain Pain location:  Substernal area Pain quality: pressure   Pain radiates to:  Does not radiate Pain severity:  Severe Onset quality:  Gradual Duration:  1 day Timing:  Constant Progression:  Worsening Chronicity:  New Relieved by:  Rest Worsened by:  Exertion Associated symptoms: cough and shortness of breath   Associated symptoms: no abdominal pain, no back pain, no fever, no palpitations and no vomiting     HPI: A 74 year old patient with a history of CVA, hypertension, hypercholesterolemia and obesity presents for evaluation of chest pain. Initial onset of pain was more than 6 hours ago. The patient's chest pain is described as heaviness/pressure/tightness and is worse with exertion. The patient's chest pain is middle- or left-sided, is not well-localized, is not sharp and does not radiate to the arms/jaw/neck. The patient does not complain of nausea and denies diaphoresis. The patient has no history of peripheral artery disease, has not smoked in the past 90 days, denies any history of treated diabetes and has no relevant family history of coronary artery disease (first degree relative at less than age 37).   Past Medical History:  Diagnosis Date   Anxiety    Arthritis    "all over" (12/20/2017)   Central retinal vein occlusion of left eye 01/22/2015   CHF (congestive heart failure) (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   "affected my right side"   Migraine    "couple times/wk; been having them for a long time" (12/20/2017)   Myocardial infarction [facility])    "one dr said I did; one said I didn't;  don't remember when it was" (12/20/2017)   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   On home oxygen therapy    "3L; sleep w/it q night" (12/20/2017)   Pneumonia    "several times" (12/20/2017)   Shortness of breath    Sleep apnea    "stopped mask when I got on the oxygen" (12/20/2017)   Stroke [facility])     Patient Active Problem List   Diagnosis Date Noted   Anasarca 12/29/2019   Acute CVA (cerebrovascular accident) ([place]) 10/01/2018   Physical deconditioning 12/19/2017   Chronic kidney disease (CKD), stage III (moderate) (HCC)    Hypoalbuminemia due to protein-calorie malnutrition (HCC)    Intractable migraine without status migrainosus    Reactive depression    Hemiparesis affecting right side as late effect of stroke (HCC)    Slow transit constipation    PAF (paroxysmal atrial fibrillation) ([place])    Dysphasia, post-stroke    COPD (chronic obstructive pulmonary disease) ([place]) 10/29/2017  Chronic diastolic CHF (congestive heart failure) ([place]) 10/29/2017   Hypothyroidism 10/29/2017   Essential hypertension 10/29/2017   GERD (gastroesophageal reflux disease) 10/29/2017   Anxiety 10/29/2017   DJD (degenerative joint disease) of knee 05/24/2013   Hyperlipidemia 04/20/2013    Past Surgical History:  Procedure Laterality Date   ANTERIOR CERVICAL DECOMP/DISCECTOMY FUSION     BACK SURGERY     BREAST BIOPSY     left-non cancerous   CARDIAC CATHETERIZATION     CARPAL TUNNEL RELEASE Left    CATARACT EXTRACTION W/PHACO  09/21/2011   Procedure: CATARACT  EXTRACTION PHACO AND INTRAOCULAR LENS PLACEMENT ([place]);  Surgeon: Williams Che, MD;  Location: [facility];  Service: Ophthalmology;  Laterality: Left;  CDE:9.78   CATARACT EXTRACTION W/PHACO Right 10/15/2014   Procedure: CATARACT EXTRACTION PHACO AND INTRAOCULAR LENS PLACEMENT ([facility]);  Surgeon: Williams Che, MD;  Location: [facility];  Service: Ophthalmology;  Laterality: Right;  CDE:4.19   [place]; 1972   CHOLECYSTECTOMY OPEN     COLONOSCOPY  2006   RMR: 1. Internal hemorroids, otherwise normal rectum. 2. Pedunculated polyp at 35 cm. reomved with snare. The remainder of teh colonic mucosa appeared normal.    COLONOSCOPY  2011   Dr. Gala Romney: multiple ascending colon polyps and rectal polyp, adenomatous   COLONOSCOPY N/A 05/31/2014   Procedure: COLONOSCOPY;  Surgeon: Daneil Dolin, MD;  Location: [facility];  Service: Endoscopy;  Laterality: N/A;  130   COLONOSCOPY WITH PROPOFOL N/A 06/24/2017   Procedure: COLONOSCOPY WITH PROPOFOL;  Surgeon: Daneil Dolin, MD;  Location: [facility];  Service: Endoscopy;  Laterality: N/A;  2:15pm   ESOPHAGOGASTRODUODENOSCOPY  2006   Dr. Gala Romney: Subtle Schatzkis ring, otherwise normal upper GI tract, aside from a small pyloric channell erosion, status post dilation as described above.    FLEXIBLE SIGMOIDOSCOPY N/A 11/11/2017   Procedure: FLEXIBLE SIGMOIDOSCOPY;  Surgeon: Otis Brace, MD;  Location: [place];  Service: Gastroenterology;  Laterality: N/A;   JOINT REPLACEMENT     LUMBAR DISC SURGERY  X 2   POLYPECTOMY  06/24/2017   Procedure: POLYPECTOMY;  Surgeon: Daneil Dolin, MD;  Location: [facility];  Service: Endoscopy;;  ascending   POSTERIOR LUMBAR FUSION  ? date 1st fusion ; 07/2006   previous L4-5 Ray threaded fusion cage; Exploration of L4-5 fusion & PLIF 07/22/2006/notes 07/22/2006   TONSILLECTOMY     TOTAL KNEE ARTHROPLASTY Right 05/24/2013   Procedure: TOTAL KNEE ARTHROPLASTY;  Surgeon: [name], MD;   Location: [place];  Service: Orthopedics;  Laterality: Right;   TOTAL KNEE ARTHROPLASTY Left    VAGINAL HYSTERECTOMY       OB History   No obstetric history on file.     Family History  Problem Relation Age of Onset   Cancer Other    Diabetes Other    Heart disease Other    Hypertension Other    Asthma Other    Alcoholism Other    Thyroid disease Other    Rheumatologic disease Other    Stroke Other    Hypercholesterolemia Other    Colon cancer Neg Hx     Social History   Tobacco Use   Smoking status: Former Smoker    Packs/day: 0.50    Years: 10.00    Pack years: 5.00    Types: Cigarettes    Quit date: 04/21/1987    Years since quitting: 32.7   Smokeless tobacco: Never Used   Tobacco comment: smokes  cigarette if she has a headache which helps.  Vaping Use   Vaping Use: Never used  Substance Use Topics   Alcohol use: Not Currently    Comment: previously 2 glasses of wine a day; stopped in 2017 (12/20/2017   Drug use: Never    Home Medications Prior to Admission medications   Medication Sig Start Date End Date Taking? Authorizing Provider  albuterol (PROVENTIL HFA;VENTOLIN HFA) 108 (90 Base) MCG/ACT inhaler Inhale 2 puffs into the lungs every 6 (six) hours as needed (for wheezing/shortness of breath). Patient taking differently: Inhale 2 puffs into the lungs every 4 (four) hours as needed (for wheezing/shortness of breath).  12/08/17   Angiulli, Lavon Paganini, PA-C  ALPRAZolam Duanne Moron) 1 MG tablet Take 1 mg by mouth 2 (two) times daily as needed for anxiety.    [provider]  amLODipine (NORVASC) 10 MG tablet Take 1 tablet (10 mg total) by mouth daily. 10/30/19   Arnoldo Lenis, MD  apixaban (ELIQUIS) 5 MG TABS tablet Take 1 tablet (5 mg total) by mouth 2 (two) times daily. 10/30/19   Arnoldo Lenis, MD  atorvastatin (LIPITOR) 80 MG tablet Take 1 tablet (80 mg total) by mouth daily. 10/30/19 01/28/20  Arnoldo Lenis, MD  cyclobenzaprine  (FLEXERIL) 5 MG tablet Take 1 tablet (5 mg total) by mouth at bedtime. 12/08/17   Angiulli, Lavon Paganini, PA-C  hydroxypropyl methylcellulose / hypromellose (ISOPTO TEARS / GONIOVISC) 2.5 % ophthalmic solution Place 1 drop into both eyes as needed for dry eyes.    [provider]  metoprolol succinate (TOPROL-XL) 25 MG 24 hr tablet Take 1 tablet (25 mg total) by mouth daily. 10/30/19   Arnoldo Lenis, MD  naproxen (NAPROSYN) 500 MG tablet Take 500 mg by mouth 2 (two) times daily as needed for mild pain or moderate pain (with food/milk).    [provider]  potassium chloride SA (K-DUR,KLOR-CON) 20 MEQ tablet Take 1 tablet (20 mEq total) by mouth daily. 12/08/17   Angiulli, Lavon Paganini, PA-C  torsemide (DEMADEX) 20 MG tablet Take 1 tablet (20 mg total) by mouth daily. 10/30/19   Arnoldo Lenis, MD    Allergies    Penicillins and Sulfa antibiotics  Review of Systems   Review of Systems  Constitutional: Negative for chills and fever.  HENT: Negative for ear pain and sore throat.   Eyes: Negative for pain and visual disturbance.  Respiratory: Positive for cough and shortness of breath.   Cardiovascular: Positive for chest pain and leg swelling. Negative for palpitations.  Gastrointestinal: Negative for abdominal pain and vomiting.  Genitourinary: Negative for dysuria and hematuria.  Musculoskeletal: Negative for arthralgias and back pain.  Skin: Negative for color change and rash.  Neurological: Negative for seizures and syncope.  All other systems reviewed and are negative.   Physical Exam Updated Vital Signs BP (!) 127/106    Pulse 90    Temp 98 F (36.7 C) (Oral)    Resp 16    Ht 5\' 1"  (1.549 m)    SpO2 95%    BMI 34.96 kg/m   Physical Exam Vitals and nursing note reviewed.  Constitutional:      Appearance: She is obese. She is not ill-appearing, toxic-appearing or diaphoretic.  HENT:     Head: Normocephalic and atraumatic.  Eyes:     Conjunctiva/sclera:  Conjunctivae normal.  Cardiovascular:     Rate and Rhythm: Normal rate and regular rhythm.     Pulses:  Radial pulses are 2+ on the right side and 2+ on the left side.     Heart sounds: No murmur heard.  No gallop.   Pulmonary:     Effort: Pulmonary effort is normal. No respiratory distress.     Breath sounds: Normal breath sounds. No wheezing or rhonchi.  Abdominal:     Palpations: Abdomen is soft.     Tenderness: There is no abdominal tenderness.     Comments: Some abd wall edema present  Musculoskeletal:     Cervical back: Neck supple.     Right lower leg: 3+ Pitting Edema present.     Left lower leg: 2+ Pitting Edema present.     Comments: RUE with edema  Skin:    General: Skin is warm and dry.  Neurological:     Mental Status: She is alert. Mental status is at baseline.     ED Results / Procedures / Treatments   Labs (all labs ordered are listed, but only abnormal results are displayed) Labs Reviewed  BASIC METABOLIC PANEL - Abnormal; Notable for the following components:      Result Value   Glucose, Bld 111 (*)    Creatinine, Ser 1.35 (*)    Calcium 8.8 (*)    GFR, Estimated 41 (*)    All other components within normal limits  CBC - Abnormal; Notable for the following components:   WBC 10.8 (*)    MCV 101.0 (*)    All other components within normal limits  D-DIMER, QUANTITATIVE (NOT AT [facility]) - Abnormal; Notable for the following components:   D-Dimer, Quant 0.69 (*)    All other components within normal limits  HEPATIC FUNCTION PANEL - Abnormal; Notable for the following components:   Albumin 3.4 (*)    Indirect Bilirubin 0.1 (*)    All other components within normal limits  RESPIRATORY PANEL BY RT PCR (FLU A&B, COVID)  BRAIN NATRIURETIC PEPTIDE  URINALYSIS, ROUTINE W REFLEX MICROSCOPIC  BASIC METABOLIC PANEL  TROPONIN I (HIGH SENSITIVITY)  TROPONIN I (HIGH SENSITIVITY)    EKG EKG Interpretation  Date/Time:  Friday December 29 2019 19:27:51  EST Ventricular Rate:  80 PR Interval:  212 QRS Duration: 92 QT Interval:  370 QTC Calculation: 427 R Axis:   -24 Text Interpretation: Sinus rhythm Prolonged PR interval Borderline left axis deviation Nonspecific T abnormalities, lateral leads No significant change since last tracing Confirmed by Gareth Morgan [phone]) on 12/29/2019 8:03:31 PM   Radiology DG Chest 2 View  Result Date: 12/29/2019 CLINICAL DATA:  Chest pain short of breath EXAM: CHEST - 2 VIEW COMPARISON:  12/19/2017, CT 05/20/2017 FINDINGS: Surgical hardware in the cervical spine. Cardiomegaly without edema, focal opacity or pleural effusion. No pneumothorax. IMPRESSION: No active cardiopulmonary disease. Cardiomegaly. Electronically Signed   By: Donavan Foil M.D.   On: 12/29/2019 15:54    Procedures Procedures (including critical care time)  Medications Ordered in ED Medications  nitroGLYCERIN (NITROSTAT) SL tablet 0.4 mg (0.4 mg Sublingual Given 12/29/19 2059)  sodium chloride flush (NS) 0.9 % injection 3 mL (3 mLs Intravenous Not Given 12/30/19 0003)  sodium chloride flush (NS) 0.9 % injection 3 mL (3 mLs Intravenous Given 12/29/19 2359)  0.9 %  sodium chloride infusion (has no administration in time range)  acetaminophen (TYLENOL) tablet 650 mg (has no administration in time range)  ondansetron (ZOFRAN) injection 4 mg (has no administration in time range)  furosemide (LASIX) injection 40 mg (has no administration in time range)  apixaban (  ELIQUIS) tablet 5 mg (5 mg Oral Given 12/29/19 2359)  atorvastatin (LIPITOR) tablet 80 mg (has no administration in time range)  ALPRAZolam (XANAX) tablet 1 mg (has no administration in time range)  albuterol (VENTOLIN HFA) 108 (90 Base) MCG/ACT inhaler 2 puff (has no administration in time range)  metoprolol succinate (TOPROL-XL) 24 hr tablet 25 mg (has no administration in time range)  aspirin chewable tablet 324 mg (324 mg Oral Given 12/29/19 2101)  furosemide (LASIX)  injection 40 mg (40 mg Intravenous Given 12/29/19 2359)    ED Course  I have reviewed the triage vital signs and the nursing notes.  Pertinent labs & imaging results that were available during my care of the patient were reviewed by me and considered in my medical decision making (see chart for details).    MDM Rules/Calculators/A&P HEAR Score: 6                        The patient is a 74yo female, PMH HFpEF, HTN, HLD, prior CVA with R sided paralysis, who presents to the ED for chest pain and SOB.  On my initial evaluation, the patient is hemodynamically stable, afebrile, nontoxic-appearing. Physical exam remarkable for diffuse edema, worse on R side.  Differentials considered include CHF exacerbation, ACS, PE, PTX, pneumonia, COVID19. I am most concerned for fluid overload, possibly from CHF exacerbation or worsening dependent edema from bedbound status. EKG with normal sinus rhythm, some TWI in leads I and aVL but ultimately unchanged from prior.   Labs and CXR remarkable for normal troponin and BNP with no pulmonary edema. Renal function at baseline, very mild leukocytosis and doubt infectious cause. D-dimer negative by age adjusted criteria, low suspicion for PE.  Provided IV lasix and consulted hospitalist for admission for further evaluation and management of diffuse edema. Hospitalist accepted to their service. No further acute events while under my care.   The care of this patient was overseen by Dr. Billy Fischer, who agreed with evaluation and plan of care.   Final Clinical Impression(s) / ED Diagnoses Final diagnoses:  Acute on chronic congestive heart failure, unspecified heart failure type [facility])  Extremity edema    Rx / DC Orders ED Discharge Orders    None       Launa Flight, MD 12/30/19 [id number]    Gareth Morgan, MD 12/30/19 1527

## 2019-12-29 NOTE — ED Triage Notes (Signed)
Pt endorses SOB that worsened today with some CP. Pain is central and pressure like. Hx of CVA with right sided paralysis.

## 2019-12-30 ENCOUNTER — Other Ambulatory Visit: Payer: Self-pay

## 2019-12-30 ENCOUNTER — Encounter (HOSPITAL_COMMUNITY): Payer: Self-pay | Admitting: Internal Medicine

## 2019-12-30 ENCOUNTER — Observation Stay (HOSPITAL_COMMUNITY): Payer: Medicare Other

## 2019-12-30 DIAGNOSIS — Z20822 Contact with and (suspected) exposure to covid-19: Secondary | ICD-10-CM | POA: Diagnosis present

## 2019-12-30 DIAGNOSIS — M545 Low back pain, unspecified: Secondary | ICD-10-CM | POA: Diagnosis present

## 2019-12-30 DIAGNOSIS — I5033 Acute on chronic diastolic (congestive) heart failure: Secondary | ICD-10-CM

## 2019-12-30 DIAGNOSIS — Z7901 Long term (current) use of anticoagulants: Secondary | ICD-10-CM | POA: Diagnosis not present

## 2019-12-30 DIAGNOSIS — E039 Hypothyroidism, unspecified: Secondary | ICD-10-CM | POA: Diagnosis present

## 2019-12-30 DIAGNOSIS — I10 Essential (primary) hypertension: Secondary | ICD-10-CM

## 2019-12-30 DIAGNOSIS — Z23 Encounter for immunization: Secondary | ICD-10-CM | POA: Diagnosis present

## 2019-12-30 DIAGNOSIS — Z9981 Dependence on supplemental oxygen: Secondary | ICD-10-CM | POA: Diagnosis not present

## 2019-12-30 DIAGNOSIS — I130 Hypertensive heart and chronic kidney disease with heart failure and stage 1 through stage 4 chronic kidney disease, or unspecified chronic kidney disease: Secondary | ICD-10-CM | POA: Diagnosis present

## 2019-12-30 DIAGNOSIS — Z8719 Personal history of other diseases of the digestive system: Secondary | ICD-10-CM | POA: Diagnosis not present

## 2019-12-30 DIAGNOSIS — G4730 Sleep apnea, unspecified: Secondary | ICD-10-CM | POA: Diagnosis present

## 2019-12-30 DIAGNOSIS — N1831 Chronic kidney disease, stage 3a: Secondary | ICD-10-CM | POA: Diagnosis present

## 2019-12-30 DIAGNOSIS — G8929 Other chronic pain: Secondary | ICD-10-CM | POA: Diagnosis present

## 2019-12-30 DIAGNOSIS — I4892 Unspecified atrial flutter: Secondary | ICD-10-CM | POA: Diagnosis present

## 2019-12-30 DIAGNOSIS — R601 Generalized edema: Secondary | ICD-10-CM

## 2019-12-30 DIAGNOSIS — K5981 Ogilvie syndrome: Secondary | ICD-10-CM | POA: Diagnosis present

## 2019-12-30 DIAGNOSIS — M797 Fibromyalgia: Secondary | ICD-10-CM | POA: Diagnosis present

## 2019-12-30 DIAGNOSIS — Z6841 Body Mass Index (BMI) 40.0 and over, adult: Secondary | ICD-10-CM | POA: Diagnosis not present

## 2019-12-30 DIAGNOSIS — E669 Obesity, unspecified: Secondary | ICD-10-CM | POA: Diagnosis present

## 2019-12-30 DIAGNOSIS — J449 Chronic obstructive pulmonary disease, unspecified: Secondary | ICD-10-CM | POA: Diagnosis present

## 2019-12-30 DIAGNOSIS — J9621 Acute and chronic respiratory failure with hypoxia: Secondary | ICD-10-CM | POA: Diagnosis present

## 2019-12-30 DIAGNOSIS — E785 Hyperlipidemia, unspecified: Secondary | ICD-10-CM | POA: Diagnosis present

## 2019-12-30 DIAGNOSIS — I69351 Hemiplegia and hemiparesis following cerebral infarction affecting right dominant side: Secondary | ICD-10-CM | POA: Diagnosis not present

## 2019-12-30 DIAGNOSIS — Z882 Allergy status to sulfonamides status: Secondary | ICD-10-CM | POA: Diagnosis not present

## 2019-12-30 DIAGNOSIS — I509 Heart failure, unspecified: Secondary | ICD-10-CM

## 2019-12-30 DIAGNOSIS — K219 Gastro-esophageal reflux disease without esophagitis: Secondary | ICD-10-CM | POA: Diagnosis present

## 2019-12-30 DIAGNOSIS — Z880 Allergy status to penicillin: Secondary | ICD-10-CM | POA: Diagnosis not present

## 2019-12-30 LAB — BASIC METABOLIC PANEL
Anion gap: 8 (ref 5–15)
BUN: 14 mg/dL (ref 8–23)
CO2: 28 mmol/L (ref 22–32)
Calcium: 8.8 mg/dL — ABNORMAL LOW (ref 8.9–10.3)
Chloride: 104 mmol/L (ref 98–111)
Creatinine, Ser: 1.42 mg/dL — ABNORMAL HIGH (ref 0.44–1.00)
GFR, Estimated: 39 mL/min — ABNORMAL LOW (ref >60)
Glucose, Bld: 97 mg/dL (ref 70–99)
Potassium: 4.6 mmol/L (ref 3.5–5.1)
Sodium: 140 mmol/L (ref 135–145)

## 2019-12-30 LAB — HEPATIC FUNCTION PANEL
ALT: 11 U/L (ref 0–44)
AST: 19 U/L (ref 15–41)
Albumin: 3.4 g/dL — ABNORMAL LOW (ref 3.5–5.0)
Alkaline Phosphatase: 94 U/L (ref 38–126)
Bilirubin, Direct: 0.2 mg/dL (ref 0.0–0.2)
Indirect Bilirubin: 0.1 mg/dL — ABNORMAL LOW (ref 0.3–0.9)
Total Bilirubin: 0.3 mg/dL (ref 0.3–1.2)
Total Protein: 7.5 g/dL (ref 6.5–8.1)

## 2019-12-30 LAB — ECHOCARDIOGRAM COMPLETE
Area-P 1/2: 3.27 cm2
Height: 60.1 in
S' Lateral: 2.45 cm
Weight: 3982.39 oz

## 2019-12-30 MED ORDER — INFLUENZA VAC A&B SA ADJ QUAD 0.5 ML IM PRSY
0.5000 mL | PREFILLED_SYRINGE | INTRAMUSCULAR | Status: AC
  Administered 2020-01-02: 0.5 mL via INTRAMUSCULAR
  Filled 2019-12-30 (×2): qty 0.5

## 2019-12-30 MED ORDER — FUROSEMIDE 10 MG/ML IJ SOLN
40.0000 mg | Freq: Two times a day (BID) | INTRAMUSCULAR | Status: DC
  Administered 2019-12-30 – 2019-12-31 (×2): 40 mg via INTRAVENOUS
  Filled 2019-12-30 (×2): qty 4

## 2019-12-30 NOTE — Progress Notes (Signed)
PROGRESS NOTE    Holly Flores  OMV:[id number] DOB: 04/17/1945 DOA: 12/29/2019 PCP: Wannetta Sender, FNP   Brief Narrative: Holly Flores is a 74 y.o. female with medical history significant of stroke with residual R sided paralysis, dCHF, CKD3a, HTN, O2 at night, A.Flutter on Eliquis. Patient presented secondary to dyspnea and chest pain and found to have anasarca. She was started on IV lasix. Treatment started for heart failure exacerbation.   Assessment & Plan:   Principal Problem:   Anasarca Active Problems:   Essential hypertension   Chronic kidney disease (CKD), stage III (moderate) (HCC)   Acute on chronic diastolic CHF (congestive heart failure) (HCC)   Anasarca Fluid overload likely secondary to heart failure. -Lasix as below  Acute on chronic diastolic heart failure Unknown etiology but possibly dietary indiscretion; patient not upfront about diet habits. States she has been adherent with torsemide, however. BNP of 72 in setting of obesity. Started on Lasix 40 mg IV daily. Patient does not know her dry weight and has not weighed herself for two years. No weight on admission, however, earliest weight of 112.9 kg on 11/13. UOP of 900 mL over the last 24 hours -Increase to Lasix 40 mg IV BID -Daily weights/strict in and out -OT eval  Acute on chronic respiratory failure with hypoxia Secondary to above. Patient reports nocturnal oxygen but not daytime oxygen. Weaned down to 0.5 L of oxygen via [place] -Wean to room air as able  Dyspnea Secondary to above. Improving with diuresis.  Essential hypertension Patient is on metoprolol, amlodipine, torsemide as an outpatient. BP controlled. -Continue metoprolol  CKD stage IIIa Baseline creatinine of 1.3. Stable.    History of atrial flutter -Continue Eliquis and metoprolol  Hyperlipidemia -Continue Lipitor  History of CVA Residual right side hemiparesis. Stable. Patient is non-ambulatory.   DVT  prophylaxis: Eliquis Code Status:   Code Status: Full Code Family Communication: None at bedside Disposition Plan: Discharge likely home in 24-48 hours pending improvement of overall fluid status and transition to oral diuretic therapy   Consultants:   None  Procedures:   TRANSTHORACIC ECHOCARDIOGRAM (12/30/2019) IMPRESSIONS    1. Left ventricular ejection fraction, by estimation, is 65 to 70%. The  left ventricle has normal function. The left ventricle has no regional  wall motion abnormalities. There is moderate left ventricular hypertrophy.  Left ventricular diastolic  parameters are consistent with Grade I diastolic dysfunction (impaired  relaxation).  2. Right ventricular systolic function is normal. The right ventricular  size is normal.  3. The mitral valve is normal in structure. No evidence of mitral valve  regurgitation. No evidence of mitral stenosis.  4. The aortic valve is normal in structure. Aortic valve regurgitation is  not visualized. No aortic stenosis is present.  5. Aortic dilatation noted. There is mild dilatation of the aortic root,  measuring 40 mm.  6. The inferior vena cava is normal in size with greater than 50%  respiratory variability, suggesting right atrial pressure of 3 mmHg.  Antimicrobials:  None    Subjective: Breathing better but still with some shortness of breath. Patient's orthopnea symptoms have improved. She is non-ambulatory and transfers with the aid of a home aide. She mobilizes via wheelchair only.  Objective: Vitals:   12/30/19 0938 12/30/19 1000 12/30/19 1157 12/30/19 1201  BP: 117/72 109/60  117/69  Pulse: 86 83 79 69  Resp:  (!) 25 17 19   Temp:    98.5 F (36.9 C)  TempSrc:      SpO2:  96% 98%   Weight:      Height:        Intake/Output Summary (Last 24 hours) at 12/30/2019 1433 Last data filed at 12/30/2019 1146 Gross per 24 hour  Intake --  Output 1800 ml  Net -1800 ml   Filed Weights   12/30/19  0434  Weight: 112.9 kg    Examination:  General exam: Appears calm and comfortable Respiratory system: Diminished mostly secondary to body habitus. Respiratory effort normal. Cardiovascular system: S1 & S2 heard, RRR. No murmurs, rubs, gallops or clicks. Gastrointestinal system: Abdomen is nondistended, soft and nontender. No organomegaly or masses felt. Normal bowel sounds heard. Central nervous system: Alert and oriented. Right sided hemiparesis with 3/5 RLE and 0/5 RUE strength Musculoskeletal: LE edema, R>L UE edema. No calf tenderness Skin: No cyanosis. No rashes Psychiatry: Judgement and insight appear normal. Mood & affect appropriate.     Data Reviewed: I have personally reviewed following labs and imaging studies  CBC Lab Results  Component Value Date   WBC 10.8 (H) 12/29/2019   RBC 3.91 12/29/2019   HGB 12.2 12/29/2019   HCT 39.5 12/29/2019   MCV 101.0 (H) 12/29/2019   MCH 31.2 12/29/2019   PLT 248 12/29/2019   MCHC 30.9 12/29/2019   RDW 15.2 12/29/2019   LYMPHSABS 4.1 (H) 10/01/2018   MONOABS 0.7 10/01/2018   EOSABS 1.1 (H) 10/01/2018   BASOSABS 0.1 69/48/5462     Last metabolic panel Lab Results  Component Value Date   NA 140 12/30/2019   K 4.6 12/30/2019   CL 104 12/30/2019   CO2 28 12/30/2019   BUN 14 12/30/2019   CREATININE 1.42 (H) 12/30/2019   GLUCOSE 97 12/30/2019   GFRNONAA 39 (L) 12/30/2019   GFRAA 47 (L) 10/01/2018   CALCIUM 8.8 (L) 12/30/2019   PROT 7.5 12/29/2019   ALBUMIN 3.4 (L) 12/29/2019   BILITOT 0.3 12/29/2019   ALKPHOS 94 12/29/2019   AST 19 12/29/2019   ALT 11 12/29/2019   ANIONGAP 8 12/30/2019    CBG (last 3)  No results for input(s): GLUCAP in the last 72 hours.   GFR: Estimated Creatinine Clearance: 39.8 mL/min (A) (by C-G formula based on SCr of 1.42 mg/dL (H)).  Coagulation Profile: No results for input(s): INR, PROTIME in the last 168 hours.  Recent Results (from the past 240 hour(s))  Respiratory Panel by RT  PCR (Flu A&B, Covid) - Nasopharyngeal Swab     Status: None   Collection Time: 12/29/19  7:53 PM   Specimen: Nasopharyngeal Swab  Result Value Ref Range Status   SARS Coronavirus 2 by RT PCR NEGATIVE NEGATIVE Final    Comment: (NOTE) SARS-CoV-2 target nucleic acids are NOT DETECTED.  The SARS-CoV-2 RNA is generally detectable in upper respiratoy specimens during the acute phase of infection. The lowest concentration of SARS-CoV-2 viral copies this assay can detect is 131 copies/mL. A negative result does not preclude SARS-Cov-2 infection and should not be used as the sole basis for treatment or other patient management decisions. A negative result may occur with  improper specimen collection/handling, submission of specimen other than nasopharyngeal swab, presence of viral mutation(s) within the areas targeted by this assay, and inadequate number of viral copies (<131 copies/mL). A negative result must be combined with clinical observations, patient history, and epidemiological information. The expected result is Negative.  Fact Sheet for Patients:  PinkCheek.be  Fact Sheet for Healthcare Providers:  GravelBags.it  This test is no t yet approved or cleared by the [place] and  has been authorized for detection and/or diagnosis of SARS-CoV-2 by FDA under an Emergency Use Authorization (EUA). This EUA will remain  in effect (meaning this test can be used) for the duration of the COVID-19 declaration under Section 564(b)(1) of the Act, 21 U.S.C. section 360bbb-3(b)(1), unless the authorization is terminated or revoked sooner.     Influenza A by PCR NEGATIVE NEGATIVE Final   Influenza B by PCR NEGATIVE NEGATIVE Final    Comment: (NOTE) The Xpert Xpress SARS-CoV-2/FLU/RSV assay is intended as an aid in  the diagnosis of influenza from Nasopharyngeal swab specimens and  should not be used as a sole basis for  treatment. Nasal washings and  aspirates are unacceptable for Xpert Xpress SARS-CoV-2/FLU/RSV  testing.  Fact Sheet for Patients: PinkCheek.be  Fact Sheet for Healthcare Providers: GravelBags.it  This test is not yet approved or cleared by the [place] FDA and  has been authorized for detection and/or diagnosis of SARS-CoV-2 by  FDA under an Emergency Use Authorization (EUA). This EUA will remain  in effect (meaning this test can be used) for the duration of the  Covid-19 declaration under Section 564(b)(1) of the Act, 21  U.S.C. section 360bbb-3(b)(1), unless the authorization is  terminated or revoked. Performed at [facility], [place] [address], [place], [place] [address]         Radiology Studies: DG Chest 2 View  Result Date: 12/29/2019 CLINICAL DATA:  Chest pain short of breath EXAM: CHEST - 2 VIEW COMPARISON:  12/19/2017, CT 05/20/2017 FINDINGS: Surgical hardware in the cervical spine. Cardiomegaly without edema, focal opacity or pleural effusion. No pneumothorax. IMPRESSION: No active cardiopulmonary disease. Cardiomegaly. Electronically Signed   By: Donavan Foil M.D.   On: 12/29/2019 15:54   ECHOCARDIOGRAM COMPLETE  Result Date: 12/30/2019    ECHOCARDIOGRAM REPORT   Patient Name:   Holly Flores Date of Exam: 12/30/2019 Medical Rec #:  [id number]         Height:       60.1 in Accession #:    [id number]        Weight:       248.9 lb Date of Birth:  [birth year]         BSA:          2.051 m Patient Age:    27 years          BP:           119/73 mmHg Patient Gender: F                 HR:           79 bpm. Exam Location:  Inpatient Procedure: 2D Echo, Color Doppler and Cardiac Doppler Indications:    I50.33 Acute on chronic diastolic (congestive) heart failure  History:        Patient has prior history of Echocardiogram examinations, most                 recent 10/02/2018. Previous Myocardial Infarction, Stroke  and                 COPD, Signs/Symptoms:Dyspnea; Risk Factors:Hypertension,                 Dyslipidemia and Sleep Apnea. CKD. GERD.  Sonographer:    Jonelle Sidle Dance Referring Phys: [place]  1. Left ventricular ejection fraction, by estimation,  is 65 to 70%. The left ventricle has normal function. The left ventricle has no regional wall motion abnormalities. There is moderate left ventricular hypertrophy. Left ventricular diastolic parameters are consistent with Grade I diastolic dysfunction (impaired relaxation).  2. Right ventricular systolic function is normal. The right ventricular size is normal.  3. The mitral valve is normal in structure. No evidence of mitral valve regurgitation. No evidence of mitral stenosis.  4. The aortic valve is normal in structure. Aortic valve regurgitation is not visualized. No aortic stenosis is present.  5. Aortic dilatation noted. There is mild dilatation of the aortic root, measuring 40 mm.  6. The inferior vena cava is normal in size with greater than 50% respiratory variability, suggesting right atrial pressure of 3 mmHg. FINDINGS  Left Ventricle: Left ventricular ejection fraction, by estimation, is 65 to 70%. The left ventricle has normal function. The left ventricle has no regional wall motion abnormalities. The left ventricular internal cavity size was normal in size. There is  moderate left ventricular hypertrophy. Left ventricular diastolic parameters are consistent with Grade I diastolic dysfunction (impaired relaxation). Right Ventricle: The right ventricular size is normal. Right ventricular systolic function is normal. Left Atrium: Left atrial size was normal in size. Right Atrium: Right atrial size was normal in size. Pericardium: There is no evidence of pericardial effusion. Mitral Valve: The mitral valve is normal in structure. Mild mitral annular calcification. No evidence of mitral valve regurgitation. No evidence of mitral valve  stenosis. Tricuspid Valve: The tricuspid valve is normal in structure. Tricuspid valve regurgitation is not demonstrated. No evidence of tricuspid stenosis. Aortic Valve: The aortic valve is normal in structure. Aortic valve regurgitation is not visualized. No aortic stenosis is present. Pulmonic Valve: The pulmonic valve was not well visualized. Pulmonic valve regurgitation is not visualized. No evidence of pulmonic stenosis. Aorta: Aortic dilatation noted. There is mild dilatation of the aortic root, measuring 40 mm. Venous: The inferior vena cava is normal in size with greater than 50% respiratory variability, suggesting right atrial pressure of 3 mmHg. IAS/Shunts: No atrial level shunt detected by color flow Doppler.  LEFT VENTRICLE PLAX 2D LVIDd:         4.15 cm  Diastology LVIDs:         2.45 cm  LV e' medial:    4.90 cm/s LV PW:         1.47 cm  LV E/e' medial:  16.6 LV IVS:        1.38 cm  LV e' lateral:   5.44 cm/s LVOT diam:     2.00 cm  LV E/e' lateral: 14.9 LV SV:         86 LV SV Index:   42 LVOT Area:     3.14 cm  RIGHT VENTRICLE             IVC RV Basal diam:  2.80 cm     IVC diam: 1.53 cm RV S prime:     11.60 cm/s TAPSE (M-mode): 1.4 cm LEFT ATRIUM             Index       RIGHT ATRIUM           Index LA diam:        2.30 cm 1.12 cm/m  RA Area:     19.10 cm LA Vol (A2C):   82.5 ml 40.22 ml/m RA Volume:   50.80 ml  24.76 ml/m LA Vol (A4C):   46.6 ml  22.72 ml/m LA Biplane Vol: 65.9 ml 32.12 ml/m  AORTIC VALVE LVOT Vmax:   134.00 cm/s LVOT Vmean:  86.200 cm/s LVOT VTI:    0.273 m  AORTA Ao Root diam: 4.00 cm Ao Asc diam:  3.50 cm MITRAL VALVE MV Area (PHT): 3.27 cm    SHUNTS MV Decel Time: 232 msec    Systemic VTI:  0.27 m MV E velocity: 81.20 cm/s  Systemic Diam: 2.00 cm MV A velocity: 92.00 cm/s MV E/A ratio:  0.88 Kirk Ruths MD Electronically signed by Kirk Ruths MD Signature Date/Time: 12/30/2019/1:12:20 PM    Final         Scheduled Meds: . apixaban  5 mg Oral BID  .  atorvastatin  80 mg Oral q1800  . furosemide  40 mg Intravenous BID  . [START ON 12/31/2019] influenza vaccine adjuvanted  0.5 mL Intramuscular Tomorrow-1000  . metoprolol succinate  25 mg Oral Daily  . sodium chloride flush  3 mL Intravenous Q12H   Continuous Infusions: . sodium chloride       LOS: 0 days     [name], MD [facility] 12/30/2019, 2:33 PM  If 7PM-7AM, please contact night-coverage [email or web address]

## 2019-12-30 NOTE — Plan of Care (Signed)
  Problem: Health Behavior/Discharge Planning: Goal: Ability to manage health-related needs will improve Outcome: Progressing   Problem: Clinical Measurements: Goal: Ability to maintain clinical measurements within normal limits will improve Outcome: Progressing   Problem: Clinical Measurements: Goal: Will remain free from infection Outcome: Progressing   Problem: Clinical Measurements: Goal: Diagnostic test results will improve Outcome: Progressing   Problem: Clinical Measurements: Goal: Cardiovascular complication will be avoided Outcome: Progressing   Problem: Elimination: Goal: Will not experience complications related to urinary retention Outcome: Progressing   Problem: Pain Managment: Goal: General experience of comfort will improve Outcome: Progressing   Problem: Safety: Goal: Ability to remain free from injury will improve Outcome: Progressing   Problem: Clinical Measurements: Goal: Complications related to the disease process, condition or treatment will be avoided or minimized Outcome: Progressing   Problem: Education: Goal: Ability to demonstrate management of disease process will improve Outcome: Progressing   Problem: Education: Goal: Ability to verbalize understanding of medication therapies will improve Outcome: Progressing

## 2019-12-30 NOTE — Plan of Care (Signed)
  Problem: Education: Goal: Knowledge of General Education information will improve Description: Including pain rating scale, medication(s)/side effects and non-pharmacologic comfort measures Outcome: Progressing   Problem: Clinical Measurements: Goal: Will remain free from infection Outcome: Progressing   Problem: Nutrition: Goal: Adequate nutrition will be maintained Outcome: Progressing   Problem: Safety: Goal: Ability to remain free from injury will improve Outcome: Progressing   

## 2019-12-30 NOTE — Progress Notes (Signed)
  Echocardiogram 2D Echocardiogram has been performed.  Holly Flores 12/30/2019, 9:23 AM

## 2019-12-30 NOTE — Evaluation (Signed)
Occupational Therapy Evaluation Patient Details Name: Holly Flores MRN: [id number] DOB: Oct 26, 1945 Today's Date: 12/30/2019    History of Present Illness 74 y.o. female with medical history significant of stroke with residual R sided paralysis, dCHF, CKD3a, HTN, O2 at night, A.Flutter on eliquis.  New onset, progressively worsening peripheral edema in BLE, abd, and RUE for the past couple of weeks   Clinical Impression   Very kind lady that presents with worsening edema.  She is essentially at her baseline for bed mobility, stand pivot transfers, upper body and lower body ADL.  She tries very hard and is encouraged to sit up for a little while.  She has a stage two wound to her R buttocks.  Encouraged up in the recliner for meals, and to complete as much of her self care as possible.  Educated about requesting positioning off her R side in bed.  Discussed positioning of R arm on a pillow to help with swelling.  She needs a wider manual wheelchair for home use.  OT would assume a 22" wide wheelchair with a specialized pressure relief cushion would suffice.  HH OT is recommended for a safe transition home.  No further OT needs in the acute setting.  Encouraged nursing for out of bed.      Follow Up Recommendations  Home health OT    Equipment Recommendations  Wheelchair (22");Wheelchair cushion (22x18") ROHO cushion.   Recommendations for Other Services       Precautions / Restrictions Precautions Precautions: Fall Precaution Comments: O2 at night Restrictions Weight Bearing Restrictions: No Other Position/Activity Restrictions: R sided hemiparesis.      Mobility Bed Mobility Overal bed mobility: Needs Assistance Bed Mobility: Rolling;Sidelying to Sit Rolling: Mod assist Sidelying to sit: Mod assist            Transfers Overall transfer level: Needs assistance   Transfers: Sit to/from Stand;Stand Pivot Transfers Sit to Stand: Mod assist Stand pivot transfers: Mod  assist       General transfer comment: has trained with the steady lift in CIR    Balance Overall balance assessment: Needs assistance Sitting-balance support: Feet supported;Single extremity supported Sitting balance-Leahy Scale: Fair     Standing balance support: Single extremity supported Standing balance-Leahy Scale: Poor                             ADL either performed or assessed with clinical judgement   ADL Overall ADL's : Needs assistance/impaired Eating/Feeding: Set up;Sitting   Grooming: Wash/dry face;Wash/dry hands;Oral care;Set up;Sitting   Upper Body Bathing: Moderate assistance;Sitting   Lower Body Bathing: Maximal assistance;Bed level   Upper Body Dressing : Moderate assistance;Sitting   Lower Body Dressing: Maximal assistance;Bed level               Functional mobility during ADLs: Moderate assistance General ADL Comments: stand pivot to strong side.     Vision Patient Visual Report: No change from baseline       Perception     Praxis      Pertinent Vitals/Pain Pain Assessment: No/denies pain     Hand Dominance Right   Extremity/Trunk Assessment Upper Extremity Assessment Upper Extremity Assessment: RUE deficits/detail RUE Deficits / Details: flaccid RUE Sensation: decreased light touch RUE Coordination: decreased fine motor;decreased gross motor   Lower Extremity Assessment Lower Extremity Assessment: Defer to PT evaluation       Communication Communication Communication: No difficulties   Cognition Arousal/Alertness:  Awake/alert Behavior During Therapy: WFL for tasks assessed/performed Overall Cognitive Status: Within Functional Limits for tasks assessed                                     General Comments   VSS    Exercises     Shoulder Instructions      Home Living Family/patient expects to be discharged to:: Private residence Living Arrangements: Children Available Help at Discharge:  Family;Personal care attendant;Available 24 hours/day Type of Home: House Home Access: Ramped entrance     Home Layout: One level     Bathroom Shower/Tub: Occupational psychologist: Standard Bathroom Accessibility: Yes How Accessible: Accessible via wheelchair Home Equipment: [place] - single point;Shower seat;Bedside commode;Wheelchair - Education officer, community - power;Hospital bed;Hand held shower head;Grab bars - tub/shower   Additional Comments: Patient uses old wheelchair for the shower.  Patient states her current manual w/c is too narrow.      Prior Functioning/Environment Level of Independence: Needs assistance  Gait / Transfers Assistance Needed: Family and PCA assists with stand pivot transfers.  Uses w/c for mobility. ADL's / Homemaking Assistance Needed: PCA and family assist with bed baths.  Son will assist with showers once a week.  She is able to stand in the shower for assist with peri care. Communication / Swallowing Assistance Needed: WFL's          OT Problem List: Impaired balance (sitting and/or standing)      OT Treatment/Interventions:      OT Goals(Current goals can be found in the care plan section) Acute Rehab OT Goals Patient Stated Goal: Get back home and get into my routine OT Goal Formulation: With patient Time For Goal Achievement: 01/13/20 Potential to Achieve Goals: Good ADL Goals Pt Will Perform Grooming: with supervision;sitting Pt Will Perform Upper Body Bathing: with min assist;sitting Pt Will Perform Upper Body Dressing: with min assist;sitting Pt Will Transfer to Toilet: with min assist;stand pivot transfer;bedside commode  OT Frequency:     Barriers to D/C:  medical condition          Co-evaluation              AM-PAC OT "6 Clicks" Daily Activity     Outcome Measure Help from another person eating meals?: A Little Help from another person taking care of personal grooming?: A Little Help from another person toileting,  which includes using toliet, bedpan, or urinal?: A Lot Help from another person bathing (including washing, rinsing, drying)?: A Lot Help from another person to put on and taking off regular upper body clothing?: A Lot Help from another person to put on and taking off regular lower body clothing?: A Lot 6 Click Score: 14   End of Session Equipment Utilized During Treatment: Gait belt Nurse Communication: Mobility status  Activity Tolerance: Patient tolerated treatment well Patient left: in chair;with call bell/phone within reach  OT Visit Diagnosis: Unsteadiness on feet (R26.81)                Time: 1432-1500 OT Time Calculation (min): 28 min Charges:  OT General Charges $OT Visit: 1 Visit OT Evaluation $OT Eval Moderate Complexity: 1 Mod  12/30/2019  Rich, OTR/L  Acute Rehabilitation Services  Office:  [phone]   Holly Flores 12/30/2019, 3:14 PM

## 2019-12-30 NOTE — ED Notes (Signed)
Patient cleansed of small amount of light brown soft stool. Patient assisted with repositioning. Denies further needs.

## 2019-12-30 NOTE — ED Notes (Signed)
Attempted to call report, receiving RN busy at this time. Left number with unit to call back.

## 2019-12-30 NOTE — Progress Notes (Signed)
  Patient admitted to the floor from ED, alert and oriented times four, with right side paralysis s/p stroke. Oriented to to room/unit policies, patient with extensive skin breakdown/ excoriation on her right thigh, buttocks, and with multiple small open lesions, patient cleansed with soap and water and the perineal cleanser. bil groins and perineal area excoriated as well. Skin assessment done by I and  RN. Wound RN consult ordered. [place]  Placed, draining yellow light urine, VSS, 90% sats on RA, given supplemental O2 1L [place] with spo2 sats going up to 96%. Denies chest pain at this time, bed low and locked call bell within reach.

## 2019-12-31 DIAGNOSIS — I10 Essential (primary) hypertension: Secondary | ICD-10-CM | POA: Diagnosis not present

## 2019-12-31 DIAGNOSIS — N1831 Chronic kidney disease, stage 3a: Secondary | ICD-10-CM | POA: Diagnosis not present

## 2019-12-31 DIAGNOSIS — I5033 Acute on chronic diastolic (congestive) heart failure: Secondary | ICD-10-CM | POA: Diagnosis not present

## 2019-12-31 DIAGNOSIS — R601 Generalized edema: Secondary | ICD-10-CM | POA: Diagnosis not present

## 2019-12-31 LAB — BASIC METABOLIC PANEL
Anion gap: 8 (ref 5–15)
BUN: 15 mg/dL (ref 8–23)
CO2: 29 mmol/L (ref 22–32)
Calcium: 8.4 mg/dL — ABNORMAL LOW (ref 8.9–10.3)
Chloride: 100 mmol/L (ref 98–111)
Creatinine, Ser: 1.55 mg/dL — ABNORMAL HIGH (ref 0.44–1.00)
GFR, Estimated: 35 mL/min — ABNORMAL LOW (ref >60)
Glucose, Bld: 96 mg/dL (ref 70–99)
Potassium: 3.7 mmol/L (ref 3.5–5.1)
Sodium: 137 mmol/L (ref 135–145)

## 2019-12-31 MED ORDER — TORSEMIDE 20 MG PO TABS
20.0000 mg | ORAL_TABLET | Freq: Every day | ORAL | Status: DC
  Administered 2020-01-01 – 2020-01-02 (×2): 20 mg via ORAL
  Filled 2019-12-31 (×2): qty 1

## 2019-12-31 NOTE — Progress Notes (Signed)
PROGRESS NOTE    Holly Flores  GXQ:[id number] DOB: 1945/02/17 DOA: 12/29/2019 PCP: Wannetta Sender, FNP   Brief Narrative: Holly Flores is a 74 y.o. female with medical history significant of stroke with residual R sided paralysis, dCHF, CKD3a, HTN, O2 at night, A.Flutter on Eliquis. Patient presented secondary to dyspnea and chest pain and found to have anasarca. She was started on IV lasix. Treatment started for heart failure exacerbation.   Assessment & Plan:   Principal Problem:   Anasarca Active Problems:   Essential hypertension   Chronic kidney disease (CKD), stage III (moderate) (HCC)   Acute on chronic diastolic CHF (congestive heart failure) (HCC)   CHF (congestive heart failure) (HCC)   Anasarca Fluid overload likely secondary to heart failure. Improved with diuresis.  Acute on chronic diastolic heart failure Unknown etiology but possibly dietary indiscretion; patient not upfront about diet habits. States she has been adherent with torsemide, however. BNP of 72 in setting of obesity. Started on Lasix 40 mg IV daily. Patient does not know her dry weight and has not weighed herself for two years. No weight on admission, however, earliest weight of 112.2 kg on 11/13. UOP of 1100 mL over the last 24 hours. OT recommending [place] OT -Discontinue IV lasix. Start home torsemide 20 mg daily in AM -Daily weights/strict in and out  Acute on chronic respiratory failure with hypoxia Secondary to above. Patient reports nocturnal oxygen but not daytime oxygen. Weaned down to 0.5 L of oxygen via [place] -Wean to room air as able  Dyspnea Secondary to above. Improving with diuresis.  Essential hypertension Patient is on metoprolol, amlodipine, torsemide as an outpatient. BP controlled. -Continue metoprolol  CKD stage IIIa Baseline creatinine of 1.3. Sightly worsened with diuresis -AM BMP  History of atrial flutter -Continue Eliquis and  metoprolol  Hyperlipidemia -Continue Lipitor  History of CVA Residual right side hemiparesis. Stable. Patient is non-ambulatory. HHOT recommended.  Obesity Body mass index is 48.15 kg/m.   DVT prophylaxis: Eliquis Code Status:   Code Status: Full Code Family Communication: None at bedside Disposition Plan: Discharge likely home in 24 hours pending transition to oral diuretic and stable creatinine. [facility] with [facility]   Consultants:   None  Procedures:   TRANSTHORACIC ECHOCARDIOGRAM (12/30/2019) IMPRESSIONS    1. Left ventricular ejection fraction, by estimation, is 65 to 70%. The  left ventricle has normal function. The left ventricle has no regional  wall motion abnormalities. There is moderate left ventricular hypertrophy.  Left ventricular diastolic  parameters are consistent with Grade I diastolic dysfunction (impaired  relaxation).  2. Right ventricular systolic function is normal. The right ventricular  size is normal.  3. The mitral valve is normal in structure. No evidence of mitral valve  regurgitation. No evidence of mitral stenosis.  4. The aortic valve is normal in structure. Aortic valve regurgitation is  not visualized. No aortic stenosis is present.  5. Aortic dilatation noted. There is mild dilatation of the aortic root,  measuring 40 mm.  6. The inferior vena cava is normal in size with greater than 50%  respiratory variability, suggesting right atrial pressure of 3 mmHg.  Antimicrobials:  None    Subjective: Breathing well today.  Objective: Vitals:   12/31/19 0346 12/31/19 0744 12/31/19 0935 12/31/19 1105  BP: 112/75 111/67 115/65 113/65  Pulse: 61 62 73 75  Resp: 19 20 (!) 22 20  Temp: 98.4 F (36.9 C) 98.6 F (37 C)  98.9 F (  37.2 C)  TempSrc: Oral Oral  Oral  SpO2: 96% 97% 97% 95%  Weight: 112.2 kg     Height:        Intake/Output Summary (Last 24 hours) at 12/31/2019 1121 Last data filed at 12/31/2019 1106 Gross per  24 hour  Intake --  Output 1600 ml  Net -1600 ml   Filed Weights   12/30/19 0434 12/31/19 0346  Weight: 112.9 kg 112.2 kg    Examination:  General exam: Appears calm and comfortable  Respiratory system: Diminished in setting of body habitus. Respiratory effort normal. Cardiovascular system: S1 & S2 heard, RRR. No murmurs, rubs, gallops or clicks. Gastrointestinal system: Abdomen is nondistended, soft and nontender. No organomegaly or masses felt. Normal bowel sounds heard. Central nervous system: Alert and oriented. Musculoskeletal: BLE edema. No calf tenderness Skin: No cyanosis. No rashes Psychiatry: Judgement and insight appear normal. Mood & affect appropriate.     Data Reviewed: I have personally reviewed following labs and imaging studies  CBC Lab Results  Component Value Date   WBC 10.8 (H) 12/29/2019   RBC 3.91 12/29/2019   HGB 12.2 12/29/2019   HCT 39.5 12/29/2019   MCV 101.0 (H) 12/29/2019   MCH 31.2 12/29/2019   PLT 248 12/29/2019   MCHC 30.9 12/29/2019   RDW 15.2 12/29/2019   LYMPHSABS 4.1 (H) 10/01/2018   MONOABS 0.7 10/01/2018   EOSABS 1.1 (H) 10/01/2018   BASOSABS 0.1 52/84/1324     Last metabolic panel Lab Results  Component Value Date   NA 137 12/31/2019   K 3.7 12/31/2019   CL 100 12/31/2019   CO2 29 12/31/2019   BUN 15 12/31/2019   CREATININE 1.55 (H) 12/31/2019   GLUCOSE 96 12/31/2019   GFRNONAA 35 (L) 12/31/2019   GFRAA 47 (L) 10/01/2018   CALCIUM 8.4 (L) 12/31/2019   PROT 7.5 12/29/2019   ALBUMIN 3.4 (L) 12/29/2019   BILITOT 0.3 12/29/2019   ALKPHOS 94 12/29/2019   AST 19 12/29/2019   ALT 11 12/29/2019   ANIONGAP 8 12/31/2019    CBG (last 3)  No results for input(s): GLUCAP in the last 72 hours.   GFR: Estimated Creatinine Clearance: 36.3 mL/min (A) (by C-G formula based on SCr of 1.55 mg/dL (H)).  Coagulation Profile: No results for input(s): INR, PROTIME in the last 168 hours.  Recent Results (from the past 240  hour(s))  Respiratory Panel by RT PCR (Flu A&B, Covid) - Nasopharyngeal Swab     Status: None   Collection Time: 12/29/19  7:53 PM   Specimen: Nasopharyngeal Swab  Result Value Ref Range Status   SARS Coronavirus 2 by RT PCR NEGATIVE NEGATIVE Final    Comment: (NOTE) SARS-CoV-2 target nucleic acids are NOT DETECTED.  The SARS-CoV-2 RNA is generally detectable in upper respiratoy specimens during the acute phase of infection. The lowest concentration of SARS-CoV-2 viral copies this assay can detect is 131 copies/mL. A negative result does not preclude SARS-Cov-2 infection and should not be used as the sole basis for treatment or other patient management decisions. A negative result may occur with  improper specimen collection/handling, submission of specimen other than nasopharyngeal swab, presence of viral mutation(s) within the areas targeted by this assay, and inadequate number of viral copies (<131 copies/mL). A negative result must be combined with clinical observations, patient history, and epidemiological information. The expected result is Negative.  Fact Sheet for Patients:  PinkCheek.be  Fact Sheet for Healthcare Providers:  GravelBags.it  This test is no  t yet approved or cleared by the [place] and  has been authorized for detection and/or diagnosis of SARS-CoV-2 by FDA under an Emergency Use Authorization (EUA). This EUA will remain  in effect (meaning this test can be used) for the duration of the COVID-19 declaration under Section 564(b)(1) of the Act, 21 U.S.C. section 360bbb-3(b)(1), unless the authorization is terminated or revoked sooner.     Influenza A by PCR NEGATIVE NEGATIVE Final   Influenza B by PCR NEGATIVE NEGATIVE Final    Comment: (NOTE) The Xpert Xpress SARS-CoV-2/FLU/RSV assay is intended as an aid in  the diagnosis of influenza from Nasopharyngeal swab specimens and  should not  be used as a sole basis for treatment. Nasal washings and  aspirates are unacceptable for Xpert Xpress SARS-CoV-2/FLU/RSV  testing.  Fact Sheet for Patients: PinkCheek.be  Fact Sheet for Healthcare Providers: GravelBags.it  This test is not yet approved or cleared by the [place] FDA and  has been authorized for detection and/or diagnosis of SARS-CoV-2 by  FDA under an Emergency Use Authorization (EUA). This EUA will remain  in effect (meaning this test can be used) for the duration of the  Covid-19 declaration under Section 564(b)(1) of the Act, 21  U.S.C. section 360bbb-3(b)(1), unless the authorization is  terminated or revoked. Performed at [facility], [place] [address], [place], [place] [address]         Radiology Studies: DG Chest 2 View  Result Date: 12/29/2019 CLINICAL DATA:  Chest pain short of breath EXAM: CHEST - 2 VIEW COMPARISON:  12/19/2017, CT 05/20/2017 FINDINGS: Surgical hardware in the cervical spine. Cardiomegaly without edema, focal opacity or pleural effusion. No pneumothorax. IMPRESSION: No active cardiopulmonary disease. Cardiomegaly. Electronically Signed   By: Donavan Foil M.D.   On: 12/29/2019 15:54   ECHOCARDIOGRAM COMPLETE  Result Date: 12/30/2019    ECHOCARDIOGRAM REPORT   Patient Name:   Holly Flores Date of Exam: 12/30/2019 Medical Rec #:  [id number]         Height:       60.1 in Accession #:    [id number]        Weight:       248.9 lb Date of Birth:  02-18-1945         BSA:          2.051 m Patient Age:    19 years          BP:           119/73 mmHg Patient Gender: F                 HR:           79 bpm. Exam Location:  Inpatient Procedure: 2D Echo, Color Doppler and Cardiac Doppler Indications:    I50.33 Acute on chronic diastolic (congestive) heart failure  History:        Patient has prior history of Echocardiogram examinations, most                 recent 10/02/2018. Previous  Myocardial Infarction, Stroke and                 COPD, Signs/Symptoms:Dyspnea; Risk Factors:Hypertension,                 Dyslipidemia and Sleep Apnea. CKD. GERD.  Sonographer:    Jonelle Sidle Dance Referring Phys: [place]  1. Left ventricular ejection fraction, by estimation, is 65 to 70%.  The left ventricle has normal function. The left ventricle has no regional wall motion abnormalities. There is moderate left ventricular hypertrophy. Left ventricular diastolic parameters are consistent with Grade I diastolic dysfunction (impaired relaxation).  2. Right ventricular systolic function is normal. The right ventricular size is normal.  3. The mitral valve is normal in structure. No evidence of mitral valve regurgitation. No evidence of mitral stenosis.  4. The aortic valve is normal in structure. Aortic valve regurgitation is not visualized. No aortic stenosis is present.  5. Aortic dilatation noted. There is mild dilatation of the aortic root, measuring 40 mm.  6. The inferior vena cava is normal in size with greater than 50% respiratory variability, suggesting right atrial pressure of 3 mmHg. FINDINGS  Left Ventricle: Left ventricular ejection fraction, by estimation, is 65 to 70%. The left ventricle has normal function. The left ventricle has no regional wall motion abnormalities. The left ventricular internal cavity size was normal in size. There is  moderate left ventricular hypertrophy. Left ventricular diastolic parameters are consistent with Grade I diastolic dysfunction (impaired relaxation). Right Ventricle: The right ventricular size is normal. Right ventricular systolic function is normal. Left Atrium: Left atrial size was normal in size. Right Atrium: Right atrial size was normal in size. Pericardium: There is no evidence of pericardial effusion. Mitral Valve: The mitral valve is normal in structure. Mild mitral annular calcification. No evidence of mitral valve regurgitation. No  evidence of mitral valve stenosis. Tricuspid Valve: The tricuspid valve is normal in structure. Tricuspid valve regurgitation is not demonstrated. No evidence of tricuspid stenosis. Aortic Valve: The aortic valve is normal in structure. Aortic valve regurgitation is not visualized. No aortic stenosis is present. Pulmonic Valve: The pulmonic valve was not well visualized. Pulmonic valve regurgitation is not visualized. No evidence of pulmonic stenosis. Aorta: Aortic dilatation noted. There is mild dilatation of the aortic root, measuring 40 mm. Venous: The inferior vena cava is normal in size with greater than 50% respiratory variability, suggesting right atrial pressure of 3 mmHg. IAS/Shunts: No atrial level shunt detected by color flow Doppler.  LEFT VENTRICLE PLAX 2D LVIDd:         4.15 cm  Diastology LVIDs:         2.45 cm  LV e' medial:    4.90 cm/s LV PW:         1.47 cm  LV E/e' medial:  16.6 LV IVS:        1.38 cm  LV e' lateral:   5.44 cm/s LVOT diam:     2.00 cm  LV E/e' lateral: 14.9 LV SV:         86 LV SV Index:   42 LVOT Area:     3.14 cm  RIGHT VENTRICLE             IVC RV Basal diam:  2.80 cm     IVC diam: 1.53 cm RV S prime:     11.60 cm/s TAPSE (M-mode): 1.4 cm LEFT ATRIUM             Index       RIGHT ATRIUM           Index LA diam:        2.30 cm 1.12 cm/m  RA Area:     19.10 cm LA Vol (A2C):   82.5 ml 40.22 ml/m RA Volume:   50.80 ml  24.76 ml/m LA Vol (A4C):   46.6 ml 22.72 ml/m LA Biplane  Vol: 65.9 ml 32.12 ml/m  AORTIC VALVE LVOT Vmax:   134.00 cm/s LVOT Vmean:  86.200 cm/s LVOT VTI:    0.273 m  AORTA Ao Root diam: 4.00 cm Ao Asc diam:  3.50 cm MITRAL VALVE MV Area (PHT): 3.27 cm    SHUNTS MV Decel Time: 232 msec    Systemic VTI:  0.27 m MV E velocity: 81.20 cm/s  Systemic Diam: 2.00 cm MV A velocity: 92.00 cm/s MV E/A ratio:  0.88 Kirk Ruths MD Electronically signed by Kirk Ruths MD Signature Date/Time: 12/30/2019/1:12:20 PM    Final         Scheduled Meds: . apixaban   5 mg Oral BID  . atorvastatin  80 mg Oral q1800  . furosemide  40 mg Intravenous BID  . influenza vaccine adjuvanted  0.5 mL Intramuscular Tomorrow-1000  . metoprolol succinate  25 mg Oral Daily  . sodium chloride flush  3 mL Intravenous Q12H   Continuous Infusions: . sodium chloride       LOS: 1 day     [name], MD [facility] 12/31/2019, 11:21 AM  If 7PM-7AM, please contact night-coverage [email or web address]

## 2019-12-31 NOTE — Plan of Care (Signed)
  Problem: Clinical Measurements: Goal: Ability to maintain clinical measurements within normal limits will improve Outcome: Progressing Goal: Will remain free from infection Outcome: Progressing Goal: Respiratory complications will improve Outcome: Progressing Goal: Cardiovascular complication will be avoided Outcome: Progressing   Problem: Activity: Goal: Risk for activity intolerance will decrease Outcome: Progressing   

## 2020-01-01 ENCOUNTER — Encounter (HOSPITAL_COMMUNITY): Payer: Self-pay | Admitting: Internal Medicine

## 2020-01-01 DIAGNOSIS — I5033 Acute on chronic diastolic (congestive) heart failure: Secondary | ICD-10-CM | POA: Diagnosis not present

## 2020-01-01 LAB — BASIC METABOLIC PANEL
Anion gap: 5 (ref 5–15)
BUN: 17 mg/dL (ref 8–23)
CO2: 32 mmol/L (ref 22–32)
Calcium: 8.4 mg/dL — ABNORMAL LOW (ref 8.9–10.3)
Chloride: 99 mmol/L (ref 98–111)
Creatinine, Ser: 1.6 mg/dL — ABNORMAL HIGH (ref 0.44–1.00)
GFR, Estimated: 34 mL/min — ABNORMAL LOW (ref >60)
Glucose, Bld: 95 mg/dL (ref 70–99)
Potassium: 3.5 mmol/L (ref 3.5–5.1)
Sodium: 136 mmol/L (ref 135–145)

## 2020-01-01 NOTE — Consult Note (Signed)
[facility] Nurse Consult Note: Patient receiving care in [place]. Reason for Consult: "Presented to hospital with Rt Hip unstageable wound" Wound type: 3 small areas of MASD - IAD from urinary incontinence.  Patient states she "pee, pee, pees at night and it caused my skin to get sore" Pressure Injury POA: Yes/No/NA Measurement: na Wound bed: all are pink and shallow.  They currently have narrow foam dressings over them. Drainage (amount, consistency, odor) none Periwound: discolored patches of hyperpigmented skin from where the patient said her skin has healed from urinary damage. Dressing procedure/placement/frequency: q shift washing with soap and water, patting dry, then Crit[employer] clear.  Also, the patient has significant MASD-ITD to abdominal fold with heavy yeast and odor.  For this area:  Washing with soap and water, patting dry, then  Measure and cut length of InterDry to fit in skin folds that have skin breakdown Tuck InterDry fabric into skin folds in a single layer, allow for 2 inches of overhang from skin edges to allow for wicking to occur May remove to bathe; dry area thoroughly and then tuck into affected areas again Do not apply any creams or ointments when using InterDry DO NOT THROW AWAY FOR 5 DAYS unless soiled with stool DO NOT [facility] product, this will inactivate the silver in the material  New sheet of Interdry should be applied after 5 days of use if patient continues to have skin breakdown   Monitor the wound area(s) for worsening of condition such as: Signs/symptoms of infection,  Increase in size,  Development of or worsening of odor, Development of pain, or increased pain at the affected locations.  Notify the medical team if any of these develop.  Thank you for the consult.  Discussed plan of care with the patient and bedside nurse.  [facility] nurse will not follow at this time.  Please re-consult the [place] team if needed.  Val Riles, RN, MSN, CWOCN, CNS-BC, pager  [phone]

## 2020-01-01 NOTE — Discharge Summary (Addendum)
Physician Discharge Summary  Holly Flores AUQ:[id number] DOB: [birth year] DOA: 12/29/2019  PCP: Wannetta Sender, FNP  Admit date: 12/29/2019 Discharge date: 01/02/2020  Admitted From: Home Disposition: Home  Recommendations for Outpatient Follow-up:  1. Follow up with PCP in 1 week 2. Repeat BMP in 3-5 days 3. Please follow up on the following pending results: None  Home Health: PT, OT, Aide, RN Equipment/Devices: None  Discharge Condition: Stable CODE STATUS: Full code Diet recommendation: Heart healthy   Brief/Interim Summary:  Admission HPI written by Etta Quill, DO   Chief Complaint: SOB, CP  HPI: Holly Flores is a 74 y.o. female with medical history significant of stroke with residual R sided paralysis, dCHF, CKD3a, HTN, O2 at night, A.Flutter on eliquis.  Pt has been having new onset, progressively worsening peripheral edema in BLE, abd, and RUE for the past couple of weeks.  Yesterday pt began to have SOB and CP, pressure like sensation.  Located centrally.  Symptoms worsened today prompting her to come in to the ED.  Symptoms are occurring at rest.  No fever, no abd pain, no back pain, no vomiting, no palpitations.    Hospital course:  Anasarca Fluid overload likely secondary to heart failure. Improved with diuresis.  Acute on chronic diastolic heart failure Unknown etiology but possibly dietary indiscretion; patient not upfront about diet habits. States she has been adherent with torsemide, however. BNP of 72 in setting of obesity. Started on Lasix 40 mg IV daily. Patient does not know her dry weight and has not weighed herself for two years. No weight on admission, however, earliest weight of 112.2 kg on 11/13.Weight down to 109.6 kg on discharge. Since this episode is likely secondary to dietary indiscretion, will continue previous dose of torsemide. Outpatient follow-up with PCP.  Acute on chronic respiratory failure with  hypoxia Secondary to above. Patient reports nocturnal oxygen but not daytime oxygen. Weaned down to room air at rest. Patient can continue using nocturnal oxygen as previously prescribed.  Dyspnea Secondary to above. Improving with diuresis.  Essential hypertension Patient is on metoprolol, amlodipine, torsemide as an outpatient. BP controlled. Continue metoprolol  CKD stage IIIa Baseline creatinine of 1.3. Sightly worsened with diuresis. Transitioned to home torsemide dose on discharge. Follow-up BMP as an outpatient.  History of atrial flutter Continue Eliquis and metoprolol  Hyperlipidemia Continue Lipitor  History of CVA Residual right side hemiparesis. Stable. Patient is non-ambulatory. HHOT recommended.  Obesity Body mass index is 48.15 kg/m.  Discharge Diagnoses:  Principal Problem:   Acute on chronic diastolic CHF (congestive heart failure) (HCC) Active Problems:   Essential hypertension   Chronic kidney disease (CKD), stage III (moderate) (HCC)   Anasarca   CHF (congestive heart failure) (HCC)    Discharge Instructions  Discharge Instructions    Diet - low sodium heart healthy   Complete by: As directed    Discharge wound care:   Complete by: As directed    Wound care  Daily      Comments: Wash abdominal fold with soap and water. Pat dry. Place InterDry into the fold following these instructions:  Measure and cut length of InterDry to fit in skin folds that have skin breakdown  Tuck InterDry fabric into skin folds in a single layer, allow for 2 inches of overhang from skin edges to allow for wicking to occur May remove to bathe; dry area thoroughly and then tuck into affected areas again  Do not apply any  creams or ointments when using InterDry DO NOT THROW AWAY FOR 5 DAYS unless soiled with stool DO NOT [place] product, this will inactivate the silver in the material  New sheet of Interdry should be applied after 5 days of use if patient continues to  have skin breakdown    Wound care  Twice daily   Comments: Wash buttocks and perineal area with soap and water, pat dry. Apply Criticaid Clear (purple and white tube in clean utility) over entire area   Increase activity slowly   Complete by: As directed      Allergies as of 01/01/2020      Reactions   Penicillins Hives, Itching, Rash   Has patient had a PCN reaction causing immediate rash, facial/tongue/throat swelling, SOB or lightheadedness with hypotension: Yes Has patient had a PCN reaction causing severe rash involving mucus membranes or skin necrosis: Yes Has patient had a PCN reaction that required hospitalization: No Has patient had a PCN reaction occurring within the last 10 years: No If all of the above answers are "NO", then may proceed with Cephalosporin use.   Sulfa Antibiotics Swelling   Tongue swelling      Medication List    STOP taking these medications   amLODipine 10 MG tablet Commonly known as: NORVASC     TAKE these medications   albuterol 108 (90 Base) MCG/ACT inhaler Commonly known as: VENTOLIN HFA Inhale 2 puffs into the lungs every 6 (six) hours as needed (for wheezing/shortness of breath). What changed: when to take this   apixaban 5 MG Tabs tablet Commonly known as: Eliquis Take 1 tablet (5 mg total) by mouth 2 (two) times daily.   atorvastatin 80 MG tablet Commonly known as: LIPITOR Take 1 tablet (80 mg total) by mouth daily. What changed: when to take this   cyclobenzaprine 5 MG tablet Commonly known as: FLEXERIL Take 1 tablet (5 mg total) by mouth at bedtime.   hydroxypropyl methylcellulose / hypromellose 2.5 % ophthalmic solution Commonly known as: ISOPTO TEARS / GONIOVISC Place 1 drop into both eyes in the morning and at bedtime.   metoprolol succinate 25 MG 24 hr tablet Commonly known as: TOPROL-XL Take 1 tablet (25 mg total) by mouth daily.   potassium chloride SA 20 MEQ tablet Commonly known as: KLOR-CON Take 1 tablet (20 mEq  total) by mouth daily.   torsemide 20 MG tablet Commonly known as: DEMADEX Take 1 tablet (20 mg total) by mouth daily.            Discharge Care Instructions  (From admission, onward)         Start     Ordered   01/01/20 0000  Discharge wound care:       Comments: Wound care  Daily      Comments: Wash abdominal fold with soap and water. Pat dry. Place InterDry into the fold following these instructions:  Measure and cut length of InterDry to fit in skin folds that have skin breakdown  Tuck InterDry fabric into skin folds in a single layer, allow for 2 inches of overhang from skin edges to allow for wicking to occur May remove to bathe; dry area thoroughly and then tuck into affected areas again  Do not apply any creams or ointments when using InterDry DO NOT THROW AWAY FOR 5 DAYS unless soiled with stool DO NOT [facility] product, this will inactivate the silver in the material  New sheet of Interdry should be applied after 5 days of  use if patient continues to have skin breakdown    Wound care  Twice daily   Comments: Wash buttocks and perineal area with soap and water, pat dry. Apply [employer] Clear (purple and white tube in clean utility) over entire area   01/01/20 1414          Follow-up Information    [facility], [facility] Follow up.   Specialty: [place] Why: A representative from [facility] will contact you to arrange start date and time for your services Contact information: [address] [place] [address] [phone]        Wannetta Sender, FNP. Schedule an appointment as soon as possible for a visit in 1 week(s).   Specialty: Family Medicine Why: Hospital follow-up Contact information: [address] [place] [address] [phone]        Arnoldo Lenis, MD .   Specialty: Cardiology Contact information: [place]  [address] [phone]              Allergies  Allergen Reactions  . Penicillins Hives, Itching and Rash    Has patient had a PCN reaction causing immediate rash, facial/tongue/throat swelling, SOB or lightheadedness with hypotension: Yes Has patient had a PCN reaction causing severe rash involving mucus membranes or skin necrosis: Yes Has patient had a PCN reaction that required hospitalization: No Has patient had a PCN reaction occurring within the last 10 years: No If all of the above answers are "NO", then may proceed with Cephalosporin use.   . Sulfa Antibiotics Swelling    Tongue swelling    Consultations:  None   Procedures/Studies: DG Chest 2 View  Result Date: 12/29/2019 CLINICAL DATA:  Chest pain short of breath EXAM: CHEST - 2 VIEW COMPARISON:  12/19/2017, CT 05/20/2017 FINDINGS: Surgical hardware in the cervical spine. Cardiomegaly without edema, focal opacity or pleural effusion. No pneumothorax. IMPRESSION: No active cardiopulmonary disease. Cardiomegaly. Electronically Signed   By: Donavan Foil M.D.   On: 12/29/2019 15:54   ECHOCARDIOGRAM COMPLETE  Result Date: 12/30/2019    ECHOCARDIOGRAM REPORT   Patient Name:   Holly Flores Date of Exam: 12/30/2019 Medical Rec #:  [id number]         Height:       60.1 in Accession #:    [id number]        Weight:       248.9 lb Date of Birth:  1945/04/09         BSA:          2.051 m Patient Age:    32 years          BP:           119/73 mmHg Patient Gender: F                 HR:           79 bpm. Exam Location:  Inpatient Procedure: 2D Echo, Color Doppler and Cardiac Doppler Indications:    I50.33 Acute on chronic diastolic (congestive) heart failure  History:        Patient has prior history of Echocardiogram examinations, most                 recent 10/02/2018. Previous Myocardial Infarction, Stroke and                 COPD,  Signs/Symptoms:Dyspnea; Risk Factors:Hypertension,                 Dyslipidemia and Sleep Apnea. CKD.  GERD.  Sonographer:    Jonelle Sidle Dance Referring Phys: [place]  1. Left ventricular ejection fraction, by estimation, is 65 to 70%. The left ventricle has normal function. The left ventricle has no regional wall motion abnormalities. There is moderate left ventricular hypertrophy. Left ventricular diastolic parameters are consistent with Grade I diastolic dysfunction (impaired relaxation).  2. Right ventricular systolic function is normal. The right ventricular size is normal.  3. The mitral valve is normal in structure. No evidence of mitral valve regurgitation. No evidence of mitral stenosis.  4. The aortic valve is normal in structure. Aortic valve regurgitation is not visualized. No aortic stenosis is present.  5. Aortic dilatation noted. There is mild dilatation of the aortic root, measuring 40 mm.  6. The inferior vena cava is normal in size with greater than 50% respiratory variability, suggesting right atrial pressure of 3 mmHg. FINDINGS  Left Ventricle: Left ventricular ejection fraction, by estimation, is 65 to 70%. The left ventricle has normal function. The left ventricle has no regional wall motion abnormalities. The left ventricular internal cavity size was normal in size. There is  moderate left ventricular hypertrophy. Left ventricular diastolic parameters are consistent with Grade I diastolic dysfunction (impaired relaxation). Right Ventricle: The right ventricular size is normal. Right ventricular systolic function is normal. Left Atrium: Left atrial size was normal in size. Right Atrium: Right atrial size was normal in size. Pericardium: There is no evidence of pericardial effusion. Mitral Valve: The mitral valve is normal in structure. Mild mitral annular calcification. No evidence of mitral valve regurgitation. No evidence of mitral valve stenosis. Tricuspid Valve: The tricuspid valve is normal in structure. Tricuspid valve regurgitation is not demonstrated. No evidence of  tricuspid stenosis. Aortic Valve: The aortic valve is normal in structure. Aortic valve regurgitation is not visualized. No aortic stenosis is present. Pulmonic Valve: The pulmonic valve was not well visualized. Pulmonic valve regurgitation is not visualized. No evidence of pulmonic stenosis. Aorta: Aortic dilatation noted. There is mild dilatation of the aortic root, measuring 40 mm. Venous: The inferior vena cava is normal in size with greater than 50% respiratory variability, suggesting right atrial pressure of 3 mmHg. IAS/Shunts: No atrial level shunt detected by color flow Doppler.  LEFT VENTRICLE PLAX 2D LVIDd:         4.15 cm  Diastology LVIDs:         2.45 cm  LV e' medial:    4.90 cm/s LV PW:         1.47 cm  LV E/e' medial:  16.6 LV IVS:        1.38 cm  LV e' lateral:   5.44 cm/s LVOT diam:     2.00 cm  LV E/e' lateral: 14.9 LV SV:         86 LV SV Index:   42 LVOT Area:     3.14 cm  RIGHT VENTRICLE             IVC RV Basal diam:  2.80 cm     IVC diam: 1.53 cm RV S prime:     11.60 cm/s TAPSE (M-mode): 1.4 cm LEFT ATRIUM             Index       RIGHT ATRIUM  Index LA diam:        2.30 cm 1.12 cm/m  RA Area:     19.10 cm LA Vol (A2C):   82.5 ml 40.22 ml/m RA Volume:   50.80 ml  24.76 ml/m LA Vol (A4C):   46.6 ml 22.72 ml/m LA Biplane Vol: 65.9 ml 32.12 ml/m  AORTIC VALVE LVOT Vmax:   134.00 cm/s LVOT Vmean:  86.200 cm/s LVOT VTI:    0.273 m  AORTA Ao Root diam: 4.00 cm Ao Asc diam:  3.50 cm MITRAL VALVE MV Area (PHT): 3.27 cm    SHUNTS MV Decel Time: 232 msec    Systemic VTI:  0.27 m MV E velocity: 81.20 cm/s  Systemic Diam: 2.00 cm MV A velocity: 92.00 cm/s MV E/A ratio:  0.88 Kirk Ruths MD Electronically signed by Kirk Ruths MD Signature Date/Time: 12/30/2019/1:12:20 PM    Final       Subjective: No dyspnea today.  Discharge Exam: Vitals:   01/01/20 1043 01/01/20 1140  BP: 107/70 124/71  Pulse: 66 67  Resp: 19 20  Temp:  98.7 F (37.1 C)  SpO2: 91% 97%   Vitals:     01/01/20 0643 01/01/20 0700 01/01/20 1043 01/01/20 1140  BP: 110/65 108/67 107/70 124/71  Pulse: 66 62 66 67  Resp: (!) 23 20 19 20   Temp: 98.4 F (36.9 C) 98.7 F (37.1 C)  98.7 F (37.1 C)  TempSrc: Oral Oral  Oral  SpO2: 94% 95% 91% 97%  Weight: 109.6 kg     Height:        General: Pt is alert, awake, not in acute distress Cardiovascular: RRR, S1/S2 +, no rubs, no gallops Respiratory: CTA bilaterally but diminished secondary to anterior auscultation and body habitus. No wheezing, no rhonchi Abdominal: Soft, NT, ND, bowel sounds + Extremities: BLE edema, no cyanosis    The results of significant diagnostics from this hospitalization (including imaging, microbiology, ancillary and laboratory) are listed below for reference.     Microbiology: Recent Results (from the past 240 hour(s))  Respiratory Panel by RT PCR (Flu A&B, Covid) - Nasopharyngeal Swab     Status: None   Collection Time: 12/29/19  7:53 PM   Specimen: Nasopharyngeal Swab  Result Value Ref Range Status   SARS Coronavirus 2 by RT PCR NEGATIVE NEGATIVE Final    Comment: (NOTE) SARS-CoV-2 target nucleic acids are NOT DETECTED.  The SARS-CoV-2 RNA is generally detectable in upper respiratoy specimens during the acute phase of infection. The lowest concentration of SARS-CoV-2 viral copies this assay can detect is 131 copies/mL. A negative result does not preclude SARS-Cov-2 infection and should not be used as the sole basis for treatment or other patient management decisions. A negative result may occur with  improper specimen collection/handling, submission of specimen other than nasopharyngeal swab, presence of viral mutation(s) within the areas targeted by this assay, and inadequate number of viral copies (<131 copies/mL). A negative result must be combined with clinical observations, patient history, and epidemiological information. The expected result is Negative.  Fact Sheet for Patients:   PinkCheek.be  Fact Sheet for Healthcare Providers:  GravelBags.it  This test is no t yet approved or cleared by the [place] FDA and  has been authorized for detection and/or diagnosis of SARS-CoV-2 by FDA under an Emergency Use Authorization (EUA). This EUA will remain  in effect (meaning this test can be used) for the duration of the COVID-19 declaration under Section 564(b)(1) of the Act, 21 U.S.C. section  360bbb-3(b)(1), unless the authorization is terminated or revoked sooner.     Influenza A by PCR NEGATIVE NEGATIVE Final   Influenza B by PCR NEGATIVE NEGATIVE Final    Comment: (NOTE) The Xpert Xpress SARS-CoV-2/FLU/RSV assay is intended as an aid in  the diagnosis of influenza from Nasopharyngeal swab specimens and  should not be used as a sole basis for treatment. Nasal washings and  aspirates are unacceptable for Xpert Xpress SARS-CoV-2/FLU/RSV  testing.  Fact Sheet for Patients: PinkCheek.be  Fact Sheet for Healthcare Providers: GravelBags.it  This test is not yet approved or cleared by the [place] FDA and  has been authorized for detection and/or diagnosis of SARS-CoV-2 by  FDA under an Emergency Use Authorization (EUA). This EUA will remain  in effect (meaning this test can be used) for the duration of the  Covid-19 declaration under Section 564(b)(1) of the Act, 21  U.S.C. section 360bbb-3(b)(1), unless the authorization is  terminated or revoked. Performed at [facility], [place] [address] [place], [place] [address]      Labs: BNP (last 3 results) Recent Labs    12/29/19 1524  BNP 47.4   Basic Metabolic Panel: Recent Labs  Lab 12/29/19 1524 12/30/19 0236 12/31/19 0240 01/01/20 0401  NA 140 140 137 136  K 4.1 4.6 3.7 3.5  CL 104 104 100 99  CO2 29 28 29  32  GLUCOSE 111* 97 96 95  BUN 13 14 15 17   CREATININE 1.35*  1.42* 1.55* 1.60*  CALCIUM 8.8* 8.8* 8.4* 8.4*   Liver Function Tests: Recent Labs  Lab 12/29/19 2310  AST 19  ALT 11  ALKPHOS 94  BILITOT 0.3  PROT 7.5  ALBUMIN 3.4*   No results for input(s): LIPASE, AMYLASE in the last 168 hours. No results for input(s): AMMONIA in the last 168 hours. CBC: Recent Labs  Lab 12/29/19 1524  WBC 10.8*  HGB 12.2  HCT 39.5  MCV 101.0*  PLT 248   Cardiac Enzymes: No results for input(s): CKTOTAL, CKMB, CKMBINDEX, TROPONINI in the last 168 hours. BNP: Invalid input(s): POCBNP CBG: No results for input(s): GLUCAP in the last 168 hours. D-Dimer Recent Labs    12/29/19 1912  DDIMER 0.69*   Hgb A1c No results for input(s): HGBA1C in the last 72 hours. Lipid Profile No results for input(s): CHOL, HDL, LDLCALC, TRIG, CHOLHDL, LDLDIRECT in the last 72 hours. Thyroid function studies No results for input(s): TSH, T4TOTAL, T3FREE, THYROIDAB in the last 72 hours.  Invalid input(s): FREET3 Anemia work up No results for input(s): VITAMINB12, FOLATE, FERRITIN, TIBC, IRON, RETICCTPCT in the last 72 hours. Urinalysis    Component Value Date/Time   COLORURINE YELLOW 12/29/2019 2145   APPEARANCEUR CLEAR 12/29/2019 2145   LABSPEC 1.019 12/29/2019 2145   PHURINE 5.0 12/29/2019 2145   GLUCOSEU NEGATIVE 12/29/2019 2145   HGBUR NEGATIVE 12/29/2019 2145   BILIRUBINUR NEGATIVE 12/29/2019 2145   KETONESUR NEGATIVE 12/29/2019 2145   PROTEINUR NEGATIVE 12/29/2019 2145   UROBILINOGEN 0.2 12/29/2014 2314   NITRITE NEGATIVE 12/29/2019 2145   LEUKOCYTESUR NEGATIVE 12/29/2019 2145   Sepsis Labs Invalid input(s): PROCALCITONIN,  WBC,  LACTICIDVEN Microbiology Recent Results (from the past 240 hour(s))  Respiratory Panel by RT PCR (Flu A&B, Covid) - Nasopharyngeal Swab     Status: None   Collection Time: 12/29/19  7:53 PM   Specimen: Nasopharyngeal Swab  Result Value Ref Range Status   SARS Coronavirus 2 by RT PCR NEGATIVE NEGATIVE Final  Comment:  (NOTE) SARS-CoV-2 target nucleic acids are NOT DETECTED.  The SARS-CoV-2 RNA is generally detectable in upper respiratoy specimens during the acute phase of infection. The lowest concentration of SARS-CoV-2 viral copies this assay can detect is 131 copies/mL. A negative result does not preclude SARS-Cov-2 infection and should not be used as the sole basis for treatment or other patient management decisions. A negative result may occur with  improper specimen collection/handling, submission of specimen other than nasopharyngeal swab, presence of viral mutation(s) within the areas targeted by this assay, and inadequate number of viral copies (<131 copies/mL). A negative result must be combined with clinical observations, patient history, and epidemiological information. The expected result is Negative.  Fact Sheet for Patients:  PinkCheek.be  Fact Sheet for Healthcare Providers:  GravelBags.it  This test is no t yet approved or cleared by the [place] FDA and  has been authorized for detection and/or diagnosis of SARS-CoV-2 by FDA under an Emergency Use Authorization (EUA). This EUA will remain  in effect (meaning this test can be used) for the duration of the COVID-19 declaration under Section 564(b)(1) of the Act, 21 U.S.C. section 360bbb-3(b)(1), unless the authorization is terminated or revoked sooner.     Influenza A by PCR NEGATIVE NEGATIVE Final   Influenza B by PCR NEGATIVE NEGATIVE Final    Comment: (NOTE) The Xpert Xpress SARS-CoV-2/FLU/RSV assay is intended as an aid in  the diagnosis of influenza from Nasopharyngeal swab specimens and  should not be used as a sole basis for treatment. Nasal washings and  aspirates are unacceptable for Xpert Xpress SARS-CoV-2/FLU/RSV  testing.  Fact Sheet for Patients: PinkCheek.be  Fact Sheet for Healthcare  Providers: GravelBags.it  This test is not yet approved or cleared by the [place] FDA and  has been authorized for detection and/or diagnosis of SARS-CoV-2 by  FDA under an Emergency Use Authorization (EUA). This EUA will remain  in effect (meaning this test can be used) for the duration of the  Covid-19 declaration under Section 564(b)(1) of the Act, 21  U.S.C. section 360bbb-3(b)(1), unless the authorization is  terminated or revoked. Performed at [facility], [place] [address] [place], [place] [address]      Time coordinating discharge: 35 minutes  SIGNED:   [name], MD [facility] 01/01/2020, 2:15 PM

## 2020-01-01 NOTE — Discharge Instructions (Addendum)
Holly Flores,  You were in the hospital because of too much fluid. You were given Lasix IV with significant improvement. Please adhere to a low salt diet and continue your torsemide. Please follow-up with your PCP.  Information on my medicine - ELIQUIS (apixaban)  Why was Eliquis prescribed for you? Eliquis was prescribed for you to reduce the risk of a blood clot forming that can cause a stroke if you have a medical condition called atrial fibrillation (a type of irregular heartbeat).  What do You need to know about Eliquis ? Take your Eliquis TWICE DAILY - one tablet in the morning and one tablet in the evening with or without food. If you have difficulty swallowing the tablet whole please discuss with your pharmacist how to take the medication safely.  Take Eliquis exactly as prescribed by your doctor and DO NOT stop taking Eliquis without talking to the doctor who prescribed the medication.  Stopping may increase your risk of developing a stroke.  Refill your prescription before you run out.  After discharge, you should have regular check-up appointments with your healthcare provider that is prescribing your Eliquis.  In the future your dose may need to be changed if your kidney function or weight changes by a significant amount or as you get older.  What do you do if you miss a dose? If you miss a dose, take it as soon as you remember on the same day and resume taking twice daily.  Do not take more than one dose of ELIQUIS at the same time to make up a missed dose.  Important Safety Information A possible side effect of Eliquis is bleeding. You should call your healthcare provider right away if you experience any of the following: ? Bleeding from an injury or your nose that does not stop. ? Unusual colored urine (red or dark brown) or unusual colored stools (red or black). ? Unusual bruising for unknown reasons. ? A serious fall or if you hit your head (even if there is no  bleeding).  Some medicines may interact with Eliquis and might increase your risk of bleeding or clotting while on Eliquis. To help avoid this, consult your healthcare provider or pharmacist prior to using any new prescription or non-prescription medications, including herbals, vitamins, non-steroidal anti-inflammatory drugs (NSAIDs) and supplements.  This website has more information on Eliquis (apixaban): http://www.eliquis.com/eliquis/home

## 2020-01-01 NOTE — TOC Initial Note (Signed)
Transition of Care [facility]) - Initial/Assessment Note    Patient Details  Name: Holly Flores MRN: [id number] Date of Birth: 02-05-1946  Transition of Care [facility]) CM/SW Contact:    Ninfa Meeker, RN Phone Number: 01/01/2020, 12:30 PM  Clinical Narrative:  Patient is a 74 y.o. femalewith medical history significant ofstroke with residual R sided paralysis, on Eliquis. Admitted with Heart Failure exacerbation. Patient also has right hip wound. Case manager contacted patient's granddaughter [phone] who says she is arranging things for her grandmother. Caryl Pina states they had a Aide coming into the home but she was stealing things. Patient's husband used to assist with her care, but He unfortunately died in 25-Apr-2022. She states patient does not qualify for medicaid and they can not afford daily cost for daily aide service. Caryl Pina said that family members work during the day but care for patient in evenings and on weekends. Case manager explained that we can arrange for Home Health services as ordered by MD, but we do not arrange custodial care, that remains a family obligation. Discussed Home Health options, referral called to Adela Lank , [facility] Liaison. TOC Team will continue to monitor.   Expected Discharge Plan: [place] Barriers to Discharge: Continued Medical Work up   Patient Goals and CMS Choice Patient states their goals for this hospitalization and ongoing recovery are:: per granddaughter: to get better CMS Medicare.gov Compare Post Acute Care list provided to:: Patient Represenative (must comment) (granddaughter Caryl Pina) Choice offered to / list presented to : Adult Children  Expected Discharge Plan and Services Expected Discharge Plan: [place] In-house Referral: NA Discharge Planning Services: CM Consult Post Acute Care Choice: [place] arrangements for the past 2 months: [place]                    DME Agency: NA       HH Arranged: RN, PT, OT, Nurse's Aide [place] Agency: [place] Date [name] Agency Contacted: 01/01/20 Time HH Agency Contacted: 64 Representative spoke with at [place]: Adela Lank  Prior Living Arrangements/Services Living arrangements for the past 2 months: [place] Lives with:: Self Patient language and need for interpreter reviewed:: Yes Do you feel safe going back to the place where you live?: Yes      Need for Family Participation in Patient Care: Yes (Comment) Care giver support system in place?: Yes (comment)   Criminal Activity/Legal Involvement Pertinent to Current Situation/Hospitalization: No - Comment as needed  Activities of Daily Living Home Assistive Devices/Equipment: Dentures (specify type) ADL Screening (condition at time of admission) Patient's cognitive ability adequate to safely complete daily activities?: Yes Is the patient deaf or have difficulty hearing?: No Does the patient have difficulty seeing, even when wearing glasses/contacts?: No Does the patient have difficulty concentrating, remembering, or making decisions?: No Patient able to express need for assistance with ADLs?: Yes Does the patient have difficulty dressing or bathing?: Yes Independently performs ADLs?: No Communication: Independent Dressing (OT): Needs assistance Is this a change from baseline?: Pre-admission baseline Grooming: Needs assistance Is this a change from baseline?: Pre-admission baseline Bathing: Needs assistance Is this a change from baseline?: Pre-admission baseline Toileting: Needs assistance Is this a change from baseline?: Pre-admission baseline In/Out Bed: Needs assistance Is this a change from baseline?: Pre-admission baseline Walks in Home: Needs assistance Is this a change from baseline?: Pre-admission baseline Does the patient have difficulty walking or climbing  stairs?: Yes Weakness of Legs: Right Weakness of  Arms/Hands: Right  Permission Sought/Granted Permission sought to share information with : Case Manager Permission granted to share information with : Yes, Release of Information Signed     Permission granted to share info w AGENCY: [place]        Emotional Assessment       Orientation: : Oriented to Self, Oriented to Place, Oriented to  Time, Oriented to Situation Alcohol / Substance Use: Not Applicable Psych Involvement: No (comment)  Admission diagnosis:  Anasarca [R60.1] Extremity edema [R60.0] Acute on chronic congestive heart failure, unspecified heart failure type (HCC) [I50.9] CHF (congestive heart failure) ([place]) [I50.9] Patient Active Problem List   Diagnosis Date Noted  . Acute on chronic diastolic CHF (congestive heart failure) ([place]) 12/30/2019  . CHF (congestive heart failure) ([place]) 12/30/2019  . Anasarca 12/29/2019  . Acute CVA (cerebrovascular accident) ([place]) 10/01/2018  . Physical deconditioning 12/19/2017  . Chronic kidney disease (CKD), stage III (moderate) (HCC)   . Hypoalbuminemia due to protein-calorie malnutrition ([place])   . Intractable migraine without status migrainosus   . Reactive depression   . Hemiparesis affecting right side as late effect of stroke ([place])   . Slow transit constipation   . PAF (paroxysmal atrial fibrillation) ([place])   . Dysphasia, post-stroke   . COPD (chronic obstructive pulmonary disease) ([place]) 10/29/2017  . Chronic diastolic CHF (congestive heart failure) ([place]) 10/29/2017  . Hypothyroidism 10/29/2017  . Essential hypertension 10/29/2017  . GERD (gastroesophageal reflux disease) 10/29/2017  . Anxiety 10/29/2017  . 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 Hyperlipidemia 04/20/2013   PCP:  Wannetta Sender, FNP Pharmacy:   [place], [address] [place] [address] Phone: [phone] Fax: [phone]  [place], [address] [address] Phone: [phone] Fax: [phone]     Social Determinants of Health (SDOH) Interventions    Readmission Risk Interventions No flowsheet data found.

## 2020-01-02 NOTE — TOC Transition Note (Signed)
Transition of Care [facility]) - CM/SW Discharge Note   Patient Details  Name: Holly Flores MRN: [id number] Date of Birth: 07-22-1945  Transition of Care [facility]) CM/SW Contact:  Zenon Mayo, RN Phone Number: 01/02/2020, 12:37 PM   Clinical Narrative:    Patient is for dc today, NCM notified Tommi Rumps with [facility]. NCM was informed by Staff RN , Mickel Baas that patient will need a wheel chair.  NCM made referral to [facility] with [employer] for the lightweight wheelchair.   Final next level of care: Home w Home Health Services Barriers to Discharge: No Barriers Identified   Patient Goals and CMS Choice Patient states their goals for this hospitalization and ongoing recovery are:: per granddaughter: to get better CMS Medicare.gov Compare Post Acute Care list provided to:: Patient Represenative (must comment) (granddaughter Caryl Pina) Choice offered to / list presented to : Adult Children  Discharge Placement                       Discharge Plan and Services In-house Referral: NA Discharge Planning Services: CM Consult Post Acute Care Choice: Home Health          DME Arranged: Lightweight manual wheelchair with seat cushion DME Agency: [employer]       HH Arranged: RN, PT, OT, Nurse's Aide [place] Agency: [place] Date [facility] Agency Contacted: 01/01/20 Time [place] Agency Contacted: 85 Representative spoke with at [place]: Adela Lank  Social Determinants of Health ([place]) Interventions     Readmission Risk Interventions No flowsheet data found.

## 2020-01-02 NOTE — Progress Notes (Signed)
Patient seen and examined. No changes to previous discharge recommendations. Patient is stable.  [name], MD [facility] 01/02/2020, 10:20 AM

## 2020-01-02 NOTE — TOC Transition Note (Addendum)
Transition of Care [facility]) - CM/SW Discharge Note   Patient Details  Name: Holly Flores MRN: [id number] Date of Birth: 10-Jul-1945  Transition of Care [facility]) CM/SW Contact:  Zenon Mayo, RN Phone Number: 01/02/2020, 9:32 AM   Clinical Narrative:    Patient is for dc today, NCM notified Tommi Rumps with [facility]. NCM was informed by Staff RN , Mickel Baas that patient will need a wheel chair.  NCM made referral to [facility] with [employer] for the w/chair.    Final next level of care: Home w Home Health Services Barriers to Discharge: No Barriers Identified   Patient Goals and CMS Choice Patient states their goals for this hospitalization and ongoing recovery are:: per granddaughter: to get better CMS Medicare.gov Compare Post Acute Care list provided to:: Patient Represenative (must comment) (granddaughter Caryl Pina) Choice offered to / list presented to : Adult Children  Discharge Placement                       Discharge Plan and Services In-house Referral: NA Discharge Planning Services: CM Consult Post Acute Care Choice: Home Health            DME Agency: NA, Wheel[employer]       HH Arranged: RN, PT, OT, Nurse's Aide [place] Agency: [place] Date [facility] Agency Contacted: 01/01/20 Time HH Agency Contacted: 68 Representative spoke with at [place]: Adela Lank  Social Determinants of Health (SDOH) Interventions     Readmission Risk Interventions No flowsheet data found.

## 2020-01-02 NOTE — Progress Notes (Signed)
Spoke with granddaughter Caryl Pina and reviewed AVS. She spoke to [place] and everything has been set up. Patient was wheeled out to the car in her wheelchair. Reviewed information above and that patient got her flu vaccine. Questions answered. Denied further questions after discussion.

## 2020-01-02 NOTE — Progress Notes (Signed)
    Durable Medical Equipment  (From admission, onward)         Start     Ordered   01/02/20 1228  For home use only DME lightweight manual wheelchair with seat cushion  Once       Comments: Patient suffers from weakness which impairs their ability to perform daily activities like bathing and dressing in the home.  A walker or cane will not resolve  issue with performing activities of daily living. A wheelchair will allow patient to safely perform daily activities. Patient is not able to propel themselves in the home using a standard weight wheelchair due to weakness. Patient can self propel in the lightweight wheelchair. Length of need lifetime. Accessories: elevating leg rests (ELRs), wheel locks, extensions and anti-tippers.   01/02/20 1229

## 2020-01-09 ENCOUNTER — Telehealth: Payer: Self-pay | Admitting: *Deleted

## 2020-01-09 MED ORDER — TORSEMIDE 20 MG PO TABS: 20.0000 mg | ORAL_TABLET | Freq: Every day | ORAL | 2 refills | Status: AC

## 2020-01-09 NOTE — Telephone Encounter (Signed)
Home health nurse out in home for evaluation and reports patient has not been taking torsemide and did not have it in the home. Also says patient was taking amlodipine and was not taking toprol. Medications corrected by nurse since amlodipine was stopped at d/c. [facility] nurse reports 2+ BLE with diminished bilateral lower lung sound O2 Sat 92 % on room air, HR 68 & BP 120/82  Reports SOB that is unchanged. Reports dizziness since yesterday. Denies chest pain. Patient is unable to weigh at home due to physical condition Advised that refill for torsemide at current dose sent to [facility], first available appointment given to see AQ, NP on Monday, 01/15/2020 @11 :00 am. Advised if symptoms got worse, to go to the ED for an evaluation. Verbalized understanding of plan.

## 2020-01-14 NOTE — Progress Notes (Signed)
Cardiology Office Note  Date: 01/15/2020   ID: OMAIRA Flores, DOB [birth year], MRN [id number]  PCP:  Wannetta Sender, FNP  Cardiologist:  Carlyle Dolly, MD Electrophysiologist:  None   Chief Complaint: Atrial flutter, status post hospitalization  History of Present Illness:  Holly Flores is a 74 y.o. female with a history of atrial flutter, OSA, chest pain, mixed hyperlipidemia, essential hypertension, CHF, COPD, CVA, CKD, HLD, HTN, hypothyroidism, sleep apnea.  Last encounter with Dr. Harl Bowie 10/30/2019.  Having chronic chest pains  unchanged.  History of CVA 2019.  Having rare infrequent palpitations related to her paroxysmal atrial flutter.  Had run out of blood pressure medicines at last visit.  LDL was 106 in August 2020.  Labs were being followed by PCP.  Not using CPAP.  Fractured her ankle in June 2021 managed without surgery.  Blood pressure medication was refilled.  Chronic stable anginal-like symptoms with long history of prior negative ischemic evaluations.  Continuing to monitor.  Prior history of OSA had not been wearing CPAP in several years.  Referral was made to pulmonary.  Atrial flutter was controlled, no significant symptoms and continuing current medications.  She had a recent presentation for complaints of shortness of breath and chest pain.  She was admitted on 12/29/2019.  She was described as having new onset progressively worsening peripheral edema in bilateral lower extremities, abdomen, right upper extremity for the prior couple of weeks.  She began having shortness of breath and chest pain the day before.  Chest pain was described as a pressure-like sensation located centrally.  Symptoms were occurring at rest.  Hospitals course described as having anasarca with fluid overload likely secondary to heart failure improved with diuresis.  She was started on Lasix 40 mg IV daily.  She is here today for hospital follow-up.  It appears she has lost  approximately 11 pounds since November 16.  At that time she weighed 241 pounds.  Today she weighs 230 pounds.  States she is breathing much better and having no shortness of breath.  States her arms and both legs were swollen prior to admission.  Denies any significant lower extremity edema.  Denies any anginal symptoms, palpitations or arrhythmias, orthostatic symptoms, CVA or TIA-like symptoms.  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  She is not very active on a daily basis.  She wears O2 at night.  She currently does not have a CPAP machine but will soon have a sleep consult with pulmonary.   Past Medical History:  Diagnosis Date  . Anxiety   . Arthritis    "all over" (12/20/2017)  . Central retinal vein occlusion of left eye 01/22/2015  . CHF (congestive heart failure) ([place])   . Chronic bronchitis ([place])    "get it most years" (12/20/2017)  . Chronic lower back pain   . CKD (chronic kidney disease), stage II   . COPD (chronic obstructive pulmonary disease) ([place])   . CVA (cerebral vascular accident) ([place]) 12/30/2014   OCULAR  LEFT  EYE   . Depression   . Fibromyalgia   . GERD (gastroesophageal reflux disease)   . Goiter   . History of colonic polyps 05/14/2014  . Hyperlipidemia   . Hypertension   . Hypothyroidism   . Kidney cysts    left side  . 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 Migraine    "couple times/wk; been having them for  a long time" (12/20/2017)  . Myocardial infarction [facility])    "one dr said I did; one said I didn't;  don't remember when it was" (12/20/2017)  . 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 On home oxygen therapy    "3L; sleep w/it q night" (12/20/2017)  . Pneumonia    "several times" (12/20/2017)  . Shortness of breath   . Sleep apnea    "stopped mask when I got on the oxygen" (12/20/2017)  . Stroke [facility])     Past Surgical History:  Procedure Laterality Date  . ANTERIOR CERVICAL DECOMP/DISCECTOMY  FUSION    . BACK SURGERY    . BREAST BIOPSY     left-non cancerous  . CARDIAC CATHETERIZATION    . CARPAL TUNNEL RELEASE Left   . CATARACT EXTRACTION W/PHACO  09/21/2011   Procedure: CATARACT EXTRACTION PHACO AND INTRAOCULAR LENS PLACEMENT (IOC);  Surgeon: Williams Che, MD;  Location: [facility];  Service: Ophthalmology;  Laterality: Left;  CDE:9.78  . CATARACT EXTRACTION W/PHACO Right 10/15/2014   Procedure: CATARACT EXTRACTION PHACO AND INTRAOCULAR LENS PLACEMENT ([place]);  Surgeon: Williams Che, MD;  Location: [facility];  Service: Ophthalmology;  Laterality: Right;  CDE:4.19  . [place]; 1972  . CHOLECYSTECTOMY OPEN    . COLONOSCOPY  2006   RMR: 1. Internal hemorroids, otherwise normal rectum. 2. Pedunculated polyp at 35 cm. reomved with snare. The remainder of teh colonic mucosa appeared normal.   . COLONOSCOPY  2011   Dr. Gala Romney: multiple ascending colon polyps and rectal polyp, adenomatous  . COLONOSCOPY N/A 05/31/2014   Procedure: COLONOSCOPY;  Surgeon: Daneil Dolin, MD;  Location: [facility];  Service: Endoscopy;  Laterality: N/A;  130  . COLONOSCOPY WITH PROPOFOL N/A 06/24/2017   Procedure: COLONOSCOPY WITH PROPOFOL;  Surgeon: Daneil Dolin, MD;  Location: [facility];  Service: Endoscopy;  Laterality: N/A;  2:15pm  . ESOPHAGOGASTRODUODENOSCOPY  2006   Dr. Gala Romney: Subtle Schatzkis ring, otherwise normal upper GI tract, aside from a small pyloric channell erosion, status post dilation as described above.   Marland Kitchen FLEXIBLE SIGMOIDOSCOPY N/A 11/11/2017   Procedure: FLEXIBLE SIGMOIDOSCOPY;  Surgeon: Otis Brace, MD;  Location: [place];  Service: Gastroenterology;  Laterality: N/A;  . JOINT REPLACEMENT    . LUMBAR DISC SURGERY  X 2  . POLYPECTOMY  06/24/2017   Procedure: POLYPECTOMY;  Surgeon: Daneil Dolin, MD;  Location: [facility];  Service: Endoscopy;;  ascending  . POSTERIOR LUMBAR FUSION  ? date 1st fusion ; 07/2006   previous L4-5 Ray threaded fusion cage;  Exploration of L4-5 fusion & PLIF 07/22/2006/notes 07/22/2006  . TONSILLECTOMY    . TOTAL KNEE ARTHROPLASTY Right 05/24/2013   Procedure: TOTAL KNEE ARTHROPLASTY;  Surgeon: [name], MD;  Location: [place];  Service: Orthopedics;  Laterality: Right;  . TOTAL KNEE ARTHROPLASTY Left   . VAGINAL HYSTERECTOMY      Current Outpatient Medications  Medication Sig Dispense Refill  . albuterol (PROVENTIL HFA;VENTOLIN HFA) 108 (90 Base) MCG/ACT inhaler Inhale 2 puffs into the lungs every 6 (six) hours as needed (for wheezing/shortness of breath). (Patient taking differently: Inhale 2 puffs into the lungs every 4 (four) hours as needed (for wheezing/shortness of breath). ) 1 Inhaler 1  . apixaban (ELIQUIS) 5 MG TABS tablet Take 1 tablet (5 mg total) by mouth 2 (two) times daily. 180 tablet 3  . atorvastatin (LIPITOR) 80 MG tablet Take 1 tablet (80 mg total) by mouth daily. (Patient taking  differently: Take 80 mg by mouth every evening. ) 90 tablet 3  . cyclobenzaprine (FLEXERIL) 5 MG tablet Take 1 tablet (5 mg total) by mouth at bedtime. 30 tablet 0  . hydroxypropyl methylcellulose / hypromellose (ISOPTO TEARS / GONIOVISC) 2.5 % ophthalmic solution Place 1 drop into both eyes in the morning and at bedtime.     . metoprolol succinate (TOPROL-XL) 25 MG 24 hr tablet Take 1 tablet (25 mg total) by mouth daily. 90 tablet 3  . potassium chloride SA (K-DUR,KLOR-CON) 20 MEQ tablet Take 1 tablet (20 mEq total) by mouth daily. 30 tablet 1  . torsemide (DEMADEX) 20 MG tablet Take 1 tablet (20 mg total) by mouth daily. 30 tablet 2   No current facility-administered medications for this visit.   Allergies:  Penicillins and Sulfa antibiotics   Social History: The patient  reports that she quit smoking about 32 years ago. Her smoking use included cigarettes. She has a 5.00 pack-year smoking history. She has never used smokeless tobacco. She reports previous alcohol use. She reports that she does not use drugs.    Family History: The patient's family history includes Alcoholism in an other family member; Asthma in an other family member; Cancer in an other family member; Diabetes in an other family member; Heart disease in an other family member; Hypercholesterolemia in an other family member; Hypertension in an other family member; Rheumatologic disease in an other family member; Stroke in an other family member; Thyroid disease in an other family member.   ROS:  Please see the history of present illness. Otherwise, complete review of systems is positive for none.  All other systems are reviewed and negative.   Physical Exam: VS:  BP 128/82   Pulse 82   Ht 5\' 1"  (1.549 m)   Wt 230 lb (104.3 kg) Comment: last week in hospital  SpO2 96%   BMI 43.46 kg/m , BMI Body mass index is 43.46 kg/m.  Wt Readings from Last 3 Encounters:  01/15/20 230 lb (104.3 kg)  01/02/20 241 lb 10 oz (109.6 kg)  10/20/18 185 lb (83.9 kg)    General: Morbidly obese patient appears comfortable at rest. Neck: Supple, no elevated JVP or carotid bruits, no thyromegaly. Lungs: Clear to auscultation, nonlabored breathing at rest. Cardiac: Regular rate and rhythm, no S3 or significant systolic murmur, no pericardial rub. Extremities: No pitting edema, distal pulses 2+. Skin: Warm and dry. Musculoskeletal: No kyphosis. Neuropsychiatric: Alert and oriented x3, affect grossly appropriate.  ECG:  Recent EKG 12/29/2019 sinus rhythm with first-degree AV block rate of 92 with premature supraventricular complexes, left axis deviation.  Recent Labwork: 12/29/2019: ALT 11; AST 19; B Natriuretic Peptide 72.0; Hemoglobin 12.2; Platelets 248 01/01/2020: BUN 17; Creatinine, Ser 1.60; Potassium 3.5; Sodium 136     Component Value Date/Time   CHOL 163 10/02/2018 0414   TRIG 59 10/02/2018 0414   HDL 45 10/02/2018 0414   CHOLHDL 3.6 10/02/2018 0414   VLDL 12 10/02/2018 0414   LDLCALC 106 (H) 10/02/2018 0414    Other Studies  Reviewed Today:  Echocardiogram 12/30/2019 1. Left ventricular ejection fraction, by estimation, is 65 to 70%. The left ventricle has normal function. The left ventricle has no regional wall motion abnormalities. There is moderate left ventricular hypertrophy. Left ventricular diastolic parameters are consistent with Grade I diastolic dysfunction (impaired relaxation). 2. Right ventricular systolic function is normal. The right ventricular size is normal. 3. The mitral valve is normal in structure. No evidence of mitral  valve regurgitation. No evidence of mitral stenosis. 4. The aortic valve is normal in structure. Aortic valve regurgitation is not visualized. No aortic stenosis is present. 5. Aortic dilatation noted. There is mild dilatation of the aortic root, measuring 40 mm. 6. The inferior vena cava is normal in size with greater than 50% respiratory variability, suggesting right atrial pressure of 3 mmHg.    10/2011 cath HEMODYNAMICS: Aortic systolic pressure: 503.  Aortic diastolic pressure: 78.  Left ventricular systolic pressure: 546.  Left ventricular diastolic pressure: 18.  CORONARY ANGIOGRAPHY:  1. Left main: The left main normal.  2. LAD: The LAD had a 56%-81% mid systolic bridge after a diagonal branch.  3. Left circumflex coronary artery: Free of significant disease.  4. Right coronary artery: This a large dominant vessel which was free of  significant disease.  LEFT VENTRICULOGRAPHY: The RAO left ventriculogram was performed using 25  cc of Omnipaque dye at 12 cc per second. The overall LVEF was estimated at  greater than 60% without focal wall motion abnormality.  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 I suspect that her chest  pain is related to cholelithiasis.  The sheath was removed and pressure held on  the groin to achieve hemostasis.  The patient left the cath lab in stable condition.  DISPOSITION: She will be discharged home today to be followed as an  outpatient. She will see me back in the office in several weeks. She is  cleared to undergo an elective cholecystectomy, at a low cardiovascular  risk. She left the laboratory in stable condition. Dr. Octavio Graves was  notified of these results.   12/2014 echo ------------------------------------------------------------------- Study Conclusions  - Left ventricle: The cavity size was normal. Wall thickness was normal. Systolic function was vigorous. The estimated ejection fraction was in the range of 65% to 70%. Doppler parameters are consistent with abnormal left ventricular relaxation (grade 1 diastolic dysfunction). - Left atrium: The atrium was mildly dilated.   06/2017 echo Study Conclusions  - Left ventricle: The cavity size was normal. Wall thickness was increased in a pattern of mild LVH. Systolic function was normal. The estimated ejection fraction was in the range of 60% to 65%. Wall motion was normal; there were no regional wall motion abnormalities. Doppler parameters are consistent with abnormal left ventricular relaxation (grade 1 diastolic dysfunction). - Mitral valve: Mildly calcified annulus. There was trivial regurgitation. - Right atrium: Central venous pressure (est): 3 mm Hg. - Atrial septum: No defect or patent foramen ovale was identified. - Tricuspid valve: There was trivial regurgitation. - Pulmonary arteries: PA peak pressure: 7 mm Hg (S). - Pericardium, extracardiac: There was no pericardial effusion.    Assessment and Plan:  1. Diastolic congestive heart failure, unspecified HF chronicity ([place])   2. Essential hypertension   3. Mixed hyperlipidemia   4. OSA (obstructive sleep apnea)   5. Atrial flutter, unspecified type ([place])     1.  Diastolic congestive heart  failure Recent admission for diastolic heart failure with anasarca.  Patient was diuresed with IV Lasix.  She lost approximately 11 pounds.  States she is feeling much better and no shortness of breath.  States lower extremity edema has resolved as well as upper extremity edema.  Continue torsemide 20 mg p.o. daily.  Continue Toprol-XL 25 mg p.o. daily.  Advised to continue to weigh daily  and call if increase of 3 pounds in 24 hours or 5 pounds in a week.  Watch fluid intake.  No more than 1.5 to 2 L/day of fluid intake.  Restrict salt intake.  Patient verbalizes understanding of the importance of these restrictions.  2. Essential hypertension Blood pressure is well controlled today at 128/82.  Continue Toprol-XL 25 mg p.o. daily.  3. Chest pain, unspecified type Denies any recent chest pain.  Recent admission for shortness of breath and chest pain.  Troponins were negative x2.  D-dimer was 0.69.  Chest x-ray was negative for active cardiopulmonary disease.  Cardiomegaly was present  4. Mixed hyperlipidemia Continue atorvastatin 80 mg p.o. daily.  5. OSA (obstructive sleep apnea) She is in the process of having another sleep consult and possible sleep study.  She states her CPAP machine is broken.  She has an appointment upcoming in December on the 10th at 1130 with Dr. Halford Chessman pulmonology.  6. Atrial flutter, unspecified type ([place]) Denies any palpitations or arrhythmias.  Rhythm is regular today with a rate of 82.  Continue Eliquis 5 mg p.o. twice daily.   Medication Adjustments/Labs and Tests Ordered: Current medicines are reviewed at length with the patient today.  Concerns regarding medicines are outlined above.   Disposition: Follow-up with Dr. Harl Bowie or APP 6 months  Signed, Levell July, NP 01/15/2020 11:41 AM    [facility] [place], [place], [place] [address] Phone: [phone]; Fax: [phone]

## 2020-01-15 ENCOUNTER — Ambulatory Visit (INDEPENDENT_AMBULATORY_CARE_PROVIDER_SITE_OTHER): Payer: Medicare Other | Admitting: Family Medicine

## 2020-01-15 ENCOUNTER — Other Ambulatory Visit: Payer: Self-pay

## 2020-01-15 ENCOUNTER — Encounter: Payer: Self-pay | Admitting: Family Medicine

## 2020-01-15 VITALS — BP 128/82 | HR 82 | Ht 61.0 in | Wt 230.0 lb

## 2020-01-15 DIAGNOSIS — G4733 Obstructive sleep apnea (adult) (pediatric): Secondary | ICD-10-CM | POA: Diagnosis not present

## 2020-01-15 DIAGNOSIS — I5030 Unspecified diastolic (congestive) heart failure: Secondary | ICD-10-CM

## 2020-01-15 DIAGNOSIS — E782 Mixed hyperlipidemia: Secondary | ICD-10-CM

## 2020-01-15 DIAGNOSIS — I4892 Unspecified atrial flutter: Secondary | ICD-10-CM

## 2020-01-15 DIAGNOSIS — I10 Essential (primary) hypertension: Secondary | ICD-10-CM

## 2020-01-15 NOTE — Patient Instructions (Signed)
Medication Instructions:  Continue all current medications.   Labwork: none  Testing/Procedures: none  Follow-Up: 6 months   Any Other Special Instructions Will Be Listed Below (If Applicable).   If you need a refill on your cardiac medications before your next appointment, please call your pharmacy.  

## 2020-01-26 ENCOUNTER — Encounter: Payer: Self-pay | Admitting: Pulmonary Disease

## 2020-01-26 ENCOUNTER — Other Ambulatory Visit: Payer: Self-pay

## 2020-01-26 ENCOUNTER — Ambulatory Visit (INDEPENDENT_AMBULATORY_CARE_PROVIDER_SITE_OTHER): Payer: Medicare Other | Admitting: Pulmonary Disease

## 2020-01-26 VITALS — BP 136/78 | HR 71 | Temp 97.0°F | Ht 61.0 in

## 2020-01-26 DIAGNOSIS — E662 Morbid (severe) obesity with alveolar hypoventilation: Secondary | ICD-10-CM

## 2020-01-26 DIAGNOSIS — G4734 Idiopathic sleep related nonobstructive alveolar hypoventilation: Secondary | ICD-10-CM | POA: Diagnosis not present

## 2020-01-26 DIAGNOSIS — G4733 Obstructive sleep apnea (adult) (pediatric): Secondary | ICD-10-CM | POA: Diagnosis not present

## 2020-01-26 NOTE — Progress Notes (Signed)
Pulmonary, Critical Care, and Sleep Medicine  Chief Complaint  Patient presents with  . Consult    Sleep consult    Constitutional:  BP 136/78 (BP Location: Left Arm, Cuff Size: Normal)   Pulse 71   Temp (!) 97 F (36.1 C) (Other (Comment)) Comment (Src): wrist  Ht 5\' 1"  (1.549 m)   SpO2 98% Comment: Room air  BMI 43.46 kg/m   Past Medical History:  Anxiety, OA, CHF, Back pain, CKD, CVA with Rt sided weakness, Depression, Fibromyalgia, GERD, Goiter, Colon polyp, HLD, HTN, Hypothyroidism, Renal cyst, Migraine HA, Ogilvie's syndrome, Pneumonia, A flutter  Past Surgical History:  Her  has a past surgical history that includes Tonsillectomy; Cesarean section (1969; 1972); Breast biopsy; Cataract extraction w/PHACO (09/21/2011); Total knee arthroplasty (Right, 05/24/2013); Esophagogastroduodenoscopy (2006); Colonoscopy (2006); Colonoscopy (2011); Colonoscopy (N/A, 05/31/2014); Cataract extraction w/PHACO (Right, 10/15/2014); Carpal tunnel release (Left); Colonoscopy with propofol (N/A, 06/24/2017); polypectomy (06/24/2017); Flexible sigmoidoscopy (N/A, 11/11/2017); Cholecystectomy open; Total knee arthroplasty (Left); Joint replacement; Back surgery; Anterior cervical decomp/discectomy fusion; Lumbar disc surgery (X 2); Posterior lumbar fusion (? date 1st fusion ; 07/2006); Cardiac catheterization; and Vaginal hysterectomy.  Brief Summary:  Holly Flores is a 74 y.o. female former smoker with sleep disordered breathing.      Subjective:   She is here with her son.  She had a sleep study several years ago at [facility].  She was started on CPAP and getting supplies through [employer].  Her CPAP broke.  She was also set up with oxygen through [place].  She has been using oxygen at night.  Her son is concerned that she is having trouble with her breathing at night.  She has trouble falling asleep and staying asleep.  She used her husbands trazodone before and this  helped, but never had this prescribed for herself.  She has been using melatonin, but this doesn't help.    She goes to bed at 10 pm.  She takes a while to fall asleep.  She then wakes up after 2 hours feeling choked and like she can't breath.  She gets up at 8 am.  She feels tired in the morning.  She drinks a cup of coffee in the morning.    She denies sleep walking, sleep talking, bruxism, or nightmares.  There is no history of restless legs.  She denies sleep hallucinations, sleep paralysis, or cataplexy.  The Epworth score is 10 out of 24.    Physical Exam:   Appearance - well kempt, sitting in wheelchair, weak on Rt side  ENMT - no sinus tenderness, no oral exudate, no LAN, Mallampati 4 airway, no stridor, wears dentures  Respiratory - equal breath sounds bilaterally, no wheezing or rales  CV - s1s2 regular rate and rhythm, no murmurs  Ext - no clubbing, no edema  Skin - no rashes  Psych - normal mood and affect   Sleep Tests:    Cardiac Tests:   Echo 12/30/19 >> EF 65 to 70%, grade 1 DD, mod LVH, aortic root 40 mm  Social History:  She  reports that she quit smoking about 32 years ago. Her smoking use included cigarettes. She has a 5.00 pack-year smoking history. She has never used smokeless tobacco. She reports previous alcohol use. She reports that she does not use drugs.  Family History:  Her family history includes Alcoholism in an other family member; Asthma in an other family member; Cancer in an other family member; Diabetes in  an other family member; Heart disease in an other family member; Hypercholesterolemia in an other family member; Hypertension in an other family member; Rheumatologic disease in an other family member; Stroke in an other family member; Thyroid disease in an other family member.    Discussion:  She has snoring, sleep disruption, apnea and daytime sleepiness.  She has history of CVA, A flutter and hypertension.  She has been using  supplemental oxygen at night.  She has history of obstructive sleep apnea and obesity hypoventilation syndrome.  Her BMI is 43.46.  She also reports symptoms of insomnia.    Assessment/Plan:   Sleep disordered breathing. - will need to repeat sleep study to assess current status - given that she has sleep apnea (obstructive and/or central), hypoxia and hypoventilation it is my medical opinion that she needs to have an in lab monitored sleep study and home sleep study would not be sufficient to adequately assess her sleep disordered breathing - will then determine if she should resume CPAP with oxygen or if she might need to be assessed for Bipap therapy  Chronic diastolic CHF, Atrial flutter. - she is followed by Dr. Harl Bowie with [facility]  Hx of CVA with Rt side weakness. - she needs assistance with mobility and transfer, and uses a wheelchair  Obesity. - discussed how weight can impact sleep and risk for sleep disordered breathing - discussed options to assist with weight loss: combination of diet modification, cardiovascular and strength training exercises  Cardiovascular risk. - had an extensive discussion regarding the adverse health consequences related to untreated sleep disordered breathing - specifically discussed the risks for hypertension, coronary artery disease, cardiac dysrhythmias, cerebrovascular disease, and diabetes - lifestyle modification discussed  Safe driving practices. - discussed how sleep disruption can increase risk of accidents, particularly when driving - safe driving practices were discussed  Therapies for obstructive sleep apnea. - if the sleep study shows significant sleep apnea, then various therapies for treatment were reviewed: CPAP, oral appliance, and surgical interventions  Time Spent Involved in Patient Care on Day of Examination:  34 minutes  Follow up:  Patient Instructions  Will arrange for in lab sleep study Will call to arrange for  follow up after sleep study reviewed    Medication List:   Allergies as of 01/26/2020      Reactions   Penicillins Hives, Itching, Rash   Has patient had a PCN reaction causing immediate rash, facial/tongue/throat swelling, SOB or lightheadedness with hypotension: Yes Has patient had a PCN reaction causing severe rash involving mucus membranes or skin necrosis: Yes Has patient had a PCN reaction that required hospitalization: No Has patient had a PCN reaction occurring within the last 10 years: No If all of the above answers are "NO", then may proceed with Cephalosporin use.   Sulfa Antibiotics Swelling   Tongue swelling      Medication List       Accurate as of January 26, 2020 11:58 AM. If you have any questions, ask your nurse or doctor.        albuterol 108 (90 Base) MCG/ACT inhaler Commonly known as: VENTOLIN HFA Inhale 2 puffs into the lungs every 6 (six) hours as needed (for wheezing/shortness of breath). What changed: when to take this   apixaban 5 MG Tabs tablet Commonly known as: Eliquis Take 1 tablet (5 mg total) by mouth 2 (two) times daily.   atorvastatin 80 MG tablet Commonly known as: LIPITOR Take 1 tablet (80 mg total)  by mouth daily. What changed: when to take this   cyclobenzaprine 5 MG tablet Commonly known as: FLEXERIL Take 1 tablet (5 mg total) by mouth at bedtime.   hydroxypropyl methylcellulose / hypromellose 2.5 % ophthalmic solution Commonly known as: ISOPTO TEARS / GONIOVISC Place 1 drop into both eyes in the morning and at bedtime.   metoprolol succinate 25 MG 24 hr tablet Commonly known as: TOPROL-XL Take 1 tablet (25 mg total) by mouth daily.   potassium chloride SA 20 MEQ tablet Commonly known as: KLOR-CON Take 1 tablet (20 mEq total) by mouth daily.   torsemide 20 MG tablet Commonly known as: DEMADEX Take 1 tablet (20 mg total) by mouth daily.       Signature:  Chesley Mires, MD [place] Pager -  [phone] 01/26/2020, 11:58 AM

## 2020-01-26 NOTE — Patient Instructions (Signed)
Will arrange for in lab sleep study Will call to arrange for follow up after sleep study reviewed  

## 2020-04-06 ENCOUNTER — Emergency Department (HOSPITAL_COMMUNITY): Payer: Medicare Other

## 2020-04-06 ENCOUNTER — Emergency Department (HOSPITAL_COMMUNITY)
Admission: EM | Admit: 2020-04-06 | Discharge: 2020-04-07 | Disposition: A | Discharge status: Home / Self Care | Payer: Medicare Other | Attending: Emergency Medicine | Admitting: Emergency Medicine

## 2020-04-06 ENCOUNTER — Encounter (HOSPITAL_COMMUNITY): Payer: Self-pay | Admitting: Emergency Medicine

## 2020-04-06 ENCOUNTER — Other Ambulatory Visit: Payer: Self-pay

## 2020-04-06 DIAGNOSIS — I130 Hypertensive heart and chronic kidney disease with heart failure and stage 1 through stage 4 chronic kidney disease, or unspecified chronic kidney disease: Secondary | ICD-10-CM | POA: Diagnosis not present

## 2020-04-06 DIAGNOSIS — Z96653 Presence of artificial knee joint, bilateral: Secondary | ICD-10-CM | POA: Insufficient documentation

## 2020-04-06 DIAGNOSIS — R140 Abdominal distension (gaseous): Secondary | ICD-10-CM

## 2020-04-06 DIAGNOSIS — K5900 Constipation, unspecified: Secondary | ICD-10-CM | POA: Diagnosis not present

## 2020-04-06 DIAGNOSIS — E039 Hypothyroidism, unspecified: Secondary | ICD-10-CM | POA: Insufficient documentation

## 2020-04-06 DIAGNOSIS — R109 Unspecified abdominal pain: Secondary | ICD-10-CM | POA: Diagnosis present

## 2020-04-06 DIAGNOSIS — R0602 Shortness of breath: Secondary | ICD-10-CM | POA: Diagnosis not present

## 2020-04-06 DIAGNOSIS — Z87891 Personal history of nicotine dependence: Secondary | ICD-10-CM | POA: Diagnosis not present

## 2020-04-06 DIAGNOSIS — Z7901 Long term (current) use of anticoagulants: Secondary | ICD-10-CM | POA: Insufficient documentation

## 2020-04-06 DIAGNOSIS — K219 Gastro-esophageal reflux disease without esophagitis: Secondary | ICD-10-CM | POA: Insufficient documentation

## 2020-04-06 DIAGNOSIS — I5033 Acute on chronic diastolic (congestive) heart failure: Secondary | ICD-10-CM | POA: Insufficient documentation

## 2020-04-06 DIAGNOSIS — N183 Chronic kidney disease, stage 3 unspecified: Secondary | ICD-10-CM | POA: Diagnosis not present

## 2020-04-06 DIAGNOSIS — J449 Chronic obstructive pulmonary disease, unspecified: Secondary | ICD-10-CM | POA: Insufficient documentation

## 2020-04-06 DIAGNOSIS — Z79899 Other long term (current) drug therapy: Secondary | ICD-10-CM | POA: Insufficient documentation

## 2020-04-06 LAB — BASIC METABOLIC PANEL
Anion gap: 8 (ref 5–15)
BUN: 8 mg/dL (ref 8–23)
CO2: 31 mmol/L (ref 22–32)
Calcium: 8.9 mg/dL (ref 8.9–10.3)
Chloride: 99 mmol/L (ref 98–111)
Creatinine, Ser: 1.56 mg/dL — ABNORMAL HIGH (ref 0.44–1.00)
GFR, Estimated: 35 mL/min — ABNORMAL LOW (ref >60)
Glucose, Bld: 125 mg/dL — ABNORMAL HIGH (ref 70–99)
Potassium: 4 mmol/L (ref 3.5–5.1)
Sodium: 138 mmol/L (ref 135–145)

## 2020-04-06 LAB — HEPATIC FUNCTION PANEL
ALT: 28 U/L (ref 0–44)
AST: 27 U/L (ref 15–41)
Albumin: 3.4 g/dL — ABNORMAL LOW (ref 3.5–5.0)
Alkaline Phosphatase: 141 U/L — ABNORMAL HIGH (ref 38–126)
Bilirubin, Direct: <0.1 mg/dL (ref 0.0–0.2)
Total Bilirubin: 0.9 mg/dL (ref 0.3–1.2)
Total Protein: 7.7 g/dL (ref 6.5–8.1)

## 2020-04-06 LAB — CBC
HCT: 45.7 % (ref 36.0–46.0)
Hemoglobin: 14.1 g/dL (ref 12.0–15.0)
MCH: 30.1 pg (ref 26.0–34.0)
MCHC: 30.9 g/dL (ref 30.0–36.0)
MCV: 97.6 fL (ref 80.0–100.0)
Platelets: 282 10*3/uL (ref 150–400)
RBC: 4.68 MIL/uL (ref 3.87–5.11)
RDW: 13.6 % (ref 11.5–15.5)
WBC: 9.1 10*3/uL (ref 4.0–10.5)
nRBC: 0 % (ref 0.0–0.2)

## 2020-04-06 LAB — TROPONIN I (HIGH SENSITIVITY)
Troponin I (High Sensitivity): 11 ng/L (ref <18)
Troponin I (High Sensitivity): 10 ng/L (ref <18)

## 2020-04-06 LAB — LIPASE, BLOOD: Lipase: 25 U/L (ref 11–51)

## 2020-04-06 MED ORDER — DOCUSATE SODIUM 100 MG PO CAPS: 100.0000 mg | ORAL_CAPSULE | Freq: Two times a day (BID) | ORAL | 0 refills | Status: AC

## 2020-04-06 MED ORDER — IOHEXOL 300 MG/ML  SOLN
80.0000 mL | Freq: Once | INTRAMUSCULAR | Status: AC | PRN
  Administered 2020-04-06: 80 mL via INTRAVENOUS

## 2020-04-06 MED ORDER — ONDANSETRON 4 MG PO TBDP: 4.0000 mg | ORAL_TABLET | Freq: Three times a day (TID) | ORAL | 0 refills | Status: AC | PRN

## 2020-04-06 MED ORDER — POLYETHYLENE GLYCOL 3350 17 G PO PACK: 17.0000 g | PACK | Freq: Two times a day (BID) | ORAL | 0 refills | Status: AC

## 2020-04-06 MED ORDER — ONDANSETRON HCL 4 MG/2ML IJ SOLN
4.0000 mg | Freq: Once | INTRAMUSCULAR | Status: AC
  Administered 2020-04-06: 4 mg via INTRAVENOUS
  Filled 2020-04-06: qty 2

## 2020-04-06 MED ORDER — HYDROMORPHONE HCL 1 MG/ML IJ SOLN
0.5000 mg | Freq: Once | INTRAMUSCULAR | Status: AC
  Administered 2020-04-06: 0.5 mg via INTRAVENOUS
  Filled 2020-04-06: qty 1

## 2020-04-06 NOTE — ED Provider Notes (Signed)
[facility] EMERGENCY DEPARTMENT Provider Note   CSN: [id number] Arrival date & time: 04/06/20  1153     History Chief Complaint  Patient presents with  . Chest Pain  . Abdominal Pain    Holly Flores is a 75 y.o. female.  HPI 75 year old female presents with abdominal pain.  History is from patient and family.  Patient has had abdominal pain for the last 2 weeks, especially after eating.  Now it is much worse.  Seem to get worse last night and she had some vomiting.  No hematemesis.  Originally was having diarrhea but now no bowel movements for the last 3 or 4 days.  No fevers during this time.  Had some transient chest pain or shortness of breath today though that is improved.  No leg swelling like previous heart failure.  Her abdomen is distended. Is still passing gas.   Past Medical History:  Diagnosis Date  . Anxiety   . Arthritis    "all over" (12/20/2017)  . Atrial flutter ([place])   . Central retinal vein occlusion of left eye 01/22/2015  . Chronic lower back pain   . CKD (chronic kidney disease), stage II   . CVA (cerebral vascular accident) ([place]) 12/30/2014   OCULAR  LEFT  EYE   . Depression   . Diastolic CHF ([place])   . Fibromyalgia   . GERD (gastroesophageal reflux disease)   . Goiter   . History of colonic polyps 05/14/2014  . Hyperlipidemia   . Hypertension   . Hypothyroidism   . Kidney cysts    left side  . 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 Migraine    "couple times/wk; been having them for a long time" (12/20/2017)  . Myocardial infarction [facility])    "one dr said I did; one said I didn't;  don't remember when it was" (12/20/2017)  . 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 On home oxygen therapy    "3L; sleep w/it q night" (12/20/2017)  . Pneumonia    "several times" (12/20/2017)  . Sleep apnea    "stopped mask when I got on the oxygen" (12/20/2017)    Patient Active Problem List   Diagnosis Date  Noted  . Acute on chronic diastolic CHF (congestive heart failure) ([place]) 12/30/2019  . CHF (congestive heart failure) ([place]) 12/30/2019  . Anasarca 12/29/2019  . Acute CVA (cerebrovascular accident) ([place]) 10/01/2018  . Physical deconditioning 12/19/2017  . Chronic kidney disease (CKD), stage III (moderate) ([facility])   . Hypoalbuminemia due to protein-calorie malnutrition ([place])   . Intractable migraine without status migrainosus   . Reactive depression   . Hemiparesis affecting right side as late effect of stroke ([place])   . Slow transit constipation   . PAF (paroxysmal atrial fibrillation) ([place])   . Dysphasia, post-stroke   . COPD (chronic obstructive pulmonary disease) ([place]) 10/29/2017  . Chronic diastolic CHF (congestive heart failure) ([place]) 10/29/2017  . Hypothyroidism 10/29/2017  . Essential hypertension 10/29/2017  . GERD (gastroesophageal reflux disease) 10/29/2017  . Anxiety 10/29/2017  . 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 Hyperlipidemia 04/20/2013    Past Surgical History:  Procedure Laterality Date  . ANTERIOR CERVICAL DECOMP/DISCECTOMY FUSION    . BACK SURGERY    . BREAST BIOPSY     left-non cancerous  . CARDIAC CATHETERIZATION    . CARPAL TUNNEL RELEASE Left   . CATARACT EXTRACTION W/PHACO  09/21/2011   Procedure:  CATARACT EXTRACTION PHACO AND INTRAOCULAR LENS PLACEMENT (IOC);  Surgeon: Williams Che, MD;  Location: [facility];  Service: Ophthalmology;  Laterality: Left;  CDE:9.78  . CATARACT EXTRACTION W/PHACO Right 10/15/2014   Procedure: CATARACT EXTRACTION PHACO AND INTRAOCULAR LENS PLACEMENT ([place]);  Surgeon: Williams Che, MD;  Location: [facility];  Service: Ophthalmology;  Laterality: Right;  CDE:4.19  . [place]; 1972  . CHOLECYSTECTOMY OPEN    . COLONOSCOPY  2006   RMR: 1. Internal hemorroids, otherwise normal rectum. 2. Pedunculated polyp at 35 cm. reomved with snare. The remainder of teh colonic mucosa appeared normal.   .  COLONOSCOPY  2011   Dr. Gala Romney: multiple ascending colon polyps and rectal polyp, adenomatous  . COLONOSCOPY N/A 05/31/2014   Procedure: COLONOSCOPY;  Surgeon: Daneil Dolin, MD;  Location: [facility];  Service: Endoscopy;  Laterality: N/A;  130  . COLONOSCOPY WITH PROPOFOL N/A 06/24/2017   Procedure: COLONOSCOPY WITH PROPOFOL;  Surgeon: Daneil Dolin, MD;  Location[facility];  Service: Endoscopy;  Laterality: N/A;  2:15pm  . ESOPHAGOGASTRODUODENOSCOPY  2006   Dr. Gala Romney: Subtle Schatzkis ring, otherwise normal upper GI tract, aside from a small pyloric channell erosion, status post dilation as described above.   Marland Kitchen FLEXIBLE SIGMOIDOSCOPY N/A 11/11/2017   Procedure: FLEXIBLE SIGMOIDOSCOPY;  Surgeon: Otis Brace, MD;  Location: [place];  Service: Gastroenterology;  Laterality: N/A;  . JOINT REPLACEMENT    . LUMBAR DISC SURGERY  X 2  . POLYPECTOMY  06/24/2017   Procedure: POLYPECTOMY;  Surgeon: Daneil Dolin, MD;  Location: [facility];  Service: Endoscopy;;  ascending  . POSTERIOR LUMBAR FUSION  ? date 1st fusion ; 07/2006   previous L4-5 Ray threaded fusion cage; Exploration of L4-5 fusion & PLIF 07/22/2006/notes 07/22/2006  . TONSILLECTOMY    . TOTAL KNEE ARTHROPLASTY Right 05/24/2013   Procedure: TOTAL KNEE ARTHROPLASTY;  Surgeon: [name], MD;  Location: [place];  Service: Orthopedics;  Laterality: Right;  . TOTAL KNEE ARTHROPLASTY Left   . VAGINAL HYSTERECTOMY       OB History   No obstetric history on file.     Family History  Problem Relation Age of Onset  . Cancer Other   . Diabetes Other   . Heart disease Other   . Hypertension Other   . Asthma Other   . Alcoholism Other   . Thyroid disease Other   . Rheumatologic disease Other   . Stroke Other   . Hypercholesterolemia Other   . Colon cancer Neg Hx     Social History   Tobacco Use  . Smoking status: Former Smoker    Packs/day: 0.50    Years: 10.00    Pack years: 5.00    Types: Cigarettes     Quit date: 04/21/1987    Years since quitting: 32.9  . Smokeless tobacco: Never Used  . Tobacco comment: smokes cigarette if she has a headache which helps.  Vaping Use  . Vaping Use: Never used  Substance Use Topics  . Alcohol use: Not Currently    Comment: previously 2 glasses of wine a day; stopped in 2017 (12/20/2017  . Drug use: Never    Home Medications Prior to Admission medications   Medication Sig Start Date End Date Taking? Authorizing Provider  albuterol (PROVENTIL HFA;VENTOLIN HFA) 108 (90 Base) MCG/ACT inhaler Inhale 2 puffs into the lungs every 6 (six) hours as needed (for wheezing/shortness of breath). Patient taking differently: Inhale 2  puffs into the lungs every 4 (four) hours as needed (for wheezing/shortness of breath). 12/08/17  Yes Angiulli, Lavon Paganini, PA-C  ALPRAZolam Duanne Moron) 1 MG tablet Take 1 mg by mouth 3 (three) times daily.   Yes [provider]  apixaban (ELIQUIS) 5 MG TABS tablet Take 1 tablet (5 mg total) by mouth 2 (two) times daily. 10/30/19  Yes BranchAlphonse Guild, MD  atorvastatin (LIPITOR) 80 MG tablet Take 1 tablet (80 mg total) by mouth daily. Patient taking differently: Take 80 mg by mouth every evening. 10/30/19 01/28/20 Yes Branch, Alphonse Guild, MD  cyclobenzaprine (FLEXERIL) 5 MG tablet Take 1 tablet (5 mg total) by mouth at bedtime. Patient taking differently: Take 5 mg by mouth at bedtime as needed for muscle spasms. 12/08/17  Yes Angiulli, Lavon Paganini, PA-C  diphenhydrAMINE HCl, Sleep, (ZZZQUIL PO) Take 1 tablet by mouth every other day.   Yes [provider]  docusate sodium (COLACE) 100 MG capsule Take 1 capsule (100 mg total) by mouth every 12 (twelve) hours. 04/06/20  Yes Sherwood Gambler, MD  hydroxypropyl methylcellulose / hypromellose (ISOPTO TEARS / GONIOVISC) 2.5 % ophthalmic solution Place 1 drop into both eyes 3 (three) times daily as needed for dry eyes.   Yes [provider]  MELATONIN PO Take 1 tablet by mouth every  other day.   Yes [provider]  metoprolol succinate (TOPROL-XL) 25 MG 24 hr tablet Take 1 tablet (25 mg total) by mouth daily. 10/30/19  Yes Branch, Alphonse Guild, MD  ondansetron (ZOFRAN ODT) 4 MG disintegrating tablet Take 1 tablet (4 mg total) by mouth every 8 (eight) hours as needed for nausea or vomiting. 04/06/20  Yes Sherwood Gambler, MD  polyethylene glycol (MIRALAX / GLYCOLAX) 17 g packet Take 17 g by mouth 2 (two) times daily. 04/06/20  Yes Sherwood Gambler, MD  potassium chloride SA (K-DUR,KLOR-CON) 20 MEQ tablet Take 1 tablet (20 mEq total) by mouth daily. 12/08/17  Yes Angiulli, Lavon Paganini, PA-C  torsemide (DEMADEX) 20 MG tablet Take 1 tablet (20 mg total) by mouth daily. 01/09/20  Yes BranchAlphonse Guild, MD    Allergies    Penicillins and Sulfa antibiotics  Review of Systems   Review of Systems  Constitutional: Negative for fever.  Respiratory: Positive for shortness of breath.   Cardiovascular: Positive for chest pain.  Gastrointestinal: Positive for abdominal distention, abdominal pain, constipation, diarrhea, nausea and vomiting.  All other systems reviewed and are negative.   Physical Exam Updated Vital Signs BP 136/88   Pulse 72   Temp 98.8 F (37.1 C) (Oral)   Resp 17   SpO2 97%   Physical Exam Vitals and nursing note reviewed.  Constitutional:      Appearance: She is well-developed and well-nourished.  HENT:     Head: Normocephalic and atraumatic.     Right Ear: External ear normal.     Left Ear: External ear normal.     Nose: Nose normal.  Eyes:     General:        Right eye: No discharge.        Left eye: No discharge.  Cardiovascular:     Rate and Rhythm: Normal rate and regular rhythm.     Heart sounds: Normal heart sounds.  Pulmonary:     Effort: Pulmonary effort is normal.     Breath sounds: Normal breath sounds.  Abdominal:     General: There is distension.     Palpations: Abdomen is soft.  Tenderness: There is abdominal tenderness  (upper>lower).  Skin:    General: Skin is warm and dry.  Neurological:     Mental Status: She is alert.  Psychiatric:        Mood and Affect: Mood is not anxious.     ED Results / Procedures / Treatments   Labs (all labs ordered are listed, but only abnormal results are displayed) Labs Reviewed  BASIC METABOLIC PANEL - Abnormal; Notable for the following components:      Result Value   Glucose, Bld 125 (*)    Creatinine, Ser 1.56 (*)    GFR, Estimated 35 (*)    All other components within normal limits  HEPATIC FUNCTION PANEL - Abnormal; Notable for the following components:   Albumin 3.4 (*)    Alkaline Phosphatase 141 (*)    All other components within normal limits  CBC  LIPASE, BLOOD  TROPONIN I (HIGH SENSITIVITY)  TROPONIN I (HIGH SENSITIVITY)    EKG EKG Interpretation  Date/Time:  Saturday April 06 2020 12:24:35 EST Ventricular Rate:  95 PR Interval:  230 QRS Duration: 80 QT Interval:  350 QTC Calculation: 439 R Axis:   -49 Text Interpretation: Sinus rhythm with 1st degree A-V block Left anterior fascicular block Possible Anterior infarct , age undetermined T wave abnormality, consider lateral ischemia Abnormal ECG similar to Nov 2021 Confirmed by Sherwood Gambler [phone]) on 04/06/2020 8:28:21 PM   Radiology DG Chest 2 View  Result Date: 04/06/2020 CLINICAL DATA:  75 year old female with chest and abdominal pain. EXAM: CHEST - 2 VIEW COMPARISON:  12/29/2019 FINDINGS: The heart size and mediastinal contours are within normal limits. Bibasilar subsegmental atelectasis. No focal consolidations. No pleural effusion or pneumothorax. Marked gaseous distension of the visualized transverse colon. Cholecystectomy clips in the right upper quadrant. Partially visualized anterior cervical fusion hardware. No acute osseous abnormality. The visualized skeletal structures are unremarkable. IMPRESSION: 1. Bibasilar subsegmental atelectasis. 2. Marked gaseous distension of the  visualized transverse colon. Consider dedicated abdominal radiographs or CT abdomen pelvis as clinically indicated. Electronically Signed   By: Ruthann Cancer MD   On: 04/06/2020 12:48   CT ABDOMEN PELVIS W CONTRAST  Result Date: 04/06/2020 CLINICAL DATA:  Abdominal pain and chest pain x3 weeks with shortness of breath. EXAM: CT ABDOMEN AND PELVIS WITH CONTRAST TECHNIQUE: Multidetector CT imaging of the abdomen and pelvis was performed using the standard protocol following bolus administration of intravenous contrast. CONTRAST:  37mL OMNIPAQUE IOHEXOL 300 MG/ML  SOLN COMPARISON:  November 09, 2017 FINDINGS: Lower chest: Mild atelectasis is seen within the bilateral lung bases. Hepatobiliary: No focal liver abnormality is seen. Status post cholecystectomy. No biliary dilatation. Pancreas: A stable 1.3 cm x 0.9 cm fat density lesion is seen within the body of the pancreas. Spleen: Normal in size without focal abnormality. Adrenals/Urinary Tract: Adrenal glands are unremarkable. Kidneys are normal, without renal calculi or hydronephrosis. A 1.6 cm diameter simple cyst is seen within the lateral aspect of the mid right kidney. The urinary bladder is partially contracted and subsequently limited in evaluation. Stomach/Bowel: Stomach is within normal limits. Appendix appears normal. No evidence of bowel wall thickening, distention, or inflammatory changes. Vascular/Lymphatic: Aortic atherosclerosis. No enlarged abdominal or pelvic lymph nodes. Reproductive: Status post hysterectomy. No adnexal masses. Other: No abdominal wall hernia or abnormality. No abdominopelvic ascites. Musculoskeletal: Bilateral radiopaque pedicle screws are seen within the lower lumbar spine. IMPRESSION: 1. Evidence of prior cholecystectomy and hysterectomy. 2. 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  3. Small right renal cyst. 4. Aortic atherosclerosis. Aortic Atherosclerosis (ICD10-I70.0). Electronically Signed   By: Virgina Norfolk  M.D.   On: 04/06/2020 22:30    Procedures Procedures   Medications Ordered in ED Medications  ondansetron [facility]) injection 4 mg (4 mg Intravenous Given 04/06/20 2059)  HYDROmorphone (DILAUDID) injection 0.5 mg (0.5 mg Intravenous Given 04/06/20 2058)  iohexol (OMNIPAQUE) 300 MG/ML solution 80 mL (80 mLs Intravenous Contrast Given 04/06/20 2201)    ED Course  I have reviewed the triage vital signs and the nursing notes.  Pertinent labs & imaging results that were available during my care of the patient were reviewed by me and considered in my medical decision making (see chart for details).    MDM Rules/Calculators/A&P                          Patient CT shows no new colonic obstruction/dilation.  I discussed with radiology and this appears stable for her and she likely has a chronic component.  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  I discussed with Dr. Fuller Plan, if she does not have megacolon or intractable symptoms, she can be managed with bowel regimen as an outpatient.  She is feeling significantly better and feels well enough for discharge.  Electrolytes are okay including potassium.  Will give MiraLAX, Colace, and discharged with antiemetics.  We discussed return precautions. Final Clinical Impression(s) / ED Diagnoses Final diagnoses:  Abdominal distention  Constipation, unspecified constipation type    Rx / DC Orders ED Discharge Orders         Ordered    polyethylene glycol (MIRALAX / GLYCOLAX) 17 g packet  2 times daily        04/06/20 2335    docusate sodium (COLACE) 100 MG capsule  Every 12 hours        04/06/20 2335    ondansetron (ZOFRAN ODT) 4 MG disintegrating tablet  Every 8 hours PRN        04/06/20 2336           [place]wood Gambler, MD 04/06/20 2340

## 2020-04-06 NOTE — ED Notes (Signed)
Pt endorses abd pain of 8 on a 0 to 10 scale.

## 2020-04-06 NOTE — Discharge Instructions (Signed)
If you develop worsening, continued, or recurrent abdominal pain, uncontrolled vomiting, fever, chest or back pain, or any other new/concerning symptoms then return to the ER for evaluation.  

## 2020-04-06 NOTE — ED Notes (Signed)
SPO2 noted to be 88%, patient reports she wears home O2 PRN at 2L. Patient placed on same. SPO2 increased to 97%. Patient denies further needs at this time.

## 2020-04-06 NOTE — ED Notes (Signed)
ED Provider at bedside. 

## 2020-04-06 NOTE — ED Triage Notes (Signed)
Pt reports generalized abd pain and chest pain x 3 weeks with SOB.  Reports nausea, vomiting, and diarrhea yesterday.

## 2020-04-06 NOTE — ED Notes (Signed)
Assisted patient to bed from wheelchair, 3 person assist. Patient changed into gown and provided with warm blankets. Patient and daughter updated on plan of care and verbalized understanding of same, they deny further needs at this time.

## 2020-05-07 ENCOUNTER — Ambulatory Visit: Payer: Medicare Other | Admitting: Cardiology

## 2020-07-29 ENCOUNTER — Ambulatory Visit: Payer: Medicare Other | Admitting: Cardiology

## 2020-07-29 NOTE — Progress Notes (Deleted)
Clinical Summary Holly Flores is a 75 y.o.female  seen today for follow up of the following medical problems.        1. 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 Repeat cath 10/2001 in setting of chest pain with LM normal, LAD 12-75% mid systolic bridge, LCX patent, RCA patent. LVEF 60% by LVgram.      - chronic chest pains unchanged     2. 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 No acute findings on imaging. Symptoms resolved     3. Paroxysmal aflutter  - rare infrequent palpitations.    4. Chronic diastolic HF - 17/0017 echo LVEF 65-70%, grade I dd, normal RV   5. HTN - ran out of bp meds   6. Hyperlipidemia - 09/2018 LDL 106 - labs followed by pcp   7. Foot/ankle fracture - injury June 2021, managed without surgery   8. OSA - not using cpap, has not been followed in several years  - + daytime somnolence.       SH: husband passed previouslly from pancreatic cancer.  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   "affected my right side"   Migraine    "couple times/wk; been having them for a long time" (12/20/2017)   Myocardial infarction [facility])    "one dr said I did; one said I didn't;  don't remember when it was" (12/20/2017)   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   On home oxygen therapy    "3L; sleep w/it q night" (12/20/2017)    Pneumonia    "several times" (12/20/2017)   Sleep apnea    "stopped mask when I got on the oxygen" (12/20/2017)     Allergies  Allergen Reactions   Penicillins Hives, Itching and Rash    Has patient had a PCN reaction causing immediate rash, facial/tongue/throat swelling, SOB or lightheadedness with hypotension: Yes Has patient had a PCN reaction causing severe rash involving mucus membranes or skin necrosis: Yes Has patient had a PCN reaction that required hospitalization: No Has patient had a PCN reaction occurring within the last 10 years: No If all of the above answers are "NO", then may proceed with Cephalosporin use.    Sulfa Antibiotics Swelling    Tongue swelling     Current Outpatient Medications  Medication Sig Dispense Refill   albuterol (PROVENTIL HFA;VENTOLIN HFA) 108 (90 Base) MCG/ACT inhaler Inhale 2 puffs into the lungs every 6 (six) hours as needed (for wheezing/shortness of breath). (Patient taking differently: Inhale 2 puffs into the lungs every 4 (four) hours as needed (for wheezing/shortness of breath).) 1 Inhaler 1   ALPRAZolam (XANAX) 1 MG tablet Take 1 mg by mouth 3 (three) times daily.  apixaban (ELIQUIS) 5 MG TABS tablet Take 1 tablet (5 mg total) by mouth 2 (two) times daily. 180 tablet 3   atorvastatin (LIPITOR) 80 MG tablet Take 1 tablet (80 mg total) by mouth daily. (Patient taking differently: Take 80 mg by mouth every evening.) 90 tablet 3   cyclobenzaprine (FLEXERIL) 5 MG tablet Take 1 tablet (5 mg total) by mouth at bedtime. (Patient taking differently: Take 5 mg by mouth at bedtime as needed for muscle spasms.) 30 tablet 0   diphenhydrAMINE HCl, Sleep, (ZZZQUIL PO) Take 1 tablet by mouth every other day.     docusate sodium (COLACE) 100 MG capsule Take 1 capsule (100 mg total) by mouth every 12 (twelve) hours. 60 capsule 0   hydroxypropyl methylcellulose / hypromellose (ISOPTO TEARS / GONIOVISC) 2.5 % ophthalmic solution Place 1 drop into both eyes 3  (three) times daily as needed for dry eyes.     MELATONIN PO Take 1 tablet by mouth every other day.     metoprolol succinate (TOPROL-XL) 25 MG 24 hr tablet Take 1 tablet (25 mg total) by mouth daily. 90 tablet 3   ondansetron (ZOFRAN ODT) 4 MG disintegrating tablet Take 1 tablet (4 mg total) by mouth every 8 (eight) hours as needed for nausea or vomiting. 10 tablet 0   polyethylene glycol (MIRALAX / GLYCOLAX) 17 g packet Take 17 g by mouth 2 (two) times daily. 20 each 0   potassium chloride SA (K-DUR,KLOR-CON) 20 MEQ tablet Take 1 tablet (20 mEq total) by mouth daily. 30 tablet 1   torsemide (DEMADEX) 20 MG tablet Take 1 tablet (20 mg total) by mouth daily. 30 tablet 2   No current facility-administered medications for this visit.     Past Surgical History:  Procedure Laterality Date   ANTERIOR CERVICAL DECOMP/DISCECTOMY FUSION     BACK SURGERY     BREAST BIOPSY     left-non cancerous   CARDIAC CATHETERIZATION     CARPAL TUNNEL RELEASE Left    CATARACT EXTRACTION W/PHACO  09/21/2011   Procedure: CATARACT EXTRACTION PHACO AND INTRAOCULAR LENS PLACEMENT (IOC);  Surgeon: Williams Che, MD;  Location: [facility];  Service: Ophthalmology;  Laterality: Left;  CDE:9.78   CATARACT EXTRACTION W/PHACO Right 10/15/2014   Procedure: CATARACT EXTRACTION PHACO AND INTRAOCULAR LENS PLACEMENT (IOC);  Surgeon: Williams Che, MD;  Location: [facility];  Service: Ophthalmology;  Laterality: Right;  CDE:4.19   [place]; 1972   CHOLECYSTECTOMY OPEN     COLONOSCOPY  2006   RMR: 1. Internal hemorroids, otherwise normal rectum. 2. Pedunculated polyp at 35 cm. reomved with snare. The remainder of teh colonic mucosa appeared normal.    COLONOSCOPY  2011   Dr. Gala Romney: multiple ascending colon polyps and rectal polyp, adenomatous   COLONOSCOPY N/A 05/31/2014   Procedure: COLONOSCOPY;  Surgeon: Daneil Dolin, MD;  Location: [facility];  Service: Endoscopy;  Laterality: N/A;  130   COLONOSCOPY WITH  PROPOFOL N/A 06/24/2017   Procedure: COLONOSCOPY WITH PROPOFOL;  Surgeon: Daneil Dolin, MD;  Location: [facility];  Service: Endoscopy;  Laterality: N/A;  2:15pm   ESOPHAGOGASTRODUODENOSCOPY  2006   Dr. Gala Romney: Subtle Schatzkis ring, otherwise normal upper GI tract, aside from a small pyloric channell erosion, status post dilation as described above.    FLEXIBLE SIGMOIDOSCOPY N/A 11/11/2017   Procedure: FLEXIBLE SIGMOIDOSCOPY;  Surgeon: Otis Brace, MD;  Location: [place];  Service: Gastroenterology;  Laterality: N/A;   JOINT REPLACEMENT  LUMBAR DISC SURGERY  X 2   POLYPECTOMY  06/24/2017   Procedure: POLYPECTOMY;  Surgeon: Daneil Dolin, MD;  Location: [facility];  Service: Endoscopy;;  ascending   POSTERIOR LUMBAR FUSION  ? date 1st fusion ; 07/2006   previous L4-5 Ray threaded fusion cage; Exploration of L4-5 fusion & PLIF 07/22/2006/notes 07/22/2006   TONSILLECTOMY     TOTAL KNEE ARTHROPLASTY Right 05/24/2013   Procedure: TOTAL KNEE ARTHROPLASTY;  Surgeon: [name], MD;  Location: [place];  Service: Orthopedics;  Laterality: Right;   TOTAL KNEE ARTHROPLASTY Left    VAGINAL HYSTERECTOMY       Allergies  Allergen Reactions   Penicillins Hives, Itching and Rash    Has patient had a PCN reaction causing immediate rash, facial/tongue/throat swelling, SOB or lightheadedness with hypotension: Yes Has patient had a PCN reaction causing severe rash involving mucus membranes or skin necrosis: Yes Has patient had a PCN reaction that required hospitalization: No Has patient had a PCN reaction occurring within the last 10 years: No If all of the above answers are "NO", then may proceed with Cephalosporin use.    Sulfa Antibiotics Swelling    Tongue swelling      Family History  Problem Relation Age of Onset   Cancer Other    Diabetes Other    Heart disease Other    Hypertension Other    Asthma Other    Alcoholism Other    Thyroid disease Other    Rheumatologic  disease Other    Stroke Other    Hypercholesterolemia Other    Colon cancer Neg Hx      Social History Ms. Kary reports that she quit smoking about 33 years ago. Her smoking use included cigarettes. She has a 5.00 pack-year smoking history. She has never used smokeless tobacco. Ms. Bouchillon reports previous alcohol use.   Review of Systems CONSTITUTIONAL: No weight loss, fever, chills, weakness or fatigue.  HEENT: Eyes: No visual loss, blurred vision, double vision or yellow sclerae.No hearing loss, sneezing, congestion, runny nose or sore throat.  SKIN: No rash or itching.  CARDIOVASCULAR:  RESPIRATORY: No shortness of breath, cough or sputum.  GASTROINTESTINAL: No anorexia, nausea, vomiting or diarrhea. No abdominal pain or blood.  GENITOURINARY: No burning on urination, no polyuria NEUROLOGICAL: No headache, dizziness, syncope, paralysis, ataxia, numbness or tingling in the extremities. No change in bowel or bladder control.  MUSCULOSKELETAL: No muscle, back pain, joint pain or stiffness.  LYMPHATICS: No enlarged nodes. No history of splenectomy.  PSYCHIATRIC: No history of depression or anxiety.  ENDOCRINOLOGIC: No reports of sweating, cold or heat intolerance. No polyuria or polydipsia.  Marland Kitchen   Physical Examination There were no vitals filed for this visit. There were no vitals filed for this visit.  Gen: resting comfortably, no acute distress HEENT: no scleral icterus, pupils equal round and reactive, no palptable cervical adenopathy,  CV Resp: Clear to auscultation bilaterally GI: abdomen is soft, non-tender, non-distended, normal bowel sounds, no hepatosplenomegaly MSK: extremities are warm, no edema.  Skin: warm, no rash Neuro:  no focal deficits Psych: appropriate affect   Diagnostic Studies  10/2011 cath HEMODYNAMICS: Aortic systolic pressure: 470. Aortic diastolic pressure: 78. Left ventricular systolic pressure: 962. Left ventricular diastolic pressure:  18. CORONARY ANGIOGRAPHY: 1. Left main: The left main normal. 2. LAD: The LAD had a 83%-66% mid systolic bridge after a diagonal Manveer Gomes. 3. Left circumflex coronary artery: Free of significant disease. 4. Right coronary artery: This a large  dominant vessel which was free of significant disease. LEFT VENTRICULOGRAPHY: The RAO left ventriculogram was performed using 25 cc of Omnipaque dye at 12 cc per second. The overall LVEF was estimated at greater than 60% without focal wall motion abnormality. 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 I suspect that her chest pain is related to cholelithiasis. The sheath was removed and pressure held on the groin to achieve hemostasis. The patient left the cath lab in stable condition. DISPOSITION: She will be discharged home today to be followed as an outpatient. She will see me back in the office in several weeks. She is cleared to undergo an elective cholecystectomy, at a low cardiovascular risk. She left the laboratory in stable condition. Dr. Octavio Graves was notified of these results.     12/2014 echo ------------------------------------------------------------------- Study Conclusions   - Left ventricle: The cavity size was normal. Wall thickness was   normal. Systolic function was vigorous. The estimated ejection   fraction was in the range of 65% to 70%. Doppler parameters are   consistent with abnormal left ventricular relaxation (grade 1   diastolic dysfunction). - Left atrium: The atrium was mildly dilated.     06/2017 echo Study Conclusions   - Left ventricle: The cavity size was normal. Wall thickness was   increased in a pattern of mild LVH. Systolic function was normal.   The estimated ejection fraction was in the range of 60% to 65%.   Wall motion was normal; there were  no regional wall motion   abnormalities. Doppler parameters are consistent with abnormal   left ventricular relaxation (grade 1 diastolic dysfunction). - Mitral valve: Mildly calcified annulus. There was trivial   regurgitation. - Right atrium: Central venous pressure (est): 3 mm Hg. - Atrial septum: No defect or patent foramen ovale was identified. - Tricuspid valve: There was trivial regurgitation. - Pulmonary arteries: PA peak pressure: 7 mm Hg (S). - Pericardium, extracardiac: There was no pericardial effusion.   Assessment and Plan  1. HTN - elevated but has run out of home bp meds, we will refill   2. Chest pain - long history with prior negative ischemic evaluations.  - chronic stable symptoms, continue to monitor.    3. Hyperlipidemia -continue statin, request labs from pcp   EKG today shows SR, no ischemic changes   4. OSA - prior hsitory, has not been wearing her cpap. Has not had repeat eval in seveal years - refer to pulmonary   5. Aflutter - cotninue current meds, no significant symptoms      Arnoldo Lenis, M.D., F.A.C.C.

## 2020-09-23 ENCOUNTER — Ambulatory Visit: Payer: Medicare Other | Admitting: Cardiology

## 2022-05-28 ENCOUNTER — Encounter: Payer: Self-pay | Admitting: *Deleted

## 2023-05-12 ENCOUNTER — Emergency Department (HOSPITAL_COMMUNITY)

## 2023-05-12 ENCOUNTER — Emergency Department (HOSPITAL_COMMUNITY)
Admission: EM | Admit: 2023-05-12 | Discharge: 2023-05-13 | Disposition: A | Discharge status: Home / Self Care | Attending: Emergency Medicine | Admitting: Emergency Medicine

## 2023-05-12 ENCOUNTER — Other Ambulatory Visit: Payer: Self-pay

## 2023-05-12 DIAGNOSIS — N3000 Acute cystitis without hematuria: Secondary | ICD-10-CM | POA: Insufficient documentation

## 2023-05-12 DIAGNOSIS — M7989 Other specified soft tissue disorders: Secondary | ICD-10-CM | POA: Diagnosis present

## 2023-05-12 DIAGNOSIS — E039 Hypothyroidism, unspecified: Secondary | ICD-10-CM | POA: Insufficient documentation

## 2023-05-12 DIAGNOSIS — Z7901 Long term (current) use of anticoagulants: Secondary | ICD-10-CM | POA: Insufficient documentation

## 2023-05-12 DIAGNOSIS — Z79899 Other long term (current) drug therapy: Secondary | ICD-10-CM | POA: Diagnosis not present

## 2023-05-12 DIAGNOSIS — I5030 Unspecified diastolic (congestive) heart failure: Secondary | ICD-10-CM | POA: Diagnosis not present

## 2023-05-12 DIAGNOSIS — R202 Paresthesia of skin: Secondary | ICD-10-CM | POA: Insufficient documentation

## 2023-05-12 DIAGNOSIS — I130 Hypertensive heart and chronic kidney disease with heart failure and stage 1 through stage 4 chronic kidney disease, or unspecified chronic kidney disease: Secondary | ICD-10-CM | POA: Diagnosis not present

## 2023-05-12 DIAGNOSIS — R0600 Dyspnea, unspecified: Secondary | ICD-10-CM | POA: Insufficient documentation

## 2023-05-12 DIAGNOSIS — R079 Chest pain, unspecified: Secondary | ICD-10-CM | POA: Diagnosis not present

## 2023-05-12 DIAGNOSIS — N182 Chronic kidney disease, stage 2 (mild): Secondary | ICD-10-CM | POA: Insufficient documentation

## 2023-05-12 LAB — COMPREHENSIVE METABOLIC PANEL
ALT: 6 U/L (ref 0–44)
AST: 9 U/L — ABNORMAL LOW (ref 15–41)
Albumin: 3.1 g/dL — ABNORMAL LOW (ref 3.5–5.0)
Alkaline Phosphatase: 95 U/L (ref 38–126)
Anion gap: 9 (ref 5–15)
BUN: 17 mg/dL (ref 8–23)
CO2: 32 mmol/L (ref 22–32)
Calcium: 8.9 mg/dL (ref 8.9–10.3)
Chloride: 98 mmol/L (ref 98–111)
Creatinine, Ser: 1.3 mg/dL — ABNORMAL HIGH (ref 0.44–1.00)
GFR, Estimated: 42 mL/min — ABNORMAL LOW (ref >60)
Glucose, Bld: 102 mg/dL — ABNORMAL HIGH (ref 70–99)
Potassium: 4.3 mmol/L (ref 3.5–5.1)
Sodium: 139 mmol/L (ref 135–145)
Total Bilirubin: 0.4 mg/dL (ref 0.0–1.2)
Total Protein: 7.4 g/dL (ref 6.5–8.1)

## 2023-05-12 LAB — CBC WITH DIFFERENTIAL/PLATELET
Abs Immature Granulocytes: 0.01 10*3/uL (ref 0.00–0.07)
Basophils Absolute: 0 10*3/uL (ref 0.0–0.1)
Basophils Relative: 0 %
Eosinophils Absolute: 0.4 10*3/uL (ref 0.0–0.5)
Eosinophils Relative: 6 %
HCT: 46.6 % — ABNORMAL HIGH (ref 36.0–46.0)
Hemoglobin: 14.5 g/dL (ref 12.0–15.0)
Immature Granulocytes: 0 %
Lymphocytes Relative: 39 %
Lymphs Abs: 2.6 10*3/uL (ref 0.7–4.0)
MCH: 32.4 pg (ref 26.0–34.0)
MCHC: 31.1 g/dL (ref 30.0–36.0)
MCV: 104 fL — ABNORMAL HIGH (ref 80.0–100.0)
Monocytes Absolute: 0.6 10*3/uL (ref 0.1–1.0)
Monocytes Relative: 9 %
Neutro Abs: 3 10*3/uL (ref 1.7–7.7)
Neutrophils Relative %: 46 %
Platelets: 218 10*3/uL (ref 150–400)
RBC: 4.48 MIL/uL (ref 3.87–5.11)
RDW: 15.1 % (ref 11.5–15.5)
WBC: 6.7 10*3/uL (ref 4.0–10.5)
nRBC: 0 % (ref 0.0–0.2)

## 2023-05-12 LAB — BRAIN NATRIURETIC PEPTIDE: B Natriuretic Peptide: 60 pg/mL (ref 0.0–100.0)

## 2023-05-12 LAB — URINALYSIS, ROUTINE W REFLEX MICROSCOPIC
Bilirubin Urine: NEGATIVE
Glucose, UA: NEGATIVE mg/dL
Hgb urine dipstick: NEGATIVE
Ketones, ur: NEGATIVE mg/dL
Nitrite: NEGATIVE
Protein, ur: 30 mg/dL — AB
Specific Gravity, Urine: 1.019 (ref 1.005–1.030)
WBC, UA: >50 WBC/hpf (ref 0–5)
pH: 5 (ref 5.0–8.0)

## 2023-05-12 LAB — D-DIMER, QUANTITATIVE: D-Dimer, Quant: 1.07 ug{FEU}/mL — ABNORMAL HIGH (ref 0.00–0.50)

## 2023-05-12 MED ORDER — IOHEXOL 350 MG/ML SOLN
60.0000 mL | Freq: Once | INTRAVENOUS | Status: AC | PRN
  Administered 2023-05-12: 60 mL via INTRAVENOUS

## 2023-05-12 NOTE — ED Provider Notes (Signed)
 [place] EMERGENCY DEPARTMENT AT [facility] Provider Note   CSN: [id number] Arrival date & time: 05/12/23  1850     History {Add pertinent medical, surgical, social history, OB history to HPI:1} Chief Complaint  Patient presents with   Leg Swelling    Holly Flores is a 78 y.o. female with PMH as listed below who arrives POV c/o RLE swelling and possible concern for a new blood clot. Pt reports previous stroke and has lost full mobility on the right side of her body. Pt reports she has a tender knot in her right calf of her leg and discoloration in her right foot.    Past Medical History:  Diagnosis Date   Anxiety    Arthritis    "all over" (12/20/2017)   Atrial flutter (HCC)    Central retinal vein occlusion of left eye 01/22/2015   Chronic lower back pain    CKD (chronic kidney disease), stage II    CVA (cerebral vascular accident) (HCC) 12/30/2014   OCULAR  LEFT  EYE    Depression    Diastolic CHF (HCC)    Fibromyalgia    GERD (gastroesophageal reflux disease)    Goiter    History of colonic polyps 05/14/2014   Hyperlipidemia    Hypertension    Hypothyroidism    Kidney cysts    left side   Left middle cerebral artery stroke (HCC) 11/01/2017   "affected my right side"   Migraine    "couple times/wk; been having them for a long time" (12/20/2017)   Myocardial infarction [facility])    "one dr said I did; one said I didn't;  don't remember when it was" (12/20/2017)   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 Authorizing Provider  albuterol (PROVENTIL HFA;VENTOLIN HFA) 108 (90 Base) MCG/ACT inhaler Inhale 2 puffs into the lungs every 6 (six) hours as needed (for wheezing/shortness of  breath). Patient taking differently: Inhale 2 puffs into the lungs every 4 (four) hours as needed (for wheezing/shortness of breath). 12/08/17   Angiulli, Mcarthur Rossetti, PA-C  ALPRAZolam Prudy Feeler) 1 MG tablet Take 1 mg by mouth 3 (three) times daily.    [provider]  apixaban (ELIQUIS) 5 MG TABS tablet Take 1 tablet (5 mg total) by mouth 2 (two) times daily. 10/30/19   Antoine Poche, MD  atorvastatin (LIPITOR) 80 MG tablet Take 1 tablet (80 mg total) by mouth daily. Patient taking differently: Take 80 mg by mouth every evening. 10/30/19 01/28/20  Antoine Poche, MD  cyclobenzaprine (FLEXERIL) 5 MG tablet Take 1 tablet (5 mg total) by mouth at bedtime. Patient taking differently: Take 5 mg by mouth at bedtime as needed for muscle spasms. 12/08/17   Angiulli, Mcarthur Rossetti, PA-C  diphenhydrAMINE HCl, Sleep, (ZZZQUIL PO) Take 1 tablet by mouth every other day.    [provider]  docusate sodium (COLACE) 100 MG capsule Take 1 capsule (100 mg total) by mouth every 12 (twelve) hours. 04/06/20   Pricilla Loveless, MD  hydroxypropyl methylcellulose / hypromellose (ISOPTO TEARS / GONIOVISC) 2.5 % ophthalmic solution Place 1 drop into both eyes 3 (three) times daily as needed for dry  eyes.    [provider]  MELATONIN PO Take 1 tablet by mouth every other day.    [provider]  metoprolol succinate (TOPROL-XL) 25 MG 24 hr tablet Take 1 tablet (25 mg total) by mouth daily. 10/30/19   Antoine Poche, MD  ondansetron (ZOFRAN ODT) 4 MG disintegrating tablet Take 1 tablet (4 mg total) by mouth every 8 (eight) hours as needed for nausea or vomiting. 04/06/20   Pricilla Loveless, MD  polyethylene glycol (MIRALAX / GLYCOLAX) 17 g packet Take 17 g by mouth 2 (two) times daily. 04/06/20   Pricilla Loveless, MD  potassium chloride SA (K-DUR,KLOR-CON) 20 MEQ tablet Take 1 tablet (20 mEq total) by mouth daily. 12/08/17   Angiulli, Mcarthur Rossetti, PA-C  torsemide (DEMADEX) 20 MG tablet Take 1  tablet (20 mg total) by mouth daily. 01/09/20   Antoine Poche, MD      Allergies    Penicillins and Sulfa antibiotics    Review of Systems   Review of Systems A 10 point review of systems was performed and is negative unless otherwise reported in HPI.  Physical Exam Updated Vital Signs BP (!) 125/112   Pulse 77   Temp 98.7 F (37.1 C) (Oral)   Resp 18   Ht 5\' 1"  (1.549 m)   Wt 105 kg   SpO2 90%   BMI 43.74 kg/m  Physical Exam General: Normal appearing {Desc; female/female:11659}, lying in bed.  HEENT: PERRLA, Sclera anicteric, MMM, trachea midline.  Cardiology: RRR, no murmurs/rubs/gallops. BL radial and DP pulses equal bilaterally.  Resp: Normal respiratory rate and effort. CTAB, no wheezes, rhonchi, crackles.  Abd: Soft, non-tender, non-distended. No rebound tenderness or guarding.  GU: Deferred. MSK: No peripheral edema or signs of trauma. Extremities without deformity or TTP. No cyanosis or clubbing. Skin: warm, dry. No rashes or lesions. Back: No CVA tenderness Neuro: A&Ox4, CNs II-XII grossly intact. MAEs. Sensation grossly intact.  Psych: Normal mood and affect.   ED Results / Procedures / Treatments   Labs (all labs ordered are listed, but only abnormal results are displayed) Labs Reviewed - No data to display  EKG None  Radiology No results found.  Procedures Procedures  {Document cardiac monitor, telemetry assessment procedure when appropriate:1}  Medications Ordered in ED Medications - No data to display  ED Course/ Medical Decision Making/ A&P                          Medical Decision Making Amount and/or Complexity of Data Reviewed Labs: ordered. Decision-making details documented in ED Course. Radiology: ordered. Decision-making details documented in ED Course.  Risk Prescription drug management.    This patient presents to the ED for concern of ***, this involves an extensive number of treatment options, and is a complaint that  carries with it a high risk of complications and morbidity.  I considered the following differential and admission for this acute, potentially life threatening condition.   MDM:    ***  Clinical Course as of 05/16/23 2238  Wed May 12, 2023  2114 D-Dimer, Quant(!): 1.07 Elevated d-dimer [HN]  2338 CT Angio Chest PE W and/or Wo Contrast 1. No evidence for pulmonary embolism. 2. Cardiomegaly. 3. Central patchy ground-glass opacities bilaterally, right greater than left, worrisome for pulmonary edema. 4. Incidental left thyroid nodule with heterogeneous and enlarged thyroid measuring 3 cm. Recommend non-emergent thyroid ultrasound. Reference: J Am Coll Radiol. 2015 Feb;12(2): 143-50   [HN]  2338 Urinalysis, Routine w reflex microscopic -Urine, Clean Catch(!) +UTI also present [HN]  Thu May 13, 2023  0014 BSRN obtained ABI 0.85 in the right foot, suggest outpatient vascular surgery follow up. Will get official ABI in the AM as well as DVT study in the AM. [HN]  0016 Also noted to have UTI. Will give keflex. Has listed allergy to penicillins but has tolerated ceftriaxone and keflex in the past. [HN]  0032 States her breathing is the same as it always is. Has had some cough but no fevers, unlikely PNA and no leukocytosis. She doesn't feel short of breath or have chest pain now. Wears 2L O2 at night. Advised to closely monitor her breathing and f/u about this with her primary care physician. [HN]    Clinical Course User Index [HN] Loetta Rough, MD    Labs: I Ordered, and personally interpreted labs.  The pertinent results include:  ***  Imaging Studies ordered: I ordered imaging studies including *** I independently visualized and interpreted imaging. I agree with the radiologist interpretation  Additional history obtained from ***.  External records from outside source obtained and reviewed including ***  Cardiac Monitoring: The patient was maintained on a cardiac monitor.  I  personally viewed and interpreted the cardiac monitored which showed an underlying rhythm of: ***  Reevaluation: After the interventions noted above, I reevaluated the patient and found that they have :{resolved/improved/worsened:23923::"improved"}  Social Determinants of Health: ***  Disposition:  ***  Co morbidities that complicate the patient evaluation  Past Medical History:  Diagnosis Date   Anxiety    Arthritis    "all over" (12/20/2017)   Atrial flutter (HCC)    Central retinal vein occlusion of left eye 01/22/2015   Chronic lower back pain    CKD (chronic kidney disease), stage II    CVA (cerebral vascular accident) (HCC) 12/30/2014   OCULAR  LEFT  EYE    Depression    Diastolic CHF (HCC)    Fibromyalgia    GERD (gastroesophageal reflux disease)    Goiter    History of colonic polyps 05/14/2014   Hyperlipidemia    Hypertension    Hypothyroidism    Kidney cysts    left side   Left middle cerebral artery stroke (HCC) 11/01/2017   "affected my right side"   Migraine    "couple times/wk; been having them for a long time" (12/20/2017)   Myocardial infarction [facility])    "one dr said I did; one said I didn't;  don't remember when it was" (12/20/2017)   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   I have reviewed the patients home medicines and have made adjustments as needed  Problem List / ED Course: Problem List Items Addressed This Visit   None        {Document critical care time when appropriate:1} {Document review of labs and clinical decision tools ie heart score, Chads2Vasc2 etc:1}  {Document your independent review of radiology images, and any outside records:1} {Document your discussion with family members,  caretakers, and with consultants:1} {Document social determinants of health affecting pt's care:1} {Document your decision making why or why not admission, treatments were needed:1}  This  note was created using dictation software, which may contain spelling or grammatical errors.

## 2023-05-12 NOTE — ED Triage Notes (Signed)
 Pt arrived via POV c/o RLE swelling and possible concern for a new blood clot. Pt reports previous stroke and has lost full mobility on the right side of her body. Pt reports she has a tender knot in her right calf of her leg and discoloration in her right foot.

## 2023-05-13 DIAGNOSIS — M7989 Other specified soft tissue disorders: Secondary | ICD-10-CM | POA: Diagnosis not present

## 2023-05-13 MED ORDER — CEPHALEXIN 250 MG PO CAPS
250.0000 mg | ORAL_CAPSULE | Freq: Once | ORAL | Status: AC
  Administered 2023-05-13: 250 mg via ORAL
  Filled 2023-05-13 (×2): qty 1

## 2023-05-13 MED ORDER — CEPHALEXIN 250 MG PO CAPS: 250.0000 mg | ORAL_CAPSULE | Freq: Four times a day (QID) | ORAL | 0 refills | Status: AC

## 2023-05-13 NOTE — Discharge Instructions (Addendum)
 Thank you for coming to [facility] Emergency Department. You were seen for leg swelling and pain. We did an exam, labs, and imaging, and these showed a urinary tract infection which we will prescribe keflex 250 mg every 6 hours for 7 days.   Please return to the emergency department tomorrow morning for a vascular ultrasound of the right leg. You return to the front of the emergency department and say that you are here for an ultrasound. The order has been placed.   You can take tylenol 1,000 mg every 8 hours for pain.   Please follow up with your primary care provider within 1 week.   Do not hesitate to return to the ED or call 911 if you experience: -Worsening symptoms -Numbness/tingling -Lightheadedness, passing out -Fevers/chills -Anything else that concerns you
